# Patient Record
Sex: Female | Born: 1960 | Race: White | Hispanic: No
Health system: Midwestern US, Community
[De-identification: ages and names within clinical notes are randomized; demographics above are authoritative.]

## PROBLEM LIST (undated history)

## (undated) DIAGNOSIS — M19011 Primary osteoarthritis, right shoulder: Secondary | ICD-10-CM

## (undated) DIAGNOSIS — M26629 Arthralgia of temporomandibular joint, unspecified side: Secondary | ICD-10-CM

## (undated) DIAGNOSIS — K219 Gastro-esophageal reflux disease without esophagitis: Secondary | ICD-10-CM

## (undated) DIAGNOSIS — Z8711 Personal history of peptic ulcer disease: Secondary | ICD-10-CM

## (undated) DIAGNOSIS — Z8489 Family history of other specified conditions: Secondary | ICD-10-CM

## (undated) DIAGNOSIS — Z8719 Personal history of other diseases of the digestive system: Secondary | ICD-10-CM

## (undated) DIAGNOSIS — F32A Depression, unspecified: Secondary | ICD-10-CM

## (undated) DIAGNOSIS — I38 Endocarditis, valve unspecified: Secondary | ICD-10-CM

## (undated) DIAGNOSIS — R569 Unspecified convulsions: Secondary | ICD-10-CM

## (undated) DIAGNOSIS — C539 Malignant neoplasm of cervix uteri, unspecified: Secondary | ICD-10-CM

## (undated) DIAGNOSIS — K648 Other hemorrhoids: Secondary | ICD-10-CM

## (undated) DIAGNOSIS — D241 Benign neoplasm of right breast: Secondary | ICD-10-CM

## (undated) DIAGNOSIS — J449 Chronic obstructive pulmonary disease, unspecified: Secondary | ICD-10-CM

## (undated) DIAGNOSIS — I10 Essential (primary) hypertension: Secondary | ICD-10-CM

## (undated) DIAGNOSIS — M2560 Stiffness of unspecified joint, not elsewhere classified: Secondary | ICD-10-CM

## (undated) DIAGNOSIS — J189 Pneumonia, unspecified organism: Secondary | ICD-10-CM

## (undated) DIAGNOSIS — M19012 Primary osteoarthritis, left shoulder: Secondary | ICD-10-CM

## (undated) DIAGNOSIS — R413 Other amnesia: Secondary | ICD-10-CM

## (undated) DIAGNOSIS — F419 Anxiety disorder, unspecified: Secondary | ICD-10-CM

## (undated) DIAGNOSIS — Z972 Presence of dental prosthetic device (complete) (partial): Secondary | ICD-10-CM

## (undated) DIAGNOSIS — R1310 Dysphagia, unspecified: Secondary | ICD-10-CM

## (undated) DIAGNOSIS — M797 Fibromyalgia: Secondary | ICD-10-CM

## (undated) DIAGNOSIS — R011 Cardiac murmur, unspecified: Secondary | ICD-10-CM

## (undated) DIAGNOSIS — K589 Irritable bowel syndrome without diarrhea: Secondary | ICD-10-CM

## (undated) DIAGNOSIS — F329 Major depressive disorder, single episode, unspecified: Secondary | ICD-10-CM

## (undated) DIAGNOSIS — G8929 Other chronic pain: Secondary | ICD-10-CM

## (undated) DIAGNOSIS — R198 Other specified symptoms and signs involving the digestive system and abdomen: Secondary | ICD-10-CM

## (undated) DIAGNOSIS — M549 Dorsalgia, unspecified: Secondary | ICD-10-CM

## (undated) HISTORY — DX: Gastro-esophageal reflux disease without esophagitis: K21.9

## (undated) HISTORY — DX: Endocarditis, valve unspecified: I38

## (undated) HISTORY — DX: Essential (primary) hypertension: I10

## (undated) HISTORY — DX: Other chronic pain: G89.29

## (undated) HISTORY — PX: NM MYOVIEW LTD: HXRAD82

## (undated) HISTORY — DX: Chronic obstructive pulmonary disease, unspecified: J44.9

## (undated) HISTORY — DX: Depression, unspecified: F32.A

## (undated) HISTORY — DX: Major depressive disorder, single episode, unspecified: F32.9

## (undated) HISTORY — DX: Fibromyalgia: M79.7

## (undated) HISTORY — DX: Irritable bowel syndrome, unspecified: K58.9

## (undated) HISTORY — DX: Anxiety disorder, unspecified: F41.9

## (undated) HISTORY — DX: Dorsalgia, unspecified: M54.9

## (undated) HISTORY — PX: BREAST BIOPSY: SHX20

## (undated) HISTORY — PX: ABDOMINAL HYSTERECTOMY: SHX81

## (undated) HISTORY — DX: Benign neoplasm of right breast: D24.1

---

## 1987-11-10 HISTORY — PX: BREAST LUMPECTOMY: SHX2

## 1993-11-09 HISTORY — PX: CARDIAC CATHETERIZATION: SHX172

## 1999-04-03 ENCOUNTER — Ambulatory Visit (HOSPITAL_COMMUNITY): Admission: RE | Admit: 1999-04-03 | Discharge: 1999-04-03 | Payer: Self-pay | Admitting: Neurosurgery

## 1999-04-03 ENCOUNTER — Encounter: Payer: Self-pay | Admitting: Neurosurgery

## 2002-05-11 ENCOUNTER — Encounter: Payer: Self-pay | Admitting: Internal Medicine

## 2004-10-28 ENCOUNTER — Ambulatory Visit: Payer: Self-pay | Admitting: Internal Medicine

## 2004-11-04 ENCOUNTER — Ambulatory Visit: Payer: Self-pay | Admitting: Internal Medicine

## 2004-11-04 ENCOUNTER — Encounter: Payer: Self-pay | Admitting: Cardiology

## 2004-11-05 ENCOUNTER — Ambulatory Visit: Payer: Self-pay | Admitting: Internal Medicine

## 2004-12-16 ENCOUNTER — Ambulatory Visit: Payer: Self-pay | Admitting: Internal Medicine

## 2004-12-26 ENCOUNTER — Ambulatory Visit: Payer: Self-pay | Admitting: Internal Medicine

## 2004-12-29 ENCOUNTER — Ambulatory Visit: Payer: Self-pay | Admitting: Internal Medicine

## 2004-12-30 ENCOUNTER — Encounter: Admission: RE | Admit: 2004-12-30 | Discharge: 2004-12-30 | Payer: Self-pay | Admitting: Internal Medicine

## 2005-01-12 ENCOUNTER — Ambulatory Visit (HOSPITAL_COMMUNITY): Admission: RE | Admit: 2005-01-12 | Discharge: 2005-01-12 | Payer: Self-pay | Admitting: Internal Medicine

## 2005-02-04 ENCOUNTER — Ambulatory Visit: Payer: Self-pay | Admitting: Internal Medicine

## 2005-03-16 ENCOUNTER — Encounter: Payer: Self-pay | Admitting: Internal Medicine

## 2005-03-16 ENCOUNTER — Ambulatory Visit: Payer: Self-pay | Admitting: Internal Medicine

## 2005-03-17 ENCOUNTER — Ambulatory Visit: Payer: Self-pay | Admitting: Internal Medicine

## 2005-06-19 ENCOUNTER — Ambulatory Visit: Payer: Self-pay | Admitting: Internal Medicine

## 2005-09-28 ENCOUNTER — Ambulatory Visit: Payer: Self-pay | Admitting: Internal Medicine

## 2006-03-01 ENCOUNTER — Ambulatory Visit: Payer: Self-pay | Admitting: Internal Medicine

## 2006-03-19 ENCOUNTER — Ambulatory Visit: Payer: Self-pay | Admitting: Internal Medicine

## 2006-03-25 ENCOUNTER — Ambulatory Visit: Payer: Self-pay | Admitting: Internal Medicine

## 2006-05-18 ENCOUNTER — Ambulatory Visit: Payer: Self-pay | Admitting: Internal Medicine

## 2006-05-25 ENCOUNTER — Ambulatory Visit: Payer: Self-pay | Admitting: General Practice

## 2006-05-26 ENCOUNTER — Ambulatory Visit: Payer: Self-pay | Admitting: Internal Medicine

## 2006-06-01 ENCOUNTER — Encounter: Payer: Self-pay | Admitting: Cardiology

## 2006-06-01 ENCOUNTER — Ambulatory Visit: Payer: Self-pay | Admitting: Internal Medicine

## 2006-07-15 ENCOUNTER — Encounter: Payer: Self-pay | Admitting: Internal Medicine

## 2006-07-29 ENCOUNTER — Ambulatory Visit: Payer: Self-pay | Admitting: Internal Medicine

## 2006-09-09 ENCOUNTER — Ambulatory Visit: Payer: Self-pay | Admitting: Internal Medicine

## 2006-09-13 ENCOUNTER — Ambulatory Visit: Payer: Self-pay | Admitting: Cardiology

## 2006-09-13 ENCOUNTER — Encounter: Payer: Self-pay | Admitting: Internal Medicine

## 2006-10-15 ENCOUNTER — Encounter: Admission: RE | Admit: 2006-10-15 | Discharge: 2006-10-15 | Payer: Self-pay | Admitting: Neurosurgery

## 2006-11-17 ENCOUNTER — Inpatient Hospital Stay (HOSPITAL_COMMUNITY): Admission: RE | Admit: 2006-11-17 | Discharge: 2006-11-22 | Payer: Self-pay | Admitting: Neurosurgery

## 2006-11-17 HISTORY — PX: LAMINECTOMY WITH POSTERIOR LATERAL ARTHRODESIS LEVEL 3: SHX6337

## 2006-11-17 HISTORY — PX: HARDWARE REMOVAL: SHX979

## 2007-03-08 ENCOUNTER — Encounter: Payer: Self-pay | Admitting: Internal Medicine

## 2007-05-31 ENCOUNTER — Telehealth (INDEPENDENT_AMBULATORY_CARE_PROVIDER_SITE_OTHER): Payer: Self-pay | Admitting: *Deleted

## 2007-07-14 ENCOUNTER — Telehealth: Payer: Self-pay | Admitting: Internal Medicine

## 2007-07-19 ENCOUNTER — Encounter: Payer: Self-pay | Admitting: Internal Medicine

## 2007-08-08 ENCOUNTER — Ambulatory Visit: Payer: Self-pay | Admitting: Internal Medicine

## 2007-08-08 DIAGNOSIS — K219 Gastro-esophageal reflux disease without esophagitis: Secondary | ICD-10-CM | POA: Insufficient documentation

## 2007-08-08 DIAGNOSIS — J449 Chronic obstructive pulmonary disease, unspecified: Secondary | ICD-10-CM | POA: Insufficient documentation

## 2007-08-08 DIAGNOSIS — F3289 Other specified depressive episodes: Secondary | ICD-10-CM | POA: Insufficient documentation

## 2007-08-08 DIAGNOSIS — F329 Major depressive disorder, single episode, unspecified: Secondary | ICD-10-CM | POA: Insufficient documentation

## 2007-08-08 DIAGNOSIS — G40909 Epilepsy, unspecified, not intractable, without status epilepticus: Secondary | ICD-10-CM | POA: Insufficient documentation

## 2007-08-08 DIAGNOSIS — F339 Major depressive disorder, recurrent, unspecified: Secondary | ICD-10-CM | POA: Insufficient documentation

## 2007-08-09 ENCOUNTER — Telehealth (INDEPENDENT_AMBULATORY_CARE_PROVIDER_SITE_OTHER): Payer: Self-pay | Admitting: *Deleted

## 2007-09-07 DIAGNOSIS — F39 Unspecified mood [affective] disorder: Secondary | ICD-10-CM | POA: Insufficient documentation

## 2007-09-07 DIAGNOSIS — H409 Unspecified glaucoma: Secondary | ICD-10-CM | POA: Insufficient documentation

## 2007-09-07 DIAGNOSIS — F17200 Nicotine dependence, unspecified, uncomplicated: Secondary | ICD-10-CM | POA: Insufficient documentation

## 2007-09-07 DIAGNOSIS — K649 Unspecified hemorrhoids: Secondary | ICD-10-CM | POA: Insufficient documentation

## 2007-09-07 DIAGNOSIS — K259 Gastric ulcer, unspecified as acute or chronic, without hemorrhage or perforation: Secondary | ICD-10-CM | POA: Insufficient documentation

## 2007-09-08 ENCOUNTER — Telehealth (INDEPENDENT_AMBULATORY_CARE_PROVIDER_SITE_OTHER): Payer: Self-pay | Admitting: *Deleted

## 2007-09-09 ENCOUNTER — Ambulatory Visit: Payer: Self-pay | Admitting: Internal Medicine

## 2007-09-14 ENCOUNTER — Telehealth (INDEPENDENT_AMBULATORY_CARE_PROVIDER_SITE_OTHER): Payer: Self-pay | Admitting: *Deleted

## 2007-09-14 ENCOUNTER — Other Ambulatory Visit: Payer: Self-pay

## 2007-09-14 ENCOUNTER — Emergency Department: Payer: Self-pay | Admitting: Internal Medicine

## 2007-09-30 ENCOUNTER — Telehealth (INDEPENDENT_AMBULATORY_CARE_PROVIDER_SITE_OTHER): Payer: Self-pay | Admitting: *Deleted

## 2007-10-03 ENCOUNTER — Ambulatory Visit: Payer: Self-pay | Admitting: Internal Medicine

## 2007-10-04 ENCOUNTER — Encounter: Payer: Self-pay | Admitting: Internal Medicine

## 2007-10-04 ENCOUNTER — Ambulatory Visit: Payer: Self-pay | Admitting: Internal Medicine

## 2007-10-04 LAB — CONVERTED CEMR LAB
AST: 23 units/L (ref 0–37)
Alkaline Phosphatase: 90 units/L (ref 39–117)
BUN: 13 mg/dL (ref 6–23)
Basophils Relative: 0 % (ref 0.0–1.0)
CO2: 29 meq/L (ref 19–32)
Calcium: 9.7 mg/dL (ref 8.4–10.5)
Chloride: 100 meq/L (ref 96–112)
Creatinine, Ser: 0.8 mg/dL (ref 0.4–1.2)
Eosinophils Relative: 5.9 % — ABNORMAL HIGH (ref 0.0–5.0)
GFR calc non Af Amer: 82 mL/min
Lymphocytes Relative: 28.8 % (ref 12.0–46.0)
MCHC: 35.4 g/dL (ref 30.0–36.0)
Monocytes Absolute: 1.3 10*3/uL — ABNORMAL HIGH (ref 0.2–0.7)
RDW: 11.9 % (ref 11.5–14.6)
Sodium: 140 meq/L (ref 135–145)
TSH: 1.78 microintl units/mL (ref 0.35–5.50)
Total Bilirubin: 0.5 mg/dL (ref 0.3–1.2)
Total Protein: 7.6 g/dL (ref 6.0–8.3)
WBC: 13.2 10*3/uL — ABNORMAL HIGH (ref 4.5–10.5)

## 2007-10-10 ENCOUNTER — Encounter (INDEPENDENT_AMBULATORY_CARE_PROVIDER_SITE_OTHER): Payer: Self-pay | Admitting: *Deleted

## 2007-11-02 ENCOUNTER — Telehealth (INDEPENDENT_AMBULATORY_CARE_PROVIDER_SITE_OTHER): Payer: Self-pay | Admitting: *Deleted

## 2007-11-21 ENCOUNTER — Telehealth (INDEPENDENT_AMBULATORY_CARE_PROVIDER_SITE_OTHER): Payer: Self-pay | Admitting: *Deleted

## 2007-11-25 ENCOUNTER — Telehealth: Payer: Self-pay | Admitting: Internal Medicine

## 2007-12-08 ENCOUNTER — Ambulatory Visit: Payer: Self-pay | Admitting: Internal Medicine

## 2007-12-18 ENCOUNTER — Encounter: Payer: Self-pay | Admitting: Internal Medicine

## 2007-12-26 ENCOUNTER — Telehealth (INDEPENDENT_AMBULATORY_CARE_PROVIDER_SITE_OTHER): Payer: Self-pay | Admitting: *Deleted

## 2007-12-30 ENCOUNTER — Telehealth (INDEPENDENT_AMBULATORY_CARE_PROVIDER_SITE_OTHER): Payer: Self-pay | Admitting: *Deleted

## 2008-01-02 ENCOUNTER — Telehealth: Payer: Self-pay | Admitting: Internal Medicine

## 2008-01-23 ENCOUNTER — Telehealth (INDEPENDENT_AMBULATORY_CARE_PROVIDER_SITE_OTHER): Payer: Self-pay | Admitting: *Deleted

## 2008-01-29 ENCOUNTER — Other Ambulatory Visit: Payer: Self-pay

## 2008-01-30 ENCOUNTER — Observation Stay: Payer: Self-pay | Admitting: Internal Medicine

## 2008-01-31 ENCOUNTER — Encounter: Payer: Self-pay | Admitting: Internal Medicine

## 2008-02-01 ENCOUNTER — Encounter
Admission: RE | Admit: 2008-02-01 | Discharge: 2008-02-01 | Payer: Self-pay | Admitting: Physical Medicine & Rehabilitation

## 2008-02-02 ENCOUNTER — Ambulatory Visit: Payer: Self-pay | Admitting: Internal Medicine

## 2008-02-13 ENCOUNTER — Telehealth (INDEPENDENT_AMBULATORY_CARE_PROVIDER_SITE_OTHER): Payer: Self-pay | Admitting: *Deleted

## 2008-02-14 ENCOUNTER — Ambulatory Visit: Payer: Self-pay | Admitting: Family Medicine

## 2008-02-28 ENCOUNTER — Telehealth (INDEPENDENT_AMBULATORY_CARE_PROVIDER_SITE_OTHER): Payer: Self-pay | Admitting: *Deleted

## 2008-03-05 ENCOUNTER — Telehealth (INDEPENDENT_AMBULATORY_CARE_PROVIDER_SITE_OTHER): Payer: Self-pay | Admitting: *Deleted

## 2008-03-16 ENCOUNTER — Encounter: Payer: Self-pay | Admitting: Internal Medicine

## 2008-03-20 ENCOUNTER — Telehealth (INDEPENDENT_AMBULATORY_CARE_PROVIDER_SITE_OTHER): Payer: Self-pay | Admitting: *Deleted

## 2008-03-29 ENCOUNTER — Encounter: Payer: Self-pay | Admitting: Internal Medicine

## 2008-03-30 ENCOUNTER — Emergency Department: Payer: Self-pay | Admitting: Emergency Medicine

## 2008-04-05 ENCOUNTER — Ambulatory Visit: Payer: Self-pay | Admitting: Internal Medicine

## 2008-04-14 ENCOUNTER — Encounter: Payer: Self-pay | Admitting: Internal Medicine

## 2008-04-16 ENCOUNTER — Encounter: Payer: Self-pay | Admitting: Internal Medicine

## 2008-04-19 ENCOUNTER — Telehealth: Payer: Self-pay | Admitting: Family Medicine

## 2008-05-16 ENCOUNTER — Telehealth (INDEPENDENT_AMBULATORY_CARE_PROVIDER_SITE_OTHER): Payer: Self-pay | Admitting: *Deleted

## 2008-05-21 ENCOUNTER — Telehealth (INDEPENDENT_AMBULATORY_CARE_PROVIDER_SITE_OTHER): Payer: Self-pay | Admitting: *Deleted

## 2008-05-22 ENCOUNTER — Telehealth (INDEPENDENT_AMBULATORY_CARE_PROVIDER_SITE_OTHER): Payer: Self-pay | Admitting: *Deleted

## 2008-05-30 ENCOUNTER — Ambulatory Visit: Payer: Self-pay | Admitting: Pain Medicine

## 2008-05-30 ENCOUNTER — Telehealth (INDEPENDENT_AMBULATORY_CARE_PROVIDER_SITE_OTHER): Payer: Self-pay | Admitting: *Deleted

## 2008-06-02 ENCOUNTER — Ambulatory Visit: Payer: Self-pay | Admitting: Pain Medicine

## 2008-06-04 ENCOUNTER — Ambulatory Visit: Payer: Self-pay | Admitting: Pain Medicine

## 2008-06-26 ENCOUNTER — Telehealth: Payer: Self-pay | Admitting: Internal Medicine

## 2008-06-27 ENCOUNTER — Telehealth: Payer: Self-pay | Admitting: Internal Medicine

## 2008-06-27 ENCOUNTER — Ambulatory Visit: Payer: Self-pay | Admitting: Pain Medicine

## 2008-06-28 ENCOUNTER — Telehealth (INDEPENDENT_AMBULATORY_CARE_PROVIDER_SITE_OTHER): Payer: Self-pay | Admitting: *Deleted

## 2008-07-03 ENCOUNTER — Ambulatory Visit: Payer: Self-pay | Admitting: Pain Medicine

## 2008-07-06 ENCOUNTER — Encounter: Payer: Self-pay | Admitting: Internal Medicine

## 2008-07-06 ENCOUNTER — Emergency Department: Payer: Self-pay | Admitting: Emergency Medicine

## 2008-07-06 ENCOUNTER — Encounter: Payer: Self-pay | Admitting: Gastroenterology

## 2008-07-10 ENCOUNTER — Ambulatory Visit: Payer: Self-pay | Admitting: Internal Medicine

## 2008-07-11 ENCOUNTER — Telehealth: Payer: Self-pay | Admitting: Internal Medicine

## 2008-07-26 ENCOUNTER — Ambulatory Visit: Payer: Self-pay | Admitting: Physician Assistant

## 2008-07-31 ENCOUNTER — Ambulatory Visit: Payer: Self-pay | Admitting: Pain Medicine

## 2008-08-02 ENCOUNTER — Telehealth: Payer: Self-pay | Admitting: Internal Medicine

## 2008-08-14 ENCOUNTER — Encounter: Payer: Self-pay | Admitting: Internal Medicine

## 2008-08-15 ENCOUNTER — Ambulatory Visit: Payer: Self-pay | Admitting: Pain Medicine

## 2008-08-15 ENCOUNTER — Encounter: Payer: Self-pay | Admitting: Internal Medicine

## 2008-08-16 ENCOUNTER — Ambulatory Visit: Payer: Self-pay | Admitting: Pain Medicine

## 2008-08-17 ENCOUNTER — Ambulatory Visit: Payer: Self-pay | Admitting: Internal Medicine

## 2008-08-17 LAB — CONVERTED CEMR LAB
Bilirubin Urine: NEGATIVE
Glucose, Urine, Semiquant: NEGATIVE
Urobilinogen, UA: 0.2
pH: 6

## 2008-08-20 LAB — CONVERTED CEMR LAB
AST: 23 units/L (ref 0–37)
Albumin: 4.3 g/dL (ref 3.5–5.2)
BUN: 18 mg/dL (ref 6–23)
Basophils Absolute: 0.1 10*3/uL (ref 0.0–0.1)
Basophils Relative: 0.9 % (ref 0.0–3.0)
Creatinine, Ser: 0.9 mg/dL (ref 0.4–1.2)
GFR calc Af Amer: 86 mL/min
GFR calc non Af Amer: 71 mL/min
Hemoglobin: 13.7 g/dL (ref 12.0–15.0)
Lymphocytes Relative: 26.2 % (ref 12.0–46.0)
Neutro Abs: 7.6 10*3/uL (ref 1.4–7.7)
Platelets: 407 10*3/uL — ABNORMAL HIGH (ref 150–400)
Potassium: 4.1 meq/L (ref 3.5–5.1)
RDW: 13.4 % (ref 11.5–14.6)
Sodium: 138 meq/L (ref 135–145)
Total Protein: 7.7 g/dL (ref 6.0–8.3)
WBC: 11.8 10*3/uL — ABNORMAL HIGH (ref 4.5–10.5)

## 2008-08-30 ENCOUNTER — Telehealth: Payer: Self-pay | Admitting: Internal Medicine

## 2008-09-05 ENCOUNTER — Telehealth: Payer: Self-pay | Admitting: Internal Medicine

## 2008-09-06 ENCOUNTER — Ambulatory Visit: Payer: Self-pay | Admitting: Physician Assistant

## 2008-09-11 ENCOUNTER — Ambulatory Visit: Payer: Self-pay | Admitting: Pain Medicine

## 2008-09-19 ENCOUNTER — Ambulatory Visit: Payer: Self-pay | Admitting: Internal Medicine

## 2008-09-25 ENCOUNTER — Ambulatory Visit: Payer: Self-pay | Admitting: Pain Medicine

## 2008-09-25 ENCOUNTER — Telehealth: Payer: Self-pay | Admitting: Internal Medicine

## 2008-09-28 ENCOUNTER — Encounter: Payer: Self-pay | Admitting: Internal Medicine

## 2008-09-28 ENCOUNTER — Ambulatory Visit: Payer: Self-pay | Admitting: Internal Medicine

## 2008-09-28 HISTORY — PX: COLONOSCOPY: SHX5424

## 2008-09-28 HISTORY — PX: ESOPHAGOGASTRODUODENOSCOPY: SHX1529

## 2008-09-28 LAB — HM COLONOSCOPY

## 2008-10-05 ENCOUNTER — Ambulatory Visit: Payer: Self-pay | Admitting: Internal Medicine

## 2008-10-05 ENCOUNTER — Encounter: Payer: Self-pay | Admitting: Internal Medicine

## 2008-10-09 ENCOUNTER — Encounter (INDEPENDENT_AMBULATORY_CARE_PROVIDER_SITE_OTHER): Payer: Self-pay | Admitting: *Deleted

## 2008-10-29 ENCOUNTER — Telehealth: Payer: Self-pay | Admitting: Internal Medicine

## 2008-10-31 ENCOUNTER — Ambulatory Visit: Payer: Self-pay | Admitting: Family Medicine

## 2008-11-13 ENCOUNTER — Ambulatory Visit: Payer: Self-pay | Admitting: Internal Medicine

## 2008-11-13 ENCOUNTER — Encounter: Payer: Self-pay | Admitting: Internal Medicine

## 2008-11-13 DIAGNOSIS — K589 Irritable bowel syndrome without diarrhea: Secondary | ICD-10-CM | POA: Insufficient documentation

## 2008-11-14 ENCOUNTER — Ambulatory Visit: Payer: Self-pay | Admitting: Physician Assistant

## 2008-11-29 ENCOUNTER — Ambulatory Visit: Payer: Self-pay | Admitting: Internal Medicine

## 2008-12-14 ENCOUNTER — Emergency Department: Payer: Self-pay | Admitting: Emergency Medicine

## 2008-12-18 ENCOUNTER — Telehealth: Payer: Self-pay | Admitting: Internal Medicine

## 2008-12-19 ENCOUNTER — Telehealth: Payer: Self-pay | Admitting: Internal Medicine

## 2008-12-26 ENCOUNTER — Emergency Department: Payer: Self-pay | Admitting: Emergency Medicine

## 2008-12-27 ENCOUNTER — Telehealth: Payer: Self-pay | Admitting: Internal Medicine

## 2009-01-01 ENCOUNTER — Ambulatory Visit: Payer: Self-pay | Admitting: Internal Medicine

## 2009-01-09 ENCOUNTER — Emergency Department: Payer: Self-pay | Admitting: Internal Medicine

## 2009-01-28 ENCOUNTER — Encounter: Payer: Self-pay | Admitting: Internal Medicine

## 2009-02-21 ENCOUNTER — Telehealth: Payer: Self-pay | Admitting: Internal Medicine

## 2009-03-15 ENCOUNTER — Ambulatory Visit: Payer: Self-pay | Admitting: Neurology

## 2009-03-20 ENCOUNTER — Ambulatory Visit: Payer: Self-pay | Admitting: Neurology

## 2009-03-27 ENCOUNTER — Ambulatory Visit: Payer: Self-pay | Admitting: Internal Medicine

## 2009-03-27 DIAGNOSIS — I38 Endocarditis, valve unspecified: Secondary | ICD-10-CM | POA: Insufficient documentation

## 2009-04-11 ENCOUNTER — Encounter: Payer: Self-pay | Admitting: Internal Medicine

## 2009-04-12 ENCOUNTER — Telehealth: Payer: Self-pay | Admitting: Internal Medicine

## 2009-04-22 ENCOUNTER — Telehealth: Payer: Self-pay | Admitting: Internal Medicine

## 2009-04-29 ENCOUNTER — Encounter: Payer: Self-pay | Admitting: Internal Medicine

## 2009-06-14 ENCOUNTER — Telehealth: Payer: Self-pay | Admitting: Internal Medicine

## 2009-07-22 ENCOUNTER — Telehealth: Payer: Self-pay | Admitting: Internal Medicine

## 2009-08-15 ENCOUNTER — Telehealth: Payer: Self-pay | Admitting: Internal Medicine

## 2009-09-20 ENCOUNTER — Ambulatory Visit: Payer: Self-pay | Admitting: Internal Medicine

## 2009-09-23 ENCOUNTER — Ambulatory Visit: Payer: Self-pay | Admitting: Cardiology

## 2009-09-23 LAB — CONVERTED CEMR LAB
Alkaline Phosphatase: 87 units/L (ref 39–117)
BUN: 11 mg/dL (ref 6–23)
Bilirubin, Direct: 0 mg/dL (ref 0.0–0.3)
CO2: 29 meq/L (ref 19–32)
Calcium: 9.3 mg/dL (ref 8.4–10.5)
Eosinophils Absolute: 0.1 10*3/uL (ref 0.0–0.7)
GFR calc non Af Amer: 50.58 mL/min (ref >60)
HCT: 37.9 % (ref 36.0–46.0)
MCHC: 34.5 g/dL (ref 30.0–36.0)
MCV: 96.9 fL (ref 78.0–100.0)
Monocytes Absolute: 0.8 10*3/uL (ref 0.1–1.0)
Monocytes Relative: 8 % (ref 3.0–12.0)
Phosphorus: 4 mg/dL (ref 2.3–4.6)
RDW: 12.4 % (ref 11.5–14.6)
Sodium: 142 meq/L (ref 135–145)
TSH: 0.72 microintl units/mL (ref 0.35–5.50)
WBC: 10.6 10*3/uL — ABNORMAL HIGH (ref 4.5–10.5)

## 2009-09-25 ENCOUNTER — Encounter: Payer: Self-pay | Admitting: Cardiology

## 2009-10-09 ENCOUNTER — Telehealth (INDEPENDENT_AMBULATORY_CARE_PROVIDER_SITE_OTHER): Payer: Self-pay | Admitting: *Deleted

## 2009-10-10 ENCOUNTER — Encounter (HOSPITAL_COMMUNITY): Admission: RE | Admit: 2009-10-10 | Discharge: 2009-11-07 | Payer: Self-pay | Admitting: Cardiology

## 2009-10-10 ENCOUNTER — Encounter: Payer: Self-pay | Admitting: Cardiology

## 2009-10-10 ENCOUNTER — Ambulatory Visit: Payer: Self-pay | Admitting: Cardiology

## 2009-10-10 ENCOUNTER — Ambulatory Visit: Payer: Self-pay

## 2009-10-10 ENCOUNTER — Ambulatory Visit (HOSPITAL_COMMUNITY): Admission: RE | Admit: 2009-10-10 | Discharge: 2009-10-10 | Payer: Self-pay | Admitting: Cardiology

## 2009-10-10 ENCOUNTER — Telehealth: Payer: Self-pay | Admitting: Internal Medicine

## 2009-10-14 LAB — CONVERTED CEMR LAB
Pro B Natriuretic peptide (BNP): 12 pg/mL (ref 0.0–100.0)
Total CHOL/HDL Ratio: 6

## 2009-10-18 ENCOUNTER — Ambulatory Visit: Payer: Self-pay | Admitting: Internal Medicine

## 2009-10-18 ENCOUNTER — Encounter: Payer: Self-pay | Admitting: Internal Medicine

## 2009-10-23 ENCOUNTER — Ambulatory Visit: Payer: Self-pay | Admitting: Cardiology

## 2009-10-23 ENCOUNTER — Encounter: Payer: Self-pay | Admitting: Internal Medicine

## 2009-10-23 DIAGNOSIS — E785 Hyperlipidemia, unspecified: Secondary | ICD-10-CM | POA: Insufficient documentation

## 2009-10-27 ENCOUNTER — Emergency Department: Payer: Self-pay | Admitting: Emergency Medicine

## 2009-11-05 ENCOUNTER — Emergency Department: Payer: Self-pay | Admitting: Emergency Medicine

## 2009-11-12 ENCOUNTER — Ambulatory Visit: Payer: Self-pay | Admitting: Family Medicine

## 2009-11-12 ENCOUNTER — Encounter (INDEPENDENT_AMBULATORY_CARE_PROVIDER_SITE_OTHER): Payer: Self-pay | Admitting: Internal Medicine

## 2009-11-12 DIAGNOSIS — G8929 Other chronic pain: Secondary | ICD-10-CM

## 2009-11-12 HISTORY — DX: Other chronic pain: G89.29

## 2009-11-13 ENCOUNTER — Telehealth (INDEPENDENT_AMBULATORY_CARE_PROVIDER_SITE_OTHER): Payer: Self-pay | Admitting: *Deleted

## 2009-11-14 ENCOUNTER — Encounter: Admission: RE | Admit: 2009-11-14 | Discharge: 2009-11-14 | Payer: Self-pay | Admitting: Family Medicine

## 2009-11-15 LAB — CONVERTED CEMR LAB
Calcium: 9.4 mg/dL (ref 8.4–10.5)
Chloride: 103 meq/L (ref 96–112)
Creatinine, Ser: 1.1 mg/dL (ref 0.4–1.2)
Glucose, Bld: 123 mg/dL — ABNORMAL HIGH (ref 70–99)
Potassium: 4.2 meq/L (ref 3.5–5.1)
Sodium: 140 meq/L (ref 135–145)

## 2009-11-18 ENCOUNTER — Telehealth: Payer: Self-pay | Admitting: Internal Medicine

## 2009-11-22 ENCOUNTER — Ambulatory Visit: Admission: RE | Admit: 2009-11-22 | Discharge: 2009-11-22 | Payer: Self-pay | Admitting: Neurosurgery

## 2009-12-02 ENCOUNTER — Telehealth: Payer: Self-pay | Admitting: Internal Medicine

## 2009-12-06 ENCOUNTER — Inpatient Hospital Stay (HOSPITAL_COMMUNITY): Admission: RE | Admit: 2009-12-06 | Discharge: 2009-12-10 | Payer: Self-pay | Admitting: Neurosurgery

## 2009-12-06 HISTORY — PX: LAMINECTOMY WITH POSTERIOR LATERAL ARTHRODESIS LEVEL 3: SHX6337

## 2009-12-10 ENCOUNTER — Telehealth: Payer: Self-pay | Admitting: Internal Medicine

## 2009-12-17 ENCOUNTER — Encounter: Payer: Self-pay | Admitting: Internal Medicine

## 2009-12-23 ENCOUNTER — Ambulatory Visit: Payer: Self-pay | Admitting: Cardiovascular Disease

## 2009-12-23 ENCOUNTER — Encounter: Payer: Self-pay | Admitting: Cardiology

## 2009-12-25 ENCOUNTER — Ambulatory Visit: Payer: Self-pay | Admitting: Family Medicine

## 2009-12-27 LAB — CONVERTED CEMR LAB
AST: 21 units/L (ref 0–37)
Alkaline Phosphatase: 95 units/L (ref 39–117)
BUN: 10 mg/dL (ref 6–23)
CO2: 21 meq/L (ref 19–32)
Glucose, Bld: 94 mg/dL (ref 70–99)
LDL Cholesterol: 31 mg/dL (ref 0–99)
Potassium: 4.5 meq/L (ref 3.5–5.3)
Total Bilirubin: 0.2 mg/dL — ABNORMAL LOW (ref 0.3–1.2)
VLDL: 66 mg/dL — ABNORMAL HIGH (ref 0–40)

## 2010-01-01 ENCOUNTER — Telehealth: Payer: Self-pay | Admitting: Family Medicine

## 2010-01-09 ENCOUNTER — Encounter: Payer: Self-pay | Admitting: Family Medicine

## 2010-01-14 ENCOUNTER — Encounter: Payer: Self-pay | Admitting: Internal Medicine

## 2010-02-03 ENCOUNTER — Telehealth: Payer: Self-pay | Admitting: Internal Medicine

## 2010-03-05 ENCOUNTER — Ambulatory Visit: Payer: Self-pay | Admitting: Internal Medicine

## 2010-03-10 ENCOUNTER — Telehealth: Payer: Self-pay | Admitting: Internal Medicine

## 2010-03-17 ENCOUNTER — Encounter: Payer: Self-pay | Admitting: Internal Medicine

## 2010-04-28 ENCOUNTER — Encounter: Payer: Self-pay | Admitting: Internal Medicine

## 2010-06-02 ENCOUNTER — Ambulatory Visit: Payer: Self-pay | Admitting: Cardiology

## 2010-06-09 ENCOUNTER — Encounter: Payer: Self-pay | Admitting: Cardiology

## 2010-06-12 ENCOUNTER — Encounter: Payer: Self-pay | Admitting: Cardiovascular Disease

## 2010-06-16 ENCOUNTER — Ambulatory Visit: Payer: Self-pay | Admitting: Cardiology

## 2010-06-18 LAB — CONVERTED CEMR LAB
Calcium: 9.3 mg/dL (ref 8.4–10.5)
Chloride: 103 meq/L (ref 96–112)
Glucose, Bld: 178 mg/dL — ABNORMAL HIGH (ref 70–99)
Sodium: 137 meq/L (ref 135–145)

## 2010-06-19 ENCOUNTER — Ambulatory Visit: Payer: Self-pay | Admitting: Internal Medicine

## 2010-06-23 ENCOUNTER — Ambulatory Visit: Payer: Self-pay | Admitting: Internal Medicine

## 2010-06-23 DIAGNOSIS — I10 Essential (primary) hypertension: Secondary | ICD-10-CM | POA: Insufficient documentation

## 2010-07-15 ENCOUNTER — Encounter: Payer: Self-pay | Admitting: Cardiovascular Disease

## 2010-07-15 ENCOUNTER — Telehealth: Payer: Self-pay | Admitting: Cardiology

## 2010-07-30 ENCOUNTER — Telehealth: Payer: Self-pay | Admitting: Internal Medicine

## 2010-07-30 ENCOUNTER — Ambulatory Visit: Payer: Self-pay | Admitting: Internal Medicine

## 2010-07-31 ENCOUNTER — Encounter: Payer: Self-pay | Admitting: Internal Medicine

## 2010-08-04 ENCOUNTER — Telehealth (INDEPENDENT_AMBULATORY_CARE_PROVIDER_SITE_OTHER): Payer: Self-pay | Admitting: *Deleted

## 2010-08-13 ENCOUNTER — Encounter: Payer: Self-pay | Admitting: Internal Medicine

## 2010-08-14 ENCOUNTER — Telehealth (INDEPENDENT_AMBULATORY_CARE_PROVIDER_SITE_OTHER): Payer: Self-pay | Admitting: *Deleted

## 2010-08-26 ENCOUNTER — Telehealth: Payer: Self-pay | Admitting: Internal Medicine

## 2010-08-26 ENCOUNTER — Telehealth: Payer: Self-pay | Admitting: Cardiology

## 2010-08-26 ENCOUNTER — Encounter: Payer: Self-pay | Admitting: Cardiology

## 2010-08-28 ENCOUNTER — Encounter: Payer: Self-pay | Admitting: Cardiology

## 2010-09-22 ENCOUNTER — Telehealth: Payer: Self-pay | Admitting: Internal Medicine

## 2010-10-01 ENCOUNTER — Telehealth: Payer: Self-pay | Admitting: Family Medicine

## 2010-10-09 ENCOUNTER — Ambulatory Visit: Payer: Self-pay

## 2010-10-09 ENCOUNTER — Encounter: Payer: Self-pay | Admitting: Cardiology

## 2010-10-21 ENCOUNTER — Ambulatory Visit: Payer: Self-pay | Admitting: Internal Medicine

## 2010-10-21 DIAGNOSIS — G2581 Restless legs syndrome: Secondary | ICD-10-CM | POA: Insufficient documentation

## 2010-10-23 LAB — CONVERTED CEMR LAB
Albumin: 4.2 g/dL (ref 3.5–5.2)
Alkaline Phosphatase: 108 units/L (ref 39–117)
Basophils Relative: 0.4 % (ref 0.0–3.0)
Bilirubin, Direct: 0 mg/dL (ref 0.0–0.3)
Calcium: 9.8 mg/dL (ref 8.4–10.5)
Cholesterol: 140 mg/dL (ref 0–200)
Creatinine, Ser: 1 mg/dL (ref 0.4–1.2)
Eosinophils Absolute: 0.1 10*3/uL (ref 0.0–0.7)
Glucose, Bld: 69 mg/dL — ABNORMAL LOW (ref 70–99)
HCT: 38.3 % (ref 36.0–46.0)
LDL Cholesterol: 66 mg/dL (ref 0–99)
Lymphs Abs: 2.9 10*3/uL (ref 0.7–4.0)
MCHC: 34.4 g/dL (ref 30.0–36.0)
MCV: 93 fL (ref 78.0–100.0)
Monocytes Absolute: 0.8 10*3/uL (ref 0.1–1.0)
Neutrophils Relative %: 56.4 % (ref 43.0–77.0)
Platelets: 397 10*3/uL (ref 150.0–400.0)
RBC: 4.12 M/uL (ref 3.87–5.11)
Total Bilirubin: 0.2 mg/dL — ABNORMAL LOW (ref 0.3–1.2)
Total Protein: 7.5 g/dL (ref 6.0–8.3)

## 2010-11-19 ENCOUNTER — Encounter: Payer: Self-pay | Admitting: Internal Medicine

## 2010-11-19 ENCOUNTER — Ambulatory Visit: Payer: Self-pay | Admitting: Internal Medicine

## 2010-11-20 LAB — HM MAMMOGRAPHY: HM Mammogram: NORMAL

## 2010-11-21 ENCOUNTER — Encounter: Payer: Self-pay | Admitting: Internal Medicine

## 2010-12-01 ENCOUNTER — Encounter: Payer: Self-pay | Admitting: Cardiology

## 2010-12-01 ENCOUNTER — Ambulatory Visit
Admission: RE | Admit: 2010-12-01 | Discharge: 2010-12-01 | Payer: Self-pay | Source: Home / Self Care | Attending: Cardiology | Admitting: Cardiology

## 2010-12-01 DIAGNOSIS — R079 Chest pain, unspecified: Secondary | ICD-10-CM | POA: Insufficient documentation

## 2010-12-01 DIAGNOSIS — R0602 Shortness of breath: Secondary | ICD-10-CM | POA: Insufficient documentation

## 2010-12-02 ENCOUNTER — Telehealth: Payer: Self-pay | Admitting: Cardiology

## 2010-12-07 LAB — CONVERTED CEMR LAB
ALT: 12 units/L (ref 0–35)
AST: 23 units/L (ref 0–37)
Albumin: 4 g/dL (ref 3.5–5.2)
Alkaline Phosphatase: 90 units/L (ref 39–117)
Basophils Absolute: 0.1 10*3/uL (ref 0.0–0.1)
Bilirubin, Direct: 0.1 mg/dL (ref 0.0–0.3)
CO2: 31 meq/L (ref 19–32)
Calcium: 9.5 mg/dL (ref 8.4–10.5)
Eosinophils Absolute: 0.5 10*3/uL (ref 0.0–0.6)
Eosinophils Relative: 4.4 % (ref 0.0–5.0)
GFR calc non Af Amer: 71 mL/min
Hemoglobin: 13.8 g/dL (ref 12.0–15.0)
Lymphocytes Relative: 35.2 % (ref 12.0–46.0)
MCHC: 34.3 g/dL (ref 30.0–36.0)
MCV: 94.4 fL (ref 78.0–100.0)
Monocytes Relative: 7.7 % (ref 3.0–11.0)
Phosphorus: 4 mg/dL (ref 2.3–4.6)
Platelets: 369 10*3/uL (ref 150–400)
Potassium: 4.1 meq/L (ref 3.5–5.1)
Sodium: 143 meq/L (ref 135–145)
Total Bilirubin: 0.6 mg/dL (ref 0.3–1.2)

## 2010-12-08 ENCOUNTER — Telehealth: Payer: Self-pay | Admitting: Internal Medicine

## 2010-12-09 ENCOUNTER — Encounter (HOSPITAL_COMMUNITY): Admission: RE | Admit: 2010-12-09 | Payer: Self-pay | Source: Home / Self Care | Admitting: Cardiology

## 2010-12-09 ENCOUNTER — Ambulatory Visit: Admit: 2010-12-09 | Payer: Self-pay

## 2010-12-11 NOTE — Medication Information (Signed)
Summary: Tax adviser   Imported By: [place] 06/12/2010 15:49:13  _____________________________________________________________________  External Attachment:    Type:   Image     Comment:   External Document

## 2010-12-11 NOTE — Assessment & Plan Note (Signed)
Summary: Pulmonary/ ext f/u ov with walking sats = ok   Copy to:  Dr. Shirlee Latch Primary Alix Lahmann/Referring Arling Cerone:  Tillman Abide, MD  CC:  4 wk followup.  Still smoking.  Pt states that the cough is "better at times" but still "coughs all day long"- prod with minimal clear sputum.  Breathing is the same- no better or worse. No new complaints today.Molly Peters  History of Present Illness: 67 yowf longterm smoker dx copd and bronchitis x around 2009   June 23, 2010  1st pulmonary office on ACE  eval cc sob x 1-2 years getting worse to point where has to stop housework after 20 min due to back and breathing plus variable intensity  cough dry and worse day than night, not typically awakening her or exac in ams.  Assoc with severe HB taking as needed prilosec otc.     Tried spiriva/ adair (worse)  Stop smoking before it stops you or symptoms progress stop enalapril Start benicar 2012.5  one each am (take one half if too strong) Stop fish oil Take Prilosec 20 mg Take  one 30-60 min before first meal of the day until no longer having heartburn   July 30, 2010 4 wk followup.  Still smoking.  Pt states that the cough is "better at times" but still "coughs all day long"- prod with minimal clear sputum.  Breathing is the same- no better or worse off all inhalers. sleeping ok.  No new complaints today. Pt denies any significant sore throat, dysphagia, itching, sneezing,  nasal congestion or excess secretions,  fever, chills, sweats, unintended wt loss, pleuritic or exertional cp, hempoptysis, variability in activity tolerance  orthopnea pnd or leg swelling. Pt also denies any obvious fluctuation in symptoms with weather or environmental change or other alleviating or aggravating factors.         Current Medications (verified): 1)  Lamictal 100 Mg Tabs (Lamotrigine) .Molly Peters.. 1 By Mouth Two Times A Day 2)  Paroxetine Hcl 20 Mg  Tabs (Paroxetine Hcl) .... Take One By Mouth Once A Day 3)  Alprazolam 1 Mg  Tabs (Alprazolam) .Molly Peters.. 1 Two Times A Day As Needed For Anxiety 4)  Cyclobenzaprine Hcl 5 Mg Tabs (Cyclobenzaprine Hcl) .Molly Peters.. 1 Three Times A Day As Needed  For Fibromyalgia 5)  Simvastatin 20 Mg Tabs (Simvastatin) .... Take One Tablet By Mouth Daily At Bedtime 6)  Percocet 7.5-325 Mg Tabs (Oxycodone-Acetaminophen) .Molly Peters.. 1 Two Times A Day As Needed 7)  Prilosec Otc 20 Mg Tbec (Omeprazole Magnesium) .... Take  One 30-60 Min Before First Meal of The Day 8)  Benicar Hct 20-12.5 Mg  Tabs (Olmesartan Medoxomil-Hctz) .... One Tablet By Mouth Daily  Allergies (verified): 1)  * Depakote 2)  * Benadryl 3)  * Klonopin 4)  Tegretol 5)  Spiriva Handihaler (Tiotropium Bromide Monohydrate)  Past History:  Past Medical History: 1.  COPD: smokes 1 ppd      - PFT's 06/19/10 FEV1 1.97 (91%) ratio 73 and no better after B2,  DLC0 57% 2. Depression 3. GERD 4. Seizure disorder 5. Valvular heart disease: Initial workup a number of years ago at [facility].  Echo (10/2009) showed EF 60-65%, normal LV size, moderate aortic insufficiency with a trileaflet aortic valve and normal aortic root size.  Mild mitral regurgitation.  6. Anxiety/Panic disorder 7. Glaucoma 8. Hemorrhoids 9. Fibromyalgia 10. Anal Fissure 11. Arthritis 12. Chronic Headaches 13. Hypertension 14. IBS 15. Lexiscan myoview (10/2009): EF 77%, normal wall motion, normal perfusion.  16.  History of lumbar spine fusion.   Vital Signs:  Patient profile:   50 year old female Weight:      113 pounds O2 Sat:      95 % on Room air Temp:     97.7 degrees F oral Pulse rate:   97 / minute BP sitting:   110 / 80  (left arm)  Vitals Entered By: Vernie Murders (July 30, 2010 8:59 AM)  O2 Flow:  Room air  Serial Vital Signs/Assessments:  Comments: 9:32 AM Ambulatory Pulse Oximetry  Resting; HR__99___    02 Sat___98%ra__  Lap1 (185 feet)   HR__109___   02 Sat__92%ra___ Lap2 (185 feet)   HR__112___   02 Sat__97%ra___    Lap3 (185 feet)    HR_____   02 Sat_____  ___Test Completed without Difficulty _x__Test Stopped due to: pt c/o SOB   By: Vernie Murders    Physical Exam  Additional Exam:  amb wf nad no longer with barking cough wt 114 > 115 June 23, 2010 > 113 July 30, 2010  HEENT mild turbinate edema.  Oropharynx no thrush or excess pnd or cobblestoning.  No JVD or cervical adenopathy. Mild accessory muscle hypertrophy. Trachea midline, nl thryroid. Chest was hyperinflated by percussion with diminished breath sounds and moderate increased exp time without wheeze. Hoover sign positive at mid inspiration. Regular rate and rhythm without murmur gallop or rub or increase P2 or edema.  Abd: no hsm, nl excursion. Ext warm without cyanosis or clubbing.      Impression & Recommendations:  Problem # 1:  COUGH (ICD-786.2)  Improving off ace despite smoking.  No evidence of sign copd, no flare off inhalers  most likely this is  Classic Upper airway cough syndrome, so named because it's frequently impossible to sort out how much is  CR/sinusitis with freq throat clearing (which can be related to primary GERD)   vs  causing  secondary extra esophageal GERD from wide swings in gastric pressure that occur with throat clearing, promoting self use of mint and menthol lozenges that reduce the lower esophageal sphincter tone and exacerbate the problem further These are the same pts who not infrequently have failed to tolerate ace inhibitors,  dry powder inhalers or biphosphonates or report having reflux symptoms that don't respond to standard doses of PPI  therefore continue PPi, diet and off ace  Problem # 2:  HYPERTENSION (ICD-401.9)  ok on ARB/ off ace - longterm rx per Dr Alphonsus Sias  Her updated medication list for this problem includes:    Benicar Hct 20-12.5 Mg Tabs (Olmesartan medoxomil-hctz) ..... One tablet by mouth daily  Orders: Est. Patient Level III ([address])  Problem # 3:  TOBACCO ABUSE (ICD-305.1)  I emphasized  that although we never turn away smokers from the pulmonary clinic, we do ask that they understand that the recommendations that were made won't work nearly as well in the presence of continued cigarette exposure and we may reach a point where we can't help the patient if he/she can't quit smoking.   Medications Added to Medication List This Visit: 1)  Percocet 7.5-325 Mg Tabs (Oxycodone-acetaminophen) .Molly Peters.. 1 two times a day as needed  Other Orders: Pulse Oximetry, Ambulatory ([address])  Patient Instructions: 1)  Stop smoking before it stops you or symptoms will  progress 2)  Continue benicar 2012.5  one each am (take one half if too strong) 3)  Take Prilosec 20 mg Take  one 30-60 min before first meal of the day  for a full 6 weeks if cough not better we'd be happy to see you again 4)  GERD (REFLUX)  is a common cause of respiratory symptoms. It commonly presents without heartburn and can be treated with medication, but also with lifestyle changes including avoidance of late meals, excessive alcohol, smoking cessation, and avoid fatty foods, chocolate, peppermint, colas, red wine, and acidic juices such as orange juice. NO MINT OR MENTHOL PRODUCTS SO NO COUGH DROPS  5)  USE SUGARLESS CANDY INSTEAD (jolley ranchers)  6)  NO OIL BASED VITAMINS  7)  for cough take delsym as needed 8)  We never turn away smokers from the pulmonary clinic, we do ask that they understand that the recommendations that were made won't work nearly as well in the presence of continued cigarette exposure and we may reach a point where we can't promise to help (although we'll certainly try) 9)  No problem with having dental surgery from a pulmonary perspective Prescriptions: BENICAR HCT 20-12.5 MG  TABS (OLMESARTAN MEDOXOMIL-HCTZ) One tablet by mouth daily  #90 x 0   Entered by:   Vernie Murders   Authorized by:   Nyoka Cowden MD   Signed by:   Vernie Murders on 07/30/2010   Method used:   Electronically to         [facility]* (mail-order)       [address]       [place], [place]  [address]       Ph: [phone]       Fax: [phone]   RxID:   [id number]    Immunization History:  Pneumovax Immunization History:    Pneumovax:  historical (10/09/2009)

## 2010-12-11 NOTE — Progress Notes (Signed)
Summary: needs order for mammogram  Phone Note Call from Patient Call back at Home Phone [phone]   Caller: Patient Call For: Cindee Salt MD Summary of Call: Pt needs order for mammogram, she goes to [place]. Initial call taken by: [name] CMA, AAMA,  September 22, 2010 2:48 PM  Follow-up for Phone Call        order sent due in December Follow-up by: Cindee Salt MD,  September 22, 2010 2:56 PM

## 2010-12-11 NOTE — Progress Notes (Signed)
Summary: ok to fax note?  Phone Note Call from Patient   Caller: Patient Call For: Cindee Salt MD Summary of Call: Pt is having oral surgery and the surgeon is asking for a copy of her last physical note.  Ok to faxe note to them?  Last physical was 09/20/09. Initial call taken by: [name] CMA,  August 26, 2010 4:05 PM  Follow-up for Phone Call        Just please confirm with the patient before faxing Cindee Salt MD  August 26, 2010 7:57 PM   Additional Follow-up for Phone Call Additional follow up Details #1::        Patient requested that the note be faxed.  Note faxed. Additional Follow-up by: [name] CMA,  August 27, 2010 8:04 AM

## 2010-12-11 NOTE — Progress Notes (Signed)
Summary: cyclobenzaprine   Phone Note Refill Request Message from:  Fax from Pharmacy on July 30, 2010 11:45 AM  Refills Requested: Medication #1:  CYCLOBENZAPRINE HCL 5 MG TABS 1 three times a day as needed  for fibromyalgia refill request from [facility]- [phone] Form is on your desk.    Follow-up for Phone Call        okay #270 x 1 Follow-up by: Cindee Salt MD,  July 30, 2010 1:50 PM  Additional Follow-up for Phone Call Additional follow up Details #1::        Rx faxed to pharmacy Additional Follow-up by: DeShannon Smith CMA Duncan Dull),  July 30, 2010 2:27 PM    Prescriptions: CYCLOBENZAPRINE HCL 5 MG TABS (CYCLOBENZAPRINE HCL) 1 three times a day as needed  for fibromyalgia  #270 x 1   Entered by:   Mervin Hack CMA (AAMA)   Authorized by:   Cindee Salt MD   Signed by:   Mervin Hack CMA (AAMA) on 07/30/2010   Method used:   Handwritten   RxID:   [id number]

## 2010-12-11 NOTE — Progress Notes (Signed)
Summary: Benicar need PA > change to Losartan 100/12/5  Phone Note Call from Patient Call back at Home Phone [phone]   Caller: Patient Call For: wert Summary of Call: pt says that [facility] her stating that they need prior auth for benicar. note: pt confirmed that this should be [facility] not [facility] Initial call taken by: Tivis Ringer, CNA,  August 14, 2010 9:30 AM  Follow-up for Phone Call        called and spoke with pt.  pt gave me the phone # for [facility] as [phone]. However, when i called this # it is the # for [facility].  Which is exactly who Verlon Au called in the rx for per phone message on 08/04/2010.  Informed pt of this.  However, pt states she got a call from [facility] this morning stating Benicar requires a prior auth.  Informed pt I would call [facility] and get info for Prior Auth.   I attempted to call [facility] x 3 and got hung up on each time!!!!! Will try back later Arman Filter LPN  August 14, 2010 10:00 AM   Additional Follow-up for Phone Call Additional follow up Details #1::        called [facility] again and was able to get a pharmacist, Kennon Rounds, who informed me Benicar HCT is not covered by pt's ins.  An alternative that ins will cover is Losartan HCT.  Will forward message to MW to address if ok to switch pt to the Losartan HCT or if he would like [place] to go ahead with a PA for the Benicar HCT.  Aundra Millet Reynolds LPN  August 14, 2010 5:00 PM  ok to rx with losartan 100/12.5 Additional Follow-up by: Nyoka Cowden MD,  August 14, 2010 5:17 PM    Additional Follow-up for Phone Call Additional follow up Details #2::    called [facility] and changed benicar/hct to losartan/hct.  called and spoke with pt.  pt is aware of the change.  Megan Reynolds LPN  August 15, 2010 9:41 AM   New/Updated Medications: LOSARTAN POTASSIUM-HCTZ 100-12.5  MG TABS (LOSARTAN POTASSIUM-HCTZ) Take 1 tablet by mouth once a day Prescriptions: LOSARTAN POTASSIUM-HCTZ 100-12.5 MG TABS (LOSARTAN POTASSIUM-HCTZ) Take 1 tablet by mouth once a day  #90 x 3   Entered by:   Arman Filter LPN   Authorized by:   Nyoka Cowden MD   Signed by:   Arman Filter LPN on 52/84/1324   Method used:   Telephoned to ...       [facility]* (retail)       [address] [place], [place]  [address]       Ph: [phone]       Fax: [phone]   RxID:   [id number]

## 2010-12-11 NOTE — Letter (Signed)
Summary: Clearance Letter  Architectural technologist at [facility] [address]   [place], [place] [address]   Phone: [phone]  Fax: [phone]    August 26, 2010  Re:     Molly Peters Address:   [address]     [place], [place]  [address] DOB:     14-Oct-1961 MRN:     [id number]   Dear Molly Peters,  The above named patient has cardiac clearance for multiple teeth extraction under general anesthesia.        Sincerely,       Marca Ancona, MD

## 2010-12-11 NOTE — Progress Notes (Signed)
Summary: Rx Alprazolam  Phone Note Refill Request Call back at [phone] Message from:  [facility] on December 10, 2009 9:38 AM  Refills Requested: Medication #1:  ALPRAZOLAM 1 MG TABS 1 two times a day as needed for anxiety   Last Refilled: 11/07/2009 Form on your desk    Method Requested: Electronic Initial call taken by: Linde Gillis CMA Duncan Dull),  December 10, 2009 9:38 AM  Follow-up for Phone Call        okay #60 x 1 Follow-up by: Cindee Salt MD,  December 10, 2009 1:47 PM  Additional Follow-up for Phone Call Additional follow up Details #1::        Rx faxed to pharmacy Additional Follow-up by: DeShannon Smith CMA Duncan Dull),  December 10, 2009 3:03 PM    Prescriptions: ALPRAZOLAM 1 MG TABS (ALPRAZOLAM) 1 two times a day as needed for anxiety  #60 x 1   Entered by:   Mervin Hack CMA (AAMA)   Authorized by:   Cindee Salt MD   Signed by:   Mervin Hack CMA (AAMA) on 12/10/2009   Method used:   Handwritten   RxID:   [id number]

## 2010-12-11 NOTE — Progress Notes (Signed)
Summary: Wants referral  Phone Note Call from Patient Call back at [phone]   Caller: Patient Call For: Molly Salt MD Summary of Call: pt is calling wanting a referral for a new neurosurgeon, pt was seeing Dr. Coletta Memos, and now pt states they don't accept [facility]. Pt states she is having pain in her left leg and across her back. Pt states the last time she had cysts on her spine. She does have an appt with Dr. Franky Macho on 11/19/2009 but pt states she will cancel because of insurance. I advised pt that I must get auth from Dr. Alphonsus Sias before this can be done and that Dr. Alphonsus Sias will not be in until Monday, she said ok. Initial call taken by: Mervin Hack CMA Duncan Dull),  November 13, 2009 11:15 AM  Follow-up for Phone Call        please try to get her seen as soon as possible with someone on her plan--had MRI 11/14/2009 with new cyst and degenerative changes  Billie-Lynn Tyler Deis FNP  November 15, 2009 7:38 AM  Order done  Follow-up by: Molly Salt MD,  November 15, 2009 9:05 AM  Additional Follow-up for Phone Call Additional follow up Details #1::        In [place]...cdavis

## 2010-12-11 NOTE — Progress Notes (Signed)
Summary: needs written scripts for mail order  Phone Note Refill Request Call back at Home Phone [phone] Message from:  Patient  Refills Requested: Medication #1:  LAMICTAL 100 MG TABS 1 by mouth two times a day  Medication #2:  PAROXETINE HCL 20 MG  TABS Take one by mouth once a day  Medication #3:  SIMVASTATIN 20 MG TABS Take one tablet by mouth daily at bedtime  Medication #4:  ENALAPRIL MALEATE 2.5 MG  TABS Take 1 tablet by mouth two times a day Pt needs written scripts for mail order.  She also want flexeril.  She says she takes 20 mg's three times a day but her chart says 5 mg's three times a day .  Please call when ready.  Initial call taken by: [name] CMA,  Mar 10, 2010 3:18 PM  Follow-up for Phone Call        Rx written Follow-up by: Cindee Salt MD,  Mar 11, 2010 7:48 AM  Additional Follow-up for Phone Call Additional follow up Details #1::        cancelled rx's sent to [facility], re-printed for Dr. Alphonsus Sias to sign. Spoke with patient and advised rx ready for pick-up  Additional Follow-up by: DeShannon Smith CMA Duncan Dull),  Mar 11, 2010 8:59 AM    Prescriptions: SIMVASTATIN 20 MG TABS (SIMVASTATIN) Take one tablet by mouth daily at bedtime  #90 x 3   Entered by:   Mervin Hack CMA (AAMA)   Authorized by:   Cindee Salt MD   Signed by:   Mervin Hack CMA (AAMA) on 03/11/2010   Method used:   Print then Give to Patient   RxID:   [id number] CYCLOBENZAPRINE HCL 5 MG TABS (CYCLOBENZAPRINE HCL) 1 three times a day as needed  for fibromyalgia  #270 x 1   Entered by:   Mervin Hack CMA (AAMA)   Authorized by:   Cindee Salt MD   Signed by:   Mervin Hack CMA (AAMA) on 03/11/2010   Method used:   Print then Give to Patient   RxID:   [id number] ENALAPRIL MALEATE 2.5 MG  TABS (ENALAPRIL MALEATE) Take 1 tablet by mouth two times a day  #180 x 3   Entered by:   Mervin Hack CMA (AAMA)   Authorized by:   Cindee Salt  MD   Signed by:   Mervin Hack CMA (AAMA) on 03/11/2010   Method used:   Print then Give to Patient   RxID:   [id number] PAROXETINE HCL 20 MG  TABS (PAROXETINE HCL) Take one by mouth once a day  #90 x 3   Entered by:   Mervin Hack CMA (AAMA)   Authorized by:   Cindee Salt MD   Signed by:   Mervin Hack CMA (AAMA) on 03/11/2010   Method used:   Print then Give to Patient   RxID:   [id number] LAMICTAL 100 MG TABS (LAMOTRIGINE) 1 by mouth two times a day  #180 x 3   Entered by:   Mervin Hack CMA (AAMA)   Authorized by:   Cindee Salt MD   Signed by:   Mervin Hack CMA (AAMA) on 03/11/2010   Method used:   Print then Give to Patient   RxID:   [id number] SIMVASTATIN 20 MG TABS (SIMVASTATIN) Take one tablet by mouth daily at bedtime  #90 x 3   Entered and Authorized by:   Cindee Salt MD  Signed by:   Cindee Salt MD on 03/11/2010   Method used:   Electronically to        [facility] [phone]* (retail)       [address] [place], [place]  [address]       Ph: [phone]       Fax: [phone]   RxID:   [id number] CYCLOBENZAPRINE HCL 5 MG TABS (CYCLOBENZAPRINE HCL) 1 three times a day as needed  for fibromyalgia  #270 x 1   Entered and Authorized by:   Cindee Salt MD   Signed by:   Cindee Salt MD on 03/11/2010   Method used:   Electronically to        [facility] [phone]* (retail)       [address] [place], [place]  [address]       Ph: [phone]       Fax: [phone]   RxID:   [id number] ENALAPRIL MALEATE 2.5 MG  TABS (ENALAPRIL MALEATE) Take 1 tablet by mouth two times a day  #180 x 3   Entered and Authorized by:   Cindee Salt MD   Signed by:   Cindee Salt MD on 03/11/2010   Method used:   Electronically to        [facility] [phone]* (retail)       [address] [place], [place]  [address]       Ph: [phone]       Fax:  [phone]   RxID:   [id number] PAROXETINE HCL 20 MG  TABS (PAROXETINE HCL) Take one by mouth once a day  #90 x 3   Entered and Authorized by:   Cindee Salt MD   Signed by:   Cindee Salt MD on 03/11/2010   Method used:   Electronically to        [facility] [phone]* (retail)       [address] [place], [place]  [address]       Ph: [phone]       Fax: [phone]   RxID:   [id number] LAMICTAL 100 MG TABS (LAMOTRIGINE) 1 by mouth two times a day  #180 x 3   Entered and Authorized by:   Cindee Salt MD   Signed by:   Cindee Salt MD on 03/11/2010   Method used:   Electronically to        [facility] [phone]* (retail)       [address] [place], [place]  [address]       Ph: [phone]       Fax: [phone]   RxID:   [id number]

## 2010-12-11 NOTE — Letter (Signed)
Summary: Results Follow up Letter  [facility]  [address] [place], [place] [address]   Phone: [phone]  Fax: [phone]    11/21/2010 MRN: [id number]  Molly Peters [address] [place], [place]  [address]  Dear Ms. LLAMAS,  The following are the results of your recent test(s):  Test         Result    Pap Smear:        Normal _____  Not Normal _____ Comments: ______________________________________________________ Cholesterol: LDL(Bad cholesterol):         Your goal is less than:         HDL (Good cholesterol):       Your goal is more than: Comments:  ______________________________________________________ Mammogram:        Normal __X___  Not Normal _____ Comments:mammo is fine repeat recommended in 1-2 years   ___________________________________________________________________ Hemoccult:        Normal _____  Not normal _______ Comments:    _____________________________________________________________________ Other Tests:    We routinely do not discuss normal results over the telephone.  If you desire a copy of the results, or you have any questions about this information we can discuss them at your next office visit.   Sincerely,      Tillman Abide, MD

## 2010-12-11 NOTE — Assessment & Plan Note (Signed)
Summary: Pulmonary consyultation/ copd mild with severe symptoms ? ace ?   Copy to:  Dr. Shirlee Latch Primary Prarthana Parlin/Referring Tiegan Terpstra:  Tillman Abide, MD  CC:  Pulmonary Consult.Marland Kitchen  History of Present Illness: 22 yowf longterm smoker dx copd and bronchitis x around 2009   June 23, 2010  1st pulmonary office on ACE  eval cc sob x 1-2 years getting worse to point where has to stop housework after 20 min due to back and breathing plus variable intensity  cough dry and worse day than night, not typically awakening her or exac in ams.  Asso with severe HB taking as needed prilosec otc.  Pt denies any significant sore throat, dysphagia, itching, sneezing,  nasal congestion or excess secretions,  fever, chills, sweats, unintended wt loss, classically  pleuritic or exertional cp, hempoptysis, change in activity tolerance  orthopnea pnd or leg swelling. Pt also denies any obvious fluctuation in symptoms with weather or environmental change or other alleviating or aggravating factors.      Tried spiriva/ adair (worse)  Current Medications (verified): 1)  Lamictal 100 Mg Tabs (Lamotrigine) .Marland Kitchen.. 1 By Mouth Two Times A Day 2)  Paroxetine Hcl 20 Mg  Tabs (Paroxetine Hcl) .... Take One By Mouth Once A Day 3)  Enalapril Maleate 5 Mg Tabs (Enalapril Maleate) .... One Tablet Two Times A Day 4)  Alprazolam 1 Mg Tabs (Alprazolam) .Marland Kitchen.. 1 Two Times A Day As Needed For Anxiety 5)  Cyclobenzaprine Hcl 5 Mg Tabs (Cyclobenzaprine Hcl) .Marland Kitchen.. 1 Three Times A Day As Needed  For Fibromyalgia 6)  Simvastatin 20 Mg Tabs (Simvastatin) .... Take One Tablet By Mouth Daily At Bedtime 7)  Percocet 5-325 Mg Tabs (Oxycodone-Acetaminophen) .... Take 1 By Mouth Two Times A Day 8)  Fish Oil 1000 Mg Caps (Omega-3 Fatty Acids) .... Take 2 Tablet By Mouth Once A Day  Allergies (verified): 1)  * Depakote 2)  * Benadryl 3)  * Klonopin 4)  Tegretol 5)  Spiriva Handihaler (Tiotropium Bromide Monohydrate)  Past History:  Past  Medical History: 1.  COPD: smokes 1 ppd      - PFT's 06/19/10 FEV1 1.97 (91%) ratio 73 and no better after B2,  DLC0 57% 2. Depression 3. GERD 4. Seizure disorder 5. Valvular heart disease: Initial workup a number of years ago at [facility].  Echo (10/2009) showed EF 60-65%, normal LV size, moderate aortic insufficiency with a trileaflet aortic valve and normal aortic root size.  Mild mitral regurgitation.  6. Anxiety/Panic disorder 7. Glaucoma 8. Hemorrhoids 9. Fibromyalgia 10. Anal Fissure 11. Arthritis 12. Chronic Headaches 13. Hypertension 14. IBS 15. Lexiscan myoview (10/2009): EF 77%, normal wall motion, normal perfusion.  16. History of lumbar spine fusion.   Family History: Dad fairly healthy Mom with rhematoid arthritis Brother and sister with migraines CAD in both sets of grandparents DM and HTN  in both sides also Family History of Colon Cancer:uncle Family History of Breast Cancer: aunts (multiple) Family History of Ovarian Cancer:great aunt Family History of Stomach Cancer:? Family History of Colitis/Crohn's/IBS :sister (likely IBS) asthma-daughter Cervical Cancer - mother and sister  Social History: Current Smoker, 1ppd x38 yrs Divorced then remarried 2001 Children:3 Alcohol use-no Disabled since 2003 Daily Caffeine Use Marijuana use-occ  Review of Systems       The patient complains of shortness of breath with activity, shortness of breath at rest, productive cough, non-productive cough, chest pain, irregular heartbeats, acid heartburn, indigestion, abdominal pain, tooth/dental problems, anxiety, depression, and joint stiffness or  pain.  The patient denies coughing up blood, loss of appetite, weight change, difficulty swallowing, sore throat, headaches, nasal congestion/difficulty breathing through nose, sneezing, itching, ear ache, hand/feet swelling, rash, change in color of mucus, and fever.    Vital Signs:  Patient profile:   50 year old female Height:       60 inches Weight:      115 pounds BMI:     22.54 O2 Sat:      98 % on Room air Temp:     98.3 degrees F oral Pulse rate:   82 / minute BP sitting:   138 / 92  (left arm) Cuff size:   regular  Vitals Entered By: Gweneth Dimitri RN (June 23, 2010 11:09 AM)  O2 Flow:  Room air CC: Pulmonary Consult. Comments Medications reviewed with patient Daytime contact number verified with patient. Gweneth Dimitri RN  June 23, 2010 11:10 AM    Physical Exam  Additional Exam:  anxious wf with harsh barking upper airway cough wt 114 > 115 June 23, 2010 HEENT mild turbinate edema.  Oropharynx no thrush or excess pnd or cobblestoning.  No JVD or cervical adenopathy. Mild accessory muscle hypertrophy. Trachea midline, nl thryroid. Chest was hyperinflated by percussion with diminished breath sounds and moderate increased exp time without wheeze. Hoover sign positive at mid inspiration. Regular rate and rhythm without murmur gallop or rub or increase P2 or edema.  Abd: no hsm, nl excursion. Ext warm without cyanosis or clubbing.      Impression & Recommendations:  Problem # 1:  COPD (ICD-496)  Extremely mild by pft's 06/19/10 with disproportionate symptoms suggesting upper airway cough syndrome  Orders: Consultation Level V (21308)  Problem # 2:  COUGH (ICD-786.2)   Classic Upper airway cough syndrome, so named because it's frequently impossible to sort out how much is  CR/sinusitis with freq throat clearing (which can be related to primary GERD)   vs  causing  secondary extra esophageal GERD from wide swings in gastric pressure that occur with throat clearing, promoting self use of mint and menthol lozenges that reduce the lower esophageal sphincter tone and exacerbate the problem further These are the same pts who not infrequently have failed to tolerate ace inhibitors,  dry powder inhalers or biphosphonates or report having reflux symptoms that don't respond to standard doses of PPI  I don't  believe she's tolerating ACE,  has trouble using DPI's (advair and spiriva) and her GERD is not well controlled so sugges  a) No ACE b) No DPIs c) Rx GERD with diet, daily ppi for now (may see improvment in this also once off ace > 1 month  Orders: Consultation Level V (65784)  Problem # 3:  HYPERTENSION (ICD-401.9)  The following medications were removed from the medication list:    Enalapril Maleate 5 Mg Tabs (Enalapril maleate) ..... One tablet two times a day Her updated medication list for this problem includes:    Benicar Hct 20-12.5 Mg Tabs (Olmesartan medoxomil-hctz) ..... One tablet by mouth daily   ACE inhibitors are problematic in  pts with airway complaints because  even experienced pulmonologists can't always distinguish ace effects from copd/asthma.  By themselves they don't actually cause a problem, much like oxygen can't by itself start a fire, but they certainly serve as a powerful catalyst or enhancer for any "fire"  or inflammatory process in the upper airway, be it caused by an ET  tube or more commonly reflux (especially in the  obese or pts with known GERD or who are on biphoshonates).  In the era of ARB near equivalency until we have a better handle on the reversibility of the airway problem, it just makes sense to avoid ace entirely in the short run and then decide later, having established a level of airway control  whether to add back ace but even then being very careful to observe the pt for worsening airway control and number of meds used/ needed to control symptoms.    Orders: Consultation Level V (04540)  Problem # 4:  TOBACCO ABUSE (ICD-305.1)  Discussed but not ready to committ to quit at this point - emphasized risks involved in continuing smoking and that patient should consider these in the context of the cost of smoking relative to the benefit obtained.   I took this opportunity to emphasize  the consequences of smoking in airway disorders based on all the  data we have from the multiple national lung health studies indicating that smoking cessation, not choice of inhalers or physicians, is the most important aspect of care.    Medications Added to Medication List This Visit: 1)  Prilosec Otc 20 Mg Tbec (Omeprazole magnesium) .... Take  one 30-60 min before first meal of the day 2)  Benicar Hct 20-12.5 Mg Tabs (Olmesartan medoxomil-hctz) .... One tablet by mouth daily  Other Orders: T-2 View CXR (71020TC)  Patient Instructions: 1)  Stop smoking before it stops you or symptoms progress 2)  stop enalapril 3)  Start benicar 2012.5  one each am (take one half if too strong) 4)  Stop fish oil 5)  Take Prilosec 20 mg Take  one 30-60 min before first meal of the day until no longer having heartburn  6)  GERD (REFLUX)  is a common cause of respiratory symptoms. It commonly presents without heartburn and can be treated with medication, but also with lifestyle changes including avoidance of late meals, excessive alcohol, smoking cessation, and avoid fatty foods, chocolate, peppermint, colas, red wine, and acidic juices such as orange juice. NO MINT OR MENTHOL PRODUCTS SO NO COUGH DROPS  7)  USE SUGARLESS CANDY INSTEAD (jolley ranchers)  8)  NO OIL BASED VITAMINS  9)  Please schedule a follow-up appointment in 4  weeks, sooner if needed

## 2010-12-11 NOTE — Assessment & Plan Note (Signed)
Summary: 6 m f/u dlo   Vital Signs:  Patient profile:   50 year old female Weight:      114 pounds Temp:     98.3 degrees F oral Pulse rate:   88 / minute Pulse rhythm:   regular BP sitting:   140 / 80  (left arm) Cuff size:   regular  Vitals Entered By: Mervin Hack CMA Duncan Dull) (March 05, 2010 10:39 AM) CC: 6 month follow-up   History of Present Illness: Had spine surgery again in January Cyst removed and restabilized with rod Still with pain--tries to do without the percocet  Mood has been so-so Lots of tension with son and grandkids not really depressed  Restless legs has worsened uses biofreeze topically and it helps cyclobenzaprine not really helping this--takes three times a day xanax two times a day --last dose at 6-8PM  Started on chol meds but cardiologist Still has chest pain -- stress test was normal though  Started on spiriva but it caused throat symptoms and cough didn't notice any improvement anyway  No seizures lately on lamictal  Allergies: 1)  * Depakote 2)  * Benadryl 3)  * Klonopin 4)  Tegretol 5)  Spiriva Handihaler (Tiotropium Bromide Monohydrate)  Past History:  Past medical, surgical, family and social histories (including risk factors) reviewed for relevance to current acute and chronic problems.  Past Medical History: Reviewed history from 10/23/2009 and no changes required. 1.  COPD: smokes 1 ppd 2. Depression 3. GERD 4. Seizure disorder 5. Valvular heart disease: Initial workup a number of years ago at [facility].  Echo (12/10) showed EF 60-65%, normal LV size, moderate aortic insufficiency with a trileaflet aortic valve and normal aortic root size.  Mild mitral regurgitation.  6. Anxiety/Panic disorder 7. Glaucoma 8. Hemorrhoids 9. Fibromyalgia 10. Anal Fissure 11. Arthritis 12. Chronic Headaches 13. Hypertension 14. IBS 15. Lexiscan myoview (12/10): EF 77%, normal wall motion, normal perfusion.  16. History of lumbar spine  fusion.   Past Surgical History: ECHO 1995 Cardiac Cath Hysterectomy Breast bx- several  ~ 1989 + L-S fusion with cage - [facility] 07/03 Echo 08/03 Myoview- EF 52%, mild LVH 12/05 EGD 05/06 Lumbar decomp/ cyst removal 01/08 Lumbar spine surgery--cyst removed/rod inserted 1/11    Dr Franky Macho  Family History: Reviewed history from 09/19/2008 and no changes required. Dad fairly healthy Mom with rhematoid arthritis Brother and sister with migraines CAD in both sets of grandparents DM and HTN  in both sides also Family History of Colon Cancer:uncle Family History of Breast Cancer: aunts (multiple) Family History of Ovarian Cancer:great aunt Family History of Stomach Cancer:? Family History of Colitis/Crohn's/IBS :sister (likely IBS)  Social History: Reviewed history from 10/23/2009 and no changes required. Current Smoker, 1ppd Divorced then remarried 2001 Children:3 Alcohol use-no Disabled since 2003 Daily Caffeine Use Marijuana use  Review of Systems       sleep is disturbed --falls asleep best 4-5AM and stays asleep  Has to get up at 6AM to get grandson up and then she goes back for a while appetite is okay weight stable  Physical Exam  General:  alert.  NAD Neck:  supple, no masses, no thyromegaly, and no cervical lymphadenopathy.   Lungs:  normal respiratory effort.  Decreased breath sounds but clear Heart:  normal rate and regular rhythm.   ?S4 soft systolic murmur Extremities:  no edema Psych:  normally interactive, good eye contact, not anxious appearing, and not depressed appearing.     Impression &  Recommendations:  Problem # 1:  COPD (ICD-496) Assessment Unchanged stable without Rx discussed stopping the last of her cigarettes. Down to 1/2 PPD  The following medications were removed from the medication list:    Spiriva Handihaler 18 Mcg Caps (Tiotropium bromide monohydrate) .Marland Kitchen... 1 capsule inhaled once daily  Problem # 2:  DEPRESSION  (ICD-311) Assessment: Unchanged stable with Rx stressors but mood okay  Her updated medication list for this problem includes:    Paroxetine Hcl 20 Mg Tabs (Paroxetine hcl) .Marland Kitchen... Take one by mouth once a day    Alprazolam 1 Mg Tabs (Alprazolam) .Marland Kitchen... 1 two times a day as needed for anxiety  Problem # 3:  ANXIETY (ICD-300.00) Assessment: Comment Only ongoing stress with son he has 5 children and she is caring for 1 of them big reason she still smokes  Her updated medication list for this problem includes:    Paroxetine Hcl 20 Mg Tabs (Paroxetine hcl) .Marland Kitchen... Take one by mouth once a day    Alprazolam 1 Mg Tabs (Alprazolam) .Marland Kitchen... 1 two times a day as needed for anxiety  Problem # 4:  SEIZURE DISORDER (ICD-780.39) Assessment: Unchanged no seizures on med  Her updated medication list for this problem includes:    Lamictal 100 Mg Tabs (Lamotrigine) .Marland Kitchen... 1 by mouth two times a day  Complete Medication List: 1)  Lamictal 100 Mg Tabs (Lamotrigine) .Marland Kitchen.. 1 by mouth two times a day 2)  Paroxetine Hcl 20 Mg Tabs (Paroxetine hcl) .... Take one by mouth once a day 3)  Enalapril Maleate 2.5 Mg Tabs (Enalapril maleate) .... Take 1 tablet by mouth two times a day 4)  Alprazolam 1 Mg Tabs (Alprazolam) .Marland Kitchen.. 1 two times a day as needed for anxiety 5)  Cyclobenzaprine Hcl 5 Mg Tabs (Cyclobenzaprine hcl) .Marland Kitchen.. 1 three times a day as needed  for fibromyalgia 6)  Simvastatin 20 Mg Tabs (Simvastatin) .... Take one tablet by mouth daily at bedtime 7)  Fish Oil 1000 Mg Caps (Omega-3 fatty acids) .... 2 caps by mouth daily 8)  Percocet 5-325 Mg Tabs (Oxycodone-acetaminophen) .... Take 1 by mouth two times a day  Patient Instructions: 1)  Please schedule a follow-up appointment in 7-8 months for physical Prescriptions: ALPRAZOLAM 1 MG TABS (ALPRAZOLAM) 1 two times a day as needed for anxiety  #180 x 1   Entered and Authorized by:   Cindee Salt MD   Signed by:   Cindee Salt MD on 03/05/2010    Method used:   Print then Give to Patient   RxID:   [id number]   Current Allergies (reviewed today): * DEPAKOTE * BENADRYL * KLONOPIN TEGRETOL SPIRIVA HANDIHALER (TIOTROPIUM BROMIDE MONOHYDRATE)

## 2010-12-11 NOTE — Letter (Signed)
Summary: Clearance Letter  Architectural technologist at [facility] [address]   [place], [place] [address]   Phone: [phone]  Fax: [phone]    August 28, 2010  Re:     Newt Lukes Address:   [address]     [place], [place]  [address] DOB:     July 19, 1961 MRN:     [id number]   Dear Dr. Candyce Churn,  The above named patient has cardiac clearance for multiple teeth extraction under general anesthesia.           Sincerely,     Marca Ancona, MD

## 2010-12-11 NOTE — Progress Notes (Signed)
Summary: Rx Alprazolam  Phone Note Refill Request Call back at [phone] Message from:  [facility] on November 18, 2009 10:24 AM  Refills Requested: Medication #1:  ALPRAZOLAM 1 MG TABS 1 two times a day as needed for anxiety   Last Refilled: 11/07/2009 Received refill request, please advise   Method Requested: Electronic Initial call taken by: Linde Gillis CMA Duncan Dull),  November 18, 2009 10:24 AM  Follow-up for Phone Call        got 1 month with refill just a month ago She should have another refill If she used both of them, that is way too much and she will need appt to discuss Follow-up by: Cindee Salt MD,  November 18, 2009 1:34 PM  Additional Follow-up for Phone Call Additional follow up Details #1::        left message on machine for patient to return call.  DeShannon Smith CMA Duncan Dull)  November 18, 2009 2:52 PM   called [facility] and pt got original rx filled 10/11/2009 and then got the refill 11/07/2009. DeShannon Katrinka Blazing CMA Duncan Dull)  November 18, 2009 2:53 PM   patient called back and stated she only has 10pills left, I advised pt to schedule appt, she did 11/25/2009 @ 12:15 DeShannon Katrinka Blazing CMA (AAMA)  November 18, 2009 4:30 PM     Additional Follow-up for Phone Call Additional follow up Details #2::    noted Follow-up by: Cindee Salt MD,  November 18, 2009 5:43 PM

## 2010-12-11 NOTE — Assessment & Plan Note (Signed)
Summary: ITCHY RASH   Vital Signs:  Patient profile:   50 year old female Height:      60 inches Weight:      114.50 pounds BMI:     22.44 Temp:     98.3 degrees F oral Pulse rate:   84 / minute Pulse rhythm:   regular BP sitting:   144 / 84  (left arm) Cuff size:   regular  Vitals Entered By: Delilah Shan CMA Duncan Dull) (December 25, 2009 3:37 PM) CC: Itchy rash   History of Present Illness: 50 yo here for acute visit for itchy rash. Discharged from hospital two weeks ago s/p back surgery. Was fine in hospital and when she got home. 3-4 days ago started to develop itchy bumps on neck, chest and flanks bilaterally. When she takes Benadryl, rash stops itching and goes away eventually.  Known allergy to Tegretol, otherwise no known allergies. Did get Percocet when she was discharged but states she has had that before with no issues. Only new possibly allergen is new laundry detergent her son bought for her.  Current Medications (verified): 1)  Lamictal 100 Mg Tabs (Lamotrigine) .Marland Kitchen.. 1 By Mouth Two Times A Day 2)  Paroxetine Hcl 20 Mg  Tabs (Paroxetine Hcl) .... Take One By Mouth Once A Day 3)  Enalapril Maleate 2.5 Mg  Tabs (Enalapril Maleate) .... Take 1 Tablet By Mouth Two Times A Day 4)  Alprazolam 1 Mg Tabs (Alprazolam) .Marland Kitchen.. 1 Two Times A Day As Needed For Anxiety 5)  Hyoscyamine Sulfate Cr 0.375 Mg  Tb12 (Hyoscyamine Sulfate) .... Take 1 Tablet By Mouth Two Times A Day For Abdominal Pain 6)  Cyclobenzaprine Hcl 5 Mg Tabs (Cyclobenzaprine Hcl) .Marland Kitchen.. 1 Three Times A Day As Needed  For Fibromyalgia 7)  Simvastatin 20 Mg Tabs (Simvastatin) .... Take One Tablet By Mouth Daily At Bedtime 8)  Spiriva Handihaler 18 Mcg Caps (Tiotropium Bromide Monohydrate) .Marland Kitchen.. 1 Capsule Inhaled Once Daily 9)  Vicodin Hp 10-660 Mg Tabs (Hydrocodone-Acetaminophen) .... Take 1 Every 4-6 Hrs As Needed Severe Pain  Allergies: 1)  * Depakote 2)  * Benadryl 3)  * Klonopin 4)  Tegretol  Review of  Systems      See HPI ENT:  Denies difficulty swallowing. Resp:  Denies shortness of breath.  Physical Exam  General:  alert, well-developed, well-nourished, and well-hydrated.   Mouth:  no erythema and no lesions.   Lungs:  normal respiratory effort and normal breath sounds.   Heart:  normal rate, regular rhythm, and no gallop.   Gr 2/6 early diastolic murmur loudest at base Skin:  two urticaria on left neck, otherwise normal.  Well healing surgical incision lumbar spine. Psych:  normally interactive, flat affect, and moderately anxious.    Impression & Recommendations:  Problem # 1:  URTICARIA (ICD-708.9) Assessment New Source uncertain, but advised to stop using new laudry detergent and wash all clothes, towels, etc washed in detergent in her old detergent. Continue as needed Benadryl. Call 911 if develops shortness of breath or feeling like throat is closing. Pt expressed understanding.  Complete Medication List: 1)  Lamictal 100 Mg Tabs (Lamotrigine) .Marland Kitchen.. 1 by mouth two times a day 2)  Paroxetine Hcl 20 Mg Tabs (Paroxetine hcl) .... Take one by mouth once a day 3)  Enalapril Maleate 2.5 Mg Tabs (Enalapril maleate) .... Take 1 tablet by mouth two times a day 4)  Alprazolam 1 Mg Tabs (Alprazolam) .Marland Kitchen.. 1 two times a day as needed  for anxiety 5)  Hyoscyamine Sulfate Cr 0.375 Mg Tb12 (Hyoscyamine sulfate) .... Take 1 tablet by mouth two times a day for abdominal pain 6)  Cyclobenzaprine Hcl 5 Mg Tabs (Cyclobenzaprine hcl) .Marland Kitchen.. 1 three times a day as needed  for fibromyalgia 7)  Simvastatin 20 Mg Tabs (Simvastatin) .... Take one tablet by mouth daily at bedtime 8)  Spiriva Handihaler 18 Mcg Caps (Tiotropium bromide monohydrate) .Marland Kitchen.. 1 capsule inhaled once daily 9)  Vicodin Hp 10-660 Mg Tabs (Hydrocodone-acetaminophen) .... Take 1 every 4-6 hrs as needed severe pain  Current Allergies (reviewed today): * DEPAKOTE * BENADRYL * KLONOPIN TEGRETOL

## 2010-12-11 NOTE — Assessment & Plan Note (Signed)
Summary: F6M/AMD  Medications Added ENALAPRIL MALEATE 5 MG TABS (ENALAPRIL MALEATE)  FISH OIL 1000 MG CAPS (OMEGA-3 FATTY ACIDS) Take 2 tablet by mouth once a day      Allergies Added:   Visit Type:  Follow-up Referring Provider:  Tillman Abide, MD Primary Provider:  Tillman Abide, MD  CC:  chest pain and shortness of breath with numbness in neck down to fingers off & on for a couple of days.Molly Peters  History of Present Illness: 50 yo with moderate aortic insufficiency and COPD returns for followup.  Her last echocardiogram showed moderate aortic insufficiency with preserved LV size and systolic function.  There was mild MR.  The aortic root was not dilated. Given chest pain, she had a Lexiscan myoview which was normal.   She continues to have significant exertional shortness of breath, now after walking about 100 yards.  She was able to walk around the [place] outlets over the weekend.  She is short of breath with 1 flight of steps.  She gets occasional chest pain that is atypical in nature.  The chest pain tends to be associated with wheezing and is pleuritic.  At last appointment, I thought that her symptoms were likely more related to COPD.  I tried her on Spiriva but it made her hoarse and she could not take it.  Symbicort gave her a yeast infection in her throat.  Labs (11/10): K 4.5, creatinine 1.2, TSH normal Labs (12/10): TGs 244, LDL 156, HDL 49, BNP 12 Labs (2/11): K 4.5, creatinine 0.81, TGs 329, HDL 35, LDL 31  Current Medications (verified): 1)  Lamictal 100 Mg Tabs (Lamotrigine) .Molly Peters.. 1 By Mouth Two Times A Day 2)  Paroxetine Hcl 20 Mg  Tabs (Paroxetine Hcl) .... Take One By Mouth Once A Day 3)  Enalapril Maleate 2.5 Mg  Tabs (Enalapril Maleate) .... Take 1 Tablet By Mouth Two Times A Day 4)  Alprazolam 1 Mg Tabs (Alprazolam) .Molly Peters.. 1 Two Times A Day As Needed For Anxiety 5)  Cyclobenzaprine Hcl 5 Mg Tabs (Cyclobenzaprine Hcl) .Molly Peters.. 1 Three Times A Day As Needed  For  Fibromyalgia 6)  Simvastatin 20 Mg Tabs (Simvastatin) .... Take One Tablet By Mouth Daily At Bedtime 7)  Percocet 5-325 Mg Tabs (Oxycodone-Acetaminophen) .... Take 1 By Mouth Two Times A Day  Allergies (verified): 1)  * Depakote 2)  * Benadryl 3)  * Klonopin 4)  Tegretol 5)  Spiriva Handihaler (Tiotropium Bromide Monohydrate)  Past History:  Past Surgical History: Last updated: 03/05/2010 ECHO 1995 Cardiac Cath Hysterectomy Breast bx- several  ~ 1989 + L-S fusion with cage - [facility] 07/03 Echo 08/03 Myoview- EF 52%, mild LVH 12/05 EGD 05/06 Lumbar decomp/ cyst removal 01/08 Lumbar spine surgery--cyst removed/rod inserted 1/11    Dr Franky Macho  Family History: Last updated: 09/19/2008 Dad fairly healthy Mom with rhematoid arthritis Brother and sister with migraines CAD in both sets of grandparents DM and HTN  in both sides also Family History of Colon Cancer:uncle Family History of Breast Cancer: aunts (multiple) Family History of Ovarian Cancer:great aunt Family History of Stomach Cancer:? Family History of Colitis/Crohn's/IBS :sister (likely IBS)  Social History: Last updated: 10/23/2009 Current Smoker, 1ppd Divorced then remarried 2001 Children:3 Alcohol use-no Disabled since 2003 Daily Caffeine Use Marijuana use  Risk Factors: Caffeine Use: 5+ (09/19/2008)  Risk Factors: Smoking Status: current (10/23/2009) Packs/Day: 1 (10/31/2008) Passive Smoke Exposure: yes (10/31/2008)  Past Medical History: Reviewed history from 10/23/2009 and no changes required. 1.  COPD:  smokes 1 ppd 2. Depression 3. GERD 4. Seizure disorder 5. Valvular heart disease: Initial workup a number of years ago at [facility].  Echo (12/10) showed EF 60-65%, normal LV size, moderate aortic insufficiency with a trileaflet aortic valve and normal aortic root size.  Mild mitral regurgitation.  6. Anxiety/Panic disorder 7. Glaucoma 8. Hemorrhoids 9. Fibromyalgia 10. Anal Fissure 11.  Arthritis 12. Chronic Headaches 13. Hypertension 14. IBS 15. Lexiscan myoview (12/10): EF 77%, normal wall motion, normal perfusion.  16. History of lumbar spine fusion.   Family History: Reviewed history from 09/19/2008 and no changes required. Dad fairly healthy Mom with rhematoid arthritis Brother and sister with migraines CAD in both sets of grandparents DM and HTN  in both sides also Family History of Colon Cancer:uncle Family History of Breast Cancer: aunts (multiple) Family History of Ovarian Cancer:great aunt Family History of Stomach Cancer:? Family History of Colitis/Crohn's/IBS :sister (likely IBS)  Social History: Reviewed history from 10/23/2009 and no changes required. Current Smoker, 1ppd Divorced then remarried 2001 Children:3 Alcohol use-no Disabled since 2003 Daily Caffeine Use Marijuana use  Review of Systems       All systems reviewed and negative except as per HPI.   Vital Signs:  Patient profile:   50 year old female Height:      60 inches Weight:      114 pounds BMI:     22.34 Pulse rate:   95 / minute BP sitting:   138 / 92  (left arm) Cuff size:   regular  Vitals Entered By: Bishop Dublin, CMA (June 02, 2010 8:52 AM)  Physical Exam  General:  Well developed, well nourished, in no acute distress. Neck:  Neck supple, no JVD. No masses, thyromegaly or abnormal cervical nodes. Lungs:  normal respiratory effort.  Decreased breath sounds but clear Heart:  Non-displaced PMI, chest non-tender; regular rate and rhythm, S1, S2 without rubs or gallops. 2/6 early to mid diastolic murmur RUSB.  Carotid upstroke normal, no bruit. Normal abdominal aortic size, no bruits. Femorals normal pulses, no bruits. Pedals normal pulses. No edema, no varicosities. Abdomen:  Bowel sounds positive; abdomen soft and non-tender without masses, organomegaly, or hernias noted. No hepatosplenomegaly. Extremities:  No clubbing or cyanosis. Neurologic:  Alert and oriented  x 3. Psych:  Normal affect.   Impression & Recommendations:  Problem # 1:  SHORTNESS OF BREATH (ICD-786.05) I think that COPD plays a large rolle in Mrs Cullins' dyspnea.  She does not appear volume overloaded, BNP has been normal, and aortic insufficiency is only moderate.  She has had side effects with Symbicort and Spiriva.  I am going to get a set of full PFTs and will refer her to pulmonology to see if they can find an inhaler regimen for her that will help.    Problem # 2:  CHEST PAIN UNSPECIFIED (ICD-786.50) Patient has occasional episodes of chest pain associated with wheezing.  The pain tends to be pleuritic.  This may be related to bronchospasm.   Problem # 3:  VALVULAR HEART DISEASE (ICD-424.90) Moderate aortic insufficiency with preserved LV EF and size.  The valve was trileaflet.  The aortic root was not enlarged.  Cause of the valvular degeneration is not clear.  I will continue screening, next echo in 12/11.  I will also increase afterload reduction by increasing enalapril to 5 mg two times a day with BMET and BNP in 2 wks.   Other Orders: EKG w/ Interpretation (93000) Pulmonary Referral (Pulmonary) Pulmonary Function  Test (PFT)  Patient Instructions: 1)  Your physician recommends that you return for lab work in:2 weeks (BNP) 2)  Your physician wants you to follow-up in:   Jan 2012 You will receive a reminder letter in the mail two months in advance. If you don't receive a letter, please call our office to schedule the follow-up appointment. 3)  Your physician has requested that you have an echocardiogram.  Echocardiography is a painless test that uses sound waves to create images of your heart. It provides your doctor with information about the size and shape of your heart and how well your heart's chambers and valves are working.  This procedure takes approximately one hour. There are no restrictions for this procedure. December 2011 4)  Your physician has recommended that  you have a pulmonary function test.  Pulmonary Function Tests are a group of tests that measure how well air moves in and out of your lungs. Will call to schedule referral with Pulmonologist 5)  Your physician has recommended you make the following change in your medication: INCREASE enalapril 5 mg two times a day and START fish oil 2000mg  daily.  Prescriptions: ENALAPRIL MALEATE 5 MG TABS (ENALAPRIL MALEATE)   #120 x 4   Entered by:   Benedict Needy, RN   Authorized by:   Marca Ancona, MD   Signed by:   Benedict Needy, RN on 06/02/2010   Method used:   Print then Give to Patient   RxID:   [id number]

## 2010-12-11 NOTE — Progress Notes (Signed)
Summary: refill on Benicar/HCT.    Phone Note Call from Patient Call back at Home Phone [phone]   Caller: Patient Call For: wert Reason for Call: Refill Medication Summary of Call: Need refill on benicar hct 20-12.5mg .//[facility] Initial call taken by: Darletta Moll,  August 04, 2010 8:49 AM  Follow-up for Phone Call        pt was just seen by MW on 07/30/2010 and looks like we already sent this rx into [facility] electronically.  [id number].  Aundra Millet Reynolds LPN  August 04, 2010 9:03 AM   Additional Follow-up for Phone Call Additional follow up Details #1::        Called and spoke with pt.  She states that rx was not recieved by [facility].  She gave me number to call- this was actually [facility]. I telephoned in rx refill for benicar hct and spoke with pt and made aware this was done. Additional Follow-up by: Vernie Murders,  August 04, 2010 9:21 AM    Prescriptions: BENICAR HCT 20-12.5 MG  TABS (OLMESARTAN MEDOXOMIL-HCTZ) One tablet by mouth daily  #90 x 3   Entered by:   Vernie Murders   Authorized by:   Nyoka Cowden MD   Signed by:   Vernie Murders on 08/04/2010   Method used:   Telephoned to ...       [facility] (mail-order)             , [place]         Ph: [phone]       Fax: [phone]   RxID:   [id number]

## 2010-12-11 NOTE — Letter (Signed)
Summary: Medical Record Release  Medical Record Release   Imported By: Harlon Flor 08/29/2010 08:10:43  _____________________________________________________________________  External Attachment:    Type:   Image     Comment:   External Document

## 2010-12-11 NOTE — Letter (Signed)
Summary: [facility]  [facility]   Imported By: Maryln Gottron 02/12/2010 14:28:32  _____________________________________________________________________  External Attachment:    Type:   Image     Comment:   External Document  Appended Document: [facility] doing okay at post op check

## 2010-12-11 NOTE — Letter (Signed)
Summary: [facility]  [facility]   Imported By: Lanelle Bal 06/11/2010 10:46:20  _____________________________________________________________________  External Attachment:    Type:   Image     Comment:   External Document  Appended Document: [facility] no changes

## 2010-12-11 NOTE — Progress Notes (Signed)
Summary: needs refill on xanax- waiting for mail order  Phone Note Refill Request Call back at Home Phone [phone] Message from:  Patient  Refills Requested: Medication #1:  ALPRAZOLAM 1 MG TABS 1 two times a day as needed for anxiety Pt [facility] and she is waiting for a shipment that is due on 10/09/10.  She is asking if she can get enough to last until then,  uses [facility].  Initial call taken by: [name] CMA, AAMA,  October 01, 2010 1:08 PM  Follow-up for Phone Call        please call in to [facility].  thanks.  Follow-up by: Crawford Givens MD,  October 01, 2010 1:56 PM  Additional Follow-up for Phone Call Additional follow up Details #1::        Medication phoned to pharmacy. Patient Advised.  Additional Follow-up by: Delilah Shan CMA Wessie Shanks Dull),  October 01, 2010 3:10 PM    Prescriptions: ALPRAZOLAM 1 MG TABS (ALPRAZOLAM) 1 two times a day as needed for anxiety  #30 x 0   Entered and Authorized by:   Crawford Givens MD   Signed by:   Crawford Givens MD on 10/01/2010   Method used:   [facility]ph[facility] ...       [facility]       ID # [phone]       , [place]         Ph:        Fax:    RxID:   [id number]

## 2010-12-11 NOTE — Miscellaneous (Signed)
Summary: Orders Update pft charges  Clinical Lists Changes  Orders: Added new Service order of Carbon Monoxide diffusing w/capacity (94720) - Signed Added new Service order of Lung Volumes (94240) - Signed Added new Service order of Spirometry (Pre & Post) (94060) - Signed 

## 2010-12-11 NOTE — Assessment & Plan Note (Signed)
Summary: BACK,LEFT LEG PAIN/CLE   Vital Signs:  Patient profile:   50 year old female Height:      60 inches Weight:      114 pounds BMI:     22.34 Temp:     98.5 degrees F oral Pulse rate:   80 / minute Pulse rhythm:   regular BP sitting:   116 / 72  (left arm) Cuff size:   regular  Vitals Entered By: Lewanda Rife LPN (November 12, 2009 8:14 AM)  Primary Care Provider:  Tillman Abide, MD  CC:  lower back pain runs down left leg and also into rt hip.  History of Present Illness: Here for lower back pain, pain down L leg and into R hip --has hx of 2 back surgeries, 2 fusions, cysts on disk--fused, rlupture L5-S1, cyst on L4-5-- --has seen Dr Caleen Jobs appt 11/19/2009 --was seen [facility] x2 in past 2wks--gave limited amt of vicodin 7.5--lasts 1-2 hrs --aleve doesnt help --tingling to numb when stands for more than on L leg  Here with husband.  Allergies: 1)  * Depakote 2)  * Benadryl 3)  * Klonopin 4)  Tegretol  Past History:  Past Surgical History: Reviewed history from 09/07/2007 and no changes required. ECHO 1995 Cardiac Cath Hysterectomy Breast bx- several  ~ 1989 + L-S fusion with cage - [facility] 07/03 Echo 08/03 Myoview- EF 52%, mild LVH 12/05 EGD 05/06 Lumbar decomp/ cyst removal 01/08  Review of Systems      See HPI  Physical Exam  General:  alert, well-developed, well-nourished, and well-hydrated.  in pain Msk:  pain wiht all movement, able to forward flex <20degrees, not able to lateral flex or rotate to L due to pain, minimal rotation and lateral flex to R  uses hands to push into stance, slow short steps Neurologic:  alert & oriented X3 and sensation intact to light touch.   Skin:  turgor normal and color normal.   Psych:  normally interactive, flat affect, and moderately anxious.  in pain   Impression & Recommendations:  Problem # 1:  BACK PAIN, LUMBAR, WITH RADICULOPATHY (ICD-724.4) Assessment Deteriorated worsening of low back pain and  radiation to both legs L>R for 2+ wks will get MRI with contrast--will need to see Dr Franky Macho sooner than appt if urgent will use ice to area will use vicodin 10 q4-6 h prn  Her updated medication list for this problem includes:    Cyclobenzaprine Hcl 5 Mg Tabs (Cyclobenzaprine hcl) .Marland Kitchen... 1 three times a day as needed  for fibromyalgia    Vicodin Hp 10-660 Mg Tabs (Hydrocodone-acetaminophen) .Marland Kitchen... Take 1 every 4-6 hrs as needed severe pain  Orders: Radiology Referral (Radiology)  Complete Medication List: 1)  Lamictal 100 Mg Tabs (Lamotrigine) .Marland Kitchen.. 1 by mouth two times a day 2)  Paroxetine Hcl 20 Mg Tabs (Paroxetine hcl) .... Take one by mouth once a day 3)  Enalapril Maleate 2.5 Mg Tabs (Enalapril maleate) .... Take 1 tablet by mouth two times a day 4)  Alprazolam 1 Mg Tabs (Alprazolam) .Marland Kitchen.. 1 two times a day as needed for anxiety 5)  Hyoscyamine Sulfate Cr 0.375 Mg Tb12 (Hyoscyamine sulfate) .... Take 1 tablet by mouth two times a day for abdominal pain 6)  Cyclobenzaprine Hcl 5 Mg Tabs (Cyclobenzaprine hcl) .Marland Kitchen.. 1 three times a day as needed  for fibromyalgia 7)  Simvastatin 20 Mg Tabs (Simvastatin) .... Take one tablet by mouth daily at bedtime 8)  Spiriva Handihaler 18  Mcg Caps (Tiotropium bromide monohydrate) .Marland Kitchen.. 1 capsule inhaled once daily 9)  Vicodin Hp 10-660 Mg Tabs (Hydrocodone-acetaminophen) .... Take 1 every 4-6 hrs as needed severe pain  Patient Instructions: 1)  refer for MRI Prescriptions: VICODIN HP 10-660 MG TABS (HYDROCODONE-ACETAMINOPHEN) take 1 every 4-6 hrs as needed severe pain  #20 x 0   Entered and Authorized by:   Gildardo Griffes FNP   Signed by:   Gildardo Griffes FNP on 11/12/2009   Method used:   Print then Give to Patient   RxID:   [id number]   Current Allergies (reviewed today): * DEPAKOTE * BENADRYL * KLONOPIN TEGRETOL  Appended Document: Orders Update     Clinical Lists Changes  Orders: Added new Test order of  TLB-BMP (Basic Metabolic Panel-BMET) (80048-METABOL) - Signed

## 2010-12-11 NOTE — Miscellaneous (Signed)
Summary: update med list  Clinical Lists Changes  Medications: Changed medication from ENALAPRIL MALEATE 5 MG TABS (ENALAPRIL MALEATE) to ENALAPRIL MALEATE 5 MG TABS (ENALAPRIL MALEATE) one tablet two times a day

## 2010-12-11 NOTE — Letter (Signed)
Summary: [facility]  [facility]   Imported By: Lanelle Bal 04/04/2010 12:14:41  _____________________________________________________________________  External Attachment:    Type:   Image     Comment:   External Document  Appended Document: [facility] ongoing pain He continues to prescribe pain meds

## 2010-12-11 NOTE — Miscellaneous (Signed)
Summary: Orders Update   Clinical Lists Changes  Orders: Added new Service order of Venipuncture (40981) - Signed Added new Test order of TLB-BMP (Basic Metabolic Panel-BMET) (80048-METABOL) - Signed

## 2010-12-11 NOTE — Medication Information (Signed)
Summary: [facility]  [facility]   Imported By: Lester Trevorton 08/15/2010 10:02:45  _____________________________________________________________________  External Attachment:    Type:   Image     Comment:   External Document

## 2010-12-11 NOTE — Progress Notes (Signed)
Summary: DENTAL CLEARANCE   Phone Note Call from Patient Call back at Home Phone [phone]   Caller: SELF Call For: [facility] Summary of Call: DENTIST OFFICE NEEDS A WRITTEN LETTER STATING THAT IS OKAY FOR THE PT TO HAVE ORAL Sumner Boast DDS PHONE # [phone] Initial call taken by: Harlon Flor,  July 15, 2010 11:24 AM  Follow-up for Phone Call        [facility] TCB Benedict Needy, RN  July 15, 2010 2:51 PM   Pt having teeth pulled and some bones cut out of her mouth on 07/29/10  Please advise. Benedict Needy, RN  July 15, 2010 4:10 PM      Appended Document: DENTAL CLEARANCE Moderate aortic insufficiency.  Can have oral surgery. No indication for prophylactic antibiotics.   Appended Document: DENTAL CLEARANCE note faxed to dentist

## 2010-12-11 NOTE — Assessment & Plan Note (Signed)
Summary: 3:00 APPT/AMD  Medications Added ASPIRIN 81 MG TBEC (ASPIRIN) Take one tablet by mouth daily      Allergies Added:   Visit Type:  Follow-up Referring Provider:  Dr. Shirlee Latch Primary Provider:  Tillman Abide, MD  CC:  c/o chest pain and SOB.Marland Kitchen  History of Present Illness: 50 yo with aortic insufficiency and COPD returns for followup.  She continues to have significant exertional shortness of breath that seems to have worsened.  She is still smoking and did see Dr. Sherene Sires.  He stopped her ACEI with no change in her dyspnea or cough. She now gets short of breath taking a shower, getting dressed, pushing her trash can to the street.  She can walk < 1/4 mile on flat ground.  Sometimes she gets chest pain "like something sitting on my chest" with exertion.  This tends to last for about 5-10 minutes.  Sometimes this same sensation is associated with emotional stress.  She had an echo last month showing normal systolic function and mild to moderate aortic insufficiency.    Labs (11/10): K 4.5, creatinine 1.2, TSH normal Labs (12/10): TGs 244, LDL 156, HDL 49, BNP 12 Labs (2/11): K 4.5, creatinine 0.81, TGs 329, HDL 35, LDL 31 Labs (12/11): K 4.2, creatinine 1.0, TSH normal, LDL 66, HDL 46  ECG: NSR, mildly prolonged QTc  Current Medications (verified): 1)  Losartan Potassium-Hctz 100-12.5 Mg Tabs (Losartan Potassium-Hctz) .... Take 1 Tablet By Mouth Once A Day 2)  Alprazolam 1 Mg Tabs (Alprazolam) .Marland Kitchen.. 1 Two Times A Day As Needed For Anxiety 3)  Lamictal 100 Mg Tabs (Lamotrigine) .Marland Kitchen.. 1 By Mouth Two Times A Day 4)  Paroxetine Hcl 20 Mg  Tabs (Paroxetine Hcl) .... Take One By Mouth Once A Day 5)  Cyclobenzaprine Hcl 5 Mg Tabs (Cyclobenzaprine Hcl) .Marland Kitchen.. 1 Three Times A Day As Needed  For Fibromyalgia 6)  Simvastatin 20 Mg Tabs (Simvastatin) .... Take One Tablet By Mouth Daily At Bedtime 7)  Prilosec Otc 20 Mg Tbec (Omeprazole Magnesium) .... Take  One 30-60 Min Before First Meal of The  Day 8)  Percocet 7.5-325 Mg Tabs (Oxycodone-Acetaminophen) .Marland Kitchen.. 1 Two Times A Day As Needed  Allergies (verified): 1)  * Depakote 2)  * Benadryl 3)  * Klonopin 4)  Tegretol 5)  Spiriva Handihaler (Tiotropium Bromide Monohydrate)  Past History:  Past Surgical History: Last updated: 10/21/2010 ECHO 1995 Cardiac Cath Hysterectomy Breast bx- several  ~ 1989 + L-S fusion with cage - [facility] 07/03 Echo 08/03 Myoview- EF 52%, mild LVH 12/05 EGD 05/06 Lumbar decomp/ cyst removal 01/08 Lumbar spine surgery--cyst removed/rod inserted 1/11    Dr Lieutenant Diego myoview (10/2009): EF 77%, normal wall motion, normal perfusion.   Family History: Last updated: 06/23/2010 Dad fairly healthy Mom with rhematoid arthritis Brother and sister with migraines CAD in both sets of grandparents DM and HTN  in both sides also Family History of Colon Cancer:uncle Family History of Breast Cancer: aunts (multiple) Family History of Ovarian Cancer:great aunt Family History of Stomach Cancer:? Family History of Colitis/Crohn's/IBS :sister (likely IBS) asthma-daughter Cervical Cancer - mother and sister  Social History: Last updated: 12/01/2010 Current Smoker, 1ppd x38 yrs, now down to 4 cigs/day Divorced then remarried 2001 Children:3 Alcohol use-no Disabled since 2003 Daily Caffeine Use Marijuana use-occ  Risk Factors: Caffeine Use: 5+ (09/19/2008)  Risk Factors: Smoking Status: current (10/21/2010) Packs/Day: 1 (10/31/2008) Passive Smoke Exposure: yes (10/31/2008)  Past Medical History: 1.  COPD: smokes 1 ppd      -  PFT's 06/19/10 FEV1 1.97 (91%) ratio 73 and no better after B2,  DLC0 57% 2. Depression 3. GERD 4. Seizure disorder 5. Valvular heart disease: Initial workup a number of years ago at [facility].  Echo (10/2009) showed EF 60-65%,      normal LV size, moderate aortic insufficiency with a trileaflet aortic valve and normal aortic root size.  Mild      mitral regurgitation.  Repeat  echo (12/11): EF 55%, mild-moderate AI, mild MR, normal RV, normal PA systolic pressure.  6. Anxiety/Panic disorder 7. Glaucoma 8. Hemorrhoids 9. Fibromyalgia 10. Anal Fissure 11. Arthritis 12. Chronic Headaches 13. Hypertension 14. IBS 15. Chest pain: Lexiscan myoview in 2010 showed no ischemia or infarction.   Family History: Reviewed history from 06/23/2010 and no changes required. Dad fairly healthy Mom with rhematoid arthritis Brother and sister with migraines CAD in both sets of grandparents DM and HTN  in both sides also Family History of Colon Cancer:uncle Family History of Breast Cancer: aunts (multiple) Family History of Ovarian Cancer:great aunt Family History of Stomach Cancer:? Family History of Colitis/Crohn's/IBS :sister (likely IBS) asthma-daughter Cervical Cancer - mother and sister  Social History: Reviewed history from 06/23/2010 and no changes required. Current Smoker, 1ppd x38 yrs, now down to 4 cigs/day Divorced then remarried 2001 Children:3 Alcohol use-no Disabled since 2003 Daily Caffeine Use Marijuana use-occ  Review of Systems       All systems reviewed and negative except as per HPI.   Vital Signs:  Patient profile:   50 year old female Menstrual status:  hysterectomy Height:      60 inches Weight:      114 pounds BMI:     22.34 Pulse rate:   73 / minute BP sitting:   122 / 60  (left arm) Cuff size:   regular  Vitals Entered By: Lysbeth Galas CMA (December 01, 2010 3:08 PM)  Physical Exam  General:  Well developed, well nourished, in no acute distress.  Thin.  Neck:  Neck supple, no JVD. No masses, thyromegaly or abnormal cervical nodes. Lungs:  Mildly decreased breath sounds bilaterally.  Heart:  Non-displaced PMI, chest non-tender; regular rate and rhythm, S1, S2 without murmurs, rubs. +S4. Carotid upstroke normal, no bruit.  Pedals normal pulses. No edema, no varicosities. Abdomen:  Bowel sounds positive; abdomen soft and  non-tender without masses, organomegaly, or hernias noted. No hepatosplenomegaly. Extremities:  No clubbing or cyanosis. Neurologic:  Alert and oriented x 3. Psych:  Normal affect.   Impression & Recommendations:  Problem # 1:  CHEST PAIN UNSPECIFIED (ICD-786.50) Patient has episodes of exertional chest pain as well as worsening exertional dyspnea.  She had a normal myoview in 2010.  The chest pain is new over the last few weeks.  She also gets chest pain with emotional stress.  Echo showed no worsening of her valvular disease.  I would like her to take ASA 81 mg daily.  I will get an ETT-myoview for risk stratification.  Shortness of breath could certainly be due mainly to COPD.   Problem # 2:  SMOKER (ICD-305.1) I again counselled her to quit smoking.  She has cut back.   Problem # 3:  VALVULAR HEART DISEASE (ICD-424.90) Mild to moderate AI on last echo in 12/11.   Other Orders: EKG w/ Interpretation (93000) Nuclear Stress Test (Nuc Stress Test)  Patient Instructions: 1)  Your physician recommends that you schedule a follow-up appointment in: 1 months or sooner if you have any problems. 2)  Your physician has requested that you have an exercise stress myoview. Your appt is scheduled in [place] at [facility] on 12/09/10 @ 8:30 am please arrive at 8:15 am. You were given instructions for this test. For further information please visit [email or web address]  Please follow instruction sheet, as given. 3)  Your physician has recommended you make the following change in your medication: START Aspirin 81mg  once daily.

## 2010-12-11 NOTE — Letter (Signed)
Summary: [facility]  [facility]   Imported By: Sherian Rein 09/09/2010 08:39:18  _____________________________________________________________________  External Attachment:    Type:   Image     Comment:   External Document  Appended Document: [facility] 1 year post op doing okay changed percocet to hydrocodone

## 2010-12-11 NOTE — Progress Notes (Signed)
Summary: cyclobenzaprine  Phone Note Refill Request Message from:  Fax from Pharmacy on December 02, 2009 9:32 AM  Refills Requested: Medication #1:  CYCLOBENZAPRINE HCL 5 MG TABS 1 three times a day as needed  for fibromyalgia   Supply Requested: 1 month   Last Refilled: 11/07/2009 medicap [phone]   Method Requested: Electronic Initial call taken by: Benny Lennert CMA Duncan Dull),  December 02, 2009 9:32 AM  Follow-up for Phone Call        okay to send Rx  #90 x 3 Follow-up by: Cindee Salt MD,  December 02, 2009 10:27 AM  Additional Follow-up for Phone Call Additional follow up Details #1::        Rx faxed to pharmacy Additional Follow-up by: DeShannon Smith CMA Duncan Dull),  December 02, 2009 10:51 AM    Prescriptions: CYCLOBENZAPRINE HCL 5 MG TABS (CYCLOBENZAPRINE HCL) 1 three times a day as needed  for fibromyalgia  #90 x 3   Entered by:   Mervin Hack CMA (AAMA)   Authorized by:   Cindee Salt MD   Signed by:   Mervin Hack CMA (AAMA) on 12/02/2009   Method used:   Electronically to        [facility] [phone]* (retail)       [address] [place], [place]  [address]       Ph: [phone]       Fax: [phone]   RxID:   [id number]

## 2010-12-11 NOTE — Progress Notes (Signed)
Summary: Dental Clearance   Phone Note Call from Patient Call back at Home Phone [phone]   Caller: Self Call For: Molly Peters Summary of Call: Pt needs clearance to have dental fillings and needs a note stating that she requires to have an antibiotic prior to the dental work.  Dr. Lanetta Inch Fax # [phone] Initial call taken by: Harlon Flor,  December 02, 2010 10:53 AM  Follow-up for Phone Call        Can I send letter stating this? Thanks. Follow-up by: Lanny Hurst RN,  December 02, 2010 2:20 PM     Appended Document: Dental Clearance She does not need antibiotics.  I do not see where she has had prior endocarditis and her valvular disease does not warrant antibiotics with dental work.   Appended Document: Dental Clearance pt notified and note faxed to Dr.Sadri/sab

## 2010-12-11 NOTE — Letter (Signed)
Summary: [facility]  [facility]   Imported By: Lanelle Bal 01/21/2010 08:46:50  _____________________________________________________________________  External Attachment:    Type:   Image     Comment:   External Document  Appended Document: [facility] doing well post op sutures removed

## 2010-12-11 NOTE — Progress Notes (Signed)
Summary: Rx Alprazolam  Phone Note Refill Request Call back at [phone] Message from:  Medicap on February 03, 2010 10:59 AM  Refills Requested: Medication #1:  ALPRAZOLAM 1 MG TABS 1 two times a day as needed for anxiety   Last Refilled: 01/06/2010 Received faxed refill request, please advise.  Form in your IN box   Method Requested: Telephone to Pharmacy Initial call taken by: Linde Gillis CMA Duncan Dull),  February 03, 2010 11:00 AM  Follow-up for Phone Call        okay #60 x 1   Follow-up by: Cindee Salt MD,  February 03, 2010 1:50 PM  Additional Follow-up for Phone Call Additional follow up Details #1::        Rx called to pharmacy Additional Follow-up by: DeShannon Katrinka Blazing CMA Duncan Dull),  February 03, 2010 3:39 PM    Prescriptions: ALPRAZOLAM 1 MG TABS (ALPRAZOLAM) 1 two times a day as needed for anxiety  #60 x 1   Entered by:   Mervin Hack CMA (AAMA)   Authorized by:   Cindee Salt MD   Signed by:   Mervin Hack CMA (AAMA) on 02/03/2010   Method used:   Telephoned to ...       [facility] [address] [phone]* (retail)       [address] [place], [place]  [address]       Ph: [phone]       Fax: [phone]   RxID:   [id number]

## 2010-12-11 NOTE — Progress Notes (Signed)
Summary: rash is no better  Phone Note Call from Patient Call back at Home Phone [phone]   Caller: Patient Call For: Dr. Dayton Martes Summary of Call: Pt was seen on 2/16 for an itchy rash and she is no better.  Still taking benedryl.  Please advise on what to do.  Uses medicap Initial call taken by: [name] CMA,  January 01, 2010 12:07 PM  Follow-up for Phone Call        Will place derm referral. Follow-up by: Ruthe Mannan MD,  January 01, 2010 12:15 PM

## 2010-12-11 NOTE — Progress Notes (Signed)
Summary: Cardiac Clearance   Phone Note From Other Clinic Call back at Fax [phone]   Caller: [facility]  Call For: Shirlee Latch Summary of Call: Pt having oral surgery: Multiple teeth extraction under general anesthesia. Pt needs cardiac clearance. Please advise.  Initial call taken by: Benedict Needy, RN,  August 26, 2010 9:26 AM  Follow-up for Phone Call        Spoke to Dr. Shirlee Latch pt is cleared letter faxed.  Follow-up by: Benedict Needy, RN,  August 26, 2010 3:03 PM

## 2010-12-11 NOTE — Progress Notes (Signed)
Summary: refill request for alprazolam  Phone Note Refill Request Message from:  Fax from Pharmacy  Refills Requested: Medication #1:  ALPRAZOLAM 1 MG TABS 1 two times a day as needed for anxiety Faxed form from right source is on your desk.  Initial call taken by: [name] CMA,  July 30, 2010 8:17 AM  Follow-up for Phone Call        okay #180 x 1 Follow-up by: Cindee Salt MD,  July 30, 2010 1:44 PM  Additional Follow-up for Phone Call Additional follow up Details #1::        Rx faxed to pharmacy Additional Follow-up by: DeShannon Smith CMA Duncan Dull),  July 30, 2010 2:27 PM    Prescriptions: ALPRAZOLAM 1 MG TABS (ALPRAZOLAM) 1 two times a day as needed for anxiety  #180 x 1   Entered by:   Mervin Hack CMA (AAMA)   Authorized by:   Cindee Salt MD   Signed by:   Mervin Hack CMA (AAMA) on 07/30/2010   Method used:   Handwritten   RxID:   [id number]

## 2010-12-11 NOTE — Assessment & Plan Note (Signed)
Summary: cpx/rbh   Vital Signs:  Patient profile:   50 year old female Menstrual status:  hysterectomy Weight:      109 pounds Temp:     98.4 degrees F oral Pulse rate:   72 / minute Pulse rhythm:   regular BP sitting:   120 / 80  (left arm) Cuff size:   regular  Vitals Entered By: Mervin Hack CMA Duncan Dull) (October 21, 2010 9:38 AM) CC: adult physical     Menstrual Status hysterectomy   History of Present Illness: Doing fair  Restless legs have been getting worse Has to keep moving ankles and then legs Has to get up out of bed--this helps Uses the flexeril for the fibromyalgia--isn't helping her legs  Back has been better no pain meds for some time---variable spells of pain Does some exercises on TV fitness show discussed trying walking  Fell in March--?left ulnar fracture Occ feels that knees are unstable---"flares up" at times  Preventive Screening-Counseling & Management  Alcohol-Tobacco     Smoking Status: current     Smoking Cessation Counseling: yes     Smoke Cessation Stage: contemplative     Tobacco Counseling: to quit use of tobacco products  Comments: has patches and lozenges---may try again at new Years  Allergies: 1)  * Depakote 2)  * Benadryl 3)  * Klonopin 4)  Tegretol 5)  Spiriva Handihaler (Tiotropium Bromide Monohydrate)  Past History:  Past medical, surgical, family and social histories (including risk factors) reviewed for relevance to current acute and chronic problems.  Past Medical History: 1.  COPD: smokes 1 ppd      - PFT's 06/19/10 FEV1 1.97 (91%) ratio 73 and no better after B2,  DLC0 57% 2. Depression 3. GERD 4. Seizure disorder 5. Valvular heart disease: Initial workup a number of years ago at [facility].  Echo (10/2009) showed EF 60-65%,      normal LV size, moderate aortic insufficiency with a trileaflet aortic valve and normal aortic root size.  Mild      mitral regurgitation.  6. Anxiety/Panic disorder 7. Glaucoma 8.  Hemorrhoids 9. Fibromyalgia 10. Anal Fissure 11. Arthritis 12. Chronic Headaches 13. Hypertension 14. IBS  Past Surgical History: ECHO 1995 Cardiac Cath Hysterectomy Breast bx- several  ~ 1989 + L-S fusion with cage - [facility] 07/03 Echo 08/03 Myoview- EF 52%, mild LVH 12/05 EGD 05/06 Lumbar decomp/ cyst removal 01/08 Lumbar spine surgery--cyst removed/rod inserted 1/11    Dr Lieutenant Diego myoview (10/2009): EF 77%, normal wall motion, normal perfusion.   Family History: Reviewed history from 06/23/2010 and no changes required. Dad fairly healthy Mom with rhematoid arthritis Brother and sister with migraines CAD in both sets of grandparents DM and HTN  in both sides also Family History of Colon Cancer:uncle Family History of Breast Cancer: aunts (multiple) Family History of Ovarian Cancer:great aunt Family History of Stomach Cancer:? Family History of Colitis/Crohn's/IBS :sister (likely IBS) asthma-daughter Cervical Cancer - mother and sister  Social History: Reviewed history from 06/23/2010 and no changes required. Current Smoker, 1ppd x38 yrs Divorced then remarried 2001 Children:3 Alcohol use-no Disabled since 2003 Daily Caffeine Use Marijuana use-occ  Review of Systems General:  weight down some wears seat belt. 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 ENT:  Complains of ringing in ears; denies decreased hearing; lower teeth and torus removed--hasn't gotten dentures yet. CV:  Complains of chest pain or discomfort, palpitations, and shortness of breath  with exertion; denies difficulty breathing at night, difficulty breathing while lying down, fainting, and lightheadness; occ vague chest pain--recent echo which reportedly was stable seems to have some progression of DOE. Resp:  Complains of cough and shortness of breath; has cut down smoking but stil is. GI:  Complains of abdominal pain, bloody stools,  constipation, nausea, and vomiting; denies indigestion; varies constipation and diarrhea---IBS occ gets blood when she gets backed up Occ N/V. GU:  Complains of decreased libido; denies dysuria and incontinence; No sex--no problem for her. MS:  See HPI; Complains of joint pain, low back pain, and muscle aches. Derm:  Complains of lesion(s); denies rash; several dark moles she isn't worried about. Neuro:  Complains of headaches, numbness, and weakness; occ arm weakness right hand is burning and tingly in AM. Psych:  Complains of anxiety and depression; intermittent depression and anxiety stress with 76 year old son still in the house and with no job. Heme:  Denies abnormal bruising and enlarge lymph nodes. Allergy:  Denies seasonal allergies and sneezing.  Physical Exam  General:  alert and normal appearance.   Eyes:  pupils equal, pupils round, pupils reactive to light, and no optic disk abnormalities.   Ears:  R ear normal and L ear normal.   Mouth:  no erythema, no exudates, and no lesions.   Neck:  supple, no masses, no thyromegaly, no carotid bruits, and no cervical lymphadenopathy.   Breasts:  no masses, no abnormal thickening, no tenderness, and no adenopathy.   Lungs:  normal respiratory effort, no intercostal retractions, and no accessory muscle use.  Decreased breath sounds but clear Heart:  normal rate and regular rhythm.   S4 vs opening snap soft early diastolic murmur along sternal border and base Abdomen:  soft, non-tender, and no masses.   Msk:  no joint swelling and no redness over joints.   Pulses:  faint in left foot 1+ in right Extremities:  no edema Neurologic:  alert & oriented X3, strength normal in all extremities, and gait normal.   Skin:  no rashes and no suspicious lesions.   Psych:  normally interactive, good eye contact, not anxious appearing, and not depressed appearing.     Impression & Recommendations:  Problem # 1:  PREVENTIVE HEALTH CARE  (ICD-V70.0) Assessment Comment Only up to date with colon has mammo scheduled tomorrow  Problem # 2:  SMOKER (ICD-305.1) Assessment: Comment Only couselled 4-5 minutes about her quit date and need to quit now  Problem # 3:  RESTLESS LEG SYNDROME (ICD-333.94) Assessment: New seems distinct from the fibromyalgia will try sinemet CR for this (avoid narcotics)  Problem # 4:  BACK PAIN, LUMBAR, WITH RADICULOPATHY (ICD-724.4) Assessment: Comment Only asked her to get any percocet from Dr Franky Macho  Her updated medication list for this problem includes:    Cyclobenzaprine Hcl 5 Mg Tabs (Cyclobenzaprine hcl) .Marland Kitchen... 1 three times a day as needed  for fibromyalgia    Percocet 7.5-325 Mg Tabs (Oxycodone-acetaminophen) .Marland Kitchen... 1 two times a day as needed  Problem # 5:  HYPERTENSION (ICD-401.9) Assessment: Unchanged  good control  Her updated medication list for this problem includes:    Losartan Potassium-hctz 100-12.5 Mg Tabs (Losartan potassium-hctz) .Marland Kitchen... Take 1 tablet by mouth once a day  BP today: 120/80 Prior BP: 110/80 (07/30/2010)  Labs Reviewed: K+: 3.8 (06/16/2010) Creat: : 0.88 (06/16/2010)   Chol: 132 (12/23/2009)   HDL: 35 (12/23/2009)   LDL: 31 (12/23/2009)   TG: 329 (12/23/2009)  Orders: TLB-Renal  Function Panel (80069-RENAL) TLB-CBC Platelet - w/Differential (85025-CBCD) TLB-TSH (Thyroid Stimulating Hormone) (84443-TSH) Venipuncture (16109)  Complete Medication List: 1)  Losartan Potassium-hctz 100-12.5 Mg Tabs (Losartan potassium-hctz) .... Take 1 tablet by mouth once a day 2)  Alprazolam 1 Mg Tabs (Alprazolam) .Marland Kitchen.. 1 two times a day as needed for anxiety 3)  Lamictal 100 Mg Tabs (Lamotrigine) .Marland Kitchen.. 1 by mouth two times a day 4)  Paroxetine Hcl 20 Mg Tabs (Paroxetine hcl) .... Take one by mouth once a day 5)  Cyclobenzaprine Hcl 5 Mg Tabs (Cyclobenzaprine hcl) .Marland Kitchen.. 1 three times a day as needed  for fibromyalgia 6)  Simvastatin 20 Mg Tabs (Simvastatin) .... Take one  tablet by mouth daily at bedtime 7)  Prilosec Otc 20 Mg Tbec (Omeprazole magnesium) .... Take  one 30-60 min before first meal of the day 8)  Percocet 7.5-325 Mg Tabs (Oxycodone-acetaminophen) .Marland Kitchen.. 1 two times a day as needed 9)  Carbidopa-levodopa Cr 25-100 Mg Cr-tabs (Carbidopa-levodopa) .Marland Kitchen.. 1-2 tabs at bedtime to help with restless legs  Other Orders: Flu Vaccine 14yrs + MEDICARE PATIENTS (U0454) Administration Flu vaccine - MCR (G0008) TLB-Lipid Panel (80061-LIPID) TLB-Hepatic/Liver Function Pnl (80076-HEPATIC)  Patient Instructions: 1)  Please try the new med for the restless legs. Call if it doesn't work or if you have side effects 2)  Please schedule a follow-up appointment in 6 months .  Prescriptions: CARBIDOPA-LEVODOPA CR 25-100 MG CR-TABS (CARBIDOPA-LEVODOPA) 1-2 tabs at bedtime to help with restless legs  #60 x 5   Entered and Authorized by:   Cindee Salt MD   Signed by:   Cindee Salt MD on 10/21/2010   Method used:   Electronically to        [facility] [phone]* (retail)       [address] [place], [place]  [address]       Ph: [phone]       Fax: [phone]   RxID:   [id number]    Orders Added: 1)  Flu Vaccine 72yrs + MEDICARE PATIENTS [Q2039] 2)  Administration Flu vaccine - MCR [G0008] 3)  TLB-Renal Function Panel [80069-RENAL] 4)  TLB-CBC Platelet - w/Differential [85025-CBCD] 5)  TLB-TSH (Thyroid Stimulating Hormone) [84443-TSH] 6)  Venipuncture [36644] 7)  TLB-Lipid Panel [80061-LIPID] 8)  TLB-Hepatic/Liver Function Pnl [80076-HEPATIC]    Flu Vaccine Consent Questions     Do you have a history of severe allergic reactions to this vaccine? no    Any prior history of allergic reactions to egg and/or gelatin? no    Do you have a sensitivity to the preservative Thimersol? no    Do you have a past history of Guillan-Barre Syndrome? no    Do you currently have an acute febrile illness? no    Have you ever had a severe  reaction to latex? no    Vaccine information given and explained to patient? yes    Are you currently pregnant? no    Lot Number:[id number]   Exp Date:05/09/2011   Site Given  Left Deltoid IMCurrent Allergies (reviewed today): * DEPAKOTE * BENADRYL * KLONOPIN TEGRETOL SPIRIVA HANDIHALER (TIOTROPIUM BROMIDE MONOHYDRATE)    [email or web address]  Appended Document: Orders Update     Clinical Lists Changes  Orders: Added new Service order of Est. Patient 40-64 years (03474) - Signed

## 2010-12-12 NOTE — Letter (Signed)
Summary: Medical Record Release  Medical Record Release   Imported By: Harlon Flor 07/30/2010 16:25:11  _____________________________________________________________________  External Attachment:    Type:   Image     Comment:   External Document

## 2010-12-12 NOTE — Consult Note (Signed)
Summary: [facility]   Imported By: Lanelle Bal 01/14/2010 14:22:52  _____________________________________________________________________  External Attachment:    Type:   Image     Comment:   External Document

## 2010-12-12 NOTE — Letter (Signed)
Summary: Medical Clearance Letter  Medical Clearance Letter   Imported By: Harlon Flor 08/29/2010 08:11:13  _____________________________________________________________________  External Attachment:    Type:   Image     Comment:   External Document

## 2010-12-15 ENCOUNTER — Telehealth (INDEPENDENT_AMBULATORY_CARE_PROVIDER_SITE_OTHER): Payer: Self-pay | Admitting: *Deleted

## 2010-12-16 ENCOUNTER — Encounter: Payer: Self-pay | Admitting: Cardiology

## 2010-12-16 ENCOUNTER — Ambulatory Visit (HOSPITAL_COMMUNITY): Payer: Medicare PPO | Attending: Cardiology

## 2010-12-16 DIAGNOSIS — R0602 Shortness of breath: Secondary | ICD-10-CM | POA: Insufficient documentation

## 2010-12-16 DIAGNOSIS — R079 Chest pain, unspecified: Secondary | ICD-10-CM | POA: Insufficient documentation

## 2010-12-16 DIAGNOSIS — R0609 Other forms of dyspnea: Secondary | ICD-10-CM

## 2010-12-16 DIAGNOSIS — R0789 Other chest pain: Secondary | ICD-10-CM

## 2010-12-17 NOTE — Progress Notes (Signed)
Summary: alprazolam  Phone Note Refill Request Message from:  Fax from Pharmacy on December 08, 2010 1:39 PM  Refills Requested: Medication #1:  ALPRAZOLAM 1 MG TABS 1 two times a day as needed for anxiety [facility]. [phone]. Fax is on your desk.  Initial call taken by: Melody Comas,  December 08, 2010 1:41 PM  Follow-up for Phone Call        Got 3 month supply with refill in September, plus #30 emergency later She should not be due for at least a month Cindee Salt MD  December 08, 2010 2:05 PM   Spoke with patient and advised results. Faxed denied request back to [facility] Follow-up by: Mervin Hack CMA Duncan Dull),  December 08, 2010 3:05 PM

## 2010-12-24 ENCOUNTER — Telehealth: Payer: Self-pay | Admitting: Internal Medicine

## 2010-12-25 NOTE — Assessment & Plan Note (Signed)
Summary: Cardiology Nuclear Testing  Nuclear Med Background Indications for Stress Test: Evaluation for Ischemia   History: COPD, Echo, Heart Catheterization, Myocardial Perfusion Study  History Comments: '95 Cath:[facility](no report), pt. states minimal CAD; '10 JXB:JYNWGN, EF=77%; 12/11 Echo:normal LVF, mild-moderate AI  Symptoms: Chest Pain, Chest Pain with Exertion, Chest Pressure, Chest Pressure with Exertion, Diaphoresis, Dizziness, DOE, Fatigue, Nausea, Near Syncope, Palpitations, Rapid HR  Symptoms Comments: Last episode of FA:OZHY night.   Nuclear Pre-Procedure Cardiac Risk Factors: Hypertension, Lipids, Smoker Caffeine/Decaff Intake: none NPO After: 7:00 PM Lungs: Clear. IV 0.9% NS with Angio Cath: 20g     IV Site: R Hand IV Started by: Cathlyn Parsons, RN Chest Size (in) 34     Height (in): 60 Weight (lb): 108 BMI: 21.17  Nuclear Med Study 1 or 2 day study:  1 day     Stress Test Type:  Stress Reading MD:  Olga Millers, MD     Referring MD:  Marca Ancona, MD Resting Radionuclide:  Technetium 38m Tetrofosmin     Resting Radionuclide Dose:  11.0 mCi  Stress Radionuclide:  Technetium 65m Tetrofosmin     Stress Radionuclide Dose:  33.0 mCi   Stress Protocol Exercise Time (min):  9:15 min     Max HR:  123 bpm     Predicted Max HR:  169 bpm  Max Systolic BP: 133 mm Hg     Percent Max HR:  72.78 %     METS: 7.8 Rate Pressure Product:  86578    Stress Test Technologist:  Rea College, CMA-N     Nuclear Technologist:  Domenic Polite, CNMT  Rest Procedure  Myocardial perfusion imaging was performed at rest 45 minutes following the intravenous administration of Technetium 36m Tetrofosmin.  Stress Procedure  The patient attempted to walk the treadmill utilizing the Bruce protocol, but was unable to obtain her target heart rate.  She was then given IV Lexiscan 0.4 mg over 15-seconds with concurrent low level exercise and then Technetium 55m Tetrofosmin was injected at  30-seconds.  There were ST-T wave changes and she c/o chest pressure, 2/10, with infusion.  There was a slight drop in BP prior to infusion.  Quantitative spect images were obtained after a 45 minute delay.  QPS Raw Data Images:  Acquisition technically good; normal left ventricular size. Stress Images:  Normal homogeneous uptake in all areas of the myocardium. Rest Images:  Normal homogeneous uptake in all areas of the myocardium. Subtraction (SDS):  No evidence of ischemia. Transient Ischemic Dilatation:  0.98  (Normal <1.22)  Lung/Heart Ratio:  0.33  (Normal <0.45)  Quantitative Gated Spect Images QGS EDV:  71 ml QGS ESV:  19 ml QGS EF:  73 % QGS cine images:  Normal wall motion.   Overall Impression  Exercise Capacity: Lexiscan with low level exercise BP Response: Normal blood pressure response. Clinical Symptoms: There is chest pain ECG Impression: No diagnostic ST segment change suggestive of ischemia. Overall Impression: Normal lexiscan nuclear study with no ischemia or infarction.  Appended Document: Cardiology Nuclear Testing normal study  Appended Document: Cardiology Nuclear Testing notified pt of results. /MES

## 2010-12-25 NOTE — Progress Notes (Signed)
Summary: Nuclear Pre-Procedure  Phone Note Outgoing Call   Call placed by: Milana Na, EMT-P,  December 15, 2010 1:35 PM Summary of Call: Reviewed information on Myoview Information Sheet (see scanned document for further details).  Spoke with patient.     Nuclear Med Background Indications for Stress Test: Evaluation for Ischemia   History: COPD, Echo, Heart Catheterization, Myocardial Perfusion Study  History Comments: '95 Cath:[facility](no report); '07 MPS: ? LV Dilation, no ischemia,  EF 50%  Symptoms: Chest Pain, Chest Pressure, Chest Tightness, Diaphoresis, Dizziness, DOE, Fatigue, Nausea, Near Syncope, Palpitations, Rapid HR, SOB    Nuclear Pre-Procedure Cardiac Risk Factors: Hypertension, Smoker Height (in): 60  Nuclear Med Study Referring MD:  Marca Ancona, MD

## 2010-12-25 NOTE — Assessment & Plan Note (Signed)
Summary: crs/dx chest pain/sob/wt 114/[employer] MCR, JYNW#[id number]   Nurse Visit   Allergies: 1)  * Depakote 2)  * Benadryl 3)  * Klonopin 4)  Tegretol 5)  Spiriva Handihaler (Tiotropium Bromide Monohydrate)

## 2011-01-02 ENCOUNTER — Encounter: Payer: Self-pay | Admitting: Cardiology

## 2011-01-02 ENCOUNTER — Ambulatory Visit (INDEPENDENT_AMBULATORY_CARE_PROVIDER_SITE_OTHER): Payer: Medicare PPO | Admitting: Cardiology

## 2011-01-02 DIAGNOSIS — R0989 Other specified symptoms and signs involving the circulatory and respiratory systems: Secondary | ICD-10-CM | POA: Insufficient documentation

## 2011-01-02 DIAGNOSIS — R0609 Other forms of dyspnea: Secondary | ICD-10-CM

## 2011-01-06 NOTE — Progress Notes (Signed)
Summary: alprazolam  Phone Note Refill Request Message from:  Fax from Pharmacy on December 24, 2010 1:52 PM  Refills Requested: Medication #1:  ALPRAZOLAM 1 MG TABS 1 two times a day as needed for anxiety   Supply Requested: 1 [facility]    Method Requested: Fax to Mail Away Pharmacy Initial call taken by: Benny Lennert CMA Duncan Dull),  December 24, 2010 1:52 PM  Follow-up for Phone Call        Also, pt would like a few sent to [facility] while she is waiting for mail order.               [name] CMA, AAMA  December 25, 2010 8:58 AM   No fax on my desk Okay #30 x 0 locally and find the fax Cindee Salt MD  December 25, 2010 12:40 PM   Local rx sent to [facility] form on your desk. DeShannon Katrinka Blazing CMA Duncan Dull)  December 26, 2010 8:30 AM   Please call She is really not due again till mid  April now I cannot refill the large Rx before then We should probably only fill this locally if she is going to use too many (or I will have to stop prescribing) Cindee Salt MD  December 26, 2010 10:14 AM   Spoke with patient and advised results. Pt will call when she gets her larger rx form mail order, per pt she only uses 2-3 per day. I advised she wouldn't be due again until April. DeShannon Smith CMA Duncan Dull)  December 29, 2010 10:36 AM   patient called back and asked that her rx for mail order be sent to [facility], per pt it's time for her refill. I called [facility] to see when she got her last refill of #30 and it was 12/25/2010, also per [facility] pt never got #30 filled on 10/01/2010. I tried to explain to pt that on 07/30/2010 she got 180x1 which means of refills which should last until March, plus she got a extra 30day supply on 12/25/2010. Pt states she is out and needs her refills and that she only use 2 per day. Please advise. DeShannon Smith CMA Duncan Dull)  December 29, 2010 11:40 AM    Sorry, we have meticulous computer records and we cannot fill any  more till about 1 month from now Cindee Salt MD  December 29, 2010 1:45 PM   spoke with patient and advised results, per pt she doesn't have enough to last that long, but she will wait. Follow-up by: Mervin Hack CMA Duncan Dull),  December 30, 2010 9:48 AM    Prescriptions: ALPRAZOLAM 1 MG TABS (ALPRAZOLAM) 1 two times a day as needed for anxiety  #30 x 0   Entered by:   Mervin Hack CMA (AAMA)   Authorized by:   Cindee Salt MD   Signed by:   Mervin Hack CMA (AAMA) on 12/25/2010   Method used:   Telephoned to ...       [facility] [address] [phone]* (retail)       [address] [place], [place]  [address]       Ph: [phone]       Fax: [phone]   RxID:   [id number]

## 2011-01-15 ENCOUNTER — Encounter (INDEPENDENT_AMBULATORY_CARE_PROVIDER_SITE_OTHER): Payer: Medicare PPO

## 2011-01-15 ENCOUNTER — Encounter: Payer: Self-pay | Admitting: Cardiology

## 2011-01-15 DIAGNOSIS — R0989 Other specified symptoms and signs involving the circulatory and respiratory systems: Secondary | ICD-10-CM

## 2011-01-15 NOTE — Assessment & Plan Note (Signed)
Summary: F1M/AMD      Allergies Added:   Visit Type:  Follow-up Referring Provider:  Dr. Shirlee Latch Primary Provider:  Tillman Abide, MD  CC:  c/o neck and arm & hand pain.  Has shortness of breath & chest pain.Molly Peters  History of Present Illness: 50 yo with aortic insufficiency and COPD returns for followup. She gets short of breath taking a shower, getting dressed, pushing her trash can to the street.  She can walk < 1/4 mile on flat ground.  Sometimes she gets chest pain "like something sitting on my chest."  This usually occurs with emotional stress.  This tends to last for about 5-10 minutes.  Echo showed stable mild to moderate aortic insufficiency.  I had her get an ETT-myoview.  She actually exercised for 9'15".  It had to be converted to [facility] as she did not get her heart rate to goal.  There was no evidence for ischemia or infarction. She has cut her smoking back to about 5 cigs/day.  Main problem recently has been back pain and sciatica.   Labs (11/10): K 4.5, creatinine 1.2, TSH normal Labs (12/10): TGs 244, LDL 156, HDL 49, BNP 12 Labs (2/11): K 4.5, creatinine 0.81, TGs 329, HDL 35, LDL 31 Labs (12/11): K 4.2, creatinine 1.0, TSH normal, LDL 66, HDL 46  ECG: NSR, LAE, rSR' V1  Current Medications (verified): 1)  Losartan Potassium-Hctz 100-12.5 Mg Tabs (Losartan Potassium-Hctz) .... Take 1 Tablet By Mouth Once A Day 2)  Alprazolam 1 Mg Tabs (Alprazolam) .Molly Peters.. 1 Two Times A Day As Needed For Anxiety 3)  Lamictal 100 Mg Tabs (Lamotrigine) .Molly Peters.. 1 By Mouth Two Times A Day 4)  Paroxetine Hcl 20 Mg  Tabs (Paroxetine Hcl) .... Take One By Mouth Once A Day 5)  Cyclobenzaprine Hcl 5 Mg Tabs (Cyclobenzaprine Hcl) .Molly Peters.. 1 Three Times A Day As Needed  For Fibromyalgia 6)  Simvastatin 20 Mg Tabs (Simvastatin) .... Take One Tablet By Mouth Daily At Bedtime 7)  Prilosec Otc 20 Mg Tbec (Omeprazole Magnesium) .... Take  One 30-60 Min Before First Meal of The Day 8)  Percocet 7.5-325 Mg Tabs  (Oxycodone-Acetaminophen) .Molly Peters.. 1 Two Times A Day As Needed 9)  Aspirin 81 Mg Tbec (Aspirin) .... Take One Tablet By Mouth Daily  Allergies (verified): 1)  * Depakote 2)  * Benadryl 3)  * Klonopin 4)  Tegretol 5)  Spiriva Handihaler (Tiotropium Bromide Monohydrate)  Past History:  Past Surgical History: Last updated: 10/21/2010 ECHO 1995 Cardiac Cath Hysterectomy Breast bx- several  ~ 1989 + L-S fusion with cage - [place] 07/03 Echo 08/03 Myoview- EF 52%, mild LVH 12/05 EGD 05/06 Lumbar decomp/ cyst removal 01/08 Lumbar spine surgery--cyst removed/rod inserted 1/11    Dr Lieutenant Diego myoview (10/2009): EF 77%, normal wall motion, normal perfusion.   Family History: Last updated: 06/23/2010 Dad fairly healthy Mom with rhematoid arthritis Brother and sister with migraines CAD in both sets of grandparents DM and HTN  in both sides also Family History of Colon Cancer:uncle Family History of Breast Cancer: aunts (multiple) Family History of Ovarian Cancer:great aunt Family History of Stomach Cancer:? Family History of Colitis/Crohn's/IBS :sister (likely IBS) asthma-daughter Cervical Cancer - mother and sister  Social History: Last updated: 12/01/2010 Current Smoker, 1ppd x38 yrs, now down to 4 cigs/day Divorced then remarried 2001 Children:3 Alcohol use-no Disabled since 2003 Daily Caffeine Use Marijuana use-occ  Risk Factors: Caffeine Use: 5+ (09/19/2008)  Risk Factors: Smoking Status: current (10/21/2010) Packs/Day: 1 (10/31/2008)  Passive Smoke Exposure: yes (10/31/2008)  Past Medical History: 1.  COPD: smokes 1 ppd      - PFT's 06/19/10 FEV1 1.97 (91%) ratio 73 and no better after B2,  DLC0 57% 2. Depression 3. GERD 4. Seizure disorder 5. Valvular heart disease: Initial workup a number of years ago at [facility].  Echo (10/2009) showed EF 60-65%,      normal LV size, moderate aortic insufficiency with a trileaflet aortic valve and normal aortic root size.   Mild      mitral regurgitation.  Repeat echo (12/11): EF 55%, mild-moderate AI, mild MR, normal RV, normal PA systolic pressure.  6. Anxiety/Panic disorder 7. Glaucoma 8. Hemorrhoids 9. Fibromyalgia 10. Anal Fissure 11. Arthritis 12. Chronic Headaches 13. Hypertension 14. IBS 15. Chest pain: Lexiscan myoview in 2010 showed no ischemia or infarction.  ETT-myoview (2/12): 9'15", had to convert to Lexiscan due to inability to reach goal HR, EF 73%, no ischemia or infarction.   Family History: Reviewed history from 06/23/2010 and no changes required. Dad fairly healthy Mom with rhematoid arthritis Brother and sister with migraines CAD in both sets of grandparents DM and HTN  in both sides also Family History of Colon Cancer:uncle Family History of Breast Cancer: aunts (multiple) Family History of Ovarian Cancer:great aunt Family History of Stomach Cancer:? Family History of Colitis/Crohn's/IBS :sister (likely IBS) asthma-daughter Cervical Cancer - mother and sister  Social History: Reviewed history from 12/01/2010 and no changes required. Current Smoker, 1ppd x38 yrs, now down to 4 cigs/day Divorced then remarried 2001 Children:3 Alcohol use-no Disabled since 2003 Daily Caffeine Use Marijuana use-occ  Review of Systems       All systems reviewed and negative except as per HPI.   Vital Signs:  Patient profile:   49 year old female Menstrual status:  hysterectomy Height:      60 inches Weight:      110 pounds BMI:     21.56 Pulse rate:   83 / minute BP sitting:   135 / 90  (left arm) Cuff size:   regular  Vitals Entered By: Bishop Dublin, CMA (January 02, 2011 3:40 PM)  Physical Exam  General:  Well developed, well nourished, in no acute distress.  Thin.  Neck:  Neck supple, no JVD. No masses, thyromegaly or abnormal cervical nodes. Lungs:  Clear bilaterally to auscultation and percussion. Heart:  Non-displaced PMI, chest non-tender; regular rate and rhythm, S1,  S2 without murmurs, rubs. +S4. Carotid upstroke normal, left carotid bruit.  Pedals normal pulses. No edema, no varicosities. Abdomen:  Bowel sounds positive; abdomen soft and non-tender without masses, organomegaly, or hernias noted. No hepatosplenomegaly. Extremities:  No clubbing or cyanosis. Neurologic:  Alert and oriented x 3. Psych:  Normal affect.   Impression & Recommendations:  Problem # 1:  CHEST PAIN UNSPECIFIED (ICD-786.50) Suspect that this was noncardiac.  Myoview showed no evidence for ischemia or infarction.   Problem # 2:  CAROTID BRUIT, LEFT (ICD-785.9) Will get carotid dopplers.   Problem # 3:  SMOKER (ICD-305.1) Strongly encouraged her to quit.  She will try an electronic cigarette.   Problem # 4:  HYPERTENSION (ICD-401.9) BP reasonably controlled.   Problem # 5:  VALVULAR HEART DISEASE (ICD-424.90) Mild to moderate AI on last echo in 12/11.  Repeat echo in 1 year.   Problem # 6:  COPD (ICD-496) Suspect that dyspnea is primarily related to lung issues.   Other Orders: Carotid Duplex (Carotid Duplex) Echocardiogram (Echo)  Patient Instructions: 1)  Your physician recommends that you schedule a follow-up appointment in: 1 year 2)  TO BE SCHEDULED FIRST AVAILABLE:  Your physician has requested that you have a carotid duplex. This test is an ultrasound of the carotid arteries in your neck. It looks at blood flow through these arteries that supply the brain with blood. Allow one hour for this exam. There are no restrictions or special instructions. 3)  Your physician has requested that you have an echocardiogram.  TO BE SCHEDULED 10/2011:  Echocardiography is a painless test that uses sound waves to create images of your heart. It provides your doctor with information about the size and shape of your heart and how well your heart's chambers and valves are working.  This procedure takes approximately one hour. There are no restrictions for this procedure.

## 2011-01-16 ENCOUNTER — Telehealth: Payer: Self-pay | Admitting: Internal Medicine

## 2011-01-21 ENCOUNTER — Telehealth: Payer: Self-pay | Admitting: Internal Medicine

## 2011-01-22 ENCOUNTER — Telehealth: Payer: Self-pay | Admitting: Internal Medicine

## 2011-01-23 ENCOUNTER — Ambulatory Visit: Payer: Self-pay | Admitting: Neurosurgery

## 2011-01-25 LAB — BASIC METABOLIC PANEL
CO2: 27 mEq/L (ref 19–32)
Chloride: 103 mEq/L (ref 96–112)
Chloride: 104 mEq/L (ref 96–112)
Creatinine, Ser: 0.93 mg/dL (ref 0.4–1.2)
Creatinine, Ser: 0.8 mg/dL (ref 0.4–1.2)
GFR calc Af Amer: >60 mL/min (ref >60)
GFR calc Af Amer: >60 mL/min (ref >60)
Glucose, Bld: 77 mg/dL (ref 70–99)

## 2011-01-25 LAB — CBC
MCHC: 34.5 g/dL (ref 30.0–36.0)
MCV: 95.1 fL (ref 78.0–100.0)
MCV: 94.4 fL (ref 78.0–100.0)
RBC: 4.03 MIL/uL (ref 3.87–5.11)
RBC: 3.77 MIL/uL — ABNORMAL LOW (ref 3.87–5.11)
WBC: 9.8 10*3/uL (ref 4.0–10.5)

## 2011-01-25 LAB — URINALYSIS, ROUTINE W REFLEX MICROSCOPIC
Glucose, UA: NEGATIVE mg/dL
Protein, ur: NEGATIVE mg/dL
Specific Gravity, Urine: 1.024 (ref 1.005–1.030)

## 2011-01-25 LAB — TYPE AND SCREEN
ABO/RH(D): B POS
ABO/RH(D): B POS
Antibody Screen: NEGATIVE

## 2011-01-25 LAB — POCT I-STAT 4, (NA,K, GLUC, HGB,HCT)
Glucose, Bld: 91 mg/dL (ref 70–99)
Hemoglobin: 10.2 g/dL — ABNORMAL LOW (ref 12.0–15.0)
Potassium: 3.9 mEq/L (ref 3.5–5.1)
Sodium: 140 mEq/L (ref 135–145)

## 2011-01-25 LAB — URINE MICROSCOPIC-ADD ON

## 2011-01-26 ENCOUNTER — Telehealth: Payer: Self-pay | Admitting: Internal Medicine

## 2011-01-27 NOTE — Progress Notes (Signed)
Summary: alprazolam   Phone Note Refill Request Call back at Home Phone [phone] Message from:  Patient on January 16, 2011 10:38 AM  Refills Requested: Medication #1:  ALPRAZOLAM 1 MG TABS 1 two times a day as needed for anxiety Patient is asking for rx to be sent to [facility]. Form in your IN box.She is also asking if she could get a 2 week supply to last until she get rx from mail order. She would [facility].  Initial call taken by: Melody Comas,  January 16, 2011 10:39 AM  Follow-up for Phone Call        okay #180 x 0 to [facility] No extra since she has gotten #60 extra already since 6 month Rx in September  Follow-up by: Cindee Salt MD,  January 19, 2011 1:24 PM  Additional Follow-up for Phone Call Additional follow up Details #1::        [facility]ed [facility] Additional Follow-up by: DeShannon Smith CMA Duncan Dull),  January 19, 2011 2:41 PM    Prescriptions: ALPRAZOLAM 1 MG TABS (ALPRAZOLAM) 1 two times a day as needed for anxiety  #180 x 0   Entered by:   Mervin Hack CMA (AAMA)   Authorized by:   Cindee Salt MD   Signed by:   Mervin Hack CMA (AAMA) on 01/19/2011   Method used:   Handwritten   RxID:   [id number]

## 2011-01-27 NOTE — Progress Notes (Signed)
Summary: regarding xanax  Phone Note Call from Patient Call back at Home Phone [phone]   Caller: Patient Call For: Cindee Salt MD Summary of Call: Pt states she called [facility] and they are still telling her they dont have the xanax script that was faxed in.  Ok to send again?         [name] CMA, AAMA  January 22, 2011 12:57 PM   Follow-up for Phone Call        yes  It would be much better if she gets controlled substances filled locally Follow-up by: Cindee Salt MD,  January 22, 2011 1:59 PM  Additional Follow-up for Phone Call Additional follow up Details #1::        [facility]ed [facility] to [facility]. Additional Follow-up by: Mervin Hack CMA Duncan Dull),  January 23, 2011 8:51 AM

## 2011-01-27 NOTE — Progress Notes (Signed)
Summary: Cyclobenzaprine 5mg   Phone Note Refill Request Call back at fax [phone] Message from:  [facility] on January 16, 2011 2:40 PM  Refills Requested: Medication #1:  CYCLOBENZAPRINE HCL 5 MG TABS 1 three times a day as needed  for fibromyalgia [facility] faxed refill request form for Cyclobenzaprine  5mg . The form is on your desk in your in box.   Method Requested: Fax to Mail Away Pharmacy Initial call taken by: Lewanda Rife LPN,  January 16, 2011 2:41 PM  Follow-up for Phone Call        okay #270 x 0 Follow-up by: Cindee Salt MD,  January 19, 2011 1:20 PM  Additional Follow-up for Phone Call Additional follow up Details #1::        Rx [facility]ed to [facility] Additional Follow-up by: DeShannon Smith CMA Duncan Dull),  January 19, 2011 2:41 PM    Prescriptions: CYCLOBENZAPRINE HCL 5 MG TABS (CYCLOBENZAPRINE HCL) 1 three times a day as needed  for fibromyalgia  #270 x 0   Entered by:   Mervin Hack CMA (AAMA)   Authorized by:   Cindee Salt MD   Signed by:   Mervin Hack CMA (AAMA) on 01/19/2011   Method used:   Handwritten   RxID:   [id number]

## 2011-01-27 NOTE — Progress Notes (Signed)
Summary: pt is out of xanax  Phone Note Call from Patient Call back at Home Phone [phone]   Caller: Patient Summary of Call: Pt is asking if she can get a few extra xanax, even if only 5, while she is waiting for mail order to arrive.  Refill was [facility] to [facility] on monday, but they are telling her they dont have that.  I advised her to call them and tell them that we did fax it on monday, and ask them to find it.  She is out of xanax, as of this morning.  [facility]ses [facility]. Initial call taken by: [name] CMA, AAMA,  January 21, 2011 9:11 AM  Follow-up for Phone Call        she has used more than her prescribed amount and that will not be tolerated if she does this again, I will no longer prescribe this for her Have her sign controlled substances agreement if she hasn't already  Okay #5 x 0 Follow-up by: Cindee Salt MD,  January 21, 2011 9:40 AM  Additional Follow-up for Phone Call Additional follow up Details #1::        spoke with patient and advised that she can only have 5 tablets and that if this continue Dr.Staci Carver will not prescribe anymore. Pt stated she will make an appt to come in to discuss with Dr.Rudi Bunyard. rx [facility] to [facility]. Additional Follow-up by: Mervin Hack CMA Duncan Dull),  January 21, 2011 9:48 AM    Prescriptions: ALPRAZOLAM 1 MG TABS (ALPRAZOLAM) 1 two times a day as needed for anxiety  #5 x 0   Entered by:   Mervin Hack CMA (AAMA)   Authorized by:   Cindee Salt MD   Signed by:   Mervin Hack CMA (AAMA) on 01/21/2011   Method used:   Telephoned to ...       [facility] [address] [phone]* (retail)       [address] [place], [place]  [address]       Ph: [phone]       Fax: [phone]   RxID:   [id number]

## 2011-01-30 ENCOUNTER — Other Ambulatory Visit: Payer: Self-pay | Admitting: Internal Medicine

## 2011-01-30 NOTE — Telephone Encounter (Signed)
Okay #90 x 0 

## 2011-02-04 ENCOUNTER — Encounter (HOSPITAL_COMMUNITY)
Admission: RE | Admit: 2011-02-04 | Discharge: 2011-02-04 | Disposition: A | Discharge status: Home / Self Care | Payer: Medicare PPO | Source: Ambulatory Visit | Attending: Neurosurgery | Admitting: Neurosurgery

## 2011-02-04 ENCOUNTER — Telehealth: Payer: Self-pay | Admitting: Cardiology

## 2011-02-04 DIAGNOSIS — Z01812 Encounter for preprocedural laboratory examination: Secondary | ICD-10-CM | POA: Insufficient documentation

## 2011-02-04 LAB — CBC
HCT: 37.6 % (ref 36.0–46.0)
Hemoglobin: 12.9 g/dL (ref 12.0–15.0)
RBC: 4.27 MIL/uL (ref 3.87–5.11)
WBC: 9.8 10*3/uL (ref 4.0–10.5)

## 2011-02-04 LAB — SURGICAL PCR SCREEN
MRSA, PCR: NEGATIVE
Staphylococcus aureus: POSITIVE — AB

## 2011-02-04 LAB — BASIC METABOLIC PANEL
Chloride: 100 mEq/L (ref 96–112)
Creatinine, Ser: 0.97 mg/dL (ref 0.4–1.2)
GFR calc Af Amer: >60 mL/min (ref >60)
GFR calc non Af Amer: >60 mL/min (ref >60)
Potassium: 3.4 mEq/L — ABNORMAL LOW (ref 3.5–5.1)

## 2011-02-04 NOTE — Telephone Encounter (Signed)
Faxed OV & Stress to Joyce Gross (per Delice Bison) at [facility] ([phone]).

## 2011-02-05 NOTE — Progress Notes (Signed)
Summary: refill request for simvastatin  Phone Note Refill Request Message from:  Fax from Pharmacy  Refills Requested: Medication #1:  SIMVASTATIN 20 MG TABS Take one tablet by mouth daily at bedtime Faxed form from [facility] is on your desk.  This was recently faxed in, perhaps they didnt get it.  Initial call taken by: [name] CMA, AAMA,  January 26, 2011 3:56 PM  Follow-up for Phone Call        okay x 1 year Follow-up by: Cindee Salt MD,  January 26, 2011 5:18 PM  Additional Follow-up for Phone Call Additional follow up Details #1::        Rx faxed to pharmacy Additional Follow-up by: DeShannon Smith CMA Duncan Dull),  January 26, 2011 5:40 PM    Prescriptions: SIMVASTATIN 20 MG TABS (SIMVASTATIN) Take one tablet by mouth daily at bedtime  #90 x 3   Entered by:   Mervin Hack CMA (AAMA)   Authorized by:   Cindee Salt MD   Signed by:   Mervin Hack CMA (AAMA) on 01/26/2011   Method used:   Handwritten   RxID:   [id number]

## 2011-02-06 ENCOUNTER — Other Ambulatory Visit: Payer: Self-pay | Admitting: *Deleted

## 2011-02-06 ENCOUNTER — Inpatient Hospital Stay (HOSPITAL_COMMUNITY)
Admission: RE | Admit: 2011-02-06 | Discharge: 2011-02-08 | DRG: 473 | Disposition: A | Discharge status: Home / Self Care | Payer: Medicare PPO | Source: Ambulatory Visit | Attending: Neurosurgery | Admitting: Neurosurgery

## 2011-02-06 ENCOUNTER — Inpatient Hospital Stay (HOSPITAL_COMMUNITY): Payer: Medicare PPO

## 2011-02-06 DIAGNOSIS — M47812 Spondylosis without myelopathy or radiculopathy, cervical region: Principal | ICD-10-CM | POA: Diagnosis present

## 2011-02-06 HISTORY — PX: ANTERIOR CERVICAL DECOMP/DISCECTOMY FUSION: SHX1161

## 2011-02-06 NOTE — Telephone Encounter (Signed)
Just done March 12th Okay #270 x 0 if that didn't go through

## 2011-02-09 NOTE — Telephone Encounter (Signed)
Call patient Let her know that this will not be covered under her insurance  She can get it on the $4/$10 plan at local stores [place], [place], [employer]) Not due for Rx yet though

## 2011-02-10 NOTE — Telephone Encounter (Signed)
Spoke with patient and she will check with the local pharmacy for prices, and she will discuss with you on her follow-up visit. Per patient she has 2 bottles of flexaril now and should be ok for a little while.

## 2011-02-12 NOTE — Op Note (Signed)
NAME:  Molly Peters, Molly Peters            ACCOUNT NO.:  [id number]  MEDICAL RECORD NO.:  [id number]           PATIENT TYPE:  I  LOCATION:  3027                         FACILITY:  [facility]  PHYSICIAN:  Coletta Memos, M.D.     DATE OF BIRTH:  1961/03/06  DATE OF PROCEDURE:  02/06/2011 DATE OF DISCHARGE:                              OPERATIVE REPORT   PREOPERATIVE DIAGNOSES: 1. Cervical spondylosis C4-5, C5-6, C6-7. 2. Cervical radiculopathy. 3. Cervicalgia.  POSTOPERATIVE DIAGNOSES: 1. Cervical spondylosis C4-5, C5-6, C6-7. 2. Cervical radiculopathy. 3. Cervicalgia.  PROCEDURE: 1. Anterior cervical decompression C4-5, C5-6, C6-7.  Structural     allograft 5 mm, C4-5; 6 mm C5-6 and C6-7. 2. Anterior instrumentation, Vectra plate from C4 to C7.  COMPLICATIONS:  None.  SURGEON:  Coletta Memos, MD  ASSISTANT:  Hewitt Shorts, MD  ANESTHESIA:  General endotracheal.  INDICATIONS:  Molly Peters is a long-term patient of mine whom I havetaking care of for number of years.  She has significant cervical spondylitic change and severe neck pain along with foraminal narrowing. She does have spondylolisthesis at C3-4, which did not move in any significant way of flexion/extension films.  I therefore opted to simply do a 3-level anterior cervical decompression and arthrodesis, understanding that C3-4 may go bad in the future, which would have been the case even if C3-4 being completely normal.  OPERATIVE NOTE:  Molly Peters was brought to the operating room, intubated and placed under general anesthetic.  She had a neck positioned on a horseshoe and neutral with 5 pounds of traction applied via chin strap.  The neck was prepped and she was draped in a sterile fashion.  I opened the incision with a #10 blade starting at the midline extending to the sternocleidomastoid on the left side at the cricothyroid membrane.  I used both blunt and sharp techniques for dissection, but I opened the  platysma horizontally with Metzenbaum scissors and then created an avascular corridor to the cervical spine. I have placed spinal needle and was able to confirm my location.  I reflected the longus colli muscles bilaterally.  Subsequent to the x- ray, and placed self-retaining retractors.  I opened the disk spaces of C5-6 and C4-5.  I placed distraction pins, one at C4, one at C5 and proceeded with my decompression of the spinal canal at C3-4.  I decompressed the spinal canal in both C5 nerve roots by using a drill, Kerrison punch, curette and a Leksell rongeur.  There was a great deal of redundant ligament and some partially calcified ligament.  The bone spurs were impressive also and I used a drill along with a Kerrison punch to remove those, but I was able to fully decompress the nerve root of C5 bilaterally and the spinal canal.  I sized the space and felt that the 5-mm structural allograft would be best.  I irrigated.  I placed a 5- mm structural allograft after preparing the endplates for arthrodesis with a drill.  After completing the arthrodesis at C4-5, I moved down one level.  I removed it from C4 and placed it into the vertebral body of C6.  I distracted the C5-6 space.  I now decompressed the spinal canal at C5-6.  I decompressed the spinal canal at C5-6 using Kerrison punches, drill, curettes and pituitary rongeurs.  Once again, great deal of redundant ligamentum flavum, very tight neural foramina bilaterally.  I used a drill to remove the osteophytes and then around the uncovertebral joints to make sure that there was free egress as there had been at the previously operated level.  Determining with the microscope in place using microdissection, that both C6 roots were well decompressed.  I made sure that the spinal canal was decompressed.  That was completed with the Kerrison punch and the drills.  I prepared for the arthrodesis. I used a drill and prepared the endplates,  evening the surfaces so that they would accept the tricortical structural graft.  I placed a size 6- mm graft at C5-6.  I removed the pin at C5 and placed that into the C7 vertebral body.  I decompressed the spinal canal at C6-7 by opening the disk space with a #15 blade using curettes and then distraction from the pins that were placed.  I used a drill, Kerrison punch, pituitary rongeurs and the curette to fully decompress the spinal canal at C6-7.  Again, no difference in the amount of redundant ligament and the size of the posterior osteophytes from both C6 and C7.  Once I infected full decompression of spinal canal, I made sure that my nerve roots were also both well decompressed.  I was able to do this with the Kerrison punch and the drills.  I prepared for arthrodesis.  I used a drill to level the surfaces at C6 and C7 so that they would accept a graft.  I placed another 6-mm structural allograft into the space.  I now need Dr. Earl Gala help proceed with my instrumentation.  I placed a 3-level Vectra, I believe 45 mm in length, but that could be in correct and placed 2 screws at C5, 2 screws at C4, 2 screws at C7, and C2 screws at C6.  X-ray showed the plate, plug, and screws to be in good position.  I made sure that we had obtained hemostasis.  We then closed the wound in layered fashion using Vicryl sutures to reapproximate the platysma and subcuticular layers.  I used Dermabond for sterile dressing.          ______________________________ Coletta Memos, M.D.     KC/MEDQ  D:  02/06/2011  T:  02/07/2011  Job:  [id number]  Electronically Signed by Coletta Memos M.D. on 02/12/2011 01:16:47 PM

## 2011-02-23 ENCOUNTER — Emergency Department (HOSPITAL_COMMUNITY): Payer: Medicare PPO

## 2011-02-23 ENCOUNTER — Emergency Department (HOSPITAL_COMMUNITY)
Admission: EM | Admit: 2011-02-23 | Discharge: 2011-02-24 | Disposition: A | Discharge status: Home / Self Care | Payer: Medicare PPO | Attending: Emergency Medicine | Admitting: Emergency Medicine

## 2011-02-23 DIAGNOSIS — R209 Unspecified disturbances of skin sensation: Secondary | ICD-10-CM | POA: Insufficient documentation

## 2011-02-23 DIAGNOSIS — M79609 Pain in unspecified limb: Secondary | ICD-10-CM | POA: Insufficient documentation

## 2011-02-23 DIAGNOSIS — M542 Cervicalgia: Secondary | ICD-10-CM | POA: Insufficient documentation

## 2011-02-23 DIAGNOSIS — F411 Generalized anxiety disorder: Secondary | ICD-10-CM | POA: Insufficient documentation

## 2011-02-23 DIAGNOSIS — Z79899 Other long term (current) drug therapy: Secondary | ICD-10-CM | POA: Insufficient documentation

## 2011-02-23 DIAGNOSIS — E7800 Pure hypercholesterolemia, unspecified: Secondary | ICD-10-CM | POA: Insufficient documentation

## 2011-02-23 DIAGNOSIS — R29898 Other symptoms and signs involving the musculoskeletal system: Secondary | ICD-10-CM | POA: Insufficient documentation

## 2011-02-23 DIAGNOSIS — Z9889 Other specified postprocedural states: Secondary | ICD-10-CM | POA: Insufficient documentation

## 2011-02-23 DIAGNOSIS — M25519 Pain in unspecified shoulder: Secondary | ICD-10-CM | POA: Insufficient documentation

## 2011-03-20 ENCOUNTER — Encounter: Payer: Self-pay | Admitting: Internal Medicine

## 2011-03-23 ENCOUNTER — Encounter: Payer: Medicare PPO | Admitting: Internal Medicine

## 2011-03-25 ENCOUNTER — Encounter: Payer: Self-pay | Admitting: Internal Medicine

## 2011-03-27 NOTE — Op Note (Signed)
NAME:  Molly Peters, Molly Peters            ACCOUNT NO.:  [id number]   MEDICAL RECORD NO.:  [id number]          PATIENT TYPE:  INP   LOCATION:  2899                         FACILITY:  [facility]   PHYSICIAN:  Coletta Memos, M.D.     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   POSTOPERATIVE DIAGNOSIS:  1. 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  2. Lumbar stenosis.  3. Spondylolisthesis L4-L5.  4. Spondylosis with degeneration.   PROCEDURE:  1. Removal of hardware at L5-S1 and exploration of fusion mass.  2. Lumbar decompression L4-5 to decompress both nerve roots and spinal      canal.  3. Posterior lumbar interbody arthrodesis with 13 mm [employer] PEEK      cages filled with morselized auto and allograft.  4. Pedicle screw nonsegmental fixation L4-L5.  5. Posterolateral arthrodesis morselized allograft and autograft.   SURGEON:  Coletta Memos, M.D.   OPERATIVE ASSISTANT:  Dr. Danielle Dess   COMPLICATIONS:  Durotomy.   INDICATIONS:  Molly Peters is a 50 year old who the presented to the  office with severe pain in the left lower extremity.  CT suggested a  mass in spinal canal.  MRI showed what was a very large synovial cyst at  the L4-5 joint.  The space was markedly stenotic.  She had significant  disk degeneration.  She had been previously fused at L5-S1 but it was  not clear whether or not she had a fusion even on CT.  I therefore  recommended and she agreed to undergo operative decompression and fusion  of L4-5 and exploration of fusion at L5-S1.   OPERATIVE NOTE:  Molly Peters brought to the operating room, intubated,  placed under general anesthetic without difficulty.  She was rolled  prone onto a Jackson table and all pressure points properly padded.  A  Foley catheter had been placed under sterile conditions.  Her back was  prepped and she was  draped in a sterile fashion.  I opened the skin with  a #10 blade took this down to the thoracolumbar fascia.  I then used  monopolar cautery and dissected out laterally until I had all of the  previous hardware which was a Zimmer system exposed.  At that time with  Dr. Verlee Rossetti assistance we worked at removing the hardware.  I explored  the L5-S1 joint to see if she did have a solid fusion and indeed she  did.  I did not have evidence of that on the preoperative CT.  Once that  was done I then worked to decompress the spinal canal by performing  laminectomy of L4.  After performing laminectomy and removing the  ligamentum flavum I then exposed the thecal sac.   The juxtafacet cysts were identified bilaterally.  They were quite large  and densely adherent to the dura.  In removing some of the annulus on  the right side there was unintentional durotomy.  Since it was anterior  it was not something that could be repaired primarily.  I used Gelfoam  during the case and finished the case by placing a piece of Duragen over  that area.   I proceeded with the lumbar decompression until both L4-L5 nerve roots  were well decompressed along with the spinal canal.  This involved a  fairly painstaking dissection of the cyst. Once that was performed I  then performed bilateral diskectomies in preparation for the PLIF.   The PLIF's were performed first by removing disk material and then soft  tissue and endplates from the vertebral bodies at L4-5.  I used the disk  space shavers provided by [employer] and calculated that I should placed 13  mm cages.  She was fishmouth secondary to the degeneration and I thought  a 13 mm would actually hold.  I removed the posterior lip using Kerrison  punches.  After removing the soft tissue I then packed morselized  autograft into the 13 mm Synthes cage and placed first one on the right  then on the left side.  That was done without difficulty.  I then turned  my  attention to the pedicle screw fixation.   With fluoroscopic guidance I then placed four pedicle screws.  I had to  use a different trajectory and a different screw as the previous screw  sites at L4 were a different type of hardware system that I used and I  did not believe I would be able to use a bigger screw down that hole.  I  did not believe that she would hold if I used the same size threads.  I  therefore placed those at L5.  I then placed two screws at L4 again  using fluoroscopic guidance.  This was done without difficulty.  Both AP  and lateral views appeared to show the screws were in good position.  I  then decorticated the transverse process and facet of L5 and L4  laterally on the right and left sides.  I then placed morselized auto  and allograft for posterolateral arthrodesis.   I then placed the rods and secured the rods in the screw heads using a  40 mm rod on the right side and a 30 mm rod on the left.   I then placed piece of Duragen over the durotomy and Tisseel.  I then  closed with a layered fashion using Vicryl sutures, reapproximated  thoracolumbar fascia, subcutaneous tissue.  I closed the wound with a 3-  0 nylon running vertical mattress suture.  Sterile dressing was applied.  The patient tolerated procedure well.           ______________________________  Coletta Memos, M.D.     KC/MEDQ  D:  11/17/2006  T:  11/18/2006  Job:  [id number]

## 2011-03-27 NOTE — Discharge Summary (Signed)
NAME:  Molly Peters, Molly Peters            ACCOUNT NO.:  [id number]   MEDICAL RECORD NO.:  [id number]          PATIENT TYPE:  INP   LOCATION:  3017                         FACILITY:  [facility]   PHYSICIAN:  Coletta Memos, M.D.     DATE OF BIRTH:  [birth year]   DATE OF ADMISSION:  11/17/2006  DATE OF DISCHARGE:  11/22/2006                               DISCHARGE SUMMARY   ADMITTING DIAGNOSES:  1. Lumbar spondylolisthesis L4-5.  2. Lumbar stenosis L4-5.  3. Lumbar radiculopathy L4-L5.  4. Lumbar spondylosis.   POSTOPERATIVE AND DISCHARGE DIAGNOSES:  1. Lumbar spondylolisthesis L4-5.  2. Lumbar stenosis L4-5.  3. Lumbar radiculopathy L4-L5.  4. Lumbar spondylosis.   DISCHARGE STATUS:  Alive and well.   DISCHARGE DESTINATION:  Home.   DISCHARGE MEDICATIONS:  Percocet and Flexeril.   COMPLICATIONS:  None.  The patient did have a dural tear during  operation.  Postoperatively, Her wound has been clean and dry.   HOSPITAL COURSE:  [name] was admitted, taken to the operating  room on November 17, 2006, for lumbar decompression and fusion of L4-5.  She had been previously fused at the L5-S1 level with a BAK cage and  pedicle screw fixation.  She developed adjacent level disease with  significant spondylolisthesis and stenosis.  Postoperatively, she has  done well.  She is still in a great deal of pain but says that she has  been in pain for the last 5 years.  We will she how she does with time.  She is voiding, tolerating a regular diet, and has no headaches which  are posturally related.  Her wound is clean, dry and without signs of  infection or discharge.  She will have return appointment to see me in  approximately 1 week for suture removal.           ______________________________  Coletta Memos, M.D.     KC/MEDQ  D:  11/22/2006  T:  11/22/2006  Job:  [id number]

## 2011-04-07 ENCOUNTER — Other Ambulatory Visit: Payer: Self-pay | Admitting: *Deleted

## 2011-04-07 NOTE — Telephone Encounter (Signed)
Form on your desk  

## 2011-04-08 MED ORDER — CYCLOBENZAPRINE HCL 5 MG PO TABS: 5.0000 mg | ORAL_TABLET | Freq: Three times a day (TID) | ORAL | Status: DC | PRN

## 2011-04-08 NOTE — Telephone Encounter (Signed)
rx faxed to pharmacy manually  

## 2011-04-08 NOTE — Telephone Encounter (Signed)
I don't see a form but okay #270 x 0

## 2011-04-13 ENCOUNTER — Telehealth: Payer: Self-pay | Admitting: *Deleted

## 2011-04-13 ENCOUNTER — Other Ambulatory Visit: Payer: Self-pay | Admitting: *Deleted

## 2011-04-13 MED ORDER — ALPRAZOLAM 1 MG PO TABS: 1.0000 mg | ORAL_TABLET | Freq: Two times a day (BID) | ORAL | Status: DC | PRN

## 2011-04-13 NOTE — Telephone Encounter (Signed)
I have not been that excited with lyrica and there can be side effects Will need appt if she wants to consider this

## 2011-04-13 NOTE — Telephone Encounter (Signed)
Xanax faxed to [facility], did not need simvastatin

## 2011-04-13 NOTE — Telephone Encounter (Signed)
Form on your desk  

## 2011-04-13 NOTE — Telephone Encounter (Signed)
Okay #180 x 0 for the xanax 1 year of simvastatin---?electronic?

## 2011-04-13 NOTE — Telephone Encounter (Signed)
Calling patient to advise that her insurance will no longer cover  Cyclobenzaprine because it's no longer on formulary. Per pt she haven't been taking it anyway, she's been taking methocarbamol 750 rx's by Dr.Cabel. Pt would like to have rx for high dose of Lyrica and would also like something for her restless legs. I advised pt that cyclobenzaprine is on the $4 list at [facility] and she states it doesn't work.

## 2011-04-15 NOTE — Telephone Encounter (Signed)
Spoke with patient and advised results, patient sch appt to discuss

## 2011-04-22 ENCOUNTER — Ambulatory Visit (INDEPENDENT_AMBULATORY_CARE_PROVIDER_SITE_OTHER): Payer: Medicare PPO | Admitting: Internal Medicine

## 2011-04-22 ENCOUNTER — Encounter: Payer: Self-pay | Admitting: Internal Medicine

## 2011-04-22 DIAGNOSIS — F411 Generalized anxiety disorder: Secondary | ICD-10-CM

## 2011-04-22 DIAGNOSIS — IMO0001 Reserved for inherently not codable concepts without codable children: Secondary | ICD-10-CM

## 2011-04-22 MED ORDER — PREGABALIN 25 MG PO CAPS: 25.0000 mg | ORAL_CAPSULE | Freq: Three times a day (TID) | ORAL | Status: DC

## 2011-04-22 NOTE — Assessment & Plan Note (Signed)
Ongoing symptoms Flexeril no help Will try the lyrica again and titrate up counselled all of 15 minute visit

## 2011-04-22 NOTE — Assessment & Plan Note (Signed)
Ongoing issues Affecting her stomach No change since starting lyrica

## 2011-04-22 NOTE — Progress Notes (Signed)
Subjective:    Patient ID: Molly Peters, female    DOB: 01-Feb-1961, 50 y.o.   MRN: [id number]  HPI Interested in going back on lyrica It did help but couldn't afford it so changed to cyclobenzaprine  Trouble with fibromyalgia and restless legs Awakens tired and with no energy  No side effects that she remembers  Feels her nerves are acting up Had to kick youngest child out--he is still pestering me  Current Outpatient Prescriptions on File Prior to Visit  Medication Sig Dispense Refill  . ALPRAZolam (XANAX) 1 MG tablet Take 1 tablet (1 mg total) by mouth 2 (two) times daily as needed.  180 tablet  0  . lamoTRIgine (LAMICTAL) 100 MG tablet Take 100 mg by mouth 2 (two) times daily.        Marland Kitchen losartan-hydrochlorothiazide (HYZAAR) 100-12.5 MG per tablet Take 1 tablet by mouth daily.        Marland Kitchen oxyCODONE-acetaminophen (PERCOCET) 7.5-325 MG per tablet Take 1 tablet by mouth 2 (two) times daily as needed.        Marland Kitchen PARoxetine (PAXIL) 20 MG tablet Take 20 mg by mouth every morning.        . simvastatin (ZOCOR) 20 MG tablet Take 20 mg by mouth at bedtime.        Marland Kitchen DISCONTD: aspirin 81 MG tablet Take 81 mg by mouth daily.        Marland Kitchen DISCONTD: cyclobenzaprine (FLEXERIL) 5 MG tablet Take 1 tablet (5 mg total) by mouth 3 (three) times daily as needed.  270 tablet  0  . DISCONTD: omeprazole (PRILOSEC) 20 MG capsule Take 20 mg by mouth daily.         Past Medical History  Diagnosis Date  . COPD (chronic obstructive pulmonary disease)   . Depression   . GERD (gastroesophageal reflux disease)   . Anxiety   . Glaucoma   . Arthritis   . Hypertension   . Seizure disorder   . Valvular heart disease     Initial workup a number of years ago at [facility].  Echo (10/2009) showed EF 60-65%,      normal LV size, moderate aortic insufficiency with a trileaflet aortic valve and normal aortic root size.  Mild      mitral regurgitation.   . Hemorrhoids   . Fibromyalgia   . Anal fissure   . Chronic headaches     . IBS (irritable bowel syndrome)   . Chest pain     Past Surgical History  Procedure Date  . US echocardiography 1995  . Cardiac catheterization   . Abdominal hysterectomy   . Breast biopsy 1989    several  . Anterior fusion lumbar spine     L-S fusion with cage - [place] 07/03  . Esophagogastroduodenoscopy 05/06  . Lumbar laminectomy/decompression microdiscectomy     cyst removal 01/08  . Lumbar spine surgery     cyst removed/rod inserted 1/11    Dr Franky Macho  . Nm myoview ltd     Lexiscan myoview (10/2009): EF 77%, normal wall motion, normal perfusion.   Marland Kitchen Anterior cervical decomp/discectomy fusion 4/12    Dr Enzo Bi;;    Family History  Problem Relation Age of Onset  . Arthritis Mother   . Cervical cancer Mother   . Healthy Father   . Migraines Sister   . Cervical cancer Sister   . Migraines Brother   . Asthma Daughter   . Coronary artery disease Maternal Grandmother   . Hypertension  Maternal Grandmother   . Diabetes Maternal Grandmother   . Coronary artery disease Paternal Grandmother   . Hypertension Paternal Grandmother   . Diabetes Paternal Grandmother     History   Social History  . Marital Status: Married    Spouse Name: N/A    Number of Children: 3  . Years of Education: N/A   Occupational History  . disabled 2003    Social History Main Topics  . Smoking status: Current Everyday Smoker -- 1.0 packs/day for 38 years    Types: Cigarettes  . Smokeless tobacco: Not on file  . Alcohol Use: No  . Drug Use: Yes    Special: Marijuana  . Sexually Active: Not on file   Other Topics Concern  . Not on file   Social History Narrative   Daily caffeine use   Review of Systems Appetite isn't that good Wonders about her ulcers--tries to eat small amounts at a time Weight is back down over 10#    Objective:   Physical Exam  Constitutional: She appears well-developed and well-nourished. No distress.  Psychiatric: She has a normal mood and affect. Her  behavior is normal. Judgment and thought content normal.          Assessment & Plan:

## 2011-04-22 NOTE — Patient Instructions (Signed)
Please start the lyrica at 25mg  three times daily. If no problems, and pain is not much better after 2 weeks, increase to 50mg  (2 capsules)

## 2011-04-24 ENCOUNTER — Other Ambulatory Visit: Payer: Self-pay | Admitting: *Deleted

## 2011-04-24 MED ORDER — PAROXETINE HCL 20 MG PO TABS: 20.0000 mg | ORAL_TABLET | ORAL | Status: DC

## 2011-04-28 ENCOUNTER — Telehealth: Payer: Self-pay | Admitting: *Deleted

## 2011-04-28 NOTE — Telephone Encounter (Signed)
Prior Molly Peters is needed for lyrica, form is on your desk.

## 2011-04-28 NOTE — Telephone Encounter (Signed)
Please find out from her is she has been tried on gabapentin (neurontin) If yes, what happened with it?

## 2011-04-30 NOTE — Telephone Encounter (Signed)
Form faxed and given to Jacki Cones

## 2011-04-30 NOTE — Telephone Encounter (Signed)
Form done 

## 2011-04-30 NOTE — Telephone Encounter (Signed)
Spoke with patient and yes she has been on gabapentin for her feet and she stated it didn't help.

## 2011-05-01 NOTE — Telephone Encounter (Signed)
Prior auth denied for lyrica, letter is on your desk.

## 2011-05-01 NOTE — Telephone Encounter (Signed)
Spoke with patient and advised results, she would like to go back on the gabapentin.

## 2011-05-01 NOTE — Telephone Encounter (Signed)
Let her know I am sorry but I don't know what it takes to get it approved

## 2011-05-02 NOTE — Telephone Encounter (Signed)
Please have her start on gabapentin 300mg  at bedtime for 1 day, then 300mg  bid for 2 days then 300mg  tid. If a week goes by without side effects and ongoing pain, have her increase to 600mg  tid by adding an additional pill every 3-4 days She may need to take a higher dose at bedtime-- some people do better with regimens like 300/300/900  Rx 300mg    #180 x 1 1 tid and increase as directed If she goes up to 600 tid, we will change the Rx next month

## 2011-05-06 NOTE — Telephone Encounter (Signed)
.  left message to have patient return my call.  

## 2011-05-07 ENCOUNTER — Telehealth: Payer: Self-pay | Admitting: *Deleted

## 2011-05-07 NOTE — Telephone Encounter (Signed)
Opened in error

## 2011-05-11 ENCOUNTER — Encounter: Payer: Self-pay | Admitting: *Deleted

## 2011-05-11 ENCOUNTER — Other Ambulatory Visit: Payer: Self-pay | Admitting: *Deleted

## 2011-05-11 MED ORDER — GABAPENTIN 300 MG PO CAPS: 300.0000 mg | ORAL_CAPSULE | Freq: Three times a day (TID) | ORAL | Status: DC | PRN

## 2011-05-11 NOTE — Telephone Encounter (Signed)
Please have her start on gabapentin 300mg at bedtime for 1 day, then 300mg bid for 2 days then 300mg tid. If a week goes by without side effects and ongoing pain, have her increase to 600mg tid by adding an additional pill every 3-4 days She may need to take a higher dose at bedtime-- some people do better with regimens like 300/300/900  Rx 300mg   #180 x 1 1 tid and increase as directed If she goes up to 600 tid, we will change the Rx next month 

## 2011-05-11 NOTE — Telephone Encounter (Signed)
Tried calling patient again will send letter to home address, I have tried calling multiple times.

## 2011-05-11 NOTE — Telephone Encounter (Signed)
rx sent to [facility], mailed letter to patient's home address, unable to reach by phone left several messages.

## 2011-05-22 NOTE — Telephone Encounter (Signed)
Dee, has this been taken care of? 

## 2011-05-27 ENCOUNTER — Encounter: Payer: Self-pay | Admitting: Internal Medicine

## 2011-05-27 ENCOUNTER — Ambulatory Visit (INDEPENDENT_AMBULATORY_CARE_PROVIDER_SITE_OTHER): Payer: Medicare PPO | Admitting: Internal Medicine

## 2011-05-27 DIAGNOSIS — F411 Generalized anxiety disorder: Secondary | ICD-10-CM

## 2011-05-27 DIAGNOSIS — I10 Essential (primary) hypertension: Secondary | ICD-10-CM

## 2011-05-27 DIAGNOSIS — R0602 Shortness of breath: Secondary | ICD-10-CM

## 2011-05-27 DIAGNOSIS — IMO0001 Reserved for inherently not codable concepts without codable children: Secondary | ICD-10-CM

## 2011-05-27 MED ORDER — LAMOTRIGINE 100 MG PO TABS: 100.0000 mg | ORAL_TABLET | Freq: Two times a day (BID) | ORAL | Status: DC

## 2011-05-27 MED ORDER — LOSARTAN POTASSIUM-HCTZ 100-12.5 MG PO TABS: 1.0000 | ORAL_TABLET | Freq: Every day | ORAL | Status: DC

## 2011-05-27 NOTE — Progress Notes (Signed)
Subjective:    Patient ID: Molly Peters, female    DOB: February 25, 1961, 50 y.o.   MRN: [id number]  HPI lyrica was not approved by her insurance Not clear what the problem was  Now back on gabapentin 300tid Some help but pain persists Not really fatigued by daytime doses--does take afternoon nap but this is not new  Still gets percocet for pain from Dr Franky Macho Uses bid regularly  Had episode recently where she couldn't speak right Parents came over when they couldn't understand her on the phone Lasted several hours Did have headache then as well Did have workup with past events---carotids were fine  Now with sore throat for several days No fevers Some cough---lots of mucus Sinus congestion for quite some time This is fairly chronic  Current Outpatient Prescriptions on File Prior to Visit  Medication Sig Dispense Refill  . ALPRAZolam (XANAX) 1 MG tablet Take 1 tablet (1 mg total) by mouth 2 (two) times daily as needed.  180 tablet  0  . gabapentin (NEURONTIN) 300 MG capsule Take 1 capsule (300 mg total) by mouth 3 (three) times daily as needed.  180 capsule  1  . lamoTRIgine (LAMICTAL) 100 MG tablet Take 100 mg by mouth 2 (two) times daily.        Marland Kitchen losartan-hydrochlorothiazide (HYZAAR) 100-12.5 MG per tablet Take 1 tablet by mouth daily.        Marland Kitchen oxyCODONE-acetaminophen (PERCOCET) 7.5-325 MG per tablet Take 1 tablet by mouth 2 (two) times daily as needed.        Marland Kitchen PARoxetine (PAXIL) 20 MG tablet Take 1 tablet (20 mg total) by mouth every morning.  90 tablet  3  . simvastatin (ZOCOR) 20 MG tablet Take 20 mg by mouth at bedtime.          Allergies  Allergen Reactions  . Carbamazepine     REACTION: unspecified  . Clonazepam     REACTION: unspecified  . Diphenhydramine Hcl     REACTION: unspecified  . Divalproex Sodium     REACTION: unspecified  . Tiotropium Bromide Monohydrate     REACTION: cough and throat problems    Past Medical History  Diagnosis Date  . COPD  (chronic obstructive pulmonary disease)   . Depression   . GERD (gastroesophageal reflux disease)   . Anxiety   . Glaucoma   . Arthritis   . Hypertension   . Seizure disorder   . Valvular heart disease     Initial workup a number of years ago at [facility].  Echo (10/2009) showed EF 60-65%,      normal LV size, moderate aortic insufficiency with a trileaflet aortic valve and normal aortic root size.  Mild      mitral regurgitation.   . Hemorrhoids   . Fibromyalgia   . Anal fissure   . Chronic headaches   . IBS (irritable bowel syndrome)   . Chest pain     Past Surgical History  Procedure Date  . US echocardiography 1995  . Cardiac catheterization   . Abdominal hysterectomy   . Breast biopsy 1989    several  . Anterior fusion lumbar spine     L-S fusion with cage - [facility] 07/03  . Esophagogastroduodenoscopy 05/06  . Lumbar laminectomy/decompression microdiscectomy     cyst removal 01/08  . Lumbar spine surgery     cyst removed/rod inserted 1/11    Dr Franky Macho  . Nm myoview ltd     Lexiscan myoview (10/2009): EF  77%, normal wall motion, normal perfusion.   Marland Kitchen Anterior cervical decomp/discectomy fusion 4/12    Dr Enzo Bi;;    Family History  Problem Relation Age of Onset  . Arthritis Mother   . Cervical cancer Mother   . Healthy Father   . Migraines Sister   . Cervical cancer Sister   . Migraines Brother   . Asthma Daughter   . Coronary artery disease Maternal Grandmother   . Hypertension Maternal Grandmother   . Diabetes Maternal Grandmother   . Coronary artery disease Paternal Grandmother   . Hypertension Paternal Grandmother   . Diabetes Paternal Grandmother     History   Social History  . Marital Status: Married    Spouse Name: N/A    Number of Children: 3  . Years of Education: N/A   Occupational History  . disabled 2003    Social History Main Topics  . Smoking status: Current Everyday Smoker -- 1.0 packs/day for 38 years    Types: Cigarettes  . Smokeless  tobacco: Never Used  . Alcohol Use: No  . Drug Use: Yes    Special: Marijuana  . Sexually Active: Not on file   Other Topics Concern  . Not on file   Social History Narrative   Daily caffeine use   Review of Systems     Objective:   Physical Exam  Constitutional: She appears well-developed and well-nourished. No distress.  HENT:  Head: Normocephalic and atraumatic.       Mild pharyngeal injection No exudates   Neck: Normal range of motion. Neck supple.  Cardiovascular: Normal rate and regular rhythm.        Systolic murmur ?S4 or opening snap  Musculoskeletal: Normal range of motion. She exhibits no edema and no tenderness.  Lymphadenopathy:    She has no cervical adenopathy.  Psychiatric: She has a normal mood and affect. Her behavior is normal. Judgment and thought content normal.          Assessment & Plan:

## 2011-05-27 NOTE — Assessment & Plan Note (Signed)
Still needs the xanax regularly---occ needs tid but always bid

## 2011-05-27 NOTE — Assessment & Plan Note (Signed)
May be developing a chronic bronchitis Discussed cigarette cessation again--she has cut down

## 2011-05-27 NOTE — Assessment & Plan Note (Signed)
BP Readings from Last 3 Encounters:  05/27/11 102/70  04/22/11 120/70  01/02/11 135/90   Still with good control

## 2011-05-27 NOTE — Assessment & Plan Note (Signed)
Some better with the gabapentin Will titrate the dose up to 1800mg  daily

## 2011-05-27 NOTE — Patient Instructions (Signed)
Please increase the gabapentin to 4 daily --- 1 twice a day and 2 at bedtime. If aching continues, increase to 5 tabs a day in 2 weeks, and then 6 a day in a month. You can experiment with what works best for you in terms of when to take the extra tabs (with day doses or at bedtime)

## 2011-05-29 ENCOUNTER — Other Ambulatory Visit: Payer: Self-pay | Admitting: *Deleted

## 2011-05-29 MED ORDER — GABAPENTIN 300 MG PO CAPS: 300.0000 mg | ORAL_CAPSULE | Freq: Two times a day (BID) | ORAL | Status: DC

## 2011-05-29 NOTE — Telephone Encounter (Signed)
Faxed request from [facility] is on your desk.  They are asking for a new script for gabapentin.  This was recently refilled to [facility].

## 2011-05-29 NOTE — Telephone Encounter (Signed)
Dr.Letvak filled out form and I faxed back to [facility]

## 2011-06-09 ENCOUNTER — Other Ambulatory Visit: Payer: Self-pay | Admitting: Internal Medicine

## 2011-06-09 NOTE — Telephone Encounter (Signed)
Refill denied, refill was faxed 04/13/11 # 180, will fax again.

## 2011-06-16 ENCOUNTER — Other Ambulatory Visit: Payer: Self-pay | Admitting: *Deleted

## 2011-06-16 NOTE — Telephone Encounter (Signed)
Pt is requesting refill on xanax.  This was last faxed to [facility] for 180 tablets.  Pt states she sometimes takes 3 a day, and says you are aware of that.  She only has enough for about 2 weeks left, but is not due for a refill until 9/4.  She is asking that the directions be changed to 3 a day as needed to reflect how she takes it.  Please let her know.

## 2011-06-16 NOTE — Telephone Encounter (Signed)
Okay to change to tid and can give refill for #270 x 0 Remind her that using it three times a day should be only occ---I do not expect to fill Rx every 3 months if we give her #270

## 2011-06-16 NOTE — Telephone Encounter (Signed)
Spoke with patient and she will have [facility] send [place] another refill request with the increased dosage.

## 2011-06-17 ENCOUNTER — Other Ambulatory Visit: Payer: Self-pay | Admitting: Internal Medicine

## 2011-06-17 NOTE — Telephone Encounter (Signed)
We just dealt with this tomorrow

## 2011-06-18 NOTE — Telephone Encounter (Signed)
Okay I signed it

## 2011-06-18 NOTE — Telephone Encounter (Signed)
Yes I adivsed pt that we couldn't fax xanax to [facility] without a form and we can't call it in to [facility] either. Pt wanted me to mail her the rx and I told her we couldn't mail it to her either. Spoke with patient and advised she would have to come pick it up, she agreed. rx on your desk to be signed.

## 2011-06-26 ENCOUNTER — Emergency Department: Payer: Self-pay | Admitting: Emergency Medicine

## 2011-07-11 DEATH — deceased

## 2011-07-23 ENCOUNTER — Encounter: Payer: Self-pay | Admitting: Internal Medicine

## 2011-07-23 ENCOUNTER — Ambulatory Visit (INDEPENDENT_AMBULATORY_CARE_PROVIDER_SITE_OTHER): Payer: Medicare PPO | Admitting: Internal Medicine

## 2011-07-23 DIAGNOSIS — M25529 Pain in unspecified elbow: Secondary | ICD-10-CM

## 2011-07-23 DIAGNOSIS — F17200 Nicotine dependence, unspecified, uncomplicated: Secondary | ICD-10-CM

## 2011-07-23 DIAGNOSIS — M25521 Pain in right elbow: Secondary | ICD-10-CM | POA: Insufficient documentation

## 2011-07-23 NOTE — Assessment & Plan Note (Signed)
The timing is related to the fall but she doesn't remember injuring it  Could just be contusion Ongoing distal numbness and cannot extend elbow properly Discussed further eval Will refer to Dr Patsy Lager

## 2011-07-23 NOTE — Assessment & Plan Note (Signed)
She would love to stop but has not been successful Husband bought her electronic cigarette but she doesn't like Gave her info 1-800 quit-now counselled

## 2011-07-23 NOTE — Progress Notes (Signed)
Subjective:    Patient ID: Molly Peters, female    DOB: 18-May-1961, 50 y.o.   MRN: [id number]  HPI Having right elbow pain Can't straighten it or twist it without pain Pain radiates into forearm and hand will go numb (entire hand) Some weakness as well---drops things (may be due to numbness)  Did have fall off ladder about 3 weeks Got caught in crack between cabinet and refrigerator Bruised up left side, concussion Right side pain started about 1 week later----not sure if she could have hit that side also  Tried her regular meds Aleve no help either Heat and ice no help No brace tried  Current Outpatient Prescriptions on File Prior to Visit  Medication Sig Dispense Refill  . ALPRAZolam (XANAX) 1 MG tablet Take 1 tablet (1 mg total) by mouth 3 (three) times daily as needed.  270 tablet  0  . gabapentin (NEURONTIN) 300 MG capsule Take 1 capsule (300 mg total) by mouth 2 (two) times daily. And 2 at bedtime. Increase as directed  540 capsule  1  . lamoTRIgine (LAMICTAL) 100 MG tablet Take 1 tablet (100 mg total) by mouth 2 (two) times daily.  180 tablet  3  . losartan-hydrochlorothiazide (HYZAAR) 100-12.5 MG per tablet Take 1 tablet by mouth daily.  90 tablet  3  . oxyCODONE-acetaminophen (PERCOCET) 7.5-325 MG per tablet Take 1 tablet by mouth 2 (two) times daily as needed.        Marland Kitchen PARoxetine (PAXIL) 20 MG tablet Take 1 tablet (20 mg total) by mouth every morning.  90 tablet  3  . simvastatin (ZOCOR) 20 MG tablet Take 20 mg by mouth at bedtime.          Allergies  Allergen Reactions  . Carbamazepine     REACTION: unspecified  . Clonazepam     REACTION: unspecified  . Diphenhydramine Hcl     REACTION: unspecified  . Divalproex Sodium     REACTION: unspecified  . Tiotropium Bromide Monohydrate     REACTION: cough and throat problems    Past Medical History  Diagnosis Date  . COPD (chronic obstructive pulmonary disease)   . Depression   . GERD (gastroesophageal reflux  disease)   . Anxiety   . Glaucoma   . Arthritis   . Hypertension   . Seizure disorder   . Valvular heart disease     Initial workup a number of years ago at [facility]: [facility]  Echo (10/2009) showed EF 60-65%,      normal LV size, moderate aortic insufficiency with a trileaflet aortic valve and normal aortic root size.  Mild      mitral regurgitation.   . Hemorrhoids   . Fibromyalgia   . Anal fissure   . Chronic headaches   . IBS (irritable bowel syndrome)   . Chest pain     Past Surgical History  Procedure Date  . US echocardiography 1995  . Cardiac catheterization   . Abdominal hysterectomy   . Breast biopsy 1989    several  . Anterior fusion lumbar spine     L-S fusion with cage - [facility] 07/03  . Esophagogastroduodenoscopy 05/06  . Lumbar laminectomy/decompression microdiscectomy     cyst removal 01/08  . Lumbar spine surgery     cyst removed/rod inserted 1/11    Dr Franky Macho  . Nm myoview ltd     Lexiscan myoview (10/2009): EF 77%, normal wall motion, normal perfusion.   Marland Kitchen Anterior cervical decomp/discectomy fusion 4/12  Dr Enzo Bi;;    Family History  Problem Relation Age of Onset  . Arthritis Mother   . Cervical cancer Mother   . Healthy Father   . Migraines Sister   . Cervical cancer Sister   . Migraines Brother   . Asthma Daughter   . Coronary artery disease Maternal Grandmother   . Hypertension Maternal Grandmother   . Diabetes Maternal Grandmother   . Coronary artery disease Paternal Grandmother   . Hypertension Paternal Grandmother   . Diabetes Paternal Grandmother     History   Social History  . Marital Status: Married    Spouse Name: N/A    Number of Children: 3  . Years of Education: N/A   Occupational History  . disabled 2003    Social History Main Topics  . Smoking status: Current Everyday Smoker -- 1.0 packs/day for 38 years    Types: Cigarettes  . Smokeless tobacco: Never Used   Comment: down to 3 cigarettes per day. Uses nicotine inhaler--this  helped her in the past  . Alcohol Use: No  . Drug Use: Yes    Special: Marijuana  . Sexually Active: Not on file   Other Topics Concern  . Not on file   Social History Narrative   Daily caffeine use   Review of Systems Left side pain has resolved Concussion has cleared also     Objective:   Physical Exam  Constitutional: She appears well-developed and well-nourished. No distress.  Musculoskeletal:       Full passive ROM of right shoulder but this causes scapular pain  Right elbow cannot extend completely and pain when trying Pain with passive supination but almost complete No clear effusion Tenderness No bursa tenderness  Neurological:       Mild -moderate decrease in grip strength on right           Assessment & Plan:

## 2011-07-23 NOTE — Patient Instructions (Signed)
Please set up appointment for Dr Patsy Lager to evaluate your right elbow

## 2011-07-28 ENCOUNTER — Institutional Professional Consult (permissible substitution): Payer: Medicare PPO | Admitting: Family Medicine

## 2011-08-11 ENCOUNTER — Encounter: Payer: Self-pay | Admitting: Family Medicine

## 2011-08-11 ENCOUNTER — Ambulatory Visit (INDEPENDENT_AMBULATORY_CARE_PROVIDER_SITE_OTHER): Payer: Medicare PPO | Admitting: Family Medicine

## 2011-08-11 ENCOUNTER — Ambulatory Visit (INDEPENDENT_AMBULATORY_CARE_PROVIDER_SITE_OTHER)
Admission: RE | Admit: 2011-08-11 | Discharge: 2011-08-11 | Disposition: A | Discharge status: Home / Self Care | Payer: Medicare PPO | Source: Ambulatory Visit | Attending: Family Medicine | Admitting: Family Medicine

## 2011-08-11 VITALS — BP 112/70 | HR 88 | Temp 97.9°F | Ht 60.5 in | Wt 106.8 lb

## 2011-08-11 DIAGNOSIS — M25529 Pain in unspecified elbow: Secondary | ICD-10-CM

## 2011-08-11 DIAGNOSIS — R200 Anesthesia of skin: Secondary | ICD-10-CM

## 2011-08-11 DIAGNOSIS — M25629 Stiffness of unspecified elbow, not elsewhere classified: Secondary | ICD-10-CM

## 2011-08-11 DIAGNOSIS — M25521 Pain in right elbow: Secondary | ICD-10-CM

## 2011-08-11 DIAGNOSIS — R209 Unspecified disturbances of skin sensation: Secondary | ICD-10-CM

## 2011-08-11 NOTE — Patient Instructions (Signed)
REFERRAL: GO THE THE FRONT ROOM AT THE ENTRANCE OF OUR CLINIC, NEAR CHECK IN. ASK FOR Molly Peters. SHE WILL HELP YOU SET UP YOUR REFERRAL. DATE: TIME:  

## 2011-08-11 NOTE — Progress Notes (Signed)
  Subjective:    Patient ID: Molly Peters, female    DOB: 10/23/1961, 50 y.o.   MRN: [id number]  HPI  ATHLEEN FELTNER, a 50 y.o. female presents today in the office for the following:    The patient presents for evaluation of right-sided elbow pain has been ongoing for 3-4 weeks, after she fell from a ladder and struck her left side as well as struck her head and developed a concussion. At that time she went to the emergency room was evaluated. Since that time, she has had some significant difficulty with using her right elbow. She has significant difficulty with flexion, has limitation in her range of motion with flexion, extension as well as supination and pronation. There is no significant bruising at this point. She also is having some grip weakness as well as some tingling.  Larey Seat off a ladder, remembers falling off and mostly was on the left side, head hit the floor and had a concussion. Went to the ER.   The PMH, PSH, Social History, Family History, Medications, and allergies have been reviewed in [facility], and have been updated if relevant.   Review of Systems REVIEW OF SYSTEMS  GEN: No fevers, chills. Nontoxic. Primarily MSK c/o today. MSK: Detailed in the HPI GI: tolerating PO intake without difficulty Neuro: detailed above Otherwise the pertinent positives of the ROS are noted above.      Objective:   Physical Exam   Physical Exam  Blood pressure 112/70, pulse 88, temperature 97.9 F (36.6 C), temperature source Oral, height 5' 0.5" (1.537 m), weight 106 lb 12.8 oz (48.444 kg), SpO2 97.00%.  GEN: WDWN, NAD, Non-toxic, A & O x 3 HEENT: Atraumatic, Normocephalic. Neck supple. No masses, No LAD. Ears and Nose: No external deformity. EXTR: No c/c/e NEURO Normal gait.  PSYCH: Normally interactive. Conversant. Not depressed or anxious appearing.  Calm demeanor.   Bilateral shoulders: Full range of motion.  Right elbow. Painful with flexion and extension, patient lacks  full extension by approximately 5, terminal pain. Unable to achieve full flexion with a contralateral loss of at least 20. Significant decrease in supination pronation. Nontender at the CRP. Nontender to lateral epicondyles, medial epicondyle, or at the olecranon. Stable to valgus stress. Grip is diminished compared to the left.  Sensation appears grossly intact, though there is a overall diminished sensation by the patient, but not focally so on examination. Weaker with wrist extension and flexion compared to right 4/5.       Assessment & Plan:   1. Right elbow pain  DG Elbow Complete Right, MR Elbow Right Wo Contrast  2. Elbow stiffness  MR Elbow Right Wo Contrast  3. Right arm numbness  MR Elbow Right Wo Contrast    X-rays, right colon full series. There is no evidence of occult fracture. No fat pad is seen. All one view there is a small radiopaque body that is most consistent with likely a osteoarthritic fragment or old injury.  Clinical concern for prior trauma that could introduce intra-articular pathology, prior fracture not seen on x-rays, and clinical concern for potential intra-articular derangement that could impact patient's ability to use this arm. We'll obtain an MRI without contrast to evaluate the right elbow.

## 2011-08-13 ENCOUNTER — Ambulatory Visit
Admission: RE | Admit: 2011-08-13 | Discharge: 2011-08-13 | Disposition: A | Discharge status: Home / Self Care | Payer: Medicare PPO | Source: Ambulatory Visit | Attending: Family Medicine | Admitting: Family Medicine

## 2011-08-13 DIAGNOSIS — R200 Anesthesia of skin: Secondary | ICD-10-CM

## 2011-08-13 DIAGNOSIS — M25629 Stiffness of unspecified elbow, not elsewhere classified: Secondary | ICD-10-CM

## 2011-08-13 DIAGNOSIS — M25521 Pain in right elbow: Secondary | ICD-10-CM

## 2011-08-17 ENCOUNTER — Other Ambulatory Visit: Payer: Self-pay | Admitting: Family Medicine

## 2011-08-17 DIAGNOSIS — M25521 Pain in right elbow: Secondary | ICD-10-CM

## 2011-08-17 DIAGNOSIS — M19029 Primary osteoarthritis, unspecified elbow: Secondary | ICD-10-CM

## 2011-08-17 DIAGNOSIS — M25519 Pain in unspecified shoulder: Secondary | ICD-10-CM

## 2011-08-17 MED ORDER — PREDNISONE 20 MG PO TABS: ORAL_TABLET | ORAL | Status: DC

## 2011-08-17 NOTE — Progress Notes (Signed)
Please help with f/u with me in 1 month

## 2011-08-18 NOTE — Progress Notes (Signed)
Left message with husband for patient to call back.

## 2011-08-19 NOTE — Progress Notes (Signed)
Patient follow up appt scheduled

## 2011-09-16 ENCOUNTER — Institutional Professional Consult (permissible substitution): Payer: Medicare PPO | Admitting: Family Medicine

## 2011-09-16 DIAGNOSIS — Z0289 Encounter for other administrative examinations: Secondary | ICD-10-CM

## 2011-09-28 ENCOUNTER — Encounter: Payer: Self-pay | Admitting: Internal Medicine

## 2011-09-28 ENCOUNTER — Ambulatory Visit (INDEPENDENT_AMBULATORY_CARE_PROVIDER_SITE_OTHER): Payer: Medicare PPO | Admitting: Internal Medicine

## 2011-09-28 VITALS — BP 108/70 | HR 81 | Temp 98.6°F | Ht 60.0 in | Wt 109.0 lb

## 2011-09-28 DIAGNOSIS — IMO0001 Reserved for inherently not codable concepts without codable children: Secondary | ICD-10-CM

## 2011-09-28 DIAGNOSIS — I10 Essential (primary) hypertension: Secondary | ICD-10-CM

## 2011-09-28 DIAGNOSIS — Z23 Encounter for immunization: Secondary | ICD-10-CM

## 2011-09-28 DIAGNOSIS — F411 Generalized anxiety disorder: Secondary | ICD-10-CM

## 2011-09-28 DIAGNOSIS — J449 Chronic obstructive pulmonary disease, unspecified: Secondary | ICD-10-CM

## 2011-09-28 MED ORDER — ALPRAZOLAM 1 MG PO TABS: 1.0000 mg | ORAL_TABLET | Freq: Three times a day (TID) | ORAL | Status: DC | PRN

## 2011-09-28 NOTE — Assessment & Plan Note (Signed)
Seems like chronic bronchitis Gave info though doesn't think she will stop smoking

## 2011-09-28 NOTE — Progress Notes (Signed)
Subjective:    Patient ID: Molly Peters, female    DOB: 1961/10/30, 50 y.o.   MRN: [id number]  HPI Still hurting Dr Franky Macho increased percocet to 10mg  dose Got sick on new prescription for Opana----Molly Peters threw them out  Did "unlock" everything in her body---but couldn't tolerate  Still having trouble going to sleep Uses the gabapentin --takes 3 of the 300mg  tid  Nerves still an issue Uses tid regularly for nerves  No chest pain occ feels breathing off Constant cough with mucus Discussed smoking---Molly Peters is not ready to quit (has been unable to) Gave info on 1-800-QUIT-NOW  occ headaches Some edema in feet Medial knees will swell at times as well  Current Outpatient Prescriptions on File Prior to Visit  Medication Sig Dispense Refill  . ALPRAZolam (XANAX) 1 MG tablet Take 1 tablet (1 mg total) by mouth 3 (three) times daily as needed.  270 tablet  0  . gabapentin (NEURONTIN) 300 MG capsule Take 1 capsule (300 mg total) by mouth 2 (two) times daily. And 2 at bedtime. Increase as directed  540 capsule  1  . lamoTRIgine (LAMICTAL) 100 MG tablet Take 1 tablet (100 mg total) by mouth 2 (two) times daily.  180 tablet  3  . losartan-hydrochlorothiazide (HYZAAR) 100-12.5 MG per tablet Take 1 tablet by mouth daily.  90 tablet  3  . PARoxetine (PAXIL) 20 MG tablet Take 1 tablet (20 mg total) by mouth every morning.  90 tablet  3  . simvastatin (ZOCOR) 20 MG tablet Take 20 mg by mouth at bedtime.          Allergies  Allergen Reactions  . Carbamazepine     REACTION: unspecified  . Clonazepam     REACTION: unspecified  . Diphenhydramine Hcl     REACTION: unspecified  . Divalproex Sodium     REACTION: unspecified  . Tiotropium Bromide Monohydrate     REACTION: cough and throat problems    Past Medical History  Diagnosis Date  . COPD (chronic obstructive pulmonary disease)   . Depression   . GERD (gastroesophageal reflux disease)   . Anxiety   . Glaucoma   . Arthritis   .  Hypertension   . Seizure disorder   . Valvular heart disease     Initial workup a number of years ago at [facility].  Echo (10/2009) showed EF 60-65%,      normal LV size, moderate aortic insufficiency with a trileaflet aortic valve and normal aortic root size.  Mild      mitral regurgitation.   . Hemorrhoids   . Fibromyalgia   . Anal fissure   . Chronic headaches   . IBS (irritable bowel syndrome)   . Chest pain     Past Surgical History  Procedure Date  . US echocardiography 1995  . Cardiac catheterization   . Abdominal hysterectomy   . Breast biopsy 1989    several  . Anterior fusion lumbar spine     L-S fusion with cage - [facility] 07/03  . Esophagogastroduodenoscopy 05/06  . Lumbar laminectomy/decompression microdiscectomy     cyst removal 01/08  . Lumbar spine surgery     cyst removed/rod inserted 1/11    Dr Franky Macho  . Nm myoview ltd     Lexiscan myoview (10/2009): EF 77%, normal wall motion, normal perfusion.   Marland Kitchen Anterior cervical decomp/discectomy fusion 4/12    Dr Enzo Bi;;    Family History  Problem Relation Age of Onset  . Arthritis Mother   .  Cervical cancer Mother   . Healthy Father   . Migraines Sister   . Cervical cancer Sister   . Migraines Brother   . Asthma Daughter   . Coronary artery disease Maternal Grandmother   . Hypertension Maternal Grandmother   . Diabetes Maternal Grandmother   . Coronary artery disease Paternal Grandmother   . Hypertension Paternal Grandmother   . Diabetes Paternal Grandmother     History   Social History  . Marital Status: Married    Spouse Name: N/A    Number of Children: 3  . Years of Education: N/A   Occupational History  . disabled 2003    Social History Main Topics  . Smoking status: Current Everyday Smoker -- 1.0 packs/day for 38 years    Types: Cigarettes  . Smokeless tobacco: Never Used   Comment: down to 3 cigarettes per day. Uses nicotine inhaler--this helped her in the past  . Alcohol Use: No  . Drug Use:  Yes    Special: Marijuana  . Sexually Active: Not on file   Other Topics Concern  . Not on file   Social History Narrative   Daily caffeine use   Review of Systems Appetite is okay Weight stable     Objective:   Physical Exam  Constitutional: Molly Peters appears well-developed. No distress.  Neck: Normal range of motion. Neck supple.  Cardiovascular: Normal rate and regular rhythm.        S4 gallop vs opening snap ?slight systolic murmur  Pulmonary/Chest: Effort normal and breath sounds normal. No respiratory distress. Molly Peters has no wheezes. Molly Peters has no rales.  Abdominal: Soft. There is no rebound and no guarding.       Mild generalized tenderness  Musculoskeletal: Molly Peters exhibits no edema.  Lymphadenopathy:    Molly Peters has no cervical adenopathy.  Psychiatric: Her behavior is normal. Judgment and thought content normal.       Dysthymic  No sig anxiety now          Assessment & Plan:

## 2011-09-28 NOTE — Assessment & Plan Note (Signed)
Ongoing complex pain issues--both fibromyalgia and neurosurgical On narcotics per neurosurg On gabapentin from me

## 2011-09-28 NOTE — Assessment & Plan Note (Signed)
BP Readings from Last 3 Encounters:  09/28/11 108/70  08/11/11 112/70  07/23/11 100/60   Has been low Will cut meds in half

## 2011-09-28 NOTE — Assessment & Plan Note (Signed)
Ongoing need for xanax Still on SSRI as well

## 2011-10-16 ENCOUNTER — Other Ambulatory Visit: Payer: Medicare PPO | Admitting: *Deleted

## 2011-10-20 ENCOUNTER — Other Ambulatory Visit: Payer: Medicare PPO | Admitting: *Deleted

## 2011-11-05 ENCOUNTER — Other Ambulatory Visit: Payer: Self-pay | Admitting: *Deleted

## 2011-11-05 MED ORDER — PAROXETINE HCL 20 MG PO TABS: 20.0000 mg | ORAL_TABLET | ORAL | Status: DC

## 2011-11-12 ENCOUNTER — Ambulatory Visit: Payer: Medicare PPO | Admitting: Internal Medicine

## 2011-12-03 ENCOUNTER — Other Ambulatory Visit: Payer: Self-pay

## 2011-12-03 NOTE — Telephone Encounter (Signed)
Pt needs new rx sent to [facility] for Lamotrigine 100 gm.Pt request call back after refilled [phone]

## 2011-12-04 ENCOUNTER — Telehealth: Payer: Self-pay | Admitting: Internal Medicine

## 2011-12-04 MED ORDER — LAMOTRIGINE 100 MG PO TABS: 100.0000 mg | ORAL_TABLET | Freq: Two times a day (BID) | ORAL | Status: DC

## 2011-12-04 NOTE — Telephone Encounter (Signed)
Okay x 1 year 

## 2011-12-04 NOTE — Telephone Encounter (Signed)
rx sent to pharmacy by e-script  

## 2011-12-04 NOTE — Telephone Encounter (Signed)
Have already been requested and sent to [facility] for approval

## 2011-12-04 NOTE — Telephone Encounter (Signed)
Requesting Refills.  I am sorry she did not leave additional info.  Can you explain to her when you speak with the patient that our refills voice mail asks for the med name/dose and pharmacy and it would assist [place] with refilling quicker if she could leave that info in the future?  Thanks

## 2011-12-14 ENCOUNTER — Other Ambulatory Visit: Payer: Self-pay | Admitting: Neurosurgery

## 2011-12-14 ENCOUNTER — Other Ambulatory Visit (HOSPITAL_COMMUNITY): Payer: Self-pay | Admitting: Neurosurgery

## 2011-12-14 DIAGNOSIS — M545 Low back pain, unspecified: Secondary | ICD-10-CM

## 2011-12-14 DIAGNOSIS — M542 Cervicalgia: Secondary | ICD-10-CM

## 2011-12-17 ENCOUNTER — Ambulatory Visit (HOSPITAL_COMMUNITY)
Admission: RE | Admit: 2011-12-17 | Discharge: 2011-12-17 | Disposition: A | Discharge status: Home / Self Care | Payer: Medicare PPO | Source: Ambulatory Visit | Attending: Neurosurgery | Admitting: Neurosurgery

## 2011-12-17 DIAGNOSIS — M542 Cervicalgia: Secondary | ICD-10-CM

## 2011-12-17 DIAGNOSIS — M545 Low back pain, unspecified: Secondary | ICD-10-CM

## 2011-12-17 DIAGNOSIS — M549 Dorsalgia, unspecified: Secondary | ICD-10-CM | POA: Insufficient documentation

## 2011-12-17 DIAGNOSIS — Z981 Arthrodesis status: Secondary | ICD-10-CM | POA: Insufficient documentation

## 2011-12-17 MED ORDER — OXYCODONE-ACETAMINOPHEN 10-325 MG PO TABS: 1.0000 | ORAL_TABLET | ORAL | Status: DC | PRN

## 2011-12-17 MED ORDER — OXYCODONE-ACETAMINOPHEN 5-325 MG PO TABS
1.0000 | ORAL_TABLET | ORAL | Status: DC | PRN
  Administered 2011-12-17: 1 via ORAL
  Filled 2011-12-17: qty 1

## 2011-12-17 MED ORDER — IOHEXOL 300 MG/ML  SOLN
10.0000 mL | Freq: Once | INTRAMUSCULAR | Status: AC | PRN
  Administered 2011-12-17: 10 mL via INTRATHECAL

## 2011-12-17 MED ORDER — OXYCODONE-ACETAMINOPHEN 10-325 MG PO TABS: 1.0000 | ORAL_TABLET | ORAL | Status: DC

## 2011-12-17 MED ORDER — OXYCODONE-ACETAMINOPHEN 5-325 MG PO TABS
ORAL_TABLET | ORAL | Status: AC
  Administered 2011-12-17: 1 via ORAL
  Filled 2011-12-17: qty 1

## 2011-12-17 MED ORDER — DIAZEPAM 5 MG PO TABS
ORAL_TABLET | ORAL | Status: AC
  Filled 2011-12-17: qty 2

## 2011-12-17 MED ORDER — ONDANSETRON HCL 4 MG/2ML IJ SOLN: 4.0000 mg | Freq: Four times a day (QID) | INTRAMUSCULAR | Status: DC | PRN

## 2011-12-17 MED ORDER — DIAZEPAM 5 MG PO TABS
10.0000 mg | ORAL_TABLET | Freq: Once | ORAL | Status: AC
  Administered 2011-12-17: 10 mg via ORAL

## 2011-12-17 NOTE — H&P (Signed)
Molly Peters is an 51 y.o. female.   Chief Complaint: Low back and neck pain HPI: Long term patient s/p lumbar fusions, cervical arthrodesis with new complaints of neck and low back pain  Past Medical History  Diagnosis Date  . COPD (chronic obstructive pulmonary disease)   . Depression   . GERD (gastroesophageal reflux disease)   . Anxiety   . Glaucoma   . Arthritis   . Hypertension   . Seizure disorder   . Valvular heart disease     Initial workup a number of years ago at [facility].  Echo (10/2009) showed EF 60-65%,      normal LV size, moderate aortic insufficiency with a trileaflet aortic valve and normal aortic root size.  Mild      mitral regurgitation.   . Hemorrhoids     internal and external  . Fibromyalgia   . Anal fissure   . Chronic headaches   . IBS (irritable bowel syndrome)   . Chest pain     Past Surgical History  Procedure Date  . US echocardiography 1995  . Cardiac catheterization   . Abdominal hysterectomy   . Breast biopsy 1989    several  . Anterior fusion lumbar spine     L-S fusion with cage - [place] 07/03  . Esophagogastroduodenoscopy 09/28/2008    GERD, Gastritis, Candidiasis  . Lumbar laminectomy/decompression microdiscectomy     cyst removal 01/08  . Lumbar spine surgery     cyst removed/rod inserted 1/11    Dr Franky Macho  . Nm myoview ltd     Lexiscan myoview (10/2009): EF 77%, normal wall motion, normal perfusion.   Marland Kitchen Anterior cervical decomp/discectomy fusion 4/12    Dr Enzo Bi;;  . Colonoscopy 09/28/2008    internal and external hemorrhoids    Family History  Problem Relation Age of Onset  . Arthritis Mother   . Cervical cancer Mother   . Healthy Father   . Migraines Sister   . Cervical cancer Sister   . Migraines Brother   . Asthma Daughter   . Coronary artery disease Maternal Grandmother   . Hypertension Maternal Grandmother   . Diabetes Maternal Grandmother   . Coronary artery disease Paternal Grandmother   . Hypertension  Paternal Grandmother   . Diabetes Paternal Grandmother    Social History:  reports that she has been smoking Cigarettes.  She has a 38 pack-year smoking history. She has never used smokeless tobacco. She reports that she uses illicit drugs (Marijuana). She reports that she does not drink alcohol.  Allergies:  Allergies  Allergen Reactions  . Carbamazepine     REACTION: unspecified  . Clonazepam     REACTION: unspecified  . Diphenhydramine Hcl     REACTION: unspecified  . Divalproex Sodium     REACTION: unspecified  . Tiotropium Bromide Monohydrate     REACTION: cough and throat problems    Medications Prior to Admission  Medication Sig Dispense Refill  . losartan-hydrochlorothiazide (HYZAAR) 100-12.5 MG per tablet Take 0.5 tablets by mouth daily.       Marland Kitchen oxyCODONE-acetaminophen (PERCOCET) 10-325 MG per tablet Take 1 tablet by mouth 2 (two) times daily.       . simvastatin (ZOCOR) 20 MG tablet Take 20 mg by mouth at bedtime.         Medications Prior to Admission  Medication Dose Route Frequency Provider Last Rate Last Dose  . diazepam (VALIUM) 5 MG tablet           .  diazepam (VALIUM) tablet 10 mg  10 mg Oral Once Carmela Hurt, MD   10 mg at 12/17/11 9604    No results found for this or any previous visit (from the past 48 hour(s)). No results found.  ROS  Blood pressure 157/89, pulse 62, temperature 98 F (36.7 C), temperature source Oral, resp. rate 18, height 5' (1.524 m), weight 48.535 kg (107 lb), SpO2 94.00%. Physical Exam Alert, orientedx4. 5/5 strength lower and upper extremities. Normal muscle tone and bulk. Reflexes intact.  Assessment/Plan Myelogram for assessment  Molly Peters L 12/17/2011, 8:21 AM

## 2011-12-17 NOTE — Procedures (Signed)
*   No surgery found *  9:09 AM  PATIENT:  Molly Peters  51 y.o. female with cervical and low back pain. History of lumbar fusions and cervical arthrodesis      PROCEDURE: lumbar myelogram    SURGEON:  Damarco Keysor  ASSISTANTS:none  ANESTHESIA:   local   DICTATION: I performed a lumbar puncture at l4/5 without difficulty. I injected 10 cc 300 dye into the thecal sac. She tolerated the procedure well. Xray confirmed location of needle and dye.

## 2011-12-22 ENCOUNTER — Other Ambulatory Visit: Payer: Self-pay | Admitting: Internal Medicine

## 2011-12-22 NOTE — Telephone Encounter (Signed)
I've looked in centricity and EPIC for a cream and pt need to be seen, correct?

## 2011-12-22 NOTE — Telephone Encounter (Signed)
Pt called for a cream that has been precribed for her in the past for itchey red bumps. Did not see on her med list..cdavis

## 2011-12-22 NOTE — Telephone Encounter (Signed)
The cream is elemite 5% (permethrin) Apply from neck down and wash off in morning All household members should be treated---dispense # 3 bottles Wash all towels, linens, etc the morning after Rx Sometimes needs to be repeated in a week---it takes a while for the itching to calm down

## 2011-12-22 NOTE — Telephone Encounter (Signed)
Patient called back stating that her granddaughter just saw her dermatologist and she was diagnosed with scabies and she is having the same symptoms that her granddaughter has. Patient would like something called to the pharmacy for this. Patient states that she has 4 people living in her house.  Pharmacy -[facility]

## 2011-12-23 MED ORDER — PERMETHRIN 5 % EX CREA: 1.0000 "application " | TOPICAL_CREAM | Freq: Once | CUTANEOUS | Status: DC

## 2011-12-23 NOTE — Telephone Encounter (Signed)
Addended by: Sueanne Margarita on: 12/23/2011 10:19 AM   Modules accepted: Orders

## 2011-12-23 NOTE — Telephone Encounter (Signed)
rx sent to pharmacy by e-script .left message to have patient return my call.  

## 2012-01-05 ENCOUNTER — Other Ambulatory Visit: Payer: Self-pay | Admitting: *Deleted

## 2012-01-05 MED ORDER — PAROXETINE HCL 20 MG PO TABS: 20.0000 mg | ORAL_TABLET | Freq: Every morning | ORAL | Status: DC

## 2012-01-29 ENCOUNTER — Encounter: Payer: Self-pay | Admitting: Internal Medicine

## 2012-01-29 ENCOUNTER — Ambulatory Visit (INDEPENDENT_AMBULATORY_CARE_PROVIDER_SITE_OTHER): Payer: Medicare PPO | Admitting: Internal Medicine

## 2012-01-29 VITALS — BP 120/80 | HR 70 | Temp 98.3°F | Wt 106.0 lb

## 2012-01-29 DIAGNOSIS — IMO0002 Reserved for concepts with insufficient information to code with codable children: Secondary | ICD-10-CM

## 2012-01-29 DIAGNOSIS — I10 Essential (primary) hypertension: Secondary | ICD-10-CM

## 2012-01-29 DIAGNOSIS — IMO0001 Reserved for inherently not codable concepts without codable children: Secondary | ICD-10-CM

## 2012-01-29 DIAGNOSIS — F411 Generalized anxiety disorder: Secondary | ICD-10-CM

## 2012-01-29 DIAGNOSIS — J449 Chronic obstructive pulmonary disease, unspecified: Secondary | ICD-10-CM

## 2012-01-29 MED ORDER — ALPRAZOLAM 1 MG PO TABS: 1.0000 mg | ORAL_TABLET | Freq: Three times a day (TID) | ORAL | Status: DC | PRN

## 2012-01-29 NOTE — Assessment & Plan Note (Signed)
Gets some relief from the gabapentin

## 2012-01-29 NOTE — Assessment & Plan Note (Signed)
Using the xanax on a regular basis

## 2012-01-29 NOTE — Assessment & Plan Note (Signed)
Still sees Dr Mikal Plane On percocet

## 2012-01-29 NOTE — Assessment & Plan Note (Signed)
Ongoing problems Really needs to stop the smoking---she may be closer to being ready

## 2012-01-29 NOTE — Progress Notes (Signed)
Subjective:    Patient ID: Molly Peters, female    DOB: [birth year], 51 y.o.   MRN: [id number]  HPI Doing okay  Keeping up with Dr Franky Macho Still on percocet Had myelogram done and had neck evaluated (not fully fused despite the surgery) Doesn't want to do more surgery till she stops smoking  Did cut back on BP med No problems with this Does get chest pain at times---like a restriction in her breathing Can hear noise when breathing at night---wheeze and whistling Chronic productive cough  Sleeps is fair Still using the gabapentin at bedtime (3)  Anxiety persists Uses xanax 2-3 times per day Father is very sick---may be dying. Refuses to see doctor  Current Outpatient Prescriptions on File Prior to Visit  Medication Sig Dispense Refill  . ALPRAZolam (XANAX) 1 MG tablet Take 1 mg by mouth 3 (three) times daily.      Marland Kitchen gabapentin (NEURONTIN) 300 MG capsule Take 900 mg by mouth at bedtime.      . lamoTRIgine (LAMICTAL) 100 MG tablet Take 100 mg by mouth 2 (two) times daily.      Marland Kitchen losartan-hydrochlorothiazide (HYZAAR) 100-12.5 MG per tablet Take 0.5 tablets by mouth daily.       Marland Kitchen oxyCODONE-acetaminophen (PERCOCET) 10-325 MG per tablet Take 1 tablet by mouth 2 (two) times daily.       Marland Kitchen PARoxetine (PAXIL) 20 MG tablet Take 1 tablet (20 mg total) by mouth every morning.  90 tablet  2  . simvastatin (ZOCOR) 20 MG tablet Take 20 mg by mouth at bedtime.          Allergies  Allergen Reactions  . Carbamazepine     REACTION: unspecified  . Clonazepam     REACTION: unspecified  . Diphenhydramine Hcl     REACTION: unspecified  . Divalproex Sodium     REACTION: unspecified  . Tiotropium Bromide Monohydrate     REACTION: cough and throat problems    Past Medical History  Diagnosis Date  . COPD (chronic obstructive pulmonary disease)   . Depression   . GERD (gastroesophageal reflux disease)   . Anxiety   . Glaucoma   . Arthritis   . Hypertension   . Seizure disorder     . Valvular heart disease     Initial workup a number of years ago at [facility].  Echo (10/2009) showed EF 60-65%,      normal LV size, moderate aortic insufficiency with a trileaflet aortic valve and normal aortic root size.  Mild      mitral regurgitation.   . Hemorrhoids     internal and external  . Fibromyalgia   . Anal fissure   . Chronic headaches   . IBS (irritable bowel syndrome)   . Chest pain     Past Surgical History  Procedure Date  . US echocardiography 1995  . Cardiac catheterization   . Abdominal hysterectomy   . Breast biopsy 1989    several  . Anterior fusion lumbar spine     L-S fusion with cage - [place] 07/03  . Esophagogastroduodenoscopy 09/28/2008    GERD, Gastritis, Candidiasis  . Lumbar laminectomy/decompression microdiscectomy     cyst removal 01/08  . Lumbar spine surgery     cyst removed/rod inserted 1/11    Dr Franky Macho  . Nm myoview ltd     Lexiscan myoview (10/2009): EF 77%, normal wall motion, normal perfusion.   Marland Kitchen Anterior cervical decomp/discectomy fusion 4/12    Dr Enzo Bi;;  .  Colonoscopy 09/28/2008    internal and external hemorrhoids    Family History  Problem Relation Age of Onset  . Arthritis Mother   . Cervical cancer Mother   . Healthy Father   . Migraines Sister   . Cervical cancer Sister   . Migraines Brother   . Asthma Daughter   . Coronary artery disease Maternal Grandmother   . Hypertension Maternal Grandmother   . Diabetes Maternal Grandmother   . Coronary artery disease Paternal Grandmother   . Hypertension Paternal Grandmother   . Diabetes Paternal Grandmother     History   Social History  . Marital Status: Married    Spouse Name: N/A    Number of Children: 3  . Years of Education: N/A   Occupational History  . disabled 2003    Social History Main Topics  . Smoking status: Current Everyday Smoker -- 1.0 packs/day for 38 years    Types: Cigarettes  . Smokeless tobacco: Never Used   Comment: down to 3 cigarettes  per day. Uses nicotine inhaler--this helped her in the past  . Alcohol Use: No  . Drug Use: Yes    Special: Marijuana  . Sexually Active: Not on file   Other Topics Concern  . Not on file   Social History Narrative   Daily caffeine use   Review of Systems New lower denture--doesn't fit right    Objective:   Physical Exam  Constitutional: She appears well-developed and well-nourished. No distress.  Neck: Normal range of motion. Neck supple.  Cardiovascular: Normal rate and regular rhythm.  Exam reveals no gallop.   Murmur heard.      Systolic and diastolic murmurs---loudest in aortic area  Pulmonary/Chest: Effort normal and breath sounds normal. No respiratory distress. She has no wheezes. She has no rales.  Musculoskeletal: She exhibits no edema.  Lymphadenopathy:    She has no cervical adenopathy.  Psychiatric: She has a normal mood and affect. Her behavior is normal.          Assessment & Plan:

## 2012-01-29 NOTE — Assessment & Plan Note (Signed)
BP Readings from Last 3 Encounters:  01/29/12 120/80  12/17/11 166/95  09/28/11 108/70   BP still fine despite lower dose Due for labs No changes

## 2012-02-03 ENCOUNTER — Encounter: Payer: Self-pay | Admitting: Cardiovascular Disease

## 2012-02-03 ENCOUNTER — Ambulatory Visit (INDEPENDENT_AMBULATORY_CARE_PROVIDER_SITE_OTHER): Payer: Medicare PPO | Admitting: Cardiovascular Disease

## 2012-02-03 DIAGNOSIS — R002 Palpitations: Secondary | ICD-10-CM

## 2012-02-03 DIAGNOSIS — R0602 Shortness of breath: Secondary | ICD-10-CM

## 2012-02-03 DIAGNOSIS — F17200 Nicotine dependence, unspecified, uncomplicated: Secondary | ICD-10-CM

## 2012-02-03 DIAGNOSIS — I38 Endocarditis, valve unspecified: Secondary | ICD-10-CM

## 2012-02-03 DIAGNOSIS — R079 Chest pain, unspecified: Secondary | ICD-10-CM

## 2012-02-03 DIAGNOSIS — K259 Gastric ulcer, unspecified as acute or chronic, without hemorrhage or perforation: Secondary | ICD-10-CM

## 2012-02-03 DIAGNOSIS — I10 Essential (primary) hypertension: Secondary | ICD-10-CM

## 2012-02-03 DIAGNOSIS — E785 Hyperlipidemia, unspecified: Secondary | ICD-10-CM

## 2012-02-03 DIAGNOSIS — J449 Chronic obstructive pulmonary disease, unspecified: Secondary | ICD-10-CM

## 2012-02-03 MED ORDER — FLUTICASONE-SALMETEROL 100-50 MCG/DOSE IN AEPB: 1.0000 | INHALATION_SPRAY | Freq: Two times a day (BID) | RESPIRATORY_TRACT | Status: DC

## 2012-02-03 MED ORDER — BUDESONIDE-FORMOTEROL FUMARATE 160-4.5 MCG/ACT IN AERO: 2.0000 | INHALATION_SPRAY | Freq: Two times a day (BID) | RESPIRATORY_TRACT | Status: DC

## 2012-02-03 MED ORDER — ALBUTEROL SULFATE HFA 108 (90 BASE) MCG/ACT IN AERS: 2.0000 | INHALATION_SPRAY | Freq: Four times a day (QID) | RESPIRATORY_TRACT | Status: DC | PRN

## 2012-02-03 NOTE — Patient Instructions (Addendum)
You are doing well. Please start omeprazole one a day for stomach  Please call us if you have new issues that need to be addressed before your next appt.  Your physician wants you to follow-up in: 12 months.  You will receive a reminder letter in the mail two months in advance. If you don't receive a letter, please call our office to schedule the follow-up appointment.

## 2012-02-03 NOTE — Assessment & Plan Note (Signed)
We have suggested she restart omeprazole daily

## 2012-02-03 NOTE — Assessment & Plan Note (Signed)
She has requested a refill of her inhalers and until she has a chance to followup with Dr. Alphonsus Sias. We have renewed albuterol, Symbicort, Advair.

## 2012-02-03 NOTE — Assessment & Plan Note (Signed)
Mild aortic valve insufficiency, mild MR and TR. Likely not contributing to her symptoms

## 2012-02-03 NOTE — Assessment & Plan Note (Signed)
Cholesterol is at goal on the current lipid regimen. No changes to the medications were made.  

## 2012-02-03 NOTE — Assessment & Plan Note (Signed)
Blood pressure is well controlled on today's visit. No changes made to the medications. 

## 2012-02-03 NOTE — Assessment & Plan Note (Signed)
We have encouraged her to continue to work on weaning her cigarettes and smoking cessation. She will continue to work on this and does not want any assistance with chantix.  

## 2012-02-03 NOTE — Progress Notes (Signed)
Patient ID: Molly Peters, female    DOB: [birth year], 51 y.o.   MRN: [id number]  HPI Comments: 51 yo with mild  aortic insufficiency and COPD, long history of smoking who continues to smoke one pack per day , who presents for routine followup . Previous episodes of chest pain with treadmill Myoview showing no ischemia in late 2011.  Chronic back pain and sciatica.  She has had several back surgeries. She reports she needs additional cervical spine surgery but this is on hold until she is able to stop smoking. She has had some chest tightness and has not been using her inhalers. She is requesting a refill of her inhalers until she can see Dr. Alphonsus Sias. She was previously on albuterol, Advair, Symbicort. These are not on her medication list currently.   She reports having a history of ulcers in the past. She has been having some GI upset and symptoms concerning for recurrence. She feels she needs to make an appointment with her gastroenterologist for further evaluation. She does not take a proton pump inhibitor.     EKG shows normal sinus rhythm with rate 73 beats per minute with no significant ST or T wave changes, rare APC    Outpatient Encounter Prescriptions as of 02/03/2012  Medication Sig Dispense Refill  . albuterol (PROVENTIL HFA;VENTOLIN HFA) 108 (90 BASE) MCG/ACT inhaler Inhale 2 puffs into the lungs every 6 (six) hours as needed.  1 Inhaler  1  . ALPRAZolam (XANAX) 1 MG tablet Take 1 tablet (1 mg total) by mouth 3 (three) times daily as needed for sleep.  270 tablet  0  . budesonide-formoterol (SYMBICORT) 160-4.5 MCG/ACT inhaler Inhale 2 puffs into the lungs 2 (two) times daily.  1 Inhaler  1  . diclofenac (VOLTAREN) 50 MG EC tablet Take 50 mg by mouth 3 (three) times daily.      . Fluticasone-Salmeterol (ADVAIR DISKUS) 100-50 MCG/DOSE AEPB Inhale 1 puff into the lungs every 12 (twelve) hours.  14 each  1  . gabapentin (NEURONTIN) 300 MG capsule Take 900 mg by mouth at bedtime.      .  lamoTRIgine (LAMICTAL) 100 MG tablet Take 100 mg by mouth 2 (two) times daily.      Marland Kitchen losartan-hydrochlorothiazide (HYZAAR) 100-12.5 MG per tablet Take 0.5 tablets by mouth daily.       Marland Kitchen oxyCODONE-acetaminophen (PERCOCET) 10-325 MG per tablet Take 1 tablet by mouth 2 (two) times daily.       Marland Kitchen PARoxetine (PAXIL) 20 MG tablet Take 1 tablet (20 mg total) by mouth every morning.  90 tablet  2  . simvastatin (ZOCOR) 20 MG tablet Take 20 mg by mouth at bedtime.          Review of Systems  Constitutional: Negative.   HENT: Negative.   Eyes: Negative.   Respiratory: Positive for cough, chest tightness and shortness of breath.   Cardiovascular: Negative.   Gastrointestinal: Positive for abdominal pain.  Musculoskeletal: Positive for back pain.  Skin: Negative.   Neurological: Negative.   Hematological: Negative.   Psychiatric/Behavioral: Negative.   All other systems reviewed and are negative.    BP 120/80  Pulse 73  Ht 5\' 5"  (1.651 m)  Wt 109 lb (49.442 kg)  BMI 18.14 kg/m2   Physical Exam  Nursing note and vitals reviewed. Constitutional: She is oriented to person, place, and time. She appears well-developed and well-nourished.  HENT:  Head: Normocephalic.  Nose: Nose normal.  Mouth/Throat: Oropharynx is clear and  moist.  Eyes: Conjunctivae are normal. Pupils are equal, round, and reactive to light.  Neck: Normal range of motion. Neck supple. No JVD present.  Cardiovascular: Normal rate, regular rhythm, S1 normal, S2 normal, normal heart sounds and intact distal pulses.  Exam reveals no gallop and no friction rub.   No murmur heard. Pulmonary/Chest: Effort normal and breath sounds normal. No respiratory distress. She has no wheezes. She has no rales. She exhibits no tenderness.  Abdominal: Soft. Bowel sounds are normal. She exhibits no distension. There is no tenderness.  Musculoskeletal: Normal range of motion. She exhibits no edema and no tenderness.  Lymphadenopathy:    She  has no cervical adenopathy.  Neurological: She is alert and oriented to person, place, and time. Coordination normal.  Skin: Skin is warm and dry. No rash noted. No erythema.  Psychiatric: She has a normal mood and affect. Her behavior is normal. Judgment and thought content normal.         Assessment and Plan

## 2012-03-16 ENCOUNTER — Other Ambulatory Visit: Payer: Self-pay | Admitting: Internal Medicine

## 2012-05-11 ENCOUNTER — Other Ambulatory Visit: Payer: Self-pay

## 2012-05-11 NOTE — Telephone Encounter (Signed)
Pt left v/m need refill on Xanax;did not leave pharmacy or if wanted written rx. I left message for pt to call back. Dee do you know which pharmacy pt would want med to go to?

## 2012-05-11 NOTE — Telephone Encounter (Signed)
Okay #[id number] x [phone] 

## 2012-05-13 MED ORDER — ALPRAZOLAM 1 MG PO TABS: 1.0000 mg | ORAL_TABLET | Freq: Three times a day (TID) | ORAL | Status: DC | PRN

## 2012-05-13 NOTE — Telephone Encounter (Signed)
Spoke with patient and she would like this phoned to [facility] rx called into pharmacy

## 2012-07-25 ENCOUNTER — Encounter: Payer: Self-pay | Admitting: Family Medicine

## 2012-07-25 ENCOUNTER — Ambulatory Visit (INDEPENDENT_AMBULATORY_CARE_PROVIDER_SITE_OTHER): Payer: Medicare PPO | Admitting: Family Medicine

## 2012-07-25 VITALS — BP 120/76 | HR 64 | Temp 98.2°F | Wt 104.2 lb

## 2012-07-25 DIAGNOSIS — M75100 Unspecified rotator cuff tear or rupture of unspecified shoulder, not specified as traumatic: Secondary | ICD-10-CM

## 2012-07-25 DIAGNOSIS — M7550 Bursitis of unspecified shoulder: Secondary | ICD-10-CM

## 2012-07-25 DIAGNOSIS — M259 Joint disorder, unspecified: Secondary | ICD-10-CM

## 2012-07-25 DIAGNOSIS — IMO0002 Reserved for concepts with insufficient information to code with codable children: Secondary | ICD-10-CM

## 2012-07-25 DIAGNOSIS — M19019 Primary osteoarthritis, unspecified shoulder: Secondary | ICD-10-CM

## 2012-07-25 DIAGNOSIS — M7500 Adhesive capsulitis of unspecified shoulder: Secondary | ICD-10-CM

## 2012-07-25 NOTE — Progress Notes (Signed)
Nature conservation officer at [facility] [address] [place] [address] Phone: [phone] Fax: [phone]  Date:  07/25/2012   Name:  Molly Peters   DOB:  1961-06-10   MRN:  [id number] Gender: female Age: 51 y.o.  PCP:  Tillman Abide, MD    Chief Complaint: Shoulder Pain   History of Present Illness:  Molly Peters is a 51 y.o. pleasant patient who presents with the following:  6-7 months. L shoulder. No known injury or fall.  Larey Seat o a ladder. At the time, could move shoulder.   She reports an injury about 7 months ago when she fell off of a ladder. She is not clear that this definitely was a time when her shoulder for started to hurt.  Today, her main complaint is diffuse pain throughout the shoulder down into to the arm in a T-shirt distribution as well as some pain in the shoulder blade but without neck pain.  pain great deal at night and she has is significant restriction of motion in all planes of movement all of this as regard to the LEFT shoulder.  She denies neck pain over above her baseline, no radiculopathy.  Patient Active Problem List  Diagnosis  . HYPERLIPIDEMIA-MIXED  . ANXIETY  . TOBACCO ABUSE  . DEPRESSION  . GLAUCOMA  . HYPERTENSION  . VALVULAR HEART DISEASE  . HEMORRHOIDS  . COPD  . GERD  . GASTRIC ULCER  . IRRITABLE BOWEL SYNDROME  . BACK PAIN, LUMBAR, WITH RADICULOPATHY  . FIBROMYALGIA  . SEIZURE DISORDER  . PERSONAL HISTORY OF FAILED MODERATE SEDATION  . RESTLESS LEG SYNDROME  . CAROTID BRUIT, LEFT  . Right elbow pain    Past Medical History  Diagnosis Date  . COPD (chronic obstructive pulmonary disease)   . Depression   . GERD (gastroesophageal reflux disease)   . Anxiety   . Glaucoma   . Arthritis   . Hypertension   . Seizure disorder   . Valvular heart disease     Initial workup a number of years ago at [facility].  Echo (10/2009) showed EF 60-65%,      normal LV size, moderate aortic insufficiency with a trileaflet  aortic valve and normal aortic root size.  Mild      mitral regurgitation.   . Hemorrhoids     internal and external  . Fibromyalgia   . Anal fissure   . Chronic headaches   . IBS (irritable bowel syndrome)   . Chest pain     Past Surgical History  Procedure Date  . US echocardiography 1995  . Cardiac catheterization   . Abdominal hysterectomy   . Breast biopsy 1989    several  . Anterior fusion lumbar spine     L-S fusion with cage - [facility] 07/03  . Esophagogastroduodenoscopy 09/28/2008    GERD, Gastritis, Candidiasis  . Lumbar laminectomy/decompression microdiscectomy     cyst removal 01/08  . Lumbar spine surgery     cyst removed/rod inserted 1/11    Dr Franky Macho  . Nm myoview ltd     Lexiscan myoview (10/2009): EF 77%, normal wall motion, normal perfusion.   Marland Kitchen Anterior cervical decomp/discectomy fusion 4/12    Dr Enzo Bi;;  . Colonoscopy 09/28/2008    internal and external hemorrhoids    History  Substance Use Topics  . Smoking status: Current Every Day Smoker -- 1.0 packs/day for 38 years    Types: Cigarettes  . Smokeless tobacco: Never Used  Comment: down to 3 cigarettes per day. Uses nicotine inhaler--this helped her in the past  . Alcohol Use: No    Family History  Problem Relation Age of Onset  . Arthritis Mother   . Cervical cancer Mother   . Healthy Father   . Migraines Sister   . Cervical cancer Sister   . Migraines Brother   . Asthma Daughter   . Coronary artery disease Maternal Grandmother   . Hypertension Maternal Grandmother   . Diabetes Maternal Grandmother   . Coronary artery disease Paternal Grandmother   . Hypertension Paternal Grandmother   . Diabetes Paternal Grandmother     Allergies  Allergen Reactions  . Carbamazepine     REACTION: unspecified  . Clonazepam     REACTION: unspecified  . Diphenhydramine Hcl     REACTION: unspecified  . Divalproex Sodium     REACTION: unspecified  . Tiotropium Bromide Monohydrate     REACTION:  cough and throat problems    Medication list has been reviewed and updated.  Outpatient Prescriptions Prior to Visit  Medication Sig Dispense Refill  . albuterol (PROVENTIL HFA;VENTOLIN HFA) 108 (90 BASE) MCG/ACT inhaler Inhale 2 puffs into the lungs every 6 (six) hours as needed.  1 Inhaler  1  . ALPRAZolam (XANAX) 1 MG tablet Take 1 tablet (1 mg total) by mouth 3 (three) times daily as needed for sleep.  270 tablet  0  . budesonide-formoterol (SYMBICORT) 160-4.5 MCG/ACT inhaler Inhale 2 puffs into the lungs 2 (two) times daily.  1 Inhaler  1  . diclofenac (VOLTAREN) 50 MG EC tablet Take 50 mg by mouth 3 (three) times daily.      . Fluticasone-Salmeterol (ADVAIR DISKUS) 100-50 MCG/DOSE AEPB Inhale 1 puff into the lungs every 12 (twelve) hours.  14 each  1  . gabapentin (NEURONTIN) 300 MG capsule TAKE 1 CAPSULE TWICE DAILY AND 2 CAPSULES AT BEDTIME . INCREASE AS DIRECTED  360 capsule  PRN  . lamoTRIgine (LAMICTAL) 100 MG tablet Take 100 mg by mouth 2 (two) times daily.      Marland Kitchen losartan-hydrochlorothiazide (HYZAAR) 100-12.5 MG per tablet Take 0.5 tablets by mouth daily.       Marland Kitchen omeprazole (PRILOSEC) 20 MG capsule Take 1 capsule (20 mg total) by mouth daily.      Marland Kitchen oxyCODONE-acetaminophen (PERCOCET) 10-325 MG per tablet Take 1 tablet by mouth 2 (two) times daily.       Marland Kitchen PARoxetine (PAXIL) 20 MG tablet Take 1 tablet (20 mg total) by mouth every morning.  90 tablet  2  . simvastatin (ZOCOR) 20 MG tablet TAKE 1 TABLET DAILY AT BEDTIME  90 tablet  PRN    Review of Systems:   GEN: No fevers, chills. Nontoxic. Primarily MSK c/o today. MSK: Detailed in the HPI GI: tolerating PO intake without difficulty Neuro: No numbness, parasthesias, or tingling associated. Otherwise the pertinent positives of the ROS are noted above.    Physical Examination: Filed Vitals:   07/25/12 1113  BP: 120/76  Pulse: 64  Temp: 98.2 F (36.8 C)   Filed Vitals:   07/25/12 1113  Weight: 104 lb 4 oz (47.287 kg)     There is no height on file to calculate BMI. Ideal Body Weight:     GEN: WDWN, NAD, Non-toxic, Alert & Oriented x 3 HEENT: Atraumatic, Normocephalic.  Ears and Nose: No external deformity. EXTR: No clubbing/cyanosis/edema NEURO: Normal gait.  PSYCH: Normally interactive. Conversant. Not depressed or anxious appearing.  Calm demeanor.  Let shoulder abduction to 115. Flexion to 130. A loss 75 of external rotation, and minimally able to internally rotate the shoulder when the humerus is abducted to 90.  Chest of a mildly boggy subacromial bursa. Tender to palpation at the acromioclavicular joint. Speech and Yergason's tests are intact. Crossover test is markedly positive.  All rotator cuff impingement testing is equivocal given loss of motion.  Strength testing is approaching 5/5 in all directions.  Neurovascularly intact.  Assessment and Plan:  1. Frozen shoulder   2. AC joint arthropathy   3. Subacromial bursitis    Likely secondary frozen shoulder from lack of motion due to prior fall. Clear a.c. Joint pathology clinically.  Post lidocaine injection, the patient's range of motion did improve but is still impeded in multiple directions. Financially, the patient is unable to do a formal physical therapy, some reviewed [facility] frozen shoulder protocol with her.  Recheck 6 weeks  SubAC Injection, LEFT Verbal consent was obtained from the patient. Risks (including infection), benefits, and alternatives were explained. Patient prepped with Chloraprep and Ethyl Chloride used for anesthesia. The subacromial space was injected using the posterior approach. The patient tolerated the procedure well and had decreased pain post injection. No complications. Injection: 4 cc of Lidocaine 1% and 1cc of Depo-Medrol 40 mg. Needle: 22 gauge  Intrarticular Shoulder Injection, LEFT Verbal consent was obtained from the patient. Risks including infection explained and contrasted with  benefits and alternatives. Patient prepped with Chloraprep and Ethyl Chloride used for anesthesia. An intraarticular shoulder injection was performed using the posterior approach. The patient tolerated the procedure well and had decreased pain post injection. No complications. Injection: 4 cc of Lidocaine 1% and 1cc of Depo-Medrol 40 mg. Needle: 22 gauge   Hannah Beat, MD,

## 2012-07-25 NOTE — Patient Instructions (Addendum)
F/u 6 weeks 

## 2012-08-12 ENCOUNTER — Encounter: Payer: Medicare PPO | Admitting: Internal Medicine

## 2012-08-29 ENCOUNTER — Other Ambulatory Visit: Payer: Self-pay | Admitting: *Deleted

## 2012-08-29 MED ORDER — ALPRAZOLAM 1 MG PO TABS: 1.0000 mg | ORAL_TABLET | Freq: Three times a day (TID) | ORAL | Status: DC | PRN

## 2012-08-29 NOTE — Telephone Encounter (Signed)
Okay #[id number] x [phone] 

## 2012-08-29 NOTE — Telephone Encounter (Signed)
rx called into pharmacy

## 2012-09-05 ENCOUNTER — Ambulatory Visit: Payer: Medicare PPO | Admitting: Family Medicine

## 2012-09-19 ENCOUNTER — Ambulatory Visit: Payer: Medicare PPO | Admitting: Family Medicine

## 2012-10-12 ENCOUNTER — Other Ambulatory Visit: Payer: Self-pay | Admitting: Internal Medicine

## 2012-11-07 ENCOUNTER — Other Ambulatory Visit: Payer: Self-pay | Admitting: Internal Medicine

## 2012-11-08 NOTE — Telephone Encounter (Signed)
Okay to refill x 6 months  She should reschedule the PE she canceled

## 2012-11-08 NOTE — Telephone Encounter (Signed)
rx sent to pharmacy by e-script  

## 2012-12-14 ENCOUNTER — Other Ambulatory Visit: Payer: Self-pay | Admitting: Internal Medicine

## 2012-12-20 ENCOUNTER — Other Ambulatory Visit: Payer: Self-pay | Admitting: Internal Medicine

## 2012-12-20 NOTE — Telephone Encounter (Signed)
rx sent to pharmacy by e-script  

## 2012-12-20 NOTE — Telephone Encounter (Signed)
Okay to fill for a year 

## 2012-12-30 ENCOUNTER — Other Ambulatory Visit: Payer: Self-pay | Admitting: *Deleted

## 2012-12-30 MED ORDER — ALPRAZOLAM 1 MG PO TABS: 1.0000 mg | ORAL_TABLET | Freq: Three times a day (TID) | ORAL | Status: DC | PRN

## 2012-12-30 NOTE — Telephone Encounter (Signed)
Last refilled 08/29/2012

## 2012-12-30 NOTE — Telephone Encounter (Signed)
Okay #[id number] x [phone] 

## 2012-12-30 NOTE — Telephone Encounter (Signed)
rx called into pharmacy

## 2013-02-16 ENCOUNTER — Other Ambulatory Visit: Payer: Self-pay | Admitting: Internal Medicine

## 2013-04-04 ENCOUNTER — Encounter: Payer: Self-pay | Admitting: Radiology

## 2013-04-05 ENCOUNTER — Encounter: Payer: Self-pay | Admitting: Internal Medicine

## 2013-04-05 ENCOUNTER — Ambulatory Visit (INDEPENDENT_AMBULATORY_CARE_PROVIDER_SITE_OTHER): Payer: Medicare PPO | Admitting: Internal Medicine

## 2013-04-05 VITALS — BP 120/80 | HR 80 | Temp 98.0°F | Wt 108.0 lb

## 2013-04-05 DIAGNOSIS — R413 Other amnesia: Secondary | ICD-10-CM | POA: Insufficient documentation

## 2013-04-05 DIAGNOSIS — J449 Chronic obstructive pulmonary disease, unspecified: Secondary | ICD-10-CM

## 2013-04-05 DIAGNOSIS — I10 Essential (primary) hypertension: Secondary | ICD-10-CM

## 2013-04-05 DIAGNOSIS — E785 Hyperlipidemia, unspecified: Secondary | ICD-10-CM

## 2013-04-05 DIAGNOSIS — F411 Generalized anxiety disorder: Secondary | ICD-10-CM

## 2013-04-05 DIAGNOSIS — K219 Gastro-esophageal reflux disease without esophagitis: Secondary | ICD-10-CM

## 2013-04-05 DIAGNOSIS — Z0000 Encounter for general adult medical examination without abnormal findings: Secondary | ICD-10-CM | POA: Insufficient documentation

## 2013-04-05 LAB — BASIC METABOLIC PANEL
BUN: 5 mg/dL — ABNORMAL LOW (ref 6–23)
CO2: 31 mEq/L (ref 19–32)
Chloride: 97 mEq/L (ref 96–112)
Creatinine, Ser: 1 mg/dL (ref 0.4–1.2)
Glucose, Bld: 78 mg/dL (ref 70–99)
Potassium: 2.9 mEq/L — ABNORMAL LOW (ref 3.5–5.1)

## 2013-04-05 LAB — CBC WITH DIFFERENTIAL/PLATELET
Basophils Absolute: 0.1 10*3/uL (ref 0.0–0.1)
Eosinophils Absolute: 0.1 10*3/uL (ref 0.0–0.7)
Eosinophils Relative: 1.5 % (ref 0.0–5.0)
HCT: 41.8 % (ref 36.0–46.0)
Lymphs Abs: 3.7 10*3/uL (ref 0.7–4.0)
MCV: 94.7 fl (ref 78.0–100.0)
Monocytes Absolute: 0.7 10*3/uL (ref 0.1–1.0)
Neutrophils Relative %: 50.4 % (ref 43.0–77.0)
Platelets: 341 10*3/uL (ref 150.0–400.0)
RDW: 12.4 % (ref 11.5–14.6)
WBC: 9.3 10*3/uL (ref 4.5–10.5)

## 2013-04-05 LAB — HEPATIC FUNCTION PANEL
ALT: 15 U/L (ref 0–35)
AST: 23 U/L (ref 0–37)
Albumin: 4.4 g/dL (ref 3.5–5.2)
Alkaline Phosphatase: 89 U/L (ref 39–117)
Total Protein: 7.9 g/dL (ref 6.0–8.3)

## 2013-04-05 LAB — LIPID PANEL
Cholesterol: 143 mg/dL (ref 0–200)
LDL Cholesterol: 68 mg/dL (ref 0–99)
Triglycerides: 176 mg/dL — ABNORMAL HIGH (ref 0.0–149.0)

## 2013-04-05 MED ORDER — SIMVASTATIN 20 MG PO TABS: 20.0000 mg | ORAL_TABLET | Freq: Every day | ORAL | Status: DC

## 2013-04-05 MED ORDER — OMEPRAZOLE 20 MG PO CPDR: 20.0000 mg | DELAYED_RELEASE_CAPSULE | Freq: Every day | ORAL | Status: DC

## 2013-04-05 MED ORDER — ALPRAZOLAM 1 MG PO TABS: 1.0000 mg | ORAL_TABLET | Freq: Three times a day (TID) | ORAL | Status: DC | PRN

## 2013-04-05 NOTE — Assessment & Plan Note (Signed)
BP Readings from Last 3 Encounters:  04/05/13 120/80  07/25/12 120/76  02/03/12 120/80   Good control Due for labs

## 2013-04-05 NOTE — Assessment & Plan Note (Signed)
Marked symptoms now--probably from aleve Will start the omeprazole

## 2013-04-05 NOTE — Assessment & Plan Note (Signed)
Will set up mammogram UTD on colon

## 2013-04-05 NOTE — Assessment & Plan Note (Signed)
Mostly controlled on the xanax

## 2013-04-05 NOTE — Assessment & Plan Note (Signed)
Has been off the statin Will check levels but refill and restart

## 2013-04-05 NOTE — Assessment & Plan Note (Signed)
After visit, she mentions concerns about memory loss Will set up eval in 1 month to do MoCA Also occ breast discharge--may need brain MRI

## 2013-04-05 NOTE — Assessment & Plan Note (Signed)
Again urged her to stop smoking

## 2013-04-05 NOTE — Progress Notes (Signed)
Subjective:    Patient ID: Molly Peters, female    DOB: [birth year], 52 y.o.   MRN: [id number]  HPI Here for physical  Restarted symbicort and advair Discussed that you use one or the other---will stop the advair Doesn't notice too much improvement Does get some relief from the albuterol Again discussed cigarette cessation  Gets occasional palpitation or chest pain Has kept up with Dr Mariah Milling for follow up Notices if active outside  Ongoing anxiety Also notes depression-- grieving dad who died in 2023-11-18 Discussed controlled substance agreement--she does admit to some marijuana use  Current Outpatient Prescriptions on File Prior to Visit  Medication Sig Dispense Refill  . albuterol (PROVENTIL HFA;VENTOLIN HFA) 108 (90 BASE) MCG/ACT inhaler Inhale 2 puffs into the lungs every 6 (six) hours as needed.  1 Inhaler  1  . ALPRAZolam (XANAX) 1 MG tablet Take 1 tablet (1 mg total) by mouth 3 (three) times daily as needed for sleep.  270 tablet  0  . budesonide-formoterol (SYMBICORT) 160-4.5 MCG/ACT inhaler Inhale 2 puffs into the lungs 2 (two) times daily.  1 Inhaler  1  . lamoTRIgine (LAMICTAL) 100 MG tablet TAKE 1 TABLET (100 MG TOTAL) BY MOUTH 2 (TWO) TIMES DAILY.  180 tablet  1  . losartan-hydrochlorothiazide (HYZAAR) 100-12.5 MG per tablet TAKE 1 TABLET DAILY  90 tablet  0  . PARoxetine (PAXIL) 20 MG tablet TAKE 1 TABLET EVERY MORNING  90 tablet  3  . simvastatin (ZOCOR) 20 MG tablet TAKE 1 TABLET DAILY AT BEDTIME  90 tablet  PRN   No current facility-administered medications on file prior to visit.    Allergies  Allergen Reactions  . Carbamazepine     REACTION: unspecified  . Clonazepam     REACTION: unspecified  . Diphenhydramine Hcl     REACTION: unspecified  . Divalproex Sodium     REACTION: unspecified  . Tiotropium Bromide Monohydrate     REACTION: cough and throat problems    Past Medical History  Diagnosis Date  . COPD (chronic obstructive pulmonary  disease)   . Depression   . GERD (gastroesophageal reflux disease)   . Anxiety   . Glaucoma   . Arthritis   . Hypertension   . Seizure disorder   . Valvular heart disease     Initial workup a number of years ago at [facility].  Echo (Nov 17, 2009) showed EF 60-65%,      normal LV size, moderate aortic insufficiency with a trileaflet aortic valve and normal aortic root size.  Mild      mitral regurgitation.   . Hemorrhoids     internal and external  . Fibromyalgia   . Anal fissure   . Chronic headaches   . IBS (irritable bowel syndrome)   . Chest pain     Past Surgical History  Procedure Laterality Date  . US echocardiography  1995  . Cardiac catheterization    . Abdominal hysterectomy    . Breast biopsy  1989    several  . Anterior fusion lumbar spine      L-S fusion with cage - [place] 07/03  . Esophagogastroduodenoscopy  09/28/2008    GERD, Gastritis, Candidiasis  . Lumbar laminectomy/decompression microdiscectomy      cyst removal 01/08  . Lumbar spine surgery      cyst removed/rod inserted 1/11    Dr Franky Macho  . Nm myoview ltd      Lexiscan myoview (11/17/2009): EF 77%, normal wall motion, normal perfusion.   Marland Kitchen  Anterior cervical decomp/discectomy fusion  4/12    Dr Enzo Bi;;  . Colonoscopy  09/28/2008    internal and external hemorrhoids    Family History  Problem Relation Age of Onset  . Arthritis Mother   . Cervical cancer Mother   . Healthy Father   . Cancer Father     colon cancer  . Migraines Sister   . Cervical cancer Sister   . Migraines Brother   . Asthma Daughter   . Coronary artery disease Maternal Grandmother   . Hypertension Maternal Grandmother   . Diabetes Maternal Grandmother   . Coronary artery disease Paternal Grandmother   . Hypertension Paternal Grandmother   . Diabetes Paternal Grandmother     History   Social History  . Marital Status: Married    Spouse Name: N/A    Number of Children: 3  . Years of Education: N/A   Occupational History  .  Disabled 2003    Social History Main Topics  . Smoking status: Current Every Day Smoker -- 1.00 packs/day for 38 years    Types: Cigarettes  . Smokeless tobacco: Never Used     Comment: down to 3 cigarettes per day. Uses nicotine inhaler--this helped her in the past  . Alcohol Use: No  . Drug Use: Yes    Special: Marijuana  . Sexually Active: Not on file   Other Topics Concern  . Not on file   Social History Narrative       Review of Systems  Constitutional: Negative for fatigue and unexpected weight change.       Appetite still not that great Wears seat belt  HENT: Positive for congestion and rhinorrhea. Negative for dental problem.        Lower dentures--keeps up with dentist  Eyes: Negative for visual disturbance.       No diplopia or unilateral vision loss  Respiratory: Positive for cough, chest tightness and shortness of breath.   Cardiovascular: Positive for chest pain and palpitations. Negative for leg swelling.  Gastrointestinal: Positive for nausea, abdominal pain, diarrhea and blood in stool. Negative for vomiting.       Tends to have watery stool Occ blood if she has hard stool Occ trouble swallowing pills  Endocrine: Negative for cold intolerance and heat intolerance.  Genitourinary: Positive for dyspareunia. Negative for dysuria and difficulty urinating.       Cysts in breasts No incontinence No libido and some pain---not having sex now  Musculoskeletal: Positive for back pain, joint swelling and arthralgias.       Swelling and pain in knees---trouble bending Off percocet---using aleve now  Skin: Negative for rash.       Few scattered dark moles--no recent changes  Allergic/Immunologic: Positive for environmental allergies. Negative for immunocompromised state.       Mild pollen symptoms---doesn't use meds  Neurological: Positive for dizziness, weakness, numbness and headaches. Negative for syncope and light-headedness.       Right hand pain,  numbness ?weakness  Hematological: Positive for adenopathy. Bruises/bleeds easily.       Intermittent in neck  Psychiatric/Behavioral: Positive for dysphoric mood. Negative for sleep disturbance. The patient is nervous/anxious.        Grieving father's death       Objective:   Physical Exam  Constitutional: She is oriented to person, place, and time. She appears well-developed and well-nourished. No distress.  HENT:  Head: Normocephalic and atraumatic.  Right Ear: External ear normal.  Left Ear: External ear normal.  Mouth/Throat: Oropharynx is clear and moist. No oropharyngeal exudate.  Eyes: Conjunctivae and EOM are normal. Pupils are equal, round, and reactive to light.  Neck: Normal range of motion. Neck supple. No thyromegaly present.  Cardiovascular: Normal rate, regular rhythm and intact distal pulses.  Exam reveals no gallop.   Murmur heard. Systolic and diastolic murmurs at base  Pulmonary/Chest: Effort normal and breath sounds normal. No respiratory distress. She has no wheezes. She has no rales.  Abdominal: Soft. There is tenderness.  Mild generalized tenderness--?more epigastric  Musculoskeletal: She exhibits no edema.  Lymphadenopathy:    She has no cervical adenopathy.    She has no axillary adenopathy.  Neurological: She is alert and oriented to person, place, and time.  Skin: No rash noted. No erythema.  Psychiatric: Her behavior is normal.  Mild depressed mood Tearful thinking about dad          Assessment & Plan:

## 2013-04-05 NOTE — Patient Instructions (Signed)
Please call [facility] to set up your mammogram

## 2013-04-17 ENCOUNTER — Encounter: Payer: Self-pay | Admitting: Internal Medicine

## 2013-04-17 ENCOUNTER — Telehealth: Payer: Self-pay | Admitting: *Deleted

## 2013-04-17 MED ORDER — POTASSIUM CHLORIDE CRYS ER 20 MEQ PO TBCR: 20.0000 meq | EXTENDED_RELEASE_TABLET | Freq: Two times a day (BID) | ORAL | Status: DC

## 2013-04-17 NOTE — Telephone Encounter (Signed)
rx sent to pharmacy by e-script  

## 2013-04-24 ENCOUNTER — Other Ambulatory Visit (INDEPENDENT_AMBULATORY_CARE_PROVIDER_SITE_OTHER): Payer: Medicare PPO

## 2013-04-24 DIAGNOSIS — E876 Hypokalemia: Secondary | ICD-10-CM

## 2013-05-09 ENCOUNTER — Ambulatory Visit: Payer: Medicare PPO | Admitting: Internal Medicine

## 2013-05-09 DIAGNOSIS — Z0289 Encounter for other administrative examinations: Secondary | ICD-10-CM

## 2013-06-05 ENCOUNTER — Other Ambulatory Visit: Payer: Self-pay | Admitting: Internal Medicine

## 2013-06-06 ENCOUNTER — Other Ambulatory Visit: Payer: Self-pay | Admitting: Internal Medicine

## 2013-07-05 ENCOUNTER — Telehealth: Payer: Self-pay

## 2013-07-05 NOTE — Telephone Encounter (Signed)
Pt left v/m requesting referral; pt's left breast has started leaking again; pt request call [facility] to see what is needed. Spoke with Selena Batten at [place] and she said pt needs diagnostic mammogram and [place] of Lt breast.Please advise.

## 2013-07-05 NOTE — Telephone Encounter (Signed)
Actually, if she is having breast drainage, she needs appt first for exam. I can do that or refer to a breast surgeon if she prefers.  Then the appropriate radiology tests can be ordered

## 2013-07-06 ENCOUNTER — Encounter: Payer: Self-pay | Admitting: Internal Medicine

## 2013-07-06 ENCOUNTER — Ambulatory Visit (INDEPENDENT_AMBULATORY_CARE_PROVIDER_SITE_OTHER): Payer: Medicare PPO | Admitting: Internal Medicine

## 2013-07-06 VITALS — BP 132/80 | HR 69 | Temp 98.2°F | Wt 104.0 lb

## 2013-07-06 DIAGNOSIS — N6452 Nipple discharge: Secondary | ICD-10-CM

## 2013-07-06 DIAGNOSIS — N6459 Other signs and symptoms in breast: Secondary | ICD-10-CM

## 2013-07-06 NOTE — Assessment & Plan Note (Signed)
Doesn't look bad but does have drainage Not milky and unilateral so won't check prolactin Will do diagnostic mammo and ultrasound on right if needed

## 2013-07-06 NOTE — Progress Notes (Signed)
Subjective:    Patient ID: Molly Peters, female    DOB: November 14, 1960, 52 y.o.   MRN: [id number]  HPI Has noticed some nipple leakage Wets her bra and also in night clothes Greenish discharge  Only the right breast Goes back ?2 months but worsening No real tenderness (unless the dog jumps on her)  Current Outpatient Prescriptions on File Prior to Visit  Medication Sig Dispense Refill  . albuterol (PROVENTIL HFA;VENTOLIN HFA) 108 (90 BASE) MCG/ACT inhaler Inhale 2 puffs into the lungs every 6 (six) hours as needed.  1 Inhaler  1  . ALPRAZolam (XANAX) 1 MG tablet Take 1 tablet (1 mg total) by mouth 3 (three) times daily as needed for anxiety.  270 tablet  0  . budesonide-formoterol (SYMBICORT) 160-4.5 MCG/ACT inhaler Inhale 2 puffs into the lungs 2 (two) times daily.  1 Inhaler  1  . gabapentin (NEURONTIN) 300 MG capsule TAKE 1 CAPSULE TWICE DAILY AND 2 CAPSULES AT BEDTIME . INCREASE AS DIRECTED  360 capsule  0  . lamoTRIgine (LAMICTAL) 100 MG tablet TAKE 1 TABLET TWICE DAILY  180 tablet  1  . losartan-hydrochlorothiazide (HYZAAR) 100-12.5 MG per tablet TAKE 1 TABLET DAILY  90 tablet  0  . omeprazole (PRILOSEC) 20 MG capsule Take 1 capsule (20 mg total) by mouth daily.  90 capsule  3  . PARoxetine (PAXIL) 20 MG tablet TAKE 1 TABLET EVERY MORNING  90 tablet  3  . potassium chloride SA (K-DUR,KLOR-CON) 20 MEQ tablet Take 1 tablet (20 mEq total) by mouth 2 (two) times daily.  60 tablet  11  . simvastatin (ZOCOR) 20 MG tablet Take 1 tablet (20 mg total) by mouth at bedtime.  90 tablet  3   No current facility-administered medications on file prior to visit.    Allergies  Allergen Reactions  . Carbamazepine     REACTION: unspecified  . Clonazepam     REACTION: unspecified  . Diphenhydramine Hcl     REACTION: unspecified  . Divalproex Sodium     REACTION: unspecified  . Tiotropium Bromide Monohydrate     REACTION: cough and throat problems    Past Medical History  Diagnosis  Date  . COPD (chronic obstructive pulmonary disease)   . Depression   . GERD (gastroesophageal reflux disease)   . Anxiety   . Glaucoma   . Arthritis   . Hypertension   . Seizure disorder   . Valvular heart disease     Initial workup a number of years ago at [facility].  Echo (10/2009) showed EF 60-65%,      normal LV size, moderate aortic insufficiency with a trileaflet aortic valve and normal aortic root size.  Mild      mitral regurgitation.   . Hemorrhoids     internal and external  . Fibromyalgia   . Anal fissure   . Chronic headaches   . IBS (irritable bowel syndrome)   . Chest pain     Past Surgical History  Procedure Laterality Date  . US echocardiography  1995  . Cardiac catheterization    . Abdominal hysterectomy    . Breast biopsy  1989    several  . Anterior fusion lumbar spine      L-S fusion with cage - [place] 07/03  . Esophagogastroduodenoscopy  09/28/2008    GERD, Gastritis, Candidiasis  . Lumbar laminectomy/decompression microdiscectomy      cyst removal 01/08  . Lumbar spine surgery      cyst removed/rod inserted  1/11    Dr Franky Macho  . Nm myoview ltd      Lexiscan myoview (10/2009): EF 77%, normal wall motion, normal perfusion.   Marland Kitchen Anterior cervical decomp/discectomy fusion  4/12    Dr Enzo Bi;;  . Colonoscopy  09/28/2008    internal and external hemorrhoids    Family History  Problem Relation Age of Onset  . Arthritis Mother   . Cervical cancer Mother   . Healthy Father   . Cancer Father     colon cancer  . Migraines Sister   . Cervical cancer Sister   . Migraines Brother   . Asthma Daughter   . Coronary artery disease Maternal Grandmother   . Hypertension Maternal Grandmother   . Diabetes Maternal Grandmother   . Coronary artery disease Paternal Grandmother   . Hypertension Paternal Grandmother   . Diabetes Paternal Grandmother     History   Social History  . Marital Status: Married    Spouse Name: N/A    Number of Children: 3  . Years of  Education: N/A   Occupational History  . Disabled 2003    Social History Main Topics  . Smoking status: Current Every Day Smoker -- 1.00 packs/day for 38 years    Types: Cigarettes  . Smokeless tobacco: Never Used     Comment: down to 3 cigarettes per day. Uses nicotine inhaler--this helped her in the past  . Alcohol Use: No  . Drug Use: Yes    Special: Marijuana  . Sexual Activity: Not on file   Other Topics Concern  . Not on file   Social History Narrative       Review of Systems No bleeding from breast Low grade fever a couple of weeks ago---better now Some sinus congestion and drainage    Objective:   Physical Exam  Constitutional: She appears well-developed and well-nourished. No distress.  Genitourinary:  Bilateral moderate cystic changes in breasts with no worrisome masses Slight clear discharge from right nipple  Lymphadenopathy:    She has no axillary adenopathy.          Assessment & Plan:

## 2013-07-06 NOTE — Telephone Encounter (Signed)
Pt scheduled appt today @ 4:30

## 2013-07-18 ENCOUNTER — Other Ambulatory Visit: Payer: Self-pay | Admitting: *Deleted

## 2013-07-19 MED ORDER — ALPRAZOLAM 1 MG PO TABS: 1.0000 mg | ORAL_TABLET | Freq: Three times a day (TID) | ORAL | Status: DC | PRN

## 2013-07-19 NOTE — Telephone Encounter (Signed)
Okay #[id number] x [phone] 

## 2013-07-19 NOTE — Telephone Encounter (Signed)
rx called into pharmacy

## 2013-07-21 ENCOUNTER — Encounter: Payer: Self-pay | Admitting: Internal Medicine

## 2013-07-21 ENCOUNTER — Ambulatory Visit: Payer: Self-pay | Admitting: Internal Medicine

## 2013-07-25 ENCOUNTER — Encounter: Payer: Self-pay | Admitting: *Deleted

## 2013-10-04 ENCOUNTER — Other Ambulatory Visit: Payer: Self-pay | Admitting: Internal Medicine

## 2013-11-14 ENCOUNTER — Other Ambulatory Visit: Payer: Self-pay

## 2013-11-14 MED ORDER — ALPRAZOLAM 1 MG PO TABS: 1.0000 mg | ORAL_TABLET | Freq: Three times a day (TID) | ORAL | Status: DC | PRN

## 2013-11-14 NOTE — Telephone Encounter (Signed)
rx called into pharmacy

## 2013-11-14 NOTE — Telephone Encounter (Signed)
Okay #270 x 0

## 2013-11-14 NOTE — Telephone Encounter (Signed)
Received request for #270 last filled 07/18/13--please advise

## 2013-12-18 ENCOUNTER — Other Ambulatory Visit: Payer: Self-pay | Admitting: Internal Medicine

## 2014-02-13 ENCOUNTER — Other Ambulatory Visit: Payer: Self-pay | Admitting: Internal Medicine

## 2014-02-28 ENCOUNTER — Other Ambulatory Visit: Payer: Self-pay | Admitting: *Deleted

## 2014-02-28 NOTE — Telephone Encounter (Signed)
11/14/2013 

## 2014-03-01 MED ORDER — ALPRAZOLAM 1 MG PO TABS: 1.0000 mg | ORAL_TABLET | Freq: Three times a day (TID) | ORAL | Status: DC | PRN

## 2014-03-01 NOTE — Telephone Encounter (Signed)
Only okay #90 x 0 Needs appt before I can fill any more

## 2014-03-01 NOTE — Telephone Encounter (Signed)
rx called into pharmacy Left message with pharmacy that pt needs appt for further refills

## 2014-03-07 ENCOUNTER — Telehealth: Payer: Self-pay | Admitting: Internal Medicine

## 2014-03-07 ENCOUNTER — Ambulatory Visit
Admission: RE | Admit: 2014-03-07 | Discharge: 2014-03-07 | Disposition: A | Discharge status: Home / Self Care | Payer: Medicare PPO | Source: Ambulatory Visit | Attending: Family Medicine | Admitting: Family Medicine

## 2014-03-07 ENCOUNTER — Ambulatory Visit (INDEPENDENT_AMBULATORY_CARE_PROVIDER_SITE_OTHER): Payer: Medicare PPO | Admitting: Family Medicine

## 2014-03-07 ENCOUNTER — Encounter: Payer: Self-pay | Admitting: Family Medicine

## 2014-03-07 ENCOUNTER — Ambulatory Visit (INDEPENDENT_AMBULATORY_CARE_PROVIDER_SITE_OTHER)
Admission: RE | Admit: 2014-03-07 | Discharge: 2014-03-07 | Disposition: A | Discharge status: Home / Self Care | Payer: Medicare PPO | Source: Ambulatory Visit | Attending: Family Medicine | Admitting: Family Medicine

## 2014-03-07 VITALS — BP 106/74 | HR 85 | Temp 97.9°F | Ht 60.25 in | Wt 99.8 lb

## 2014-03-07 DIAGNOSIS — M25511 Pain in right shoulder: Secondary | ICD-10-CM

## 2014-03-07 DIAGNOSIS — M7500 Adhesive capsulitis of unspecified shoulder: Secondary | ICD-10-CM

## 2014-03-07 DIAGNOSIS — M19019 Primary osteoarthritis, unspecified shoulder: Secondary | ICD-10-CM

## 2014-03-07 DIAGNOSIS — M25519 Pain in unspecified shoulder: Secondary | ICD-10-CM

## 2014-03-07 DIAGNOSIS — M25512 Pain in left shoulder: Principal | ICD-10-CM

## 2014-03-07 MED ORDER — TIZANIDINE HCL 4 MG PO TABS: 4.0000 mg | ORAL_TABLET | Freq: Every evening | ORAL | Status: DC

## 2014-03-07 NOTE — Patient Instructions (Signed)
REFERRALS TO SPECIALISTS, SPECIAL TESTS (MRI, CT, ULTRASOUNDS)  GO THE WAITING ROOM AND TELL CHECK IN YOU NEED HELP WITH A REFERRAL. Either MARION or LINDA will help you set it up.  If it is between 1-2 PM they may be at lunch.  After 5 PM, they will likely be at home.  They will call you, so please make sure the office has your correct phone number.  Referrals sometimes can be done same day if urgent, but others can take 2 or 3 days to get an appointment. Starting in 2015, many of the new Medicare insurance plans and Affordable Care Act (Obamacare) Health plans offered take much longer for referrals. They have added additional paperwork and steps.  MRI's and CT's can take up to a week for the test. (Emergencies like strokes take precedence. I will tell you if you have an emergency.)   If your referral is to an in-network [facility] office, their office may contact you directly prior to our office reaching you.  -- Examples: [facility], [facility], [facility], [facility], [facility], and many more.  Specialist appointment times vary a great deal, mostly on the specialist's schedule and if they have openings. -- Our office tries to get you in as fast as possible. -- Some specialists have very long wait times. (Example. Dermatology. Usually months) -- If you have a true emergency like new cancer, we work to get you in ASAP.   

## 2014-03-07 NOTE — Progress Notes (Signed)
[place] [address] Phone: [phone] Fax: [phone]  Patient ID: Molly Peters MRN: [id number], DOB: 15-Nov-1960, 53 y.o. Date of Encounter: 03/07/2014  Primary Physician:  Viviana Simpler, MD   Chief Complaint: Shoulder Pain, Neck Pain and Weight Check   Subjective:   History of Present Illness:  Molly Peters is a 53 y.o. pleasant patient who presents with the following:  Bilateral shoulder pain: the patient is here for evaluation of bilateral shoulder pain. I saw her approximately 2 and half years ago and she had notable loss of motion in multiple directions, consistent with adhesive capsulitis. I made a number of recommendations at that time, which the patient ultimately did not keep.  She really is a case if chronic spine pain both of the neck and lumbar spine with failed spine surgery, status post multiple operative procedures detailed below. I will defer the management of these issues to neurosurgery.  Her shoulders essentially are hurting her most of the time, she has not done any physical therapy, she has not done any home exercise program, and I did inject her shoulders on her last office visit, doing an intra-articular injection. Again, this was approaching 3 years ago with significant worsening in the interval time.  Dr. Arneta Cliche did neck fusion, 2 years ago.   Patient Active Problem List   Diagnosis Date Noted  . Discharge of breast 07/06/2013  . Routine general medical examination at a health care facility 04/05/2013  . Memory loss 04/05/2013  . CAROTID BRUIT, LEFT 01/02/2011  . RESTLESS LEG SYNDROME 10/21/2010  . HYPERTENSION 06/23/2010  . BACK PAIN, LUMBAR, WITH RADICULOPATHY 11/12/2009  . HYPERLIPIDEMIA-MIXED 10/23/2009  . VALVULAR HEART DISEASE 03/27/2009  . IRRITABLE BOWEL SYNDROME 11/13/2008  . FIBROMYALGIA 10/03/2007  . ANXIETY 09/07/2007  . TOBACCO ABUSE 09/07/2007  . GLAUCOMA 09/07/2007  . HEMORRHOIDS 09/07/2007  .  GASTRIC ULCER 09/07/2007  . DEPRESSION 08/08/2007  . COPD 08/08/2007  . GERD 08/08/2007  . SEIZURE DISORDER 08/08/2007    Past Medical History  Diagnosis Date  . COPD (chronic obstructive pulmonary disease)   . Depression   . GERD (gastroesophageal reflux disease)   . Anxiety   . Glaucoma   . Arthritis   . Hypertension   . Seizure disorder   . Valvular heart disease     Initial workup a number of years ago at [facility].  Echo (10/2009) showed EF 60-65%,      normal LV size, moderate aortic insufficiency with a trileaflet aortic valve and normal aortic root size.  Mild      mitral regurgitation.   . Hemorrhoids     internal and external  . Fibromyalgia   . Anal fissure   . Chronic headaches   . IBS (irritable bowel syndrome)   . Chest pain     Past Surgical History  Procedure Laterality Date  . US echocardiography  1995  . Cardiac catheterization    . Abdominal hysterectomy    . Breast biopsy  1989    several  . Anterior fusion lumbar spine      L-S fusion with cage - [facility] 07/03  . Esophagogastroduodenoscopy  09/28/2008    GERD, Gastritis, Candidiasis  . Lumbar laminectomy/decompression microdiscectomy      cyst removal 01/08  . Lumbar spine surgery      cyst removed/rod inserted 1/11    Dr Christella Noa  . Nm myoview ltd      Lexiscan myoview (10/2009): EF 77%, normal wall  motion, normal perfusion.   Marland Kitchen Anterior cervical decomp/discectomy fusion  4/12    Dr Donley Redder;;  . Colonoscopy  09/28/2008    internal and external hemorrhoids    History   Social History  . Marital Status: Married    Spouse Name: N/A    Number of Children: 3  . Years of Education: N/A   Occupational History  . Disabled 2003    Social History Main Topics  . Smoking status: Current Every Day Smoker -- 1.00 packs/day for 38 years    Types: Cigarettes  . Smokeless tobacco: Never Used     Comment: down to 3 cigarettes per day. Uses nicotine inhaler--this helped her in the past  . Alcohol Use: No    . Drug Use: Yes    Special: Marijuana  . Sexual Activity: Not on file   Other Topics Concern  . Not on file   Social History Narrative        Family History  Problem Relation Age of Onset  . Arthritis Mother   . Cervical cancer Mother   . Healthy Father   . Cancer Father     colon cancer  . Migraines Sister   . Cervical cancer Sister   . Migraines Brother   . Asthma Daughter   . Coronary artery disease Maternal Grandmother   . Hypertension Maternal Grandmother   . Diabetes Maternal Grandmother   . Coronary artery disease Paternal Grandmother   . Hypertension Paternal Grandmother   . Diabetes Paternal Grandmother     Allergies  Allergen Reactions  . Carbamazepine     REACTION: unspecified  . Clonazepam     REACTION: unspecified  . Diphenhydramine Hcl     REACTION: unspecified  . Divalproex Sodium     REACTION: unspecified  . Tiotropium Bromide Monohydrate     REACTION: cough and throat problems    Medication list has been reviewed and updated.  Review of Systems:  GEN: No fevers, chills. Nontoxic. Primarily MSK c/o today. MSK: Detailed in the HPI GI: tolerating PO intake without difficulty Neuro: as above Otherwise the pertinent positives of the ROS are noted above.   Objective:   Physical Examination: BP 106/74  Pulse 85  Temp(Src) 97.9 F (36.6 C) (Oral)  Ht 5' 0.25" (1.53 m)  Wt 99 lb 12 oz (45.246 kg)  BMI 19.33 kg/m2  SpO2 94%  Ideal Body Weight: Weight in (lb) to have BMI = 25: 128.8   GEN: WDWN, NAD, Non-toxic, Alert & Oriented x 3 HEENT: Atraumatic, Normocephalic.  Ears and Nose: No external deformity. EXTR: No clubbing/cyanosis/edema NEURO: Normal gait.  PSYCH: Normally interactive. Conversant. Not depressed or anxious appearing.  Calm demeanor.   Shoulder: R and L Inspection: No muscle wasting or winging Ecchymosis/edema: neg  AC joint, scapula, clavicle: NT Cervical spine: notable limitation in motion, loss of approximately  30 percent of motion globally. Spurling's: neg ABNORMAL SIDE TESTED: B Abduction: 5/5, LIMITED TO 75 DEGREES Flexion: 5/5, LIMITED TO 75 DEG IR, lift-off: 5/5. minimal ER at neutral:  Minimal motion AC crossover and compression: PAIN Drop Test: neg Empty Can: neg Supraspinatus insertion: NT Bicipital groove: NT ALL OTHER SPECIAL TESTING EQUIVOCAL GIVEN LOSS OF MOTION C5-T1 intact Sensation intact Grip 5/5    Radiology: Dg Shoulder Right  03/07/2014   CLINICAL DATA:  frozen shoulder x 2 years  EXAM: RIGHT SHOULDER - 2+ VIEW  COMPARISON:  None.  FINDINGS: There is no evidence of fracture or dislocation. There are areas of  hypertrophic spurring along the humeral head. Soft tissues unremarkable.  IMPRESSION: Mild osteoarthritic changes in the humeral head. No acute osseous abnormalities.   Electronically Signed   By: Margaree Mackintosh M.D.   On: 03/07/2014 15:10   Dg Shoulder Left  03/07/2014   CLINICAL DATA:  Left shoulder pain.  EXAM: LEFT SHOULDER - 2+ VIEW  COMPARISON:  None.  FINDINGS: Visualized ribs appear normal. No fracture or dislocation is noted. Moderate narrowing of the glenohumeral joint is noted consistent with degenerative joint disease.  IMPRESSION: Moderate degenerative joint disease of the left glenohumeral joint. No acute abnormality seen in the left shoulder.   Electronically Signed   By: Sabino Dick M.D.   On: 03/07/2014 15:09    Assessment & Plan:   Bilateral shoulder pain - Plan: Ambulatory referral to Orthopedic Surgery, DG Shoulder Right, DG Shoulder Left  Frozen shoulder syndrome - Plan: Ambulatory referral to Orthopedic Surgery, DG Shoulder Right, DG Shoulder Left  Glenohumeral arthritis  Severe frozen shoulder, length of symptoms approaching 3 years. The patient really has not done any adequate trial of conservative management, she refuses physical therapy and other suggestions, and she has a marked worsening of her symptoms compared to my evaluation which was  again well over 2 years ago. I think I have minimal to offer this patient.  Loss of motion would appear to be greater than would be consistent with her amount of osteoarthritic change.  Some shoulder blade pain could be secondary to neck involvement.  The patient asked me for Percocet twice. Once this was very loudly and in the hallway and observed by Ms. Loring. Abuse or diversion could be a possibility. I will cc: Dr. Silvio Pate.  Consult Dr. Mardelle Matte for consideration of definitive management.  Follow-up: none Red flags were reviewed with the patient.  New Prescriptions   TIZANIDINE (ZANAFLEX) 4 MG TABLET    Take 1 tablet (4 mg total) by mouth Nightly.   Orders Placed This Encounter  Procedures  . DG Shoulder Right  . DG Shoulder Left  . Ambulatory referral to Orthopedic Surgery    Signed,  Frederico Hamman T. Kurstyn Larios, MD, [facility]  Patient's Medications  New Prescriptions   TIZANIDINE (ZANAFLEX) 4 MG TABLET    Take 1 tablet (4 mg total) by mouth Nightly.  Previous Medications   ALPRAZOLAM (XANAX) 1 MG TABLET    Take 1 tablet (1 mg total) by mouth 3 (three) times daily as needed for anxiety.   GABAPENTIN (NEURONTIN) 300 MG CAPSULE    TAKE 1 CAPSULE TWICE DAILY  AND TAKE 2 CAPSULES AT BEDTIME  (INCREASE AS DIRECTED)   LAMOTRIGINE (LAMICTAL) 100 MG TABLET    TAKE 1 TABLET TWICE DAILY   LOSARTAN-HYDROCHLOROTHIAZIDE (HYZAAR) 100-12.5 MG PER TABLET    TAKE 1 TABLET EVERY DAY   OMEPRAZOLE (PRILOSEC) 20 MG CAPSULE    Take 1 capsule (20 mg total) by mouth daily.   PAROXETINE (PAXIL) 20 MG TABLET    TAKE 1 TABLET EVERY MORNING   POTASSIUM CHLORIDE SA (K-DUR,KLOR-CON) 20 MEQ TABLET    Take 1 tablet (20 mEq total) by mouth 2 (two) times daily.   SIMVASTATIN (ZOCOR) 20 MG TABLET    Take 1 tablet (20 mg total) by mouth at bedtime.  Modified Medications   No medications on file  Discontinued Medications   ALBUTEROL (PROVENTIL HFA;VENTOLIN HFA) 108 (90 BASE) MCG/ACT INHALER    Inhale 2  puffs into the lungs every 6 (six) hours as needed.  BUDESONIDE-FORMOTEROL (SYMBICORT) 160-4.5 MCG/ACT INHALER    Inhale 2 puffs into the lungs 2 (two) times daily.

## 2014-03-07 NOTE — Progress Notes (Signed)
Pre visit review using our clinic review tool, if applicable. No additional management support is needed unless otherwise documented below in the visit note. 

## 2014-03-07 NOTE — Telephone Encounter (Signed)
Relevant patient education assigned to patient using Emmi. ° °

## 2014-03-19 ENCOUNTER — Other Ambulatory Visit: Payer: Self-pay | Admitting: Orthopedic Surgery

## 2014-03-19 ENCOUNTER — Ambulatory Visit: Payer: Medicare PPO | Admitting: Cardiovascular Disease

## 2014-03-19 ENCOUNTER — Ambulatory Visit
Admission: RE | Admit: 2014-03-19 | Discharge: 2014-03-19 | Disposition: A | Discharge status: Home / Self Care | Payer: Medicare PPO | Source: Ambulatory Visit | Attending: Orthopedic Surgery | Admitting: Orthopedic Surgery

## 2014-03-19 DIAGNOSIS — Z01818 Encounter for other preprocedural examination: Secondary | ICD-10-CM

## 2014-03-20 ENCOUNTER — Encounter: Payer: Self-pay | Admitting: Cardiovascular Disease

## 2014-03-20 ENCOUNTER — Ambulatory Visit (INDEPENDENT_AMBULATORY_CARE_PROVIDER_SITE_OTHER): Payer: Medicare PPO | Admitting: Cardiovascular Disease

## 2014-03-20 VITALS — BP 110/88 | HR 83 | Ht 64.0 in | Wt 98.8 lb

## 2014-03-20 DIAGNOSIS — I38 Endocarditis, valve unspecified: Secondary | ICD-10-CM

## 2014-03-20 DIAGNOSIS — E785 Hyperlipidemia, unspecified: Secondary | ICD-10-CM

## 2014-03-20 DIAGNOSIS — J449 Chronic obstructive pulmonary disease, unspecified: Secondary | ICD-10-CM

## 2014-03-20 DIAGNOSIS — I10 Essential (primary) hypertension: Secondary | ICD-10-CM

## 2014-03-20 DIAGNOSIS — F17200 Nicotine dependence, unspecified, uncomplicated: Secondary | ICD-10-CM

## 2014-03-20 DIAGNOSIS — R079 Chest pain, unspecified: Secondary | ICD-10-CM

## 2014-03-20 DIAGNOSIS — Z01810 Encounter for preprocedural cardiovascular examination: Secondary | ICD-10-CM | POA: Insufficient documentation

## 2014-03-20 NOTE — Assessment & Plan Note (Signed)
Encouraged her to stay on her statin 

## 2014-03-20 NOTE — Assessment & Plan Note (Signed)
Blood pressure is well controlled on today's visit. No changes made to the medications. 

## 2014-03-20 NOTE — Assessment & Plan Note (Signed)
Chronic mild shortness of breath. She does not seem particularly bothered without inhalers. She reports she is unable to afford many of the inhalers anyway.

## 2014-03-20 NOTE — Patient Instructions (Signed)
You are doing well. No medication changes were made.  Please call us if you have new issues that need to be addressed before your next appt.  Your physician wants you to follow-up in: 12 months.  You will receive a reminder letter in the mail two months in advance. If you don't receive a letter, please call our office to schedule the follow-up appointment. 

## 2014-03-20 NOTE — Progress Notes (Signed)
Patient ID: Molly Peters, female    DOB: 1961/04/19, 53 y.o.   MRN: [id number]  HPI Comments: 53 yo last seen in 2013, with prior history of mild  aortic insufficiency and COPD, long history of smoking, who presents for routine followup .  Previous episodes of chest pain with treadmill Myoview showing no ischemia in late 2011.  She reports that she continues to have severe bilateral shoulder pain, most severe on the right. Also has Chronic back pain and sciatica.  She has had several back surgeries. Denies having any significant chest tightness or shortness of breath with exertion. He is limited by her arthritic pain. She is unable to afford her inhalers, currently does not take any. previously on albuterol, Advair, Symbicort.   She reports having a history of ulcers in the past. She's currently taking a proton pump inhibitor Occasionally has sweats. She attributes this to the pain medication     EKG shows normal sinus rhythm with rate 83 beats per minute with no significant ST or T wave changes    Outpatient Encounter Prescriptions as of 03/20/2014  Medication Sig  . ALPRAZolam (XANAX) 1 MG tablet Take 1 tablet (1 mg total) by mouth 3 (three) times daily as needed for anxiety.  . gabapentin (NEURONTIN) 300 MG capsule TAKE 1 CAPSULE TWICE DAILY  AND TAKE 2 CAPSULES AT BEDTIME  (INCREASE AS DIRECTED)  . HYDROcodone-acetaminophen (NORCO) 10-325 MG per tablet Take 1 tablet by mouth every 4 (four) hours as needed.   . lamoTRIgine (LAMICTAL) 100 MG tablet TAKE 1 TABLET TWICE DAILY  . losartan-hydrochlorothiazide (HYZAAR) 100-12.5 MG per tablet TAKE 1 TABLET EVERY DAY  . omeprazole (PRILOSEC) 20 MG capsule Take 1 capsule (20 mg total) by mouth daily.  Marland Kitchen PARoxetine (PAXIL) 20 MG tablet TAKE 1 TABLET EVERY MORNING  . potassium chloride SA (K-DUR,KLOR-CON) 20 MEQ tablet Take 1 tablet (20 mEq total) by mouth 2 (two) times daily.  . simvastatin (ZOCOR) 20 MG tablet Take 1 tablet (20 mg total)  by mouth at bedtime.  Marland Kitchen tiZANidine (ZANAFLEX) 4 MG tablet Take 1 tablet (4 mg total) by mouth Nightly.    Review of Systems  Constitutional: Negative.   HENT: Negative.   Eyes: Negative.   Respiratory: Negative.   Cardiovascular: Negative.   Gastrointestinal: Negative.   Endocrine: Negative.   Musculoskeletal: Positive for arthralgias, back pain, joint swelling and neck pain.  Skin: Negative.   Allergic/Immunologic: Negative.   Neurological: Negative.   Hematological: Negative.   Psychiatric/Behavioral: Negative.   All other systems reviewed and are negative.   BP 110/88  Pulse 83  Ht 5\' 4"  (1.626 m)  Wt 98 lb 12 oz (44.793 kg)  BMI 16.94 kg/m2  Physical Exam  Nursing note and vitals reviewed. Constitutional: She is oriented to person, place, and time. She appears well-developed and well-nourished.  HENT:  Head: Normocephalic.  Nose: Nose normal.  Mouth/Throat: Oropharynx is clear and moist.  Eyes: Conjunctivae are normal. Pupils are equal, round, and reactive to light.  Neck: Normal range of motion. Neck supple. No JVD present.  Cardiovascular: Normal rate, regular rhythm, S1 normal, S2 normal, normal heart sounds and intact distal pulses.  Exam reveals no gallop and no friction rub.   No murmur heard. Pulmonary/Chest: Effort normal and breath sounds normal. No respiratory distress. She has no wheezes. She has no rales. She exhibits no tenderness.  Abdominal: Soft. Bowel sounds are normal. She exhibits no distension. There is no tenderness.  Musculoskeletal: Normal  range of motion. She exhibits no edema and no tenderness.  Lymphadenopathy:    She has no cervical adenopathy.  Neurological: She is alert and oriented to person, place, and time. Coordination normal.  Skin: Skin is warm and dry. No rash noted. No erythema.  Psychiatric: She has a normal mood and affect. Her behavior is normal. Judgment and thought content normal.    Assessment and Plan

## 2014-03-20 NOTE — Assessment & Plan Note (Addendum)
Currently with no symptoms of angina. Stable mild shortness of breath from COPD. No changes to her medications. No further workup at this time. She would be acceptable risk for upcoming shoulder surgery.

## 2014-03-20 NOTE — Assessment & Plan Note (Signed)
She smokes several cigarettes per day, predominantly electronic cigarette. Smoking cessation discussed with her.

## 2014-03-27 ENCOUNTER — Ambulatory Visit: Payer: Medicare PPO | Admitting: Internal Medicine

## 2014-04-04 ENCOUNTER — Encounter: Payer: Self-pay | Admitting: Internal Medicine

## 2014-04-04 ENCOUNTER — Ambulatory Visit (INDEPENDENT_AMBULATORY_CARE_PROVIDER_SITE_OTHER): Payer: Medicare PPO | Admitting: Internal Medicine

## 2014-04-04 VITALS — BP 118/70 | HR 73 | Temp 98.4°F | Wt 98.0 lb

## 2014-04-04 DIAGNOSIS — I10 Essential (primary) hypertension: Secondary | ICD-10-CM

## 2014-04-04 DIAGNOSIS — F411 Generalized anxiety disorder: Secondary | ICD-10-CM

## 2014-04-04 DIAGNOSIS — J449 Chronic obstructive pulmonary disease, unspecified: Secondary | ICD-10-CM

## 2014-04-04 DIAGNOSIS — E785 Hyperlipidemia, unspecified: Secondary | ICD-10-CM

## 2014-04-04 LAB — CBC WITH DIFFERENTIAL/PLATELET
BASOS ABS: 0.1 10*3/uL (ref 0.0–0.1)
Basophils Relative: 0.5 % (ref 0.0–3.0)
EOS ABS: 0.1 10*3/uL (ref 0.0–0.7)
Eosinophils Relative: 0.6 % (ref 0.0–5.0)
HEMATOCRIT: 40.2 % (ref 36.0–46.0)
Hemoglobin: 13 g/dL (ref 12.0–15.0)
LYMPHS ABS: 4.7 10*3/uL — AB (ref 0.7–4.0)
Lymphocytes Relative: 41.4 % (ref 12.0–46.0)
MCHC: 32.4 g/dL (ref 30.0–36.0)
MCV: 90.5 fl (ref 78.0–100.0)
MONO ABS: 0.8 10*3/uL (ref 0.1–1.0)
Monocytes Relative: 6.6 % (ref 3.0–12.0)
NEUTROS PCT: 50.9 % (ref 43.0–77.0)
Neutro Abs: 5.8 10*3/uL (ref 1.4–7.7)
Platelets: 404 10*3/uL — ABNORMAL HIGH (ref 150.0–400.0)
RBC: 4.44 Mil/uL (ref 3.87–5.11)
RDW: 14.1 % (ref 11.5–15.5)
WBC: 11.5 10*3/uL — AB (ref 4.0–10.5)

## 2014-04-04 LAB — COMPREHENSIVE METABOLIC PANEL
ALK PHOS: 80 U/L (ref 39–117)
ALT: 8 U/L (ref 0–35)
AST: 18 U/L (ref 0–37)
Albumin: 4.2 g/dL (ref 3.5–5.2)
BILIRUBIN TOTAL: 0.3 mg/dL (ref 0.2–1.2)
BUN: 16 mg/dL (ref 6–23)
CO2: 28 mEq/L (ref 19–32)
Calcium: 9.6 mg/dL (ref 8.4–10.5)
Chloride: 101 mEq/L (ref 96–112)
Creatinine, Ser: 1 mg/dL (ref 0.4–1.2)
GFR: 64.29 mL/min (ref >60.00)
Glucose, Bld: 79 mg/dL (ref 70–99)
POTASSIUM: 4.5 meq/L (ref 3.5–5.1)
SODIUM: 139 meq/L (ref 135–145)
Total Protein: 7.5 g/dL (ref 6.0–8.3)

## 2014-04-04 LAB — T4, FREE: FREE T4: 0.78 ng/dL (ref 0.60–1.60)

## 2014-04-04 LAB — TSH: TSH: 0.3 u[IU]/mL — AB (ref 0.35–4.50)

## 2014-04-04 NOTE — Assessment & Plan Note (Signed)
BP Readings from Last 3 Encounters:  04/04/14 118/70  03/20/14 110/88  03/07/14 106/74   Good control

## 2014-04-04 NOTE — Progress Notes (Signed)
Pre visit review using our clinic review tool, if applicable. No additional management support is needed unless otherwise documented below in the visit note. 

## 2014-04-04 NOTE — Assessment & Plan Note (Signed)
Controlled with her current meds No changes

## 2014-04-04 NOTE — Assessment & Plan Note (Signed)
Due for labs

## 2014-04-04 NOTE — Assessment & Plan Note (Signed)
Mild allergy symptoms but lungs are clear No infection or active disease Okay for her low risk surgery  Really counseled that she must stop smoking Can try the patch

## 2014-04-04 NOTE — Patient Instructions (Signed)
Please stop smoking completely. You can try a 21 or 22mg  patch daily for at least 2 months (and then wean down with the lower doses). Call 1-800 QUIT NOW for help  Try loratadine 10mg  1-2 tabs daily for the drainage and congestion

## 2014-04-04 NOTE — Progress Notes (Signed)
Subjective:    Patient ID: Molly Peters, female    DOB: 09-16-1961, 53 y.o.   MRN: [id number]  HPI Having shoulder surgery by Dr Mardelle Matte Has decided to go with simpler debridement by arthroscopy Then total shoulder replacement if not doing better  Has tried to cut back on smoking Using e-cigarette 6 puffs a day. 3/4PPD of cigarettes still Breathing is okay ---as long as she doesn't exert herself Will tire after 15 minutes of pulling weeds, for example. Son does most housework--due to her shoulder Can go up stairs once--but has to be slow  Has sinus drainage Pressure over right eye last week--better now No real change in past year Worse with pollen--can feel it in her throat Has not uses any meds--did use claritin in the past Stable mild cough-- regular sputum (especially in the morning) No fever  Anxiety is intermittently a problem Chronic sleep problems  No recent seizures Current Outpatient Prescriptions on File Prior to Visit  Medication Sig Dispense Refill  . ALPRAZolam (XANAX) 1 MG tablet Take 1 tablet (1 mg total) by mouth 3 (three) times daily as needed for anxiety.  90 tablet  0  . gabapentin (NEURONTIN) 300 MG capsule TAKE 1 CAPSULE TWICE DAILY  AND TAKE 2 CAPSULES AT BEDTIME  (INCREASE AS DIRECTED)  360 capsule  0  . HYDROcodone-acetaminophen (NORCO) 10-325 MG per tablet Take 1 tablet by mouth every 4 (four) hours as needed.       . lamoTRIgine (LAMICTAL) 100 MG tablet TAKE 1 TABLET TWICE DAILY  180 tablet  1  . losartan-hydrochlorothiazide (HYZAAR) 100-12.5 MG per tablet TAKE 1 TABLET EVERY DAY  90 tablet  0  . omeprazole (PRILOSEC) 20 MG capsule Take 1 capsule (20 mg total) by mouth daily.  90 capsule  3  . PARoxetine (PAXIL) 20 MG tablet TAKE 1 TABLET EVERY MORNING  90 tablet  3  . potassium chloride SA (K-DUR,KLOR-CON) 20 MEQ tablet Take 1 tablet (20 mEq total) by mouth 2 (two) times daily.  60 tablet  11  . simvastatin (ZOCOR) 20 MG tablet Take 1 tablet (20  mg total) by mouth at bedtime.  90 tablet  3  . tiZANidine (ZANAFLEX) 4 MG tablet Take 1 tablet (4 mg total) by mouth Nightly.  30 tablet  2   No current facility-administered medications on file prior to visit.    Allergies  Allergen Reactions  . Carbamazepine     REACTION: unspecified  . Clonazepam     REACTION: unspecified  . Diphenhydramine Hcl     REACTION: unspecified  . Divalproex Sodium     REACTION: unspecified  . Tiotropium Bromide Monohydrate     REACTION: cough and throat problems    Past Medical History  Diagnosis Date  . COPD (chronic obstructive pulmonary disease)   . Depression   . GERD (gastroesophageal reflux disease)   . Anxiety   . Glaucoma   . Arthritis   . Hypertension   . Seizure disorder   . Valvular heart disease     Initial workup a number of years ago at [facility].  Echo (10/2009) showed EF 60-65%,      normal LV size, moderate aortic insufficiency with a trileaflet aortic valve and normal aortic root size.  Mild      mitral regurgitation.   . Hemorrhoids     internal and external  . Fibromyalgia   . Anal fissure   . Chronic headaches   . IBS (irritable bowel syndrome)   .  Chest pain     Past Surgical History  Procedure Laterality Date  . US echocardiography  1995  . Cardiac catheterization    . Abdominal hysterectomy    . Breast biopsy  1989    several  . Anterior fusion lumbar spine      L-S fusion with cage - [facility] 07/03  . Esophagogastroduodenoscopy  09/28/2008    GERD, Gastritis, Candidiasis  . Lumbar laminectomy/decompression microdiscectomy      cyst removal 01/08  . Lumbar spine surgery      cyst removed/rod inserted 1/11    Dr Christella Noa  . Nm myoview ltd      Lexiscan myoview (10/2009): EF 77%, normal wall motion, normal perfusion.   Marland Kitchen Anterior cervical decomp/discectomy fusion  4/12    Dr Donley Redder;;  . Colonoscopy  09/28/2008    internal and external hemorrhoids    Family History  Problem Relation Age of Onset  . Arthritis  Mother   . Cervical cancer Mother   . Healthy Father   . Cancer Father     colon cancer  . Migraines Sister   . Cervical cancer Sister   . Migraines Brother   . Asthma Daughter   . Coronary artery disease Maternal Grandmother   . Hypertension Maternal Grandmother   . Diabetes Maternal Grandmother   . Coronary artery disease Paternal Grandmother   . Hypertension Paternal Grandmother   . Diabetes Paternal Grandmother     History   Social History  . Marital Status: Married    Spouse Name: N/A    Number of Children: 3  . Years of Education: N/A   Occupational History  . Disabled 2003    Social History Main Topics  . Smoking status: Current Every Day Smoker -- 1.00 packs/day for 38 years    Types: Cigarettes  . Smokeless tobacco: Never Used     Comment: down to 3 cigarettes per day. Uses nicotine inhaler--this helped her in the past  . Alcohol Use: No  . Drug Use: Yes    Special: Marijuana  . Sexual Activity: Not on file   Other Topics Concern  . Not on file   Social History Narrative       Review of Systems Still smokes some pot---helps her rest Appetite not great---weight has stabilized somewhat     Objective:   Physical Exam  Constitutional: She appears well-developed. No distress.  HENT:  Mouth/Throat: Oropharynx is clear and moist. No oropharyngeal exudate.  Neck: Normal range of motion. Neck supple. No thyromegaly present.  Cardiovascular: Normal rate and regular rhythm.  Exam reveals no gallop.   Early diastolic murmur  Pulmonary/Chest: Effort normal and breath sounds normal. No respiratory distress. She has no wheezes. She has no rales.  Fairly good air movement  Abdominal: Soft. There is no tenderness.  Musculoskeletal: She exhibits no edema and no tenderness.  Lymphadenopathy:    She has no cervical adenopathy.  Psychiatric: She has a normal mood and affect. Her behavior is normal.          Assessment & Plan:

## 2014-04-05 ENCOUNTER — Ambulatory Visit: Payer: Medicare PPO

## 2014-04-05 ENCOUNTER — Telehealth: Payer: Self-pay | Admitting: Internal Medicine

## 2014-04-05 DIAGNOSIS — E7800 Pure hypercholesterolemia, unspecified: Secondary | ICD-10-CM

## 2014-04-05 LAB — LIPID PANEL
CHOLESTEROL: 148 mg/dL (ref 0–200)
HDL: 42.7 mg/dL (ref >39.00)
LDL Cholesterol: 82 mg/dL (ref 0–99)
Total CHOL/HDL Ratio: 3
Triglycerides: 116 mg/dL (ref 0.0–149.0)
VLDL: 23.2 mg/dL (ref 0.0–40.0)

## 2014-04-05 NOTE — Telephone Encounter (Signed)
Relevant patient education assigned to patient using Emmi. ° °

## 2014-04-09 ENCOUNTER — Other Ambulatory Visit: Payer: Self-pay | Admitting: Orthopedic Surgery

## 2014-04-10 ENCOUNTER — Encounter (HOSPITAL_BASED_OUTPATIENT_CLINIC_OR_DEPARTMENT_OTHER): Payer: Self-pay | Admitting: *Deleted

## 2014-04-10 ENCOUNTER — Encounter: Payer: Self-pay | Admitting: *Deleted

## 2014-04-10 NOTE — Progress Notes (Signed)
Had labs done 04/04/14-ekg cardiac clearance,copd controlled-

## 2014-04-13 ENCOUNTER — Ambulatory Visit (HOSPITAL_BASED_OUTPATIENT_CLINIC_OR_DEPARTMENT_OTHER): Payer: Medicare PPO | Admitting: Anesthesiology

## 2014-04-13 ENCOUNTER — Ambulatory Visit (HOSPITAL_BASED_OUTPATIENT_CLINIC_OR_DEPARTMENT_OTHER)
Admission: RE | Admit: 2014-04-13 | Discharge: 2014-04-13 | Disposition: A | Discharge status: Home / Self Care | Payer: Medicare PPO | Source: Ambulatory Visit | Attending: Orthopedic Surgery | Admitting: Orthopedic Surgery

## 2014-04-13 ENCOUNTER — Encounter (HOSPITAL_BASED_OUTPATIENT_CLINIC_OR_DEPARTMENT_OTHER)
Admission: RE | Disposition: A | Discharge status: Home / Self Care | Payer: Self-pay | Source: Ambulatory Visit | Attending: Orthopedic Surgery

## 2014-04-13 ENCOUNTER — Encounter (HOSPITAL_BASED_OUTPATIENT_CLINIC_OR_DEPARTMENT_OTHER): Payer: Medicare PPO | Admitting: Anesthesiology

## 2014-04-13 ENCOUNTER — Encounter (HOSPITAL_BASED_OUTPATIENT_CLINIC_OR_DEPARTMENT_OTHER): Payer: Self-pay | Admitting: Anesthesiology

## 2014-04-13 DIAGNOSIS — Z888 Allergy status to other drugs, medicaments and biological substances status: Secondary | ICD-10-CM | POA: Insufficient documentation

## 2014-04-13 DIAGNOSIS — R51 Headache: Secondary | ICD-10-CM | POA: Insufficient documentation

## 2014-04-13 DIAGNOSIS — K219 Gastro-esophageal reflux disease without esophagitis: Secondary | ICD-10-CM | POA: Insufficient documentation

## 2014-04-13 DIAGNOSIS — M19011 Primary osteoarthritis, right shoulder: Secondary | ICD-10-CM | POA: Diagnosis present

## 2014-04-13 DIAGNOSIS — F17200 Nicotine dependence, unspecified, uncomplicated: Secondary | ICD-10-CM | POA: Insufficient documentation

## 2014-04-13 DIAGNOSIS — F411 Generalized anxiety disorder: Secondary | ICD-10-CM | POA: Insufficient documentation

## 2014-04-13 DIAGNOSIS — J449 Chronic obstructive pulmonary disease, unspecified: Secondary | ICD-10-CM | POA: Insufficient documentation

## 2014-04-13 DIAGNOSIS — Z79899 Other long term (current) drug therapy: Secondary | ICD-10-CM | POA: Insufficient documentation

## 2014-04-13 DIAGNOSIS — F1920 Other psychoactive substance dependence, uncomplicated: Secondary | ICD-10-CM | POA: Insufficient documentation

## 2014-04-13 DIAGNOSIS — IMO0001 Reserved for inherently not codable concepts without codable children: Secondary | ICD-10-CM | POA: Insufficient documentation

## 2014-04-13 DIAGNOSIS — G40909 Epilepsy, unspecified, not intractable, without status epilepticus: Secondary | ICD-10-CM | POA: Insufficient documentation

## 2014-04-13 DIAGNOSIS — K279 Peptic ulcer, site unspecified, unspecified as acute or chronic, without hemorrhage or perforation: Secondary | ICD-10-CM | POA: Insufficient documentation

## 2014-04-13 DIAGNOSIS — I10 Essential (primary) hypertension: Secondary | ICD-10-CM | POA: Insufficient documentation

## 2014-04-13 DIAGNOSIS — F3289 Other specified depressive episodes: Secondary | ICD-10-CM | POA: Insufficient documentation

## 2014-04-13 DIAGNOSIS — H409 Unspecified glaucoma: Secondary | ICD-10-CM | POA: Insufficient documentation

## 2014-04-13 DIAGNOSIS — F329 Major depressive disorder, single episode, unspecified: Secondary | ICD-10-CM | POA: Insufficient documentation

## 2014-04-13 DIAGNOSIS — K589 Irritable bowel syndrome without diarrhea: Secondary | ICD-10-CM | POA: Insufficient documentation

## 2014-04-13 DIAGNOSIS — M7500 Adhesive capsulitis of unspecified shoulder: Secondary | ICD-10-CM | POA: Insufficient documentation

## 2014-04-13 DIAGNOSIS — M19019 Primary osteoarthritis, unspecified shoulder: Secondary | ICD-10-CM | POA: Insufficient documentation

## 2014-04-13 DIAGNOSIS — J4489 Other specified chronic obstructive pulmonary disease: Secondary | ICD-10-CM | POA: Insufficient documentation

## 2014-04-13 HISTORY — PX: SHOULDER ARTHROSCOPY WITH DEBRIDEMENT AND BICEP TENDON REPAIR: SHX5690

## 2014-04-13 HISTORY — DX: Unspecified convulsions: R56.9

## 2014-04-13 HISTORY — DX: Primary osteoarthritis, right shoulder: M19.011

## 2014-04-13 SURGERY — SHOULDER ARTHROSCOPY WITH DEBRIDEMENT AND BICEP TENDON REPAIR
Anesthesia: General | Site: Shoulder | Laterality: Right

## 2014-04-13 MED ORDER — PROPOFOL 10 MG/ML IV BOLUS
INTRAVENOUS | Status: DC | PRN
  Administered 2014-04-13: 150 mg via INTRAVENOUS

## 2014-04-13 MED ORDER — FENTANYL CITRATE 0.05 MG/ML IJ SOLN
50.0000 ug | INTRAMUSCULAR | Status: DC | PRN
  Administered 2014-04-13: 100 ug via INTRAVENOUS

## 2014-04-13 MED ORDER — CEFAZOLIN SODIUM-DEXTROSE 2-3 GM-% IV SOLR: 2.0000 g | INTRAVENOUS | Status: DC

## 2014-04-13 MED ORDER — PHENYLEPHRINE HCL 10 MG/ML IJ SOLN
INTRAMUSCULAR | Status: DC | PRN
  Administered 2014-04-13: 40 ug via INTRAVENOUS
  Administered 2014-04-13: 80 ug via INTRAVENOUS
  Administered 2014-04-13 (×2): 40 ug via INTRAVENOUS

## 2014-04-13 MED ORDER — SENNA-DOCUSATE SODIUM 8.6-50 MG PO TABS: 2.0000 | ORAL_TABLET | Freq: Every day | ORAL | Status: DC

## 2014-04-13 MED ORDER — PROMETHAZINE HCL 25 MG/ML IJ SOLN: 6.2500 mg | INTRAMUSCULAR | Status: DC | PRN

## 2014-04-13 MED ORDER — DEXAMETHASONE SODIUM PHOSPHATE 4 MG/ML IJ SOLN: INTRAMUSCULAR | Status: DC | PRN

## 2014-04-13 MED ORDER — LIDOCAINE HCL (CARDIAC) 20 MG/ML IV SOLN: INTRAVENOUS | Status: DC | PRN

## 2014-04-13 MED ORDER — FENTANYL CITRATE 0.05 MG/ML IJ SOLN
INTRAMUSCULAR | Status: AC
  Filled 2014-04-13: qty 2

## 2014-04-13 MED ORDER — FENTANYL CITRATE 0.05 MG/ML IJ SOLN
INTRAMUSCULAR | Status: AC
  Filled 2014-04-13: qty 6

## 2014-04-13 MED ORDER — MIDAZOLAM HCL 2 MG/2ML IJ SOLN
INTRAMUSCULAR | Status: AC
  Filled 2014-04-13: qty 2

## 2014-04-13 MED ORDER — ROPIVACAINE HCL 5 MG/ML IJ SOLN
INTRAMUSCULAR | Status: DC | PRN
  Administered 2014-04-13: 30 mg via PERINEURAL

## 2014-04-13 MED ORDER — CEFAZOLIN SODIUM-DEXTROSE 2-3 GM-% IV SOLR
INTRAVENOUS | Status: AC
  Filled 2014-04-13: qty 50

## 2014-04-13 MED ORDER — LACTATED RINGERS IV SOLN
INTRAVENOUS | Status: DC | PRN
  Administered 2014-04-13: 08:00:00 via INTRAVENOUS

## 2014-04-13 MED ORDER — DEXAMETHASONE SODIUM PHOSPHATE 10 MG/ML IJ SOLN
INTRAMUSCULAR | Status: DC | PRN
  Administered 2014-04-13: 10 mg via INTRAVENOUS
  Administered 2014-04-13: 6 mg

## 2014-04-13 MED ORDER — KETOROLAC TROMETHAMINE 30 MG/ML IJ SOLN
INTRAMUSCULAR | Status: DC | PRN
  Administered 2014-04-13: 30 mg via INTRAVENOUS

## 2014-04-13 MED ORDER — ONDANSETRON HCL 4 MG/2ML IJ SOLN
INTRAMUSCULAR | Status: DC | PRN
  Administered 2014-04-13: 4 mg via INTRAVENOUS

## 2014-04-13 MED ORDER — OXYCODONE HCL 5 MG PO TABS: 5.0000 mg | ORAL_TABLET | Freq: Once | ORAL | Status: DC | PRN

## 2014-04-13 MED ORDER — HYDROMORPHONE HCL PF 1 MG/ML IJ SOLN: 0.2500 mg | INTRAMUSCULAR | Status: DC | PRN

## 2014-04-13 MED ORDER — SODIUM CHLORIDE 0.9 % IR SOLN
Status: DC | PRN
  Administered 2014-04-13: 3000 mL

## 2014-04-13 MED ORDER — OXYCODONE HCL 5 MG/5ML PO SOLN: 5.0000 mg | Freq: Once | ORAL | Status: DC | PRN

## 2014-04-13 MED ORDER — MIDAZOLAM HCL 2 MG/2ML IJ SOLN
1.0000 mg | INTRAMUSCULAR | Status: DC | PRN
  Administered 2014-04-13: 2 mg via INTRAVENOUS

## 2014-04-13 MED ORDER — PROMETHAZINE HCL 25 MG PO TABS: 25.0000 mg | ORAL_TABLET | Freq: Four times a day (QID) | ORAL | Status: DC | PRN

## 2014-04-13 MED ORDER — OXYCODONE-ACETAMINOPHEN 10-325 MG PO TABS: 1.0000 | ORAL_TABLET | Freq: Four times a day (QID) | ORAL | Status: DC | PRN

## 2014-04-13 MED ORDER — SUCCINYLCHOLINE CHLORIDE 20 MG/ML IJ SOLN
INTRAMUSCULAR | Status: DC | PRN
  Administered 2014-04-13: 100 mg via INTRAVENOUS

## 2014-04-13 MED ORDER — MELOXICAM 15 MG PO TABS: 15.0000 mg | ORAL_TABLET | Freq: Every day | ORAL | Status: DC

## 2014-04-13 MED ORDER — LACTATED RINGERS IV SOLN
INTRAVENOUS | Status: DC
  Administered 2014-04-13: 08:00:00 via INTRAVENOUS

## 2014-04-13 SURGICAL SUPPLY — 69 items
BLADE 15 SAFETY STRL DISP (BLADE) IMPLANT
BLADE 4.2CUDA (BLADE) IMPLANT
BLADE CUTTER GATOR 3.5 (BLADE) ×2 IMPLANT
BLADE GREAT WHITE 4.2 (BLADE) IMPLANT
BUR OVAL 4.0 (BURR) IMPLANT
BUR OVAL 6.0 (BURR) IMPLANT
CANNULA 5.75X71 LONG (CANNULA) ×2 IMPLANT
CANNULA TWIST IN 8.25X7CM (CANNULA) IMPLANT
DECANTER SPIKE VIAL GLASS SM (MISCELLANEOUS) IMPLANT
DRAPE INCISE IOBAN 66X45 STRL (DRAPES) ×2 IMPLANT
DRAPE SHOULDER BEACH CHAIR (DRAPES) ×2 IMPLANT
DRAPE U 20/CS (DRAPES) ×2 IMPLANT
DRAPE U-SHAPE 47X51 STRL (DRAPES) ×2 IMPLANT
DRSG PAD ABDOMINAL 8X10 ST (GAUZE/BANDAGES/DRESSINGS) ×2 IMPLANT
DURAPREP 26ML APPLICATOR (WOUND CARE) ×2 IMPLANT
ELECT REM PT RETURN 9FT ADLT (ELECTROSURGICAL) ×2
ELECTRODE REM PT RTRN 9FT ADLT (ELECTROSURGICAL) ×1 IMPLANT
FIBERSTICK 2 (SUTURE) IMPLANT
GAUZE SPONGE 4X4 12PLY STRL (GAUZE/BANDAGES/DRESSINGS) ×2 IMPLANT
GLOVE BIO SURGEON STRL SZ8 (GLOVE) ×2 IMPLANT
GLOVE BIOGEL PI IND STRL 7.5 (GLOVE) ×1 IMPLANT
GLOVE BIOGEL PI IND STRL 8 (GLOVE) ×2 IMPLANT
GLOVE BIOGEL PI INDICATOR 7.5 (GLOVE) ×1
GLOVE BIOGEL PI INDICATOR 8 (GLOVE) ×2
GLOVE ORTHO TXT STRL SZ7.5 (GLOVE) ×2 IMPLANT
GLOVE SURG SS PI 7.5 STRL IVOR (GLOVE) ×1 IMPLANT
GOWN STRL REUS W/ TWL LRG LVL3 (GOWN DISPOSABLE) ×1 IMPLANT
GOWN STRL REUS W/ TWL XL LVL3 (GOWN DISPOSABLE) ×2 IMPLANT
GOWN STRL REUS W/TWL LRG LVL3 (GOWN DISPOSABLE) ×2
GOWN STRL REUS W/TWL XL LVL3 (GOWN DISPOSABLE) ×4
IMMOBILIZER SHOULDER FOAM XLGE (SOFTGOODS) IMPLANT
KIT BIO-TENODESIS 3X8 DISP (MISCELLANEOUS)
KIT INSRT BABSR STRL DISP BTN (MISCELLANEOUS) IMPLANT
KIT SHOULDER TRACTION (DRAPES) ×2 IMPLANT
MANIFOLD NEPTUNE II (INSTRUMENTS) ×2 IMPLANT
NDL SCORPION MULTI FIRE (NEEDLE) IMPLANT
NDL SUT 6 .5 CRC .975X.05 MAYO (NEEDLE) IMPLANT
NEEDLE MAYO TAPER (NEEDLE)
NEEDLE SCORPION MULTI FIRE (NEEDLE) IMPLANT
PACK ARTHROSCOPY DSU (CUSTOM PROCEDURE TRAY) ×2 IMPLANT
PACK BASIN DAY SURGERY FS (CUSTOM PROCEDURE TRAY) ×2 IMPLANT
PENCIL BUTTON HOLSTER BLD 10FT (ELECTRODE) ×1 IMPLANT
SET ARTHROSCOPY TUBING (MISCELLANEOUS) ×2
SET ARTHROSCOPY TUBING LN (MISCELLANEOUS) ×1 IMPLANT
SHEET MEDIUM DRAPE 40X70 STRL (DRAPES) ×2 IMPLANT
SLEEVE SCD COMPRESS KNEE MED (MISCELLANEOUS) ×2 IMPLANT
SLING ARM IMMOBILIZER LRG (SOFTGOODS) IMPLANT
SLING ARM IMMOBILIZER MED (SOFTGOODS) IMPLANT
SLING ARM LRG ADULT FOAM STRAP (SOFTGOODS) IMPLANT
SLING ARM MED ADULT FOAM STRAP (SOFTGOODS) ×1 IMPLANT
SLING ARM XL FOAM STRAP (SOFTGOODS) IMPLANT
SPONGE LAP 4X18 X RAY DECT (DISPOSABLE) ×2 IMPLANT
STRIP CLOSURE SKIN 1/2X4 (GAUZE/BANDAGES/DRESSINGS) ×2 IMPLANT
SUT FIBERWIRE #2 38 T-5 BLUE (SUTURE)
SUT MNCRL AB 4-0 PS2 18 (SUTURE) ×2 IMPLANT
SUT PDS AB 1 CT  36 (SUTURE)
SUT PDS AB 1 CT 36 (SUTURE) IMPLANT
SUT PROLENE 0 CT 1 CR/8 (SUTURE) IMPLANT
SUT TIGER TAPE 7 IN WHITE (SUTURE) IMPLANT
SUT VIC AB 3-0 SH 27 (SUTURE)
SUT VIC AB 3-0 SH 27X BRD (SUTURE) IMPLANT
SUT VICRYL 3-0 CR8 SH (SUTURE) ×2 IMPLANT
SUTURE FIBERWR #2 38 T-5 BLUE (SUTURE) IMPLANT
TAPE FIBER 2MM 7IN #2 BLUE (SUTURE) IMPLANT
TOWEL OR 17X24 6PK STRL BLUE (TOWEL DISPOSABLE) ×2 IMPLANT
TOWEL OR NON WOVEN STRL DISP B (DISPOSABLE) ×2 IMPLANT
TUBE CONNECTING 20X1/4 (TUBING) IMPLANT
WAND STAR VAC 90 (SURGICAL WAND) ×2 IMPLANT
WATER STERILE IRR 1000ML POUR (IV SOLUTION) ×2 IMPLANT

## 2014-04-13 NOTE — Anesthesia Procedure Notes (Addendum)
Anesthesia Regional Block:  Interscalene brachial plexus block  Pre-Anesthetic Checklist: ,, timeout performed, Correct Patient, Correct Site, Correct Laterality, Correct Procedure, Correct Position, site marked, Risks and benefits discussed,  Surgical consent,  Pre-op evaluation,  At surgeon's request and post-op pain management  Laterality: Right  Prep: chloraprep       Needles:  Injection technique: Single-shot  Needle Type: Echogenic Stimulator Needle     Needle Length: 5cm 5 cm Needle Gauge: 22 and 22 G    Additional Needles:  Procedures: ultrasound guided (picture in chart) and nerve stimulator Interscalene brachial plexus block  Nerve Stimulator or Paresthesia:  Response: bicep contraction, 0.45 mA,   Additional Responses:   Narrative:  Start time: 04/13/2014 7:58 AM End time: 04/13/2014 8:08 AM Injection made incrementally with aspirations every 5 mL.  Performed by: Personally  Anesthesiologist: J. Tamela Gammon, MD  Additional Notes: Functioning IV was confirmed and monitors applied.  A 61mm 22ga echogenic arrow stimulator was used. Sterile prep and drape,hand hygiene and sterile gloves were used.Ultrasound guidance: relevant anatomy identified, needle position confirmed, local anesthetic spread visualized around nerve(s)., vascular puncture avoided.  Image printed for medical record.  Negative aspiration and negative test dose prior to incremental administration of local anesthetic. The patient tolerated the procedure well.   Procedure Name: Intubation Date/Time: 04/13/2014 8:38 AM Performed by: Wanita Chamberlain Pre-anesthesia Checklist: Patient identified, Timeout performed, Emergency Drugs available and Suction available Patient Re-evaluated:Patient Re-evaluated prior to inductionOxygen Delivery Method: Circle system utilized Preoxygenation: Pre-oxygenation with 100% oxygen Intubation Type: IV induction Ventilation: Mask ventilation without difficulty Laryngoscope  Size: Mac and 3 Grade View: Grade I Tube type: Oral Tube size: 7.0 mm Number of attempts: 1 Airway Equipment and Method: Patient positioned with wedge pillow and Stylet Placement Confirmation: ETT inserted through vocal cords under direct vision,  positive ETCO2 and breath sounds checked- equal and bilateral Secured at: 20 cm Tube secured with: Tape Dental Injury: Teeth and Oropharynx as per pre-operative assessment

## 2014-04-13 NOTE — Op Note (Signed)
04/13/2014  9:32 AM  PATIENT:  Molly Peters    PRE-OPERATIVE DIAGNOSIS:  RIGHT SHOULDER DISORDER MILD TO MODERATE OSTEOARTHRITIS, POSSIBLE ADHESIVE CAPSULITIS  POST-OPERATIVE DIAGNOSIS:  Right shoulder mild to moderate osteoarthritis  PROCEDURE:  RIGHT SHOULDER ARTHROSCOPY WITH DEBRIDEMENT EXTENSIVE  SURGEON:  Johnny Bridge, MD  PHYSICIAN ASSISTANT: Joya Gaskins, OPA-C, present and scrubbed throughout the case, critical for completion in a timely fashion, and for retraction, instrumentation, and closure.  ANESTHESIA:   General  PREOPERATIVE INDICATIONS:  Molly Peters is a  53 y.o. female with a diagnosis of [place] who failed conservative measures and elected for surgical management.  We discussed the options of total shoulder replacement versus shoulder arthroscopy with extensive debridement, in an attempt to modify her symptoms through a minimally invasive approach.  The risks benefits and alternatives were discussed with the patient preoperatively including but not limited to the risks of infection, bleeding, nerve injury, cardiopulmonary complications, the need for revision surgery, the potential for future total shoulder replacement, none among others, and the patient was willing to proceed.  OPERATIVE IMPLANTS: None  OPERATIVE FINDINGS: There was extended chondral abnormalities within the glenoid articular surface, as well as on the humeral head. Small areas of grade 4 exposed bone on the glenoid. Extensive grade 3 changes on the humeral head. Extensive fraying of the labrum as well as the undersurface of the supraspinatus , although the biceps and supraspinatus, infraspinatus and subscapularis were all intact.  OPERATIVE PROCEDURE: The patient is brought to the operating room and placed in the supine position. General anesthesia was administered. IV antibiotics were given. She was placed in the semilateral decubitus position, all bony  prominences padded. Diagnostic arthroscopy was carried out with the above-named findings after sterile prep and drape and time out was performed. I used the arthroscopic shaver and actually needed the arthroscopic biter to debride a abnormal chondral flap that was off of the labrum posteriorly. I had portals to do this. I debrided all of the loose chondral debris, as well as the labrum circumferentially, and the undersurface of the supraspinatus. Once I had debrided all of the abnormal tissue back to stable configuration, I went to the subacromial space.  Bursectomy was performed, but there was really not all that much bursitis. The CA ligament was pristine. I did not perform an acromioplasty. There was a small tuft of frayed supraspinatus from above which I debrided, and this was not really structural.  I then removed the arthroscopic instruments, and closed the portals with Monocryl and sterile gauze. She was awakened and returned to the PACU in stable and satisfactory condition. There were no complications.

## 2014-04-13 NOTE — Anesthesia Preprocedure Evaluation (Addendum)
Anesthesia Evaluation  Patient identified by MRN, date of birth, ID band Patient awake    Reviewed: Allergy & Precautions, H&P , NPO status , Patient's Chart, lab work & pertinent test results  Airway Mallampati: II  Neck ROM: Limited  Mouth opening: Limited Mouth Opening  Dental  (+) Edentulous Lower, Poor Dentition, Dental Advidsory Given, Implants   Pulmonary COPDCurrent Smoker,  breath sounds clear to auscultation        Cardiovascular Exercise Tolerance: Good hypertension, Pt. on medications Rhythm:regular Rate:Normal  10-09-10  - Left ventricle: The cavity size was normal. Systolic function was    normal. Wall motion was normal; there were no regional wall motion    abnormalities. Doppler parameters are consistent with abnormal    left ventricular relaxation (grade 1 diastolic dysfunction).  - Aortic valve: Mild regurgitation. In select images, regurgitation    appears to be mild to moderate. Valve area: 2.12cm^2(VTI). Valve    area: 2.09cm^2 (Vmax).  - Mitral valve: Mild regurgitation.  - Left atrium: The atrium was normal in size.  - Right ventricle: Systolic function was normal.  - Tricuspid valve: Mild regurgitation.  - Pulmonary arteries: Systolic pressure was within the normal range.   Neuro/Psych  Headaches, Seizures -, Well Controlled,  PSYCHIATRIC DISORDERS Anxiety Depression  Neuromuscular disease negative psych ROS   GI/Hepatic Neg liver ROS, PUD, GERD-  Medicated and Controlled,  Endo/Other  negative endocrine ROS  Renal/GU negative Renal ROS     Musculoskeletal  (+) Fibromyalgia -, narcotic dependent  Abdominal   Peds  Hematology   Anesthesia Other Findings Implants lower  Reproductive/Obstetrics negative OB ROS                      Anesthesia Physical Anesthesia Plan  ASA: III  Anesthesia Plan: General   Post-op Pain Management: MAC Combined w/ Regional for Post-op pain    Induction: Intravenous  Airway Management Planned: Oral ETT  Additional Equipment:   Intra-op Plan:   Post-operative Plan:   Informed Consent: I have reviewed the patients History and Physical, chart, labs and discussed the procedure including the risks, benefits and alternatives for the proposed anesthesia with the patient or authorized representative who has indicated his/her understanding and acceptance.   Dental Advisory Given  Plan Discussed with: CRNA, Anesthesiologist and Surgeon  Anesthesia Plan Comments:       Anesthesia Quick Evaluation

## 2014-04-13 NOTE — Addendum Note (Signed)
Addendum created 04/13/14 1108 by Tawni Millers, CRNA   Modules edited: Charges VN

## 2014-04-13 NOTE — H&P (Signed)
PREOPERATIVE H&P  Chief Complaint: RIGHT SHOULDER DISORDER ARTICULAR CARTILAGE,ADHESIVE CAPSULITIS FROZEN SHOULDER  HPI: Molly Peters is a 53 y.o. female who presents for preoperative history and physical with a diagnosis of RIGHT SHOULDER DISORDER ARTICULAR CARTILAGE,ADHESIVE CAPSULITIS FROZEN SHOULDER. Symptoms are rated as moderate to severe, and have been worsening.  This is significantly impairing activities of daily living.  She has elected for surgical management. She has failed injections, activity modification, narcotic medication, and continues to have stiffness and severe pain.  Past Medical History  Diagnosis Date  . COPD (chronic obstructive pulmonary disease)   . Depression   . GERD (gastroesophageal reflux disease)   . Anxiety   . Glaucoma   . Arthritis   . Hypertension   . Seizure disorder   . Valvular heart disease     Initial workup a number of years ago at [facility].  Echo (10/2009) showed EF 60-65%,      normal LV size, moderate aortic insufficiency with a trileaflet aortic valve and normal aortic root size.  Mild      mitral regurgitation.   . Hemorrhoids     internal and external  . Fibromyalgia   . Anal fissure   . Chronic headaches   . IBS (irritable bowel syndrome)   . Chest pain   . Seizures     2003-last seizure   Past Surgical History  Procedure Laterality Date  . US echocardiography  1995  . Cardiac catheterization    . Abdominal hysterectomy    . Breast biopsy  1989    several  . Anterior fusion lumbar spine      L-S fusion with cage - [place] 07/03  . Esophagogastroduodenoscopy  09/28/2008    GERD, Gastritis, Candidiasis  . Lumbar laminectomy/decompression microdiscectomy      cyst removal 01/08  . Lumbar spine surgery      cyst removed/rod inserted 1/11    Dr Christella Noa  . Nm myoview ltd      Lexiscan myoview (10/2009): EF 77%, normal wall motion, normal perfusion.   Marland Kitchen Anterior cervical decomp/discectomy fusion  4/12    Dr Donley Redder;;  .  Colonoscopy  09/28/2008    internal and external hemorrhoids   History   Social History  . Marital Status: Married    Spouse Name: N/A    Number of Children: 3  . Years of Education: N/A   Occupational History  . Disabled 2003    Social History Main Topics  . Smoking status: Current Every Day Smoker -- 1.00 packs/day for 38 years    Types: Cigarettes  . Smokeless tobacco: Never Used     Comment: 3/4 PPD and some vaping  . Alcohol Use: No  . Drug Use: Yes    Special: Marijuana  . Sexual Activity: None   Other Topics Concern  . None   Social History Narrative       Family History  Problem Relation Age of Onset  . Arthritis Mother   . Cervical cancer Mother   . Healthy Father   . Cancer Father     colon cancer  . Migraines Sister   . Cervical cancer Sister   . Migraines Brother   . Asthma Daughter   . Coronary artery disease Maternal Grandmother   . Hypertension Maternal Grandmother   . Diabetes Maternal Grandmother   . Coronary artery disease Paternal Grandmother   . Hypertension Paternal Grandmother   . Diabetes Paternal Grandmother    Allergies  Allergen Reactions  .  Carbamazepine     REACTION: unspecified  . Clonazepam     REACTION: unspecified  . Diphenhydramine Hcl     REACTION: unspecified  . Divalproex Sodium     REACTION: unspecified  . Tiotropium Bromide Monohydrate     REACTION: cough and throat problems   Prior to Admission medications   Medication Sig Start Date End Date Taking? Authorizing Provider  HYDROcodone-acetaminophen (NORCO) 10-325 MG per tablet Take 1 tablet by mouth every 6 (six) hours as needed.   Yes Historical Provider, MD  ALPRAZolam Duanne Moron) 1 MG tablet Take 1 tablet (1 mg total) by mouth 3 (three) times daily as needed for anxiety.    Venia Carbon, MD  gabapentin (NEURONTIN) 300 MG capsule TAKE 1 CAPSULE TWICE DAILY  AND TAKE 2 CAPSULES AT BEDTIME  (INCREASE AS DIRECTED) 02/13/14   Venia Carbon, MD   HYDROcodone-acetaminophen [facility]) 10-325 MG per tablet Take 1 tablet by mouth every 4 (four) hours as needed.  03/14/14   Historical Provider, MD  lamoTRIgine (LAMICTAL) 100 MG tablet TAKE 1 TABLET TWICE DAILY 12/18/13   Venia Carbon, MD  loratadine (CLARITIN) 10 MG tablet Take 10-20 mg by mouth daily as needed for allergies.    Historical Provider, MD  losartan-hydrochlorothiazide (HYZAAR) 100-12.5 MG per tablet TAKE 1 TABLET EVERY DAY 12/18/13   Venia Carbon, MD  omeprazole (PRILOSEC) 20 MG capsule Take 1 capsule (20 mg total) by mouth daily. 04/05/13   Venia Carbon, MD  PARoxetine (PAXIL) 20 MG tablet TAKE 1 TABLET EVERY MORNING 12/18/13   Venia Carbon, MD  potassium chloride SA (K-DUR,KLOR-CON) 20 MEQ tablet Take 1 tablet (20 mEq total) by mouth 2 (two) times daily. 04/17/13   Venia Carbon, MD  simvastatin (ZOCOR) 20 MG tablet Take 1 tablet (20 mg total) by mouth at bedtime. 04/05/13   Venia Carbon, MD  tiZANidine (ZANAFLEX) 4 MG tablet Take 1 tablet (4 mg total) by mouth Nightly. 03/07/14   Owens Loffler, MD     Positive ROS: All other systems have been reviewed and were otherwise negative with the exception of those mentioned in the HPI and as above.  Physical Exam: General: Alert, no acute distress Cardiovascular: No pedal edema Respiratory: No cyanosis, no use of accessory musculature GI: No organomegaly, abdomen is soft and non-tender Skin: No lesions in the area of chief complaint Neurologic: Sensation intact distally Psychiatric: Patient is competent for consent with normal mood and affect Lymphatic: No axillary or cervical lymphadenopathy  MUSCULOSKELETAL: Right shoulder active motion is 0 to about 85. Rotator cuff strength is fair.  Assessment: RIGHT SHOULDER DISORDER ARTICULAR CARTILAGE,ADHESIVE CAPSULITIS FROZEN SHOULDER, mild to moderate degenerative changes in the glenohumeral joint.  Plan: Plan for Procedure(s): RIGHT SHOULDER ARTHROSCOPY WITH  DEBRIDEMENT EXTENTSIVE,MANIPULATION SHOULDER UNDER ANESTHESIA  The risks benefits and alternatives were discussed with the patient including but not limited to the risks of nonoperative treatment, versus surgical intervention including infection, bleeding, nerve injury,  blood clots, cardiopulmonary complications, morbidity, mortality, among others, and they were willing to proceed. We have also discussed the risks for incomplete relief of symptoms, persistent stiffness, the potential for future arthroplasty, among others.  Johnny Bridge, MD Cell [phone]   04/13/2014 7:20 AM

## 2014-04-13 NOTE — Transfer of Care (Signed)
Immediate Anesthesia Transfer of Care Note  Patient: Molly Peters  Procedure(s) Performed: Procedure(s): RIGHT SHOULDER ARTHROSCOPY WITH DEBRIDEMENT EXTENSIVE (Right)  Patient Location: PACU  Anesthesia Type:General  Level of Consciousness: awake, alert , oriented and patient cooperative  Airway & Oxygen Therapy: Patient Spontanous Breathing and Patient connected to face mask oxygen  Post-op Assessment: Report given to PACU RN and Post -op Vital signs reviewed and stable  Post vital signs: Reviewed and stable  Complications: No apparent anesthesia complications

## 2014-04-13 NOTE — Anesthesia Postprocedure Evaluation (Signed)
Anesthesia Post Note  Patient: Molly Peters  Procedure(s) Performed: Procedure(s) (LRB): RIGHT SHOULDER ARTHROSCOPY WITH DEBRIDEMENT EXTENSIVE (Right)  Anesthesia type: general  Patient location: PACU  Post pain: Pain level controlled  Post assessment: Patient's Cardiovascular Status Stable  Last Vitals:  Filed Vitals:   04/13/14 1053  BP:   Pulse: 68  Temp:   Resp: 13    Post vital signs: Reviewed and stable  Level of consciousness: sedated  Complications: No apparent anesthesia complications

## 2014-04-13 NOTE — Discharge Instructions (Signed)
Diet: As you were doing prior to hospitalization   Shower:  May shower but keep the wounds dry, use an occlusive plastic wrap, NO SOAKING IN TUB.  If the bandage gets wet, change with a clean dry gauze.  Dressing:  You may change your dressing 3-5 days after surgery.  Then change the dressing daily with sterile gauze dressing.    There are sticky tapes (steri-strips) on your wounds and all the stitches are absorbable.  Leave the steri-strips in place when changing your dressings, they will peel off with time, usually 2-3 weeks.  Activity:  Increase activity slowly as tolerated, but follow the weight bearing instructions below.  No lifting or driving for 6 weeks.  Weight Bearing:   As tolerated.    To prevent constipation: you may use a stool softener such as -  Colace (over the counter) 100 mg by mouth twice a day  Drink plenty of fluids (prune juice may be helpful) and high fiber foods Miralax (over the counter) for constipation as needed.    Itching:  If you experience itching with your medications, try taking only a single pain pill, or even half a pain pill at a time.  You may take up to 10 pain pills per day, and you can also use benadryl over the counter for itching or also to help with sleep.   Precautions:  If you experience chest pain or shortness of breath - call 911 immediately for transfer to the hospital emergency department!!  If you develop a fever greater that 101 F, purulent drainage from wound, increased redness or drainage from wound, or calf pain -- Call the office at [phone]                                                Follow- Up Appointment:  Please call for an appointment to be seen in 2 weeks [place] - [phone]     Regional Anesthesia Blocks  1. Numbness or the inability to move the "blocked" extremity may last from 3-48 hours after placement. The length of time depends on the medication injected and your individual response to the medication. If  the numbness is not going away after 48 hours, call your surgeon.  2. The extremity that is blocked will need to be protected until the numbness is gone and the  Strength has returned. Because you cannot feel it, you will need to take extra care to avoid injury. Because it may be weak, you may have difficulty moving it or using it. You may not know what position it is in without looking at it while the block is in effect.  3. For blocks in the legs and feet, returning to weight bearing and walking needs to be done carefully. You will need to wait until the numbness is entirely gone and the strength has returned. You should be able to move your leg and foot normally before you try and bear weight or walk. You will need someone to be with you when you first try to ensure you do not fall and possibly risk injury.  4. Bruising and tenderness at the needle site are common side effects and will resolve in a few days.  5. Persistent numbness or new problems with movement should be communicated to the surgeon or the [facility] [phone] [place] (  [phone]).    Post Anesthesia Home Care Instructions  Activity: Get plenty of rest for the remainder of the day. A responsible adult should stay with you for 24 hours following the procedure.  For the next 24 hours, DO NOT: -Drive a car -Paediatric nurse -Drink alcoholic beverages -Take any medication unless instructed by your physician -Make any legal decisions or sign important papers.  Meals: Start with liquid foods such as gelatin or soup. Progress to regular foods as tolerated. Avoid greasy, spicy, heavy foods. If nausea and/or vomiting occur, drink only clear liquids until the nausea and/or vomiting subsides. Call your physician if vomiting continues.  Special Instructions/Symptoms: Your throat may feel dry or sore from the anesthesia or the breathing tube placed in your throat during surgery. If this causes  discomfort, gargle with warm salt water. The discomfort should disappear within 24 hours.

## 2014-04-13 NOTE — Progress Notes (Signed)
Assisted Dr. Singer with right, ultrasound guided, interscalene  block. Side rails up, monitors on throughout procedure. See vital signs in flow sheet. Tolerated Procedure well. 

## 2014-04-16 ENCOUNTER — Encounter (HOSPITAL_BASED_OUTPATIENT_CLINIC_OR_DEPARTMENT_OTHER): Payer: Self-pay | Admitting: Orthopedic Surgery

## 2014-04-17 ENCOUNTER — Other Ambulatory Visit: Payer: Self-pay | Admitting: *Deleted

## 2014-04-17 MED ORDER — ALPRAZOLAM 1 MG PO TABS: 1.0000 mg | ORAL_TABLET | Freq: Three times a day (TID) | ORAL | Status: DC | PRN

## 2014-04-17 NOTE — Telephone Encounter (Signed)
Electronic refill request. Last Filled:   90 tablet 0 RF on  03/01/14.  Please advise.

## 2014-04-17 NOTE — Telephone Encounter (Signed)
Okay #90 x 0 

## 2014-04-17 NOTE — Telephone Encounter (Signed)
rx called into pharmacy

## 2014-04-19 ENCOUNTER — Other Ambulatory Visit: Payer: Self-pay | Admitting: Internal Medicine

## 2014-05-14 ENCOUNTER — Other Ambulatory Visit: Payer: Self-pay | Admitting: Internal Medicine

## 2014-05-28 ENCOUNTER — Other Ambulatory Visit: Payer: Self-pay | Admitting: *Deleted

## 2014-05-28 MED ORDER — ALPRAZOLAM 1 MG PO TABS: 1.0000 mg | ORAL_TABLET | Freq: Three times a day (TID) | ORAL | Status: DC | PRN

## 2014-05-28 NOTE — Telephone Encounter (Signed)
04/17/14 

## 2014-05-28 NOTE — Telephone Encounter (Signed)
Okay #90 x 0 

## 2014-05-28 NOTE — Telephone Encounter (Signed)
rx called into pharmacy

## 2014-07-02 ENCOUNTER — Other Ambulatory Visit: Payer: Self-pay | Admitting: *Deleted

## 2014-07-02 MED ORDER — SIMVASTATIN 20 MG PO TABS: 20.0000 mg | ORAL_TABLET | Freq: Every day | ORAL | Status: DC

## 2014-07-09 ENCOUNTER — Other Ambulatory Visit: Payer: Self-pay | Admitting: *Deleted

## 2014-07-09 NOTE — Telephone Encounter (Signed)
Okay #90 x 0 

## 2014-07-09 NOTE — Telephone Encounter (Signed)
#  90x0 was refilled on 05/28/14. Last OV was a pre-op exam on 04/04/14, no future appts scheduled.

## 2014-07-10 MED ORDER — ALPRAZOLAM 1 MG PO TABS: 1.0000 mg | ORAL_TABLET | Freq: Three times a day (TID) | ORAL | Status: DC | PRN

## 2014-07-10 NOTE — Telephone Encounter (Signed)
rx called into pharmacy

## 2014-08-03 ENCOUNTER — Encounter: Payer: Self-pay | Admitting: Internal Medicine

## 2014-08-09 ENCOUNTER — Other Ambulatory Visit: Payer: Self-pay | Admitting: Internal Medicine

## 2014-08-09 NOTE — Telephone Encounter (Signed)
Last office visit 04/04/2014.  Last refilled 05/14/2014 for #360 with no refills.  Ok to refill?

## 2014-08-09 NOTE — Telephone Encounter (Signed)
Okay to refill #360 x 3 Take out the "increase as directed part"

## 2014-08-17 ENCOUNTER — Other Ambulatory Visit: Payer: Self-pay | Admitting: *Deleted

## 2014-08-17 MED ORDER — ALPRAZOLAM 1 MG PO TABS: 1.0000 mg | ORAL_TABLET | Freq: Three times a day (TID) | ORAL | Status: DC | PRN

## 2014-08-17 NOTE — Telephone Encounter (Signed)
#  90 last filled on 07/10/14. Her last ov was a pre op exam in 5/15. No future appt scheduled.

## 2014-08-17 NOTE — Telephone Encounter (Signed)
Okay #90 x 0 

## 2014-08-17 NOTE — Telephone Encounter (Signed)
[facility]. Spoke to [employer]

## 2014-09-05 NOTE — Progress Notes (Signed)
Reviewed orders--not sure what happened Had 2014 mammo and follow up which was okay I recommended repeat in 2016

## 2014-09-12 ENCOUNTER — Telehealth: Payer: Self-pay

## 2014-09-12 NOTE — Telephone Encounter (Signed)
Pre-op clearance for pt to proceed w/ rt total shoulder replacement faxed to [employer].   Per Christell Faith, PA, "Last [facility], associated w/ CP, no ischemia".

## 2014-09-18 ENCOUNTER — Telehealth: Payer: Self-pay | Admitting: Internal Medicine

## 2014-09-18 NOTE — Telephone Encounter (Signed)
Sherri at [employer] sent a clearance for surgery to Dr.Letvak.  Dr.Letvak said patient didn't need a clearance from him, she needed an  appointment with Dr.Gollan to be cleared for surgery.  I tried several time to contact patient.  She only has 1 phone number and it's turned off and it goes directly to voice mail.  I've left her 2 messages to call and schedule appointment with no response.  I called Sherri and told her I can't reach patient and Dr.Gollan would need to clear patient for surgery.  Sherri said she sent a clearance to Dr.Gollan,also. I told Avie Echevaria said if Dr.Gollan clears patient for surgery she didn't need a clearance from him.  Sherri said she wanted it in writing that it's ok for her just to get a clearance from [place].  I told her I'd send the clearance form back to [facility], so he can document that and fax it back to her.

## 2014-09-18 NOTE — Telephone Encounter (Signed)
Form done 

## 2014-09-19 NOTE — Telephone Encounter (Signed)
Form faxed to [facility].

## 2014-09-27 ENCOUNTER — Ambulatory Visit: Payer: Self-pay | Admitting: Orthopedic Surgery

## 2014-10-01 ENCOUNTER — Other Ambulatory Visit: Payer: Self-pay | Admitting: *Deleted

## 2014-10-01 NOTE — Telephone Encounter (Signed)
08/17/14 

## 2014-10-02 MED ORDER — ALPRAZOLAM 1 MG PO TABS: 1.0000 mg | ORAL_TABLET | Freq: Three times a day (TID) | ORAL | Status: DC | PRN

## 2014-10-02 NOTE — Telephone Encounter (Signed)
Approved: #90 x 0 

## 2014-10-02 NOTE — Telephone Encounter (Signed)
Medication phoned to pharmacy.  

## 2014-10-17 ENCOUNTER — Other Ambulatory Visit: Payer: Self-pay | Admitting: Internal Medicine

## 2014-10-17 NOTE — Telephone Encounter (Signed)
Ok to fill 

## 2014-10-17 NOTE — Telephone Encounter (Signed)
Approved: okay to refill for a year 

## 2014-11-08 ENCOUNTER — Other Ambulatory Visit: Payer: Self-pay | Admitting: *Deleted

## 2014-11-08 MED ORDER — ALPRAZOLAM 1 MG PO TABS: 1.0000 mg | ORAL_TABLET | Freq: Three times a day (TID) | ORAL | Status: DC | PRN

## 2014-11-08 NOTE — Telephone Encounter (Signed)
Ok to phone in xanax 

## 2014-11-08 NOTE — Telephone Encounter (Signed)
Rx called in to pharmacy. 

## 2014-11-08 NOTE — Telephone Encounter (Signed)
Last filled 10/02/14, last appt was a surgical clearance on 04/04/14. No future appts scheduled.

## 2014-11-09 DIAGNOSIS — M19011 Primary osteoarthritis, right shoulder: Secondary | ICD-10-CM

## 2014-11-09 HISTORY — DX: Primary osteoarthritis, right shoulder: M19.011

## 2014-11-15 ENCOUNTER — Encounter (HOSPITAL_BASED_OUTPATIENT_CLINIC_OR_DEPARTMENT_OTHER)
Admission: RE | Admit: 2014-11-15 | Discharge: 2014-11-15 | Disposition: A | Discharge status: Home / Self Care | Payer: Medicare PPO | Source: Ambulatory Visit | Attending: Orthopedic Surgery | Admitting: Orthopedic Surgery

## 2014-11-15 ENCOUNTER — Encounter (HOSPITAL_BASED_OUTPATIENT_CLINIC_OR_DEPARTMENT_OTHER): Payer: Self-pay | Admitting: *Deleted

## 2014-11-15 ENCOUNTER — Encounter (HOSPITAL_BASED_OUTPATIENT_CLINIC_OR_DEPARTMENT_OTHER): Payer: Self-pay

## 2014-11-15 DIAGNOSIS — F419 Anxiety disorder, unspecified: Secondary | ICD-10-CM | POA: Diagnosis not present

## 2014-11-15 DIAGNOSIS — M19011 Primary osteoarthritis, right shoulder: Secondary | ICD-10-CM | POA: Diagnosis not present

## 2014-11-15 DIAGNOSIS — F329 Major depressive disorder, single episode, unspecified: Secondary | ICD-10-CM | POA: Diagnosis not present

## 2014-11-15 DIAGNOSIS — I10 Essential (primary) hypertension: Secondary | ICD-10-CM | POA: Diagnosis not present

## 2014-11-15 DIAGNOSIS — Z79899 Other long term (current) drug therapy: Secondary | ICD-10-CM | POA: Diagnosis not present

## 2014-11-15 DIAGNOSIS — M797 Fibromyalgia: Secondary | ICD-10-CM | POA: Diagnosis not present

## 2014-11-15 DIAGNOSIS — K589 Irritable bowel syndrome without diarrhea: Secondary | ICD-10-CM | POA: Diagnosis not present

## 2014-11-15 DIAGNOSIS — K219 Gastro-esophageal reflux disease without esophagitis: Secondary | ICD-10-CM | POA: Diagnosis not present

## 2014-11-15 DIAGNOSIS — J449 Chronic obstructive pulmonary disease, unspecified: Secondary | ICD-10-CM | POA: Diagnosis not present

## 2014-11-15 DIAGNOSIS — Z8711 Personal history of peptic ulcer disease: Secondary | ICD-10-CM | POA: Diagnosis not present

## 2014-11-15 DIAGNOSIS — Z8249 Family history of ischemic heart disease and other diseases of the circulatory system: Secondary | ICD-10-CM | POA: Diagnosis not present

## 2014-11-15 DIAGNOSIS — F17210 Nicotine dependence, cigarettes, uncomplicated: Secondary | ICD-10-CM | POA: Diagnosis not present

## 2014-11-15 HISTORY — DX: Other amnesia: R41.3

## 2014-11-15 LAB — BASIC METABOLIC PANEL
Anion gap: 10 (ref 5–15)
BUN: 10 mg/dL (ref 6–23)
CO2: 27 mmol/L (ref 19–32)
CREATININE: 0.84 mg/dL (ref 0.50–1.10)
Calcium: 9.9 mg/dL (ref 8.4–10.5)
Chloride: 101 mEq/L (ref 96–112)
GFR, EST AFRICAN AMERICAN: 90 mL/min — AB (ref >90)
GFR, EST NON AFRICAN AMERICAN: 77 mL/min — AB (ref >90)
Glucose, Bld: 80 mg/dL (ref 70–99)
Potassium: 3.8 mmol/L (ref 3.5–5.1)
SODIUM: 138 mmol/L (ref 135–145)

## 2014-11-15 LAB — CBC
HEMATOCRIT: 37.2 % (ref 36.0–46.0)
Hemoglobin: 12.7 g/dL (ref 12.0–15.0)
MCH: 29 pg (ref 26.0–34.0)
MCHC: 34.1 g/dL (ref 30.0–36.0)
MCV: 84.9 fL (ref 78.0–100.0)
PLATELETS: 450 10*3/uL — AB (ref 150–400)
RBC: 4.38 MIL/uL (ref 3.87–5.11)
RDW: 14.9 % (ref 11.5–15.5)
WBC: 9.3 10*3/uL (ref 4.0–10.5)

## 2014-11-15 LAB — SURGICAL PCR SCREEN
MRSA, PCR: NEGATIVE
STAPHYLOCOCCUS AUREUS: POSITIVE — AB

## 2014-11-23 ENCOUNTER — Ambulatory Visit (HOSPITAL_BASED_OUTPATIENT_CLINIC_OR_DEPARTMENT_OTHER): Payer: Medicare PPO | Admitting: Anesthesiology

## 2014-11-23 ENCOUNTER — Encounter (HOSPITAL_BASED_OUTPATIENT_CLINIC_OR_DEPARTMENT_OTHER): Payer: Self-pay | Admitting: *Deleted

## 2014-11-23 ENCOUNTER — Ambulatory Visit (HOSPITAL_BASED_OUTPATIENT_CLINIC_OR_DEPARTMENT_OTHER)
Admission: RE | Admit: 2014-11-23 | Discharge: 2014-11-24 | Disposition: A | Discharge status: Home / Self Care | Payer: Medicare PPO | Source: Ambulatory Visit | Attending: Orthopedic Surgery | Admitting: Orthopedic Surgery

## 2014-11-23 ENCOUNTER — Encounter (HOSPITAL_BASED_OUTPATIENT_CLINIC_OR_DEPARTMENT_OTHER)
Admission: RE | Disposition: A | Discharge status: Home / Self Care | Payer: Self-pay | Source: Ambulatory Visit | Attending: Orthopedic Surgery

## 2014-11-23 ENCOUNTER — Ambulatory Visit (HOSPITAL_COMMUNITY): Payer: Medicare PPO

## 2014-11-23 DIAGNOSIS — K219 Gastro-esophageal reflux disease without esophagitis: Secondary | ICD-10-CM | POA: Diagnosis not present

## 2014-11-23 DIAGNOSIS — I10 Essential (primary) hypertension: Secondary | ICD-10-CM | POA: Insufficient documentation

## 2014-11-23 DIAGNOSIS — Z96611 Presence of right artificial shoulder joint: Secondary | ICD-10-CM

## 2014-11-23 DIAGNOSIS — F329 Major depressive disorder, single episode, unspecified: Secondary | ICD-10-CM | POA: Insufficient documentation

## 2014-11-23 DIAGNOSIS — F17210 Nicotine dependence, cigarettes, uncomplicated: Secondary | ICD-10-CM | POA: Insufficient documentation

## 2014-11-23 DIAGNOSIS — Z8711 Personal history of peptic ulcer disease: Secondary | ICD-10-CM | POA: Insufficient documentation

## 2014-11-23 DIAGNOSIS — Z79899 Other long term (current) drug therapy: Secondary | ICD-10-CM | POA: Insufficient documentation

## 2014-11-23 DIAGNOSIS — M797 Fibromyalgia: Secondary | ICD-10-CM | POA: Diagnosis not present

## 2014-11-23 DIAGNOSIS — Z96619 Presence of unspecified artificial shoulder joint: Secondary | ICD-10-CM

## 2014-11-23 DIAGNOSIS — F419 Anxiety disorder, unspecified: Secondary | ICD-10-CM | POA: Insufficient documentation

## 2014-11-23 DIAGNOSIS — K589 Irritable bowel syndrome without diarrhea: Secondary | ICD-10-CM | POA: Insufficient documentation

## 2014-11-23 DIAGNOSIS — J449 Chronic obstructive pulmonary disease, unspecified: Secondary | ICD-10-CM | POA: Insufficient documentation

## 2014-11-23 DIAGNOSIS — M19011 Primary osteoarthritis, right shoulder: Secondary | ICD-10-CM | POA: Diagnosis not present

## 2014-11-23 DIAGNOSIS — Z8249 Family history of ischemic heart disease and other diseases of the circulatory system: Secondary | ICD-10-CM | POA: Insufficient documentation

## 2014-11-23 HISTORY — DX: Personal history of other diseases of the digestive system: Z87.19

## 2014-11-23 HISTORY — DX: Dysphagia, unspecified: R13.10

## 2014-11-23 HISTORY — DX: Family history of other specified conditions: Z84.89

## 2014-11-23 HISTORY — DX: Primary osteoarthritis, right shoulder: M19.011

## 2014-11-23 HISTORY — DX: Stiffness of unspecified joint, not elsewhere classified: M25.60

## 2014-11-23 HISTORY — DX: Other hemorrhoids: K64.8

## 2014-11-23 HISTORY — DX: Arthralgia of temporomandibular joint, unspecified side: M26.629

## 2014-11-23 HISTORY — DX: Presence of dental prosthetic device (complete) (partial): Z97.2

## 2014-11-23 HISTORY — DX: Personal history of peptic ulcer disease: Z87.11

## 2014-11-23 HISTORY — DX: Other specified symptoms and signs involving the digestive system and abdomen: R19.8

## 2014-11-23 HISTORY — PX: TOTAL SHOULDER ARTHROPLASTY: SHX126

## 2014-11-23 LAB — POCT HEMOGLOBIN-HEMACUE: Hemoglobin: 14 g/dL (ref 12.0–15.0)

## 2014-11-23 SURGERY — ARTHROPLASTY, SHOULDER, TOTAL
Anesthesia: General | Site: Shoulder | Laterality: Right

## 2014-11-23 MED ORDER — FENTANYL CITRATE 0.05 MG/ML IJ SOLN
50.0000 ug | INTRAMUSCULAR | Status: DC | PRN
  Administered 2014-11-23: 50 ug via INTRAVENOUS

## 2014-11-23 MED ORDER — OXYCODONE HCL 5 MG PO TABS
5.0000 mg | ORAL_TABLET | ORAL | Status: DC | PRN
  Administered 2014-11-23 – 2014-11-24 (×5): 10 mg via ORAL
  Filled 2014-11-23 (×5): qty 2

## 2014-11-23 MED ORDER — HYDROMORPHONE HCL 1 MG/ML IJ SOLN: 0.5000 mg | INTRAMUSCULAR | Status: DC | PRN

## 2014-11-23 MED ORDER — HYDROMORPHONE HCL 2 MG PO TABS: 2.0000 mg | ORAL_TABLET | ORAL | Status: DC | PRN

## 2014-11-23 MED ORDER — METOCLOPRAMIDE HCL 5 MG/ML IJ SOLN: 5.0000 mg | Freq: Three times a day (TID) | INTRAMUSCULAR | Status: DC | PRN

## 2014-11-23 MED ORDER — MIDAZOLAM HCL 2 MG/2ML IJ SOLN
INTRAMUSCULAR | Status: AC
  Filled 2014-11-23: qty 2

## 2014-11-23 MED ORDER — BUPIVACAINE-EPINEPHRINE (PF) 0.5% -1:200000 IJ SOLN
INTRAMUSCULAR | Status: DC | PRN
  Administered 2014-11-23: 20 mL via PERINEURAL

## 2014-11-23 MED ORDER — CEFAZOLIN SODIUM-DEXTROSE 2-3 GM-% IV SOLR
INTRAVENOUS | Status: AC
  Filled 2014-11-23: qty 50

## 2014-11-23 MED ORDER — HYDROMORPHONE HCL 1 MG/ML IJ SOLN
INTRAMUSCULAR | Status: AC
  Filled 2014-11-23: qty 1

## 2014-11-23 MED ORDER — PROMETHAZINE HCL 25 MG/ML IJ SOLN: 6.2500 mg | INTRAMUSCULAR | Status: DC | PRN

## 2014-11-23 MED ORDER — PROPOFOL 10 MG/ML IV BOLUS
INTRAVENOUS | Status: DC | PRN
  Administered 2014-11-23: 150 mg via INTRAVENOUS
  Administered 2014-11-23: 20 mg via INTRAVENOUS

## 2014-11-23 MED ORDER — VANCOMYCIN HCL IN DEXTROSE 1-5 GM/200ML-% IV SOLN
INTRAVENOUS | Status: AC
  Filled 2014-11-23: qty 200

## 2014-11-23 MED ORDER — LIDOCAINE HCL (CARDIAC) 10 MG/ML IV SOLN
INTRAVENOUS | Status: DC | PRN
  Administered 2014-11-23: 50 mg via INTRAVENOUS

## 2014-11-23 MED ORDER — HYDROMORPHONE HCL 1 MG/ML IJ SOLN
0.2500 mg | INTRAMUSCULAR | Status: DC | PRN
  Administered 2014-11-23 (×3): 0.5 mg via INTRAVENOUS

## 2014-11-23 MED ORDER — TIZANIDINE HCL 4 MG PO TABS: 4.0000 mg | ORAL_TABLET | Freq: Every evening | ORAL | Status: DC

## 2014-11-23 MED ORDER — SUCCINYLCHOLINE CHLORIDE 20 MG/ML IJ SOLN
INTRAMUSCULAR | Status: DC | PRN
  Administered 2014-11-23: 100 mg via INTRAVENOUS

## 2014-11-23 MED ORDER — ONDANSETRON HCL 4 MG PO TABS: 4.0000 mg | ORAL_TABLET | Freq: Four times a day (QID) | ORAL | Status: DC | PRN

## 2014-11-23 MED ORDER — CEFAZOLIN SODIUM 1-5 GM-% IV SOLN
1.0000 g | Freq: Four times a day (QID) | INTRAVENOUS | Status: AC
  Administered 2014-11-23 – 2014-11-24 (×3): 1 g via INTRAVENOUS
  Filled 2014-11-23 (×2): qty 50

## 2014-11-23 MED ORDER — FENTANYL CITRATE 0.05 MG/ML IJ SOLN
INTRAMUSCULAR | Status: AC
  Filled 2014-11-23: qty 8

## 2014-11-23 MED ORDER — CYCLOBENZAPRINE HCL 10 MG PO TABS
10.0000 mg | ORAL_TABLET | Freq: Every day | ORAL | Status: DC
  Administered 2014-11-23: 10 mg via ORAL

## 2014-11-23 MED ORDER — LACTATED RINGERS IV SOLN
INTRAVENOUS | Status: DC
  Administered 2014-11-23 (×3): via INTRAVENOUS

## 2014-11-23 MED ORDER — METOCLOPRAMIDE HCL 5 MG PO TABS: 5.0000 mg | ORAL_TABLET | Freq: Three times a day (TID) | ORAL | Status: DC | PRN

## 2014-11-23 MED ORDER — CEFAZOLIN SODIUM-DEXTROSE 2-3 GM-% IV SOLR
2.0000 g | INTRAVENOUS | Status: AC
  Administered 2014-11-23: 2 g via INTRAVENOUS

## 2014-11-23 MED ORDER — SENNA-DOCUSATE SODIUM 8.6-50 MG PO TABS: 2.0000 | ORAL_TABLET | Freq: Every day | ORAL | Status: DC

## 2014-11-23 MED ORDER — METHOCARBAMOL 500 MG PO TABS
500.0000 mg | ORAL_TABLET | Freq: Four times a day (QID) | ORAL | Status: DC | PRN
  Administered 2014-11-23 – 2014-11-24 (×2): 500 mg via ORAL
  Filled 2014-11-23 (×2): qty 1

## 2014-11-23 MED ORDER — ORPHENADRINE CITRATE ER 100 MG PO TB12: 100.0000 mg | ORAL_TABLET | Freq: Two times a day (BID) | ORAL | Status: DC | PRN

## 2014-11-23 MED ORDER — PHENYLEPHRINE HCL 10 MG/ML IJ SOLN
10.0000 mg | INTRAVENOUS | Status: DC | PRN
  Administered 2014-11-23: 25 ug/min via INTRAVENOUS

## 2014-11-23 MED ORDER — METHOCARBAMOL 1000 MG/10ML IJ SOLN: 500.0000 mg | Freq: Four times a day (QID) | INTRAVENOUS | Status: DC | PRN

## 2014-11-23 MED ORDER — OXYCODONE-ACETAMINOPHEN 10-325 MG PO TABS: 1.0000 | ORAL_TABLET | Freq: Four times a day (QID) | ORAL | Status: DC | PRN

## 2014-11-23 MED ORDER — DEXAMETHASONE SODIUM PHOSPHATE 4 MG/ML IJ SOLN
INTRAMUSCULAR | Status: DC | PRN
  Administered 2014-11-23: 10 mg via INTRAVENOUS

## 2014-11-23 MED ORDER — GABAPENTIN 400 MG PO CAPS
1200.0000 mg | ORAL_CAPSULE | Freq: Every day | ORAL | Status: DC
  Administered 2014-11-23: 1200 mg via ORAL

## 2014-11-23 MED ORDER — FENTANYL CITRATE 0.05 MG/ML IJ SOLN
INTRAMUSCULAR | Status: DC | PRN
  Administered 2014-11-23: 50 ug via INTRAVENOUS

## 2014-11-23 MED ORDER — LOSARTAN POTASSIUM-HCTZ 100-12.5 MG PO TABS: 1.0000 | ORAL_TABLET | Freq: Every day | ORAL | Status: DC

## 2014-11-23 MED ORDER — MAGNESIUM CITRATE PO SOLN: 1.0000 | Freq: Once | ORAL | Status: AC | PRN

## 2014-11-23 MED ORDER — LAMOTRIGINE 100 MG PO TABS
100.0000 mg | ORAL_TABLET | Freq: Two times a day (BID) | ORAL | Status: DC
  Administered 2014-11-23: 100 mg via ORAL

## 2014-11-23 MED ORDER — FENTANYL CITRATE 0.05 MG/ML IJ SOLN
INTRAMUSCULAR | Status: AC
Start: 2014-11-23 — End: 2014-11-23
  Filled 2014-11-23: qty 2

## 2014-11-23 MED ORDER — SODIUM CHLORIDE 0.9 % IV SOLN
INTRAVENOUS | Status: DC
  Administered 2014-11-23: 75 mL/h via INTRAVENOUS

## 2014-11-23 MED ORDER — SENNA 8.6 MG PO TABS: 1.0000 | ORAL_TABLET | Freq: Two times a day (BID) | ORAL | Status: DC

## 2014-11-23 MED ORDER — PAROXETINE HCL 20 MG PO TABS: 20.0000 mg | ORAL_TABLET | Freq: Every morning | ORAL | Status: DC

## 2014-11-23 MED ORDER — OXYCODONE-ACETAMINOPHEN 5-325 MG PO TABS
1.0000 | ORAL_TABLET | ORAL | Status: DC | PRN
  Administered 2014-11-24: 2 via ORAL
  Filled 2014-11-23: qty 2

## 2014-11-23 MED ORDER — VANCOMYCIN HCL IN DEXTROSE 1-5 GM/200ML-% IV SOLN
1000.0000 mg | Freq: Once | INTRAVENOUS | Status: AC
  Administered 2014-11-23: 1000 mg via INTRAVENOUS

## 2014-11-23 MED ORDER — BISACODYL 10 MG RE SUPP: 10.0000 mg | Freq: Every day | RECTAL | Status: DC | PRN

## 2014-11-23 MED ORDER — EPHEDRINE SULFATE 50 MG/ML IJ SOLN
INTRAMUSCULAR | Status: DC | PRN
  Administered 2014-11-23 (×2): 10 mg via INTRAVENOUS

## 2014-11-23 MED ORDER — SODIUM CHLORIDE 0.9 % IV SOLN
INTRAVENOUS | Status: DC | PRN
  Administered 2014-11-23: 1000 mL

## 2014-11-23 MED ORDER — ALBUTEROL SULFATE HFA 108 (90 BASE) MCG/ACT IN AERS
2.0000 | INHALATION_SPRAY | RESPIRATORY_TRACT | Status: DC
  Administered 2014-11-23: 2 via RESPIRATORY_TRACT

## 2014-11-23 MED ORDER — MIDAZOLAM HCL 2 MG/2ML IJ SOLN
1.0000 mg | INTRAMUSCULAR | Status: DC | PRN
  Administered 2014-11-23: 2 mg via INTRAVENOUS

## 2014-11-23 MED ORDER — ZOLPIDEM TARTRATE 5 MG PO TABS: 5.0000 mg | ORAL_TABLET | Freq: Every evening | ORAL | Status: DC | PRN

## 2014-11-23 MED ORDER — ONDANSETRON HCL 4 MG/2ML IJ SOLN: 4.0000 mg | Freq: Four times a day (QID) | INTRAMUSCULAR | Status: DC | PRN

## 2014-11-23 MED ORDER — ONDANSETRON HCL 4 MG/2ML IJ SOLN
INTRAMUSCULAR | Status: DC | PRN
  Administered 2014-11-23: 4 mg via INTRAVENOUS

## 2014-11-23 MED ORDER — POLYETHYLENE GLYCOL 3350 17 G PO PACK: 17.0000 g | PACK | Freq: Every day | ORAL | Status: DC | PRN

## 2014-11-23 MED ORDER — CEFAZOLIN SODIUM 1-5 GM-% IV SOLN
INTRAVENOUS | Status: AC
  Filled 2014-11-23: qty 50

## 2014-11-23 MED ORDER — PANTOPRAZOLE SODIUM 40 MG PO TBEC: 80.0000 mg | DELAYED_RELEASE_TABLET | Freq: Every day | ORAL | Status: DC

## 2014-11-23 MED ORDER — ONDANSETRON HCL 4 MG PO TABS: 4.0000 mg | ORAL_TABLET | Freq: Three times a day (TID) | ORAL | Status: DC | PRN

## 2014-11-23 MED ORDER — ALPRAZOLAM 1 MG PO TABS: 1.0000 mg | ORAL_TABLET | Freq: Three times a day (TID) | ORAL | Status: DC | PRN

## 2014-11-23 MED ORDER — DOCUSATE SODIUM 100 MG PO CAPS: 100.0000 mg | ORAL_CAPSULE | Freq: Two times a day (BID) | ORAL | Status: DC

## 2014-11-23 SURGICAL SUPPLY — 80 items
BLADE SAW SAG 29X58X.64 (BLADE) ×2 IMPLANT
BLADE SURG 10 STRL SS (BLADE) ×2 IMPLANT
BLADE SURG 15 STRL LF DISP TIS (BLADE) ×2 IMPLANT
BLADE SURG 15 STRL SS (BLADE) ×4
BOWL SMART MIX CTS (DISPOSABLE) ×1 IMPLANT
CANISTER SUCT 3000ML (MISCELLANEOUS) IMPLANT
CAPT SHLDR TOTAL 2 ×1 IMPLANT
CEMENT BONE DEPUY (Cement) ×2 IMPLANT
CLEANER CAUTERY TIP 5X5 PAD (MISCELLANEOUS) ×1 IMPLANT
CLSR STERI-STRIP ANTIMIC 1/2X4 (GAUZE/BANDAGES/DRESSINGS) ×2 IMPLANT
COVER BACK TABLE 60X90IN (DRAPES) ×2 IMPLANT
COVER MAYO STAND STRL (DRAPES) ×2 IMPLANT
COVER SURGICAL LIGHT HANDLE (MISCELLANEOUS) ×1 IMPLANT
DECANTER SPIKE VIAL GLASS SM (MISCELLANEOUS) IMPLANT
DRAPE INCISE IOBAN 66X45 STRL (DRAPES) ×2 IMPLANT
DRAPE SURG 17X23 STRL (DRAPES) ×2 IMPLANT
DRAPE U 20/CS (DRAPES) ×2 IMPLANT
DRAPE U-SHAPE 47X51 STRL (DRAPES) ×2 IMPLANT
DRAPE U-SHAPE 76X120 STRL (DRAPES) ×4 IMPLANT
DRSG MEPILEX BORDER 4X8 (GAUZE/BANDAGES/DRESSINGS) ×2 IMPLANT
DURAPREP 26ML APPLICATOR (WOUND CARE) ×2 IMPLANT
ELECT BLADE 6.5 .24CM SHAFT (ELECTRODE) IMPLANT
ELECT REM PT RETURN 9FT ADLT (ELECTROSURGICAL) ×2
ELECTRODE REM PT RTRN 9FT ADLT (ELECTROSURGICAL) ×1 IMPLANT
FACESHIELD WRAPAROUND (MASK) ×4 IMPLANT
FACESHIELD WRAPAROUND OR TEAM (MASK) ×2 IMPLANT
GAUZE SPONGE 4X4 12PLY STRL (GAUZE/BANDAGES/DRESSINGS) ×2 IMPLANT
GLOVE BIO SURGEON STRL SZ7 (GLOVE) ×1 IMPLANT
GLOVE BIO SURGEON STRL SZ8 (GLOVE) ×2 IMPLANT
GLOVE BIOGEL PI IND STRL 7.0 (GLOVE) IMPLANT
GLOVE BIOGEL PI IND STRL 7.5 (GLOVE) ×1 IMPLANT
GLOVE BIOGEL PI IND STRL 8 (GLOVE) ×2 IMPLANT
GLOVE BIOGEL PI INDICATOR 7.0 (GLOVE) ×2
GLOVE BIOGEL PI INDICATOR 7.5 (GLOVE) ×1
GLOVE BIOGEL PI INDICATOR 8 (GLOVE) ×2
GLOVE ECLIPSE 6.5 STRL STRAW (GLOVE) ×4 IMPLANT
GLOVE ORTHO TXT STRL SZ7.5 (GLOVE) ×2 IMPLANT
GOWN STRL REUS W/ TWL LRG LVL3 (GOWN DISPOSABLE) ×1 IMPLANT
GOWN STRL REUS W/ TWL XL LVL3 (GOWN DISPOSABLE) ×3 IMPLANT
GOWN STRL REUS W/TWL LRG LVL3 (GOWN DISPOSABLE) ×2
GOWN STRL REUS W/TWL XL LVL3 (GOWN DISPOSABLE) ×6
HANDPIECE INTERPULSE COAX TIP (DISPOSABLE) ×2
MANIFOLD NEPTUNE II (INSTRUMENTS) ×1 IMPLANT
NS IRRIG 1000ML POUR BTL (IV SOLUTION) ×2 IMPLANT
PACK ARTHROSCOPY DSU (CUSTOM PROCEDURE TRAY) ×2 IMPLANT
PACK BASIN DAY SURGERY FS (CUSTOM PROCEDURE TRAY) ×2 IMPLANT
PAD CLEANER CAUTERY TIP 5X5 (MISCELLANEOUS) ×1
PENCIL BUTTON HOLSTER BLD 10FT (ELECTRODE) ×2 IMPLANT
PIN STEINMANN THREADED TIP (PIN) IMPLANT
RETRIEVER SUT HEWSON (MISCELLANEOUS) IMPLANT
SET HNDPC FAN SPRY TIP SCT (DISPOSABLE) IMPLANT
SHEET MEDIUM DRAPE 40X70 STRL (DRAPES) ×2 IMPLANT
SLEEVE SCD COMPRESS KNEE MED (MISCELLANEOUS) ×2 IMPLANT
SLING ARM IMMOBILIZER LRG (SOFTGOODS) ×1 IMPLANT
SLING ARM IMMOBILIZER MED (SOFTGOODS) IMPLANT
SLING ARM LRG ADULT FOAM STRAP (SOFTGOODS) IMPLANT
SLING ARM MED ADULT FOAM STRAP (SOFTGOODS) IMPLANT
SLING ARM XL FOAM STRAP (SOFTGOODS) IMPLANT
SMARTMIX MINI TOWER (MISCELLANEOUS) ×2
SPONGE LAP 18X18 X RAY DECT (DISPOSABLE) ×2 IMPLANT
SPONGE LAP 4X18 X RAY DECT (DISPOSABLE) ×2 IMPLANT
SUCTION FRAZIER TIP 10 FR DISP (SUCTIONS) ×2 IMPLANT
SUPPORT WRAP ARM LG (MISCELLANEOUS) ×2 IMPLANT
SUT FIBERWIRE #2 38 T-5 BLUE (SUTURE) ×10
SUT MNCRL AB 4-0 PS2 18 (SUTURE) IMPLANT
SUT VIC AB 0 CT1 18XCR BRD 8 (SUTURE) IMPLANT
SUT VIC AB 0 CT1 27 (SUTURE) ×2
SUT VIC AB 0 CT1 27XBRD ANBCTR (SUTURE) ×1 IMPLANT
SUT VIC AB 0 CT1 8-18 (SUTURE)
SUT VIC AB 2-0 SH 27 (SUTURE)
SUT VIC AB 2-0 SH 27XBRD (SUTURE) IMPLANT
SUT VICRYL 3-0 CR8 SH (SUTURE) ×2 IMPLANT
SUTURE FIBERWR #2 38 T-5 BLUE (SUTURE) ×2 IMPLANT
SYR BULB IRRIGATION 50ML (SYRINGE) ×2 IMPLANT
TAPE STRIPS DRAPE STRL (GAUZE/BANDAGES/DRESSINGS) IMPLANT
TOWEL OR 17X24 6PK STRL BLUE (TOWEL DISPOSABLE) ×4 IMPLANT
TOWEL OR NON WOVEN STRL DISP B (DISPOSABLE) ×4 IMPLANT
TOWER SMARTMIX MINI (MISCELLANEOUS) ×1 IMPLANT
TUBE CONNECTING 20X1/4 (TUBING) IMPLANT
YANKAUER SUCT BULB TIP NO VENT (SUCTIONS) ×2 IMPLANT

## 2014-11-23 NOTE — Anesthesia Procedure Notes (Addendum)
Anesthesia Regional Block:  Interscalene brachial plexus block  Pre-Anesthetic Checklist: ,, timeout performed, Correct Patient, Correct Site, Correct Laterality, Correct Procedure, Correct Position, site marked, Risks and benefits discussed,  Surgical consent,  Pre-op evaluation,  At surgeon's request and post-op pain management  Laterality: Upper and Left  Prep: chloraprep and alcohol swabs       Needles:  Injection technique: Single-shot  Needle Type: Echogenic Needle     Needle Length: 5cm 5 cm Needle Gauge: 21 and 21 G  Needle insertion depth: 3 cm   Additional Needles:  Procedures: ultrasound guided (picture in chart) and nerve stimulator Interscalene brachial plexus block  Nerve Stimulator or Paresthesia:  Response: Twitch elicited, 0.5 mA, 0.3 ms,   Additional Responses:   Narrative:  Start time: 11/23/2014 9:00 AM End time: 11/23/2014 9:15 AM Injection made incrementally with aspirations every 5 mL.  Performed by: Personally  Anesthesiologist: MASSAGEE, TERRY  Additional Notes: Block assessed prior to start of surgery   Procedure Name: Intubation Date/Time: 11/23/2014 10:04 AM Performed by: Lyndee Leo Pre-anesthesia Checklist: Patient identified, Emergency Drugs available, Suction available and Patient being monitored Patient Re-evaluated:Patient Re-evaluated prior to inductionOxygen Delivery Method: Circle System Utilized Preoxygenation: Pre-oxygenation with 100% oxygen Intubation Type: IV induction Ventilation: Mask ventilation without difficulty Laryngoscope Size: Mac and 3 Grade View: Grade III Tube type: Oral Tube size: 7.0 mm Number of attempts: 1 Airway Equipment and Method: Stylet and Oral airway Placement Confirmation: ETT inserted through vocal cords under direct vision,  positive ETCO2 and breath sounds checked- equal and bilateral Secured at: 19 cm Tube secured with: Tape Dental Injury: Teeth and Oropharynx as per pre-operative  assessment

## 2014-11-23 NOTE — Progress Notes (Signed)
Assisted Dr. Orene Desanctis with right, ultrasound guided, supraclavicular block. Side rails up, monitors on throughout procedure. See vital signs in flow sheet. Tolerated Procedure well.

## 2014-11-23 NOTE — Op Note (Signed)
11/23/2014  11:49 AM  PATIENT:  Molly Peters    PRE-OPERATIVE DIAGNOSIS:  PRIMARY OSTEOARTHRITIS RIGHT SHOULDER   POST-OPERATIVE DIAGNOSIS:  Same  PROCEDURE:  TOTAL RIGHT SHOULDER ARTHROPLASTY  SURGEON:  Johnny Bridge, MD  PHYSICIAN ASSISTANT: Joya Gaskins, OPA-C, present and scrubbed throughout the case, critical for completion in a timely fashion, and for retraction, instrumentation, and closure.  ANESTHESIA:   General  PREOPERATIVE INDICATIONS:  Molly Peters is a  54 y.o. female with a diagnosis of PRIMARY OSTEOARTHRITIS RIGHT SHOULDER  who failed conservative measures and elected for surgical management.    The risks benefits and alternatives were discussed with the patient preoperatively including but not limited to the risks of infection, bleeding, nerve injury, cardiopulmonary complications, the need for revision surgery, dislocation, loosening, incomplete relief of pain, among others, and the patient was willing to proceed.   OPERATIVE IMPLANTS: Biomet size 9 mini press-fit humeral stem, size 42 +18 Versa-dial humeral head, set in the E position with increased coverage posteriorly, with a small cemented glenoid polyethylene 3 peg implant with a central regenerex noncemented post.   OPERATIVE FINDINGS: Advanced glenohumeral osteoarthritis involving the glenoid and the humeral head with substantial osteophyte formation inferiorly.   OPERATIVE PROCEDURE: The patient was brought to the operating room and placed in the supine position. General anesthesia was administered. IV antibiotics were given.  The upper extremity was prepped and draped in usual sterile fashion. The patient was in a beachchair position with all bony prominences padded.   Time out was performed and a deltopectoral approach was carried out. The biceps tendon was tenodesed to the pectoralis tendon. The subscapularis was released, tagging it with a #2 Fiberwire, leaving a cuff of tendon for repair.    The inferior osteophyte was removed, and release of the capsule off of the humeral side was completed. The head was dislocated, and I reamed sequentially. I placed the humeral cutting guide at 30 of retroversion, and then pinned this into place, and made my humeral neck cut. This was at the appropriate level.   I then placed deep retractors and exposed the glenoid. I excised the labrum circumferentially, taking care to protect the axillary nerve inferiorly.   I then placed a guidewire into the center position, controlling appropriate version and inclination. I then reamed over the guidewire with the small reamer, and was satisfied with the preparation. I preserved the subchondral bone in order to maximize the strength and minimize the risk for subsequent subsidence.   I then drilled the central hole for the regenerex peg, and then placed the guide, and then drilled the 3 peripheral peg holes. I had excellent bony circumferential contact. All 3 holes had bony endpoints, the central regenerate exposed I think filled the full depth of the vault, and was just at the opposite cortex, and did slightly penetrate.  I then cleaned the glenoid, irrigated it copiously, and then dried it and cemented the prosthesis into place. Excellent seating was achieved. I had full exposure. The cement cured, and then I turned my attention to the humeral side.   I sequentially broached, up to the selected size, with the broach set at 30 of retroversion. I then placed the real stem. I trialed with multiple heads, and the above-named component was selected. Increased posterior coverage improved the coverage. The soft tissue tension was appropriate.   I then impacted the real humeral head into place, reduced the head, and irrigated copiously. Excellent stability and range of motion was achieved.  I repaired the subscapularis with 4 #2 Fiberwire, as well as the rotator interval, and irrigated copiously once more. The  subcutaneous tissue was closed with Vicryl including the deltopectoral fascia.   The skin was closed with Steri-Strips and sterile gauze was applied. She had a preoperative nerve block. She tolerated the procedure well and there were no complications.

## 2014-11-23 NOTE — Discharge Instructions (Signed)
Diet: As you were doing prior to hospitalization   Shower:  May shower but keep the wounds dry, use an occlusive plastic wrap, NO SOAKING IN TUB.  If the bandage gets wet, change with a clean dry gauze.  Dressing:  You may change your dressing 3-5 days after surgery.  Then change the dressing daily with sterile gauze dressing.    There are sticky tapes (steri-strips) on your wounds and all the stitches are absorbable.  Leave the steri-strips in place when changing your dressings, they will peel off with time, usually 2-3 weeks.  Activity:  Increase activity slowly as tolerated, but follow the weight bearing instructions below.  No lifting or driving for 6 weeks.  Weight Bearing:   Sling at all times except hygiene..    To prevent constipation: you may use a stool softener such as -  Colace (over the counter) 100 mg by mouth twice a day  Drink plenty of fluids (prune juice may be helpful) and high fiber foods Miralax (over the counter) for constipation as needed.    Itching:  If you experience itching with your medications, try taking only a single pain pill, or even half a pain pill at a time.  You may take up to 10 pain pills per day, and you can also use benadryl over the counter for itching or also to help with sleep.   Precautions:  If you experience chest pain or shortness of breath - call 911 immediately for transfer to the hospital emergency department!!  If you develop a fever greater that 101 F, purulent drainage from wound, increased redness or drainage from wound, or calf pain -- Call the office at [phone]                                                Follow- Up Appointment:  Please call for an appointment to be seen in 2 weeks [employer] - [phone]     Post Anesthesia Home Care Instructions  Activity: Get plenty of rest for the remainder of the day. A responsible adult should stay with you for 24 hours following the procedure.  For the next 24 hours, DO  NOT: -Drive a car -Paediatric nurse -Drink alcoholic beverages -Take any medication unless instructed by your physician -Make any legal decisions or sign important papers.  Meals: Start with liquid foods such as gelatin or soup. Progress to regular foods as tolerated. Avoid greasy, spicy, heavy foods. If nausea and/or vomiting occur, drink only clear liquids until the nausea and/or vomiting subsides. Call your physician if vomiting continues.  Special Instructions/Symptoms: Your throat may feel dry or sore from the anesthesia or the breathing tube placed in your throat during surgery. If this causes discomfort, gargle with warm salt water. The discomfort should disappear within 24 hours. Regional Anesthesia Blocks  1. Numbness or the inability to move the "blocked" extremity may last from 3-48 hours after placement. The length of time depends on the medication injected and your individual response to the medication. If the numbness is not going away after 48 hours, call your surgeon.  2. The extremity that is blocked will need to be protected until the numbness is gone and the  Strength has returned. Because you cannot feel it, you will need to take extra care to avoid injury. Because it may be weak, you may have  difficulty moving it or using it. You may not know what position it is in without looking at it while the block is in effect.  3. For blocks in the legs and feet, returning to weight bearing and walking needs to be done carefully. You will need to wait until the numbness is entirely gone and the strength has returned. You should be able to move your leg and foot normally before you try and bear weight or walk. You will need someone to be with you when you first try to ensure you do not fall and possibly risk injury.  4. Bruising and tenderness at the needle site are common side effects and will resolve in a few days.  5. Persistent numbness or new problems with movement should be  communicated to the surgeon or the [place] [phone] [place] [phone]).

## 2014-11-23 NOTE — Transfer of Care (Signed)
Immediate Anesthesia Transfer of Care Note  Patient: Molly Peters  Procedure(s) Performed: Procedure(s): TOTAL RIGHT SHOULDER ARTHROPLASTY (Right)  Patient Location: PACU  Anesthesia Type:GA combined with regional for post-op pain  Level of Consciousness: sedated  Airway & Oxygen Therapy: Patient Spontanous Breathing and Patient connected to face mask oxygen  Post-op Assessment: Report given to PACU RN and Post -op Vital signs reviewed and stable  Post vital signs: Reviewed and stable  Complications: No apparent anesthesia complications

## 2014-11-23 NOTE — Anesthesia Postprocedure Evaluation (Signed)
  Anesthesia Post-op Note  Patient: Molly Peters  Procedure(s) Performed: Procedure(s): TOTAL RIGHT SHOULDER ARTHROPLASTY (Right)  Patient Location: PACU  Anesthesia Type:GA combined with regional for post-op pain  Level of Consciousness: awake and alert   Airway and Oxygen Therapy: Patient Spontanous Breathing  Post-op Pain: none  Post-op Assessment: Post-op Vital signs reviewed  Post-op Vital Signs: stable  Last Vitals:  Filed Vitals:   11/23/14 1345  BP: 104/71  Pulse: 95  Temp: 36.7 C  Resp: 16    Complications: No apparent anesthesia complications

## 2014-11-23 NOTE — H&P (Signed)
PREOPERATIVE H&P  Chief Complaint: PRIMARY OSTEOARTHRITIS RIGHT SHOULDER   HPI: Molly Peters is a 54 y.o. female who presents for preoperative history and physical with a diagnosis of PRIMARY OSTEOARTHRITIS RIGHT SHOULDER . Symptoms are rated as moderate to severe, and have been worsening.  This is significantly impairing activities of daily living.  She has elected for surgical management. She has failed injections, activity modification, as well as previous arthroscopic interventions, and extensive pain medication management.  Past Medical History  Diagnosis Date  . Depression   . GERD (gastroesophageal reflux disease)   . Anxiety   . Valvular heart disease     Initial workup a number of years ago at [facility]  Echo (10/2009) showed EF 60-65%,      normal LV size, moderate aortic insufficiency with a trileaflet aortic valve and normal aortic root size.  Mild      mitral regurgitation.   . Fibromyalgia   . IBS (irritable bowel syndrome)   . Seizures     last seizure 2010  . History of gastric ulcer   . Hypertension     states under control with med., has been on med. x 1-2 yr.  . Internal hemorrhoid     states has had intermittent bright red bleeding with BM (11/15/2014)  . COPD (chronic obstructive pulmonary disease)     states SOB with ADLs; no O2 use; able to speak in complete sentences without SOB(11/15/2014)  . Osteoarthritis of right shoulder 11/2014  . Family history of adverse reaction to anesthesia     pt's mother and sister have hx. of post-op N/V  . TMJ syndrome   . Limited joint range of motion     neck - s/p cervical fusion  . Wears dentures     lower  . Difficulty swallowing pills     s/p cervical fusion  . Short-term memory loss    Past Surgical History  Procedure Laterality Date  . Abdominal hysterectomy      partial  . Esophagogastroduodenoscopy  09/28/2008  . Nm myoview ltd      Lexiscan myoview (10/2009): EF 77%, normal wall motion, normal perfusion.   Marland Kitchen  Anterior cervical decomp/discectomy fusion  02/06/2011    C4-5, C5-6, C6-7  . Colonoscopy  09/28/2008  . Shoulder arthroscopy with debridement and bicep tendon repair Right 04/13/2014    Procedure: RIGHT SHOULDER ARTHROSCOPY WITH DEBRIDEMENT EXTENSIVE;  Surgeon: Johnny Bridge, MD;  Location: [place];  Service: Orthopedics;  Laterality: Right;  . Cesarean section      x 2  . Breast lumpectomy Bilateral 1989    x 3 - benign  . Laminectomy with posterior lateral arthrodesis level 3  12/06/2009    with synovial cyst resection L3-4 bilat.  . Cardiac catheterization  1995  . Laminectomy with posterior lateral arthrodesis level 3  11/17/2006    L4-5  . Hardware removal  11/17/2006    L5-S1   History   Social History  . Marital Status: Married    Spouse Name: N/A    Number of Children: 3  . Years of Education: N/A   Occupational History  . Disabled 2003    Social History Main Topics  . Smoking status: Current Every Day Smoker -- 0.50 packs/day for 42 years    Types: Cigarettes  . Smokeless tobacco: Never Used  . Alcohol Use: Yes     Comment: occasionally  . Drug Use: No  . Sexual Activity: None   Other  Topics Concern  . None   Social History Narrative       Family History  Problem Relation Age of Onset  . Arthritis Mother   . Cervical cancer Mother   . Anesthesia problems Mother     post-op N/V  . Healthy Father   . Cancer Father     colon cancer  . Migraines Sister   . Cervical cancer Sister   . Anesthesia problems Sister     post-op N/V  . Migraines Brother   . Asthma Daughter   . Coronary artery disease Maternal Grandmother   . Hypertension Maternal Grandmother   . Diabetes Maternal Grandmother   . Coronary artery disease Paternal Grandmother   . Hypertension Paternal Grandmother   . Diabetes Paternal Grandmother    Allergies  Allergen Reactions  . Carbamazepine Hives  . Clonazepam Other (See Comments)    HALLUCINATIONS  . Divalproex Sodium  Swelling    HAIR FALLS OUT  . Diphenhydramine Hcl Other (See Comments)    UNKNOWN  . Tiotropium Bromide Monohydrate Other (See Comments)    CREATES YEAST IN THROAT  . Vicodin [Hydrocodone-Acetaminophen] Itching   Prior to Admission medications   Medication Sig Start Date End Date Taking? Authorizing Provider  ALPRAZolam Duanne Moron) 1 MG tablet Take 1 tablet (1 mg total) by mouth 3 (three) times daily as needed for anxiety. Patient taking differently: Take 1 mg by mouth 2 (two) times daily.  11/08/14  Yes Jearld Fenton, NP  cyclobenzaprine (FLEXERIL) 10 MG tablet Take 10 mg by mouth at bedtime.    Yes Historical Provider, MD  diclofenac (VOLTAREN) 50 MG EC tablet Take 50 mg by mouth 2 (two) times daily.   Yes Historical Provider, MD  gabapentin (NEURONTIN) 300 MG capsule Take 1 Capsule Twice Daily and Take 2 Capsules At Bedtime. Patient taking differently: Take 1,200 mg by mouth at bedtime.  08/09/14  Yes Venia Carbon, MD  lamoTRIgine (LAMICTAL) 100 MG tablet TAKE 1 TABLET TWICE DAILY 10/17/14  Yes Venia Carbon, MD  losartan-hydrochlorothiazide [facility]) 100-12.5 MG per tablet TAKE 1 TABLET EVERY DAY 05/14/14  Yes Venia Carbon, MD  omeprazole (PRILOSEC) 20 MG capsule TAKE 1 CAPSULE EVERY DAY 04/19/14  Yes Venia Carbon, MD  PARoxetine (PAXIL) 20 MG tablet TAKE 1 TABLET EVERY MORNING 12/18/13  Yes Venia Carbon, MD  tiZANidine (ZANAFLEX) 4 MG tablet Take 1 tablet (4 mg total) by mouth Nightly. 03/07/14  Yes Spencer Copland, MD     Positive ROS: All other systems have been reviewed and were otherwise negative with the exception of those mentioned in the HPI and as above.  Physical Exam: General: Alert, no acute distress Cardiovascular: No pedal edema Respiratory: No cyanosis, no use of accessory musculature GI: No organomegaly, abdomen is soft and non-tender Skin: No lesions in the area of chief complaint Neurologic: Sensation intact distally Psychiatric: Patient is competent for  consent with normal mood and affect Lymphatic: No axillary or cervical lymphadenopathy  MUSCULOSKELETAL: Right shoulder active motion is 0-80 with external rotation to neutral and intact cuff strength. Prior surgical incisions are healed. Moderate crepitance.  Assessment: PRIMARY OSTEOARTHRITIS RIGHT SHOULDER   Plan: Plan for Procedure(s): TOTAL RIGHT SHOULDER ARTHROPLASTY  The risks benefits and alternatives were discussed with the patient including but not limited to the risks of nonoperative treatment, versus surgical intervention including infection, bleeding, nerve injury,  blood clots, cardiopulmonary complications, morbidity, mortality, among others, and they were willing to proceed.   Tiant Peixoto  P, MD [facility] [phone]   11/23/2014 7:24 AM

## 2014-11-23 NOTE — Anesthesia Preprocedure Evaluation (Addendum)
Anesthesia Evaluation  Patient identified by MRN, date of birth, ID band Patient awake    Reviewed: Allergy & Precautions, H&P , NPO status , Patient's Chart, lab work & pertinent test results  Airway Mallampati: II   Neck ROM: Limited  Mouth opening: Limited Mouth Opening  Dental  (+) Edentulous Lower, Poor Dentition, Dental Advidsory Given, Implants   Pulmonary COPDCurrent Smoker,  breath sounds clear to auscultation        Cardiovascular Exercise Tolerance: Good hypertension, Pt. on medications Rhythm:regular Rate:Normal  10-09-10  - Left ventricle: The cavity size was normal. Systolic function was    normal. Wall motion was normal; there were no regional wall motion    abnormalities. Doppler parameters are consistent with abnormal    left ventricular relaxation (grade 1 diastolic dysfunction).  - Aortic valve: Mild regurgitation. In select images, regurgitation    appears to be mild to moderate. Valve area: 2.12cm^2(VTI). Valve    area: 2.09cm^2 (Vmax).  - Mitral valve: Mild regurgitation.  - Left atrium: The atrium was normal in size.  - Right ventricle: Systolic function was normal.  - Tricuspid valve: Mild regurgitation.  - Pulmonary arteries: Systolic pressure was within the normal range.   Neuro/Psych  Headaches, Seizures -, Well Controlled,  PSYCHIATRIC DISORDERS  Neuromuscular disease negative psych ROS   GI/Hepatic Neg liver ROS, PUD, GERD-  Medicated and Controlled,  Endo/Other  negative endocrine ROS  Renal/GU      Musculoskeletal   Abdominal   Peds  Hematology   Anesthesia Other Findings Implants lower  Reproductive/Obstetrics negative OB ROS                            Anesthesia Physical Anesthesia Plan  ASA: III  Anesthesia Plan: General ETT   Post-op Pain Management: MAC Combined w/ Regional for Post-op pain   Induction: Intravenous  Airway Management Planned:  Oral ETT  Additional Equipment:   Intra-op Plan:   Post-operative Plan: Extubation in OR  Informed Consent: I have reviewed the patients History and Physical, chart, labs and discussed the procedure including the risks, benefits and alternatives for the proposed anesthesia with the patient or authorized representative who has indicated his/her understanding and acceptance.   Dental Advisory Given  Plan Discussed with: CRNA and Surgeon  Anesthesia Plan Comments:        Anesthesia Quick Evaluation

## 2014-11-24 DIAGNOSIS — M19011 Primary osteoarthritis, right shoulder: Secondary | ICD-10-CM | POA: Diagnosis not present

## 2014-11-27 ENCOUNTER — Encounter (HOSPITAL_BASED_OUTPATIENT_CLINIC_OR_DEPARTMENT_OTHER): Payer: Self-pay | Admitting: Orthopedic Surgery

## 2014-12-13 ENCOUNTER — Other Ambulatory Visit: Payer: Self-pay | Admitting: *Deleted

## 2014-12-13 NOTE — Telephone Encounter (Signed)
Rx called in 

## 2014-12-13 NOTE — Telephone Encounter (Signed)
Ok to refill? Faxed in refill request. Last prescribed on 11/08/14. Last seen on 04/04/14. No future appt.

## 2014-12-13 NOTE — Telephone Encounter (Signed)
Alprazolam 1 mg Take 1 tablet  by mouth 3  times daily as needed for anxiety Qty 90

## 2014-12-13 NOTE — Telephone Encounter (Signed)
Okay #90 x 0 

## 2015-01-31 ENCOUNTER — Other Ambulatory Visit: Payer: Self-pay | Admitting: *Deleted

## 2015-01-31 NOTE — Telephone Encounter (Signed)
Last filled 12/13/14, should pt be taking this once or twice daily? It's 2 different set of instructions in her chart

## 2015-02-01 NOTE — Telephone Encounter (Signed)
Approved: #90 x 0 It has been tid prn I believe

## 2015-02-04 MED ORDER — ALPRAZOLAM 1 MG PO TABS: 1.0000 mg | ORAL_TABLET | Freq: Three times a day (TID) | ORAL | Status: DC | PRN

## 2015-02-04 NOTE — Telephone Encounter (Signed)
Pt left v/m requesting cb with status of xanax refill. 

## 2015-02-04 NOTE — Telephone Encounter (Signed)
Called to [facility].  Jeneen notified prescription has been called to her pharmacy.

## 2015-02-05 ENCOUNTER — Ambulatory Visit: Payer: Medicare PPO | Admitting: Internal Medicine

## 2015-02-28 ENCOUNTER — Ambulatory Visit (INDEPENDENT_AMBULATORY_CARE_PROVIDER_SITE_OTHER): Payer: Medicare PPO | Admitting: Internal Medicine

## 2015-02-28 ENCOUNTER — Encounter: Payer: Self-pay | Admitting: Internal Medicine

## 2015-02-28 VITALS — BP 100/70 | HR 67 | Temp 98.6°F | Wt 100.0 lb

## 2015-02-28 DIAGNOSIS — J449 Chronic obstructive pulmonary disease, unspecified: Secondary | ICD-10-CM | POA: Diagnosis not present

## 2015-02-28 DIAGNOSIS — I38 Endocarditis, valve unspecified: Secondary | ICD-10-CM | POA: Diagnosis not present

## 2015-02-28 DIAGNOSIS — G40909 Epilepsy, unspecified, not intractable, without status epilepticus: Secondary | ICD-10-CM

## 2015-02-28 DIAGNOSIS — F411 Generalized anxiety disorder: Secondary | ICD-10-CM | POA: Diagnosis not present

## 2015-02-28 MED ORDER — ALPRAZOLAM 1 MG PO TABS: 1.0000 mg | ORAL_TABLET | Freq: Three times a day (TID) | ORAL | Status: DC | PRN

## 2015-02-28 NOTE — Progress Notes (Signed)
Subjective:    Patient ID: Molly Peters, female    DOB: Feb 18, 1961, 54 y.o.   MRN: [id number]  HPI Here for follow up of anxiety and other conditions  Did have the right shoulder surgery Is slowly improving Trying to cut down on the narcotics She is careful to not take more than prescribed  Has daily anxiety Some stressors but not daily sadness 1 step son just out of prison--now living with her (drugs)--her meds are locked away Not anhedonic  Still smokes cigarettes--but has cut down Not ready to stop smoking--would like to (but only stopped 3 days in past before relapsing) She has called the hot line--not helpful Coughs daily--- produces mucus Breathing is short at times--feels more restricted lately  Current Outpatient Prescriptions on File Prior to Visit  Medication Sig Dispense Refill  . ALPRAZolam (XANAX) 1 MG tablet Take 1 tablet (1 mg total) by mouth 3 (three) times daily as needed for anxiety. 90 tablet 0  . gabapentin (NEURONTIN) 300 MG capsule Take 1 Capsule Twice Daily and Take 2 Capsules At Bedtime. (Patient taking differently: Take 1,200 mg by mouth at bedtime. ) 360 capsule 3  . HYDROmorphone (DILAUDID) 2 MG tablet Take 1 tablet (2 mg total) by mouth every 4 (four) hours as needed for severe pain. 50 tablet 0  . lamoTRIgine (LAMICTAL) 100 MG tablet TAKE 1 TABLET TWICE DAILY 180 tablet 3  . losartan-hydrochlorothiazide (HYZAAR) 100-12.5 MG per tablet TAKE 1 TABLET EVERY DAY 90 tablet 0  . omeprazole (PRILOSEC) 20 MG capsule TAKE 1 CAPSULE EVERY DAY 90 capsule 3  . orphenadrine (NORFLEX) 100 MG tablet Take 1 tablet (100 mg total) by mouth 2 (two) times daily as needed for muscle spasms. 75 tablet 0  . PARoxetine (PAXIL) 20 MG tablet TAKE 1 TABLET EVERY MORNING 90 tablet 3   No current facility-administered medications on file prior to visit.    Allergies  Allergen Reactions  . Carbamazepine Hives  . Clonazepam Other (See Comments)    HALLUCINATIONS  .  Divalproex Sodium Swelling    HAIR FALLS OUT  . Diphenhydramine Hcl Other (See Comments)    UNKNOWN  . Tiotropium Bromide Monohydrate Other (See Comments)    CREATES YEAST IN THROAT  . Vicodin [Hydrocodone-Acetaminophen] Itching    Past Medical History  Diagnosis Date  . Depression   . GERD (gastroesophageal reflux disease)   . Anxiety   . Valvular heart disease     Initial workup a number of years ago at [facility].  Echo (10/2009) showed EF 60-65%,      normal LV size, moderate aortic insufficiency with a trileaflet aortic valve and normal aortic root size.  Mild      mitral regurgitation.   . Fibromyalgia   . IBS (irritable bowel syndrome)   . Seizures     last seizure 2010  . History of gastric ulcer   . Hypertension     states under control with med., has been on med. x 1-2 yr.  . Internal hemorrhoid     states has had intermittent bright red bleeding with BM (11/15/2014)  . COPD (chronic obstructive pulmonary disease)     states SOB with ADLs; no O2 use; able to speak in complete sentences without SOB(11/15/2014)  . Osteoarthritis of right shoulder 11/2014  . Family history of adverse reaction to anesthesia     pt's mother and sister have hx. of post-op N/V  . TMJ syndrome   . Limited joint range of  motion     neck - s/p cervical fusion  . Wears dentures     lower  . Difficulty swallowing pills     s/p cervical fusion  . Short-term memory loss   . Localized primary osteoarthritis of right shoulder region 11/23/2014    Past Surgical History  Procedure Laterality Date  . Abdominal hysterectomy      partial  . Esophagogastroduodenoscopy  09/28/2008  . Nm myoview ltd      Lexiscan myoview (10/2009): EF 77%, normal wall motion, normal perfusion.   Marland Kitchen Anterior cervical decomp/discectomy fusion  02/06/2011    C4-5, C5-6, C6-7  . Colonoscopy  09/28/2008  . Shoulder arthroscopy with debridement and bicep tendon repair Right 04/13/2014    Procedure: RIGHT SHOULDER ARTHROSCOPY WITH  DEBRIDEMENT EXTENSIVE;  Surgeon: Johnny Bridge, MD;  Location: [place];  Service: Orthopedics;  Laterality: Right;  . Cesarean section      x 2  . Breast lumpectomy Bilateral 1989    x 3 - benign  . Laminectomy with posterior lateral arthrodesis level 3  12/06/2009    with synovial cyst resection L3-4 bilat.  . Cardiac catheterization  1995  . Laminectomy with posterior lateral arthrodesis level 3  11/17/2006    L4-5  . Hardware removal  11/17/2006    L5-S1  . Total shoulder arthroplasty Right 11/23/2014    Procedure: TOTAL RIGHT SHOULDER ARTHROPLASTY;  Surgeon: Johnny Bridge, MD;  Location: [place];  Service: Orthopedics;  Laterality: Right;    Family History  Problem Relation Age of Onset  . Arthritis Mother   . Cervical cancer Mother   . Anesthesia problems Mother     post-op N/V  . Healthy Father   . Cancer Father     colon cancer  . Migraines Sister   . Cervical cancer Sister   . Anesthesia problems Sister     post-op N/V  . Migraines Brother   . Asthma Daughter   . Coronary artery disease Maternal Grandmother   . Hypertension Maternal Grandmother   . Diabetes Maternal Grandmother   . Coronary artery disease Paternal Grandmother   . Hypertension Paternal Grandmother   . Diabetes Paternal Grandmother     History   Social History  . Marital Status: Married    Spouse Name: N/A  . Number of Children: 3  . Years of Education: N/A   Occupational History  . Disabled 2003    Social History Main Topics  . Smoking status: Current Every Day Smoker -- 0.50 packs/day for 42 years    Types: Cigarettes  . Smokeless tobacco: Never Used  . Alcohol Use: Yes     Comment: occasionally  . Drug Use: No  . Sexual Activity: Not on file   Other Topics Concern  . Not on file   Social History Narrative       Review of Systems Sleep is variable--mostly a problem with shoulder pain or RLS Appetite not great---weight stable    Objective:     Physical Exam  Constitutional: She appears well-developed. No distress.  Neck: Normal range of motion. Neck supple. No thyromegaly present.  Cardiovascular: Normal rate and regular rhythm.  Exam reveals no gallop.   Systolic and diastolic murmurs in aortic area  Pulmonary/Chest: Breath sounds normal. No respiratory distress. She has no wheezes. She has no rales.  Musculoskeletal: She exhibits no edema or tenderness.  Lymphadenopathy:    She has no cervical adenopathy.  Psychiatric: She has  a normal mood and affect. Her behavior is normal.          Assessment & Plan:

## 2015-02-28 NOTE — Assessment & Plan Note (Signed)
Still working on stopping smoking but has been unsuccessful Chronic productive cough--no recent infection

## 2015-02-28 NOTE — Assessment & Plan Note (Signed)
No CHF Follows with cardiology

## 2015-02-28 NOTE — Assessment & Plan Note (Signed)
Quiet on the lamictal

## 2015-02-28 NOTE — Assessment & Plan Note (Signed)
Daily anxiety symptoms Hasn't done well with other Rx than alprazolam Will continue

## 2015-04-09 ENCOUNTER — Other Ambulatory Visit: Payer: Self-pay | Admitting: Internal Medicine

## 2015-04-23 ENCOUNTER — Telehealth: Payer: Self-pay

## 2015-04-23 NOTE — Telephone Encounter (Signed)
What is the reason for the deferral?

## 2015-04-23 NOTE — Telephone Encounter (Signed)
Per pt she states she can't sit or stand for long periods of time and [place].

## 2015-04-23 NOTE — Telephone Encounter (Signed)
Pt left v/m requesting letter to excuse pt from jury duty next week. Pt request cb.

## 2015-04-24 ENCOUNTER — Encounter: Payer: Self-pay | Admitting: Internal Medicine

## 2015-04-24 DIAGNOSIS — Z7689 Persons encountering health services in other specified circumstances: Secondary | ICD-10-CM

## 2015-04-24 NOTE — Telephone Encounter (Signed)
I left a message on patient's voice mail notifying her the letter is ready and of $20 charge.

## 2015-04-24 NOTE — Telephone Encounter (Signed)
Letter done $20 charge 

## 2015-04-25 ENCOUNTER — Emergency Department
Admission: EM | Admit: 2015-04-25 | Discharge: 2015-04-25 | Disposition: A | Discharge status: Home / Self Care | Payer: Medicare PPO | Attending: Emergency Medicine | Admitting: Emergency Medicine

## 2015-04-25 ENCOUNTER — Emergency Department: Payer: Medicare PPO

## 2015-04-25 ENCOUNTER — Encounter: Payer: Self-pay | Admitting: Emergency Medicine

## 2015-04-25 DIAGNOSIS — M4684 Other specified inflammatory spondylopathies, thoracic region: Secondary | ICD-10-CM | POA: Diagnosis not present

## 2015-04-25 DIAGNOSIS — Z720 Tobacco use: Secondary | ICD-10-CM | POA: Diagnosis not present

## 2015-04-25 DIAGNOSIS — M479 Spondylosis, unspecified: Secondary | ICD-10-CM | POA: Diagnosis not present

## 2015-04-25 DIAGNOSIS — Z79899 Other long term (current) drug therapy: Secondary | ICD-10-CM | POA: Diagnosis not present

## 2015-04-25 DIAGNOSIS — M542 Cervicalgia: Secondary | ICD-10-CM | POA: Diagnosis present

## 2015-04-25 DIAGNOSIS — M5090 Cervical disc disorder, unspecified, unspecified cervical region: Secondary | ICD-10-CM | POA: Diagnosis not present

## 2015-04-25 DIAGNOSIS — M4185 Other forms of scoliosis, thoracolumbar region: Secondary | ICD-10-CM | POA: Diagnosis not present

## 2015-04-25 DIAGNOSIS — I10 Essential (primary) hypertension: Secondary | ICD-10-CM | POA: Insufficient documentation

## 2015-04-25 DIAGNOSIS — M47814 Spondylosis without myelopathy or radiculopathy, thoracic region: Secondary | ICD-10-CM

## 2015-04-25 DIAGNOSIS — M5032 Other cervical disc degeneration, mid-cervical region: Secondary | ICD-10-CM | POA: Insufficient documentation

## 2015-04-25 DIAGNOSIS — M47812 Spondylosis without myelopathy or radiculopathy, cervical region: Secondary | ICD-10-CM

## 2015-04-25 MED ORDER — CYCLOBENZAPRINE HCL 10 MG PO TABS: 10.0000 mg | ORAL_TABLET | Freq: Three times a day (TID) | ORAL | Status: DC | PRN

## 2015-04-25 MED ORDER — ORPHENADRINE CITRATE 30 MG/ML IJ SOLN
INTRAMUSCULAR | Status: AC
  Filled 2015-04-25: qty 2

## 2015-04-25 MED ORDER — NAPROXEN 500 MG PO TABS: 500.0000 mg | ORAL_TABLET | Freq: Two times a day (BID) | ORAL | Status: DC

## 2015-04-25 MED ORDER — ORPHENADRINE CITRATE 30 MG/ML IJ SOLN
60.0000 mg | Freq: Every day | INTRAMUSCULAR | Status: DC
  Administered 2015-04-25: 60 mg via INTRAMUSCULAR

## 2015-04-25 NOTE — ED Provider Notes (Signed)
CSN: [id number]     Arrival date & time 04/25/15  1532 History   First MD Initiated Contact with Patient 04/25/15 1619     Chief Complaint  Patient presents with  . Neck Pain     HPI Comments: 54 year old female presents today complaining of neck and upper back pain that began this morning. She reports that when she woke up she felt very stiff in her neck and upper back. She has not had any tingling or numbness into her hands. No difficulty with grip strength. She has a history of prior neck surgery and fibromyalgia. Records indicate she has taken oxycodone and Dilaudid in the past for the same. She reports she took Aleve today without any relief. No new injury to his neck.  Patient is a 54 y.o. female presenting with back pain. The history is provided by the patient.  Back Pain Location:  Thoracic spine Quality:  Aching Radiates to:  L shoulder and R shoulder Pain severity:  Moderate Pain is:  Same all the time Onset quality:  Gradual Duration:  5 hours Timing:  Constant Progression:  Unchanged Chronicity:  Chronic Context: not falling and not recent injury   Relieved by:  None tried Worsened by:  Movement, standing, ambulation and bending Ineffective treatments:  NSAIDs Associated symptoms: headaches   Associated symptoms: no bladder incontinence, no leg pain, no numbness, no paresthesias, no perianal numbness, no tingling and no weakness     Past Medical History  Diagnosis Date  . Depression   . GERD (gastroesophageal reflux disease)   . Anxiety   . Valvular heart disease     Initial workup a number of years ago at [facility].  Echo (10/2009) showed EF 60-65%,      normal LV size, moderate aortic insufficiency with a trileaflet aortic valve and normal aortic root size.  Mild      mitral regurgitation.   . Fibromyalgia   . IBS (irritable bowel syndrome)   . Seizures     last seizure 2010  . History of gastric ulcer   . Hypertension     states under control with med., has been on  med. x 1-2 yr.  . Internal hemorrhoid     states has had intermittent bright red bleeding with BM (11/15/2014)  . COPD (chronic obstructive pulmonary disease)     states SOB with ADLs; no O2 use; able to speak in complete sentences without SOB(11/15/2014)  . Osteoarthritis of right shoulder 11/2014  . Family history of adverse reaction to anesthesia     pt's mother and sister have hx. of post-op N/V  . TMJ syndrome   . Limited joint range of motion     neck - s/p cervical fusion  . Wears dentures     lower  . Difficulty swallowing pills     s/p cervical fusion  . Short-term memory loss   . Localized primary osteoarthritis of right shoulder region 11/23/2014   Past Surgical History  Procedure Laterality Date  . Abdominal hysterectomy      partial  . Esophagogastroduodenoscopy  09/28/2008  . Nm myoview ltd      Lexiscan myoview (10/2009): EF 77%, normal wall motion, normal perfusion.   Marland Kitchen Anterior cervical decomp/discectomy fusion  02/06/2011    C4-5, C5-6, C6-7  . Colonoscopy  09/28/2008  . Shoulder arthroscopy with debridement and bicep tendon repair Right 04/13/2014    Procedure: RIGHT SHOULDER ARTHROSCOPY WITH DEBRIDEMENT EXTENSIVE;  Surgeon: Johnny Bridge, MD;  Location: [facility];  Service: Orthopedics;  Laterality: Right;  . Cesarean section      x 2  . Breast lumpectomy Bilateral 1989    x 3 - benign  . Laminectomy with posterior lateral arthrodesis level 3  12/06/2009    with synovial cyst resection L3-4 bilat.  . Cardiac catheterization  1995  . Laminectomy with posterior lateral arthrodesis level 3  11/17/2006    L4-5  . Hardware removal  11/17/2006    L5-S1  . Total shoulder arthroplasty Right 11/23/2014    Procedure: TOTAL RIGHT SHOULDER ARTHROPLASTY;  Surgeon: Johnny Bridge, MD;  Location: [place];  Service: Orthopedics;  Laterality: Right;   Family History  Problem Relation Age of Onset  . Arthritis Mother   . Cervical cancer Mother     . Anesthesia problems Mother     post-op N/V  . Healthy Father   . Cancer Father     colon cancer  . Migraines Sister   . Cervical cancer Sister   . Anesthesia problems Sister     post-op N/V  . Migraines Brother   . Asthma Daughter   . Coronary artery disease Maternal Grandmother   . Hypertension Maternal Grandmother   . Diabetes Maternal Grandmother   . Coronary artery disease Paternal Grandmother   . Hypertension Paternal Grandmother   . Diabetes Paternal Grandmother    History  Substance Use Topics  . Smoking status: Current Every Day Smoker -- 0.50 packs/day for 42 years    Types: Cigarettes  . Smokeless tobacco: Never Used  . Alcohol Use: Yes     Comment: occasionally   OB History    No data available     Review of Systems  Genitourinary: Negative for bladder incontinence.  Musculoskeletal: Positive for myalgias, back pain, arthralgias, neck pain and neck stiffness.  Neurological: Positive for headaches. Negative for dizziness, tingling, weakness, numbness and paresthesias.  All other systems reviewed and are negative.     Allergies  Carbamazepine; Clonazepam; Divalproex sodium; Diphenhydramine hcl; Tiotropium bromide monohydrate; and Vicodin  Home Medications   Prior to Admission medications   Medication Sig Start Date End Date Taking? Authorizing Provider  ALPRAZolam Duanne Moron) 1 MG tablet Take 1 tablet (1 mg total) by mouth 3 (three) times daily as needed for anxiety. 02/28/15   Venia Carbon, MD  cyclobenzaprine (FLEXERIL) 10 MG tablet Take 1 tablet (10 mg total) by mouth every 8 (eight) hours as needed for muscle spasms. 04/25/15 04/24/16  Corliss Parish, PA-C  gabapentin (NEURONTIN) 300 MG capsule Take 1 Capsule Twice Daily and Take 2 Capsules At Bedtime. Patient taking differently: Take 1,200 mg by mouth at bedtime.  08/09/14   Venia Carbon, MD  HYDROmorphone (DILAUDID) 2 MG tablet Take 1 tablet (2 mg total) by mouth every 4 (four) hours as needed for  severe pain. 11/23/14   Marchia Bond, MD  lamoTRIgine (LAMICTAL) 100 MG tablet TAKE 1 TABLET TWICE DAILY 10/17/14   Venia Carbon, MD  losartan-hydrochlorothiazide [facility]) 100-12.5 MG per tablet TAKE 1 TABLET EVERY DAY 05/14/14   Venia Carbon, MD  naproxen (NAPROSYN) 500 MG tablet Take 1 tablet (500 mg total) by mouth 2 (two) times daily with a meal. 04/25/15 04/24/16  Shayne Alken V, PA-C  omeprazole (PRILOSEC) 20 MG capsule TAKE 1 CAPSULE EVERY DAY 04/19/14   Venia Carbon, MD  oxyCODONE-acetaminophen (PERCOCET) 7.5-325 MG per tablet Take 1 tablet by mouth daily as needed.  01/17/15  Historical Provider, MD  PARoxetine (PAXIL) 20 MG tablet TAKE 1 TABLET EVERY MORNING 04/09/15   Venia Carbon, MD   BP 138/89 mmHg  Pulse 74  Temp(Src) 98.5 F (36.9 C) (Oral)  Resp 20  Ht 5' (1.524 m)  Wt 100 lb (45.36 kg)  BMI 19.53 kg/m2  SpO2 97% Physical Exam  Constitutional: She is oriented to person, place, and time. Vital signs are normal. She appears well-developed and well-nourished. She is active.  Non-toxic appearance. She does not have a sickly appearance. She does not appear ill.  HENT:  Head: Normocephalic and atraumatic.  Eyes: Conjunctivae and EOM are normal. Pupils are equal, round, and reactive to light.  Neck: Neck supple. Spinous process tenderness and muscular tenderness present. No rigidity.    Limited left and right lateral rotation due to pain. Full flexion and extension of the neck intact. Lower cervical spine tender to palpation  Musculoskeletal: Normal range of motion. She exhibits tenderness.       Thoracic back: She exhibits tenderness and bony tenderness. She exhibits normal range of motion.  Tender to palpation over upper thoracic spine Thoracic paraspinal muscles tender to palpation bilaterally   Neurological: She is alert and oriented to person, place, and time. She has normal strength. No sensory deficit. She exhibits normal muscle tone. Coordination and gait  normal.  Equal grip strength bilateral upper extremities 5/5  Skin: Skin is warm and dry.  Psychiatric: She has a normal mood and affect. Her behavior is normal. Judgment and thought content normal.  Nursing note and vitals reviewed.   ED Course  Procedures (including critical care time) Labs Review Labs Reviewed - No data to display  Imaging Review Dg Cervical Spine Complete  04/25/2015   CLINICAL DATA:  Woke up this morning with stiff neck, shoulder pain, neck pain  EXAM: CERVICAL SPINE  4+ VIEWS  COMPARISON:  06/26/2011  FINDINGS: Six views of cervical spine submitted. No acute fracture or subluxation. Again noted anterior fixation with metallic plate and screws at M8-U1 level. There is moderate anterior spurring lower endplate of C3 vertebral body. Mild anterior spurring lower endplate of C2 vertebral body. Degenerative changes C1-C2 articulation. There is mild disc space flattening with minimal anterior spurring at C7-T1 level. No prevertebral soft tissue swelling. Multilevel mild facet degenerative changes. There is mild narrowing of right neural foramina at C3-C4 C4-C5 and C6-C7 level. Mild narrowing of left neural foramina at C4-C5 and C6-C7 level.  IMPRESSION: No acute fracture or subluxation. Anterior metallic fixation with plate and screws at L2-G4 level. Alignment is preserved. Degenerative changes as described above. No prevertebral soft tissue swelling.   Electronically Signed   By: Lahoma Crocker M.D.   On: 04/25/2015 17:10   Dg Thoracic Spine 2 View  04/25/2015   CLINICAL DATA:  Neck pain, woke up this morning with stiff neck, bilateral shoulder pain  EXAM: THORACIC SPINE - 2 VIEW  COMPARISON:  03/30/2008  FINDINGS: Three views of thoracic spine submitted. No acute fracture or subluxation. There is dextroscoliosis mid thoracic spine. There is levoscoliosis lower thoracic and upper lumbar spine. Partially visualized metallic fixation material lower lumbar spine.  IMPRESSION: No acute  fracture or subluxation.  S-shaped thoracolumbar scoliosis.   Electronically Signed   By: Lahoma Crocker M.D.   On: 04/25/2015 17:12     EKG Interpretation None      I reviewed the cervical and thoracic spine XRAYs listed above.  MDM  Reviewed controlled substances Registry patient last  had Percocet prescription filled on April 25 norFlex 60 mg IM ordered in ER I will write patient for naproxen 500mg  BID and Flexeril TID as needed for muscle spasm. She should follow up with her neurosurgeon or primary care doctor for chronic pain issues.  Final diagnoses:  Cervical spine degeneration  Degenerative arthritis of thoracic spine        Corliss Parish, PA-C 04/25/15 [place], MD 04/25/15 2221

## 2015-04-25 NOTE — ED Notes (Signed)
Pt has neck pain.  No recent injury.  Pt reports she had neck surgery 3 years ago.  States pain worse today

## 2015-04-25 NOTE — ED Notes (Signed)
Pt reports that she woke up this am with a stiff neck. She states that the pain radiates up her head giving her a headache and down into her shoulders. She states that it hurts to turn her neck. Pt is moving neck very slowly.

## 2015-04-25 NOTE — ED Notes (Signed)
AAOx3.  Skin warm and dry.  NAD 

## 2015-04-26 ENCOUNTER — Telehealth: Payer: Self-pay | Admitting: *Deleted

## 2015-04-26 NOTE — Telephone Encounter (Signed)
Spoke with patient and she's doing 50% better than yesterday. She will call when she needs follow-up appt

## 2015-04-30 ENCOUNTER — Other Ambulatory Visit: Payer: Self-pay | Admitting: Internal Medicine

## 2015-05-26 ENCOUNTER — Other Ambulatory Visit: Payer: Self-pay | Admitting: Internal Medicine

## 2015-05-30 ENCOUNTER — Other Ambulatory Visit: Payer: Self-pay | Admitting: Internal Medicine

## 2015-06-12 DIAGNOSIS — M799 Soft tissue disorder, unspecified: Secondary | ICD-10-CM | POA: Diagnosis not present

## 2015-06-17 ENCOUNTER — Other Ambulatory Visit: Payer: Self-pay | Admitting: Internal Medicine

## 2015-06-17 ENCOUNTER — Other Ambulatory Visit: Payer: Self-pay | Admitting: Orthopedic Surgery

## 2015-06-17 DIAGNOSIS — Z01818 Encounter for other preprocedural examination: Secondary | ICD-10-CM

## 2015-06-17 DIAGNOSIS — M19012 Primary osteoarthritis, left shoulder: Secondary | ICD-10-CM | POA: Diagnosis not present

## 2015-06-17 NOTE — Telephone Encounter (Signed)
rx faxed to pharmacy manually  

## 2015-06-17 NOTE — Telephone Encounter (Signed)
Approved: okay #[id number] x 0

## 2015-06-17 NOTE — Telephone Encounter (Signed)
02/28/2015 

## 2015-06-18 ENCOUNTER — Other Ambulatory Visit: Payer: Self-pay | Admitting: Orthopedic Surgery

## 2015-06-20 ENCOUNTER — Ambulatory Visit
Admission: RE | Admit: 2015-06-20 | Discharge: 2015-06-20 | Disposition: A | Discharge status: Home / Self Care | Payer: Medicare PPO | Source: Ambulatory Visit | Attending: Orthopedic Surgery | Admitting: Orthopedic Surgery

## 2015-06-20 DIAGNOSIS — M19012 Primary osteoarthritis, left shoulder: Secondary | ICD-10-CM | POA: Diagnosis not present

## 2015-06-20 DIAGNOSIS — M13812 Other specified arthritis, left shoulder: Secondary | ICD-10-CM | POA: Diagnosis not present

## 2015-06-20 DIAGNOSIS — Z01818 Encounter for other preprocedural examination: Secondary | ICD-10-CM

## 2015-06-28 ENCOUNTER — Encounter (HOSPITAL_BASED_OUTPATIENT_CLINIC_OR_DEPARTMENT_OTHER)
Admission: RE | Admit: 2015-06-28 | Discharge: 2015-06-28 | Disposition: A | Discharge status: Home / Self Care | Payer: Medicare PPO | Source: Ambulatory Visit | Attending: Orthopedic Surgery | Admitting: Orthopedic Surgery

## 2015-06-28 ENCOUNTER — Encounter (HOSPITAL_BASED_OUTPATIENT_CLINIC_OR_DEPARTMENT_OTHER): Payer: Self-pay | Admitting: *Deleted

## 2015-06-28 ENCOUNTER — Other Ambulatory Visit: Payer: Self-pay

## 2015-06-28 DIAGNOSIS — Z8049 Family history of malignant neoplasm of other genital organs: Secondary | ICD-10-CM | POA: Diagnosis not present

## 2015-06-28 DIAGNOSIS — Z01818 Encounter for other preprocedural examination: Secondary | ICD-10-CM | POA: Insufficient documentation

## 2015-06-28 DIAGNOSIS — M19012 Primary osteoarthritis, left shoulder: Secondary | ICD-10-CM | POA: Insufficient documentation

## 2015-06-28 DIAGNOSIS — Z8711 Personal history of peptic ulcer disease: Secondary | ICD-10-CM | POA: Diagnosis not present

## 2015-06-28 DIAGNOSIS — Z833 Family history of diabetes mellitus: Secondary | ICD-10-CM | POA: Diagnosis not present

## 2015-06-28 DIAGNOSIS — Z90710 Acquired absence of both cervix and uterus: Secondary | ICD-10-CM | POA: Diagnosis not present

## 2015-06-28 DIAGNOSIS — Z825 Family history of asthma and other chronic lower respiratory diseases: Secondary | ICD-10-CM | POA: Diagnosis not present

## 2015-06-28 DIAGNOSIS — Z8261 Family history of arthritis: Secondary | ICD-10-CM | POA: Diagnosis not present

## 2015-06-28 DIAGNOSIS — Z800 Family history of malignant neoplasm of digestive organs: Secondary | ICD-10-CM | POA: Diagnosis not present

## 2015-06-28 DIAGNOSIS — F329 Major depressive disorder, single episode, unspecified: Secondary | ICD-10-CM | POA: Diagnosis not present

## 2015-06-28 DIAGNOSIS — K219 Gastro-esophageal reflux disease without esophagitis: Secondary | ICD-10-CM | POA: Diagnosis not present

## 2015-06-28 DIAGNOSIS — Z885 Allergy status to narcotic agent status: Secondary | ICD-10-CM | POA: Diagnosis not present

## 2015-06-28 DIAGNOSIS — Z888 Allergy status to other drugs, medicaments and biological substances status: Secondary | ICD-10-CM | POA: Diagnosis not present

## 2015-06-28 DIAGNOSIS — Z981 Arthrodesis status: Secondary | ICD-10-CM | POA: Diagnosis not present

## 2015-06-28 DIAGNOSIS — R413 Other amnesia: Secondary | ICD-10-CM | POA: Diagnosis not present

## 2015-06-28 DIAGNOSIS — F419 Anxiety disorder, unspecified: Secondary | ICD-10-CM | POA: Diagnosis not present

## 2015-06-28 DIAGNOSIS — J449 Chronic obstructive pulmonary disease, unspecified: Secondary | ICD-10-CM | POA: Diagnosis not present

## 2015-06-28 DIAGNOSIS — Z8249 Family history of ischemic heart disease and other diseases of the circulatory system: Secondary | ICD-10-CM | POA: Diagnosis not present

## 2015-06-28 DIAGNOSIS — M797 Fibromyalgia: Secondary | ICD-10-CM | POA: Diagnosis not present

## 2015-06-28 DIAGNOSIS — Z79899 Other long term (current) drug therapy: Secondary | ICD-10-CM | POA: Diagnosis not present

## 2015-06-28 DIAGNOSIS — I340 Nonrheumatic mitral (valve) insufficiency: Secondary | ICD-10-CM | POA: Diagnosis not present

## 2015-06-28 DIAGNOSIS — F17210 Nicotine dependence, cigarettes, uncomplicated: Secondary | ICD-10-CM | POA: Diagnosis not present

## 2015-06-28 DIAGNOSIS — I10 Essential (primary) hypertension: Secondary | ICD-10-CM | POA: Diagnosis not present

## 2015-06-28 DIAGNOSIS — K589 Irritable bowel syndrome without diarrhea: Secondary | ICD-10-CM | POA: Diagnosis not present

## 2015-06-28 LAB — BASIC METABOLIC PANEL
ANION GAP: 11 (ref 5–15)
BUN: 9 mg/dL (ref 6–20)
CALCIUM: 9.6 mg/dL (ref 8.9–10.3)
CO2: 28 mmol/L (ref 22–32)
Chloride: 100 mmol/L — ABNORMAL LOW (ref 101–111)
Creatinine, Ser: 0.97 mg/dL (ref 0.44–1.00)
GFR calc Af Amer: >60 mL/min (ref >60)
Glucose, Bld: 107 mg/dL — ABNORMAL HIGH (ref 65–99)
POTASSIUM: 3.2 mmol/L — AB (ref 3.5–5.1)
SODIUM: 139 mmol/L (ref 135–145)

## 2015-06-28 LAB — SURGICAL PCR SCREEN
MRSA, PCR: NEGATIVE
Staphylococcus aureus: POSITIVE — AB

## 2015-06-28 NOTE — Progress Notes (Signed)
PCR screen result negative for MRSA, postive for Staph.  Pt called and notified to use Mupiricin as directed when she was here for her pre op appt.  Potassium 3.2, will ask Anesthesia if they want to repeat dos.

## 2015-07-05 ENCOUNTER — Encounter (HOSPITAL_BASED_OUTPATIENT_CLINIC_OR_DEPARTMENT_OTHER)
Admission: RE | Disposition: A | Discharge status: Home / Self Care | Payer: Self-pay | Source: Ambulatory Visit | Attending: Orthopedic Surgery

## 2015-07-05 ENCOUNTER — Ambulatory Visit (HOSPITAL_COMMUNITY): Payer: Medicare PPO

## 2015-07-05 ENCOUNTER — Ambulatory Visit (HOSPITAL_BASED_OUTPATIENT_CLINIC_OR_DEPARTMENT_OTHER)
Admission: RE | Admit: 2015-07-05 | Discharge: 2015-07-06 | Disposition: A | Discharge status: Home / Self Care | Payer: Medicare PPO | Source: Ambulatory Visit | Attending: Orthopedic Surgery | Admitting: Orthopedic Surgery

## 2015-07-05 ENCOUNTER — Ambulatory Visit (HOSPITAL_BASED_OUTPATIENT_CLINIC_OR_DEPARTMENT_OTHER): Payer: Medicare PPO | Admitting: Anesthesiology

## 2015-07-05 ENCOUNTER — Encounter (HOSPITAL_BASED_OUTPATIENT_CLINIC_OR_DEPARTMENT_OTHER): Payer: Self-pay | Admitting: Anesthesiology

## 2015-07-05 DIAGNOSIS — F329 Major depressive disorder, single episode, unspecified: Secondary | ICD-10-CM | POA: Diagnosis not present

## 2015-07-05 DIAGNOSIS — Z8261 Family history of arthritis: Secondary | ICD-10-CM | POA: Insufficient documentation

## 2015-07-05 DIAGNOSIS — M19012 Primary osteoarthritis, left shoulder: Secondary | ICD-10-CM | POA: Diagnosis present

## 2015-07-05 DIAGNOSIS — Z8049 Family history of malignant neoplasm of other genital organs: Secondary | ICD-10-CM | POA: Insufficient documentation

## 2015-07-05 DIAGNOSIS — Z885 Allergy status to narcotic agent status: Secondary | ICD-10-CM | POA: Insufficient documentation

## 2015-07-05 DIAGNOSIS — Z96612 Presence of left artificial shoulder joint: Secondary | ICD-10-CM

## 2015-07-05 DIAGNOSIS — F17200 Nicotine dependence, unspecified, uncomplicated: Secondary | ICD-10-CM | POA: Diagnosis not present

## 2015-07-05 DIAGNOSIS — Z471 Aftercare following joint replacement surgery: Secondary | ICD-10-CM | POA: Diagnosis not present

## 2015-07-05 DIAGNOSIS — K589 Irritable bowel syndrome without diarrhea: Secondary | ICD-10-CM | POA: Insufficient documentation

## 2015-07-05 DIAGNOSIS — I340 Nonrheumatic mitral (valve) insufficiency: Secondary | ICD-10-CM | POA: Insufficient documentation

## 2015-07-05 DIAGNOSIS — R413 Other amnesia: Secondary | ICD-10-CM | POA: Insufficient documentation

## 2015-07-05 DIAGNOSIS — J449 Chronic obstructive pulmonary disease, unspecified: Secondary | ICD-10-CM | POA: Diagnosis not present

## 2015-07-05 DIAGNOSIS — M797 Fibromyalgia: Secondary | ICD-10-CM | POA: Insufficient documentation

## 2015-07-05 DIAGNOSIS — Z90710 Acquired absence of both cervix and uterus: Secondary | ICD-10-CM | POA: Insufficient documentation

## 2015-07-05 DIAGNOSIS — Z825 Family history of asthma and other chronic lower respiratory diseases: Secondary | ICD-10-CM | POA: Insufficient documentation

## 2015-07-05 DIAGNOSIS — Z888 Allergy status to other drugs, medicaments and biological substances status: Secondary | ICD-10-CM | POA: Insufficient documentation

## 2015-07-05 DIAGNOSIS — F419 Anxiety disorder, unspecified: Secondary | ICD-10-CM | POA: Insufficient documentation

## 2015-07-05 DIAGNOSIS — Z800 Family history of malignant neoplasm of digestive organs: Secondary | ICD-10-CM | POA: Insufficient documentation

## 2015-07-05 DIAGNOSIS — Z8711 Personal history of peptic ulcer disease: Secondary | ICD-10-CM | POA: Insufficient documentation

## 2015-07-05 DIAGNOSIS — Z833 Family history of diabetes mellitus: Secondary | ICD-10-CM | POA: Insufficient documentation

## 2015-07-05 DIAGNOSIS — Z79899 Other long term (current) drug therapy: Secondary | ICD-10-CM | POA: Insufficient documentation

## 2015-07-05 DIAGNOSIS — Z8249 Family history of ischemic heart disease and other diseases of the circulatory system: Secondary | ICD-10-CM | POA: Insufficient documentation

## 2015-07-05 DIAGNOSIS — Z981 Arthrodesis status: Secondary | ICD-10-CM | POA: Insufficient documentation

## 2015-07-05 DIAGNOSIS — F17210 Nicotine dependence, cigarettes, uncomplicated: Secondary | ICD-10-CM | POA: Insufficient documentation

## 2015-07-05 DIAGNOSIS — I10 Essential (primary) hypertension: Secondary | ICD-10-CM | POA: Diagnosis not present

## 2015-07-05 DIAGNOSIS — K219 Gastro-esophageal reflux disease without esophagitis: Secondary | ICD-10-CM | POA: Insufficient documentation

## 2015-07-05 HISTORY — DX: Primary osteoarthritis, left shoulder: M19.012

## 2015-07-05 HISTORY — PX: TOTAL SHOULDER ARTHROPLASTY: SHX126

## 2015-07-05 SURGERY — ARTHROPLASTY, SHOULDER, TOTAL
Anesthesia: Regional | Site: Shoulder | Laterality: Left

## 2015-07-05 MED ORDER — HYDROMORPHONE HCL 2 MG PO TABS
2.0000 mg | ORAL_TABLET | ORAL | Status: DC | PRN
Start: 2015-07-05 — End: 2016-01-01

## 2015-07-05 MED ORDER — LAMOTRIGINE 100 MG PO TABS
100.0000 mg | ORAL_TABLET | Freq: Two times a day (BID) | ORAL | Status: DC
  Administered 2015-07-05: 100 mg via ORAL
  Filled 2015-07-05: qty 1

## 2015-07-05 MED ORDER — FENTANYL CITRATE (PF) 100 MCG/2ML IJ SOLN
50.0000 ug | INTRAMUSCULAR | Status: AC | PRN
  Administered 2015-07-05 (×2): 25 ug via INTRAVENOUS
  Administered 2015-07-05: 100 ug via INTRAVENOUS

## 2015-07-05 MED ORDER — ZOLPIDEM TARTRATE 5 MG PO TABS
5.0000 mg | ORAL_TABLET | Freq: Every evening | ORAL | Status: DC | PRN
  Administered 2015-07-05: 5 mg via ORAL
  Filled 2015-07-05: qty 1

## 2015-07-05 MED ORDER — FENTANYL CITRATE (PF) 100 MCG/2ML IJ SOLN
INTRAMUSCULAR | Status: AC
  Filled 2015-07-05: qty 4

## 2015-07-05 MED ORDER — HYDROMORPHONE HCL 1 MG/ML IJ SOLN
0.2500 mg | INTRAMUSCULAR | Status: DC | PRN
  Administered 2015-07-05 (×4): 0.5 mg via INTRAVENOUS

## 2015-07-05 MED ORDER — MAGNESIUM CITRATE PO SOLN: 1.0000 | Freq: Once | ORAL | Status: DC | PRN

## 2015-07-05 MED ORDER — OXYCODONE-ACETAMINOPHEN 5-325 MG PO TABS: 1.0000 | ORAL_TABLET | ORAL | Status: DC | PRN

## 2015-07-05 MED ORDER — SUCCINYLCHOLINE CHLORIDE 20 MG/ML IJ SOLN
INTRAMUSCULAR | Status: DC | PRN
  Administered 2015-07-05: 100 mg via INTRAVENOUS

## 2015-07-05 MED ORDER — DEXAMETHASONE SODIUM PHOSPHATE 4 MG/ML IJ SOLN
INTRAMUSCULAR | Status: DC | PRN
  Administered 2015-07-05: 10 mg via INTRAVENOUS

## 2015-07-05 MED ORDER — LIDOCAINE HCL (CARDIAC) 20 MG/ML IV SOLN
INTRAVENOUS | Status: DC | PRN
  Administered 2015-07-05: 60 mg via INTRAVENOUS

## 2015-07-05 MED ORDER — POLYETHYLENE GLYCOL 3350 17 G PO PACK: 17.0000 g | PACK | Freq: Every day | ORAL | Status: DC | PRN

## 2015-07-05 MED ORDER — OXYCODONE HCL 5 MG/5ML PO SOLN: 5.0000 mg | Freq: Once | ORAL | Status: DC | PRN

## 2015-07-05 MED ORDER — LIDOCAINE HCL 4 % MT SOLN
OROMUCOSAL | Status: DC | PRN
  Administered 2015-07-05: 2 mL via TOPICAL

## 2015-07-05 MED ORDER — HYDROMORPHONE HCL 1 MG/ML IJ SOLN
INTRAMUSCULAR | Status: AC
Start: 2015-07-05 — End: 2015-07-05
  Filled 2015-07-05: qty 1

## 2015-07-05 MED ORDER — ONDANSETRON HCL 4 MG/2ML IJ SOLN
INTRAMUSCULAR | Status: DC | PRN
  Administered 2015-07-05: 4 mg via INTRAVENOUS

## 2015-07-05 MED ORDER — MIDAZOLAM HCL 2 MG/2ML IJ SOLN
INTRAMUSCULAR | Status: AC
  Filled 2015-07-05: qty 2

## 2015-07-05 MED ORDER — ONDANSETRON HCL 4 MG/2ML IJ SOLN: 4.0000 mg | Freq: Four times a day (QID) | INTRAMUSCULAR | Status: DC | PRN

## 2015-07-05 MED ORDER — KETOROLAC TROMETHAMINE 15 MG/ML IJ SOLN
30.0000 mg | Freq: Once | INTRAMUSCULAR | Status: AC
  Administered 2015-07-05: 30 mg via INTRAVENOUS

## 2015-07-05 MED ORDER — ALPRAZOLAM 0.25 MG PO TABS
1.0000 mg | ORAL_TABLET | Freq: Three times a day (TID) | ORAL | Status: DC | PRN
Start: 2015-07-05 — End: 2015-07-06
  Filled 2015-07-05: qty 4

## 2015-07-05 MED ORDER — HYDROMORPHONE HCL 1 MG/ML IJ SOLN
INTRAMUSCULAR | Status: AC
  Filled 2015-07-05: qty 1

## 2015-07-05 MED ORDER — PROPOFOL 500 MG/50ML IV EMUL
INTRAVENOUS | Status: AC
  Filled 2015-07-05: qty 50

## 2015-07-05 MED ORDER — OXYCODONE HCL 5 MG PO TABS: 5.0000 mg | ORAL_TABLET | Freq: Once | ORAL | Status: DC | PRN

## 2015-07-05 MED ORDER — LACTATED RINGERS IV SOLN
INTRAVENOUS | Status: DC
  Administered 2015-07-05 (×3): via INTRAVENOUS

## 2015-07-05 MED ORDER — METOCLOPRAMIDE HCL 5 MG/ML IJ SOLN: 5.0000 mg | Freq: Three times a day (TID) | INTRAMUSCULAR | Status: DC | PRN

## 2015-07-05 MED ORDER — ONDANSETRON HCL 4 MG PO TABS: 4.0000 mg | ORAL_TABLET | Freq: Four times a day (QID) | ORAL | Status: DC | PRN

## 2015-07-05 MED ORDER — BISACODYL 10 MG RE SUPP: 10.0000 mg | Freq: Every day | RECTAL | Status: DC | PRN

## 2015-07-05 MED ORDER — HYDROMORPHONE HCL 1 MG/ML IJ SOLN
0.5000 mg | INTRAMUSCULAR | Status: DC | PRN
  Administered 2015-07-05 – 2015-07-06 (×3): 1 mg via INTRAVENOUS
  Filled 2015-07-05 (×4): qty 1

## 2015-07-05 MED ORDER — SODIUM CHLORIDE 0.9 % IV SOLN
INTRAVENOUS | Status: DC | PRN
  Administered 2015-07-05: 1000 mL

## 2015-07-05 MED ORDER — SENNA 8.6 MG PO TABS: 1.0000 | ORAL_TABLET | Freq: Two times a day (BID) | ORAL | Status: DC

## 2015-07-05 MED ORDER — ALPRAZOLAM 1 MG PO TABS: 1.0000 mg | ORAL_TABLET | Freq: Three times a day (TID) | ORAL | Status: DC | PRN

## 2015-07-05 MED ORDER — DOCUSATE SODIUM 100 MG PO CAPS: 100.0000 mg | ORAL_CAPSULE | Freq: Two times a day (BID) | ORAL | Status: DC

## 2015-07-05 MED ORDER — POTASSIUM CHLORIDE ER 10 MEQ PO TBCR: 10.0000 meq | EXTENDED_RELEASE_TABLET | Freq: Two times a day (BID) | ORAL | Status: DC

## 2015-07-05 MED ORDER — EPHEDRINE SULFATE 50 MG/ML IJ SOLN
INTRAMUSCULAR | Status: DC | PRN
  Administered 2015-07-05 (×2): 10 mg via INTRAVENOUS

## 2015-07-05 MED ORDER — PHENYLEPHRINE HCL 10 MG/ML IJ SOLN
INTRAMUSCULAR | Status: DC | PRN
  Administered 2015-07-05: 80 ug via INTRAVENOUS

## 2015-07-05 MED ORDER — PAROXETINE HCL 20 MG PO TABS: 20.0000 mg | ORAL_TABLET | Freq: Every morning | ORAL | Status: DC

## 2015-07-05 MED ORDER — FENTANYL CITRATE (PF) 100 MCG/2ML IJ SOLN
INTRAMUSCULAR | Status: AC
  Filled 2015-07-05: qty 2

## 2015-07-05 MED ORDER — METHOCARBAMOL 1000 MG/10ML IJ SOLN: 500.0000 mg | Freq: Four times a day (QID) | INTRAVENOUS | Status: DC | PRN

## 2015-07-05 MED ORDER — SCOPOLAMINE 1 MG/3DAYS TD PT72: 1.0000 | MEDICATED_PATCH | Freq: Once | TRANSDERMAL | Status: DC | PRN

## 2015-07-05 MED ORDER — LOSARTAN POTASSIUM-HCTZ 100-12.5 MG PO TABS: 1.0000 | ORAL_TABLET | Freq: Every day | ORAL | Status: DC

## 2015-07-05 MED ORDER — KETOROLAC TROMETHAMINE 30 MG/ML IJ SOLN
INTRAMUSCULAR | Status: AC
Start: 2015-07-05 — End: 2015-07-05
  Filled 2015-07-05: qty 1

## 2015-07-05 MED ORDER — SODIUM CHLORIDE 0.9 % IV SOLN
INTRAVENOUS | Status: DC
  Administered 2015-07-05: 16:00:00 via INTRAVENOUS

## 2015-07-05 MED ORDER — GABAPENTIN 300 MG PO CAPS
300.0000 mg | ORAL_CAPSULE | Freq: Three times a day (TID) | ORAL | Status: DC
  Administered 2015-07-05: 300 mg via ORAL
  Filled 2015-07-05 (×2): qty 1

## 2015-07-05 MED ORDER — PROPOFOL 10 MG/ML IV BOLUS
INTRAVENOUS | Status: DC | PRN
  Administered 2015-07-05: 20 mg via INTRAVENOUS
  Administered 2015-07-05: 150 mg via INTRAVENOUS

## 2015-07-05 MED ORDER — METHOCARBAMOL 500 MG PO TABS
500.0000 mg | ORAL_TABLET | Freq: Four times a day (QID) | ORAL | Status: DC | PRN
  Administered 2015-07-05 – 2015-07-06 (×2): 500 mg via ORAL
  Filled 2015-07-05 (×2): qty 1

## 2015-07-05 MED ORDER — CEFAZOLIN SODIUM 1-5 GM-% IV SOLN
1.0000 g | Freq: Four times a day (QID) | INTRAVENOUS | Status: AC
  Administered 2015-07-05 – 2015-07-06 (×3): 1 g via INTRAVENOUS
  Filled 2015-07-05 (×3): qty 50

## 2015-07-05 MED ORDER — SENNA-DOCUSATE SODIUM 8.6-50 MG PO TABS: 2.0000 | ORAL_TABLET | Freq: Every day | ORAL | Status: DC

## 2015-07-05 MED ORDER — CEFAZOLIN SODIUM-DEXTROSE 2-3 GM-% IV SOLR
2.0000 g | INTRAVENOUS | Status: AC
  Administered 2015-07-05: 2 g via INTRAVENOUS

## 2015-07-05 MED ORDER — MIDAZOLAM HCL 2 MG/2ML IJ SOLN
1.0000 mg | INTRAMUSCULAR | Status: DC | PRN
  Administered 2015-07-05: 2 mg via INTRAVENOUS

## 2015-07-05 MED ORDER — METOCLOPRAMIDE HCL 5 MG PO TABS: 5.0000 mg | ORAL_TABLET | Freq: Three times a day (TID) | ORAL | Status: DC | PRN

## 2015-07-05 MED ORDER — BACLOFEN 10 MG PO TABS: 10.0000 mg | ORAL_TABLET | Freq: Three times a day (TID) | ORAL | Status: DC

## 2015-07-05 MED ORDER — OXYCODONE HCL 5 MG PO TABS
5.0000 mg | ORAL_TABLET | ORAL | Status: DC | PRN
  Administered 2015-07-05: 10 mg via ORAL
  Administered 2015-07-05: 5 mg via ORAL
  Administered 2015-07-06: 10 mg via ORAL
  Filled 2015-07-05 (×3): qty 2

## 2015-07-05 MED ORDER — PANTOPRAZOLE SODIUM 40 MG PO TBEC: 80.0000 mg | DELAYED_RELEASE_TABLET | Freq: Every day | ORAL | Status: DC

## 2015-07-05 MED ORDER — GLYCOPYRROLATE 0.2 MG/ML IJ SOLN: 0.2000 mg | Freq: Once | INTRAMUSCULAR | Status: DC | PRN

## 2015-07-05 MED ORDER — CEFAZOLIN SODIUM-DEXTROSE 2-3 GM-% IV SOLR
INTRAVENOUS | Status: AC
  Filled 2015-07-05: qty 50

## 2015-07-05 MED ORDER — ONDANSETRON HCL 4 MG PO TABS: 4.0000 mg | ORAL_TABLET | Freq: Three times a day (TID) | ORAL | Status: DC | PRN

## 2015-07-05 MED ORDER — OXYCODONE-ACETAMINOPHEN 10-325 MG PO TABS: 1.0000 | ORAL_TABLET | Freq: Four times a day (QID) | ORAL | Status: DC | PRN

## 2015-07-05 SURGICAL SUPPLY — 81 items
BLADE HEX COATED 2.75 (ELECTRODE) ×2 IMPLANT
BLADE SAGITTAL 25.0X1.27X90 (BLADE) ×1 IMPLANT
BLADE SAW SAG 29X58X.64 (BLADE) ×1 IMPLANT
BLADE SURG 10 STRL SS (BLADE) ×2 IMPLANT
BLADE SURG 15 STRL LF DISP TIS (BLADE) ×2 IMPLANT
BLADE SURG 15 STRL SS (BLADE) ×4
BOWL SMART MIX CTS (DISPOSABLE) ×1 IMPLANT
CAPT SHLDR TOTAL 2 ×1 IMPLANT
CEMENT HV SMART SET (Cement) ×2 IMPLANT
CLEANER CAUTERY TIP 5X5 PAD (MISCELLANEOUS) IMPLANT
CLSR STERI-STRIP ANTIMIC 1/2X4 (GAUZE/BANDAGES/DRESSINGS) ×2 IMPLANT
COVER BACK TABLE 60X90IN (DRAPES) ×2 IMPLANT
COVER MAYO STAND STRL (DRAPES) ×2 IMPLANT
DECANTER SPIKE VIAL GLASS SM (MISCELLANEOUS) IMPLANT
DRAPE INCISE IOBAN 66X45 STRL (DRAPES) IMPLANT
DRAPE SURG 17X23 STRL (DRAPES) ×2 IMPLANT
DRAPE U 20/CS (DRAPES) ×2 IMPLANT
DRAPE U-SHAPE 47X51 STRL (DRAPES) ×2 IMPLANT
DRAPE U-SHAPE 76X120 STRL (DRAPES) ×4 IMPLANT
DRSG MEPILEX BORDER 4X8 (GAUZE/BANDAGES/DRESSINGS) ×2 IMPLANT
DURAPREP 26ML APPLICATOR (WOUND CARE) ×2 IMPLANT
ELECT BLADE 6.5 .24CM SHAFT (ELECTRODE) IMPLANT
ELECT REM PT RETURN 9FT ADLT (ELECTROSURGICAL) ×2
ELECTRODE REM PT RTRN 9FT ADLT (ELECTROSURGICAL) ×1 IMPLANT
FACESHIELD WRAPAROUND (MASK) ×4 IMPLANT
FACESHIELD WRAPAROUND OR TEAM (MASK) ×2 IMPLANT
GAUZE SPONGE 4X4 12PLY STRL (GAUZE/BANDAGES/DRESSINGS) IMPLANT
GLOVE BIO SURGEON STRL SZ7 (GLOVE) ×1 IMPLANT
GLOVE BIO SURGEON STRL SZ8 (GLOVE) ×2 IMPLANT
GLOVE BIOGEL PI IND STRL 7.0 (GLOVE) ×3 IMPLANT
GLOVE BIOGEL PI IND STRL 7.5 (GLOVE) IMPLANT
GLOVE BIOGEL PI IND STRL 8 (GLOVE) ×2 IMPLANT
GLOVE BIOGEL PI INDICATOR 7.0 (GLOVE) ×3
GLOVE BIOGEL PI INDICATOR 7.5 (GLOVE) ×1
GLOVE BIOGEL PI INDICATOR 8 (GLOVE) ×2
GLOVE ECLIPSE 6.5 STRL STRAW (GLOVE) ×1 IMPLANT
GLOVE ORTHO TXT STRL SZ7.5 (GLOVE) ×2 IMPLANT
GOWN STRL REUS W/ TWL LRG LVL3 (GOWN DISPOSABLE) ×1 IMPLANT
GOWN STRL REUS W/ TWL XL LVL3 (GOWN DISPOSABLE) ×2 IMPLANT
GOWN STRL REUS W/TWL LRG LVL3 (GOWN DISPOSABLE) ×4
GOWN STRL REUS W/TWL XL LVL3 (GOWN DISPOSABLE) ×4
HANDPIECE INTERPULSE COAX TIP (DISPOSABLE) ×2
MANIFOLD NEPTUNE II (INSTRUMENTS) ×1 IMPLANT
NS IRRIG 1000ML POUR BTL (IV SOLUTION) ×2 IMPLANT
PACK ARTHROSCOPY DSU (CUSTOM PROCEDURE TRAY) ×2 IMPLANT
PACK BASIN DAY SURGERY FS (CUSTOM PROCEDURE TRAY) ×2 IMPLANT
PAD CLEANER CAUTERY TIP 5X5 (MISCELLANEOUS)
PENCIL BUTTON HOLSTER BLD 10FT (ELECTRODE) ×2 IMPLANT
PIN STEINMANN THREADED TIP (PIN) IMPLANT
RETRIEVER SUT HEWSON (MISCELLANEOUS) IMPLANT
SET HNDPC FAN SPRY TIP SCT (DISPOSABLE) IMPLANT
SHEET MEDIUM DRAPE 40X70 STRL (DRAPES) ×2 IMPLANT
SLEEVE SCD COMPRESS KNEE MED (MISCELLANEOUS) ×2 IMPLANT
SLING ARM IMMOBILIZER LRG (SOFTGOODS) IMPLANT
SLING ARM IMMOBILIZER MED (SOFTGOODS) ×1 IMPLANT
SLING ARM LRG ADULT FOAM STRAP (SOFTGOODS) IMPLANT
SLING ARM MED ADULT FOAM STRAP (SOFTGOODS) IMPLANT
SLING ARM XL FOAM STRAP (SOFTGOODS) IMPLANT
SMARTMIX MINI TOWER (MISCELLANEOUS) ×2
SPONGE LAP 18X18 X RAY DECT (DISPOSABLE) ×2 IMPLANT
SPONGE LAP 4X18 X RAY DECT (DISPOSABLE) ×2 IMPLANT
STEINMAN PIN 9' 3.2MM ×1 IMPLANT
SUCTION FRAZIER TIP 10 FR DISP (SUCTIONS) ×2 IMPLANT
SUPPORT WRAP ARM LG (MISCELLANEOUS) ×2 IMPLANT
SUT FIBERWIRE #2 38 T-5 BLUE (SUTURE) ×12
SUT MNCRL AB 4-0 PS2 18 (SUTURE) IMPLANT
SUT VIC AB 0 CT1 18XCR BRD 8 (SUTURE) IMPLANT
SUT VIC AB 0 CT1 27 (SUTURE) ×2
SUT VIC AB 0 CT1 27XBRD ANBCTR (SUTURE) ×1 IMPLANT
SUT VIC AB 0 CT1 8-18 (SUTURE)
SUT VIC AB 2-0 SH 27 (SUTURE)
SUT VIC AB 2-0 SH 27XBRD (SUTURE) IMPLANT
SUT VICRYL 3-0 CR8 SH (SUTURE) ×2 IMPLANT
SUTURE FIBERWR #2 38 T-5 BLUE (SUTURE) ×3 IMPLANT
SYR BULB IRRIGATION 50ML (SYRINGE) ×2 IMPLANT
TAPE STRIPS DRAPE STRL (GAUZE/BANDAGES/DRESSINGS) IMPLANT
TOWEL OR 17X24 6PK STRL BLUE (TOWEL DISPOSABLE) ×4 IMPLANT
TOWEL OR NON WOVEN STRL DISP B (DISPOSABLE) ×4 IMPLANT
TOWER SMARTMIX MINI (MISCELLANEOUS) ×1 IMPLANT
TUBE CONNECTING 20X1/4 (TUBING) IMPLANT
YANKAUER SUCT BULB TIP NO VENT (SUCTIONS) ×2 IMPLANT

## 2015-07-05 NOTE — Progress Notes (Signed)
Assisted Dr. Hodierne with left, ultrasound guided, interscalene  block. Side rails up, monitors on throughout procedure. See vital signs in flow sheet. Tolerated Procedure well. 

## 2015-07-05 NOTE — Op Note (Addendum)
07/05/2015  1:50 PM  PATIENT:  Molly Peters    PRE-OPERATIVE DIAGNOSIS:  PRIMARY OSTEOARTHRISTIS LEFT SHOULDER  POST-OPERATIVE DIAGNOSIS:  Same  PROCEDURE:  LEFT TOTAL SHOULDER REPLACEMENT  SURGEON:  Johnny Bridge, MD  PHYSICIAN ASSISTANT: Joya Gaskins, OPA-C, present and scrubbed throughout the case, critical for completion in a timely fashion, and for retraction, instrumentation, and closure.  ANESTHESIA:   General  PREOPERATIVE INDICATIONS:  Molly Peters is a  54 y.o. female with a diagnosis of PRIMARY OSTEOARTHRISTIS LEFT SHOULDER who failed conservative measures and elected for surgical management.    The risks benefits and alternatives were discussed with the patient preoperatively including but not limited to the risks of infection, bleeding, nerve injury, cardiopulmonary complications, the need for revision surgery, dislocation, loosening, incomplete relief of pain, among others, and the patient was willing to proceed.   OPERATIVE IMPLANTS: Biomet size 9 mini press-fit humeral stem, size 42+18 Versa-dial humeral head, set in the E position with increased coverage posteriorly, with a small cemented glenoid polyethylene 3 peg implant with a central regenerex noncemented post.   OPERATIVE FINDINGS: Advanced glenohumeral osteoarthritis involving the glenoid and the humeral head with substantial osteophyte formation inferiorly. She had a fair amount of posterior glenoid bone loss, and it was challenging even to get the small implant to fit. I reamed eccentric lead to take more anterior bone to correct her version, and I had a very minimal amount of anterior vault left at the completion of reaming. The anterior inferior peg had a stop at the depths, but anteriorly marked the edge of the glenoid. The posterior and superior pegs were contained within the vault, and the central regenerate exposed was within the vault although the pin went bicortically. There was a very redundant  subscapularis tissue, that was easily able to be repaired without any significant tension.   OPERATIVE PROCEDURE: The patient was brought to the operating room and placed in the supine position. General anesthesia was administered. IV antibiotics were given.  The upper extremity was prepped and draped in usual sterile fashion. The patient was in a beachchair position with all bony prominences padded.   Time out was performed and a deltopectoral approach was carried out. The biceps tendon was tenodesed to the pectoralis tendon. The subscapularis was released, tagging it with a #2 Fiberwire, leaving a cuff of tendon for repair.   The inferior osteophyte was removed, and release of the capsule off of the humeral side was completed. The head was dislocated, and I reamed sequentially. I placed the humeral cutting guide at 30 of retroversion, and then pinned this into place, and made my humeral neck cut. This was at the appropriate level.   I then placed deep retractors and exposed the glenoid. I excised the labrum circumferentially, taking care to protect the axillary nerve inferiorly.   I then placed a guidewire into the center position, controlling appropriate version and inclination. I then reamed over the guidewire with the small reamer, and was satisfied with the preparation. I preserved the subchondral bone in order to maximize the strength and minimize the risk for subsequent subsidence.   I then drilled the central hole for the regenerex peg, and then placed the guide, and then drilled the 3 peripheral peg holes. I had excellent bony circumferential contact. The glenoid was extremely small, and it was challenging to maintain the angle, I had to ream off a fair amount anteriorly in order to get concentric flat face given her posterior bone loss.  I then cleaned the glenoid, irrigated it copiously, and then dried it and cemented the prosthesis into place. Excellent seating was achieved. I had full  exposure. The cement cured, and then I turned my attention to the humeral side.   I sequentially broached, up to the selected size, with the broach set at 30 of retroversion. I then placed the real stem. I trialed with multiple heads, and the above-named component was selected. Increased posterior coverage improved the coverage. The soft tissue tension was appropriate.   I then impacted the real humeral head into place, reduced the head, and irrigated copiously. Excellent stability and range of motion was achieved. I repaired the subscapularis with 5 #2 Fiberwire, as well as the rotator interval, and irrigated copiously once more. The subcutaneous tissue was closed with Vicryl including the deltopectoral fascia.   The skin was closed with Steri-Strips and sterile gauze was applied. She had a preoperative nerve block. She tolerated the procedure well and there were no complications.

## 2015-07-05 NOTE — Anesthesia Procedure Notes (Addendum)
Procedure Name: Intubation Date/Time: 07/05/2015 11:55 AM Performed by: Maryella Shivers Pre-anesthesia Checklist: Patient identified, Emergency Drugs available, Suction available and Patient being monitored Patient Re-evaluated:Patient Re-evaluated prior to inductionOxygen Delivery Method: Circle System Utilized Preoxygenation: Pre-oxygenation with 100% oxygen Intubation Type: IV induction Ventilation: Mask ventilation without difficulty Laryngoscope Size: Mac and 3 Grade View: Grade I Tube type: Oral Tube size: 7.0 mm Number of attempts: 1 Airway Equipment and Method: Stylet and Oral airway Placement Confirmation: ETT inserted through vocal cords under direct vision,  positive ETCO2 and breath sounds checked- equal and bilateral Secured at: 20 cm Tube secured with: Tape Dental Injury: Teeth and Oropharynx as per pre-operative assessment    Anesthesia Regional Block:  Interscalene brachial plexus block  Pre-Anesthetic Checklist: ,, timeout performed, Correct Patient, Correct Site, Correct Laterality, Correct Procedure, Correct Position, site marked, Risks and benefits discussed,  Surgical consent,  Pre-op evaluation,  At surgeon's request and post-op pain management  Laterality: Left  Prep: chloraprep       Needles:  Injection technique: Single-shot  Needle Type: Echogenic Stimulator Needle     Needle Length: 5cm 5 cm Needle Gauge: 22 and 22 G    Additional Needles:  Procedures: ultrasound guided (picture in chart) and nerve stimulator Interscalene brachial plexus block  Nerve Stimulator or Paresthesia:  Response: biceps flexion, 0.45 mA,   Additional Responses:   Narrative:  Start time: 07/05/2015 11:12 AM End time: 07/05/2015 11:23 AM Injection made incrementally with aspirations every 5 mL.  Performed by: Personally  Anesthesiologist: Judyann Casasola  Additional Notes: Functioning IV was confirmed and monitors were applied.  A 69mm 22ga Arrow echogenic  stimulator needle was used. Sterile prep and drape,hand hygiene and sterile gloves were used.  Negative aspiration and negative test dose prior to incremental administration of local anesthetic. 30 mL of 0.5% bupivacaine + epi was used. The patient tolerated the procedure well.  Ultrasound guidance: relevent anatomy identified, needle position confirmed, local anesthetic spread visualized around nerve(s), vascular puncture avoided.  Image printed for medical record.

## 2015-07-05 NOTE — Anesthesia Preprocedure Evaluation (Addendum)
Anesthesia Evaluation  Patient identified by MRN, date of birth, ID band Patient awake    Reviewed: Allergy & Precautions, NPO status , Patient's Chart, lab work & pertinent test results  Airway Mallampati: II   Neck ROM: full    Dental   Pulmonary COPDCurrent Smoker,  breath sounds clear to auscultation        Cardiovascular hypertension, Rhythm:regular Rate:Normal     Neuro/Psych Seizures -,  Anxiety Depression    GI/Hepatic PUD, GERD-  ,  Endo/Other    Renal/GU      Musculoskeletal  (+) Arthritis -, Fibromyalgia -  Abdominal   Peds  Hematology   Anesthesia Other Findings   Reproductive/Obstetrics                            Anesthesia Physical Anesthesia Plan  ASA: III  Anesthesia Plan: General and Regional   Post-op Pain Management:    Induction: Intravenous  Airway Management Planned: Oral ETT  Additional Equipment:   Intra-op Plan:   Post-operative Plan: Extubation in OR  Informed Consent: I have reviewed the patients History and Physical, chart, labs and discussed the procedure including the risks, benefits and alternatives for the proposed anesthesia with the patient or authorized representative who has indicated his/her understanding and acceptance.     Plan Discussed with: CRNA, Anesthesiologist and Surgeon  Anesthesia Plan Comments:         Anesthesia Quick Evaluation

## 2015-07-05 NOTE — Discharge Instructions (Signed)
Diet: As you were doing prior to hospitalization  ° °Shower:  May shower but keep the wounds dry, use an occlusive plastic wrap, NO SOAKING IN TUB.  If the bandage gets wet, change with a clean dry gauze.  If you have a splint on, leave the splint in place and keep the splint dry with a plastic bag. ° °Dressing:  You may change your dressing 3-5 days after surgery, unless you have a splint.  If you have a splint, then just leave the splint in place and we will change your bandages during your first follow-up appointment.   ° °If you had hand or foot surgery, we will plan to remove your stitches in about 2 weeks in the office.  For all other surgeries, there are sticky tapes (steri-strips) on your wounds and all the stitches are absorbable.  Leave the steri-strips in place when changing your dressings, they will peel off with time, usually 2-3 weeks. ° °Activity:  Increase activity slowly as tolerated, but follow the weight bearing instructions below.  The rules on driving is that you can not be taking narcotics while you drive, and you must feel in control of the vehicle.   ° °Weight Bearing:   Sling at all times. .   ° °To prevent constipation: you may use a stool softener such as - ° °Colace (over the counter) 100 mg by mouth twice a day  °Drink plenty of fluids (prune juice may be helpful) and high fiber foods °Miralax (over the counter) for constipation as needed.   ° °Itching:  If you experience itching with your medications, try taking only a single pain pill, or even half a pain pill at a time.  You may take up to 10 pain pills per day, and you can also use benadryl over the counter for itching or also to help with sleep.  ° °Precautions:  If you experience chest pain or shortness of breath - call 911 immediately for transfer to the hospital emergency department!! ° °If you develop a fever greater that 101 F, purulent drainage from wound, increased redness or drainage from wound, or calf pain -- Call the  office at [phone]                                                °Follow- Up Appointment:  Please call for an appointment to be seen in 2 weeks [place] - [phone] ° ° ° °Post Anesthesia Home Care Instructions ° °Activity: °Get plenty of rest for the remainder of the day. A responsible adult should stay with you for 24 hours following the procedure.  °For the next 24 hours, DO NOT: °-Drive a car °-Operate machinery °-Drink alcoholic beverages °-Take any medication unless instructed by your physician °-Make any legal decisions or sign important papers. ° °Meals: °Start with liquid foods such as gelatin or soup. Progress to regular foods as tolerated. Avoid greasy, spicy, heavy foods. If nausea and/or vomiting occur, drink only clear liquids until the nausea and/or vomiting subsides. Call your physician if vomiting continues. ° °Special Instructions/Symptoms: °Your throat may feel dry or sore from the anesthesia or the breathing tube placed in your throat during surgery. If this causes discomfort, gargle with warm salt water. The discomfort should disappear within 24 hours. ° °If you had a scopolamine patch placed behind your ear for the   management of post- operative nausea and/or vomiting: ° °1. The medication in the patch is effective for 72 hours, after which it should be removed.  Wrap patch in a tissue and discard in the trash. Wash hands thoroughly with soap and water. °2. You may remove the patch earlier than 72 hours if you experience unpleasant side effects which may include dry mouth, dizziness or visual disturbances. °3. Avoid touching the patch. Wash your hands with soap and water after contact with the patch. °  °Regional Anesthesia Blocks ° °1. Numbness or the inability to move the "blocked" extremity may last from 3-48 hours after placement. The length of time depends on the medication injected and your individual response to the medication. If the numbness is not going away after 48 hours,  call your surgeon. ° °2. The extremity that is blocked will need to be protected until the numbness is gone and the  Strength has returned. Because you cannot feel it, you will need to take extra care to avoid injury. Because it may be weak, you may have difficulty moving it or using it. You may not know what position it is in without looking at it while the block is in effect. ° °3. For blocks in the legs and feet, returning to weight bearing and walking needs to be done carefully. You will need to wait until the numbness is entirely gone and the strength has returned. You should be able to move your leg and foot normally before you try and bear weight or walk. You will need someone to be with you when you first try to ensure you do not fall and possibly risk injury. ° °4. Bruising and tenderness at the needle site are common side effects and will resolve in a few days. ° °5. Persistent numbness or new problems with movement should be communicated to the surgeon or the [facility] ([phone])/ [facility] ([phone]). ° ° °

## 2015-07-05 NOTE — Transfer of Care (Signed)
Immediate Anesthesia Transfer of Care Note  Patient: ARRYN TERRONES  Procedure(s) Performed: Procedure(s): LEFT TOTAL SHOULDER REPLACEMENT (Left)  Patient Location: PACU  Anesthesia Type:GA combined with regional for post-op pain  Level of Consciousness: sedated  Airway & Oxygen Therapy: Patient Spontanous Breathing and Patient connected to face mask oxygen  Post-op Assessment: Report given to RN and Post -op Vital signs reviewed and stable  Post vital signs: Reviewed and stable  Last Vitals:  Filed Vitals:   07/05/15 1130  BP: 134/76  Pulse: 91  Temp:   Resp: 17    Complications: No apparent anesthesia complications

## 2015-07-05 NOTE — H&P (Signed)
PREOPERATIVE H&P  Chief Complaint: Left shoulder pain  HPI: Molly Peters is a 54 y.o. female who presents for preoperative history and physical with a diagnosis of PRIMARY LEFT SHOULDER OSTEOARTHRITIS. Symptoms are rated as moderate to severe, and have been worsening.  This is significantly impairing activities of daily living.  She has elected for surgical management. I have replaced her other shoulder, and she is done extremely well, and wishes to have the same procedure done on left side.  She has failed injections, activity modification, anti-inflammatories, and assistive devices.  Preoperative X-rays demonstrate end stage degenerative changes with osteophyte formation, loss of joint space, subchondral sclerosis.   Past Medical History  Diagnosis Date  . Depression   . GERD (gastroesophageal reflux disease)   . Anxiety   . Valvular heart disease     Initial workup a number of years ago at [facility].  Echo (10/2009) showed EF 60-65%,      normal LV size, moderate aortic insufficiency with a trileaflet aortic valve and normal aortic root size.  Mild      mitral regurgitation.   . Fibromyalgia   . IBS (irritable bowel syndrome)   . Seizures     last seizure 2010  . History of gastric ulcer   . Hypertension     states under control with med., has been on med. x 1-2 yr.  . Internal hemorrhoid     states has had intermittent bright red bleeding with BM (11/15/2014)  . COPD (chronic obstructive pulmonary disease)     states SOB with ADLs; no O2 use; able to speak in complete sentences without SOB(11/15/2014)  . Osteoarthritis of right shoulder 11/2014  . Family history of adverse reaction to anesthesia     pt's mother and sister have hx. of post-op N/V  . TMJ syndrome   . Limited joint range of motion     neck - s/p cervical fusion  . Wears dentures     lower  . Difficulty swallowing pills     s/p cervical fusion  . Short-term memory loss   . Localized primary osteoarthritis of right  shoulder region 11/23/2014   Past Surgical History  Procedure Laterality Date  . Abdominal hysterectomy      partial  . Esophagogastroduodenoscopy  09/28/2008  . Nm myoview ltd      Lexiscan myoview (10/2009): EF 77%, normal wall motion, normal perfusion.   Marland Kitchen Anterior cervical decomp/discectomy fusion  02/06/2011    C4-5, C5-6, C6-7  . Colonoscopy  09/28/2008  . Shoulder arthroscopy with debridement and bicep tendon repair Right 04/13/2014    Procedure: RIGHT SHOULDER ARTHROSCOPY WITH DEBRIDEMENT EXTENSIVE;  Surgeon: Johnny Bridge, MD;  Location: [place];  Service: Orthopedics;  Laterality: Right;  . Cesarean section      x 2  . Breast lumpectomy Bilateral 1989    x 3 - benign  . Laminectomy with posterior lateral arthrodesis level 3  12/06/2009    with synovial cyst resection L3-4 bilat.  . Cardiac catheterization  1995  . Laminectomy with posterior lateral arthrodesis level 3  11/17/2006    L4-5  . Hardware removal  11/17/2006    L5-S1  . Total shoulder arthroplasty Right 11/23/2014    Procedure: TOTAL RIGHT SHOULDER ARTHROPLASTY;  Surgeon: Johnny Bridge, MD;  Location: [place];  Service: Orthopedics;  Laterality: Right;   Social History   Social History  . Marital Status: Married    Spouse Name: N/A  .  Number of Children: 3  . Years of Education: N/A   Occupational History  . Disabled 2003    Social History Main Topics  . Smoking status: Current Every Day Smoker -- 1.50 packs/day for 42 years    Types: Cigarettes  . Smokeless tobacco: Never Used  . Alcohol Use: Yes     Comment: occasionally  . Drug Use: No  . Sexual Activity: Not Asked   Other Topics Concern  . None   Social History Narrative       Family History  Problem Relation Age of Onset  . Arthritis Mother   . Cervical cancer Mother   . Anesthesia problems Mother     post-op N/V  . Healthy Father   . Cancer Father     colon cancer  . Migraines Sister   . Cervical  cancer Sister   . Anesthesia problems Sister     post-op N/V  . Migraines Brother   . Asthma Daughter   . Coronary artery disease Maternal Grandmother   . Hypertension Maternal Grandmother   . Diabetes Maternal Grandmother   . Coronary artery disease Paternal Grandmother   . Hypertension Paternal Grandmother   . Diabetes Paternal Grandmother    Allergies  Allergen Reactions  . Carbamazepine Hives  . Clonazepam Other (See Comments)    HALLUCINATIONS  . Divalproex Sodium Swelling    HAIR FALLS OUT  . Diphenhydramine Hcl Other (See Comments)    UNKNOWN  . Tiotropium Bromide Monohydrate Other (See Comments)    CREATES YEAST IN THROAT  . Vicodin [Hydrocodone-Acetaminophen] Itching   Prior to Admission medications   Medication Sig Start Date End Date Taking? Authorizing Provider  ALPRAZolam Duanne Moron) 1 MG tablet TAKE 1 TABLET THREE TIMES DAILY AS NEEDED FOR ANXIETY 06/17/15  Yes Venia Carbon, MD  gabapentin (NEURONTIN) 300 MG capsule TAKE 1 CAPSULE TWICE DAILY AND TAKE 2 CAPSULES AT BEDTIME. 05/27/15  Yes Venia Carbon, MD  lamoTRIgine (LAMICTAL) 100 MG tablet TAKE 1 TABLET TWICE DAILY 10/17/14  Yes Venia Carbon, MD  losartan-hydrochlorothiazide [facility]) 100-12.5 MG per tablet TAKE 1 TABLET EVERY DAY 04/30/15  Yes Venia Carbon, MD  naproxen (NAPROSYN) 500 MG tablet Take 1 tablet (500 mg total) by mouth 2 (two) times daily with a meal. 04/25/15 04/24/16 Yes Emma Weavil V, PA-C  omeprazole (PRILOSEC) 20 MG capsule TAKE 1 CAPSULE EVERY DAY 05/30/15  Yes Venia Carbon, MD  PARoxetine (PAXIL) 20 MG tablet TAKE 1 TABLET EVERY MORNING 04/09/15  Yes Venia Carbon, MD  potassium chloride (K-DUR) 10 MEQ tablet Take 10 mEq by mouth 2 (two) times daily.   Yes Historical Provider, MD  oxyCODONE-acetaminophen (PERCOCET) 7.5-325 MG per tablet Take 1 tablet by mouth daily as needed.  01/17/15   Historical Provider, MD     Positive ROS: All other systems have been reviewed and were otherwise  negative with the exception of those mentioned in the HPI and as above.  Physical Exam: General: Alert, no acute distress Cardiovascular: No pedal edema Respiratory: No cyanosis, no use of accessory musculature GI: No organomegaly, abdomen is soft and non-tender Skin: No lesions in the area of chief complaint Neurologic: Sensation intact distally Psychiatric: Patient is competent for consent with normal mood and affect Lymphatic: No axillary or cervical lymphadenopathy  MUSCULOSKELETAL: Left shoulder active motion is 0-90 with external rotation to 20 and internal rotation to the sacral level with intact rotator cuff strength.  Assessment: PRIMARY LEFT SHOULDER OSTEOARTHRITIS  Plan:  Plan for Procedure(s): LEFT TOTAL SHOULDER REPLACEMENT  The risks benefits and alternatives were discussed with the patient including but not limited to the risks of nonoperative treatment, versus surgical intervention including infection, bleeding, nerve injury,  blood clots, cardiopulmonary complications, morbidity, mortality, among others, and they were willing to proceed.   Johnny Bridge, MD Cell [phone]   07/05/2015 11:31 AM

## 2015-07-05 NOTE — Anesthesia Postprocedure Evaluation (Signed)
  Anesthesia Post-op Note  Patient: Molly Peters  Procedure(s) Performed: Procedure(s): LEFT TOTAL SHOULDER REPLACEMENT (Left)  Patient Location: PACU  Anesthesia Type: General, Regional   Level of Consciousness: awake, alert  and oriented  Airway and Oxygen Therapy: Patient Spontanous Breathing  Post-op Pain: moderate  Post-op Assessment: Post-op Vital signs reviewed  Post-op Vital Signs: Reviewed  Last Vitals:  Filed Vitals:   07/05/15 1500  BP: 116/88  Pulse: 96  Temp:   Resp: 15    Complications: No apparent anesthesia complications

## 2015-07-06 DIAGNOSIS — M19012 Primary osteoarthritis, left shoulder: Secondary | ICD-10-CM | POA: Diagnosis not present

## 2015-07-08 ENCOUNTER — Encounter (HOSPITAL_BASED_OUTPATIENT_CLINIC_OR_DEPARTMENT_OTHER): Payer: Self-pay | Admitting: Orthopedic Surgery

## 2015-07-18 ENCOUNTER — Ambulatory Visit: Payer: Medicare PPO | Admitting: Cardiovascular Disease

## 2015-07-18 DIAGNOSIS — M19012 Primary osteoarthritis, left shoulder: Secondary | ICD-10-CM | POA: Diagnosis not present

## 2015-07-18 DIAGNOSIS — Z96611 Presence of right artificial shoulder joint: Secondary | ICD-10-CM | POA: Diagnosis not present

## 2015-07-24 DIAGNOSIS — L509 Urticaria, unspecified: Secondary | ICD-10-CM | POA: Diagnosis not present

## 2015-07-24 DIAGNOSIS — F17200 Nicotine dependence, unspecified, uncomplicated: Secondary | ICD-10-CM | POA: Diagnosis not present

## 2015-07-25 ENCOUNTER — Emergency Department
Admission: EM | Admit: 2015-07-25 | Discharge: 2015-07-25 | Disposition: A | Discharge status: Home / Self Care | Payer: Medicare PPO | Attending: Emergency Medicine | Admitting: Emergency Medicine

## 2015-07-25 ENCOUNTER — Encounter: Payer: Self-pay | Admitting: Emergency Medicine

## 2015-07-25 ENCOUNTER — Telehealth: Payer: Self-pay | Admitting: Internal Medicine

## 2015-07-25 DIAGNOSIS — L509 Urticaria, unspecified: Secondary | ICD-10-CM

## 2015-07-25 DIAGNOSIS — Z79899 Other long term (current) drug therapy: Secondary | ICD-10-CM | POA: Diagnosis not present

## 2015-07-25 DIAGNOSIS — I10 Essential (primary) hypertension: Secondary | ICD-10-CM | POA: Diagnosis not present

## 2015-07-25 DIAGNOSIS — Z720 Tobacco use: Secondary | ICD-10-CM | POA: Insufficient documentation

## 2015-07-25 DIAGNOSIS — R21 Rash and other nonspecific skin eruption: Secondary | ICD-10-CM | POA: Diagnosis present

## 2015-07-25 MED ORDER — METHYLPREDNISOLONE SODIUM SUCC 125 MG IJ SOLR
125.0000 mg | Freq: Once | INTRAMUSCULAR | Status: AC
  Administered 2015-07-25: 125 mg via INTRAMUSCULAR

## 2015-07-25 MED ORDER — PREDNISONE 10 MG PO TABS: 10.0000 mg | ORAL_TABLET | ORAL | Status: DC

## 2015-07-25 MED ORDER — METHYLPREDNISOLONE SODIUM SUCC 125 MG IJ SOLR
INTRAMUSCULAR | Status: AC
  Filled 2015-07-25: qty 2

## 2015-07-25 MED ORDER — TRIAMCINOLONE ACETONIDE 0.1 % EX CREA: 1.0000 "application " | TOPICAL_CREAM | Freq: Four times a day (QID) | CUTANEOUS | Status: DC

## 2015-07-25 NOTE — Telephone Encounter (Signed)
Reviewed ER notes Diagnosed with urticaria Please check on her tomorrow

## 2015-07-25 NOTE — Telephone Encounter (Signed)
Per encounter notes pt has gone to [facility] ED.

## 2015-07-25 NOTE — ED Notes (Signed)
States she developed hives 4 days ago

## 2015-07-25 NOTE — ED Provider Notes (Signed)
[facility] Emergency Department Provider Note  ____________________________________________  Time seen: Approximately 12:29 PM  I have reviewed the triage vital signs and the nursing notes.   HISTORY  Chief Complaint Urticaria    HPI Molly Peters is a 54 y.o. female who is complaining of generalized care area 4 days. She was seen at an urgent care yesterday and given triamcinolone and oral steroids. Patient states that she has taken medications as prescribed and symptoms are worsening rather than improving. Patient states that she is also using Benadryl with no relief. No facial involvement. No difficulty breathing or swallowing. No new medications or foods in the last several days. No known contacts with poison ivy or oak.   Past Medical History  Diagnosis Date  . Depression   . GERD (gastroesophageal reflux disease)   . Anxiety   . Valvular heart disease     Initial workup a number of years ago at [facility].  Echo (10/2009) showed EF 60-65%,      normal LV size, moderate aortic insufficiency with a trileaflet aortic valve and normal aortic root size.  Mild      mitral regurgitation.   . Fibromyalgia   . IBS (irritable bowel syndrome)   . Seizures     last seizure 2010  . History of gastric ulcer   . Hypertension     states under control with med., has been on med. x 1-2 yr.  . Internal hemorrhoid     states has had intermittent bright red bleeding with BM (11/15/2014)  . COPD (chronic obstructive pulmonary disease)     states SOB with ADLs; no O2 use; able to speak in complete sentences without SOB(11/15/2014)  . Osteoarthritis of right shoulder 11/2014  . Family history of adverse reaction to anesthesia     pt's mother and sister have hx. of post-op N/V  . TMJ syndrome   . Limited joint range of motion     neck - s/p cervical fusion  . Wears dentures     lower  . Difficulty swallowing pills     s/p cervical fusion  . Short-term memory loss   .  Localized primary osteoarthritis of right shoulder region 11/23/2014  . Osteoarthritis of left shoulder 07/05/2015    Patient Active Problem List   Diagnosis Date Noted  . Osteoarthritis of left shoulder 07/05/2015  . Status post total shoulder replacement 07/05/2015  . Localized primary osteoarthritis of right shoulder region 11/23/2014  . S/P shoulder replacement 11/23/2014  . Osteoarthritis of right shoulder 04/13/2014  . Preoperative cardiovascular examination 03/20/2014  . Routine general medical examination at a health care facility 04/05/2013  . Memory loss 04/05/2013  . CAROTID BRUIT, LEFT 01/02/2011  . RESTLESS LEG SYNDROME 10/21/2010  . HYPERTENSION 06/23/2010  . BACK PAIN, LUMBAR, WITH RADICULOPATHY 11/12/2009  . HYPERLIPIDEMIA-MIXED 10/23/2009  . VALVULAR HEART DISEASE 03/27/2009  . IRRITABLE BOWEL SYNDROME 11/13/2008  . FIBROMYALGIA 10/03/2007  . Generalized anxiety disorder 09/07/2007  . TOBACCO ABUSE 09/07/2007  . GLAUCOMA 09/07/2007  . HEMORRHOIDS 09/07/2007  . GASTRIC ULCER 09/07/2007  . DEPRESSION 08/08/2007  . COPD (chronic obstructive pulmonary disease) with chronic bronchitis 08/08/2007  . GERD 08/08/2007  . Seizure disorder 08/08/2007    Past Surgical History  Procedure Laterality Date  . Abdominal hysterectomy      partial  . Esophagogastroduodenoscopy  09/28/2008  . Nm myoview ltd      Lexiscan myoview (10/2009): EF 77%, normal wall motion, normal perfusion.   Marland Kitchen  Anterior cervical decomp/discectomy fusion  02/06/2011    C4-5, C5-6, C6-7  . Colonoscopy  09/28/2008  . Shoulder arthroscopy with debridement and bicep tendon repair Right 04/13/2014    Procedure: RIGHT SHOULDER ARTHROSCOPY WITH DEBRIDEMENT EXTENSIVE;  Surgeon: Johnny Bridge, MD;  Location: [place];  Service: Orthopedics;  Laterality: Right;  . Cesarean section      x 2  . Breast lumpectomy Bilateral 1989    x 3 - benign  . Laminectomy with posterior lateral arthrodesis  level 3  12/06/2009    with synovial cyst resection L3-4 bilat.  . Cardiac catheterization  1995  . Laminectomy with posterior lateral arthrodesis level 3  11/17/2006    L4-5  . Hardware removal  11/17/2006    L5-S1  . Total shoulder arthroplasty Right 11/23/2014    Procedure: TOTAL RIGHT SHOULDER ARTHROPLASTY;  Surgeon: Johnny Bridge, MD;  Location: [place];  Service: Orthopedics;  Laterality: Right;  . Total shoulder arthroplasty Left 07/05/2015    Procedure: LEFT TOTAL SHOULDER REPLACEMENT;  Surgeon: Marchia Bond, MD;  Location: [place];  Service: Orthopedics;  Laterality: Left;    Current Outpatient Rx  Name  Route  Sig  Dispense  Refill  . ALPRAZolam (XANAX) 1 MG tablet      TAKE 1 TABLET THREE TIMES DAILY AS NEEDED FOR ANXIETY   270 tablet   0   . baclofen (LIORESAL) 10 MG tablet   Oral   Take 1 tablet (10 mg total) by mouth 3 (three) times daily. As needed for muscle spasm   50 tablet   0   . gabapentin (NEURONTIN) 300 MG capsule      TAKE 1 CAPSULE TWICE DAILY AND TAKE 2 CAPSULES AT BEDTIME.   360 capsule   3   . HYDROmorphone (DILAUDID) 2 MG tablet   Oral   Take 1 tablet (2 mg total) by mouth every 4 (four) hours as needed for severe pain.   50 tablet   0   . lamoTRIgine (LAMICTAL) 100 MG tablet      TAKE 1 TABLET TWICE DAILY   180 tablet   3   . losartan-hydrochlorothiazide (HYZAAR) 100-12.5 MG per tablet      TAKE 1 TABLET EVERY DAY   90 tablet   3   . omeprazole (PRILOSEC) 20 MG capsule      TAKE 1 CAPSULE EVERY DAY   90 capsule   3   . ondansetron (ZOFRAN) 4 MG tablet   Oral   Take 1 tablet (4 mg total) by mouth every 8 (eight) hours as needed for nausea or vomiting.   30 tablet   0   . oxyCODONE-acetaminophen (PERCOCET) 10-325 MG per tablet   Oral   Take 1-2 tablets by mouth every 6 (six) hours as needed for pain. MAXIMUM TOTAL ACETAMINOPHEN DOSE IS 4000 MG PER DAY   50 tablet   0   . PARoxetine (PAXIL)  20 MG tablet      TAKE 1 TABLET EVERY MORNING   90 tablet   3   . potassium chloride (K-DUR) 10 MEQ tablet   Oral   Take 10 mEq by mouth 2 (two) times daily.         . predniSONE (DELTASONE) 10 MG tablet   Oral   Take 1 tablet (10 mg total) by mouth as directed. Take as 6, 5, 4, 3, 2, 1 taper   21 tablet   0   .  sennosides-docusate sodium (SENOKOT-S) 8.6-50 MG tablet   Oral   Take 2 tablets by mouth daily.   30 tablet   1   . triamcinolone cream (KENALOG) 0.1 %   Topical   Apply 1 application topically 4 (four) times daily. Do note use for longer than 1 week in duration   30 g   0     Allergies Carbamazepine; Clonazepam; Divalproex sodium; Diphenhydramine hcl; Tiotropium bromide monohydrate; and Vicodin  Family History  Problem Relation Age of Onset  . Arthritis Mother   . Cervical cancer Mother   . Anesthesia problems Mother     post-op N/V  . Healthy Father   . Cancer Father     colon cancer  . Migraines Sister   . Cervical cancer Sister   . Anesthesia problems Sister     post-op N/V  . Migraines Brother   . Asthma Daughter   . Coronary artery disease Maternal Grandmother   . Hypertension Maternal Grandmother   . Diabetes Maternal Grandmother   . Coronary artery disease Paternal Grandmother   . Hypertension Paternal Grandmother   . Diabetes Paternal Grandmother     Social History Social History  Substance Use Topics  . Smoking status: Current Every Day Smoker -- 1.50 packs/day for 42 years    Types: Cigarettes  . Smokeless tobacco: Never Used  . Alcohol Use: Yes     Comment: occasionally    Review of Systems Constitutional: No fever/chills Eyes: No visual changes. ENT: No sore throat. Cardiovascular: Denies chest pain. Respiratory: Denies shortness of breath. Gastrointestinal: No abdominal pain.  No nausea, no vomiting.  No diarrhea.  No constipation. Genitourinary: Negative for dysuria. Musculoskeletal: Negative for back pain. Skin:  Positive for urticaria Neurological: Negative for headaches, focal weakness or numbness.  10-point ROS otherwise negative.  ____________________________________________   PHYSICAL EXAM:  VITAL SIGNS: ED Triage Vitals  Enc Vitals Group     BP 07/25/15 1205 112/78 mmHg     Pulse Rate 07/25/15 1205 83     Resp 07/25/15 1205 18     Temp 07/25/15 1205 98.2 F (36.8 C)     Temp Source 07/25/15 1205 Oral     SpO2 07/25/15 1205 96 %     Weight 07/25/15 1203 100 lb (45.36 kg)     Height 07/25/15 1203 5\' 1"  (1.549 m)     Head Cir --      Peak Flow --      Pain Score 07/25/15 1204 2     Pain Loc --      Pain Edu? --      Excl. in [place]? --     Constitutional: Alert and oriented. Well appearing and in no acute distress. Eyes: Conjunctivae are normal. PERRL. EOMI. Head: Atraumatic. Nose: No congestion/rhinnorhea. Mouth/Throat: Mucous membranes are moist.  Oropharynx non-erythematous. Neck: No stridor Cardiovascular: Normal rate, regular rhythm. Grossly normal heart sounds.  Good peripheral circulation. Respiratory: Normal respiratory effort.  No retractions. Lungs CTAB. Gastrointestinal: Soft and nontender. No distention. No abdominal bruits. No CVA tenderness. Musculoskeletal: No lower extremity tenderness nor edema.  No joint effusions. Neurologic:  Normal speech and language. No gross focal neurologic deficits are appreciated. No gait instability. Skin:  Skin is warm, dry and intact. Wheals and urticaria to extremities  X 4, and anterior and posterior trunk. No facial lesions. Wheals are linear, raised, and erythematous Psychiatric: Mood and affect are normal. Speech and behavior are normal.  ____________________________________________   LABS (all labs ordered  are listed, but only abnormal results are displayed)  Labs Reviewed - No data to  display ____________________________________________  EKG   ____________________________________________  RADIOLOGY   ____________________________________________   PROCEDURES  Procedure(s) performed: None    ____________________________________________   INITIAL IMPRESSION / ASSESSMENT AND PLAN / ED COURSE  Pertinent labs & imaging results that were available during my care of the patient were reviewed by me and considered in my medical decision making (see chart for details).  Discussed treatment plan with patient. Giving injection today in ED to help with symptoms. Placing patient on stronger oral steroids and refilling triamcinolone. If symptoms persist, patient should seek care from dermatology for further testing to determine origin. If symptoms worsen return to Emergency Room. ____________________________________________   FINAL CLINICAL IMPRESSION(S) / ED DIAGNOSES  Final diagnoses:  Urticaria of unknown origin      Darletta Moll, PA-C 07/25/15 1306

## 2015-07-25 NOTE — Telephone Encounter (Signed)
Patient Name: Molly Peters  DOB: 10-May-1961    Initial Comment Caller states breaking out in hives everywhere   Nurse Assessment  Nurse: Raphael Gibney, RN, Vanita Ingles Date/Time (Eastern Time): 07/25/2015 10:59:23 AM  Confirm and document reason for call. If symptomatic, describe symptoms. ---Caller states she is breaking out in hives. Went to urgent care yesterday who prescribed methylprednisolone and cream. Itching is worse. She was advised to see PCP today if she is not better. Hives is everywhere. Her abd is solid reed. No fever.  Has the patient traveled out of the country within the last 30 days? ---No  Does the patient require triage? ---Yes  Related visit to physician within the last 2 weeks? ---Yes  Does the PT have any chronic conditions? (i.e. diabetes, asthma, etc.) ---No     Guidelines    Guideline Title Affirmed Question Affirmed Notes  Hives [1] Hives have become worse AND [2] taking oral steroids (e.g., prednisone) > 24 hours    Final Disposition User   See Physician within 24 Hours Stringer, RN, Vera    Comments  Pt states she has already spoken to someone about an appt and the earliest appt is at 1 pm and she can not wait that long. She is going to the ER at [facility] in [place].  Caller states her abd is solid red. She was advised see her PCP or go to the ER if hives were not better today.

## 2015-07-25 NOTE — Discharge Instructions (Signed)
Hives Hives are itchy, red, swollen areas of the skin. They can vary in size and location on your body. Hives can come and go for hours or several days (acute hives) or for several weeks (chronic hives). Hives do not spread from person to person (noncontagious). They may get worse with scratching, exercise, and emotional stress. CAUSES   Allergic reaction to food, additives, or drugs.  Infections, including the common cold.  Illness, such as vasculitis, lupus, or thyroid disease.  Exposure to sunlight, heat, or cold.  Exercise.  Stress.  Contact with chemicals. SYMPTOMS   Red or white swollen patches on the skin. The patches may change size, shape, and location quickly and repeatedly.  Itching.  Swelling of the hands, feet, and face. This may occur if hives develop deeper in the skin. DIAGNOSIS  Your caregiver can usually tell what is wrong by performing a physical exam. Skin or blood tests may also be done to determine the cause of your hives. In some cases, the cause cannot be determined. TREATMENT  Mild cases usually get better with medicines such as antihistamines. Severe cases may require an emergency epinephrine injection. If the cause of your hives is known, treatment includes avoiding that trigger.  HOME CARE INSTRUCTIONS   Avoid causes that trigger your hives.  Take antihistamines as directed by your caregiver to reduce the severity of your hives. Non-sedating or low-sedating antihistamines are usually recommended. Do not drive while taking an antihistamine.  Take any other medicines prescribed for itching as directed by your caregiver.  Wear loose-fitting clothing.  Keep all follow-up appointments as directed by your caregiver. SEEK MEDICAL CARE IF:   You have persistent or severe itching that is not relieved with medicine.  You have painful or swollen joints. SEEK IMMEDIATE MEDICAL CARE IF:   You have a fever.  Your tongue or lips are swollen.  You have  trouble breathing or swallowing.  You feel tightness in the throat or chest.  You have abdominal pain. These problems may be the first sign of a life-threatening allergic reaction. Call your local emergency services (911 in [place] MAKE SURE YOU:   Understand these instructions.  Will watch your condition.  Will get help right away if you are not doing well or get worse. Document Released: 10/26/2005 Document Revised: 10/31/2013 Document Reviewed: 01/19/2012 Exit[employer] Patient Information 2015 [employer]. This information is not intended to replace advice given to you by your health care provider. Make sure you discuss any questions you have with your health care provider.  

## 2015-07-26 NOTE — Telephone Encounter (Signed)
Spoke with patient and the itching is better and slowing down in places, she will call on Monday to let us know how she's doing if not any better.

## 2015-07-31 ENCOUNTER — Emergency Department
Admission: EM | Admit: 2015-07-31 | Discharge: 2015-07-31 | Disposition: A | Discharge status: Home / Self Care | Payer: Medicare PPO | Attending: Emergency Medicine | Admitting: Emergency Medicine

## 2015-07-31 ENCOUNTER — Encounter: Payer: Self-pay | Admitting: Emergency Medicine

## 2015-07-31 ENCOUNTER — Ambulatory Visit: Payer: Medicare PPO | Admitting: Internal Medicine

## 2015-07-31 DIAGNOSIS — G8929 Other chronic pain: Secondary | ICD-10-CM | POA: Insufficient documentation

## 2015-07-31 DIAGNOSIS — X58XXXD Exposure to other specified factors, subsequent encounter: Secondary | ICD-10-CM | POA: Insufficient documentation

## 2015-07-31 DIAGNOSIS — L509 Urticaria, unspecified: Secondary | ICD-10-CM

## 2015-07-31 DIAGNOSIS — Z7952 Long term (current) use of systemic steroids: Secondary | ICD-10-CM | POA: Diagnosis not present

## 2015-07-31 DIAGNOSIS — M549 Dorsalgia, unspecified: Secondary | ICD-10-CM | POA: Diagnosis not present

## 2015-07-31 DIAGNOSIS — Z720 Tobacco use: Secondary | ICD-10-CM | POA: Diagnosis not present

## 2015-07-31 DIAGNOSIS — T7840XD Allergy, unspecified, subsequent encounter: Secondary | ICD-10-CM | POA: Diagnosis not present

## 2015-07-31 DIAGNOSIS — I10 Essential (primary) hypertension: Secondary | ICD-10-CM | POA: Diagnosis not present

## 2015-07-31 DIAGNOSIS — J441 Chronic obstructive pulmonary disease with (acute) exacerbation: Secondary | ICD-10-CM | POA: Insufficient documentation

## 2015-07-31 DIAGNOSIS — R21 Rash and other nonspecific skin eruption: Secondary | ICD-10-CM | POA: Diagnosis present

## 2015-07-31 DIAGNOSIS — Z79899 Other long term (current) drug therapy: Secondary | ICD-10-CM | POA: Diagnosis not present

## 2015-07-31 MED ORDER — FAMOTIDINE IN NACL 20-0.9 MG/50ML-% IV SOLN
20.0000 mg | Freq: Once | INTRAVENOUS | Status: AC
  Administered 2015-07-31: 20 mg via INTRAVENOUS

## 2015-07-31 MED ORDER — DEXAMETHASONE SODIUM PHOSPHATE 10 MG/ML IJ SOLN
8.0000 mg | Freq: Once | INTRAMUSCULAR | Status: AC
  Administered 2015-07-31: 8 mg via INTRAVENOUS
  Filled 2015-07-31: qty 1

## 2015-07-31 MED ORDER — METHYLPREDNISOLONE 4 MG PO TBPK: ORAL_TABLET | ORAL | Status: DC

## 2015-07-31 MED ORDER — HYDROXYZINE HCL 50 MG PO TABS
50.0000 mg | ORAL_TABLET | Freq: Once | ORAL | Status: AC
  Administered 2015-07-31: 50 mg via ORAL
  Filled 2015-07-31: qty 1

## 2015-07-31 MED ORDER — HYDROXYZINE HCL 50 MG PO TABS: 50.0000 mg | ORAL_TABLET | Freq: Three times a day (TID) | ORAL | Status: DC | PRN

## 2015-07-31 NOTE — ED Notes (Signed)
States she developed itching for several weeks ..was seen last week for same  conts to have the itch even after using creams and taking meds. Slight rash noted .

## 2015-07-31 NOTE — ED Provider Notes (Signed)
[facility] ([facility]) Emergency Department Provider Note  ____________________________________________  Time seen: Approximately 10:31 AM  I have reviewed the triage vital signs and the nursing notes.   HISTORY  Chief Complaint Allergic Reaction    HPI Molly Peters is a 54 y.o. female presenting with 3 weeks of itching and rash. The patient was seen on 07/25/2015 in the ED with this same complaint and was prescribed 6 day Prednisone taper and 1% kenalog cream, which did not improve the symptoms. Symptoms began 3 weeks ago, about 1-2 weeks post left shoulder replacement. Symptoms include an erythematous rash with welts that are extremely itchy. Patient states that the rash and itchiness move all over her body including head, palms, and soles. Currently rash is located on chest, left upper arm, and back.  Patient has tried cold packs, benadryl pills and spray, and cortisone cream, none of which improve the symptoms. She has not had any tongue or throat swelling, but states that in the last two days it feels like her throat feels more full but has not affected her ability to eat. She also states that she feels mildly SOB today.  Patient denies any new products in the home. She has washed all blankets, towels, and clothing. She denies tick or insect bites. She does not work in the yard. All new medications including antibiotics and pain medication for surgery have completed their course, and symptoms continue. Patient had a similar experience about 4-5 years ago and saw a dermatologist, who was unable to determine the cause.   Patient denies fever, chills, chest pain, diarrhea, and constipation.    Past Medical History  Diagnosis Date  . Depression   . GERD (gastroesophageal reflux disease)   . Anxiety   . Valvular heart disease     Initial workup a number of years ago at [facility].  Echo (10/2009) showed EF 60-65%,      normal LV size, moderate aortic insufficiency with a  trileaflet aortic valve and normal aortic root size.  Mild      mitral regurgitation.   . Fibromyalgia   . IBS (irritable bowel syndrome)   . Seizures     last seizure 2010  . History of gastric ulcer   . Hypertension     states under control with med., has been on med. x 1-2 yr.  . Internal hemorrhoid     states has had intermittent bright red bleeding with BM (11/15/2014)  . COPD (chronic obstructive pulmonary disease)     states SOB with ADLs; no O2 use; able to speak in complete sentences without SOB(11/15/2014)  . Osteoarthritis of right shoulder 11/2014  . Family history of adverse reaction to anesthesia     pt's mother and sister have hx. of post-op N/V  . TMJ syndrome   . Limited joint range of motion     neck - s/p cervical fusion  . Wears dentures     lower  . Difficulty swallowing pills     s/p cervical fusion  . Short-term memory loss   . Localized primary osteoarthritis of right shoulder region 11/23/2014  . Osteoarthritis of left shoulder 07/05/2015    Patient Active Problem List   Diagnosis Date Noted  . Osteoarthritis of left shoulder 07/05/2015  . Status post total shoulder replacement 07/05/2015  . Localized primary osteoarthritis of right shoulder region 11/23/2014  . S/P shoulder replacement 11/23/2014  . Osteoarthritis of right shoulder 04/13/2014  . Preoperative cardiovascular examination 03/20/2014  . Routine  general medical examination at a health care facility 04/05/2013  . Memory loss 04/05/2013  . CAROTID BRUIT, LEFT 01/02/2011  . RESTLESS LEG SYNDROME 10/21/2010  . HYPERTENSION 06/23/2010  . BACK PAIN, LUMBAR, WITH RADICULOPATHY 11/12/2009  . HYPERLIPIDEMIA-MIXED 10/23/2009  . VALVULAR HEART DISEASE 03/27/2009  . IRRITABLE BOWEL SYNDROME 11/13/2008  . FIBROMYALGIA 10/03/2007  . Generalized anxiety disorder 09/07/2007  . TOBACCO ABUSE 09/07/2007  . GLAUCOMA 09/07/2007  . HEMORRHOIDS 09/07/2007  . GASTRIC ULCER 09/07/2007  . DEPRESSION 08/08/2007   . COPD (chronic obstructive pulmonary disease) with chronic bronchitis 08/08/2007  . GERD 08/08/2007  . Seizure disorder 08/08/2007    Past Surgical History  Procedure Laterality Date  . Abdominal hysterectomy      partial  . Esophagogastroduodenoscopy  09/28/2008  . Nm myoview ltd      Lexiscan myoview (10/2009): EF 77%, normal wall motion, normal perfusion.   Marland Kitchen Anterior cervical decomp/discectomy fusion  02/06/2011    C4-5, C5-6, C6-7  . Colonoscopy  09/28/2008  . Shoulder arthroscopy with debridement and bicep tendon repair Right 04/13/2014    Procedure: RIGHT SHOULDER ARTHROSCOPY WITH DEBRIDEMENT EXTENSIVE;  Surgeon: Johnny Bridge, MD;  Location: [place];  Service: Orthopedics;  Laterality: Right;  . Cesarean section      x 2  . Breast lumpectomy Bilateral 1989    x 3 - benign  . Laminectomy with posterior lateral arthrodesis level 3  12/06/2009    with synovial cyst resection L3-4 bilat.  . Cardiac catheterization  1995  . Laminectomy with posterior lateral arthrodesis level 3  11/17/2006    L4-5  . Hardware removal  11/17/2006    L5-S1  . Total shoulder arthroplasty Right 11/23/2014    Procedure: TOTAL RIGHT SHOULDER ARTHROPLASTY;  Surgeon: Johnny Bridge, MD;  Location: [place];  Service: Orthopedics;  Laterality: Right;  . Total shoulder arthroplasty Left 07/05/2015    Procedure: LEFT TOTAL SHOULDER REPLACEMENT;  Surgeon: Marchia Bond, MD;  Location: [place];  Service: Orthopedics;  Laterality: Left;    Current Outpatient Rx  Name  Route  Sig  Dispense  Refill  . ALPRAZolam (XANAX) 1 MG tablet      TAKE 1 TABLET THREE TIMES DAILY AS NEEDED FOR ANXIETY   270 tablet   0   . baclofen (LIORESAL) 10 MG tablet   Oral   Take 1 tablet (10 mg total) by mouth 3 (three) times daily. As needed for muscle spasm   50 tablet   0   . gabapentin (NEURONTIN) 300 MG capsule      TAKE 1 CAPSULE TWICE DAILY AND TAKE 2 CAPSULES AT  BEDTIME.   360 capsule   3   . HYDROmorphone (DILAUDID) 2 MG tablet   Oral   Take 1 tablet (2 mg total) by mouth every 4 (four) hours as needed for severe pain.   50 tablet   0   . hydrOXYzine (ATARAX/VISTARIL) 50 MG tablet   Oral   Take 1 tablet (50 mg total) by mouth 3 (three) times daily as needed for itching.   15 tablet   0   . lamoTRIgine (LAMICTAL) 100 MG tablet      TAKE 1 TABLET TWICE DAILY   180 tablet   3   . losartan-hydrochlorothiazide (HYZAAR) 100-12.5 MG per tablet      TAKE 1 TABLET EVERY DAY   90 tablet   3   . methylPREDNISolone (MEDROL DOSEPAK) 4 MG TBPK tablet  Take Tapered dose as directed   21 tablet   0   . omeprazole (PRILOSEC) 20 MG capsule      TAKE 1 CAPSULE EVERY DAY   90 capsule   3   . ondansetron (ZOFRAN) 4 MG tablet   Oral   Take 1 tablet (4 mg total) by mouth every 8 (eight) hours as needed for nausea or vomiting.   30 tablet   0   . oxyCODONE-acetaminophen (PERCOCET) 10-325 MG per tablet   Oral   Take 1-2 tablets by mouth every 6 (six) hours as needed for pain. MAXIMUM TOTAL ACETAMINOPHEN DOSE IS 4000 MG PER DAY   50 tablet   0   . PARoxetine (PAXIL) 20 MG tablet      TAKE 1 TABLET EVERY MORNING   90 tablet   3   . potassium chloride (K-DUR) 10 MEQ tablet   Oral   Take 10 mEq by mouth 2 (two) times daily.         . predniSONE (DELTASONE) 10 MG tablet   Oral   Take 1 tablet (10 mg total) by mouth as directed. Take as 6, 5, 4, 3, 2, 1 taper   21 tablet   0   . sennosides-docusate sodium (SENOKOT-S) 8.6-50 MG tablet   Oral   Take 2 tablets by mouth daily.   30 tablet   1   . triamcinolone cream (KENALOG) 0.1 %   Topical   Apply 1 application topically 4 (four) times daily. Do note use for longer than 1 week in duration   30 g   0     Allergies Carbamazepine; Clonazepam; Divalproex sodium; Diphenhydramine hcl; Tiotropium bromide monohydrate; and Vicodin  Family History  Problem Relation Age of  Onset  . Arthritis Mother   . Cervical cancer Mother   . Anesthesia problems Mother     post-op N/V  . Healthy Father   . Cancer Father     colon cancer  . Migraines Sister   . Cervical cancer Sister   . Anesthesia problems Sister     post-op N/V  . Migraines Brother   . Asthma Daughter   . Coronary artery disease Maternal Grandmother   . Hypertension Maternal Grandmother   . Diabetes Maternal Grandmother   . Coronary artery disease Paternal Grandmother   . Hypertension Paternal Grandmother   . Diabetes Paternal Grandmother     Social History Social History  Substance Use Topics  . Smoking status: Current Every Day Smoker -- 1.50 packs/day for 42 years    Types: Cigarettes  . Smokeless tobacco: Never Used  . Alcohol Use: Yes     Comment: occasionally    Review of Systems Constitutional: No fever/chills Eyes: No visual changes. ENT: No sore throat, but it "feels more full." No nasal congestion. Cardiovascular: Denies chest pain. Respiratory: Positive for mild shortness of breath. Gastrointestinal: No abdominal pain.  No nausea, no vomiting.  No diarrhea.  No constipation. Genitourinary: Negative for dysuria. Musculoskeletal: Positive for chronic back pain. Skin: Positive for rash, welts, and itching. Neurological: Negative for headaches, focal weakness or numbness. Hematological/Lymphatic:Denies easy bruising/bleeding.  Allergic/Immunilogical: Positive for allergies, including to Benadryl 10-point ROS otherwise negative.  ____________________________________________   PHYSICAL EXAM:  VITAL SIGNS: ED Triage Vitals  Enc Vitals Group     BP 07/31/15 0917 126/92 mmHg     Pulse Rate 07/31/15 0917 95     Resp 07/31/15 0917 20     Temp 07/31/15 0917 97.8 F (  36.6 C)     Temp Source 07/31/15 0917 Oral     SpO2 07/31/15 0917 98 %     Weight 07/31/15 0917 100 lb (45.36 kg)     Height 07/31/15 0917 5' (1.524 m)     Head Cir --      Peak Flow --      Pain Score  --      Pain Loc --      Pain Edu? --      Excl. in [place]? --     Constitutional: Alert and oriented. Well appearing. Appears in mild distress, rocking back and forth and scratching upon entry to the room  Eyes: Conjunctivae have small yellow deposits medially that patient states opthalmologic is aware of. PERRL. EOMI. Head: Atraumatic. Nose: No congestion/rhinnorhea. Mouth/Throat: Mucous membranes are moist.  Oropharynx non-erythematous. Neck: No stridor.   Hematological/Lymphatic/Immunilogical: No cervical lymphadenopathy. Cardiovascular: Normal rate, regular rhythm. Grossly normal heart sounds.  Good peripheral circulation. Respiratory: Normal respiratory effort.  No retractions. Lungs CTAB. Gastrointestinal: Soft and nontender. No distention. No abdominal bruits. No CVA tenderness. Musculoskeletal: Paravertebral tenderness to back. No lower extremity tenderness nor edema.  No joint effusions. Neurologic:  Normal speech and language. No gross focal neurologic deficits are appreciated. No gait instability. Skin:  Well healing scar to left shoulder with no signs of local infection. Erythematous rash with hives and scratch marks noted to chest, left back, upper left arm, and thighs.  Psychiatric: Mood and affect are normal. Speech and behavior are normal. Patient is rocking and scratching at legs.   ____________________________________________   LABS (all labs ordered are listed, but only abnormal results are displayed)  Labs Reviewed - No data to display ____________________________________________  EKG  None ____________________________________________  RADIOLOGY  None ____________________________________________   PROCEDURES  Procedure(s) performed: None  Critical Care performed: No  ____________________________________________   INITIAL IMPRESSION / ASSESSMENT AND PLAN / ED COURSE  Pertinent labs & imaging results that were available during my care of the patient  were reviewed by me and considered in my medical decision making (see chart for details).  54 yo female with continued complaints of rash and itching for 3 weeks. Patient states that she has been taking OTC Benadryl even though her records state that she is allergic to Benadryl. This is a likely source of continued allergic reaction for the patient. Patient advised to stop taking Benadryl. Decadron 8 mg IV, Pepcid IVPB 20 mg, Atarax/Vitaril 50 mg po administered in the ED.    Patient voices no other emergency medical complaints at this time. Patient to follow up with PCP or return to ED if symptoms continue or worsen. Patient discharged with prescription for Atarax and prednisone. Patient advised to follow with dermatology for further evaluation. ____________________________________________   FINAL CLINICAL IMPRESSION(S) / ED DIAGNOSES  Final diagnoses:  Allergic reaction, subsequent encounter  Urticaria     Sable Feil, PA-C 07/31/15 [place], MD 07/31/15 [phone]

## 2015-07-31 NOTE — Discharge Instructions (Signed)
Allergies °Allergies may happen from anything your body is sensitive to. This may be food, medicines, pollens, chemicals, and nearly anything around you in everyday life that produces allergens. An allergen is anything that causes an allergy producing substance. Heredity is often a factor in causing these problems. This means you may have some of the same allergies as your parents. °Food allergies happen in all age groups. Food allergies are some of the most severe and life threatening. Some common food allergies are cow's milk, seafood, eggs, nuts, wheat, and soybeans. °SYMPTOMS  °· Swelling around the mouth. °· An itchy red rash or hives. °· Vomiting or diarrhea. °· Difficulty breathing. °SEVERE ALLERGIC REACTIONS ARE LIFE-THREATENING. °This reaction is called anaphylaxis. It can cause the mouth and throat to swell and cause difficulty with breathing and swallowing. In severe reactions only a trace amount of food (for example, peanut oil in a salad) may cause death within seconds. °Seasonal allergies occur in all age groups. These are seasonal because they usually occur during the same season every year. They may be a reaction to molds, grass pollens, or tree pollens. Other causes of problems are house dust mite allergens, pet dander, and mold spores. The symptoms often consist of nasal congestion, a runny itchy nose associated with sneezing, and tearing itchy eyes. There is often an associated itching of the mouth and ears. The problems happen when you come in contact with pollens and other allergens. Allergens are the particles in the air that the body reacts to with an allergic reaction. This causes you to release allergic antibodies. Through a chain of events, these eventually cause you to release histamine into the blood stream. Although it is meant to be protective to the body, it is this release that causes your discomfort. This is why you were given anti-histamines to feel better.  If you are unable to  pinpoint the offending allergen, it may be determined by skin or blood testing. Allergies cannot be cured but can be controlled with medicine. °Hay fever is a collection of all or some of the seasonal allergy problems. It may often be treated with simple over-the-counter medicine such as diphenhydramine. Take medicine as directed. Do not drink alcohol or drive while taking this medicine. Check with your caregiver or package insert for child dosages. °If these medicines are not effective, there are many new medicines your caregiver can prescribe. Stronger medicine such as nasal spray, eye drops, and corticosteroids may be used if the first things you try do not work well. Other treatments such as immunotherapy or desensitizing injections can be used if all else fails. Follow up with your caregiver if problems continue. These seasonal allergies are usually not life threatening. They are generally more of a nuisance that can often be handled using medicine. °HOME CARE INSTRUCTIONS  °· If unsure what causes a reaction, keep a diary of foods eaten and symptoms that follow. Avoid foods that cause reactions. °· If hives or rash are present: °· Take medicine as directed. °· You may use an over-the-counter antihistamine (diphenhydramine) for hives and itching as needed. °· Apply cold compresses (cloths) to the skin or take baths in cool water. Avoid hot baths or showers. Heat will make a rash and itching worse. °· If you are severely allergic: °· Following a treatment for a severe reaction, hospitalization is often required for closer follow-up. °· Wear a medic-alert bracelet or necklace stating the allergy. °· You and your family must learn how to give adrenaline or use   an anaphylaxis kit. °· If you have had a severe reaction, always carry your anaphylaxis kit or EpiPen® with you. Use this medicine as directed by your caregiver if a severe reaction is occurring. Failure to do so could have a fatal outcome. °SEEK MEDICAL  CARE IF: °· You suspect a food allergy. Symptoms generally happen within 30 minutes of eating a food. °· Your symptoms have not gone away within 2 days or are getting worse. °· You develop new symptoms. °· You want to retest yourself or your child with a food or drink you think causes an allergic reaction. Never do this if an anaphylactic reaction to that food or drink has happened before. Only do this under the care of a caregiver. °SEEK IMMEDIATE MEDICAL CARE IF:  °· You have difficulty breathing, are wheezing, or have a tight feeling in your chest or throat. °· You have a swollen mouth, or you have hives, swelling, or itching all over your body. °· You have had a severe reaction that has responded to your anaphylaxis kit or an EpiPen®. These reactions may return when the medicine has worn off. These reactions should be considered life threatening. °MAKE SURE YOU:  °· Understand these instructions. °· Will watch your condition. °· Will get help right away if you are not doing well or get worse. °Document Released: 01/19/2003 Document Revised: 02/20/2013 Document Reviewed: 06/25/2008 °ExitCare® Patient Information ©2015 [employer]. This information is not intended to replace advice given to you by your health care provider. Make sure you discuss any questions you have with your health care provider. °Hives °Hives are itchy, red, swollen areas of the skin. They can vary in size and location on your body. Hives can come and go for hours or several days (acute hives) or for several weeks (chronic hives). Hives do not spread from person to person (noncontagious). They may get worse with scratching, exercise, and emotional stress. °CAUSES  °· Allergic reaction to food, additives, or drugs. °· Infections, including the common cold. °· Illness, such as vasculitis, lupus, or thyroid disease. °· Exposure to sunlight, heat, or cold. °· Exercise. °· Stress. °· Contact with chemicals. °SYMPTOMS  °· Red or white swollen  patches on the skin. The patches may change size, shape, and location quickly and repeatedly. °· Itching. °· Swelling of the hands, feet, and face. This may occur if hives develop deeper in the skin. °DIAGNOSIS  °Your caregiver can usually tell what is wrong by performing a physical exam. Skin or blood tests may also be done to determine the cause of your hives. In some cases, the cause cannot be determined. °TREATMENT  °Mild cases usually get better with medicines such as antihistamines. Severe cases may require an emergency epinephrine injection. If the cause of your hives is known, treatment includes avoiding that trigger.  °HOME CARE INSTRUCTIONS  °· Avoid causes that trigger your hives. °· Take antihistamines as directed by your caregiver to reduce the severity of your hives. Non-sedating or low-sedating antihistamines are usually recommended. Do not drive while taking an antihistamine. °· Take any other medicines prescribed for itching as directed by your caregiver. °· Wear loose-fitting clothing. °· Keep all follow-up appointments as directed by your caregiver. °SEEK MEDICAL CARE IF:  °· You have persistent or severe itching that is not relieved with medicine. °· You have painful or swollen joints. °SEEK IMMEDIATE MEDICAL CARE IF:  °· You have a fever. °· Your tongue or lips are swollen. °· You have trouble breathing   or swallowing. °· You feel tightness in the throat or chest. °· You have abdominal pain. °These problems may be the first sign of a life-threatening allergic reaction. Call your local emergency services (911 in [place] °MAKE SURE YOU:  °· Understand these instructions. °· Will watch your condition. °· Will get help right away if you are not doing well or get worse. °Document Released: 10/26/2005 Document Revised: 10/31/2013 Document Reviewed: 01/19/2012 °ExitCare® Patient Information ©2015 [employer]. This information is not intended to replace advice given to you by your health care provider.  Make sure you discuss any questions you have with your health care provider. ° °

## 2015-07-31 NOTE — ED Notes (Signed)
Pt presents with allergic reaction. Pt states had surgery about three weeks ago. No meds helping with the itching at home.

## 2015-07-31 NOTE — ED Notes (Addendum)
Pepcid stopped at 12:14. Duplicate charting.

## 2015-08-02 NOTE — Addendum Note (Signed)
Addendum  created 08/02/15 1831 by Albertha Ghee, MD   Modules edited: Anesthesia Blocks and Procedures, Clinical Notes   Clinical Notes:  File: [id number]

## 2015-08-15 DIAGNOSIS — M19012 Primary osteoarthritis, left shoulder: Secondary | ICD-10-CM | POA: Diagnosis not present

## 2015-08-26 DIAGNOSIS — L508 Other urticaria: Secondary | ICD-10-CM | POA: Diagnosis not present

## 2015-08-28 ENCOUNTER — Other Ambulatory Visit: Payer: Self-pay | Admitting: Internal Medicine

## 2015-08-28 NOTE — Telephone Encounter (Signed)
Okay for simvastatin for a year She is 2 weeks early for the alprazolam

## 2015-08-28 NOTE — Telephone Encounter (Signed)
rx sent to pharmacy by e-script  

## 2015-08-28 NOTE — Telephone Encounter (Signed)
06/17/15 

## 2015-09-03 ENCOUNTER — Other Ambulatory Visit: Payer: Self-pay | Admitting: *Deleted

## 2015-09-03 NOTE — Telephone Encounter (Signed)
Pt requesting medication refill. Has cancelled last 2 f/u appts. Last seen 02/2015

## 2015-09-03 NOTE — Telephone Encounter (Signed)
This is not due till ~11/5-6 She will have to request again next week

## 2015-09-03 NOTE — Telephone Encounter (Signed)
Spoke with patient and advised results   

## 2015-09-16 ENCOUNTER — Other Ambulatory Visit: Payer: Self-pay | Admitting: Internal Medicine

## 2015-09-16 NOTE — Telephone Encounter (Signed)
06/17/2015 

## 2015-09-16 NOTE — Telephone Encounter (Signed)
Approved: okay #[id number] x 0 

## 2015-09-17 NOTE — Telephone Encounter (Signed)
rx faxed to pharmacy manually [facility]

## 2015-09-23 ENCOUNTER — Ambulatory Visit: Payer: Medicare PPO | Admitting: Cardiovascular Disease

## 2015-09-23 NOTE — ED Provider Notes (Signed)
Medical screening examination/treatment/procedure(s) were performed by non-physician practitioner and as supervising physician I was immediately available for consultation/collaboration.  Lisa Roca, MD 09/23/15 [phone]

## 2015-10-02 ENCOUNTER — Ambulatory Visit: Payer: Medicare PPO | Admitting: Internal Medicine

## 2015-10-07 ENCOUNTER — Encounter: Payer: Medicare PPO | Admitting: Internal Medicine

## 2015-11-14 DIAGNOSIS — Z96611 Presence of right artificial shoulder joint: Secondary | ICD-10-CM | POA: Diagnosis not present

## 2015-11-21 ENCOUNTER — Ambulatory Visit: Payer: Medicare PPO | Admitting: Cardiovascular Disease

## 2015-12-05 ENCOUNTER — Other Ambulatory Visit: Payer: Self-pay | Admitting: Internal Medicine

## 2015-12-05 NOTE — Telephone Encounter (Signed)
Last filled 02/28/2015, pt has canceled all appointments, refill denied until appt made

## 2015-12-12 ENCOUNTER — Other Ambulatory Visit: Payer: Self-pay | Admitting: Internal Medicine

## 2015-12-12 NOTE — Telephone Encounter (Signed)
Spoke with patient and advised she would need to schedule a follow-up after this refill, per pt she will call back in a month to schedule rx faxed to pharmacy manually, [facility]

## 2015-12-12 NOTE — Telephone Encounter (Signed)
Approved: #[id number] x 0 She is due for follow up---let her know I need to see her before her next refill is due

## 2015-12-12 NOTE — Telephone Encounter (Signed)
09/17/2015 for #270 tablets

## 2015-12-23 ENCOUNTER — Other Ambulatory Visit: Payer: Self-pay

## 2015-12-23 NOTE — Telephone Encounter (Signed)
Pt left v/m requesting lamictal to [facility]. Last refilled # 180 x 3 on 10/17/14. Pt has appt 01/01/16. Pt out of med. Pt request cb.

## 2015-12-24 MED ORDER — LAMOTRIGINE 100 MG PO TABS: 100.0000 mg | ORAL_TABLET | Freq: Two times a day (BID) | ORAL | Status: DC

## 2015-12-24 NOTE — Telephone Encounter (Signed)
rx sent to pharmacy by e-script  

## 2015-12-24 NOTE — Telephone Encounter (Signed)
Approved: can refill for a year 

## 2015-12-26 DIAGNOSIS — M19011 Primary osteoarthritis, right shoulder: Secondary | ICD-10-CM | POA: Diagnosis not present

## 2015-12-26 DIAGNOSIS — M19012 Primary osteoarthritis, left shoulder: Secondary | ICD-10-CM | POA: Diagnosis not present

## 2016-01-01 ENCOUNTER — Encounter: Payer: Self-pay | Admitting: Internal Medicine

## 2016-01-01 ENCOUNTER — Ambulatory Visit (INDEPENDENT_AMBULATORY_CARE_PROVIDER_SITE_OTHER): Payer: Medicare PPO | Admitting: Internal Medicine

## 2016-01-01 VITALS — BP 124/90 | HR 64 | Temp 97.3°F | Wt 103.0 lb

## 2016-01-01 DIAGNOSIS — G40909 Epilepsy, unspecified, not intractable, without status epilepticus: Secondary | ICD-10-CM | POA: Diagnosis not present

## 2016-01-01 DIAGNOSIS — J449 Chronic obstructive pulmonary disease, unspecified: Secondary | ICD-10-CM | POA: Diagnosis not present

## 2016-01-01 DIAGNOSIS — M549 Dorsalgia, unspecified: Secondary | ICD-10-CM | POA: Diagnosis not present

## 2016-01-01 DIAGNOSIS — F39 Unspecified mood [affective] disorder: Secondary | ICD-10-CM | POA: Diagnosis not present

## 2016-01-01 DIAGNOSIS — I38 Endocarditis, valve unspecified: Secondary | ICD-10-CM

## 2016-01-01 DIAGNOSIS — J4489 Other specified chronic obstructive pulmonary disease: Secondary | ICD-10-CM

## 2016-01-01 DIAGNOSIS — G8929 Other chronic pain: Secondary | ICD-10-CM

## 2016-01-01 LAB — COMPREHENSIVE METABOLIC PANEL
ALBUMIN: 4.6 g/dL (ref 3.5–5.2)
ALK PHOS: 93 U/L (ref 39–117)
ALT: 12 U/L (ref 0–35)
AST: 21 U/L (ref 0–37)
BILIRUBIN TOTAL: 0.2 mg/dL (ref 0.2–1.2)
BUN: 8 mg/dL (ref 6–23)
CO2: 30 mEq/L (ref 19–32)
CREATININE: 0.87 mg/dL (ref 0.40–1.20)
Calcium: 9.9 mg/dL (ref 8.4–10.5)
Chloride: 103 mEq/L (ref 96–112)
GFR: 71.57 mL/min (ref >60.00)
Glucose, Bld: 87 mg/dL (ref 70–99)
POTASSIUM: 3.8 meq/L (ref 3.5–5.1)
SODIUM: 140 meq/L (ref 135–145)
TOTAL PROTEIN: 7.6 g/dL (ref 6.0–8.3)

## 2016-01-01 LAB — CBC WITH DIFFERENTIAL/PLATELET
BASOS ABS: 0.1 10*3/uL (ref 0.0–0.1)
Basophils Relative: 0.8 % (ref 0.0–3.0)
EOS ABS: 0.1 10*3/uL (ref 0.0–0.7)
Eosinophils Relative: 1 % (ref 0.0–5.0)
HCT: 36.2 % (ref 36.0–46.0)
Hemoglobin: 12.1 g/dL (ref 12.0–15.0)
LYMPHS ABS: 4 10*3/uL (ref 0.7–4.0)
Lymphocytes Relative: 48 % — ABNORMAL HIGH (ref 12.0–46.0)
MCHC: 33.3 g/dL (ref 30.0–36.0)
MCV: 85.1 fl (ref 78.0–100.0)
MONO ABS: 0.6 10*3/uL (ref 0.1–1.0)
Monocytes Relative: 7.3 % (ref 3.0–12.0)
NEUTROS PCT: 42.9 % — AB (ref 43.0–77.0)
Neutro Abs: 3.6 10*3/uL (ref 1.4–7.7)
Platelets: 414 10*3/uL — ABNORMAL HIGH (ref 150.0–400.0)
RBC: 4.25 Mil/uL (ref 3.87–5.11)
RDW: 15.4 % (ref 11.5–15.5)
WBC: 8.4 10*3/uL (ref 4.0–10.5)

## 2016-01-01 MED ORDER — HYDROXYZINE HCL 50 MG PO TABS: 50.0000 mg | ORAL_TABLET | Freq: Three times a day (TID) | ORAL | Status: DC | PRN

## 2016-01-01 MED ORDER — LAMOTRIGINE 100 MG PO TABS: 100.0000 mg | ORAL_TABLET | Freq: Two times a day (BID) | ORAL | Status: DC

## 2016-01-01 MED ORDER — BACLOFEN 10 MG PO TABS: 10.0000 mg | ORAL_TABLET | Freq: Three times a day (TID) | ORAL | Status: DC

## 2016-01-01 NOTE — Assessment & Plan Note (Signed)
Stable Just not able to stop the smoking

## 2016-01-01 NOTE — Addendum Note (Signed)
Addended by: Pilar Grammes on: 01/01/2016 01:03 PM   Modules accepted: Orders

## 2016-01-01 NOTE — Assessment & Plan Note (Signed)
Complicated history No change

## 2016-01-01 NOTE — Assessment & Plan Note (Signed)
With radiculopathy Off narcotics Will refill the baclofen

## 2016-01-01 NOTE — Progress Notes (Signed)
Pre visit review using our clinic review tool, if applicable. No additional management support is needed unless otherwise documented below in the visit note. 

## 2016-01-01 NOTE — Progress Notes (Signed)
Subjective:    Patient ID: Molly Peters, female    DOB: 1960-12-09, 55 y.o.   MRN: [id number]  HPI Here for follow up of anxiety and other medical conditions  Had the other shoulder replaced Some improvement slowly over time Now off the narcotics  Anxiety persists Some panic still Continues on paxil and regular xanax  No seizures on the lamictal Had them frequently for a while--but none in years On the gabapentin for RLS, etc  Still smoking Not really able to quit--though she would like to Regular SOB--no change. Some days can do all housework, etc---but still has to sit to rest (often due to her pain though) Chronic cough--- sputum at times Doesn't hear any wheezing  Current Outpatient Prescriptions on File Prior to Visit  Medication Sig Dispense Refill  . ALPRAZolam (XANAX) 1 MG tablet TAKE 1 TABLET THREE TIMES DAILY AS NEEDED FOR ANXIETY 270 tablet 0  . gabapentin (NEURONTIN) 300 MG capsule TAKE 1 CAPSULE TWICE DAILY AND TAKE 2 CAPSULES AT BEDTIME. 360 capsule 3  . hydrOXYzine (ATARAX/VISTARIL) 50 MG tablet Take 1 tablet (50 mg total) by mouth 3 (three) times daily as needed for itching. 15 tablet 0  . lamoTRIgine (LAMICTAL) 100 MG tablet Take 1 tablet (100 mg total) by mouth 2 (two) times daily. 60 tablet 11  . losartan-hydrochlorothiazide (HYZAAR) 100-12.5 MG per tablet TAKE 1 TABLET EVERY DAY 90 tablet 3  . omeprazole (PRILOSEC) 20 MG capsule TAKE 1 CAPSULE EVERY DAY 90 capsule 3  . PARoxetine (PAXIL) 20 MG tablet TAKE 1 TABLET EVERY MORNING 90 tablet 3  . potassium chloride (K-DUR) 10 MEQ tablet Take 10 mEq by mouth 2 (two) times daily.    . simvastatin (ZOCOR) 20 MG tablet TAKE 1 TABLET AT BEDTIME 90 tablet 3  . triamcinolone cream (KENALOG) 0.1 % Apply 1 application topically 4 (four) times daily. Do note use for longer than 1 week in duration 30 g 0   No current facility-administered medications on file prior to visit.    Allergies  Allergen Reactions  .  Carbamazepine Hives  . Divalproex Sodium Swelling    HAIR FALLS OUT  . Clonazepam Other (See Comments)    HALLUCINATIONS HALLUCINATIONS  . Divalproex Sodium Swelling    HAIR FALLS OUT  . Diphenhydramine Hcl Other (See Comments)    UNKNOWN  . Diphenhydramine Hcl Rash    UNKNOWN  . Tiotropium Other (See Comments)    CREATES YEAST IN THROAT  . Tiotropium Bromide Monohydrate Other (See Comments)    CREATES YEAST IN THROAT  . Vicodin [Hydrocodone-Acetaminophen] Itching    Past Medical History  Diagnosis Date  . Depression   . GERD (gastroesophageal reflux disease)   . Anxiety   . Valvular heart disease     Initial workup a number of years ago at [facility].  Echo (10/2009) showed EF 60-65%,      normal LV size, moderate aortic insufficiency with a trileaflet aortic valve and normal aortic root size.  Mild      mitral regurgitation.   . Fibromyalgia   . IBS (irritable bowel syndrome)   . Seizures ([place])     last seizure 2010  . History of gastric ulcer   . Hypertension     states under control with med., has been on med. x 1-2 yr.  . Internal hemorrhoid     states has had intermittent bright red bleeding with BM (11/15/2014)  . COPD (chronic obstructive pulmonary disease) ([place])  states SOB with ADLs; no O2 use; able to speak in complete sentences without SOB(11/15/2014)  . Osteoarthritis of right shoulder 11/2014  . Family history of adverse reaction to anesthesia     pt's mother and sister have hx. of post-op N/V  . TMJ syndrome   . Limited joint range of motion     neck - s/p cervical fusion  . Wears dentures     lower  . Difficulty swallowing pills     s/p cervical fusion  . Short-term memory loss   . Localized primary osteoarthritis of right shoulder region 11/23/2014  . Osteoarthritis of left shoulder 07/05/2015    Past Surgical History  Procedure Laterality Date  . Abdominal hysterectomy      partial  . Esophagogastroduodenoscopy  09/28/2008  . Nm myoview ltd       Lexiscan myoview (10/2009): EF 77%, normal wall motion, normal perfusion.   Marland Kitchen Anterior cervical decomp/discectomy fusion  02/06/2011    C4-5, C5-6, C6-7  . Colonoscopy  09/28/2008  . Shoulder arthroscopy with debridement and bicep tendon repair Right 04/13/2014    Procedure: RIGHT SHOULDER ARTHROSCOPY WITH DEBRIDEMENT EXTENSIVE;  Surgeon: Johnny Bridge, MD;  Location: [facility];  Service: Orthopedics;  Laterality: Right;  . Cesarean section      x 2  . Breast lumpectomy Bilateral 1989    x 3 - benign  . Laminectomy with posterior lateral arthrodesis level 3  12/06/2009    with synovial cyst resection L3-4 bilat.  . Cardiac catheterization  1995  . Laminectomy with posterior lateral arthrodesis level 3  11/17/2006    L4-5  . Hardware removal  11/17/2006    L5-S1  . Total shoulder arthroplasty Right 11/23/2014    Procedure: TOTAL RIGHT SHOULDER ARTHROPLASTY;  Surgeon: Johnny Bridge, MD;  Location: [facility];  Service: Orthopedics;  Laterality: Right;  . Total shoulder arthroplasty Left 07/05/2015    Procedure: LEFT TOTAL SHOULDER REPLACEMENT;  Surgeon: Marchia Bond, MD;  Location: [facility];  Service: Orthopedics;  Laterality: Left;    Family History  Problem Relation Age of Onset  . Arthritis Mother   . Cervical cancer Mother   . Anesthesia problems Mother     post-op N/V  . Healthy Father   . Cancer Father     colon cancer  . Migraines Sister   . Cervical cancer Sister   . Anesthesia problems Sister     post-op N/V  . Migraines Brother   . Asthma Daughter   . Coronary artery disease Maternal Grandmother   . Hypertension Maternal Grandmother   . Diabetes Maternal Grandmother   . Coronary artery disease Paternal Grandmother   . Hypertension Paternal Grandmother   . Diabetes Paternal Grandmother     Social History   Social History  . Marital Status: Married    Spouse Name: N/A  . Number of Children: 3  . Years of Education:  N/A   Occupational History  . Disabled 2003    Social History Main Topics  . Smoking status: Current Every Day Smoker -- 1.50 packs/day for 42 years    Types: Cigarettes  . Smokeless tobacco: Never Used  . Alcohol Use: Yes     Comment: occasionally  . Drug Use: No  . Sexual Activity: Not on file   Other Topics Concern  . Not on file   Social History Narrative       Review of Systems Sleep is variable--up and down  at times Appetite is okay Weight is stable Still rash and some itching---despite derm evaluations and multiple meds. Does use OTC antihistamine but thinks vistaril also helps    Objective:   Physical Exam  Constitutional: No distress.  Neck: Normal range of motion. Neck supple. No thyromegaly present.  Cardiovascular: Normal rate and regular rhythm.  Exam reveals no gallop.   Loud AI murmur. Softer aortic systolic murmur  Pulmonary/Chest: Effort normal. No respiratory distress. She has no wheezes. She has no rales.  ?slightly decreased breath sounds but clear  Musculoskeletal: She exhibits no edema.  Lymphadenopathy:    She has no cervical adenopathy.  Psychiatric: She has a normal mood and affect. Her behavior is normal.          Assessment & Plan:

## 2016-01-01 NOTE — Assessment & Plan Note (Signed)
Anxiety with panic and some dysthymia (past depression) paxil and regular alprazolam

## 2016-01-01 NOTE — Assessment & Plan Note (Signed)
None on the lamictal 

## 2016-01-02 ENCOUNTER — Encounter: Payer: Self-pay | Admitting: *Deleted

## 2016-01-29 ENCOUNTER — Encounter: Payer: Self-pay | Admitting: Pain Medicine

## 2016-01-29 ENCOUNTER — Ambulatory Visit: Payer: Medicare PPO | Attending: Pain Medicine | Admitting: Pain Medicine

## 2016-01-29 ENCOUNTER — Encounter (INDEPENDENT_AMBULATORY_CARE_PROVIDER_SITE_OTHER): Payer: Self-pay

## 2016-01-29 VITALS — BP 138/79 | Ht 61.0 in | Wt 106.0 lb

## 2016-01-29 DIAGNOSIS — F419 Anxiety disorder, unspecified: Secondary | ICD-10-CM | POA: Diagnosis not present

## 2016-01-29 DIAGNOSIS — M79602 Pain in left arm: Secondary | ICD-10-CM

## 2016-01-29 DIAGNOSIS — Z90710 Acquired absence of both cervix and uterus: Secondary | ICD-10-CM | POA: Diagnosis not present

## 2016-01-29 DIAGNOSIS — Z0189 Encounter for other specified special examinations: Secondary | ICD-10-CM

## 2016-01-29 DIAGNOSIS — F39 Unspecified mood [affective] disorder: Secondary | ICD-10-CM | POA: Diagnosis not present

## 2016-01-29 DIAGNOSIS — F17200 Nicotine dependence, unspecified, uncomplicated: Secondary | ICD-10-CM

## 2016-01-29 DIAGNOSIS — M419 Scoliosis, unspecified: Secondary | ICD-10-CM | POA: Insufficient documentation

## 2016-01-29 DIAGNOSIS — Z79899 Other long term (current) drug therapy: Secondary | ICD-10-CM

## 2016-01-29 DIAGNOSIS — M19011 Primary osteoarthritis, right shoulder: Secondary | ICD-10-CM | POA: Diagnosis not present

## 2016-01-29 DIAGNOSIS — K589 Irritable bowel syndrome without diarrhea: Secondary | ICD-10-CM | POA: Insufficient documentation

## 2016-01-29 DIAGNOSIS — M25511 Pain in right shoulder: Secondary | ICD-10-CM | POA: Diagnosis not present

## 2016-01-29 DIAGNOSIS — F1190 Opioid use, unspecified, uncomplicated: Secondary | ICD-10-CM

## 2016-01-29 DIAGNOSIS — M25561 Pain in right knee: Secondary | ICD-10-CM | POA: Insufficient documentation

## 2016-01-29 DIAGNOSIS — M542 Cervicalgia: Secondary | ICD-10-CM

## 2016-01-29 DIAGNOSIS — J449 Chronic obstructive pulmonary disease, unspecified: Secondary | ICD-10-CM | POA: Diagnosis not present

## 2016-01-29 DIAGNOSIS — M549 Dorsalgia, unspecified: Secondary | ICD-10-CM

## 2016-01-29 DIAGNOSIS — G2581 Restless legs syndrome: Secondary | ICD-10-CM | POA: Insufficient documentation

## 2016-01-29 DIAGNOSIS — Z79891 Long term (current) use of opiate analgesic: Secondary | ICD-10-CM | POA: Diagnosis not present

## 2016-01-29 DIAGNOSIS — E782 Mixed hyperlipidemia: Secondary | ICD-10-CM | POA: Diagnosis not present

## 2016-01-29 DIAGNOSIS — M539 Dorsopathy, unspecified: Secondary | ICD-10-CM

## 2016-01-29 DIAGNOSIS — G40909 Epilepsy, unspecified, not intractable, without status epilepticus: Secondary | ICD-10-CM | POA: Insufficient documentation

## 2016-01-29 DIAGNOSIS — M797 Fibromyalgia: Secondary | ICD-10-CM | POA: Diagnosis not present

## 2016-01-29 DIAGNOSIS — M25512 Pain in left shoulder: Secondary | ICD-10-CM | POA: Diagnosis not present

## 2016-01-29 DIAGNOSIS — M79601 Pain in right arm: Secondary | ICD-10-CM | POA: Insufficient documentation

## 2016-01-29 DIAGNOSIS — F329 Major depressive disorder, single episode, unspecified: Secondary | ICD-10-CM | POA: Diagnosis not present

## 2016-01-29 DIAGNOSIS — I10 Essential (primary) hypertension: Secondary | ICD-10-CM | POA: Diagnosis not present

## 2016-01-29 DIAGNOSIS — F1290 Cannabis use, unspecified, uncomplicated: Secondary | ICD-10-CM | POA: Diagnosis not present

## 2016-01-29 DIAGNOSIS — F17210 Nicotine dependence, cigarettes, uncomplicated: Secondary | ICD-10-CM | POA: Insufficient documentation

## 2016-01-29 DIAGNOSIS — M546 Pain in thoracic spine: Secondary | ICD-10-CM

## 2016-01-29 DIAGNOSIS — Z981 Arthrodesis status: Secondary | ICD-10-CM | POA: Insufficient documentation

## 2016-01-29 DIAGNOSIS — M545 Low back pain: Secondary | ICD-10-CM | POA: Diagnosis not present

## 2016-01-29 DIAGNOSIS — F1210 Cannabis abuse, uncomplicated: Secondary | ICD-10-CM | POA: Diagnosis not present

## 2016-01-29 DIAGNOSIS — M25562 Pain in left knee: Secondary | ICD-10-CM | POA: Insufficient documentation

## 2016-01-29 DIAGNOSIS — M19012 Primary osteoarthritis, left shoulder: Secondary | ICD-10-CM | POA: Diagnosis not present

## 2016-01-29 DIAGNOSIS — H409 Unspecified glaucoma: Secondary | ICD-10-CM | POA: Insufficient documentation

## 2016-01-29 DIAGNOSIS — K219 Gastro-esophageal reflux disease without esophagitis: Secondary | ICD-10-CM | POA: Insufficient documentation

## 2016-01-29 DIAGNOSIS — G8929 Other chronic pain: Secondary | ICD-10-CM | POA: Insufficient documentation

## 2016-01-29 DIAGNOSIS — Z5181 Encounter for therapeutic drug level monitoring: Secondary | ICD-10-CM | POA: Insufficient documentation

## 2016-01-29 DIAGNOSIS — Z9889 Other specified postprocedural states: Secondary | ICD-10-CM

## 2016-01-29 DIAGNOSIS — R6884 Jaw pain: Secondary | ICD-10-CM | POA: Insufficient documentation

## 2016-01-29 DIAGNOSIS — R0989 Other specified symptoms and signs involving the circulatory and respiratory systems: Secondary | ICD-10-CM | POA: Insufficient documentation

## 2016-01-29 DIAGNOSIS — M961 Postlaminectomy syndrome, not elsewhere classified: Secondary | ICD-10-CM

## 2016-01-29 NOTE — Patient Instructions (Signed)
Smoking Cessation, Tips for Success If you are ready to quit smoking, congratulations! You have chosen to help yourself be healthier. Cigarettes bring nicotine, tar, carbon monoxide, and other irritants into your body. Your lungs, heart, and blood vessels will be able to work better without these poisons. There are many different ways to quit smoking. Nicotine gum, nicotine patches, a nicotine inhaler, or nicotine nasal spray can help with physical craving. Hypnosis, support groups, and medicines help break the habit of smoking. WHAT THINGS CAN I DO TO MAKE QUITTING EASIER?  Here are some tips to help you quit for good:  Pick a date when you will quit smoking completely. Tell all of your friends and family about your plan to quit on that date.  Do not try to slowly cut down on the number of cigarettes you are smoking. Pick a quit date and quit smoking completely starting on that day.  Throw away all cigarettes.   Clean and remove all ashtrays from your home, work, and car.  On a card, write down your reasons for quitting. Carry the card with you and read it when you get the urge to smoke.  Cleanse your body of nicotine. Drink enough water and fluids to keep your urine clear or pale yellow. Do this after quitting to flush the nicotine from your body.  Learn to predict your moods. Do not let a bad situation be your excuse to have a cigarette. Some situations in your life might tempt you into wanting a cigarette.  Never have "just one" cigarette. It leads to wanting another and another. Remind yourself of your decision to quit.  Change habits associated with smoking. If you smoked while driving or when feeling stressed, try other activities to replace smoking. Stand up when drinking your coffee. Brush your teeth after eating. Sit in a different chair when you read the paper. Avoid alcohol while trying to quit, and try to drink fewer caffeinated beverages. Alcohol and caffeine may urge you to  smoke.  Avoid foods and drinks that can trigger a desire to smoke, such as sugary or spicy foods and alcohol.  Ask people who smoke not to smoke around you.  Have something planned to do right after eating or having a cup of coffee. For example, plan to take a walk or exercise.  Try a relaxation exercise to calm you down and decrease your stress. Remember, you may be tense and nervous for the first 2 weeks after you quit, but this will pass.  Find new activities to keep your hands busy. Play with a pen, coin, or rubber band. Doodle or draw things on paper.  Brush your teeth right after eating. This will help cut down on the craving for the taste of tobacco after meals. You can also try mouthwash.   Use oral substitutes in place of cigarettes. Try using lemon drops, carrots, cinnamon sticks, or chewing gum. Keep them handy so they are available when you have the urge to smoke.  When you have the urge to smoke, try deep breathing.  Designate your home as a nonsmoking area.  If you are a heavy smoker, ask your health care provider about a prescription for nicotine chewing gum. It can ease your withdrawal from nicotine.  Reward yourself. Set aside the cigarette money you save and buy yourself something nice.  Look for support from others. Join a support group or smoking cessation program. Ask someone at home or at work to help you with your plan   to quit smoking.  Always ask yourself, "Do I need this cigarette or is this just a reflex?" Tell yourself, "Today, I choose not to smoke," or "I do not want to smoke." You are reminding yourself of your decision to quit.  Do not replace cigarette smoking with electronic cigarettes (commonly called e-cigarettes). The safety of e-cigarettes is unknown, and some may contain harmful chemicals.  If you relapse, do not give up! Plan ahead and think about what you will do the next time you get the urge to smoke. HOW WILL I FEEL WHEN I QUIT SMOKING? You  may have symptoms of withdrawal because your body is used to nicotine (the addictive substance in cigarettes). You may crave cigarettes, be irritable, feel very hungry, cough often, get headaches, or have difficulty concentrating. The withdrawal symptoms are only temporary. They are strongest when you first quit but will go away within 10-14 days. When withdrawal symptoms occur, stay in control. Think about your reasons for quitting. Remind yourself that these are signs that your body is healing and getting used to being without cigarettes. Remember that withdrawal symptoms are easier to treat than the major diseases that smoking can cause.  Even after the withdrawal is over, expect periodic urges to smoke. However, these cravings are generally short lived and will go away whether you smoke or not. Do not smoke! WHAT RESOURCES ARE AVAILABLE TO HELP ME QUIT SMOKING? Your health care provider can direct you to community resources or hospitals for support, which may include:  Group support.  Education.  Hypnosis.  Therapy.   This information is not intended to replace advice given to you by your health care provider. Make sure you discuss any questions you have with your health care provider.   Document Released: 07/24/2004 Document Revised: 11/16/2014 Document Reviewed: 04/13/2013 Elsevier Interactive Patient Education 2016 [employer]  

## 2016-01-29 NOTE — Progress Notes (Signed)
Safety precautions to be maintained throughout the outpatient stay will include: orient to surroundings, keep bed in low position, maintain call bell within reach at all times, provide assistance with transfer out of bed and ambulation.  

## 2016-01-29 NOTE — Progress Notes (Signed)
Patient's Name: Molly Peters MRN: [id number] DOB: August 10, 1961 DOS: 01/29/2016  Primary Reason(s) for Visit: Initial Patient Evaluation CC: Neck Pain; Back Pain; Knee Pain; and Hand Pain   HPI  Molly Peters is a 55 y.o. year old, female patient, who comes today for an initial evaluation. She has HYPERLIPIDEMIA-MIXED; Mood disorder ([place]); Tobacco use disorder; DEPRESSION; GLAUCOMA; HYPERTENSION; Valvular heart disease; HEMORRHOIDS; COPD (chronic obstructive pulmonary disease) with chronic bronchitis ([place]); GERD; GASTRIC ULCER; IRRITABLE BOWEL SYNDROME; Seizure disorder ([place]); Restless legs syndrome (RLS); CAROTID BRUIT, LEFT; Routine general medical examination at a health care facility; Preoperative cardiovascular examination; Osteoarthritis of shoulder (Right); S/P Bilateral Total Shoulder Replacement (2016); Osteoarthritis of shoulder (Left); Chronic pain; Marijuana use; Long term current use of opiate analgesic; Long term prescription opiate use; Opiate use; Encounter for therapeutic drug level monitoring; Encounter for pain management planning; Chronic shoulder pain (S/P replacement) (Location of Primary Source of Pain) (Bilateral) (L>R); Chronic low back pain (S/P L3-L5 Fusion) (Location of Secondary source of pain) (Bilateral) (R>L); 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 Her primarily concern today is the Neck Pain; Back Pain; Knee Pain; and Hand Pain   The patient comes in today clinics today for the first time for a chronic pain management evaluation. According to the patient the worst of her pains is in the area of both shoulders with  the left being worst on the right. On 2016 the patient had both shoulders replaced, the last one having been replaced 5 months ago. Following this her secondary area of worst pain is that of the lower back with the right being worst on the left. Here, the patient has a history significant for an L3-L5 fusion by Dr. Cyndy Peters on 2013. Next is the upper back pain with most of the pain being in the center but also traveling to the sides with the left being worst on the right. Next is the upper extremity pain with the left being worst on the right. In the case of the left upper extremity the pain is in the upper arm going down to the elbow but no further than that. Indicators of the right upper extremity the pain goes all the way down into her hands with pain and tingling in the area of the index, middle, ring, and pinky fingers. Next, she complains of neck pain with most of the pain being in the central posterior aspect of the neck both with a component going to both sides with the right being worst on the left. Of significance here is the fact that she had a left-sided ACDF done approximately 4-5 years ago at [facility] by Dr. Cyndy Peters. Finally, there is the pain in both of her knees with the right being worst on the left. The pain pattern for both knees is the same with the pain being primarily in the medial and posterior aspect of the knees. She indicates having had injections with steroids done by Dr. Edilia Bo at the left by our orthopedics, at about our health care in [place] [address].  Reported Pain Score: 6 clinically she looks like a 2-3/10. Reported level is inconsistent with clinical obrservations. Pain Type: Chronic pain Pain Location: Neck Pain Descriptors / Indicators: Throbbing, Constant  Pain Frequency: Constant  Onset and Duration: Gradual, Date of onset: 10 years and Present longer than 3 months Cause of pain: Work related accident or event around 2002 Severity: No change since onset, NAS-11 at its  worse: 9/10, NAS-11 at its best: 5/10, NAS-11 now: 6/10 and NAS-11 on the average: 7/10 Timing: Morning, Noon, Afternoon, Evening, Night, Not influenced by the time of the day, During activity or exercise, After activity or exercise and After a period of immobility Aggravating Factors: Bending, Climbing, Intercourse (sex), Kneeling, Lifiting, Motion, Prolonged sitting, Prolonged standing, Squatting, Stooping , Twisting, Walking, Walking uphill and Walking downhill Alleviating Factors: Bending, Cold packs, Hot packs, Medications and Walking Associated Problems: Day-time cramps, Night-time cramps, Depression, Fatigue, Inability to concentrate, Numbness, Spasms, Tingling, Weakness and Pain that wakes patient up Quality of Pain: Agonizing, Deep, Disabling, Distressing, Getting longer, Heavy, Sharp, Shooting, Superficial, Throbbing and Tingling Previous Examinations or Tests: Bone scan, CT scan, Nerve conduction test, Neurosurgical evaluation and Orthoperdic evaluation Previous Treatments: Epidural steroid injections, Facet blocks, Narcotic medications and Trigger point injections  Historic Controlled Substance Pharmacotherapy Review  Previously Prescribed Opioids: Currently the patient appears not to be taking any opioids. However, review of the [place] would suggest that the patient has been taking oxycodone/APAP up to 10/325 up to 5 per day (50 mg/day) Analgesic: oxycodone/APAP up to 10/325 up to 5 per day (50 mg/day) MME/day: 75 mg/day Pharmacokinetics: Onset of action (Liberation/Absorption): Within expected pharmacological parameters. (One hour) Time to Peak effect (Distribution): Timing and results are as within normal expected parameters. (3-4 hours) Duration of action (Metabolism/Excretion): Within normal limits for medication. (6 hours) Pharmacodynamics: Analgesic Effect: 80% Activity Facilitation: Medication(s) allow patient to sit, stand,  walk, and do the basic ADLs Perceived Effectiveness: Described as relatively effective, allowing for increase in activities of daily living (ADL) Side-effects or Adverse reactions: None reported Historical Background Evaluation: [place] PDMP: Five (5) year initial data search conducted. Historical Hospital-associated UDS Results:  No results found for: THCU, COCAINSCRNUR, PCPSCRNUR, MDMA, AMPHETMU, METHADONE, ETOH UDS Results: No UDS available, at this time UDS Interpretation: No UDS available, at this time Medication Assessment Form: Not applicable. Initial evaluation. The patient has not received any medications from our practice Treatment compliance: Not applicable. Initial evaluation Risk Assessment: Aberrant Behavior: None observed today and     Opioid Fatal Overdose Risk Factors: None detected today and     Substance Use Disorder (SUD) Risk Level: Pending results of Medical Psychology Evaluation for SUD Opioid Risk Tool (ORT) Score: Total Score: 1 Low Risk for SUD (Score <3) Depression Scale Score: PHQ-2: PHQ-2 Total Score: 0 No depression (0) PHQ-9: PHQ-9 Total Score: 0 No depression (0-4)  Pharmacologic Plan: Pending ordered tests and/or consults  Neuromodulation Therapy Review  Type: No neuromodulatory devices implanted Side-effects or Adverse reactions: No device reported Effectiveness: No device reported  Allergies  Ms. Mauthe is allergic to carbamazepine; divalproex sodium; clonazepam; divalproex sodium; diphenhydramine hcl; diphenhydramine hcl; tiotropium; tiotropium bromide monohydrate; and vicodin.  Meds  The patient has a current medication list which includes the following prescription(s): alprazolam, baclofen, gabapentin, hydroxyzine, lamotrigine, losartan-hydrochlorothiazide, omeprazole, paroxetine, and potassium chloride. Requested Prescriptions    No prescriptions requested or ordered in this encounter    ROS  Cardiovascular History: Hypertension, Heart murmur,  Heart valve problems and Needs antibiotics prior to dental procedures Pulmonary or Respiratory History: Lung problems, Emphysema, Shortness of breath, Smoker and Snoring  Neurological History: Seizure disorder, Convulsions, Epilepsy (Date of last attack: Unknown)  and Scoliosis Psychological-Psychiatric History: Anxiety, Depression, Panic Attacks and Insomnia Gastrointestinal History: Ulcers, Reflux or heatburn and Irritable Bowel Syndrome (IBS) Genitourinary History: Negative for nephrolithiasis, hematuria, renal failure or chronic kidney disease Hematological History: Negative for anticoagulant therapy, anemia, bruising or bleeding easily, hemophilia, sickle cell disease or trait, thrombocytopenia or coagulupathies Endocrine History: Negative for diabetes or thyroid disease Rheumatologic History: Osteoarthritis and Fibromyalgia Musculoskeletal History: Negative for myasthenia gravis, muscular dystrophy, multiple sclerosis or malignant hyperthermia Work History: Legally disabled around 2004.  [place]  Medical:  Ms. Wiese  has a past medical history of Depression; GERD (gastroesophageal reflux disease); Anxiety; Valvular heart disease; Fibromyalgia; IBS (irritable bowel syndrome); Seizures ([place]); History of gastric ulcer; Hypertension; Internal hemorrhoid; COPD (chronic obstructive pulmonary disease) ([place]); Osteoarthritis of right shoulder (11/2014); Family history of adverse reaction to anesthesia; TMJ syndrome; Limited joint range of motion; Wears dentures; Difficulty swallowing pills; Short-term memory loss; Localized primary osteoarthritis of right shoulder region (11/23/2014); Osteoarthritis of left shoulder (07/05/2015); and Chronic back pain (11/12/2009). Family: family history includes Anesthesia problems in her mother and sister; Arthritis in her mother; Asthma in her daughter; Cancer in her father; Cervical cancer in her mother and sister; Coronary artery disease in her maternal grandmother and  paternal grandmother; Diabetes in her maternal grandmother and paternal grandmother; Healthy in her father; Hypertension in her maternal grandmother and paternal grandmother; Migraines in her brother and sister. Surgical:  has past surgical history that includes Abdominal hysterectomy; Esophagogastroduodenoscopy (09/28/2008); NM MYOVIEW [employer]TD; Anterior cervical decomp/discectomy fusion (02/06/2011); Colonoscopy (09/28/2008); Shoulder arthroscopy with debridement and bicep tendon repair (Right, 04/13/2014); Cesarean section; Breast lumpectomy (Bilateral, 1989); Laminectomy with posterior lateral arthrodesis level 3 (12/06/2009); Cardiac catheterization (1995); Laminectomy with posterior lateral arthrodesis level 3 (11/17/2006); Hardware Removal (11/17/2006); Total shoulder arthroplasty (Right, 11/23/2014); and Total shoulder arthroplasty (Left, 07/05/2015). Tobacco:  reports that she has been smoking Cigarettes.  She has a 63 pack-year smoking history. She has never used smokeless tobacco. Alcohol:  reports that she does not drink alcohol. Drug:  reports that she does not use illicit drugs.  Physical Exam  Vitals:  Today's Vitals   01/29/16 1514 01/29/16 1515  BP:  138/79  Height: 5\' 1"  (1.549 m)   Weight: 106 lb (48.081 kg)   PainSc: 6  6   PainLoc: Neck     Calculated BMI: Body mass index is 20.04 kg/(m^2).     General appearance: alert, cooperative, appears stated age and no distress Eyes: PERLA Respiratory: No evidence respiratory distress, no audible rales or ronchi and no use of accessory muscles of respiration  Cervical Spine Inspection: Scar from the ACDF surgery can be appreciated on the left anterior aspect of the patient's neck. This scar is well-healed and appears to have normal abnormalities. Alignment: Symetrical ROM: Decreased  Upper Extremities Inspection: No gross anomalies detected ROM: Decreased for both shoulders Sensory: Movement associated pain Motor: Grossly normal Pulses:  Palpable  Thoracic Spine Inspection: No gross anomalies detected Alignment: Symetrical ROM: Adequate Palpation: WNL  Lumbar Spine Inspection: No gross anomalies detected Alignment: Symetrical ROM: Adequate Palpation: WNL Provocative Tests: Lumbar Hyperextension and rotation test: deferred Patrick's Maneuver: deferred Gait: WNL  Lower Extremities Inspection: No gross anomalies detected ROM: Adequate Sensory: Normal Motor: Unremarkable  Toe walk (S1): WNL  Heal walk (L5): WNL  Assessment  Primary Diagnosis & Pertinent Problem List: The primary encounter diagnosis was Chronic pain. 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  Visit Diagnosis: 1. Chronic pain   2. Marijuana use   3. Long term current use of opiate analgesic   4. Long term prescription opiate use   5. Opiate use   6. Encounter for therapeutic drug level monitoring   7. Encounter for pain management planning   8. Chronic shoulder pain (S/P replacement) (Location of Primary Source of Pain) (Bilateral) (L>R)   9. Chronic low back pain (S/P L3-L5 Fusion) (Location of Secondary source of pain) (Bilateral) (R>L)   10.  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 Fibromyalgia   12. Chronic pain of both upper extremities   13. Chronic upper back pain (Location of Tertiary source of pain) (Central) (L>R)   14. Chronic neck pain (posterior/central) (Bilateral) (R>L)   15. Hx of cervical spine surgery (Left ACDF)   16. Chronic knee pain (Bilateral) (R>L)   17. Restless legs syndrome (RLS)   18. Chronic intermittent jaw pain   19. Tobacco use disorder     Assessment: Chronic knee pain (Bilateral) (R>L) In the case of both knees the pain is located in the medial and steer aspect of the knees. The patient indicates the right being worst on the left. She also indicates having had some injections by Dr. Edilia Bo ad lib. our orthopedics [place] [address]  Chronic upper extremity pain (Bilateral) (L>R) In the case of the left upper extremity the pain goes down to the elbow stained primarily in the upper arm. In the case of the right upper extremity he goes all the way down to the hand over the area of the thumb suggesting a possible C6 dermatomal pain. In addition, the patient complains of pain in the index finger, middle finger, ring finger, and pinky finger.  Hx of cervical spine surgery (Left ACDF) Surgery done around 2012 at [facility] by Dr. Cyndy Peters  Chronic shoulder pain (S/P replacement) (Location of Primary Source of Pain) (Bilateral) (L>R) Right sided total shoulder replacement in 2016. Left total shoulder replacement in 2016.    Plan of Care  Note: As per protocol, today's visit has been an evaluation only. We have not taken over the patient's controlled substance management.  Pharmacotherapy (Medications Ordered): No orders of the defined types were placed in this encounter.    Lab-work & Procedure Ordered: Orders Placed This Encounter  Procedures  . Compliance Drug Analysis, Ur    Volume: 30 ml(s). Minimum 3 ml of urine is needed. Document temperature of fresh  sample. Indications: Long term (current) use of opiate analgesic (Z79.891) Test#: [id number] (Comprehensive Profile)  . C-reactive protein    Standing Status: Future     Number of Occurrences:      Standing Expiration Date: 02/28/2016  . Magnesium    Standing Status: Future     Number of Occurrences:      Standing Expiration Date: 02/28/2016  .  Sedimentation rate    Standing Status: Future     Number of Occurrences:      Standing Expiration Date: 02/28/2016  . Vitamin B12    Standing Status: Future     Number of Occurrences:      Standing Expiration Date: 02/28/2016  . Vitamin D 1,25 dihydroxy    Standing Status: Future     Number of Occurrences:      Standing Expiration Date: 02/28/2016  . Ambulatory referral to Psychology    Referral Priority:  Routine    Referral Type:  Psychiatric    Referral Reason:  Specialty Services Required    Referred to Provider:  Beckey Rutter, PHD    Requested Specialty:  Psychology    Number of Visits Requested:  1    Imaging Ordered: AMB REFERRAL TO PSYCHOLOGY  Interventional Therapies: Scheduled: None at this time. PRN Procedures: None at this time.    Referral(s) or Consult(s): Medical psychology consult for several substance use disorder evaluation.  Medications administered during this visit: Ms. Hibben had no medications administered during this visit.  Prescriptions ordered during this visit: New Prescriptions   No medications on file    Future Appointments Date Time Provider [place]  02/03/2016 10:00 AM Dickie La, RN [facility] None  06/30/2016 11:00 AM Venia Carbon, MD [facility]    Primary Care Physician: Viviana Simpler, MD Location: [facility] Facility Note by: Kathlen Brunswick. Dossie Arbour, M.D, DABA, DABAPM, DABPM, DABIPP, FIPP

## 2016-01-31 ENCOUNTER — Other Ambulatory Visit: Payer: Self-pay | Admitting: *Deleted

## 2016-01-31 NOTE — Patient Outreach (Signed)
[facility]) Care Management  01/31/2016  AKAYLIA MATHIES July 14, 1961 [id number]   Referral for higher ED utilizer:  Telephone call to patient; left message on voice mail requesting return call.  Plan: Will follow up. Sherrin Daisy, RN BSN [place] Management Coordinator [facility] Care Management  [phone]

## 2016-02-02 ENCOUNTER — Encounter: Payer: Self-pay | Admitting: Pain Medicine

## 2016-02-02 DIAGNOSIS — M961 Postlaminectomy syndrome, not elsewhere classified: Secondary | ICD-10-CM | POA: Insufficient documentation

## 2016-02-02 DIAGNOSIS — M79603 Pain in arm, unspecified: Secondary | ICD-10-CM

## 2016-02-02 DIAGNOSIS — M545 Low back pain: Secondary | ICD-10-CM

## 2016-02-02 DIAGNOSIS — M549 Dorsalgia, unspecified: Secondary | ICD-10-CM

## 2016-02-02 DIAGNOSIS — M25511 Pain in right shoulder: Secondary | ICD-10-CM

## 2016-02-02 DIAGNOSIS — G8929 Other chronic pain: Secondary | ICD-10-CM | POA: Insufficient documentation

## 2016-02-02 DIAGNOSIS — M797 Fibromyalgia: Secondary | ICD-10-CM | POA: Insufficient documentation

## 2016-02-02 DIAGNOSIS — M25512 Pain in left shoulder: Secondary | ICD-10-CM

## 2016-02-02 DIAGNOSIS — M25562 Pain in left knee: Secondary | ICD-10-CM

## 2016-02-02 DIAGNOSIS — M25561 Pain in right knee: Secondary | ICD-10-CM

## 2016-02-02 DIAGNOSIS — Z9889 Other specified postprocedural states: Secondary | ICD-10-CM | POA: Insufficient documentation

## 2016-02-02 DIAGNOSIS — R6884 Jaw pain: Secondary | ICD-10-CM

## 2016-02-02 DIAGNOSIS — M542 Cervicalgia: Secondary | ICD-10-CM

## 2016-02-02 NOTE — Assessment & Plan Note (Signed)
Surgery done around 2012 at [facility] by Dr. Cyndy Freeze

## 2016-02-02 NOTE — Assessment & Plan Note (Signed)
In the case of the left upper extremity the pain goes down to the elbow stained primarily in the upper arm. In the case of the right upper extremity he goes all the way down to the hand over the area of the thumb suggesting a possible C6 dermatomal pain. In addition, the patient complains of pain in the index finger, middle finger, ring finger, and pinky finger.

## 2016-02-02 NOTE — Assessment & Plan Note (Signed)
In the case of both knees the pain is located in the medial and steer aspect of the knees. The patient indicates the right being worst on the left. She also indicates having had some injections by Dr. Edilia Bo ad lib. our orthopedics in [facility]

## 2016-02-02 NOTE — Assessment & Plan Note (Signed)
Right sided total shoulder replacement in 2016. Left total shoulder replacement in 2016.

## 2016-02-03 ENCOUNTER — Other Ambulatory Visit: Payer: Self-pay | Admitting: *Deleted

## 2016-02-03 NOTE — Patient Outreach (Signed)
[facility]) Care Management  02/03/2016  Molly Peters March 08, 1961 [id number]   Return call from patient after receiving voice message. Patient was advised of reason for call. HIPPA verification received. Patient was advised of [facility] care management services and screening assessment was completed as noted.   Patient voices that health conditions include COPD, HTN, leaky heart valve, epilepsy, osteoarthritis (both shoulders have been replaced), restless leg syndrome, chronic pain. Patient voices that she uses mail order pharmacy & local pharmacy and is obtaining medications without problems. States she drives self to local MD appointments and spouse takes her to appointments out of town.  Patient voices that she is aware of importance of taking medications as prescribed by doctors. States she manages her own medications.   Patient states most of health conditions are under control but she wishes to learn more about handling COPD condition that she was diagnosed with several years ago.  States she does receive calls from telephonic nurse occasionally but was unable to advise what calls involved. Patient was unfamiliar with COPD action plan (yellow and red zones).  Patient consents to referral to telephonic Health Coach.   Plan: Refer back to Health Coach for disease management.  Telephonic care coordinator signing off.

## 2016-02-06 LAB — COMPLIANCE DRUG ANALYSIS, UR: PDF: 0

## 2016-02-12 ENCOUNTER — Encounter: Payer: Self-pay | Admitting: Pain Medicine

## 2016-02-12 DIAGNOSIS — R892 Abnormal level of other drugs, medicaments and biological substances in specimens from other organs, systems and tissues: Secondary | ICD-10-CM

## 2016-02-12 NOTE — Progress Notes (Signed)
Quick Note:  NOTE: This forensic urine drug screen (UDS) test was conducted using a state-of-the-art ultra high performance liquid chromatography and mass spectrometry system (UPLC/MS-MS), the most sophisticated and accurate method available. UPLC/MS-MS is 1,000 times more precise and accurate than standard gas chromatography and mass spectrometry (GC/MS). This system can analyze 26 drug categories and 180 drug compounds.  The results of this test came back with unexpected findings. Unreported cannabinoids. Based on the results of this test we will no longer be available to prescribe any type of controlled substances to this patient. ______

## 2016-02-18 ENCOUNTER — Other Ambulatory Visit
Admission: RE | Admit: 2016-02-18 | Discharge: 2016-02-18 | Disposition: A | Discharge status: Home / Self Care | Payer: Medicare PPO | Source: Ambulatory Visit | Attending: Pain Medicine | Admitting: Pain Medicine

## 2016-02-18 DIAGNOSIS — G8929 Other chronic pain: Secondary | ICD-10-CM | POA: Diagnosis not present

## 2016-02-18 LAB — MAGNESIUM: MAGNESIUM: 1.9 mg/dL (ref 1.7–2.4)

## 2016-02-18 LAB — VITAMIN B12: VITAMIN B 12: 197 pg/mL (ref 180–914)

## 2016-02-18 LAB — C-REACTIVE PROTEIN

## 2016-02-18 LAB — SEDIMENTATION RATE: SED RATE: 14 mm/h (ref 0–30)

## 2016-02-19 LAB — VITAMIN D 25 HYDROXY (VIT D DEFICIENCY, FRACTURES): Vit D, 25-Hydroxy: 10.5 ng/mL — ABNORMAL LOW (ref 30.0–100.0)

## 2016-02-28 ENCOUNTER — Other Ambulatory Visit: Payer: Self-pay | Admitting: *Deleted

## 2016-02-28 NOTE — Patient Outreach (Signed)
[facility]) Care Management  02/28/2016  JAHMIYA MCLAIN 07/03/1961 [id number]   Telephone call to patient;  left message on voice mail requesting call back.  Plan: Will follow up.  Sherrin Daisy, RN BSN [place] Management Coordinator [facility] Care Management  [phone]

## 2016-03-03 ENCOUNTER — Other Ambulatory Visit: Payer: Self-pay | Admitting: *Deleted

## 2016-03-03 NOTE — Patient Outreach (Signed)
[facility]) Care Management  03/03/2016  DANAEJAH JUBY 08/09/1961 [id number]  Telephone call to patient; person who answered call requested to take information and have patient return call.  Plan:  Will follow up.  Sherrin Daisy, RN BSN [place] Management Coordinator [facility] Care Management  [phone]

## 2016-03-05 ENCOUNTER — Other Ambulatory Visit: Payer: Self-pay | Admitting: *Deleted

## 2016-03-07 NOTE — Patient Outreach (Signed)
[facility]) Care Management  03/07/2016  LADAYSHA MASLOSKI 1961/06/05 [id number]   Late entry/telephone cal to patient on 03/05/2016. Patient voices HIPPA verification.  Patient voices that she does have COPD and would like to learn more about her condition. Patient questioned regarding what zone she was in today.She was unable to states zone and was not familiar with indications for specific zones. States she has used albuterol  2 times this week. States she is currently not on daily medication for COPD. States Symbicort and Advair gave her yeast infections. Patient voices that she is smoking 1 pack of cigarettes daily and that spouse also smokes. States primary care provider manages her COPD.   Plan:  Start Health assessments. Refer to Health Coach for disease management.     Sherrin Daisy, RN BSN [place] Management Coordinator [facility] Care Management  [phone]

## 2016-03-15 ENCOUNTER — Encounter: Payer: Self-pay | Admitting: Pain Medicine

## 2016-03-15 ENCOUNTER — Other Ambulatory Visit: Payer: Self-pay | Admitting: Pain Medicine

## 2016-03-15 DIAGNOSIS — E559 Vitamin D deficiency, unspecified: Secondary | ICD-10-CM

## 2016-03-15 MED ORDER — VITAMIN D3 50 MCG (2000 UT) PO CAPS: ORAL_CAPSULE | ORAL | Status: DC

## 2016-03-15 MED ORDER — VITAMIN D (ERGOCALCIFEROL) 1.25 MG (50000 UNIT) PO CAPS: ORAL_CAPSULE | ORAL | Status: DC

## 2016-03-15 NOTE — Progress Notes (Signed)

## 2016-03-26 ENCOUNTER — Other Ambulatory Visit: Payer: Self-pay | Admitting: Internal Medicine

## 2016-03-26 NOTE — Telephone Encounter (Signed)
Pt has f/u scheduled on 06/30/16, xanax last filled on 12/12/15 #270 with 0 refills, and paxil last filled on 04/09/15 #90 with 3 additional refills, since it's a mail order pharmacy if approved the xanax needs to be a paper Rx we have to fax to them, please advise

## 2016-03-27 NOTE — Telephone Encounter (Signed)
Rx written and signed. Faxed to [facility].

## 2016-04-09 ENCOUNTER — Other Ambulatory Visit: Payer: Self-pay | Admitting: *Deleted

## 2016-04-09 NOTE — Patient Outreach (Signed)
[facility]) Care Management  04/09/2016  Molly Peters 1961-01-01 [id number]  RN Health Coach  Attempted #1 initial  Follow up outreach call to patient.  Patient was unavailable. HIPPA compliance voicemail message was left with return callback number.    [place] Care Management [phone]

## 2016-04-13 ENCOUNTER — Ambulatory Visit: Payer: Self-pay | Admitting: *Deleted

## 2016-04-20 ENCOUNTER — Other Ambulatory Visit: Payer: Self-pay | Admitting: *Deleted

## 2016-04-20 DIAGNOSIS — J441 Chronic obstructive pulmonary disease with (acute) exacerbation: Secondary | ICD-10-CM

## 2016-04-20 NOTE — Patient Outreach (Addendum)
[facility]) Care Management  [place]  04/20/2016   Molly Peters December 13, 1960 [id number]  Subjective: RN Health Coach telephone call to patient.  Hipaa compliance verified. Per patient she is having some shortness of breath on exertion.  She stated when she went to walk the dog it  was hard on her to breath.  Patient does not know what the zones or action plans are.  Per patient she is currently smoking a pack a day. Patient stated that she had stopped for 15 months at the longest time.  Patient is under a lot of stress.  Two of her children have moved back in with her. Patient does have depression and is on medication. Per patient she tried to go off the Paxil but the Dr told her she would never be able to come off it. Patient weighs 106 pounds and stated that she is 5 foot 2 inches. Patient uses a rescue inhaler. Patient stated her primary care physician manges her COPD. Patient tries to eat a low sodium diet but had not heard of DASH . Patient states that some of her medication she has been taking and some not taking as ordered. RN discussed the importance on medication adherence. Per patient she is having some problem affording some of her medications and would like to know if there is any help out there for her. Patient has agreed to follow up out reach calls.  Objective:   Encounter Medications:  Outpatient Encounter Prescriptions as of 04/20/2016  Medication Sig Note  . ALPRAZolam (XANAX) 1 MG tablet TAKE 1 TABLET THREE TIMES DAILY AS NEEDED FOR ANXIETY   . baclofen (LIORESAL) 10 MG tablet Take 1 tablet (10 mg total) by mouth 3 (three) times daily. As needed for muscle spasm 04/20/2016: Patient has not been taking everyday  . Cholecalciferol (VITAMIN D3) 2000 units capsule Take 1 capsule (2,000 Units total) by mouth daily.   Marland Kitchen gabapentin (NEURONTIN) 300 MG capsule TAKE 1 CAPSULE TWICE DAILY AND TAKE 2 CAPSULES AT BEDTIME.   . hydrOXYzine (ATARAX/VISTARIL) 50 MG  tablet Take 1 tablet (50 mg total) by mouth 3 (three) times daily as needed for itching.   . lamoTRIgine (LAMICTAL) 100 MG tablet Take 1 tablet (100 mg total) by mouth 2 (two) times daily.   Marland Kitchen losartan-hydrochlorothiazide (HYZAAR) 100-12.5 MG per tablet TAKE 1 TABLET EVERY DAY   . omeprazole (PRILOSEC) 20 MG capsule TAKE 1 CAPSULE EVERY DAY 04/20/2016: Pt not taking everyday  . PARoxetine (PAXIL) 20 MG tablet TAKE 1 TABLET EVERY MORNING   . Vitamin D, Ergocalciferol, (DRISDOL) 50000 units CAPS capsule Take 1 capsule (50,000 Units total) by mouth 2 (two) times a week. X 6 weeks.   . potassium chloride (K-DUR) 10 MEQ tablet Take 10 mEq by mouth 2 (two) times daily. Reported on 04/20/2016 04/20/2016: Per patient she needs refilled   No facility-administered encounter medications on file as of 04/20/2016.    Functional Status:  In your present state of health, do you have any difficulty performing the following activities: 04/20/2016 03/05/2016  Hearing? N N  Vision? N N  Difficulty concentrating or making decisions? Tempie Donning  Walking or climbing stairs? Y Y  Dressing or bathing? N N  Doing errands, shopping? N Y  Conservation officer, nature and eating ? N N  Using the Toilet? N N  In the past six months, have you accidently leaked urine? Y Y  Do you have problems with loss of bowel control? Darreld Mclean  Y  Managing your Medications? N N  Managing your Finances? N Y  Housekeeping or managing your Housekeeping? Tempie Donning    Fall/Depression Screening: PHQ 2/9 Scores 04/20/2016 03/05/2016 01/29/2016  PHQ - 2 Score 3 0 0  PHQ- 9 Score 10 - -  Exception Documentation - - Medical reason   [facility] CM Care Plan Problem One        Most Recent Value   Care Plan Problem One  Knowledge deficit in self management of COPD   Role Documenting the Problem One  [facility] [place] for Problem One  Active   THN Long Term Goal (31-90 days)  Patient will not have any admissions for COPD within the next 90 days   THN Long Term Goal Start Date   04/20/16   Interventions for Problem One [place] reminded patient to keep appointments with PCP as per order. RN Health Coach reminded patient the importance of taking medications as per physician order. RN will monitor patient monthly telephonic   THN CM Short Term Goal #1 (0-30 days)  Patient wil be able to verbalize what the zones  and action plan of COPD are within the next 30 days   THN CM Short Term Goal #1 Start Date  04/20/16   Interventions for Short Term Goal #1  RN sent patient EMMI information on COPD. RN sent patient a zone magnet for refrigerator. RN sent patient eduational material on COPD exacerbation. RN Sent EMMI on COPD what you can do and When to get help. RN will follow up with discussion and rteach back within a month.   THN CM Short Term Goal #2 (0-30 days)  Patient will be able to verbalize what purse lip breathing is within 30 days   THN CM Short Term Goal #2 Start Date  04/20/16   Interventions for Short Term Goal #2  RN sent patient chart and educational material on purselip breathing. RN will follow up with patient within a month.   THN CM Short Term Goal #3 (0-30 days)  Patient will report receiving educational material on Quit smoking within the next 30 days   THN CM Short Term Goal #3 Start Date  04/20/16   Interventions for Short Tern Goal #3  RN will send patient educational material on smoking cessation Thinking about Quit smolking. RN will follow up with discussion to see what is needed and the patient goal       Assessment:  Patient does not know what the zones mean Patient is not taking all medications as prescribed Patient is currently smoking Patient is having some difficulty affording medications Patient will benefit from [place] telephonic outreach for education and support for COPD self management.  Plan:  RN sent patient educational material on COPD exacerbation RN sent patient educational material on purse lip  breathing RN sent a large refrigerator magnet of COPD Zones and action plan RN discussed zones and action plan RN sent education material on smoking cessation Referred to pharmacy RN discussed the importance of medication adherence RN will follow up within 30 days for teach back and discussion RN will send barrier letter to [place] [facility] [phone]

## 2016-04-22 ENCOUNTER — Encounter: Payer: Self-pay | Admitting: *Deleted

## 2016-04-22 NOTE — Addendum Note (Signed)
Addended by: Verlin Grills on: 04/22/2016 10:33 AM   Modules accepted: Orders

## 2016-04-29 ENCOUNTER — Other Ambulatory Visit: Payer: Self-pay | Admitting: Pharmacist

## 2016-04-29 NOTE — Patient Outreach (Signed)
Molly Peters was referred to pharmacy for medication assistance. Left a HIPAA compliant message on the patient's voicemail. If have not heard from patient by 05/01/16, will give her another call at that time.  Harlow Asa, PharmD Clinical Pharmacist [place] Management [phone]

## 2016-05-01 ENCOUNTER — Other Ambulatory Visit: Payer: Self-pay | Admitting: Pharmacist

## 2016-05-01 NOTE — Patient Outreach (Signed)
Molly Peters was referred to pharmacy for medication assistance. Called and spoke with patient. HIPAA identifiers verified and verbal consent received. Molly Peters reports that she gets her medications through mail order pharmacy. Reports that she previously had difficulty with affording her medication because she needed several at one time and each was for a 90 day supply copay, for a total of $98 at once. Reports that she now recognizes that she just needs to plan ahead for these 90 day supply copays. Reports that she currently has all of her medications and no further concerns about being able to afford them.  Patient reports that she has no medication questions or concerns for me at this time. Provide patient with my phone number. Will close pharmacy episode at this time.  Harlow Asa, PharmD Clinical Pharmacist [place] [facility] [phone]

## 2016-05-19 ENCOUNTER — Encounter: Payer: Self-pay | Admitting: *Deleted

## 2016-05-19 ENCOUNTER — Other Ambulatory Visit: Payer: Self-pay | Admitting: *Deleted

## 2016-05-19 DIAGNOSIS — J441 Chronic obstructive pulmonary disease with (acute) exacerbation: Secondary | ICD-10-CM

## 2016-05-19 NOTE — Patient Outreach (Signed)
[facility]) Care Management  [place]  05/19/2016   Molly Peters 03-02-1961 [id number]  Subjective: RN Health Coach telephone call to patient.  Hipaa compliance verified. Patient is talking and RN noted a cough. Per patient she has productive cough of white foamy sputum with sometimes a yellow tinge. Patient is currently still smoking. RN explained to patient the free classes that are available and will send patient additional information. Per patient she was prescribed inhalers and uses the rescue inhaler albuterol. Per patient she could not afford the Symbicort inhaler and has not gotten it refilled. Patient had spoken with [facility] pharmacist and didn't tell her about this. Per patient she is going to talk to the Dr about nebulizer treatments. Patient states that she doesn't sleep well at night usually goes to sleep around 3 am in the morning. Patient states he appetite is not very good. Per patient she doesn't take any additional nutritional supplements. RN offered patient coupons for Ensure and patient declined. RN instructed the patient to call the physician since she is still having the constant cough. Patient has agreed to follow up outreach calls.  Objective:   Encounter Medications:  Outpatient Encounter Prescriptions as of 05/19/2016  Medication Sig Note  . ALPRAZolam (XANAX) 1 MG tablet TAKE 1 TABLET THREE TIMES DAILY AS NEEDED FOR ANXIETY   . baclofen (LIORESAL) 10 MG tablet Take 1 tablet (10 mg total) by mouth 3 (three) times daily. As needed for muscle spasm 04/20/2016: Patient has not been taking everyday  . Cholecalciferol (VITAMIN D3) 2000 units capsule Take 1 capsule (2,000 Units total) by mouth daily.   Marland Kitchen gabapentin (NEURONTIN) 300 MG capsule TAKE 1 CAPSULE TWICE DAILY AND TAKE 2 CAPSULES AT BEDTIME.   . hydrOXYzine (ATARAX/VISTARIL) 50 MG tablet Take 1 tablet (50 mg total) by mouth 3 (three) times daily as needed for itching.   . lamoTRIgine  (LAMICTAL) 100 MG tablet Take 1 tablet (100 mg total) by mouth 2 (two) times daily.   Marland Kitchen losartan-hydrochlorothiazide (HYZAAR) 100-12.5 MG per tablet TAKE 1 TABLET EVERY DAY   . omeprazole (PRILOSEC) 20 MG capsule TAKE 1 CAPSULE EVERY DAY 04/20/2016: Pt not taking everyday  . PARoxetine (PAXIL) 20 MG tablet TAKE 1 TABLET EVERY MORNING   . potassium chloride (K-DUR) 10 MEQ tablet Take 10 mEq by mouth 2 (two) times daily. Reported on 04/20/2016 04/20/2016: Per patient she needs refilled  . Vitamin D, Ergocalciferol, (DRISDOL) 50000 units CAPS capsule Take 1 capsule (50,000 Units total) by mouth 2 (two) times a week. X 6 weeks.    No facility-administered encounter medications on file as of 05/19/2016.    Functional Status:  In your present state of health, do you have any difficulty performing the following activities: 04/20/2016 03/05/2016  Hearing? N N  Vision? N N  Difficulty concentrating or making decisions? Tempie Donning  Walking or climbing stairs? Y Y  Dressing or bathing? N N  Doing errands, shopping? N Y  Conservation officer, nature and eating ? N N  Using the Toilet? N N  In the past six months, have you accidently leaked urine? Y Y  Do you have problems with loss of bowel control? Y Y  Managing your Medications? N N  Managing your Finances? N Y  Housekeeping or managing your Housekeeping? Tempie Donning    Fall/Depression Screening: PHQ 2/9 Scores 05/19/2016 04/20/2016 03/05/2016 01/29/2016  PHQ - 2 Score 3 3 0 0  PHQ- 9 Score - 10 - -  Exception Documentation - - - Medical reason   [facility] CM Care Plan Problem One        Most Recent Value   Care Plan Problem One  Knowledge deficit in self management of COPD   Role Documenting the Problem One  Health [place] for Problem One  Active   THN Long Term Goal (31-90 days)  Patient will continue to not have any admissions for COPD within the next 90 days   Interventions for Problem One Long Term Goal  RN Health Coach reminded patient to keep appointments with PCP  as per order. RN Health Coach reminded patient the importance of taking medications as per physician order. RN will monitor patient monthly telephonic   THN CM Short Term Goal #1 (0-30 days)  Patient wil be able to verbalize what the zones  and action plan of COPD are within the next 30 days   Interventions for Short Term Goal #1  RN sent patient EMMI information on COPD. RN sent patient a zone magnet for refrigerator. RN sent patient eduational material on COPD exacerbation. RN Sent EMMI on COPD what you can do and When to get help. RN will follow up with discussion and rteach back within a month.   THN CM Short Term Goal #2 Met Date  05/19/16   Interventions for Short Term Goal #2  RN sent patient chart and educational material on purselip breathing. RN will follow up with patient within a month.   THN CM Short Term Goal #3 (0-30 days)  Patient will report receiving educational material on Quit smoking within the next 30 days   THN CM Short Term Goal #3 Start Date  05/19/16   Interventions for Short Tern Goal #3  RN will send patient educational material on smoking cessation Thinking about Quit smolking. RN will follow up with discussion to see what is needed and the patient goal      Assessment:  Patient is not using all inhalers as ordered Patient is currently smoking Patient will continue to benefit from [place] telephonic outreach for education and support for diabetes self management.  Plan:  RN referred to [facility] Pharmacist RN sent additional information on quit smoking RN sent educational material on inhalers RN advised patient to discuss with physician about cough RN will follow up with patient in August for discussion and teach back  [place] Management [phone]

## 2016-05-20 ENCOUNTER — Other Ambulatory Visit: Payer: Self-pay | Admitting: Pharmacist

## 2016-05-20 ENCOUNTER — Telehealth: Payer: Self-pay

## 2016-05-20 NOTE — Patient Outreach (Addendum)
Call patient's PCP's, Dr. Silvio Pate [phone]), office to let provider know about patient using an expired Proventil inhaler and not using her long-acting inhaler, Symbicort, due to cost. Note patient reports not having had these filled in over a year.   Speak with Larene Beach in Dr. Alla German office who reports that Dr. Silvio Pate has not prescribed the inhalers for the patient since April 2015. Reports that Dr. Silvio Pate is not in the office today, but will give him a message tomorrow. Larene Beach reports that the office does not have inhaler samples. Request clarification on whether patient should be taking the maintenance inhalers. Recommend new prescription for albuterol rescue inhaler, as patient reports need for this device. Let provider know that Ventolin is the preferred brand of albuterol through the patient's insurance.  PLAN:  If have not heard back from Dr. Alla German office by 05/22/16, will follow up again at that time.  Harlow Asa, PharmD Clinical Pharmacist [place] Management [phone]

## 2016-05-20 NOTE — Telephone Encounter (Signed)
Elizabeth, Clinical Pharmacist, is trying to figure out if she needs to be on a maintenance inhaler like symbicort and spiriva, as well as an emergency inhaler (She put albuterol on her med list because she is using a very expired proventil inhaler). If you feel like she should, they will work on trying to get them for her through pt assistance. Please advise

## 2016-05-20 NOTE — Patient Outreach (Addendum)
Receive a message from [place] that the patient needs further assistance with her medications, specifically her albuterol, Symbicort and Spiriva. Note that when I spoke with the patient on 05/01/16, she had stated that she no longer needed assistance with affording her medications, but did not mention these inhalers.  Note per [employer]'s online formulary, the Symbicort, Sprivia and Ventolin inhalers are all tier 3 options for the patient. Call [facility] and speak with representative Rashad who reports that the patient's tier 3 copays are $47 each for a 1 month supply at local preferred pharmacist (including [facility], [facility] and [facility]) and $131 each for a 3 month supply through [facility] mail order.  Harlow Asa, PharmD Clinical Pharmacist [place] Management [phone]

## 2016-05-20 NOTE — Patient Outreach (Addendum)
Receive a message from [place] that the patient needs further assistance with the cost of her medications, specifically her albuterol and Symbicort (as well as possibly Spiriva). Note that when I spoke with the patient on 05/01/16, she had stated that she no longer needed assistance with affording her medications, but did not mention these inhalers.  Note per [employer]'s online formulary, the Symbicort, Sprivia and Ventolin inhalers are all tier 3 options for the patient. Per [employer] representative, the patient's tier 3 copays are $47 each for a 1 month supply at local preferred pharmacist (including [facility], [facility] and [facility]) and $131 each for a 3 month supply through [facility] mail order.  Called and spoke with patient. HIPAA identifiers verified and verbal consent received. Patient reports that these copayments are more than she can afford. Patient reports that she currently has a Proventil inhaler that she uses. Have patient check the date on this inhaler and she reports it to have expired, but that she continues to use it. Reports needing it primarily on hot days when she is outside. Counsel patient about the importance of using non-expired medication. Patient denies having a nebulizer. Patient reports that she no longer uses the Symbicort or Spiriva inhalers. Reports that she stopped using the Spiriva because of yeast infections. Counsel patient that this is a side effect that typically occurs with the inhalers that contain inhaled steroids, such as Symbicort. Counsel patient that for this reason, using Symbicort requires that the patient rinse out her mouth after each use. Patient reports that she stopped using the Symbicort inhaler because it was too expensive. Reports that she last tried to have it filled about a year ago and recalls the copay having been about $600 for this and her albuterol inhaler together. Counsel patient about the importance of medication adherence. Regarding  her Spiriva and Symbicort inhalers, patient reports that she feels like "I really don't need them". Patient reports that her COPD is managed by her PCP, Dr. Silvio Pate. Note that per Dr. Alla German most recent visit note with the patient from 01/01/16, these inhalers are not listed on the patient's medication list.  Discuss with patient the Extra Help application for help with her medication costs. Patient denies having completed this application in the past. Also discuss patient assistance applications from medication manufacturers for help with cost of specific medications, such as her inhalers. Patient reports that she does believe that she has spent more that $600 on medications in this calendar year. Ms. Gerl reports that she is not sure how much she and her husband receive in income each month. Patient asks if we can discuss these applications on Friday when she has had a chance to gather this information. Schedule an appointment to work on medication assistance applications with the patient on Friday, 05/22/16 at 9:30 AM over the phone.   PLAN:  1) Will contact patient's PCP, Dr. Silvio Pate [phone]) today to let provider know about patient using an expired rescue inhaler and not using her long-acting inhalers due to cost. Note patient reports not having had these filled in over a year. Will ask whether patient should be taking maintenance inhalers for her COPD and ask about possible samples for her inhalers, as we work on patient assistance.  2) Will follow up with the patient by phone on Friday, 05/22/16 at 9:30 AM to discuss the extra help and medication assistance applications.  Harlow Asa, PharmD Clinical Pharmacist [place] Management [phone]

## 2016-05-21 NOTE — Telephone Encounter (Signed)
Left a detailed message for Molly Peters.

## 2016-05-21 NOTE — Telephone Encounter (Signed)
Her main problem is the continued smoking. She should have an albuterol inhaler but I am not sure she needs steroid inhaler at this point. Could consider tiotropium if she could get it through assistance

## 2016-05-22 ENCOUNTER — Ambulatory Visit: Payer: Self-pay | Admitting: Pharmacist

## 2016-05-22 ENCOUNTER — Other Ambulatory Visit: Payer: Self-pay | Admitting: Pharmacist

## 2016-05-22 ENCOUNTER — Other Ambulatory Visit: Payer: Self-pay

## 2016-05-22 MED ORDER — ALBUTEROL SULFATE HFA 108 (90 BASE) MCG/ACT IN AERS: 1.0000 | INHALATION_SPRAY | Freq: Four times a day (QID) | RESPIRATORY_TRACT | Status: DC | PRN

## 2016-05-22 NOTE — Patient Outreach (Signed)
Receive a message back from [facility] in Dr. Alla German office that per Dr. Silvio Pate, "her main problem is the continued smoking. She should have an albuterol inhaler but I am not sure she needs steroid inhaler at this point. Could consider tiotropium if she could get it through assistance.  Return Shannon's call. Request that Dr. Silvio Pate call in a prescription for the Ventolin inhaler to the patient's local pharmacy. Let Larene Beach know that I will be working with Mrs. Pruett today to start the medication assistance paperwork. Larene Beach asks that we try to obtain patient assistance for both the Ventolin and Spiriva inhalers.  Harlow Asa, PharmD Clinical Pharmacist [place] Management [phone]

## 2016-05-22 NOTE — Telephone Encounter (Signed)
Molly Peters called to get a refill on the albuterol inhaler. They will work on getting her apt assistance for albuterol and Spiriva.

## 2016-05-22 NOTE — Patient Outreach (Signed)
Called Molly Peters for our scheduled telepone appointment to work on extra help and medication assistance applications. Patient does not answer. Left a HIPAA compliant message on the patient's voicemail. If have not heard from patient by 05/25/16, will give her another call at that time.  Harlow Asa, PharmD Clinical Pharmacist [place] Management [phone]

## 2016-05-25 ENCOUNTER — Ambulatory Visit: Payer: Self-pay | Admitting: Pharmacist

## 2016-05-25 ENCOUNTER — Other Ambulatory Visit: Payer: Self-pay | Admitting: Pharmacist

## 2016-05-25 NOTE — Patient Outreach (Signed)
Call to follow up with Mrs. Molly Peters regarding our missed telepone appointment to work on extra help and medication assistance applications. Patient reports that she forgot about our appointment on Friday. Reports that she also forgot to get the financial information for herself and her husband together. Remind patient of the financial information that we will need. Patient states that she can get this together. Reschedule this appointment for Wednesday, 05/27/16 at 10 AM.  Let patient know that her PCP, Dr. Silvio Pate, has called in a prescription for her Ventolin inhaler to her local pharmacy. Remind patient that per [facility], her copay for this inhaler should be $47 at a preferred pharmacy. Patient reports that she believes that she can afford this copayment today and will have the prescription transferred to [place] and filled. Review with patient her inhaler technique.  PLAN:  1) Patient to call [facility] to have the Ventolin inhaler prescription transferred and filled. Patient to pick this up today.  2) Will follow up with patient again on Wednesday, 05/27/16 at 10 AM to work on extra help and medication assistance applications.  Harlow Asa, PharmD Clinical Pharmacist [place] Management [phone]

## 2016-05-27 ENCOUNTER — Ambulatory Visit: Payer: Self-pay | Admitting: Pharmacist

## 2016-05-27 ENCOUNTER — Other Ambulatory Visit: Payer: Self-pay | Admitting: Pharmacist

## 2016-05-27 NOTE — Patient Outreach (Signed)
Called Molly Peters for our second scheduled telepone appointment to work on extra help and medication assistance applications. Patient again does not answer. Left a HIPAA compliant message on the patient's voicemail. If have not heard from patient by 05/29/16, will give her another call at that time.  Harlow Asa, PharmD Clinical Pharmacist [place] Management [phone]

## 2016-05-29 ENCOUNTER — Other Ambulatory Visit: Payer: Self-pay | Admitting: Pharmacist

## 2016-05-29 NOTE — Patient Outreach (Signed)
Receive a call from Mrs. Enoch regarding missing our second scheduled telepone appointment to work on extra help and medication assistance applications. Discuss with Mrs. Echavarria the importance of keeping this appointment in order to receive assistance with her medications. Patient asks to reschedule for Wednesday, 06/03/16 at 11 AM. Patient reports that she has no medication questions for me today. Will follow up with Mrs. Karras at that time.    Harlow Asa, PharmD Clinical Pharmacist [place] Management [phone]

## 2016-06-03 ENCOUNTER — Other Ambulatory Visit: Payer: Self-pay | Admitting: Pharmacist

## 2016-06-03 NOTE — Patient Outreach (Addendum)
11 AM: Call Mrs. Wenzler for our scheduled telepone appointment to work on extra help and medication assistance applications. HIPAA identifiers verified and verbal consent received. Mrs. Rost state states that she has gathered the information that we discussed to complete these applications.   Patient reports that she was able to pick up her albuterol inhaler and that she has this now.  Start with patient on the Extra Help application. However, after starting the application, patient reports that she needs to call her husband for more information required by the application. States that she will call him and then call me back.   2:35 PM: Call Mrs. Fury back and continue the extra help application. Patient gives me permission to reach out to her husband to verify his information on this application and request his permission to submit the application. Patient gives me permission to discuss the need for the application in order to get her assistance with the cost of her medications.  2:45 PM: Call Mr. Eatherly who reports that he is not sure about the number for his estimated wages for the year. Mr. Hamideh asks that I call back on Friday after he has had a chance to verify.  2:55 PM: Let Mrs. Frymyer know that I will call back on Friday. Mrs. Akram asks that I call back at 1pm on 06/05/16.  Harlow Asa, PharmD Clinical Pharmacist [place] [facility] [phone]

## 2016-06-05 ENCOUNTER — Other Ambulatory Visit: Payer: Self-pay | Admitting: Pharmacist

## 2016-06-05 ENCOUNTER — Telehealth: Payer: Self-pay

## 2016-06-05 NOTE — Telephone Encounter (Signed)
Elizabeth clinical pharmacist with [facility] left v/m; pt concerned about cost of meds and Benjamine Mola has some lower tier substitutes for pts meds if Dr Silvio Pate would agree. Paroxetine is Tier 3 option and alternative Tier 1 includes sertraline or fluoxetine capsules. Also for omeprazole which is a Tier 2 the alternative at Tier 1 could be pantoprazole. Elizabeth request cb after Dr Silvio Pate reviews these options.

## 2016-06-05 NOTE — Patient Outreach (Signed)
Call Mrs. Scouten back in order to complete the extra help application. Mrs. Hautala provides me with her husband's projected estimate of his salary for 2017. Based on this number, their annual household income exceeds the limits for both the Ventolin patient assistance program through [place] and the Spiriva patient assistance program through [employer].   Discuss with Mrs. Emberton other methods of cost savings. Remind patient to continue to use [facility] for her maintenance medications for maximum cost savings, especially her losartan/hydrochlorothiazide, which is a tier 1 medication. Offer to contact provider about other medications that are tier 2 or 3 options that have lower tier alternatives. Patient asks that I contact Dr. Silvio Pate about lower tier alternatives to paroxetine (tier 3) and omeprazole (tier 2). Also advise patient to use the [employer] Over the Counter(OTC) Benefit to receive some of her OTC medicines for free and provide her with the phone number.  Also provide patient with the contact information for [facility], a non-profit pharmacy through which the patient can receive an albuterol inhaler for $35 per canister. Patient states that she will look into using this program for when she is out of her current Ventolin inhaler.  Discuss with patient the importance of medication adherence. Discuss the importance of budgeting money for her copayments.   Patient reports that she has no further medication questions or concerns for me at this time. Confirm that patient has my phone number.  PLAN:  1) Will call Mrs. Degregory PCP, Dr. Silvio Pate regarding lower tier alternatives to paroxetine (tier 3) and omeprazole (tier 2). Note that per the [place] website, tier 1 alteranatives for paroxetine include sertraline, citalopram and fluoxetine (capsules) and for omeprazole, pantoprazole is a tier 1 alternative.  2) Will plan to close pharmacy episode following  contact with Dr. Silvio Pate.  Harlow Asa, PharmD Clinical Pharmacist [place] [facility] [phone]

## 2016-06-05 NOTE — Patient Outreach (Addendum)
Call Mrs. Russ PCP, Dr. Silvio Pate regarding lower tier alternatives to paroxetine (tier 3) and omeprazole (tier 2). Note that per the [place] website, tier 1 alteranatives for paroxetine include sertraline and fluoxetine (capsules) and for omeprazole, pantoprazole is a tier 1 alternative.  Leave a voicemail message. Information also sent as a message within this encounter.   Will close pharmacy episode at this time.   Harlow Asa, PharmD Clinical Pharmacist [place] [facility] [phone]

## 2016-06-06 NOTE — Telephone Encounter (Signed)
I am okay with the change to omeprazole--but changing the paroxetine is not appropriate without a visit or discussion with the patient

## 2016-06-08 ENCOUNTER — Other Ambulatory Visit: Payer: Self-pay | Admitting: Pharmacist

## 2016-06-08 ENCOUNTER — Other Ambulatory Visit: Payer: Self-pay

## 2016-06-08 MED ORDER — OMEPRAZOLE 20 MG PO CPDR: 20.0000 mg | DELAYED_RELEASE_CAPSULE | Freq: Every day | ORAL | 3 refills | Status: DC

## 2016-06-08 MED ORDER — PANTOPRAZOLE SODIUM 40 MG PO TBEC: 40.0000 mg | DELAYED_RELEASE_TABLET | Freq: Every day | ORAL | 3 refills | Status: DC

## 2016-06-08 NOTE — Patient Outreach (Signed)
Receive a call back from [facility] in Dr. Alla German office. Per Larene Beach, Dr. Silvio Pate is fine with switching the patient from omeprazole to pantoprazole for cost savings to the patient. Ask that Dr. Silvio Pate discuss the possibility of a change from paroxetine to one of the tier 1 alternatives with the patient at her upcoming office visit. Let Larene Beach know that I have also sent this information to the provider in a letter.  Larene Beach states that the office will call the pantoprazole into the patient's [place].  Harlow Asa, PharmD Clinical Pharmacist [place] [facility] [phone]

## 2016-06-08 NOTE — Telephone Encounter (Signed)
The last message was to change omperazole to pantoprazole. I sent in the wrong medication. I have now sent the pantoprazole and added a note to void the omeprazole

## 2016-06-08 NOTE — Progress Notes (Signed)
I have no problem with changing the PPI now, and expect to change her antidepressant at our visit. Thank you for your help

## 2016-06-08 NOTE — Telephone Encounter (Signed)
Spoke to [place]. She said she agrees. The pt has an upcoming OV and can discuss the change to paroxetine. We need to change the omeprazole and send it to [employer].

## 2016-06-18 ENCOUNTER — Other Ambulatory Visit: Payer: Self-pay | Admitting: *Deleted

## 2016-06-18 NOTE — Patient Outreach (Signed)
[facility]) Care Management  06/18/2016  Molly Peters Dec 24, 1960 [id number]    [place] attempted  Follow up outreach call to patient.  Patient was unavailable. HIPPA compliance voicemail message was left with return callback number.   Plan: RN will call patient again within 14 days.    [place] [facility] [phone]

## 2016-06-19 DIAGNOSIS — M1711 Unilateral primary osteoarthritis, right knee: Secondary | ICD-10-CM | POA: Diagnosis not present

## 2016-06-19 DIAGNOSIS — M2351 Chronic instability of knee, right knee: Secondary | ICD-10-CM | POA: Diagnosis not present

## 2016-06-19 DIAGNOSIS — M19042 Primary osteoarthritis, left hand: Secondary | ICD-10-CM | POA: Diagnosis not present

## 2016-06-19 DIAGNOSIS — M19041 Primary osteoarthritis, right hand: Secondary | ICD-10-CM | POA: Diagnosis not present

## 2016-06-23 ENCOUNTER — Encounter: Payer: Self-pay | Admitting: *Deleted

## 2016-06-23 ENCOUNTER — Other Ambulatory Visit: Payer: Self-pay | Admitting: *Deleted

## 2016-06-23 NOTE — Patient Outreach (Addendum)
[facility]) Care Management  06/23/2016   Molly Peters October 30, 1961 [id number]  Subjective: RN Health Coach telephone call to patient.  Hipaa compliance verified.  Per patient she is in the green zone. She does having a non productive cough. Patient is still currently smoking. Per patient she is doing good as long as she doesn't stay in the heat. Patient is not having any wheezing or tightness in the chest. Patient has agreed to follow up outreach.  Objective:   Current Medications:  Current Outpatient Prescriptions  Medication Sig Dispense Refill  . albuterol (PROVENTIL HFA;VENTOLIN HFA) 108 (90 Base) MCG/ACT inhaler Inhale 1-2 puffs into the lungs every 6 (six) hours as needed for wheezing or shortness of breath. 1 Inhaler 0  . ALPRAZolam (XANAX) 1 MG tablet TAKE 1 TABLET THREE TIMES DAILY AS NEEDED FOR ANXIETY 270 tablet 0  . Cholecalciferol (VITAMIN D3) 2000 units capsule Take 1 capsule (2,000 Units total) by mouth daily. 30 capsule PRN  . gabapentin (NEURONTIN) 300 MG capsule TAKE 1 CAPSULE TWICE DAILY AND TAKE 2 CAPSULES AT BEDTIME. 360 capsule 3  . hydrOXYzine (ATARAX/VISTARIL) 50 MG tablet Take 1 tablet (50 mg total) by mouth 3 (three) times daily as needed for itching. 270 tablet 0  . lamoTRIgine (LAMICTAL) 100 MG tablet Take 1 tablet (100 mg total) by mouth 2 (two) times daily. 180 tablet 3  . losartan-hydrochlorothiazide (HYZAAR) 100-12.5 MG per tablet TAKE 1 TABLET EVERY DAY 90 tablet 3  . pantoprazole (PROTONIX) 40 MG tablet Take 1 tablet (40 mg total) by mouth daily. 90 tablet 3  . PARoxetine (PAXIL) 20 MG tablet TAKE 1 TABLET EVERY MORNING 90 tablet 3  . potassium chloride (K-DUR) 10 MEQ tablet Take 10 mEq by mouth 2 (two) times daily. Reported on 04/20/2016    . Vitamin D, Ergocalciferol, (DRISDOL) 50000 units CAPS capsule Take 1 capsule (50,000 Units total) by mouth 2 (two) times a week. X 6 weeks. 12 capsule 0  . baclofen (LIORESAL) 10 MG tablet Take 1  tablet (10 mg total) by mouth 3 (three) times daily. As needed for muscle spasm (Patient not taking: Reported on 06/23/2016) 270 tablet 0   No current facility-administered medications for this visit.     Functional Status:  In your present state of health, do you have any difficulty performing the following activities: 06/23/2016 04/20/2016  Hearing? N N  Vision? N N  Difficulty concentrating or making decisions? Tempie Donning  Walking or climbing stairs? Y Y  Dressing or bathing? N N  Doing errands, shopping? N N  Preparing Food and eating ? N N  Using the Toilet? N N  In the past six months, have you accidently leaked urine? Y Y  Do you have problems with loss of bowel control? Y Y  Managing your Medications? N N  Managing your Finances? N N  Housekeeping or managing your Housekeeping? Tempie Donning  Some recent data might be hidden    Fall/Depression Screening: PHQ 2/9 Scores 06/23/2016 05/19/2016 04/20/2016 03/05/2016 01/29/2016  PHQ - 2 Score '3 3 3 ' 0 0  PHQ- 9 Score 10 - 10 - -  Exception Documentation - - - - Medical reason   [facility] CM Care Plan Problem One   Flowsheet Row Most Recent Value  Care Plan Problem One  Knowledge deficit in self management of COPD  Role Documenting the Problem One  [employer] for Problem One  Active  THN Long Term Goal (31-90 days)  Patient will continue to not have any admissions for COPD within the next 90 days  THN Long Term Goal Start Date  06/23/16  Interventions for Problem One [place] reminded patient to keep appointments with PCP as per order. RN Health Coach reminded patient the importance of taking medications as per physician order. RN will monitor patient monthly telephonic  THN CM Short Term Goal #1 (0-30 days)  Patient wil be able to verbalize what the zones  and action plan of COPD are within the next 30 days  THN CM Short Term Goal #1 Met Date  06/23/16  Interventions for Short Term Goal #1  RN sent patient EMMI  information on COPD. RN sent patient a zone magnet for refrigerator. RN sent patient eduational material on COPD exacerbation. RN Sent EMMI on COPD what you can do and When to get help. RN will follow up with discussion and rteach back within a month.  THN CM Short Term Goal #3 (0-30 days)  Patient will report receiving educational material on Quit smoking within the next 30 days  THN CM Short Term Goal #3 Met Date  06/23/16  Interventions for Short Tern Goal #3  RN will send patient educational material on smoking cessation Thinking about Quit smolking. RN will follow up with discussion to see what is needed and the patient goal  THN CM Short Term Goal #4 (0-30 days)  patient will be able to verbalize signs and symptoms of cold flu and pneumonia within 30 days  THN CM Short Term Goal #4 Start Date  06/23/16  Interventions for Short Term Goal #4  RN sent patint educational material on cold, flu and pneumonia. Patient reported she will  be getting her flu shot within the next few month.      Assessment:  Patient is still currently smoking Patient will continue to benefit from Health Coach telephonic outreach for education and support for COPD self management.  Plan:   RN sent patient educational material on cold and flu RN sent patient educational material on community acquired pneumonia RN sent patient educational material on flu and pneumonia vaccine RN will follow up with in the month of September   [facility] [place] [facility] [phone]

## 2016-06-30 ENCOUNTER — Ambulatory Visit: Payer: Medicare PPO | Admitting: Internal Medicine

## 2016-06-30 DIAGNOSIS — Z0289 Encounter for other administrative examinations: Secondary | ICD-10-CM

## 2016-07-02 DIAGNOSIS — H43811 Vitreous degeneration, right eye: Secondary | ICD-10-CM | POA: Diagnosis not present

## 2016-07-03 ENCOUNTER — Ambulatory Visit: Payer: Self-pay | Admitting: *Deleted

## 2016-07-14 ENCOUNTER — Ambulatory Visit: Payer: Medicare PPO | Admitting: Internal Medicine

## 2016-07-16 ENCOUNTER — Other Ambulatory Visit: Payer: Self-pay | Admitting: Internal Medicine

## 2016-07-22 ENCOUNTER — Encounter: Payer: Self-pay | Admitting: *Deleted

## 2016-07-22 ENCOUNTER — Other Ambulatory Visit: Payer: Self-pay | Admitting: *Deleted

## 2016-07-22 ENCOUNTER — Ambulatory Visit: Payer: Medicare PPO | Admitting: Internal Medicine

## 2016-07-22 NOTE — Patient Outreach (Signed)
[facility]) Care Management  07/22/2016   KANNON BAUM 1961/01/28 [id number]  Subjective: RN Health Coach telephone call to patient.  Hipaa compliance verified. Per patient she is in the green zone. Patient stated no problems with wheezing or tightness in chest.. Patient does continue to smoke . Patient has received the educational material sent and discussed quit smoking. Patient has no plans to stop at this time. Per patient she is having pain and swelling in her knee. She is elevating knee and using ice and medication.  Patient is not currently in an exercise routine. Patient is having moderate pain with knee swelling and discomfort. Per patient she is taking her medications as per physician order. Per patient she is attending  Patient has agreed to follow up outreach calls    Objective:   Current Medications:  Current Outpatient Prescriptions  Medication Sig Dispense Refill  . albuterol (PROVENTIL HFA;VENTOLIN HFA) 108 (90 Base) MCG/ACT inhaler Inhale 1-2 puffs into the lungs every 6 (six) hours as needed for wheezing or shortness of breath. 1 Inhaler 0  . ALPRAZolam (XANAX) 1 MG tablet TAKE 1 TABLET THREE TIMES DAILY AS NEEDED FOR ANXIETY 270 tablet 0  . Cholecalciferol (VITAMIN D3) 2000 units capsule Take 1 capsule (2,000 Units total) by mouth daily. 30 capsule PRN  . gabapentin (NEURONTIN) 300 MG capsule TAKE 1 CAPSULE TWICE DAILY AND TAKE 2 CAPSULES AT BEDTIME. 360 capsule 0  . hydrOXYzine (ATARAX/VISTARIL) 50 MG tablet Take 1 tablet (50 mg total) by mouth 3 (three) times daily as needed for itching. 270 tablet 0  . lamoTRIgine (LAMICTAL) 100 MG tablet Take 1 tablet (100 mg total) by mouth 2 (two) times daily. 180 tablet 3  . losartan-hydrochlorothiazide (HYZAAR) 100-12.5 MG per tablet TAKE 1 TABLET EVERY DAY 90 tablet 3  . pantoprazole (PROTONIX) 40 MG tablet Take 1 tablet (40 mg total) by mouth daily. 90 tablet 3  . PARoxetine (PAXIL) 20 MG tablet TAKE 1  TABLET EVERY MORNING 90 tablet 3  . potassium chloride (K-DUR) 10 MEQ tablet Take 10 mEq by mouth 2 (two) times daily. Reported on 04/20/2016    . Vitamin D, Ergocalciferol, (DRISDOL) 50000 units CAPS capsule Take 1 capsule (50,000 Units total) by mouth 2 (two) times a week. X 6 weeks. 12 capsule 0  . baclofen (LIORESAL) 10 MG tablet Take 1 tablet (10 mg total) by mouth 3 (three) times daily. As needed for muscle spasm (Patient not taking: Reported on 07/22/2016) 270 tablet 0   No current facility-administered medications for this visit.     Functional Status:  In your present state of health, do you have any difficulty performing the following activities: 07/22/2016 06/23/2016  Hearing? N N  Vision? - N  Difficulty concentrating or making decisions? Tempie Donning  Walking or climbing stairs? Y Y  Dressing or bathing? N N  Doing errands, shopping? N N  Preparing Food and eating ? N N  Using the Toilet? N N  In the past six months, have you accidently leaked urine? Y Y  Do you have problems with loss of bowel control? Y Y  Managing your Medications? N N  Managing your Finances? N N  Housekeeping or managing your Housekeeping? Tempie Donning  Some recent data might be hidden    Fall/Depression Screening: PHQ 2/9 Scores 06/23/2016 05/19/2016 04/20/2016 03/05/2016 01/29/2016  PHQ - 2 Score _0 0 0  PHQ- 9 Score 10 - 10 - -  Exception Documentation - - - -  Medical reason   [facility] CM Care Plan Problem One   Flowsheet Row Most Recent Value  Care Plan Problem One  Knowledge deficit in self management of COPD  Role Documenting the Problem One  [place] for Problem One  Active  THN Long Term Goal (31-90 days)  Patient will continue to not have any admissions for COPD within the next 90 days  Interventions for Problem One Long Term Goal  RN Health Coach reminded patient to keep appointments with PCP as per order. RN Health Coach reminded patient the importance of taking medications as per physician order.  RN will monitor patient monthly telephonic  THN CM Short Term Goal #4 (0-30 days)  patient will be able to verbalize signs and symptoms of cold flu and pneumonia within 30 days  THN CM Short Term Goal #4 Met Date  07/22/16  Interventions for Short Term Goal #4  RN sent patint educational material on cold, flu and pneumonia. Patient reported she will  be getting her flu shot within the next few month.  THN CM Short Term Goal #5 (0-30 days)  Patient will report receiving educational material on chair exercises wihin tthe next 30 days  THN CM Short Term Goal #5 Start Date  07/22/16  Interventions for Short Term Goal #5  RN sent picture chart and list of chair exercises/ RN will follow up discussion on plans of routine exercise program      Assessment: Patient is currently smoking Patient does not have an exercise routine Patient will continue  benefit from [place] telephonic outreach for education and support for COPD self management.  Plan: RN sent patient educational material on chair exercises RN sent educational material on chronic bronchitis RN will follow up within the month of October   Carisha Kantor [place] [facility] [phone]

## 2016-07-27 ENCOUNTER — Other Ambulatory Visit: Payer: Self-pay | Admitting: Pain Medicine

## 2016-07-27 DIAGNOSIS — E559 Vitamin D deficiency, unspecified: Secondary | ICD-10-CM

## 2016-08-05 ENCOUNTER — Ambulatory Visit: Payer: Medicare PPO | Admitting: Internal Medicine

## 2016-08-06 ENCOUNTER — Other Ambulatory Visit: Payer: Self-pay | Admitting: Internal Medicine

## 2016-08-06 NOTE — Telephone Encounter (Addendum)
Last filled 04-01-16 #270. Last OV 01-01-16 Next OV 08-11-16. Rx will need to be signed and faxed to [facility]

## 2016-08-06 NOTE — Telephone Encounter (Signed)
Rx faxed to [employer] from external fax 

## 2016-08-11 ENCOUNTER — Ambulatory Visit (INDEPENDENT_AMBULATORY_CARE_PROVIDER_SITE_OTHER): Payer: Medicare PPO | Admitting: Internal Medicine

## 2016-08-11 ENCOUNTER — Encounter: Payer: Self-pay | Admitting: Internal Medicine

## 2016-08-11 VITALS — BP 116/80 | HR 86 | Temp 98.2°F | Wt 105.0 lb

## 2016-08-11 DIAGNOSIS — J449 Chronic obstructive pulmonary disease, unspecified: Secondary | ICD-10-CM | POA: Diagnosis not present

## 2016-08-11 DIAGNOSIS — M961 Postlaminectomy syndrome, not elsewhere classified: Secondary | ICD-10-CM | POA: Diagnosis not present

## 2016-08-11 DIAGNOSIS — G629 Polyneuropathy, unspecified: Secondary | ICD-10-CM | POA: Diagnosis not present

## 2016-08-11 DIAGNOSIS — Z23 Encounter for immunization: Secondary | ICD-10-CM

## 2016-08-11 DIAGNOSIS — F39 Unspecified mood [affective] disorder: Secondary | ICD-10-CM

## 2016-08-11 NOTE — Addendum Note (Signed)
Addended by: Pilar Grammes on: 08/11/2016 03:03 PM   Modules accepted: Orders

## 2016-08-11 NOTE — Progress Notes (Signed)
Pre visit review using our clinic review tool, if applicable. No additional management support is needed unless otherwise documented below in the visit note. 

## 2016-08-11 NOTE — Assessment & Plan Note (Signed)
Pain related dysthymia Chronic severe anxiety Will continue current meds

## 2016-08-11 NOTE — Progress Notes (Signed)
Subjective:    Patient ID: Molly Peters, female    DOB: 19-Oct-1961, 55 y.o.   MRN: [id number]  HPI Here for follow up of chronic health conditions  Having ongoing pain Right hip bad recently Still gets spasms in right shoulder---if tries to move it (still limited ROM) Dislocating with humeral head going forward or back-- usually will pop back in if she rotates) Does have ortho follow up  Done with the pain doctor Off narcotics still Gets some relief from marijuana--usually just uses in the evenings  Mood is okay Gets some flare ups with children/grandkids Now has great grandchild  Constant cough Still not ready to stop smoking  Current Outpatient Prescriptions on File Prior to Visit  Medication Sig Dispense Refill  . albuterol (PROVENTIL HFA;VENTOLIN HFA) 108 (90 Base) MCG/ACT inhaler Inhale 1-2 puffs into the lungs every 6 (six) hours as needed for wheezing or shortness of breath. 1 Inhaler 0  . ALPRAZolam (XANAX) 1 MG tablet TAKE 1 TABLET THREE TIMES DAILY AS NEEDED FOR ANXIETY 270 tablet 0  . baclofen (LIORESAL) 10 MG tablet Take 1 tablet (10 mg total) by mouth 3 (three) times daily. As needed for muscle spasm 270 tablet 0  . Cholecalciferol (VITAMIN D3) 2000 units capsule Take 1 capsule (2,000 Units total) by mouth daily. 30 capsule PRN  . gabapentin (NEURONTIN) 300 MG capsule TAKE 1 CAPSULE TWICE DAILY AND TAKE 2 CAPSULES AT BEDTIME. 360 capsule 0  . hydrOXYzine (ATARAX/VISTARIL) 50 MG tablet Take 1 tablet (50 mg total) by mouth 3 (three) times daily as needed for itching. 270 tablet 0  . lamoTRIgine (LAMICTAL) 100 MG tablet Take 1 tablet (100 mg total) by mouth 2 (two) times daily. 180 tablet 3  . losartan-hydrochlorothiazide (HYZAAR) 100-12.5 MG per tablet TAKE 1 TABLET EVERY DAY 90 tablet 3  . pantoprazole (PROTONIX) 40 MG tablet Take 1 tablet (40 mg total) by mouth daily. 90 tablet 3  . PARoxetine (PAXIL) 20 MG tablet TAKE 1 TABLET EVERY MORNING 90 tablet 3  .  potassium chloride (K-DUR) 10 MEQ tablet Take 10 mEq by mouth 2 (two) times daily. Reported on 04/20/2016    . Vitamin D, Ergocalciferol, (DRISDOL) 50000 units CAPS capsule Take 1 capsule (50,000 Units total) by mouth 2 (two) times a week. X 6 weeks. 12 capsule 0   No current facility-administered medications on file prior to visit.     Allergies  Allergen Reactions  . Carbamazepine Hives  . Divalproex Sodium Swelling    HAIR FALLS OUT  . Clonazepam Other (See Comments)    HALLUCINATIONS HALLUCINATIONS  . Divalproex Sodium Swelling    HAIR FALLS OUT  . Diphenhydramine Hcl Other (See Comments)    UNKNOWN  . Diphenhydramine Hcl Rash    UNKNOWN  . Tiotropium Other (See Comments)    CREATES YEAST IN THROAT  . Tiotropium Bromide Monohydrate Other (See Comments)    CREATES YEAST IN THROAT  . Vicodin [Hydrocodone-Acetaminophen] Itching    Past Medical History:  Diagnosis Date  . Anxiety   . Chronic back pain 11/12/2009   Qualifier: Diagnosis of  By: Maxie Better FNP, Rosalita Levan   . COPD (chronic obstructive pulmonary disease) (HCC)    states SOB with ADLs; no O2 use; able to speak in complete sentences without SOB(11/15/2014)  . Depression   . Difficulty swallowing pills    s/p cervical fusion  . Family history of adverse reaction to anesthesia    pt's mother and sister have hx. of  post-op N/V  . Fibromyalgia   . GERD (gastroesophageal reflux disease)   . History of gastric ulcer   . Hypertension    states under control with med., has been on med. x 1-2 yr.  . IBS (irritable bowel syndrome)   . Internal hemorrhoid    states has had intermittent bright red bleeding with BM (11/15/2014)  . Limited joint range of motion    neck - s/p cervical fusion  . Localized primary osteoarthritis of right shoulder region 11/23/2014  . Osteoarthritis of left shoulder 07/05/2015  . Osteoarthritis of right shoulder 11/2014  . Seizures ([place])    last seizure 2010  . Short-term memory loss   . TMJ  syndrome   . Valvular heart disease    Initial workup a number of years ago at [facility].  Echo (10/2009) showed EF 60-65%,      normal LV size, moderate aortic insufficiency with a trileaflet aortic valve and normal aortic root size.  Mild      mitral regurgitation.   . Wears dentures    lower    Past Surgical History:  Procedure Laterality Date  . ABDOMINAL HYSTERECTOMY     partial  . ANTERIOR CERVICAL DECOMP/DISCECTOMY FUSION  02/06/2011   C4-5, C5-6, C6-7  . BREAST LUMPECTOMY Bilateral 1989   x 3 - benign  . CARDIAC CATHETERIZATION  1995  . CESAREAN SECTION     x 2  . COLONOSCOPY  09/28/2008  . ESOPHAGOGASTRODUODENOSCOPY  09/28/2008  . HARDWARE REMOVAL  11/17/2006   L5-S1  . LAMINECTOMY WITH POSTERIOR LATERAL ARTHRODESIS LEVEL 3  12/06/2009   with synovial cyst resection L3-4 bilat.  Marland Kitchen LAMINECTOMY WITH POSTERIOR LATERAL ARTHRODESIS LEVEL 3  11/17/2006   L4-5  . NM MYOVIEW LTD     Lexiscan myoview (10/2009): EF 77%, normal wall motion, normal perfusion.   Marland Kitchen SHOULDER ARTHROSCOPY WITH DEBRIDEMENT AND BICEP TENDON REPAIR Right 04/13/2014   Procedure: RIGHT SHOULDER ARTHROSCOPY WITH DEBRIDEMENT EXTENSIVE;  Surgeon: Johnny Bridge, MD;  Location: [place];  Service: Orthopedics;  Laterality: Right;  . TOTAL SHOULDER ARTHROPLASTY Right 11/23/2014   Procedure: TOTAL RIGHT SHOULDER ARTHROPLASTY;  Surgeon: Johnny Bridge, MD;  Location: [place];  Service: Orthopedics;  Laterality: Right;  . TOTAL SHOULDER ARTHROPLASTY Left 07/05/2015   Procedure: LEFT TOTAL SHOULDER REPLACEMENT;  Surgeon: Marchia Bond, MD;  Location: [place];  Service: Orthopedics;  Laterality: Left;    Family History  Problem Relation Age of Onset  . Arthritis Mother   . Cervical cancer Mother   . Anesthesia problems Mother     post-op N/V  . Healthy Father   . Cancer Father     colon cancer  . Migraines Sister   . Cervical cancer Sister   . Anesthesia problems Sister      post-op N/V  . Migraines Brother   . Asthma Daughter   . Coronary artery disease Maternal Grandmother   . Hypertension Maternal Grandmother   . Diabetes Maternal Grandmother   . Coronary artery disease Paternal Grandmother   . Hypertension Paternal Grandmother   . Diabetes Paternal Grandmother     Social History   Social History  . Marital status: Married    Spouse name: N/A  . Number of children: 3  . Years of education: N/A   Occupational History  . Disabled 2003    Social History Main Topics  . Smoking status: Current Every Day Smoker    Packs/day: 1.50  Years: 42.00    Types: Cigarettes  . Smokeless tobacco: Never Used  . Alcohol use No     Comment: occasionally  . Drug use: No  . Sexual activity: Not on file   Other Topics Concern  . Not on file   Social History Narrative       Review of Systems Usually can sleep okay Some nocturia Appetite is fair Weight is stable    Objective:   Physical Exam  Constitutional: She appears well-developed. No distress.  Neck: Normal range of motion. No thyromegaly present.  Cardiovascular: Normal rate and regular rhythm.  Exam reveals no gallop.   ?opening snap Soft diastolic and systolic murmurs in aortic area  Pulmonary/Chest: Effort normal and breath sounds normal. No respiratory distress. She has no wheezes. She has no rales.  Musculoskeletal: She exhibits no edema.  Lymphadenopathy:    She has no cervical adenopathy.  Psychiatric: She has a normal mood and affect. Her behavior is normal.          Assessment & Plan:

## 2016-08-11 NOTE — Assessment & Plan Note (Signed)
Not ready to stop--and hasn't really wanted to cut back Increases with stress and her nerves

## 2016-08-11 NOTE — Assessment & Plan Note (Signed)
Doesn't feel the gabapentin is helping She will trying weaning to see if she still needs it

## 2016-08-11 NOTE — Assessment & Plan Note (Signed)
Chronic pain issues No pain management at this point

## 2016-08-19 ENCOUNTER — Ambulatory Visit: Payer: Self-pay | Admitting: *Deleted

## 2016-09-03 ENCOUNTER — Encounter: Payer: Self-pay | Admitting: *Deleted

## 2016-09-03 ENCOUNTER — Other Ambulatory Visit: Payer: Self-pay | Admitting: *Deleted

## 2016-09-03 DIAGNOSIS — G2581 Restless legs syndrome: Secondary | ICD-10-CM

## 2016-09-03 DIAGNOSIS — J441 Chronic obstructive pulmonary disease with (acute) exacerbation: Secondary | ICD-10-CM

## 2016-09-03 NOTE — Patient Outreach (Signed)
[facility]) Care Management  09/03/2016   FUTURE HAUS Feb 02, 1961 [id number]  Subjective: RN Health Coach telephone call to patient.  Hipaa compliance verified.  Per patient she is doing very good. Patient is in the green zone. Per patient she has no congestion.  Per patient the only problem she has is with restless leg syndrome. Per patient she was on lyrica and that was discontinued. . Per patient the insurance would not cover and she could not afford it. Patient greed to referral to pharmacy to see if they know of some assistance. Patient is currently still smoking and at this time has no desire to stop. Per patient she is taking her medications as per ordered. Patient has agreed to follow up outreach call.    Objective:   Current Medications:  Current Outpatient Prescriptions  Medication Sig Dispense Refill  . albuterol (PROVENTIL HFA;VENTOLIN HFA) 108 (90 Base) MCG/ACT inhaler Inhale 1-2 puffs into the lungs every 6 (six) hours as needed for wheezing or shortness of breath. 1 Inhaler 0  . ALPRAZolam (XANAX) 1 MG tablet TAKE 1 TABLET THREE TIMES DAILY AS NEEDED FOR ANXIETY 270 tablet 0  . baclofen (LIORESAL) 10 MG tablet Take 1 tablet (10 mg total) by mouth 3 (three) times daily. As needed for muscle spasm 270 tablet 0  . Cholecalciferol (VITAMIN D3) 2000 units capsule Take 1 capsule (2,000 Units total) by mouth daily. 30 capsule PRN  . gabapentin (NEURONTIN) 300 MG capsule TAKE 1 CAPSULE TWICE DAILY AND TAKE 2 CAPSULES AT BEDTIME. 360 capsule 0  . hydrOXYzine (ATARAX/VISTARIL) 50 MG tablet Take 1 tablet (50 mg total) by mouth 3 (three) times daily as needed for itching. 270 tablet 0  . lamoTRIgine (LAMICTAL) 100 MG tablet Take 1 tablet (100 mg total) by mouth 2 (two) times daily. 180 tablet 3  . losartan-hydrochlorothiazide (HYZAAR) 100-12.5 MG per tablet TAKE 1 TABLET EVERY DAY 90 tablet 3  . pantoprazole (PROTONIX) 40 MG tablet Take 1 tablet (40 mg total) by  mouth daily. 90 tablet 3  . PARoxetine (PAXIL) 20 MG tablet TAKE 1 TABLET EVERY MORNING 90 tablet 3  . potassium chloride (K-DUR) 10 MEQ tablet Take 10 mEq by mouth 2 (two) times daily. Reported on 04/20/2016    . Vitamin D, Ergocalciferol, (DRISDOL) 50000 units CAPS capsule Take 1 capsule (50,000 Units total) by mouth 2 (two) times a week. X 6 weeks. 12 capsule 0   No current facility-administered medications for this visit.     Functional Status:  In your present state of health, do you have any difficulty performing the following activities: 07/22/2016 06/23/2016  Hearing? N N  Vision? - N  Difficulty concentrating or making decisions? Tempie Donning  Walking or climbing stairs? Y Y  Dressing or bathing? N N  Doing errands, shopping? N N  Preparing Food and eating ? N N  Using the Toilet? N N  In the past six months, have you accidently leaked urine? Y Y  Do you have problems with loss of bowel control? Y Y  Managing your Medications? N N  Managing your Finances? N N  Housekeeping or managing your Housekeeping? Tempie Donning  Some recent data might be hidden    Fall/Depression Screening: PHQ 2/9 Scores 09/03/2016 06/23/2016 05/19/2016 04/20/2016 03/05/2016 01/29/2016  PHQ - 2 Score 3 3 3 3  0 0  PHQ- 9 Score 10 10 - 10 - -  Exception Documentation - - - - - Medical reason   [facility]  CM Care Plan Problem One   Flowsheet Row Most Recent Value  Care Plan Problem One  Knowledge deficit in self management of COPD  Role Documenting the Problem One  [place] for Problem One  Active  THN Long Term Goal (31-90 days)  Patient will continue to not have any admissions for COPD within the next 90 days  THN CM Short Term Goal #1 (0-30 days)  Patient will be able to verbalize understanding copd exacerbation within the next 30 days  THN CM Short Term Goal #1 Start Date  09/03/16  Interventions for Short Term Goal #1  RN will send educational information on COPD exacerbation.RN will follow up with discussion   THN CM Short Term Goal #5 (0-30 days)  Patient will report receiving educational material on chair exercises within the the next 30 days  Interventions for Short Term Goal #5  RN sent picture chart and list of chair exercises/ RN will follow up discussion on plans of routine exercise program     Assessment:  Patient is currently smoking with no desire to quit at this time Patient has restless leg syndrome Patient will continue to benefit from Health Coach telephonic outreach for education and support for COPD self management.  Plan:  RN sent educational material on COPD exacerbation RN sent educational material on restless leg syndrome RN will follow up with the month of December RN referred to [place] [facility] [phone]

## 2016-09-17 ENCOUNTER — Telehealth: Payer: Self-pay | Admitting: Internal Medicine

## 2016-09-17 DIAGNOSIS — N63 Unspecified lump in unspecified breast: Secondary | ICD-10-CM

## 2016-09-17 NOTE — Telephone Encounter (Signed)
Molly Peters, would this be a bilateral diagnostic mammogram due to no mammogram since 07/2013? Would it require an ultrasound, too?

## 2016-09-17 NOTE — Telephone Encounter (Signed)
Please order a diagnostic mammogram for her

## 2016-09-17 NOTE — Telephone Encounter (Signed)
Pt is feeling knots in her breasts.  Pt requesting referral to [facility], she has been there ebefore   cb number is [phone]

## 2016-09-18 NOTE — Telephone Encounter (Signed)
Recommend you call [facility] for guidance of what the order needs to be after speaking to the patient.

## 2016-09-22 NOTE — Telephone Encounter (Signed)
Spoke to pt. She said she has lumps at 6 o'clock in the left breast and 2 lumps on the right at 9 and 1 o'clock. I have placed a future order for a bilateral diagnostic mammogram.

## 2016-09-22 NOTE — Telephone Encounter (Signed)
Call [facility] to ask what you need to [phone]

## 2016-09-23 NOTE — Telephone Encounter (Signed)
Molly Peters Can you put in ultra sound orders img [phone] and img [phone] Thanks Shirlean Mylar

## 2016-09-24 NOTE — Telephone Encounter (Addendum)
I am still waiting to hear from [facility] to find out exactly what I need to order for her. I will just go ahead and order it how Shirlean Mylar said earlier in the message.

## 2016-09-24 NOTE — Telephone Encounter (Signed)
Robin, I have placed the orders for the bilateral diagnostic mammogram with the 2 Ultrasounds.

## 2016-09-24 NOTE — Telephone Encounter (Signed)
[employer] called with pt on the line today to get status of mammogram. I explained we were waiting on orders and we would call her when that was in.

## 2016-09-24 NOTE — Addendum Note (Signed)
Addended by: Pilar Grammes on: 09/24/2016 03:14 PM   Modules accepted: Orders

## 2016-09-25 NOTE — Telephone Encounter (Signed)
Appointment 1/26 pt aware 

## 2016-09-25 NOTE — Telephone Encounter (Signed)
left message asking pt to call office 11/17/rbh

## 2016-10-14 ENCOUNTER — Other Ambulatory Visit: Payer: Medicare PPO

## 2016-10-14 ENCOUNTER — Ambulatory Visit: Payer: Medicare PPO

## 2016-10-20 ENCOUNTER — Other Ambulatory Visit: Payer: Self-pay | Admitting: *Deleted

## 2016-10-20 NOTE — Patient Outreach (Signed)
[facility] [place] [facility]) Care Management  10/20/2016   VICTOR SCHEINER Jul 12, 1961 [id number]  Subjective: RN Health Coach telephone call to patient.  Hipaa compliance verified. Per patient she is having some coughing. Slight productive yellow tinge. Per patient this has not progressed.  Per patient she is still currently smoking. Patient states the nebulizer treatments and the rescue inhaler is helping. Patient is between the green and yellow zone. Patient is not in any respiratory distress. RN discussed with patient about watching for symptoms of COPD exacerbation and  pneumonia. Patient has not started a routine exercise program.  Patient has agreed to follow up outreach call.  Objective:   Current Medications:  Current Outpatient Prescriptions  Medication Sig Dispense Refill  . albuterol (PROVENTIL HFA;VENTOLIN HFA) 108 (90 Base) MCG/ACT inhaler Inhale 1-2 puffs into the lungs every 6 (six) hours as needed for wheezing or shortness of breath. 1 Inhaler 0  . ALPRAZolam (XANAX) 1 MG tablet TAKE 1 TABLET THREE TIMES DAILY AS NEEDED FOR ANXIETY 270 tablet 0  . baclofen (LIORESAL) 10 MG tablet Take 1 tablet (10 mg total) by mouth 3 (three) times daily. As needed for muscle spasm 270 tablet 0  . Cholecalciferol (VITAMIN D3) 2000 units capsule Take 1 capsule (2,000 Units total) by mouth daily. 30 capsule PRN  . gabapentin (NEURONTIN) 300 MG capsule TAKE 1 CAPSULE TWICE DAILY AND TAKE 2 CAPSULES AT BEDTIME. 360 capsule 0  . hydrOXYzine (ATARAX/VISTARIL) 50 MG tablet Take 1 tablet (50 mg total) by mouth 3 (three) times daily as needed for itching. 270 tablet 0  . lamoTRIgine (LAMICTAL) 100 MG tablet Take 1 tablet (100 mg total) by mouth 2 (two) times daily. 180 tablet 3  . losartan-hydrochlorothiazide (HYZAAR) 100-12.5 MG per tablet TAKE 1 TABLET EVERY DAY 90 tablet 3  . pantoprazole (PROTONIX) 40 MG tablet Take 1 tablet (40 mg total) by mouth daily. 90 tablet 3  . PARoxetine (PAXIL) 20 MG  tablet TAKE 1 TABLET EVERY MORNING 90 tablet 3  . potassium chloride (K-DUR) 10 MEQ tablet Take 10 mEq by mouth 2 (two) times daily. Reported on 04/20/2016    . Vitamin D, Ergocalciferol, (DRISDOL) 50000 units CAPS capsule Take 1 capsule (50,000 Units total) by mouth 2 (two) times a week. X 6 weeks. 12 capsule 0   No current facility-administered medications for this visit.     Functional Status:  In your present state of health, do you have any difficulty performing the following activities: 10/20/2016 07/22/2016  Hearing? N N  Vision? N -  Difficulty concentrating or making decisions? Tempie Donning  Walking or climbing stairs? Y Y  Dressing or bathing? N N  Doing errands, shopping? N N  Preparing Food and eating ? - N  Using the Toilet? - N  In the past six months, have you accidently leaked urine? - Y  Do you have problems with loss of bowel control? - Y  Managing your Medications? - N  Managing your Finances? - N  Housekeeping or managing your Housekeeping? - Y  Some recent data might be hidden    Fall/Depression Screening: PHQ 2/9 Scores 10/20/2016 09/03/2016 06/23/2016 05/19/2016 04/20/2016 03/05/2016 01/29/2016  PHQ - 2 Score 3 3 3 3 3  0 0  PHQ- 9 Score 9 10 10  - 10 - -  Exception Documentation - - - - - - Medical reason   [facility] CM Care Plan Problem One   Flowsheet Row Most Recent Value  Care Plan Problem One  Knowledge  deficit in self management of COPD  Role Documenting the Problem One  [place] for Problem One  Active  THN Long Term Goal (31-90 days)  Patient will continue to not have any admissions for COPD within the next 90 days  Interventions for Problem One Long Term Goal  RN Health Coach reminded patient to keep appointments with PCP as per order. RN Health Coach reminded patient the importance of taking medications as per physician order. RN will monitor patient monthly telephonic  THN CM Short Term Goal #1 (0-30 days)  Patient will be able to verbalize  understanding copd exacerbation within the next 30 days  Interventions for Short Term Goal #5  RN sent picture chart and list of chair exercises/ RN will follow up discussion on plans of routine exercise program      Assessment:  Patient is having some coughing Patient is using inhalers and nebulizer Patient is still currently smoking   Plan:  RN reiterated with patient the signs and symptoms of COPD exacerbation and Pneumonia RN discussed with patient about notifying the physician of symptoms worsening RN will follow up outreach with the month of January   Iver Fehrenbach [facility] [phone]

## 2016-10-21 ENCOUNTER — Other Ambulatory Visit: Payer: Self-pay | Admitting: Internal Medicine

## 2016-11-03 ENCOUNTER — Other Ambulatory Visit: Payer: Self-pay | Admitting: Pharmacist

## 2016-11-03 NOTE — Patient Outreach (Signed)
[place] [facility]) Care Management  11/03/2016  Molly Peters Mar 23, 1961 [id number]  55 year old female referred to [place] for medication assistance. Was unable to reach patient via telephone and have left HIPAA compliant message asking her to return my call.    Plan: Will followup medication assistance in 2-3 days  Bennye Alm, PharmD, [place] PGY2 Pharmacy Resident [phone]

## 2016-11-04 ENCOUNTER — Other Ambulatory Visit: Payer: Medicare PPO

## 2016-11-04 ENCOUNTER — Ambulatory Visit: Admission: RE | Admit: 2016-11-04 | Payer: Medicare PPO | Source: Ambulatory Visit

## 2016-11-06 ENCOUNTER — Other Ambulatory Visit: Payer: Self-pay | Admitting: Pharmacist

## 2016-11-06 ENCOUNTER — Ambulatory Visit: Payer: Self-pay | Admitting: Pharmacist

## 2016-11-06 NOTE — Patient Outreach (Signed)
[place] [facility]) Care Management  11/06/2016  Molly Peters Apr 24, 1961 [id number]   55 year old female referred to [place] for medication assistance. Was unable to reach patient via telephone and have left HIPAA compliant message asking her to return my call (unsuccessful outreach attempt #2)  Plan: Will followup medication assistance in next week.   Bennye Alm, PharmD, [place] PGY2 Pharmacy Resident [phone]

## 2016-11-08 IMAGING — CT CT SHOULDER*L* W/O CM
3 series · 16 of 33 positions shown, 19 images · non-contrast
Comparison: None.

CLINICAL DATA: Preop evaluation prior to shoulder replacement.

EXAM:
CT OF THE LEFT SHOULDER WITHOUT CONTRAST
TECHNIQUE: Multidetector CT imaging was performed according to the standard
protocol. Multiplanar CT image reconstructions were also generated.

[Series 4: shoulder 3.0 b41s · axial · 0.45mm/px · z∈[-180,-12]mm · 8 of 68 slices shown, 10 images]
[im 6/68  soft-tissue]
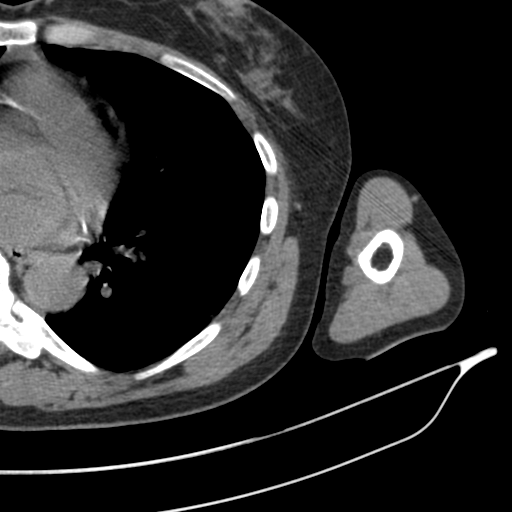
[im 6/68  bone]
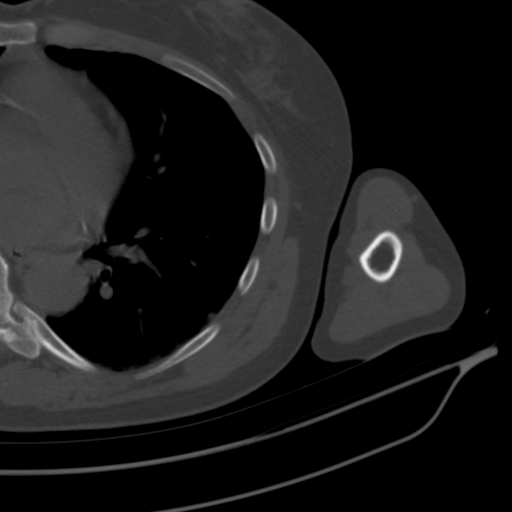
[im 16/68  bone]
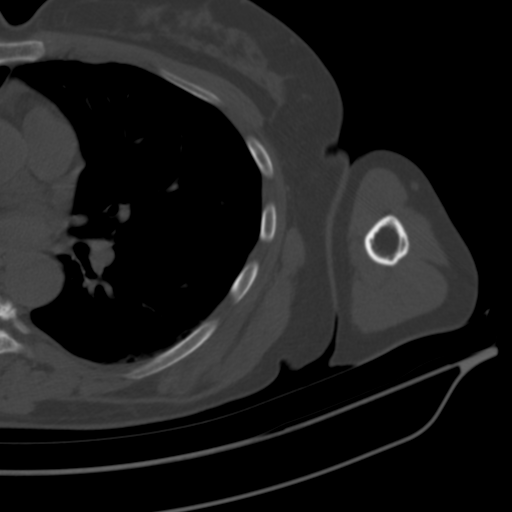
[im 21/68  bone]
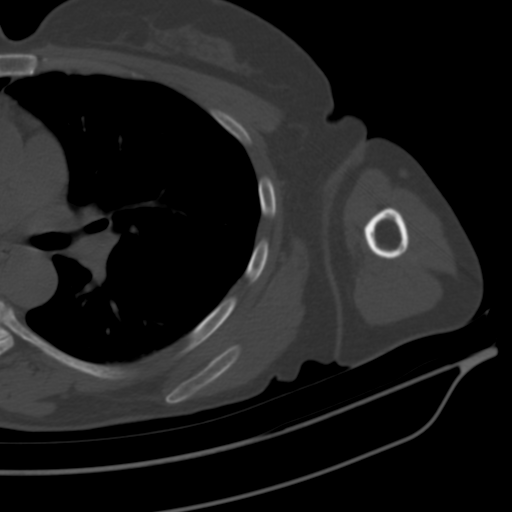
[im 31/68  bone]
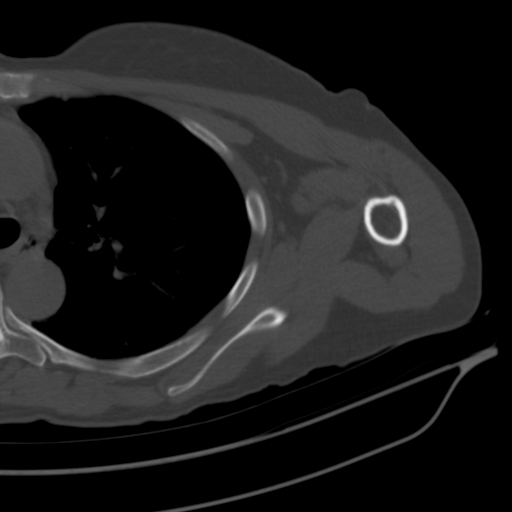
[im 37/68  soft-tissue]
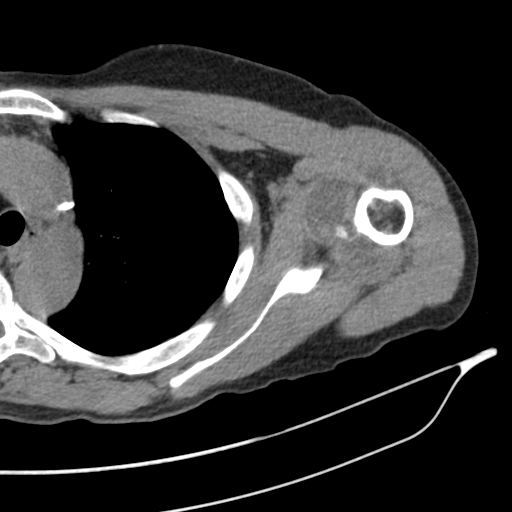
[im 37/68  bone]
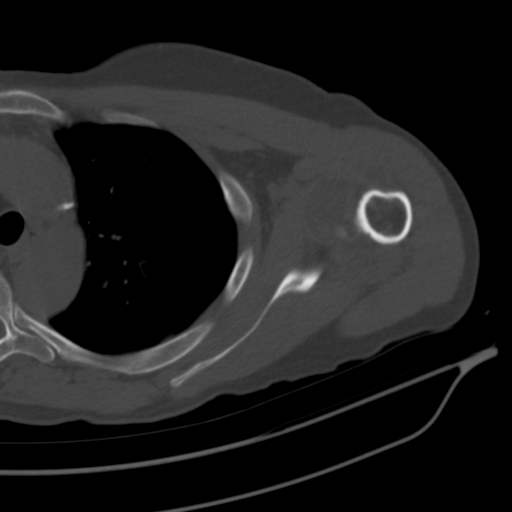
[im 47/68  bone]
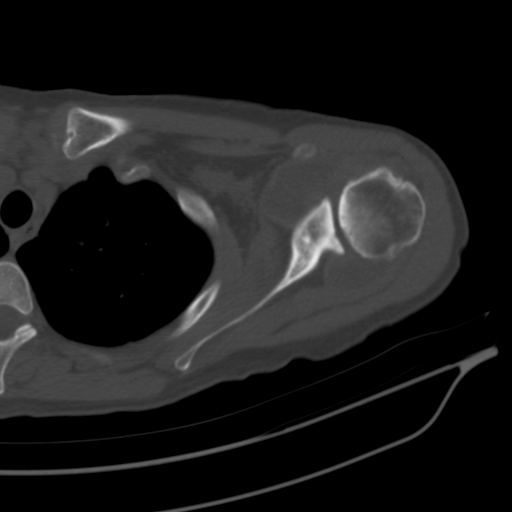
[im 52/68  bone]
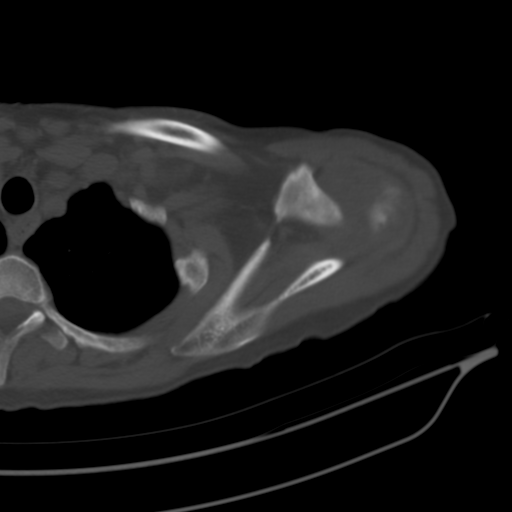
[im 62/68  bone]
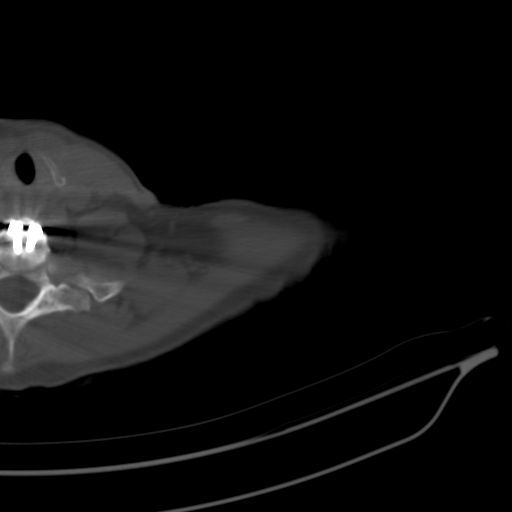

[Series 604: cor obl st · coronal · 0.45mm/px · 3 of 79 slices shown]
[im 16/79  bone]
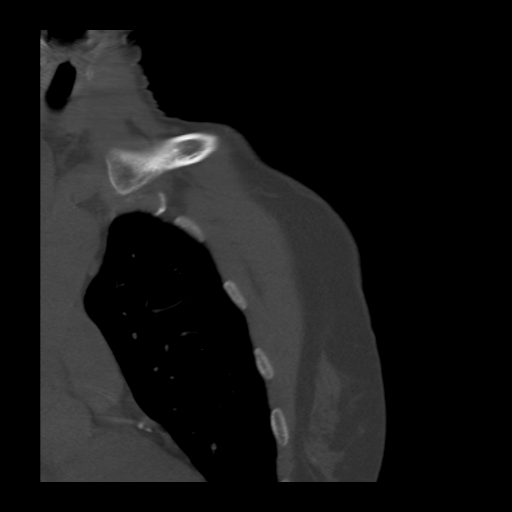
[im 32/79  bone]
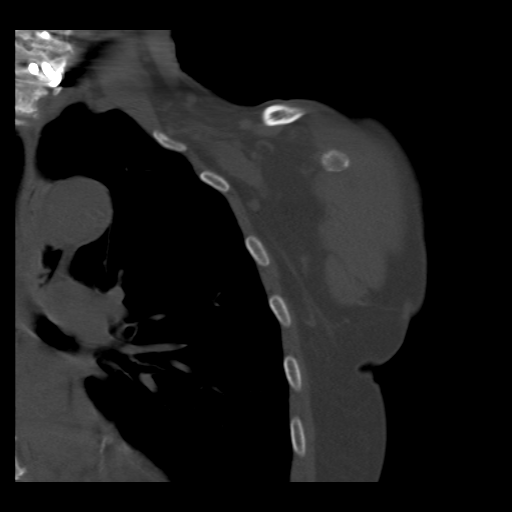
[im 47/79  bone]
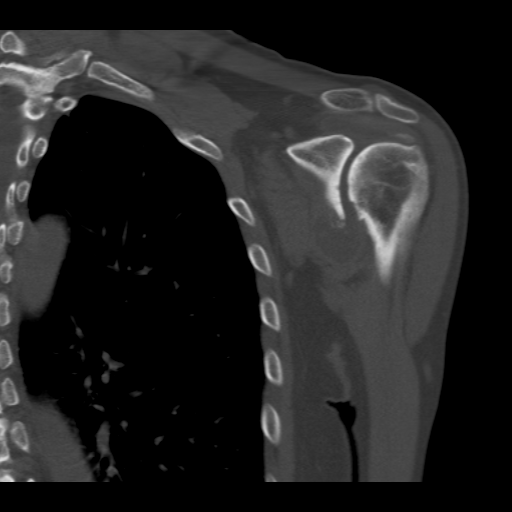

[Series 605: sag obl st · sagittal · 0.45mm/px · 5 of 101 slices shown, 6 images]
[im 34/101  bone]
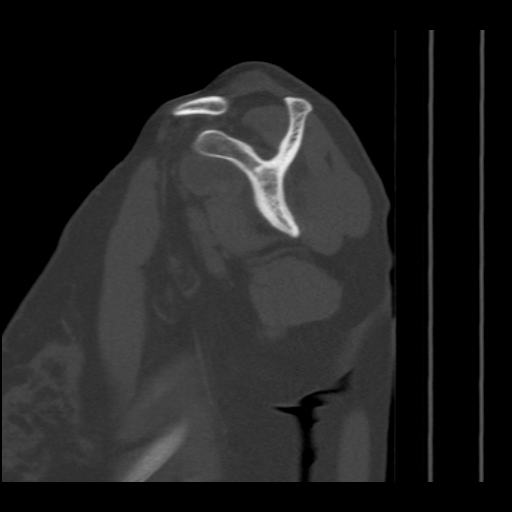
[im 42/101  bone]
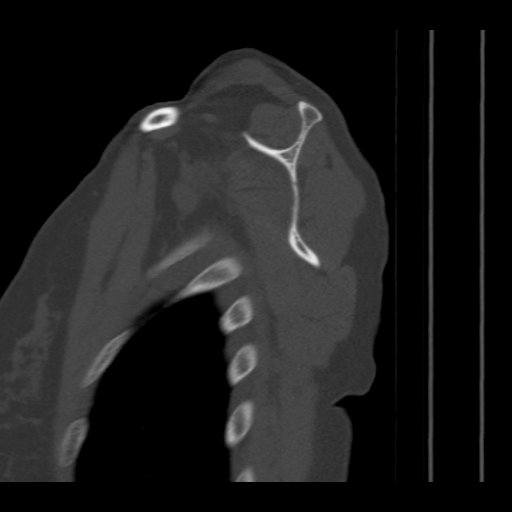
[im 51/101  soft-tissue]
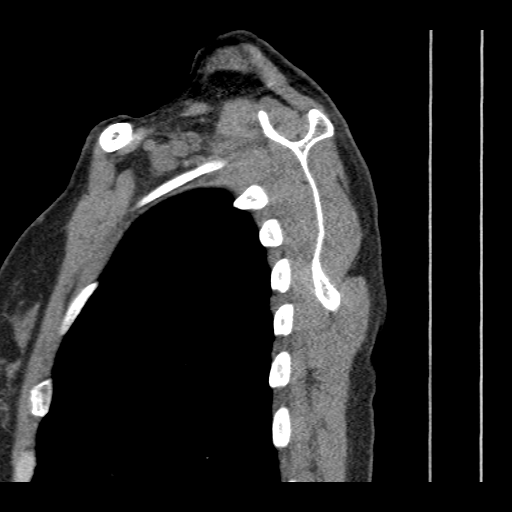
[im 51/101  bone]
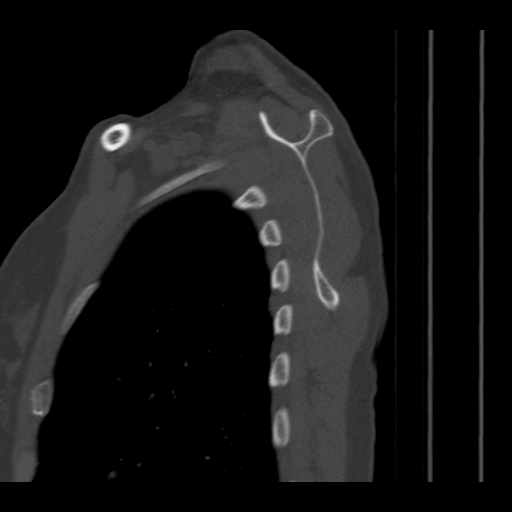
[im 59/101  bone]
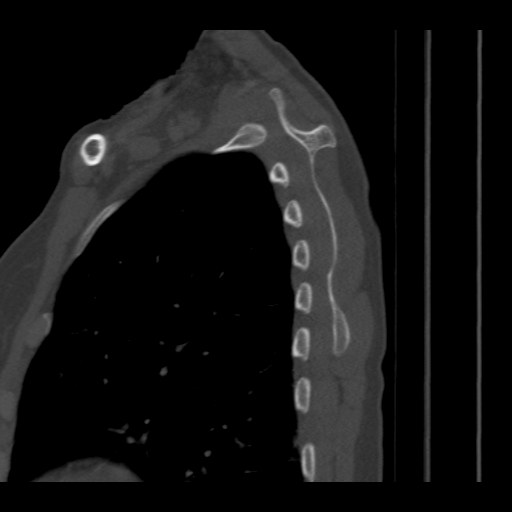
[im 67/101  bone]
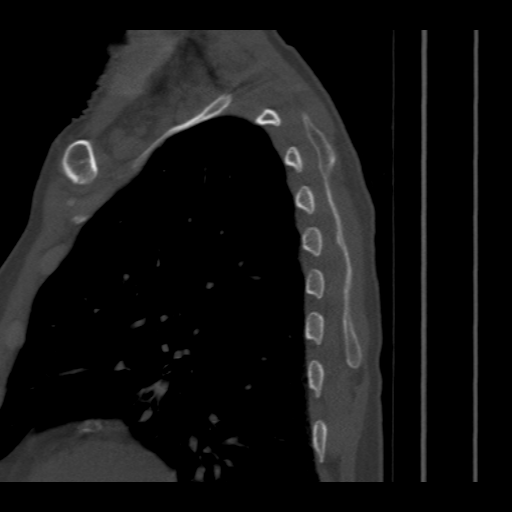

[16 of 33 positions shown; findings below may reference images not displayed]

FINDINGS: No acute fracture or dislocation. No lytic or sclerotic osseous
lesion. Severe osteoarthritis of the left glenohumeral joint with
subchondral sclerosis, cystic changes and marginal osteophytosis.
Large glenohumeral joint effusion. Type II acromion. Mild
degenerative changes of the acromioclavicular joint. Calcification
of the peripheral supraspinatus tendon as can be seen with calcific
tendinosis. The muscles are normal. No axillary lymphadenopathy.

Coronary artery atherosclerosis in the LAD and circumflex.
Centrilobular emphysema of the left lung. There is a 4 mm left upper
lobe subpleural pulmonary nodule.
IMPRESSION: 1. Severe osteoarthritis of the left glenohumeral joint.
2. 4 mm left upper lobe subpleural pulmonary nodule. If the patient
is at high risk for bronchogenic carcinoma, follow-up chest CT at 1
year is recommended. If the patient is at low risk, no follow-up is
needed. This recommendation follows the consensus statement:
Guidelines for Management of Small Pulmonary Nodules Detected on CT
Scans: A Statement from the [HOSPITAL] as published in

## 2016-11-11 ENCOUNTER — Other Ambulatory Visit: Payer: Self-pay | Admitting: Pharmacist

## 2016-11-11 NOTE — Patient Outreach (Signed)
[place] [facility]) Care Management  11/11/2016  CHI CRISE July 28, 1961 [id number]   56 year old female referred to [place] for medication assistance with Lyrica.  Today via telephone patient states that Lyrica has been discontinued and that she does not have difficulty affording any of her medications except for albuterol.  She reports adherence to her medications.  Patient exceeds income requirements for patient assistance from manufacturer for Ventolin.  Patient has been provided information for RxOutreach for a $35 inhaler by [facility] pharmacist in the past.  Patient states she does not need any assistance at this time.   Plan: [place] will sign off as patient does not need medication assistance at this time.  Please reconsult if needed.  Bennye Alm, PharmD, [place] PGY2 Pharmacy Resident [phone]

## 2016-11-16 ENCOUNTER — Other Ambulatory Visit: Payer: Self-pay | Admitting: Internal Medicine

## 2016-11-19 ENCOUNTER — Other Ambulatory Visit: Payer: Self-pay

## 2016-11-19 VITALS — Ht 61.0 in | Wt 105.0 lb

## 2016-11-19 DIAGNOSIS — J441 Chronic obstructive pulmonary disease with (acute) exacerbation: Secondary | ICD-10-CM

## 2016-11-19 NOTE — Patient Outreach (Signed)
[place] [facility]) Care Management  11/19/2016  Molly Peters 02-03-1961 [id number]  Telephonic assessment with patient.  She reports getting through the holidays without any respiratory issues.  She reports ongoing chronic cough and prn use of her albuterol inhaler.  She states she has to pace her activities due to SOB on exertion.  She reports she has not pursued any regular exercise program, but tries to be active in her home.  Plan:  Patient will continue using COPD Action Plan to self-manage and monitor symptoms.           RN will follow up in February.  Candie Mile, RN, MSN [place] [phone] Fax [phone]

## 2016-12-05 ENCOUNTER — Other Ambulatory Visit: Payer: Self-pay | Admitting: Internal Medicine

## 2016-12-14 ENCOUNTER — Other Ambulatory Visit: Payer: Self-pay | Admitting: Internal Medicine

## 2016-12-15 NOTE — Telephone Encounter (Signed)
rx printed and forward to Dr Silvio Pate to sign tomorrow

## 2016-12-15 NOTE — Telephone Encounter (Signed)
Last filled #[id number] 08-10-16 Last OV 08-11-16 Next OV 02-10-17

## 2016-12-15 NOTE — Telephone Encounter (Signed)
Approved: #[id number] x 0  

## 2016-12-21 ENCOUNTER — Other Ambulatory Visit: Payer: Self-pay | Admitting: *Deleted

## 2016-12-21 NOTE — Patient Outreach (Signed)
[facility]) Care Management  12/21/2016  Molly Peters 04-17-1961 [id number]  RN Health Coach attempted   #1Follow up outreach call to patient.  Patient was unavailable. HIPPA compliance voicemail message was left with return callback number Plan: RN will call patient again within 14 days.  [place] [facility] [phone]

## 2016-12-28 ENCOUNTER — Ambulatory Visit: Payer: Self-pay | Admitting: *Deleted

## 2017-01-07 ENCOUNTER — Other Ambulatory Visit: Payer: Self-pay | Admitting: *Deleted

## 2017-01-07 NOTE — Patient Outreach (Signed)
[facility]) Care Management  01/07/2017   Molly Peters Nov 25, 1960 [id number]  RN Health Coach telephone call to patient.  Hipaa compliance verified. Per patient, she is doing fair. Patient still has a hacking cough that she stated she always has. Patient has shortness of breath with exertional activities. Per patient she is still smoking. Patient stated her inhalers are working some. RN health coach explained that if she feels they are not effective she needs to call her physician. Per patient, she has had to cancel her mammogram x 3 due to her Irritable bowel syndrome. Per patient she wants to go to the gastroenterologist but can't afford the co pay. RN asked for name of the gastroenterologist. RN would call to see if co -pay could be set up in a payment. Patient could not remember the Dr. name. Patient stated she goes to the bathroom 4 to 5 times and mostly mucous.  Per patient rarely sees blood. RN suggested patient have PCP refer her to the gastroenterologist   Current Medications:  Current Outpatient Prescriptions  Medication Sig Dispense Refill  . albuterol (PROVENTIL HFA;VENTOLIN HFA) 108 (90 Base) MCG/ACT inhaler Inhale 1-2 puffs into the lungs every 6 (six) hours as needed for wheezing or shortness of breath. 1 Inhaler 0  . ALPRAZolam (XANAX) 1 MG tablet TAKE 1 TABLET THREE TIMES DAILY AS NEEDED FOR ANXIETY 270 tablet 0  . baclofen (LIORESAL) 10 MG tablet Take 1 tablet (10 mg total) by mouth 3 (three) times daily. As needed for muscle spasm 270 tablet 0  . Cholecalciferol (VITAMIN D3) 2000 units capsule Take 1 capsule (2,000 Units total) by mouth daily. 30 capsule PRN  . gabapentin (NEURONTIN) 300 MG capsule TAKE 1 CAPSULE TWICE DAILY AND TAKE 2 CAPSULES AT BEDTIME. 360 capsule 1  . hydrOXYzine (ATARAX/VISTARIL) 50 MG tablet Take 1 tablet (50 mg total) by mouth 3 (three) times daily as needed for itching. 270 tablet 0  . lamoTRIgine (LAMICTAL) 100 MG tablet TAKE 1  TABLET (100 MG TOTAL) BY MOUTH 2 (TWO) TIMES DAILY. 180 tablet 1  . losartan-hydrochlorothiazide (HYZAAR) 100-12.5 MG tablet TAKE 1 TABLET EVERY DAY 90 tablet 0  . pantoprazole (PROTONIX) 40 MG tablet Take 1 tablet (40 mg total) by mouth daily. 90 tablet 3  . PARoxetine (PAXIL) 20 MG tablet TAKE 1 TABLET EVERY MORNING 90 tablet 3  . potassium chloride (K-DUR) 10 MEQ tablet Take 10 mEq by mouth 2 (two) times daily. Reported on 04/20/2016    . Vitamin D, Ergocalciferol, (DRISDOL) 50000 units CAPS capsule Take 1 capsule (50,000 Units total) by mouth 2 (two) times a week. X 6 weeks. 12 capsule 0   No current facility-administered medications for this visit.     Functional Status:  In your present state of health, do you have any difficulty performing the following activities: 01/07/2017 10/20/2016  Hearing? N N  Vision? N N  Difficulty concentrating or making decisions? Tempie Donning  Walking or climbing stairs? Y Y  Dressing or bathing? N N  Doing errands, shopping? N N  Preparing Food and eating ? N -  Using the Toilet? N -  In the past six months, have you accidently leaked urine? Y -  Do you have problems with loss of bowel control? Y -  Managing your Medications? N -  Managing your Finances? N -  Housekeeping or managing your Housekeeping? - -  Some recent data might be hidden    Fall/Depression Screening: PHQ 2/9 Scores 01/07/2017  10/20/2016 09/03/2016 06/23/2016 05/19/2016 04/20/2016 03/05/2016  PHQ - 2 Score 3 3 3 3 3 3  0  PHQ- 9 Score 10 9 10 10  - 10 -  Exception Documentation - - - - - - -   THN CM Care Plan Problem One   Flowsheet Row Most Recent Value  Care Plan Problem One  Knowledge deficit in self management of COPD  Role Documenting the Problem One  [place] for Problem One  Active  THN Long Term Goal (31-90 days)  Patient will continue to not have any admissions for COPD within the next 90 days  THN Long Term Goal Start Date  01/07/17  Interventions for Problem One  [place] reminded patient to keep appointments with PCP as per order. RN Health Coach reminded patient the importance of taking medications as per physician order. RN will monitor patient monthly telephonic  THN CM Short Term Goal #5 (0-30 days)  Patient will report doing chair exercises within the the next 30 days  THN CM Short Term Goal #5 Start Date  01/07/17      Assessment:  Patient is not exercising Patient is currently smoking Patient is maintaining overall health Plan: Patient will discuss with PCP about symptoms and a gastroenterologist referral Patient will try to reschedule mammogram  Patient will make physician aware of hacking cough RN Health Coach will follow up outreach in the month of April  Patient will look for gastroenterologist name to see if a payment plan can be set for [place] Management [phone]

## 2017-01-19 ENCOUNTER — Other Ambulatory Visit: Payer: Self-pay | Admitting: Internal Medicine

## 2017-02-10 ENCOUNTER — Encounter: Payer: Medicare HMO | Admitting: Internal Medicine

## 2017-02-11 ENCOUNTER — Ambulatory Visit: Payer: Self-pay | Admitting: *Deleted

## 2017-02-15 ENCOUNTER — Other Ambulatory Visit: Payer: Self-pay | Admitting: *Deleted

## 2017-02-15 NOTE — Patient Outreach (Signed)
[facility]) Care Management  02/15/2017  Molly Peters 29-Mar-1961 [id number]  [place] attempted #1 Follow up outreach call to patient.  Patient was unavailable. HIPPA compliance voicemail message was left with return callback number.   Plan: RN will call patient again within 14 days.  [place] Care Management [phone]

## 2017-02-16 ENCOUNTER — Other Ambulatory Visit: Payer: Self-pay | Admitting: Internal Medicine

## 2017-02-17 ENCOUNTER — Other Ambulatory Visit: Payer: Self-pay | Admitting: *Deleted

## 2017-02-17 ENCOUNTER — Encounter: Payer: Self-pay | Admitting: *Deleted

## 2017-02-17 ENCOUNTER — Ambulatory Visit: Payer: Self-pay | Admitting: *Deleted

## 2017-02-17 NOTE — Patient Outreach (Signed)
[facility]) Care Management  02/17/2017   TRIVA HUEBER February 01, 1961 [id number]   RN Health Coach telephone call to patient.  Hipaa compliance verified. Per patient she has the hacking cough. She is still currently smoking. Per patient she is using her inhalers as directed.  Per patient she had a real bad night and has only slept about 2 hours. Patient stated her legs have been so restless all night and now they are aching. RN asked patient if she had taken the baclofen and patient stated no. Per patient she forgot about the baclofen for muscle spasms. RN read the prescription order and explained that she could take it three times a day. The patient stated she would take a dose when she got off the phone. Per patient she has been under a lot of stress with her mother and aunts being real sick and she is having to take them to the doctors. Per patient she has not had her mammogram done. She stated she has postponed it. Patient has not started an exercise routine. Patient needs encouraging.   Patient has agreed to follow up outreach calls.    Current Medications:  Current Outpatient Prescriptions  Medication Sig Dispense Refill  . albuterol (PROVENTIL HFA;VENTOLIN HFA) 108 (90 Base) MCG/ACT inhaler Inhale 1-2 puffs into the lungs every 6 (six) hours as needed for wheezing or shortness of breath. 1 Inhaler 0  . ALPRAZolam (XANAX) 1 MG tablet TAKE 1 TABLET THREE TIMES DAILY AS NEEDED FOR ANXIETY 270 tablet 0  . baclofen (LIORESAL) 10 MG tablet Take 1 tablet (10 mg total) by mouth 3 (three) times daily. As needed for muscle spasm 270 tablet 0  . Cholecalciferol (VITAMIN D3) 2000 units capsule Take 1 capsule (2,000 Units total) by mouth daily. 30 capsule PRN  . gabapentin (NEURONTIN) 300 MG capsule TAKE 1 CAPSULE TWICE DAILY AND TAKE 2 CAPSULES AT BEDTIME. 360 capsule 1  . hydrOXYzine (ATARAX/VISTARIL) 50 MG tablet Take 1 tablet (50 mg total) by mouth 3 (three) times daily as  needed for itching. 270 tablet 0  . lamoTRIgine (LAMICTAL) 100 MG tablet TAKE 1 TABLET (100 MG TOTAL) BY MOUTH 2 (TWO) TIMES DAILY. 180 tablet 1  . losartan-hydrochlorothiazide (HYZAAR) 100-12.5 MG tablet TAKE 1 TABLET EVERY DAY 90 tablet 0  . pantoprazole (PROTONIX) 40 MG tablet Take 1 tablet (40 mg total) by mouth daily. 90 tablet 3  . PARoxetine (PAXIL) 20 MG tablet TAKE 1 TABLET EVERY MORNING 90 tablet 0  . potassium chloride (K-DUR) 10 MEQ tablet Take 10 mEq by mouth 2 (two) times daily. Reported on 04/20/2016    . Vitamin D, Ergocalciferol, (DRISDOL) 50000 units CAPS capsule Take 1 capsule (50,000 Units total) by mouth 2 (two) times a week. X 6 weeks. 12 capsule 0   No current facility-administered medications for this visit.     Functional Status:  In your present state of health, do you have any difficulty performing the following activities: 02/17/2017 01/07/2017  Hearing? N N  Vision? N N  Difficulty concentrating or making decisions? Tempie Donning  Walking or climbing stairs? Y Y  Dressing or bathing? N N  Doing errands, shopping? N N  Preparing Food and eating ? N N  Using the Toilet? N N  In the past six months, have you accidently leaked urine? Y Y  Do you have problems with loss of bowel control? Y Y  Managing your Medications? N N  Managing your Finances? N N  Housekeeping  or managing your Housekeeping? Y -  Some recent data might be hidden    Fall/Depression Screening: PHQ 2/9 Scores 02/17/2017 01/07/2017 10/20/2016 09/03/2016 06/23/2016 05/19/2016 04/20/2016  PHQ - 2 Score 3 3 3 3 3 3 3   PHQ- 9 Score 10 10 9 10 10  - 10  Exception Documentation - - - - - - -   THN CM Care Plan Problem One     Most Recent Value  Care Plan Problem One  Knowledge deficit in self management of COPD  Role Documenting the Problem One  [place] for Problem One  Active  THN Long Term Goal (31-90 days)  Patient will continue to not have any admissions for COPD within the next 90 days  THN  Long Term Goal Start Date  02/17/17  Interventions for Problem One [place] reminded patient to keep appointments with PCP as per order. RN Health Coach reminded patient the importance of taking medications as per physician order. RN will monitor patient monthly telephonic  THN CM Short Term Goal #5 (0-30 days)  Patient will report doing chair exercises within the the next 30 days  THN CM Short Term Goal #5 Start Date  02/17/17  Interventions for Short Term Goal #5  RN sent picture chart and list of chair exercises/ RN will follow up discussion on plans of routine exercise program       Assessment:  Patient has not rescheduled her mammogram Patient has not followed up with physician about cough Patient has not planned an exercise routine Patient is having leg pain from restless leg syndrome  Plan:  RN sent patient educational material on restless leg syndrome RN discussed taking medications prescribed RN discussed maintaining overall health RN will follow up within the month of May  [place] Management [phone]

## 2017-03-26 ENCOUNTER — Other Ambulatory Visit: Payer: Self-pay | Admitting: Internal Medicine

## 2017-03-29 ENCOUNTER — Ambulatory Visit: Payer: Self-pay | Admitting: *Deleted

## 2017-03-29 NOTE — Telephone Encounter (Signed)
Last filled 12-17-16 #270 Last OV 08-11-16 No Show recently.   Forward to Dr Glori Bickers in Dr Alla German absence

## 2017-03-29 NOTE — Telephone Encounter (Signed)
Pt wanted to verify alprazolam rx had been sent to mo pharmacy; advised pt was faxed today at 2 pm. Pt voiced understanding and nothing further needed.

## 2017-03-29 NOTE — Telephone Encounter (Signed)
Rx faxed to mail order 

## 2017-03-29 NOTE — Telephone Encounter (Signed)
Printed to fax in absence of PCP

## 2017-03-29 NOTE — Telephone Encounter (Signed)
It will need to be printed and faxed to [facility]

## 2017-04-06 ENCOUNTER — Other Ambulatory Visit: Payer: Self-pay | Admitting: *Deleted

## 2017-04-06 ENCOUNTER — Other Ambulatory Visit: Payer: Self-pay | Admitting: Internal Medicine

## 2017-04-07 NOTE — Patient Outreach (Addendum)
[facility]) Care Management  04/06/2017  DANISSA RUNDLE 1961/06/23 [id number]  RN Health Coach telephone call to patient.  Hipaa compliance verified. Per patient she still has a cough. Patient is still currently smoking. Patient stated the sputum is clear and not bloody or discolored. Patient has not followed up with the Physician about cough.  Per patient she has not went for her follow up mammogram because her mother has been sick. Patient understands that some abnormal area was noted on her pulmonary xray in the past. Patient has never followed up. Per patient she is taking medications as prescribed. Patient stated she still has restless legs and is not sleeping good at night. Patient has agreed to follow up outreach calls.  Current Medications:  Current Outpatient Prescriptions  Medication Sig Dispense Refill  . albuterol (PROVENTIL HFA;VENTOLIN HFA) 108 (90 Base) MCG/ACT inhaler Inhale 1-2 puffs into the lungs every 6 (six) hours as needed for wheezing or shortness of breath. 1 Inhaler 0  . ALPRAZolam (XANAX) 1 MG tablet TAKE 1 TABLET THREE TIMES DAILY AS NEEDED FOR ANXIETY 270 tablet 0  . baclofen (LIORESAL) 10 MG tablet Take 1 tablet (10 mg total) by mouth 3 (three) times daily. As needed for muscle spasm 270 tablet 0  . Cholecalciferol (VITAMIN D3) 2000 units capsule Take 1 capsule (2,000 Units total) by mouth daily. 30 capsule PRN  . gabapentin (NEURONTIN) 300 MG capsule TAKE 1 CAPSULE TWICE DAILY AND TAKE 2 CAPSULES AT BEDTIME. 360 capsule 1  . hydrOXYzine (ATARAX/VISTARIL) 50 MG tablet Take 1 tablet (50 mg total) by mouth 3 (three) times daily as needed for itching. 270 tablet 0  . lamoTRIgine (LAMICTAL) 100 MG tablet TAKE 1 TABLET (100 MG TOTAL) BY MOUTH 2 (TWO) TIMES DAILY. 180 tablet 1  . losartan-hydrochlorothiazide (HYZAAR) 100-12.5 MG tablet TAKE 1 TABLET EVERY DAY 90 tablet 0  . pantoprazole (PROTONIX) 40 MG tablet Take 1 tablet (40 mg total) by mouth  daily. 90 tablet 3  . PARoxetine (PAXIL) 20 MG tablet TAKE 1 TABLET EVERY MORNING 90 tablet 0  . potassium chloride (K-DUR) 10 MEQ tablet Take 10 mEq by mouth 2 (two) times daily. Reported on 04/20/2016    . Vitamin D, Ergocalciferol, (DRISDOL) 50000 units CAPS capsule Take 1 capsule (50,000 Units total) by mouth 2 (two) times a week. X 6 weeks. 12 capsule 0   No current facility-administered medications for this visit.     Functional Status:  In your present state of health, do you have any difficulty performing the following activities: 04/06/2017 02/17/2017  Hearing? N N  Vision? N N  Difficulty concentrating or making decisions? Tempie Donning  Walking or climbing stairs? Y Y  Dressing or bathing? N N  Doing errands, shopping? N N  Preparing Food and eating ? N N  Using the Toilet? N N  In the past six months, have you accidently leaked urine? Y Y  Do you have problems with loss of bowel control? Y Y  Managing your Medications? N N  Managing your Finances? N N  Housekeeping or managing your Housekeeping? Y Y  Some recent data might be hidden    Fall/Depression Screening: Fall Risk  04/06/2017 02/17/2017 01/07/2017  Falls in the past year? No No No  Number falls in past yr: - - -  Injury with Fall? - - -  Risk Factor Category  - - -  Risk for fall due to : Impaired balance/gait Impaired balance/gait Impaired balance/gait  Follow up - - -   PHQ 2/9 Scores 04/06/2017 02/17/2017 01/07/2017 10/20/2016 09/03/2016 06/23/2016 05/19/2016  PHQ - 2 Score 2 3 3 3 3 3 3   PHQ- 9 Score 9 10 10 9 10 10  -  Exception Documentation - - - - - - -   THN CM Care Plan Problem One     Most Recent Value  Care Plan Problem One  Knowledge deficit in self management of COPD  Role Documenting the Problem One  [place] for Problem One  Active  THN Long Term Goal   Patient will continue to not have any admissions for COPD within the next 90 days  THN Long Term Goal Start Date  04/06/17  Interventions for  Problem One [place] reminded patient to keep appointments with PCP as per order. RN Health Coach reminded patient the importance of taking medications as per physician order. RN will monitor patient monthly telephonic  THN CM Short Term Goal #1   patient will be able to verbalize follow up health maintenance within the next 30 days  THN CM Short Term Goal #1 Start Date  04/06/17  Interventions for Short Term Goal #1  RN will sent educational material on follow up health maintenance. RN encouraged patient to follow up with mammogram  THN CM Short Term Goal #5   Patient will report doing chair exercises within the the next 30 days  THN CM Short Term Goal #5 Start Date  04/06/17  Interventions for Short Term Goal #5  RN sent picture chart and list of chair exercises/ RN will follow up discussion on plans of routine exercise program       Assessment:  Patient has not followed up with health maintenance checks Patient still has continuous hacking cough Patient will benefit from [place] telephonic outreach for education and support for COPD self management.  Plan:  RN encouraging patient to follow up with mammogram RN encouraging patient to follow up physician about cough RN discussed medication adherence RN sent patient educational material on female Health Maintenance RN will follow up within the month of June  [place] Management [phone]

## 2017-04-08 ENCOUNTER — Encounter: Payer: Self-pay | Admitting: *Deleted

## 2017-04-14 ENCOUNTER — Other Ambulatory Visit: Payer: Self-pay | Admitting: Primary Care

## 2017-04-14 ENCOUNTER — Other Ambulatory Visit: Payer: Self-pay | Admitting: Internal Medicine

## 2017-04-27 ENCOUNTER — Other Ambulatory Visit: Payer: Self-pay | Admitting: Pain Medicine

## 2017-04-27 ENCOUNTER — Other Ambulatory Visit: Payer: Self-pay | Admitting: Internal Medicine

## 2017-04-27 DIAGNOSIS — E559 Vitamin D deficiency, unspecified: Secondary | ICD-10-CM

## 2017-04-27 NOTE — Telephone Encounter (Signed)
Approved: #180 x 3

## 2017-04-27 NOTE — Telephone Encounter (Signed)
Last f/u 03/2016. Last Rx 11/2016 #90 1R

## 2017-05-06 ENCOUNTER — Ambulatory Visit: Payer: Self-pay | Admitting: *Deleted

## 2017-05-17 ENCOUNTER — Other Ambulatory Visit: Payer: Self-pay | Admitting: *Deleted

## 2017-05-17 NOTE — Patient Outreach (Signed)
[place]) Care Management  05/17/2017  Molly Peters 1961-07-04 [id number]   [place] attempted follow up outreach call to patient.  Patient was unavailable. HIPPA compliance voicemail message left with return callback number.  Plan: RN will call patient again within 14 days.  [place] Care Management [phone]

## 2017-05-26 ENCOUNTER — Other Ambulatory Visit: Payer: Self-pay | Admitting: *Deleted

## 2017-05-26 NOTE — Patient Outreach (Signed)
[place] [facility]) Care Management  05/26/2017  Molly Peters 1960/12/15 [id number]   [place] attempted follow up outreach call to patient.  Patient was unavailable. HIPPA compliance voicemail message left with return callback number.  Plan: RN will call patient again within 14 days.  [place] Care Management [phone]

## 2017-06-09 ENCOUNTER — Encounter: Payer: Self-pay | Admitting: *Deleted

## 2017-06-09 ENCOUNTER — Other Ambulatory Visit: Payer: Self-pay | Admitting: *Deleted

## 2017-06-09 NOTE — Patient Outreach (Signed)
[facility]) Care Management  06/09/2017  DENEE BOEDER 1961-02-02 [id number]   [place] [address]  attempted follow up outreach call to patient.  Patient was unavailable. HIPPA compliance voicemail message left with return callback number.  Plan: RN will send unsuccessful outreach letter to patient and closure letter to patient and Dr within 10 business days.  [place] Care Management [phone]

## 2017-06-14 ENCOUNTER — Other Ambulatory Visit: Payer: Self-pay | Admitting: Internal Medicine

## 2017-06-14 NOTE — Telephone Encounter (Signed)
Last filled 04-01-17 #270 Last OV 08-11-16 No Show 02-10-17 No Future OV

## 2017-06-14 NOTE — Telephone Encounter (Signed)
It is too soon--- almost 3 weeks early. She also needs to set up another appt

## 2017-06-15 ENCOUNTER — Telehealth: Payer: Self-pay | Admitting: Internal Medicine

## 2017-06-15 NOTE — Telephone Encounter (Signed)
Caller Name:Bobie Lordi   Relationship to [place]:  Reason for call:  Pt received a call from [employer] about colon screening. She went to GI several years ago in [place].  She is having diarrhea and mucus in her stool.  She states this has been going on for years.  Does pt need an appt, or a stool kit?

## 2017-06-15 NOTE — Telephone Encounter (Signed)
Spoke to pt. She said she will tell the insurance.

## 2017-06-15 NOTE — Telephone Encounter (Signed)
She is due next year for a colonoscopy Hers was normal in 11/09

## 2017-06-21 NOTE — Telephone Encounter (Signed)
Patient left a voicemail stating that her insurance [employer] has requested a refill on  Xanax and they have not heard back from the office. Patient stated that she gets this from her mail order pharmacy. Last refill 03/29/17 #270 Last office visit 08/11/17

## 2017-06-21 NOTE — Telephone Encounter (Signed)
Okay to send now Mail away pharmacies will order as early as a month early. Let her know that now she should not need it again for a full 3 months She needs to schedule appt also

## 2017-06-22 MED ORDER — ALPRAZOLAM 1 MG PO TABS: ORAL_TABLET | ORAL | 0 refills | Status: DC

## 2017-06-22 NOTE — Addendum Note (Signed)
Addended by: Emelia Salisbury C on: 06/22/2017 11:18 AM   Modules accepted: Orders

## 2017-06-22 NOTE — Telephone Encounter (Addendum)
Rx needs to be printed and signed. See allergy/contraindication

## 2017-06-22 NOTE — Addendum Note (Signed)
Addended by: Viviana Simpler I on: 06/22/2017 01:15 PM   Modules accepted: Orders

## 2017-06-22 NOTE — Telephone Encounter (Signed)
Prescription faxed to pharmacy as instructed. Patient notified as instructed by telephone and verbalized understanding. Patient stated that she will call back and schedule an appointment as instructed.

## 2017-06-23 ENCOUNTER — Other Ambulatory Visit: Payer: Self-pay | Admitting: *Deleted

## 2017-06-23 NOTE — Patient Outreach (Signed)
[facility] [facility]) Care Management  06/23/2017  Molly Peters Apr 05, 1961 [id number]  Patient has not responded to calls or letter. Will proceed with case closure. Will notify management assistance of case status.  [place] Care Management [phone]

## 2017-09-07 ENCOUNTER — Other Ambulatory Visit: Payer: Self-pay | Admitting: Internal Medicine

## 2017-09-08 NOTE — Telephone Encounter (Signed)
Rx sent electronically.  

## 2017-09-08 NOTE — Telephone Encounter (Signed)
Last refill 04/15/17 #90  Last OV 08/11/16 Ok to refill?

## 2017-09-08 NOTE — Telephone Encounter (Signed)
Approved: okay for a year Have her set up a physical in the next few months

## 2017-10-25 ENCOUNTER — Other Ambulatory Visit: Payer: Self-pay | Admitting: Internal Medicine

## 2017-10-25 NOTE — Telephone Encounter (Signed)
Alprazolam last filled 06-22-17 #270. Last OV 08-11-16 Next OV 10-27-17

## 2017-10-25 NOTE — Telephone Encounter (Signed)
Rx faxed to [employer]

## 2017-10-25 NOTE — Telephone Encounter (Signed)
Approved: #270 x 0

## 2017-10-27 ENCOUNTER — Ambulatory Visit (INDEPENDENT_AMBULATORY_CARE_PROVIDER_SITE_OTHER): Payer: Medicare HMO | Admitting: Internal Medicine

## 2017-10-27 ENCOUNTER — Encounter: Payer: Self-pay | Admitting: Internal Medicine

## 2017-10-27 ENCOUNTER — Ambulatory Visit (INDEPENDENT_AMBULATORY_CARE_PROVIDER_SITE_OTHER)
Admission: RE | Admit: 2017-10-27 | Discharge: 2017-10-27 | Disposition: A | Discharge status: Home / Self Care | Payer: Medicare HMO | Source: Ambulatory Visit | Attending: Internal Medicine | Admitting: Internal Medicine

## 2017-10-27 VITALS — BP 122/62 | HR 70 | Temp 97.7°F | Ht 59.25 in | Wt 100.8 lb

## 2017-10-27 DIAGNOSIS — J449 Chronic obstructive pulmonary disease, unspecified: Secondary | ICD-10-CM

## 2017-10-27 DIAGNOSIS — Z7189 Other specified counseling: Secondary | ICD-10-CM | POA: Insufficient documentation

## 2017-10-27 DIAGNOSIS — Z23 Encounter for immunization: Secondary | ICD-10-CM

## 2017-10-27 DIAGNOSIS — R05 Cough: Secondary | ICD-10-CM | POA: Diagnosis not present

## 2017-10-27 DIAGNOSIS — R490 Dysphonia: Secondary | ICD-10-CM | POA: Diagnosis not present

## 2017-10-27 DIAGNOSIS — F39 Unspecified mood [affective] disorder: Secondary | ICD-10-CM | POA: Diagnosis not present

## 2017-10-27 DIAGNOSIS — I10 Essential (primary) hypertension: Secondary | ICD-10-CM | POA: Diagnosis not present

## 2017-10-27 DIAGNOSIS — Z0000 Encounter for general adult medical examination without abnormal findings: Secondary | ICD-10-CM

## 2017-10-27 DIAGNOSIS — G40909 Epilepsy, unspecified, not intractable, without status epilepticus: Secondary | ICD-10-CM

## 2017-10-27 DIAGNOSIS — G8929 Other chronic pain: Secondary | ICD-10-CM

## 2017-10-27 LAB — COMPREHENSIVE METABOLIC PANEL
ALBUMIN: 4.2 g/dL (ref 3.5–5.2)
ALT: 8 U/L (ref 0–35)
AST: 16 U/L (ref 0–37)
Alkaline Phosphatase: 93 U/L (ref 39–117)
BILIRUBIN TOTAL: 0.4 mg/dL (ref 0.2–1.2)
BUN: 6 mg/dL (ref 6–23)
CO2: 34 mEq/L — ABNORMAL HIGH (ref 19–32)
CREATININE: 0.98 mg/dL (ref 0.40–1.20)
Calcium: 9.5 mg/dL (ref 8.4–10.5)
Chloride: 97 mEq/L (ref 96–112)
GFR: 61.98 mL/min (ref >60.00)
GLUCOSE: 80 mg/dL (ref 70–99)
Potassium: 4.1 mEq/L (ref 3.5–5.1)
Sodium: 136 mEq/L (ref 135–145)
TOTAL PROTEIN: 7.6 g/dL (ref 6.0–8.3)

## 2017-10-27 LAB — CBC
HCT: 36.1 % (ref 36.0–46.0)
HEMOGLOBIN: 12.1 g/dL (ref 12.0–15.0)
MCHC: 33.6 g/dL (ref 30.0–36.0)
MCV: 86.5 fl (ref 78.0–100.0)
Platelets: 438 10*3/uL — ABNORMAL HIGH (ref 150.0–400.0)
RBC: 4.17 Mil/uL (ref 3.87–5.11)
RDW: 15 % (ref 11.5–15.5)
WBC: 8 10*3/uL (ref 4.0–10.5)

## 2017-10-27 MED ORDER — TETANUS-DIPHTHERIA TOXOIDS TD 5-2 LFU IM INJ
0.5000 mL | INJECTION | Freq: Once | INTRAMUSCULAR | 0 refills | Status: AC
Start: 2017-10-27 — End: 2017-10-27

## 2017-10-27 MED ORDER — GABAPENTIN 300 MG PO CAPS: ORAL_CAPSULE | ORAL | 3 refills | Status: DC

## 2017-10-27 MED ORDER — ESTRADIOL 0.1 MG/GM VA CREA: 1.0000 | TOPICAL_CREAM | VAGINAL | 3 refills | Status: DC

## 2017-10-27 NOTE — Assessment & Plan Note (Signed)
I have personally reviewed the Medicare Annual Wellness questionnaire and have noted 1. The patient's medical and social history 2. Their use of alcohol, tobacco or illicit drugs 3. Their current medications and supplements 4. The patient's functional ability including ADL's, fall risks, home safety risks and hearing or visual             impairment. 5. Diet and physical activities 6. Evidence for depression or mood disorders  The patients weight, height, BMI and visual acuity have been recorded in the chart I have made referrals, counseling and provided education to the patient based review of the above and I have provided the pt with a written personalized care plan for preventive services.  I have provided you with a copy of your personalized plan for preventive services. Please take the time to review along with your updated medication list.  Will give prevnar and flu vaccines Colonoscopy due next year She will set up mammo No pap due to hyster

## 2017-10-27 NOTE — Assessment & Plan Note (Signed)
Counseled about cigarette cessation Check CXR Referral for lung cancer screening

## 2017-10-27 NOTE — Progress Notes (Signed)
Subjective:    Patient ID: Molly Peters, female    DOB: [birth year], 56 y.o.   MRN: [id number]  HPI Here for initial Medicare wellness visit and follow up of chronic health conditions Reviewed form and advanced directives Reviewed other doctors No alcohol Still smoking Vision is fine Some hearing problems---?cerumen No exercise No falls Chronic mood issues---chronic depression Some memory issues Husband does the instrumental ADLs and bills   Something "going on in throat" Worsening hoarseness during the day Pills and sometimes food get caught Some heartburn Not regular with protonix--plans to start again Also with constant "sinus" drainage she feels in her throat  Still smoking--considering it Getting choked on the cigarettes Breathing is not good Constant cough with sputum  Chronic depression and anxiety Xanax 2-3 times a day Still on paxil Sleep still not great--awakens a few times. Doesn't fall asleep till 2AM  Chronic pain issues Mostly back, shoulders and knees Still no narcotics or pain follow up Uses the marijuana with some success  No seizures Continues on the lamictal  Some chest pain--mostly at night Occasional chronic palpitations Slight dizziness if she stands quickly --- no syncope No edema  Current Outpatient Medications on File Prior to Visit  Medication Sig Dispense Refill  . albuterol (PROVENTIL HFA;VENTOLIN HFA) 108 (90 Base) MCG/ACT inhaler Inhale 1-2 puffs into the lungs every 6 (six) hours as needed for wheezing or shortness of breath. 1 Inhaler 0  . ALPRAZolam (XANAX) 1 MG tablet TAKE 1 TABLET THREE TIMES DAILY AS NEEDED FOR ANXIETY 270 tablet 0  . baclofen (LIORESAL) 10 MG tablet Take 1 tablet (10 mg total) by mouth 3 (three) times daily. As needed for muscle spasm 270 tablet 0  . gabapentin (NEURONTIN) 300 MG capsule TAKE 1 CAPSULE TWICE DAILY AND TAKE 2 CAPSULES AT BEDTIME. 360 capsule 1  . hydrOXYzine (ATARAX/VISTARIL) 50 MG  tablet Take 1 tablet (50 mg total) by mouth 3 (three) times daily as needed for itching. 270 tablet 0  . lamoTRIgine (LAMICTAL) 100 MG tablet TAKE 1 TABLET TWICE DAILY 180 tablet 3  . losartan-hydrochlorothiazide (HYZAAR) 100-12.5 MG tablet TAKE 1 TABLET EVERY DAY 90 tablet 0  . pantoprazole (PROTONIX) 40 MG tablet Take 1 tablet (40 mg total) by mouth daily. 90 tablet 3  . PARoxetine (PAXIL) 20 MG tablet TAKE 1 TABLET EVERY MORNING 90 tablet 3  . Cholecalciferol (VITAMIN D3) 2000 units capsule Take 1 capsule (2,000 Units total) by mouth daily. (Patient not taking: Reported on 10/27/2017) 30 capsule PRN  . potassium chloride (K-DUR) 10 MEQ tablet Take 10 mEq by mouth 2 (two) times daily. Reported on 04/20/2016     No current facility-administered medications on file prior to visit.     Allergies  Allergen Reactions  . Carbamazepine Hives  . Divalproex Sodium Swelling    HAIR FALLS OUT  . Clonazepam Other (See Comments)    HALLUCINATIONS HALLUCINATIONS  . Divalproex Sodium Swelling    HAIR FALLS OUT  . Diphenhydramine Hcl Other (See Comments)    UNKNOWN  . Diphenhydramine Hcl Rash    UNKNOWN  . Tiotropium Other (See Comments)    CREATES YEAST IN THROAT  . Tiotropium Bromide Monohydrate Other (See Comments)    CREATES YEAST IN THROAT  . Vicodin [Hydrocodone-Acetaminophen] Itching    Past Medical History:  Diagnosis Date  . Anxiety   . Chronic back pain 11/12/2009   Qualifier: Diagnosis of  By: Maxie Better FNP, Rosalita Levan   . COPD (chronic obstructive pulmonary  disease) ([place])    states SOB with ADLs; no O2 use; able to speak in complete sentences without SOB(11/15/2014)  . Depression   . Difficulty swallowing pills    s/p cervical fusion  . Family history of adverse reaction to anesthesia    pt's mother and sister have hx. of post-op N/V  . Fibromyalgia   . GERD (gastroesophageal reflux disease)   . History of gastric ulcer   . Hypertension    states under control with med.,  has been on med. x 1-2 yr.  . IBS (irritable bowel syndrome)   . Internal hemorrhoid    states has had intermittent bright red bleeding with BM (11/15/2014)  . Limited joint range of motion    neck - s/p cervical fusion  . Localized primary osteoarthritis of right shoulder region 11/23/2014  . Osteoarthritis of left shoulder 07/05/2015  . Osteoarthritis of right shoulder 11/2014  . Seizures ([place])    last seizure 2010  . Short-term memory loss   . TMJ syndrome   . Valvular heart disease    Initial workup a number of years ago at [facility].  Echo (10/2009) showed EF 60-65%,      normal LV size, moderate aortic insufficiency with a trileaflet aortic valve and normal aortic root size.  Mild      mitral regurgitation.   . Wears dentures    lower    Past Surgical History:  Procedure Laterality Date  . ABDOMINAL HYSTERECTOMY     partial  . ANTERIOR CERVICAL DECOMP/DISCECTOMY FUSION  02/06/2011   C4-5, C5-6, C6-7  . BREAST LUMPECTOMY Bilateral 1989   x 3 - benign  . CARDIAC CATHETERIZATION  1995  . CESAREAN SECTION     x 2  . COLONOSCOPY  09/28/2008  . ESOPHAGOGASTRODUODENOSCOPY  09/28/2008  . HARDWARE REMOVAL  11/17/2006   L5-S1  . LAMINECTOMY WITH POSTERIOR LATERAL ARTHRODESIS LEVEL 3  12/06/2009   with synovial cyst resection L3-4 bilat.  Marland Kitchen LAMINECTOMY WITH POSTERIOR LATERAL ARTHRODESIS LEVEL 3  11/17/2006   L4-5  . NM MYOVIEW LTD     Lexiscan myoview (10/2009): EF 77%, normal wall motion, normal perfusion.   Marland Kitchen SHOULDER ARTHROSCOPY WITH DEBRIDEMENT AND BICEP TENDON REPAIR Right 04/13/2014   Procedure: RIGHT SHOULDER ARTHROSCOPY WITH DEBRIDEMENT EXTENSIVE;  Surgeon: Johnny Bridge, MD;  Location: [place];  Service: Orthopedics;  Laterality: Right;  . TOTAL SHOULDER ARTHROPLASTY Right 11/23/2014   Procedure: TOTAL RIGHT SHOULDER ARTHROPLASTY;  Surgeon: Johnny Bridge, MD;  Location: [place];  Service: Orthopedics;  Laterality: Right;  . TOTAL SHOULDER  ARTHROPLASTY Left 07/05/2015   Procedure: LEFT TOTAL SHOULDER REPLACEMENT;  Surgeon: Marchia Bond, MD;  Location: [place];  Service: Orthopedics;  Laterality: Left;    Family History  Problem Relation Age of Onset  . Cervical cancer Mother   . Anesthesia problems Mother        post-op N/V  . Rheum arthritis Mother   . Anxiety disorder Mother   . Healthy Father   . Cancer Father        colon cancer  . Migraines Sister   . Cervical cancer Sister   . Anesthesia problems Sister        post-op N/V  . Migraines Brother   . Asthma Daughter   . Coronary artery disease Maternal Grandmother   . Hypertension Maternal Grandmother   . Diabetes Maternal Grandmother   . Coronary artery disease Paternal Grandmother   .  Hypertension Paternal Grandmother   . Diabetes Paternal Grandmother     Social History   Socioeconomic History  . Marital status: Married    Spouse name: Not on file  . Number of children: 3  . Years of education: Not on file  . Highest education level: Not on file  Social Needs  . Financial resource strain: Not on file  . Food insecurity - worry: Not on file  . Food insecurity - inability: Not on file  . Transportation needs - medical: Not on file  . Transportation needs - non-medical: Not on file  Occupational History  . Occupation: Disabled 2003  Tobacco Use  . Smoking status: Current Every Day Smoker    Packs/day: 1.50    Years: 42.00    Pack years: 63.00    Types: Cigarettes  . Smokeless tobacco: Never Used  Substance and Sexual Activity  . Alcohol use: No    Alcohol/week: 0.0 oz    Comment: occasionally  . Drug use: No  . Sexual activity: Not on file  Other Topics Concern  . Not on file  Social History Narrative   No living will   Husband, then oldest son Roosvelt Harps, should make decisions   Would accept resuscitation   No tube feeds      Review of Systems Appetite not great Weight is down again some Stays tired Bottom  dentures--no recent dental visit Dry skin but no rash or suspicious skin lesions. Having pain with sex--actually tears a bit. Interested in Rx Ongoing restless legs--will affect sleep at times Bowels are okay Wears seat belt Pain between right great and 2nd toes with walking (?Morton's neuroma--discussed seeing podiatrist)     Objective:   Physical Exam  Constitutional: She is oriented to person, place, and time. No distress.  HENT:  Mouth/Throat: Oropharynx is clear and moist. No oropharyngeal exudate.  Neck: No thyromegaly present.  Cardiovascular: Normal rate and regular rhythm. Exam reveals no gallop.  Same systolic and diastolic murmurs  Pulmonary/Chest: No respiratory distress. She has no wheezes. She has no rales.  Decreased breath sounds but clear  Abdominal: Soft. There is no tenderness. There is no rebound.  Musculoskeletal: She exhibits no edema or tenderness.  Lymphadenopathy:    She has no cervical adenopathy.  Neurological: She is alert and oriented to person, place, and time.  President-- "Sheela Stack--- Obama" 100-93-? D-l-o-w Recall 3/3  Skin: No rash noted. No erythema.  Psychiatric: She has a normal mood and affect. Her behavior is normal.          Assessment & Plan:

## 2017-10-27 NOTE — Assessment & Plan Note (Signed)
BP Readings from Last 3 Encounters:  10/27/17 122/62  08/11/16 116/80  01/29/16 138/79   Good control Due for labs

## 2017-10-27 NOTE — Assessment & Plan Note (Signed)
None on the lamictal 

## 2017-10-27 NOTE — Assessment & Plan Note (Signed)
No narcotics for some time Generally finds the marijuana helpful

## 2017-10-27 NOTE — Progress Notes (Signed)
Hearing Screening   125Hz  250Hz  500Hz  1000Hz  2000Hz  3000Hz  4000Hz  6000Hz  8000Hz   Right ear:   0 0 40  40    Left ear:   0 0 40  40    Vision Screening Comments: Completed summer 2018 [facility]

## 2017-10-27 NOTE — Assessment & Plan Note (Signed)
Chronic anxiety and dysthymia Will continue current meds

## 2017-10-27 NOTE — Addendum Note (Signed)
Addended by: Modena Nunnery on: 10/27/2017 10:47 AM   Modules accepted: Orders

## 2017-10-27 NOTE — Patient Instructions (Addendum)
Please restart the pantoprazole every day. Stop smoking. Use a daily nicotine patch (like 21mg ) for at least 2-3 months and take a nicotine lozenge for any urges to smoke (so you don't take a cigarette) Please set up your screening mammogram.

## 2017-10-27 NOTE — Assessment & Plan Note (Signed)
See social history 

## 2017-10-27 NOTE — Assessment & Plan Note (Signed)
With dysphagia and drainage--likely GERD Restart PPI daily ENT eval

## 2017-11-10 ENCOUNTER — Telehealth: Payer: Self-pay | Admitting: *Deleted

## 2017-11-10 DIAGNOSIS — Z122 Encounter for screening for malignant neoplasm of respiratory organs: Secondary | ICD-10-CM

## 2017-11-10 DIAGNOSIS — Z87891 Personal history of nicotine dependence: Secondary | ICD-10-CM

## 2017-11-10 NOTE — Telephone Encounter (Signed)
Received referral for initial lung cancer screening scan. Contacted patient and obtained smoking history,(current, 63 pack year) as well as answering questions related to screening process. Patient denies signs of lung cancer such as weight loss or hemoptysis. Patient denies comorbidity that would prevent curative treatment if lung cancer were found. Patient is scheduled for shared decision making visit and CT scan on 11/23/17.

## 2017-11-23 ENCOUNTER — Inpatient Hospital Stay: Payer: Medicare HMO | Admitting: Nurse Practitioner

## 2017-11-23 ENCOUNTER — Ambulatory Visit: Admission: RE | Admit: 2017-11-23 | Payer: Medicare HMO | Source: Ambulatory Visit

## 2017-11-25 ENCOUNTER — Other Ambulatory Visit: Payer: Self-pay | Admitting: Otolaryngology

## 2017-11-25 DIAGNOSIS — R221 Localized swelling, mass and lump, neck: Secondary | ICD-10-CM

## 2017-11-25 DIAGNOSIS — R490 Dysphonia: Secondary | ICD-10-CM | POA: Diagnosis not present

## 2017-11-25 DIAGNOSIS — J310 Chronic rhinitis: Secondary | ICD-10-CM | POA: Diagnosis not present

## 2017-11-25 DIAGNOSIS — J370 Chronic laryngitis: Secondary | ICD-10-CM | POA: Diagnosis not present

## 2017-11-25 DIAGNOSIS — K219 Gastro-esophageal reflux disease without esophagitis: Secondary | ICD-10-CM | POA: Diagnosis not present

## 2017-11-25 DIAGNOSIS — F17210 Nicotine dependence, cigarettes, uncomplicated: Secondary | ICD-10-CM | POA: Diagnosis not present

## 2017-12-02 ENCOUNTER — Telehealth: Payer: Self-pay | Admitting: Internal Medicine

## 2017-12-02 NOTE — Telephone Encounter (Signed)
Copied from [place] [id number]. Topic: Quick Communication - Rx Refill/Question >> Dec 02, 2017  6:38 PM Oliver Pila B wrote: Medication: estradiol (ESTRACE VAGINAL) 0.1 MG/GM vaginal cream [[id number]]    Pharrmacy is needing a new Rx

## 2017-12-03 MED ORDER — ESTRADIOL 0.1 MG/GM VA CREA: TOPICAL_CREAM | VAGINAL | 3 refills | Status: DC

## 2017-12-03 NOTE — Telephone Encounter (Signed)
I spoke with Molly Peters at [facility] and they do not need a new rx but they do need to know how many grams of med in applicator to be inserted each time. The instructions one applicator full needs clarification for pharmacy.thank you.

## 2017-12-03 NOTE — Telephone Encounter (Signed)
0.5gm daily

## 2017-12-03 NOTE — Telephone Encounter (Signed)
Spoke to [employer]	[place] at [place]. Anew rx needs to be sent. A new one was sent.

## 2017-12-06 ENCOUNTER — Ambulatory Visit
Admission: RE | Admit: 2017-12-06 | Discharge: 2017-12-06 | Disposition: A | Discharge status: Home / Self Care | Payer: Medicare HMO | Source: Ambulatory Visit | Attending: Otolaryngology | Admitting: Otolaryngology

## 2017-12-06 DIAGNOSIS — M6788 Other specified disorders of synovium and tendon, other site: Secondary | ICD-10-CM | POA: Insufficient documentation

## 2017-12-06 DIAGNOSIS — Z888 Allergy status to other drugs, medicaments and biological substances status: Secondary | ICD-10-CM | POA: Diagnosis not present

## 2017-12-06 DIAGNOSIS — Z87891 Personal history of nicotine dependence: Secondary | ICD-10-CM | POA: Insufficient documentation

## 2017-12-06 DIAGNOSIS — Z981 Arthrodesis status: Secondary | ICD-10-CM | POA: Diagnosis not present

## 2017-12-06 DIAGNOSIS — R221 Localized swelling, mass and lump, neck: Secondary | ICD-10-CM | POA: Diagnosis not present

## 2017-12-06 DIAGNOSIS — Z87898 Personal history of other specified conditions: Secondary | ICD-10-CM | POA: Diagnosis not present

## 2017-12-06 DIAGNOSIS — Z885 Allergy status to narcotic agent status: Secondary | ICD-10-CM | POA: Diagnosis not present

## 2017-12-06 DIAGNOSIS — J432 Centrilobular emphysema: Secondary | ICD-10-CM | POA: Insufficient documentation

## 2017-12-06 MED ORDER — IOPAMIDOL (ISOVUE-300) INJECTION 61%
75.0000 mL | Freq: Once | INTRAVENOUS | Status: AC | PRN
  Administered 2017-12-06: 75 mL via INTRAVENOUS

## 2017-12-10 ENCOUNTER — Ambulatory Visit: Payer: Medicare HMO

## 2017-12-10 ENCOUNTER — Inpatient Hospital Stay: Payer: Medicare HMO | Admitting: Nurse Practitioner

## 2017-12-25 ENCOUNTER — Other Ambulatory Visit: Payer: Self-pay | Admitting: Internal Medicine

## 2017-12-27 NOTE — Telephone Encounter (Signed)
Last written 10-26-17 #270 to mail order Last OV 10-27-17 No Future OV

## 2017-12-27 NOTE — Telephone Encounter (Signed)
It is too soon 

## 2018-01-11 ENCOUNTER — Encounter: Payer: Self-pay | Admitting: Emergency Medicine

## 2018-01-11 ENCOUNTER — Emergency Department
Admission: EM | Admit: 2018-01-11 | Discharge: 2018-01-11 | Disposition: A | Discharge status: Home / Self Care | Payer: Medicare HMO | Attending: Emergency Medicine | Admitting: Emergency Medicine

## 2018-01-11 ENCOUNTER — Emergency Department: Payer: Medicare HMO

## 2018-01-11 DIAGNOSIS — I10 Essential (primary) hypertension: Secondary | ICD-10-CM | POA: Insufficient documentation

## 2018-01-11 DIAGNOSIS — F419 Anxiety disorder, unspecified: Secondary | ICD-10-CM | POA: Diagnosis not present

## 2018-01-11 DIAGNOSIS — J449 Chronic obstructive pulmonary disease, unspecified: Secondary | ICD-10-CM | POA: Insufficient documentation

## 2018-01-11 DIAGNOSIS — F17210 Nicotine dependence, cigarettes, uncomplicated: Secondary | ICD-10-CM | POA: Insufficient documentation

## 2018-01-11 DIAGNOSIS — Z79899 Other long term (current) drug therapy: Secondary | ICD-10-CM | POA: Insufficient documentation

## 2018-01-11 DIAGNOSIS — S0990XA Unspecified injury of head, initial encounter: Secondary | ICD-10-CM | POA: Diagnosis not present

## 2018-01-11 DIAGNOSIS — Y92009 Unspecified place in unspecified non-institutional (private) residence as the place of occurrence of the external cause: Secondary | ICD-10-CM | POA: Diagnosis not present

## 2018-01-11 DIAGNOSIS — W07XXXA Fall from chair, initial encounter: Secondary | ICD-10-CM | POA: Insufficient documentation

## 2018-01-11 DIAGNOSIS — F329 Major depressive disorder, single episode, unspecified: Secondary | ICD-10-CM | POA: Insufficient documentation

## 2018-01-11 DIAGNOSIS — S0003XA Contusion of scalp, initial encounter: Secondary | ICD-10-CM | POA: Diagnosis not present

## 2018-01-11 DIAGNOSIS — Y9389 Activity, other specified: Secondary | ICD-10-CM | POA: Insufficient documentation

## 2018-01-11 DIAGNOSIS — Y998 Other external cause status: Secondary | ICD-10-CM | POA: Diagnosis not present

## 2018-01-11 DIAGNOSIS — Z96643 Presence of artificial hip joint, bilateral: Secondary | ICD-10-CM | POA: Insufficient documentation

## 2018-01-11 DIAGNOSIS — R51 Headache: Secondary | ICD-10-CM | POA: Diagnosis not present

## 2018-01-11 NOTE — ED Triage Notes (Signed)
First Nurse Note:  AAOx3.  Skin warm and dry. NAD.

## 2018-01-11 NOTE — ED Triage Notes (Signed)
Pt to ED after a fall that took place on Sunday. Pt fell forward and hit her head. Pt denies LOC.  Pt presents with bruising to both eyes. Pt states headache since fall.

## 2018-01-11 NOTE — ED Provider Notes (Signed)
[facility] Emergency Department Provider Note   ____________________________________________    I have reviewed the triage vital signs and the nursing notes.   HISTORY  Chief Complaint Fall     HPI Molly Peters is a 57 y.o. female who presents to the ED after a fall with head injury.  Fall apparently happened late Sunday night.  Patient got out of her reclining chair, push down on the elevated foot rest lost her balance and fell forward and struck her head on a coffee table.  No LOC.  Has had some frontal headache.  Not on blood thinners.  Presents today because of bruising of both eyelids.  No neuro deficits.  No nausea or vomiting   Past Medical History:  Diagnosis Date  . Anxiety   . Cancer ([place])   . Chronic back pain 11/12/2009   Qualifier: Diagnosis of  By: Maxie Better FNP, Rosalita Levan   . COPD (chronic obstructive pulmonary disease) ([facility])    states SOB with ADLs; no O2 use; able to speak in complete sentences without SOB(11/15/2014)  . Depression   . Difficulty swallowing pills    s/p cervical fusion  . Family history of adverse reaction to anesthesia    pt's mother and sister have hx. of post-op N/V  . Fibromyalgia   . GERD (gastroesophageal reflux disease)   . History of gastric ulcer   . Hypertension    states under control with med., has been on med. x 1-2 yr.  . IBS (irritable bowel syndrome)   . Internal hemorrhoid    states has had intermittent bright red bleeding with BM (11/15/2014)  . Limited joint range of motion    neck - s/p cervical fusion  . Localized primary osteoarthritis of right shoulder region 11/23/2014  . Osteoarthritis of left shoulder 07/05/2015  . Osteoarthritis of right shoulder 11/2014  . Seizures ([place])    last seizure 2010  . Short-term memory loss   . TMJ syndrome   . Valvular heart disease    Initial workup a [facility] of years ago at [facility].  Echo (10/2009) showed EF 60-65%,      normal LV size, moderate  aortic insufficiency with a trileaflet aortic valve and normal aortic root size.  Mild      mitral regurgitation.   . Wears dentures    lower    Patient Active Problem List   Diagnosis Date Noted  . Hoarseness 10/27/2017  . Advance directive discussed with patient 10/27/2017  . Neuropathy 08/11/2016  . Vitamin D deficiency 03/15/2016  . 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 Hx of cervical spine surgery (Left ACDF) 02/02/2016  . Chronic pain 01/29/2016  . Marijuana use 01/29/2016  . Osteoarthritis of shoulder (Left) 07/05/2015  . S/P Bilateral Total Shoulder Replacement (2016) 11/23/2014  . Osteoarthritis of shoulder (Right) 04/13/2014  . Routine general medical examination at a health care facility 04/05/2013  . CAROTID BRUIT, LEFT 01/02/2011  . Restless legs syndrome (RLS) 10/21/2010  . Essential hypertension, benign 06/23/2010  . HYPERLIPIDEMIA-MIXED 10/23/2009  . Valvular heart disease 03/27/2009  . IRRITABLE BOWEL SYNDROME 11/13/2008  . Mood disorder ([place]) 09/07/2007  . Tobacco use disorder 09/07/2007  . GLAUCOMA 09/07/2007  . HEMORRHOIDS 09/07/2007  . GASTRIC ULCER 09/07/2007  . COPD (chronic obstructive pulmonary disease) with chronic bronchitis ([place]) 08/08/2007  . GERD 08/08/2007  . Seizure disorder ([place]) 08/08/2007    Past Surgical History:  Procedure Laterality  Date  . ABDOMINAL HYSTERECTOMY     partial  . ANTERIOR CERVICAL DECOMP/DISCECTOMY FUSION  02/06/2011   C4-5, C5-6, C6-7  . BREAST LUMPECTOMY Bilateral 1989   x 3 - benign  . CARDIAC CATHETERIZATION  1995  . CESAREAN SECTION     x 2  . COLONOSCOPY  09/28/2008  . ESOPHAGOGASTRODUODENOSCOPY  09/28/2008  . HARDWARE REMOVAL  11/17/2006   L5-S1  . LAMINECTOMY WITH POSTERIOR LATERAL ARTHRODESIS LEVEL 3  12/06/2009   with synovial cyst resection L3-4 bilat.  Marland Kitchen LAMINECTOMY WITH POSTERIOR LATERAL ARTHRODESIS LEVEL 3  11/17/2006   L4-5  . NM MYOVIEW LTD     Lexiscan  myoview (10/2009): EF 77%, normal wall motion, normal perfusion.   Marland Kitchen SHOULDER ARTHROSCOPY WITH DEBRIDEMENT AND BICEP TENDON REPAIR Right 04/13/2014   Procedure: RIGHT SHOULDER ARTHROSCOPY WITH DEBRIDEMENT EXTENSIVE;  Surgeon: Johnny Bridge, MD;  Location: [place];  Service: Orthopedics;  Laterality: Right;  . TOTAL SHOULDER ARTHROPLASTY Right 11/23/2014   Procedure: TOTAL RIGHT SHOULDER ARTHROPLASTY;  Surgeon: Johnny Bridge, MD;  Location: [place];  Service: Orthopedics;  Laterality: Right;  . TOTAL SHOULDER ARTHROPLASTY Left 07/05/2015   Procedure: LEFT TOTAL SHOULDER REPLACEMENT;  Surgeon: Marchia Bond, MD;  Location: [place];  Service: Orthopedics;  Laterality: Left;    Prior to Admission medications   Medication Sig Start Date End Date Taking? Authorizing Provider  albuterol (PROVENTIL HFA;VENTOLIN HFA) 108 (90 Base) MCG/ACT inhaler Inhale 1-2 puffs into the lungs every 6 (six) hours as needed for wheezing or shortness of breath. 05/22/16   Venia Carbon, MD  ALPRAZolam Duanne Moron) 1 MG tablet TAKE 1 TABLET THREE TIMES DAILY AS NEEDED FOR ANXIETY 10/25/17   Venia Carbon, MD  baclofen (LIORESAL) 10 MG tablet Take 1 tablet (10 mg total) by mouth 3 (three) times daily. As needed for muscle spasm 01/01/16   Venia Carbon, MD  Cholecalciferol (VITAMIN D3) 2000 units capsule Take 1 capsule (2,000 Units total) by mouth daily. Patient not taking: Reported on 10/27/2017 03/15/16   Milinda Pointer, MD  estradiol (ESTRACE VAGINAL) 0.1 MG/GM vaginal cream Use 3/4 of an applicator full 3 times a week (0.5gm 3 times a week) 12/03/17   Venia Carbon, MD  gabapentin (NEURONTIN) 300 MG capsule 1 twice a day and 2 at bedtime 10/27/17   Venia Carbon, MD  hydrOXYzine (ATARAX/VISTARIL) 50 MG tablet Take 1 tablet (50 mg total) by mouth 3 (three) times daily as needed for itching. 01/01/16   Venia Carbon, MD  lamoTRIgine (LAMICTAL) 100 MG  tablet TAKE 1 TABLET TWICE DAILY 04/27/17   Venia Carbon, MD  losartan-hydrochlorothiazide (HYZAAR) 100-12.5 MG tablet TAKE 1 TABLET EVERY DAY 04/07/17   Venia Carbon, MD  pantoprazole (PROTONIX) 40 MG tablet Take 1 tablet (40 mg total) by mouth daily. 06/08/16   Venia Carbon, MD  PARoxetine (PAXIL) 20 MG tablet TAKE 1 TABLET EVERY MORNING 09/08/17   Viviana Simpler I, MD  potassium chloride (K-DUR) 10 MEQ tablet Take 10 mEq by mouth 2 (two) times daily. Reported on 04/20/2016    [provider]     Allergies Carbamazepine; Divalproex sodium; Clonazepam; Divalproex sodium; Diphenhydramine hcl; Diphenhydramine hcl; Tiotropium; Tiotropium bromide monohydrate; and Vicodin [hydrocodone-acetaminophen]  Family History  Problem Relation Age of Onset  . Cervical cancer Mother   . Anesthesia problems Mother        post-op N/V  . Rheum arthritis Mother   .  Anxiety disorder Mother   . Healthy Father   . Cancer Father        colon cancer  . Migraines Sister   . Cervical cancer Sister   . Anesthesia problems Sister        post-op N/V  . Migraines Brother   . Asthma Daughter   . Coronary artery disease Maternal Grandmother   . Hypertension Maternal Grandmother   . Diabetes Maternal Grandmother   . Coronary artery disease Paternal Grandmother   . Hypertension Paternal Grandmother   . Diabetes Paternal Grandmother     Social History Social History   Tobacco Use  . Smoking status: Current Every Day Smoker    Packs/day: 1.50    Years: 42.00    Pack years: 63.00    Types: Cigarettes  . Smokeless tobacco: Never Used  Substance Use Topics  . Alcohol use: No    Alcohol/week: 0.0 oz    Comment: occasionally  . Drug use: No    Review of Systems  Constitutional: No dizziness Eyes: No visual changes.  ENT: No neck pain Cardiovascular: Denies palpitations Respiratory: Denies shortness of breath. Gastrointestinal:  No nausea, no vomiting.   Genitourinary: Negative  for dysuria. Musculoskeletal: Negative for back pain. Skin: Bruising to the eyelid Neurological: Mild frontal headache   ____________________________________________   PHYSICAL EXAM:  VITAL SIGNS: ED Triage Vitals  Enc Vitals Group     BP 01/11/18 1133 (!) 141/93     Pulse Rate 01/11/18 1133 82     Resp 01/11/18 1133 18     Temp 01/11/18 1133 98.3 F (36.8 C)     Temp Source 01/11/18 1133 Oral     SpO2 01/11/18 1133 98 %     Weight 01/11/18 1133 45.4 kg (100 lb)     Height 01/11/18 1133 1.524 m (5')     Head Circumference --      Peak Flow --      Pain Score 01/11/18 1149 6     Pain Loc --      Pain Edu? --      Excl. in [place]? --     Constitutional: Alert and oriented. No acute distress. Pleasant and interactive Eyes: Conjunctivae are normal.  Head: Hematoma noted to the center of the forehead with bruising to both eyelids Nose: No congestion/rhinnorhea. Mouth/Throat: Mucous membranes are moist.   Neck:  Painless ROM, no vertebral tenderness to palpation Cardiovascular:  Good peripheral circulation. Respiratory: Normal respiratory effort.  No retractions.    Musculoskeletal: Moves all extremity's equally .warm and well perfused Neurologic:  Normal speech and language. No gross focal neurologic deficits are appreciated.  Skin:  Skin is warm, dry and intact. No rash noted. Psychiatric: Mood and affect are normal. Speech and behavior are normal.  ____________________________________________   LABS (all labs ordered are listed, but only abnormal results are displayed)  Labs Reviewed - No data to display ____________________________________________  EKG  None ____________________________________________  RADIOLOGY  CT head no acute abnormality ____________________________________________   PROCEDURES  Procedure(s) performed: No  Procedures   Critical Care performed: No ____________________________________________   INITIAL IMPRESSION / ASSESSMENT  AND PLAN / ED COURSE  Pertinent labs & imaging results that were available during my care of the patient were reviewed by me and considered in my medical decision making (see chart for details).  Patient overall well-appearing in no acute distress, bruising to the eyelids related to hematoma on the forehead.  Reassured the patient, recommend supportive care.  ____________________________________________   FINAL CLINICAL IMPRESSION(S) / ED DIAGNOSES  Final diagnoses:  Contusion of scalp, initial encounter  Injury of head, initial encounter        Note:  This document was prepared using Dragon voice recognition software and may include unintentional dictation errors.    Lavonia Drafts, MD 01/11/18 [phone]

## 2018-01-13 ENCOUNTER — Telehealth: Payer: Self-pay

## 2018-01-13 NOTE — Telephone Encounter (Signed)
Thank you for the update!

## 2018-01-13 NOTE — Telephone Encounter (Signed)
Copied from [employer] [id number]. Topic: Quick Communication - Office Called Patient >> Jan 13, 2018  9:40 AM Pilar Grammes, CMA wrote: Reason for CRM: Called pt to find out how she was doing after recent ER visit for a contusion. PEC: If pt calls back, please ask how she is doing and send it back to me. Thanks

## 2018-01-13 NOTE — Telephone Encounter (Signed)
Copied from [place] [id number]. Topic: Quick Communication - Office Called Patient >> Jan 13, 2018  9:40 AM Pilar Grammes, CMA wrote: Reason for CRM: Called pt to see how she was feeling after her recent ER visit 01-11-18 for a contusion.  PEC: If pt calls back, please find out how she is doing and send it back to me. Thanks >> Jan 13, 2018 10:49 AM Percell Belt A wrote: Pt called back in and said that she is feeling ok.  She has a headache.  The bruising  is going all the way around her eye now.  Little wobbly but other that she is feeling ok.

## 2018-01-21 ENCOUNTER — Other Ambulatory Visit: Payer: Self-pay | Admitting: Internal Medicine

## 2018-01-24 ENCOUNTER — Telehealth: Payer: Self-pay | Admitting: *Deleted

## 2018-01-24 NOTE — Telephone Encounter (Signed)
Lung screening rescheduled to 02/02/18, please see prior note for eligibility information.

## 2018-01-24 NOTE — Telephone Encounter (Signed)
Last filled 10-26-17 #270 Last OV/CPE 10-27-17 No Future OV

## 2018-02-02 ENCOUNTER — Ambulatory Visit
Admission: RE | Admit: 2018-02-02 | Discharge: 2018-02-02 | Disposition: A | Discharge status: Home / Self Care | Payer: Medicare HMO | Source: Ambulatory Visit | Attending: Nurse Practitioner | Admitting: Nurse Practitioner

## 2018-02-02 ENCOUNTER — Inpatient Hospital Stay: Payer: Medicare HMO | Attending: Oncology | Admitting: Oncology

## 2018-02-02 DIAGNOSIS — I700 Atherosclerosis of aorta: Secondary | ICD-10-CM | POA: Insufficient documentation

## 2018-02-02 DIAGNOSIS — J439 Emphysema, unspecified: Secondary | ICD-10-CM | POA: Insufficient documentation

## 2018-02-02 DIAGNOSIS — Z87891 Personal history of nicotine dependence: Secondary | ICD-10-CM | POA: Insufficient documentation

## 2018-02-02 DIAGNOSIS — Z122 Encounter for screening for malignant neoplasm of respiratory organs: Secondary | ICD-10-CM | POA: Insufficient documentation

## 2018-02-02 DIAGNOSIS — I2510 Atherosclerotic heart disease of native coronary artery without angina pectoris: Secondary | ICD-10-CM | POA: Insufficient documentation

## 2018-02-02 DIAGNOSIS — F17210 Nicotine dependence, cigarettes, uncomplicated: Secondary | ICD-10-CM | POA: Diagnosis not present

## 2018-02-03 NOTE — Progress Notes (Signed)
In accordance with CMS guidelines, patient has met eligibility criteria including age, absence of signs or symptoms of lung cancer.  Social History   Tobacco Use  . Smoking status: Current Every Day Smoker    Packs/day: 1.50    Years: 42.00    Pack years: 63.00    Types: Cigarettes  . Smokeless tobacco: Never Used  Substance Use Topics  . Alcohol use: No    Alcohol/week: 0.0 oz    Comment: occasionally  . Drug use: No     A shared decision-making session was conducted prior to the performance of CT scan. This includes one or more decision aids, includes benefits and harms of screening, follow-up diagnostic testing, over-diagnosis, false positive rate, and total radiation exposure.  Counseling on the importance of adherence to annual lung cancer LDCT screening, impact of co-morbidities, and ability or willingness to undergo diagnosis and treatment is imperative for compliance of the program.  Counseling on the importance of continued smoking cessation for former smokers; the importance of smoking cessation for current smokers, and information about tobacco cessation interventions have been given to patient including [place] [facility] and [facility] quit [place] programs.  Written order for lung cancer screening with LDCT has been given to the patient and any and all questions have been answered to the best of my abilities.   Yearly follow up will be coordinated by Shawn Perkins, Thoracic Navigator.  Jennifer Burns, NP 02/03/2018 7:15 AM  

## 2018-02-07 ENCOUNTER — Encounter: Payer: Self-pay | Admitting: *Deleted

## 2018-02-11 DIAGNOSIS — K219 Gastro-esophageal reflux disease without esophagitis: Secondary | ICD-10-CM | POA: Diagnosis not present

## 2018-02-11 DIAGNOSIS — R490 Dysphonia: Secondary | ICD-10-CM | POA: Diagnosis not present

## 2018-02-11 DIAGNOSIS — R1314 Dysphagia, pharyngoesophageal phase: Secondary | ICD-10-CM | POA: Diagnosis not present

## 2018-03-17 ENCOUNTER — Ambulatory Visit: Payer: Medicare HMO | Admitting: Internal Medicine

## 2018-03-21 ENCOUNTER — Telehealth: Payer: Self-pay | Admitting: Internal Medicine

## 2018-03-24 NOTE — Telephone Encounter (Signed)
Patient would like the approval for gabapentin re-faxed to [facility].

## 2018-03-25 MED ORDER — GABAPENTIN 300 MG PO CAPS: ORAL_CAPSULE | ORAL | 1 refills | Status: DC

## 2018-03-25 NOTE — Addendum Note (Signed)
Addended by: Pilar Grammes on: 03/25/2018 10:37 AM   Modules accepted: Orders

## 2018-03-25 NOTE — Telephone Encounter (Signed)
Rx re-sent to [employer].  

## 2018-03-25 NOTE — Addendum Note (Signed)
Addended by: Pilar Grammes on: 03/25/2018 10:43 AM   Modules accepted: Orders

## 2018-05-02 ENCOUNTER — Other Ambulatory Visit: Payer: Self-pay | Admitting: Internal Medicine

## 2018-05-02 NOTE — Telephone Encounter (Signed)
Please set up her next AMW

## 2018-05-02 NOTE — Telephone Encounter (Signed)
Last filled 06-25-17 #270 Last OV/CPE 10-27-17 No Future OV  [employer] mail order

## 2018-06-03 ENCOUNTER — Other Ambulatory Visit: Payer: Self-pay | Admitting: Internal Medicine

## 2018-06-13 ENCOUNTER — Emergency Department: Payer: Medicare HMO

## 2018-06-13 ENCOUNTER — Encounter: Payer: Self-pay | Admitting: Emergency Medicine

## 2018-06-13 ENCOUNTER — Other Ambulatory Visit: Payer: Self-pay

## 2018-06-13 ENCOUNTER — Inpatient Hospital Stay
Admission: EM | Admit: 2018-06-13 | Discharge: 2018-06-15 | DRG: 377 | Disposition: A | Discharge status: Home / Self Care | Payer: Medicare HMO | Attending: Internal Medicine | Admitting: Internal Medicine

## 2018-06-13 DIAGNOSIS — Z720 Tobacco use: Secondary | ICD-10-CM | POA: Diagnosis not present

## 2018-06-13 DIAGNOSIS — E876 Hypokalemia: Secondary | ICD-10-CM | POA: Diagnosis not present

## 2018-06-13 DIAGNOSIS — K2210 Ulcer of esophagus without bleeding: Secondary | ICD-10-CM | POA: Diagnosis not present

## 2018-06-13 DIAGNOSIS — K2289 Other specified disease of esophagus: Secondary | ICD-10-CM

## 2018-06-13 DIAGNOSIS — K219 Gastro-esophageal reflux disease without esophagitis: Secondary | ICD-10-CM | POA: Diagnosis present

## 2018-06-13 DIAGNOSIS — J449 Chronic obstructive pulmonary disease, unspecified: Secondary | ICD-10-CM | POA: Diagnosis present

## 2018-06-13 DIAGNOSIS — K648 Other hemorrhoids: Secondary | ICD-10-CM | POA: Diagnosis present

## 2018-06-13 DIAGNOSIS — R079 Chest pain, unspecified: Secondary | ICD-10-CM | POA: Diagnosis not present

## 2018-06-13 DIAGNOSIS — E871 Hypo-osmolality and hyponatremia: Secondary | ICD-10-CM | POA: Diagnosis not present

## 2018-06-13 DIAGNOSIS — K635 Polyp of colon: Secondary | ICD-10-CM

## 2018-06-13 DIAGNOSIS — Z681 Body mass index (BMI) 19 or less, adult: Secondary | ICD-10-CM

## 2018-06-13 DIAGNOSIS — Z79890 Hormone replacement therapy (postmenopausal): Secondary | ICD-10-CM

## 2018-06-13 DIAGNOSIS — K589 Irritable bowel syndrome without diarrhea: Secondary | ICD-10-CM | POA: Diagnosis present

## 2018-06-13 DIAGNOSIS — F329 Major depressive disorder, single episode, unspecified: Secondary | ICD-10-CM | POA: Diagnosis present

## 2018-06-13 DIAGNOSIS — K922 Gastrointestinal hemorrhage, unspecified: Secondary | ICD-10-CM | POA: Diagnosis present

## 2018-06-13 DIAGNOSIS — K253 Acute gastric ulcer without hemorrhage or perforation: Secondary | ICD-10-CM

## 2018-06-13 DIAGNOSIS — F419 Anxiety disorder, unspecified: Secondary | ICD-10-CM | POA: Diagnosis present

## 2018-06-13 DIAGNOSIS — Z96611 Presence of right artificial shoulder joint: Secondary | ICD-10-CM | POA: Diagnosis present

## 2018-06-13 DIAGNOSIS — D62 Acute posthemorrhagic anemia: Secondary | ICD-10-CM

## 2018-06-13 DIAGNOSIS — F17200 Nicotine dependence, unspecified, uncomplicated: Secondary | ICD-10-CM | POA: Diagnosis not present

## 2018-06-13 DIAGNOSIS — K921 Melena: Secondary | ICD-10-CM

## 2018-06-13 DIAGNOSIS — Z96612 Presence of left artificial shoulder joint: Secondary | ICD-10-CM | POA: Diagnosis present

## 2018-06-13 DIAGNOSIS — Z79899 Other long term (current) drug therapy: Secondary | ICD-10-CM

## 2018-06-13 DIAGNOSIS — Z8711 Personal history of peptic ulcer disease: Secondary | ICD-10-CM

## 2018-06-13 DIAGNOSIS — I10 Essential (primary) hypertension: Secondary | ICD-10-CM | POA: Diagnosis present

## 2018-06-13 DIAGNOSIS — D125 Benign neoplasm of sigmoid colon: Secondary | ICD-10-CM | POA: Diagnosis not present

## 2018-06-13 DIAGNOSIS — Z981 Arthrodesis status: Secondary | ICD-10-CM | POA: Diagnosis not present

## 2018-06-13 DIAGNOSIS — D509 Iron deficiency anemia, unspecified: Secondary | ICD-10-CM | POA: Diagnosis not present

## 2018-06-13 DIAGNOSIS — E43 Unspecified severe protein-calorie malnutrition: Secondary | ICD-10-CM | POA: Diagnosis present

## 2018-06-13 DIAGNOSIS — K259 Gastric ulcer, unspecified as acute or chronic, without hemorrhage or perforation: Secondary | ICD-10-CM | POA: Diagnosis not present

## 2018-06-13 DIAGNOSIS — R51 Headache: Secondary | ICD-10-CM | POA: Diagnosis not present

## 2018-06-13 DIAGNOSIS — K254 Chronic or unspecified gastric ulcer with hemorrhage: Secondary | ICD-10-CM | POA: Diagnosis not present

## 2018-06-13 DIAGNOSIS — K644 Residual hemorrhoidal skin tags: Secondary | ICD-10-CM | POA: Diagnosis present

## 2018-06-13 DIAGNOSIS — E782 Mixed hyperlipidemia: Secondary | ICD-10-CM | POA: Diagnosis not present

## 2018-06-13 DIAGNOSIS — I700 Atherosclerosis of aorta: Secondary | ICD-10-CM | POA: Diagnosis not present

## 2018-06-13 DIAGNOSIS — Z66 Do not resuscitate: Secondary | ICD-10-CM | POA: Diagnosis not present

## 2018-06-13 DIAGNOSIS — Z885 Allergy status to narcotic agent status: Secondary | ICD-10-CM

## 2018-06-13 DIAGNOSIS — K228 Other specified diseases of esophagus: Secondary | ICD-10-CM

## 2018-06-13 DIAGNOSIS — G8929 Other chronic pain: Secondary | ICD-10-CM | POA: Diagnosis present

## 2018-06-13 DIAGNOSIS — M797 Fibromyalgia: Secondary | ICD-10-CM | POA: Diagnosis present

## 2018-06-13 DIAGNOSIS — Z888 Allergy status to other drugs, medicaments and biological substances status: Secondary | ICD-10-CM

## 2018-06-13 DIAGNOSIS — F17210 Nicotine dependence, cigarettes, uncomplicated: Secondary | ICD-10-CM | POA: Diagnosis present

## 2018-06-13 DIAGNOSIS — Z716 Tobacco abuse counseling: Secondary | ICD-10-CM | POA: Diagnosis not present

## 2018-06-13 DIAGNOSIS — D649 Anemia, unspecified: Secondary | ICD-10-CM | POA: Diagnosis not present

## 2018-06-13 DIAGNOSIS — M542 Cervicalgia: Secondary | ICD-10-CM | POA: Diagnosis not present

## 2018-06-13 LAB — BASIC METABOLIC PANEL
ANION GAP: 8 (ref 5–15)
BUN: <5 mg/dL — ABNORMAL LOW (ref 6–20)
CO2: 25 mmol/L (ref 22–32)
Calcium: 8.8 mg/dL — ABNORMAL LOW (ref 8.9–10.3)
Chloride: 95 mmol/L — ABNORMAL LOW (ref 98–111)
Creatinine, Ser: 0.73 mg/dL (ref 0.44–1.00)
GFR calc non Af Amer: >60 mL/min (ref >60)
GLUCOSE: 96 mg/dL (ref 70–99)
Potassium: 2.8 mmol/L — ABNORMAL LOW (ref 3.5–5.1)
SODIUM: 128 mmol/L — AB (ref 135–145)

## 2018-06-13 LAB — TROPONIN I: Troponin I: <0.03 ng/mL (ref <0.03)

## 2018-06-13 LAB — CBC
HCT: 23.7 % — ABNORMAL LOW (ref 35.0–47.0)
HEMOGLOBIN: 7.7 g/dL — AB (ref 12.0–16.0)
MCH: 21.6 pg — ABNORMAL LOW (ref 26.0–34.0)
MCHC: 32.5 g/dL (ref 32.0–36.0)
MCV: 66.4 fL — ABNORMAL LOW (ref 80.0–100.0)
Platelets: 464 10*3/uL — ABNORMAL HIGH (ref 150–440)
RBC: 3.57 MIL/uL — ABNORMAL LOW (ref 3.80–5.20)
RDW: 18.3 % — ABNORMAL HIGH (ref 11.5–14.5)
WBC: 6.9 10*3/uL (ref 3.6–11.0)

## 2018-06-13 LAB — HEMOGLOBIN
Hemoglobin: 7.6 g/dL — ABNORMAL LOW (ref 12.0–16.0)
Hemoglobin: 7.5 g/dL — ABNORMAL LOW (ref 12.0–16.0)

## 2018-06-13 LAB — MAGNESIUM: MAGNESIUM: 1.9 mg/dL (ref 1.7–2.4)

## 2018-06-13 MED ORDER — ALPRAZOLAM 0.5 MG PO TABS
0.5000 mg | ORAL_TABLET | Freq: Three times a day (TID) | ORAL | Status: DC | PRN
Start: 2018-06-13 — End: 2018-06-15
  Administered 2018-06-13 – 2018-06-14 (×2): 0.5 mg via ORAL
  Filled 2018-06-13 (×2): qty 1

## 2018-06-13 MED ORDER — BISACODYL 5 MG PO TBEC
5.0000 mg | DELAYED_RELEASE_TABLET | Freq: Every day | ORAL | Status: DC | PRN
  Filled 2018-06-13: qty 1

## 2018-06-13 MED ORDER — KETOROLAC TROMETHAMINE 30 MG/ML IJ SOLN
30.0000 mg | Freq: Four times a day (QID) | INTRAMUSCULAR | Status: DC | PRN
  Administered 2018-06-13: 30 mg via INTRAVENOUS
  Filled 2018-06-13: qty 1

## 2018-06-13 MED ORDER — NICOTINE 14 MG/24HR TD PT24
14.0000 mg | MEDICATED_PATCH | Freq: Every day | TRANSDERMAL | Status: DC
  Administered 2018-06-13 – 2018-06-15 (×3): 14 mg via TRANSDERMAL
  Filled 2018-06-13 (×3): qty 1

## 2018-06-13 MED ORDER — GABAPENTIN 300 MG PO CAPS
1200.0000 mg | ORAL_CAPSULE | Freq: Every day | ORAL | Status: DC
  Administered 2018-06-13 – 2018-06-14 (×2): 1200 mg via ORAL
  Filled 2018-06-13 (×2): qty 4

## 2018-06-13 MED ORDER — IOPAMIDOL (ISOVUE-370) INJECTION 76%
75.0000 mL | Freq: Once | INTRAVENOUS | Status: AC | PRN
  Administered 2018-06-13: 75 mL via INTRAVENOUS

## 2018-06-13 MED ORDER — ONDANSETRON HCL 4 MG/2ML IJ SOLN
4.0000 mg | Freq: Four times a day (QID) | INTRAMUSCULAR | Status: DC | PRN
  Administered 2018-06-13 – 2018-06-14 (×2): 4 mg via INTRAVENOUS
  Filled 2018-06-13 (×2): qty 2

## 2018-06-13 MED ORDER — MORPHINE SULFATE (PF) 4 MG/ML IV SOLN
4.0000 mg | Freq: Once | INTRAVENOUS | Status: AC
  Administered 2018-06-13: 4 mg via INTRAVENOUS
  Filled 2018-06-13: qty 1

## 2018-06-13 MED ORDER — PAROXETINE HCL 20 MG PO TABS
20.0000 mg | ORAL_TABLET | Freq: Every morning | ORAL | Status: DC
  Administered 2018-06-14 – 2018-06-15 (×2): 20 mg via ORAL
  Filled 2018-06-13 (×2): qty 1

## 2018-06-13 MED ORDER — FAMOTIDINE IN NACL 20-0.9 MG/50ML-% IV SOLN
20.0000 mg | Freq: Two times a day (BID) | INTRAVENOUS | Status: DC
  Administered 2018-06-13 – 2018-06-14 (×2): 20 mg via INTRAVENOUS
  Filled 2018-06-13 (×2): qty 50

## 2018-06-13 MED ORDER — LAMOTRIGINE 100 MG PO TABS
100.0000 mg | ORAL_TABLET | Freq: Two times a day (BID) | ORAL | Status: DC
  Administered 2018-06-13 – 2018-06-14 (×3): 100 mg via ORAL
  Filled 2018-06-13 (×3): qty 1

## 2018-06-13 MED ORDER — POTASSIUM CHLORIDE CRYS ER 20 MEQ PO TBCR
40.0000 meq | EXTENDED_RELEASE_TABLET | Freq: Two times a day (BID) | ORAL | Status: AC
  Administered 2018-06-13 – 2018-06-14 (×2): 40 meq via ORAL
  Filled 2018-06-13 (×2): qty 2

## 2018-06-13 MED ORDER — ONDANSETRON HCL 4 MG/2ML IJ SOLN
4.0000 mg | Freq: Once | INTRAMUSCULAR | Status: AC
  Administered 2018-06-13: 4 mg via INTRAVENOUS
  Filled 2018-06-13: qty 2

## 2018-06-13 MED ORDER — SENNOSIDES-DOCUSATE SODIUM 8.6-50 MG PO TABS: 1.0000 | ORAL_TABLET | Freq: Every evening | ORAL | Status: DC | PRN

## 2018-06-13 MED ORDER — OXYCODONE HCL 5 MG PO TABS
5.0000 mg | ORAL_TABLET | Freq: Once | ORAL | Status: AC
  Administered 2018-06-13: 5 mg via ORAL
  Filled 2018-06-13: qty 1

## 2018-06-13 MED ORDER — ALBUTEROL SULFATE (2.5 MG/3ML) 0.083% IN NEBU: 2.5000 mg | INHALATION_SOLUTION | RESPIRATORY_TRACT | Status: DC | PRN

## 2018-06-13 MED ORDER — ONDANSETRON HCL 4 MG PO TABS: 4.0000 mg | ORAL_TABLET | Freq: Four times a day (QID) | ORAL | Status: DC | PRN

## 2018-06-13 MED ORDER — POTASSIUM CHLORIDE IN NACL 40-0.9 MEQ/L-% IV SOLN
INTRAVENOUS | Status: DC
  Administered 2018-06-13 – 2018-06-14 (×2): 100 mL/h via INTRAVENOUS
  Filled 2018-06-13 (×5): qty 1000

## 2018-06-13 NOTE — Progress Notes (Addendum)
Patient questioned about allergy to oxycodone. Patient states she does not have nor has she had a reaction/allergy to oxycodone and would like to receive this medication as prescribed for pain management (7/10 chronic pain in her head, neck, shoulder area)  Patient also requested to have gabapentin prescribed as it is a usual home medication. See new orders.

## 2018-06-13 NOTE — ED Notes (Signed)
..  FIRST NURSE NOTE: Pt presents today post fall over a week ago. Pt hit head and has had HA and vomiting since.

## 2018-06-13 NOTE — ED Triage Notes (Signed)
Pt s/p fall from bed and hit top of head on Tuesday. Since then pt has had headache, N/V, neck pain and right arm numbness. Pt also has had chest pain that started today and has resolved. Pt s/p neck fusion, bilateral shoulder replacement approx 3-4 years ago. Pt with heart murmur and leaking valves.

## 2018-06-13 NOTE — ED Notes (Signed)
Estill Bamberg, RN given report.

## 2018-06-13 NOTE — H&P (Signed)
[facility] [place] NAME: Molly Peters    MR#:  [id number]  DATE OF BIRTH:  10/17/1961  DATE OF ADMISSION:  06/13/2018  PRIMARY CARE PHYSICIAN: Venia Carbon, MD   REQUESTING/REFERRING PHYSICIAN: Dr. Corky Downs  CHIEF COMPLAINT:   Chief Complaint  Patient presents with  . Fall   Fall, shoulder pain, chest pain and back pain today. HISTORY OF PRESENT ILLNESS:  Molly Peters  is a 57 y.o. female with a known history of multiple medical problems as below.  The patient fell by accident 1 week ago when she got out of bed.  She has some discomfort on her back since then.  But over the past 24 hours, she developed chest pain, shoulder pain and back pain.  She also complains of epigastric discomfort and nausea.  She initially denied any melena or bloody stool but she mentioned that she has melena intermittently.  She has a history of PUD.  She has been taking baclofen for osteoarthritis.  She was found low hemoglobin at 7.7, decreased from 12.1. PAST MEDICAL HISTORY:   Past Medical History:  Diagnosis Date  . Anxiety   . Cancer ([place])   . Chronic back pain 11/12/2009   Qualifier: Diagnosis of  By: Maxie Better FNP, Rosalita Levan   . COPD (chronic obstructive pulmonary disease) (HCC)    states SOB with ADLs; no O2 use; able to speak in complete sentences without SOB(11/15/2014)  . Depression   . Difficulty swallowing pills    s/p cervical fusion  . Family history of adverse reaction to anesthesia    pt's mother and sister have hx. of post-op N/V  . Fibromyalgia   . GERD (gastroesophageal reflux disease)   . History of gastric ulcer   . Hypertension    states under control with med., has been on med. x 1-2 yr.  . IBS (irritable bowel syndrome)   . Internal hemorrhoid    states has had intermittent bright red bleeding with BM (11/15/2014)  . Limited joint range of motion    neck - s/p cervical fusion  . Localized primary osteoarthritis of  right shoulder region 11/23/2014  . Osteoarthritis of left shoulder 07/05/2015  . Osteoarthritis of right shoulder 11/2014  . Seizures ([place])    last seizure 2010  . Short-term memory loss   . TMJ syndrome   . Valvular heart disease    Initial workup a number of years ago at [facility].  Echo (10/2009) showed EF 60-65%,      normal LV size, moderate aortic insufficiency with a trileaflet aortic valve and normal aortic root size.  Mild      mitral regurgitation.   . Wears dentures    lower    PAST SURGICAL HISTORY:   Past Surgical History:  Procedure Laterality Date  . ABDOMINAL HYSTERECTOMY     partial  . ANTERIOR CERVICAL DECOMP/DISCECTOMY FUSION  02/06/2011   C4-5, C5-6, C6-7  . BREAST LUMPECTOMY Bilateral 1989   x 3 - benign  . CARDIAC CATHETERIZATION  1995  . CESAREAN SECTION     x 2  . COLONOSCOPY  09/28/2008  . ESOPHAGOGASTRODUODENOSCOPY  09/28/2008  . HARDWARE REMOVAL  11/17/2006   L5-S1  . LAMINECTOMY WITH POSTERIOR LATERAL ARTHRODESIS LEVEL 3  12/06/2009   with synovial cyst resection L3-4 bilat.  Marland Kitchen LAMINECTOMY WITH POSTERIOR LATERAL ARTHRODESIS LEVEL 3  11/17/2006   L4-5  . NM MYOVIEW LTD     Lexiscan myoview (10/2009):  EF 77%, normal wall motion, normal perfusion.   Marland Kitchen SHOULDER ARTHROSCOPY WITH DEBRIDEMENT AND BICEP TENDON REPAIR Right 04/13/2014   Procedure: RIGHT SHOULDER ARTHROSCOPY WITH DEBRIDEMENT EXTENSIVE;  Surgeon: Johnny Bridge, MD;  Location: [place];  Service: Orthopedics;  Laterality: Right;  . TOTAL SHOULDER ARTHROPLASTY Right 11/23/2014   Procedure: TOTAL RIGHT SHOULDER ARTHROPLASTY;  Surgeon: Johnny Bridge, MD;  Location: [place];  Service: Orthopedics;  Laterality: Right;  . TOTAL SHOULDER ARTHROPLASTY Left 07/05/2015   Procedure: LEFT TOTAL SHOULDER REPLACEMENT;  Surgeon: Marchia Bond, MD;  Location: [place];  Service: Orthopedics;  Laterality: Left;    SOCIAL HISTORY:   Social History   Tobacco Use  .  Smoking status: Current Every Day Smoker    Packs/day: 1.50    Years: 42.00    Pack years: 63.00    Types: Cigarettes  . Smokeless tobacco: Never Used  Substance Use Topics  . Alcohol use: No    Alcohol/week: 0.0 oz    Comment: occasionally    FAMILY HISTORY:   Family History  Problem Relation Age of Onset  . Cervical cancer Mother   . Anesthesia problems Mother        post-op N/V  . Rheum arthritis Mother   . Anxiety disorder Mother   . Healthy Father   . Cancer Father        colon cancer  . Migraines Sister   . Cervical cancer Sister   . Anesthesia problems Sister        post-op N/V  . Migraines Brother   . Asthma Daughter   . Coronary artery disease Maternal Grandmother   . Hypertension Maternal Grandmother   . Diabetes Maternal Grandmother   . Coronary artery disease Paternal Grandmother   . Hypertension Paternal Grandmother   . Diabetes Paternal Grandmother     DRUG ALLERGIES:   Allergies  Allergen Reactions  . Carbamazepine Hives  . Divalproex Sodium Swelling    HAIR FALLS OUT  . Clonazepam Other (See Comments)    HALLUCINATIONS HALLUCINATIONS  . Divalproex Sodium Swelling    HAIR FALLS OUT  . Diphenhydramine Hcl Other (See Comments)    UNKNOWN  . Diphenhydramine Hcl Rash    UNKNOWN  . Tiotropium Other (See Comments)    CREATES YEAST IN THROAT  . Tiotropium Bromide Monohydrate Other (See Comments)    CREATES YEAST IN THROAT  . Vicodin [Hydrocodone-Acetaminophen] Itching    REVIEW OF SYSTEMS:   Review of Systems  Constitutional: Positive for malaise/fatigue. Negative for chills and fever.  HENT: Negative for sore throat.   Eyes: Negative for blurred vision and double vision.  Respiratory: Negative for cough, hemoptysis, shortness of breath, wheezing and stridor.   Cardiovascular: Positive for chest pain. Negative for palpitations, orthopnea and leg swelling.  Gastrointestinal: Negative for abdominal pain, blood in stool, diarrhea, melena,  nausea and vomiting.  Genitourinary: Negative for dysuria, flank pain and hematuria.  Musculoskeletal: Positive for back pain, falls and joint pain.  Skin: Negative for rash.  Neurological: Negative for dizziness, sensory change, focal weakness, seizures, loss of consciousness, weakness and headaches.  Endo/Heme/Allergies: Negative for polydipsia.  Psychiatric/Behavioral: Negative for depression. The patient is not nervous/anxious.     MEDICATIONS AT HOME:   Prior to Admission medications   Medication Sig Start Date End Date Taking? Authorizing Provider  ALPRAZolam Duanne Moron) 1 MG tablet TAKE 1 TABLET THREE TIMES DAILY AS NEEDED FOR ANXIETY 05/02/18  Yes Venia Carbon, MD  gabapentin (NEURONTIN) 300 MG capsule TAKE 1 CAPSULE TWICE DAILY AND TAKE 2 CAPSULES AT BEDTIME. Patient taking differently: Take 1,200 mg by mouth at bedtime.  03/25/18  Yes Viviana Simpler I, MD  lamoTRIgine (LAMICTAL) 100 MG tablet TAKE 1 TABLET TWICE DAILY 06/06/18  Yes Venia Carbon, MD  losartan-hydrochlorothiazide [facility]) 100-12.5 MG tablet TAKE 1 TABLET EVERY DAY 05/02/18  Yes Viviana Simpler I, MD  PARoxetine (PAXIL) 20 MG tablet TAKE 1 TABLET EVERY MORNING 09/08/17  Yes Venia Carbon, MD  albuterol (PROVENTIL HFA;VENTOLIN HFA) 108 (90 Base) MCG/ACT inhaler Inhale 1-2 puffs into the lungs every 6 (six) hours as needed for wheezing or shortness of breath. Patient not taking: Reported on 06/13/2018 05/22/16   Venia Carbon, MD  Cholecalciferol (VITAMIN D3) 2000 units capsule Take 1 capsule (2,000 Units total) by mouth daily. Patient not taking: Reported on 10/27/2017 03/15/16   Milinda Pointer, MD  estradiol (ESTRACE VAGINAL) 0.1 MG/GM vaginal cream Use 3/4 of an applicator full 3 times a week (0.5gm 3 times a week) Patient not taking: Reported on 06/13/2018 12/03/17   Venia Carbon, MD      VITAL SIGNS:  Blood pressure 123/80, pulse 74, temperature 98.2 F (36.8 C), temperature source Oral, resp. rate  20, height 5\' 1"  (1.549 m), weight 95 lb (43.1 kg), SpO2 97 %.  PHYSICAL EXAMINATION:  Physical Exam  GENERAL:  57 y.o.-year-old patient lying in the bed with no acute distress.  Severe malnutrition. EYES: Pupils equal, round, reactive to light and accommodation. No scleral icterus. Extraocular muscles intact.  HEENT: Head atraumatic, normocephalic. Oropharynx and nasopharynx clear.  NECK:  Supple, no jugular venous distention. No thyroid enlargement, no tenderness.  LUNGS: Normal breath sounds bilaterally, no wheezing, rales,rhonchi or crepitation. No use of accessory muscles of respiration.  CARDIOVASCULAR: S1, S2 normal. No murmurs, rubs, or gallops.  ABDOMEN: Soft, tenderness in epigastric area, nondistended. Bowel sounds present. No organomegaly or mass.  EXTREMITIES: No pedal edema, cyanosis, or clubbing.  NEUROLOGIC: Cranial nerves II through XII are intact. Muscle strength 5/5 in all extremities. Sensation intact. Gait not checked.  PSYCHIATRIC: The patient is alert and oriented x 3.  SKIN: No obvious rash, lesion, or ulcer.   LABORATORY PANEL:   CBC Recent Labs  Lab 06/13/18 1337  WBC 6.9  HGB 7.7*  HCT 23.7*  PLT 464*   ------------------------------------------------------------------------------------------------------------------  Chemistries  Recent Labs  Lab 06/13/18 1337  NA 128*  K 2.8*  CL 95*  CO2 25  GLUCOSE 96  BUN <5*  CREATININE 0.73  CALCIUM 8.8*   ------------------------------------------------------------------------------------------------------------------  Cardiac Enzymes Recent Labs  Lab 06/13/18 1337  TROPONINI <0.03   ------------------------------------------------------------------------------------------------------------------  RADIOLOGY:  Dg Cervical Spine Complete  Result Date: 06/13/2018 CLINICAL DATA:  Neck pain and right arm numbness. [place] out of bed last week. Prior cervical fusion. EXAM: CERVICAL SPINE - COMPLETE 4+  VIEW COMPARISON:  Cervical spine radiographs 04/25/2015. Soft tissue neck CT 12/06/2017. FINDINGS: Grade 1 anterolisthesis of C3 on C4 and C7 on T1 is unchanged from the prior radiographs. Sequelae of C4-C7 ACDF are again identified with unchanged appearance of the anterior fusion plate and screws. Bulky anterior vertebral osteophyte formation at C3-4 is unchanged. Mild C3-4 disc space narrowing is new from 2016. C7-T1 disc space narrowing has not significantly changed. Moderate to severe facet arthrosis is noted at C2-3, C3-4, and C7-T1. Multilevel osseous neural foraminal stenosis is again seen due to uncovertebral spurring and facet arthrosis, less well evaluated than on  the prior radiographs due to suboptimal positioning though with likely severe right-sided neural foraminal stenosis at C6-7. No acute fracture is identified. The prevertebral soft tissues are within normal limits. The visualized lung apices are clear. IMPRESSION: 1. No acute osseous abnormality identified. 2. Stable appearance of C4-C7 ACDF. 3. Progressive C3-4 disc space narrowing from 2016. 4. Moderate to severe multilevel facet arthrosis. Electronically Signed   By: Logan Bores M.D.   On: 06/13/2018 14:35   Ct Head Wo Contrast  Result Date: 06/13/2018 CLINICAL DATA:  [place] off of couch 2 weeks ago. Pain. Headache. More recent fall, hitting top of head. EXAM: CT HEAD WITHOUT CONTRAST TECHNIQUE: Contiguous axial images were obtained from the base of the skull through the vertex without intravenous contrast. COMPARISON:  CT head 01/11/2018.  MR head 03/20/2009. FINDINGS: Brain: No evidence for acute infarction, hemorrhage, mass lesion, hydrocephalus, or extra-axial fluid. Premature for age atrophy. Extensive hypoattenuation throughout the white matter, consistent with small vessel disease, also premature for age. Remote LEFT basal ganglia lacunar infarct, was chronic in 2010. Vascular: Calcification of the cavernous internal carotid arteries  consistent with cerebrovascular atherosclerotic disease. No signs of intracranial large vessel occlusion. Skull: Calvarium intact. Sinuses/Orbits: Clear sinuses.  No orbital findings of significance. Other: Advanced LEFT TMJ arthritis. Compared to priors, similar appearance. Previously noted scalp hematoma is improved. IMPRESSION: Atrophy and small vessel disease. Evidence for remote LEFT basal ganglia infarct. No acute intracranial findings. Similar appearance to 01/11/2018. No posttraumatic sequelae are evident. Electronically Signed   By: Staci Righter M.D.   On: 06/13/2018 14:07   Ct Angio Chest/abd/pel For Dissection W And/or Wo Contrast  Result Date: 06/13/2018 CLINICAL DATA:  Chest pain EXAM: CT ANGIOGRAPHY CHEST, ABDOMEN AND PELVIS TECHNIQUE: Multidetector CT imaging through the chest, abdomen and pelvis was performed using the standard protocol during bolus administration of intravenous contrast. Multiplanar reconstructed images and MIPs were obtained and reviewed to evaluate the vascular anatomy. CONTRAST:  110mL ISOVUE-370 IOPAMIDOL (ISOVUE-370) INJECTION 76% COMPARISON:  02/02/2018.  06/26/2011. FINDINGS: CTA CHEST FINDINGS Cardiovascular: Atherosclerotic calcifications of the thoracic aorta are noted. This includes the aortic arch. Moderate 3 vessel coronary artery calcification. There is no evidence of acute intramural hematoma on noncontrast images. There is no evidence of aortic dissection or aneurysm. The thoracic aorta is patent with scattered atherosclerotic calcifications and smooth plaque in the descending thoracic aorta. Great vessels are patent within the confines of the examination. Vertebral arteries are also patent. Mediastinum/Nodes: No abnormal mediastinal adenopathy. Thyroid is atrophic. No pericardial effusion. Lungs/Pleura: Advanced emphysema towards the lung apices which is panlobular. Stable 3 mm left lower lobe pulmonary nodule on image 98 of series 7. Stable 3 mm left upper lobe  pulmonary nodule on image 60. Musculoskeletal: No vertebral compression deformity. Right shoulder arthroplasty is in place. No evidence of acute rib fracture. No destructive bone lesion. Review of the MIP images confirms the above findings. CTA ABDOMEN AND PELVIS FINDINGS VASCULAR Aorta: Nonaneurysmal and patent with scattered atherosclerotic calcifications and soft plaque. No evidence of aortic dissection. Celiac: Patent. SMA: Patent. Renals: 2 right renal arteries are patent. There are atherosclerotic changes at the origin of the left renal artery. Exact degree of narrowing is difficult. IMA: Diminutive and patent. Inflow: There is smooth soft plaque as well as atherosclerotic calcified plaque in both common iliac arteries without aneurysmal dilatation, significant narrowing, or dissection. Scattered atherosclerotic changes of the bilateral internal and external iliac arteries are also present without evidence of significant narrowing or dissection.  Review of the MIP images confirms the above findings. NON-VASCULAR Hepatobiliary: Gallbladder and liver are within normal limits for arterial phase imaging. Pancreas: Unremarkable Spleen: Unremarkable Adrenals/Urinary Tract: Prominence of the adrenal glands in a hyperplasia pattern is stable. Kidneys are within normal limits bilaterally. Stomach/Bowel: Stomach and duodenum are within normal limits. There is no dilatation of bowel to suggest obstruction. No obvious mass in the visualized colon. Lymphatic: No abnormal retroperitoneal adenopathy Reproductive: Uterus is absent.  Adnexa are unremarkable. Other: No free fluid. Musculoskeletal: No vertebral compression deformity. L3 through S1 fusion hardware is in place. Review of the MIP images confirms the above findings. IMPRESSION: Vascular: No evidence of aortic dissection or intramural hematoma. The abdominal aorta is nonaneurysmal and patent. Nonvascular: Stable 3 mm pulmonary nodules in the left lung supporting  benign etiology. No acute intra thoracic or intra-abdominal pathology. Aortic Atherosclerosis (ICD10-I70.0) and Emphysema (ICD10-J43.9). Electronically Signed   By: Marybelle Killings M.D.   On: 06/13/2018 15:32      IMPRESSION AND PLAN:   GI bleeding. Patient will be admitted to medical floor, follow-up hemoglobin every 6 hours, Protonix IV twice daily, clear liquid diet and n.p.o. after midnight for possible EGD.  GI consult.  Anemia.  Possible due to GI bleeding.  Follow-up hemoglobin every 6 hours, PRBC transfusion PRN.  Hyponatremia.  Hold HCTZ and start normal saline IV, follow-up BMP.  Hypokalemia.  Give potassium supplement both p.o. and IV, follow-up potassium and magnesium level.  COPD.  Stable.  Nebulizer as needed Tobacco abuse.  Smoking cessation was counseled for 3 to 4 minutes, nicotine patch.  Severe malnutrition.  Dietitian consult.  All the records are reviewed and case discussed with ED provider. Management plans discussed with the patient, her husband and they are in agreement.  CODE STATUS: Full code  TOTAL TIME TAKING CARE OF THIS PATIENT: 50 minutes.    Demetrios Loll M.D on 06/13/2018 at 4:51 PM  Between 7am to 6pm - Pager - [phone]  After 6pm go to [email or web address] - Proofreader  [facility]  Office  [phone]  CC: Primary care physician; Venia Carbon, MD   Note: This dictation was prepared with Dragon dictation along with smaller phrase technology. Any transcriptional errors that result from this process are unin

## 2018-06-13 NOTE — ED Provider Notes (Signed)
[facility] Department Provider Note   ____________________________________________    I have reviewed the triage vital signs and the nursing notes.   HISTORY  Chief Complaint Fall     HPI Molly Peters is a 57 y.o. female who presents with complaints of chest pain, back pain.  Patient reports that she had a fall 1 week ago out of bed which was not particularly severe however she has had some discomfort in her back since then.  Over the last 24 hours however she is developed chest pain with pain in her shoulder blades as well.  She has never had this before.  She also complains of nausea and some epigastric discomfort.  Denies dark stools.  Did have some diarrhea several days ago.   Past Medical History:  Diagnosis Date  . Anxiety   . Cancer ([place])   . Chronic back pain 11/12/2009   Qualifier: Diagnosis of  By: Maxie Better FNP, Rosalita Levan   . COPD (chronic obstructive pulmonary disease) (HCC)    states SOB with ADLs; no O2 use; able to speak in complete sentences without SOB(11/15/2014)  . Depression   . Difficulty swallowing pills    s/p cervical fusion  . Family history of adverse reaction to anesthesia    pt's mother and sister have hx. of post-op N/V  . Fibromyalgia   . GERD (gastroesophageal reflux disease)   . History of gastric ulcer   . Hypertension    states under control with med., has been on med. x 1-2 yr.  . IBS (irritable bowel syndrome)   . Internal hemorrhoid    states has had intermittent bright red bleeding with BM (11/15/2014)  . Limited joint range of motion    neck - s/p cervical fusion  . Localized primary osteoarthritis of right shoulder region 11/23/2014  . Osteoarthritis of left shoulder 07/05/2015  . Osteoarthritis of right shoulder 11/2014  . Seizures ([place])    last seizure 2010  . Short-term memory loss   . TMJ syndrome   . Valvular heart disease    Initial workup a number of years ago at [facility].  Echo  (10/2009) showed EF 60-65%,      normal LV size, moderate aortic insufficiency with a trileaflet aortic valve and normal aortic root size.  Mild      mitral regurgitation.   . Wears dentures    lower    Patient Active Problem List   Diagnosis Date Noted  . Hoarseness 10/27/2017  . Advance directive discussed with patient 10/27/2017  . Neuropathy 08/11/2016  . Vitamin D deficiency 03/15/2016  . 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 Hx of cervical spine surgery (Left ACDF) 02/02/2016  . Chronic pain 01/29/2016  . Marijuana use 01/29/2016  . Osteoarthritis of shoulder (Left) 07/05/2015  . S/P Bilateral Total Shoulder Replacement (2016) 11/23/2014  . Osteoarthritis of shoulder (Right) 04/13/2014  . Routine general medical examination at a health care facility 04/05/2013  . CAROTID BRUIT, LEFT 01/02/2011  . Restless legs syndrome (RLS) 10/21/2010  . Essential hypertension, benign 06/23/2010  . HYPERLIPIDEMIA-MIXED 10/23/2009  . Valvular heart disease 03/27/2009  . IRRITABLE BOWEL SYNDROME 11/13/2008  . Mood disorder ([place]) 09/07/2007  . Tobacco use disorder 09/07/2007  . GLAUCOMA 09/07/2007  . HEMORRHOIDS 09/07/2007  . GASTRIC ULCER 09/07/2007  . COPD (chronic obstructive pulmonary disease) with chronic bronchitis ([place]) 08/08/2007  . GERD 08/08/2007  . Seizure disorder ([place]) 08/08/2007  Past Surgical History:  Procedure Laterality Date  . ABDOMINAL HYSTERECTOMY     partial  . ANTERIOR CERVICAL DECOMP/DISCECTOMY FUSION  02/06/2011   C4-5, C5-6, C6-7  . BREAST LUMPECTOMY Bilateral 1989   x 3 - benign  . CARDIAC CATHETERIZATION  1995  . CESAREAN SECTION     x 2  . COLONOSCOPY  09/28/2008  . ESOPHAGOGASTRODUODENOSCOPY  09/28/2008  . HARDWARE REMOVAL  11/17/2006   L5-S1  . LAMINECTOMY WITH POSTERIOR LATERAL ARTHRODESIS LEVEL 3  12/06/2009   with synovial cyst resection L3-4 bilat.  Marland Kitchen LAMINECTOMY WITH POSTERIOR LATERAL ARTHRODESIS LEVEL  3  11/17/2006   L4-5  . NM MYOVIEW LTD     Lexiscan myoview (10/2009): EF 77%, normal wall motion, normal perfusion.   Marland Kitchen SHOULDER ARTHROSCOPY WITH DEBRIDEMENT AND BICEP TENDON REPAIR Right 04/13/2014   Procedure: RIGHT SHOULDER ARTHROSCOPY WITH DEBRIDEMENT EXTENSIVE;  Surgeon: Johnny Bridge, MD;  Location: [facility];  Service: Orthopedics;  Laterality: Right;  . TOTAL SHOULDER ARTHROPLASTY Right 11/23/2014   Procedure: TOTAL RIGHT SHOULDER ARTHROPLASTY;  Surgeon: Johnny Bridge, MD;  Location: [facility];  Service: Orthopedics;  Laterality: Right;  . TOTAL SHOULDER ARTHROPLASTY Left 07/05/2015   Procedure: LEFT TOTAL SHOULDER REPLACEMENT;  Surgeon: Marchia Bond, MD;  Location: [facility];  Service: Orthopedics;  Laterality: Left;    Prior to Admission medications   Medication Sig Start Date End Date Taking? Authorizing Provider  albuterol (PROVENTIL HFA;VENTOLIN HFA) 108 (90 Base) MCG/ACT inhaler Inhale 1-2 puffs into the lungs every 6 (six) hours as needed for wheezing or shortness of breath. 05/22/16   Venia Carbon, MD  ALPRAZolam Duanne Moron) 1 MG tablet TAKE 1 TABLET THREE TIMES DAILY AS NEEDED FOR ANXIETY 05/02/18   Venia Carbon, MD  baclofen (LIORESAL) 10 MG tablet Take 1 tablet (10 mg total) by mouth 3 (three) times daily. As needed for muscle spasm 01/01/16   Venia Carbon, MD  Cholecalciferol (VITAMIN D3) 2000 units capsule Take 1 capsule (2,000 Units total) by mouth daily. Patient not taking: Reported on 10/27/2017 03/15/16   Milinda Pointer, MD  estradiol (ESTRACE VAGINAL) 0.1 MG/GM vaginal cream Use 3/4 of an applicator full 3 times a week (0.5gm 3 times a week) 12/03/17   Venia Carbon, MD  gabapentin (NEURONTIN) 300 MG capsule TAKE 1 CAPSULE TWICE DAILY AND TAKE 2 CAPSULES AT BEDTIME. 03/25/18   Venia Carbon, MD  hydrOXYzine (ATARAX/VISTARIL) 50 MG tablet Take 1 tablet (50 mg total) by mouth 3 (three) times daily as needed  for itching. 01/01/16   Venia Carbon, MD  lamoTRIgine (LAMICTAL) 100 MG tablet TAKE 1 TABLET TWICE DAILY 06/06/18   Venia Carbon, MD  losartan-hydrochlorothiazide [facility]) 100-12.5 MG tablet TAKE 1 TABLET EVERY DAY 05/02/18   Venia Carbon, MD  pantoprazole (PROTONIX) 40 MG tablet Take 1 tablet (40 mg total) by mouth daily. 06/08/16   Venia Carbon, MD  PARoxetine (PAXIL) 20 MG tablet TAKE 1 TABLET EVERY MORNING 09/08/17   Viviana Simpler I, MD  potassium chloride (K-DUR) 10 MEQ tablet Take 10 mEq by mouth 2 (two) times daily. Reported on 04/20/2016    [provider]     Allergies Carbamazepine; Divalproex sodium; Clonazepam; Divalproex sodium; Diphenhydramine hcl; Diphenhydramine hcl; Tiotropium; Tiotropium bromide monohydrate; and Vicodin [hydrocodone-acetaminophen]  Family History  Problem Relation Age of Onset  . Cervical cancer Mother   . Anesthesia problems Mother  post-op N/V  . Rheum arthritis Mother   . Anxiety disorder Mother   . Healthy Father   . Cancer Father        colon cancer  . Migraines Sister   . Cervical cancer Sister   . Anesthesia problems Sister        post-op N/V  . Migraines Brother   . Asthma Daughter   . Coronary artery disease Maternal Grandmother   . Hypertension Maternal Grandmother   . Diabetes Maternal Grandmother   . Coronary artery disease Paternal Grandmother   . Hypertension Paternal Grandmother   . Diabetes Paternal Grandmother     Social History Social History   Tobacco Use  . Smoking status: Current Every Day Smoker    Packs/day: 1.50    Years: 42.00    Pack years: 63.00    Types: Cigarettes  . Smokeless tobacco: Never Used  Substance Use Topics  . Alcohol use: No    Alcohol/week: 0.0 oz    Comment: occasionally  . Drug use: No    Review of Systems  Constitutional: No fever/chills Eyes: No visual changes.  ENT: No sore throat. Cardiovascular: As above Respiratory: Denies shortness of  breath. Gastrointestinal: As above Genitourinary: Negative for dysuria. Musculoskeletal: Negative for back pain. Skin: Negative for rash. Neurological: Negative for headaches    ____________________________________________   PHYSICAL EXAM:  VITAL SIGNS: ED Triage Vitals  Enc Vitals Group     BP 06/13/18 1315 125/83     Pulse Rate 06/13/18 1315 82     Resp 06/13/18 1315 18     Temp 06/13/18 1315 99.7 F (37.6 C)     Temp Source 06/13/18 1315 Oral     SpO2 06/13/18 1315 99 %     Weight 06/13/18 1315 43.1 kg (95 lb)     Height 06/13/18 1315 1.549 m (5\' 1" )     Head Circumference --      Peak Flow --      Pain Score 06/13/18 1330 8     Pain Loc --      Pain Edu? --      Excl. in [place]? --     Constitutional: Alert and oriented. No acute distress. Pleasant and interactive  Nose: No congestion/rhinnorhea. Mouth/Throat: Mucous membranes are moist.   Neck:  Painless ROM, no vertebral tenderness to palpation Cardiovascular: Normal rate, regular rhythm. Grossly normal heart sounds.  Good peripheral circulation. Respiratory: Normal respiratory effort.  No retractions. Lungs CTAB. Gastrointestinal: Mild tenderness in the epigastrium, no distention, no stool in the rectal vault for guaiac test  Musculoskeletal: No lower extremity tenderness nor edema.  Warm and well perfused Neurologic:  Normal speech and language. No gross focal neurologic deficits are appreciated.  Skin:  Skin is warm, dry and intact. No rash noted. Psychiatric: Mood and affect are normal. Speech and behavior are normal.  ____________________________________________   LABS (all labs ordered are listed, but only abnormal results are displayed)  Labs Reviewed  BASIC METABOLIC PANEL - Abnormal; Notable for the following components:      Result Value   Sodium 128 (*)    Potassium 2.8 (*)    Chloride 95 (*)    BUN <5 (*)    Calcium 8.8 (*)    All other components within normal limits  CBC - Abnormal; Notable  for the following components:   RBC 3.57 (*)    Hemoglobin 7.7 (*)    HCT 23.7 (*)    MCV 66.4 (*)  MCH 21.6 (*)    RDW 18.3 (*)    Platelets 464 (*)    All other components within normal limits  TROPONIN I   ____________________________________________  EKG  ED ECG REPORT I, Lavonia Drafts, the attending physician, personally viewed and interpreted this ECG.  Date: 06/13/2018  Rhythm: normal sinus rhythm QRS Axis: normal Intervals: normal ST/T Wave abnormalities: normal Narrative Interpretation: no evidence of acute ischemia  ____________________________________________  RADIOLOGY  CT angiography of the chest ____________________________________________   PROCEDURES  Procedure(s) performed: No  Procedures   Critical Care performed: No ____________________________________________   INITIAL IMPRESSION / ASSESSMENT AND PLAN / ED COURSE  Pertinent labs & imaging results that were available during my care of the patient were reviewed by me and considered in my medical decision making (see chart for details).  Patient presents with new onset of chest pain along with pain in her back, this may be musculoskeletal pain from her fall although this was a week ago and it was a relatively minor mechanism  Lab work is reassuring, EKG is unremarkable.  Will send for CT angiography of her chest abdomen pelvis, given significant drop in hemoglobin.  This may be related to GI bleed but no stool in the vault for guaiac testing  No evidence of active bleeding will admit to the hospital service to trend hemoglobin, and cardiac enzymes as well as for additional work-up    ____________________________________________   FINAL CLINICAL IMPRESSION(S) / ED DIAGNOSES  Final diagnoses:  Chest pain, unspecified type  Upper GI bleed        Note:  This document was prepared using Dragon voice recognition software and may include unintentional dictation errors.    Lavonia Drafts, MD 06/13/18 [phone]

## 2018-06-14 ENCOUNTER — Encounter
Admission: EM | Disposition: A | Discharge status: Home / Self Care | Payer: Self-pay | Source: Home / Self Care | Attending: Internal Medicine

## 2018-06-14 ENCOUNTER — Inpatient Hospital Stay: Payer: Medicare HMO | Admitting: Anesthesiology

## 2018-06-14 ENCOUNTER — Encounter: Payer: Self-pay | Admitting: Anesthesiology

## 2018-06-14 DIAGNOSIS — D62 Acute posthemorrhagic anemia: Secondary | ICD-10-CM

## 2018-06-14 DIAGNOSIS — K2289 Other specified disease of esophagus: Secondary | ICD-10-CM

## 2018-06-14 DIAGNOSIS — K228 Other specified diseases of esophagus: Secondary | ICD-10-CM

## 2018-06-14 DIAGNOSIS — K921 Melena: Secondary | ICD-10-CM

## 2018-06-14 DIAGNOSIS — K922 Gastrointestinal hemorrhage, unspecified: Secondary | ICD-10-CM

## 2018-06-14 DIAGNOSIS — K253 Acute gastric ulcer without hemorrhage or perforation: Secondary | ICD-10-CM

## 2018-06-14 HISTORY — PX: ESOPHAGOGASTRODUODENOSCOPY: SHX5428

## 2018-06-14 LAB — ABO/RH: ABO/RH(D): B POS

## 2018-06-14 LAB — BASIC METABOLIC PANEL
ANION GAP: 3 — AB (ref 5–15)
BUN: 5 mg/dL — ABNORMAL LOW (ref 6–20)
CHLORIDE: 106 mmol/L (ref 98–111)
CO2: 28 mmol/L (ref 22–32)
Calcium: 8.7 mg/dL — ABNORMAL LOW (ref 8.9–10.3)
Creatinine, Ser: 1.05 mg/dL — ABNORMAL HIGH (ref 0.44–1.00)
GFR calc non Af Amer: 57 mL/min — ABNORMAL LOW (ref >60)
Glucose, Bld: 91 mg/dL (ref 70–99)
POTASSIUM: 4.7 mmol/L (ref 3.5–5.1)
Sodium: 137 mmol/L (ref 135–145)

## 2018-06-14 LAB — HIV ANTIBODY (ROUTINE TESTING W REFLEX): HIV SCREEN 4TH GENERATION: NONREACTIVE

## 2018-06-14 LAB — HEMOGLOBIN: HEMOGLOBIN: 6.9 g/dL — AB (ref 12.0–16.0)

## 2018-06-14 LAB — PREPARE RBC (CROSSMATCH)

## 2018-06-14 SURGERY — EGD (ESOPHAGOGASTRODUODENOSCOPY)
Anesthesia: General

## 2018-06-14 MED ORDER — SODIUM CHLORIDE 0.9 % IV SOLN
INTRAVENOUS | Status: DC
  Administered 2018-06-14: 13:00:00 via INTRAVENOUS

## 2018-06-14 MED ORDER — PROPOFOL 500 MG/50ML IV EMUL
INTRAVENOUS | Status: DC | PRN
  Administered 2018-06-14: 150 ug/kg/min via INTRAVENOUS

## 2018-06-14 MED ORDER — PEG 3350-KCL-NA BICARB-NACL 420 G PO SOLR
4000.0000 mL | Freq: Once | ORAL | Status: AC
  Administered 2018-06-14: 4000 mL via ORAL
  Filled 2018-06-14: qty 4000

## 2018-06-14 MED ORDER — FENTANYL CITRATE (PF) 100 MCG/2ML IJ SOLN
INTRAMUSCULAR | Status: AC
  Filled 2018-06-14: qty 2

## 2018-06-14 MED ORDER — BISACODYL 5 MG PO TBEC
10.0000 mg | DELAYED_RELEASE_TABLET | Freq: Once | ORAL | Status: AC
  Administered 2018-06-14: 10 mg via ORAL
  Filled 2018-06-14 (×2): qty 2

## 2018-06-14 MED ORDER — LIDOCAINE 2% (20 MG/ML) 5 ML SYRINGE
INTRAMUSCULAR | Status: DC | PRN
  Administered 2018-06-14: 30 mg via INTRAVENOUS

## 2018-06-14 MED ORDER — HYDRALAZINE HCL 20 MG/ML IJ SOLN
10.0000 mg | Freq: Four times a day (QID) | INTRAMUSCULAR | Status: DC | PRN
Start: 2018-06-14 — End: 2018-06-15

## 2018-06-14 MED ORDER — SODIUM CHLORIDE 0.9 % IV SOLN: INTRAVENOUS | Status: DC

## 2018-06-14 MED ORDER — PANTOPRAZOLE SODIUM 40 MG IV SOLR
40.0000 mg | Freq: Two times a day (BID) | INTRAVENOUS | Status: DC
  Administered 2018-06-14 (×2): 40 mg via INTRAVENOUS
  Filled 2018-06-14 (×2): qty 40

## 2018-06-14 MED ORDER — SODIUM CHLORIDE 0.9 % IV SOLN
INTRAVENOUS | Status: DC
  Administered 2018-06-14 – 2018-06-15 (×2): via INTRAVENOUS
  Filled 2018-06-14 (×4): qty 1000

## 2018-06-14 MED ORDER — FENTANYL CITRATE (PF) 100 MCG/2ML IJ SOLN
INTRAMUSCULAR | Status: DC | PRN
  Administered 2018-06-14 (×2): 50 ug via INTRAVENOUS

## 2018-06-14 MED ORDER — SODIUM CHLORIDE 0.9% IV SOLUTION
Freq: Once | INTRAVENOUS | Status: AC
  Administered 2018-06-14: 09:00:00 via INTRAVENOUS

## 2018-06-14 MED ORDER — PROMETHAZINE HCL 25 MG PO TABS
12.5000 mg | ORAL_TABLET | Freq: Three times a day (TID) | ORAL | Status: DC | PRN
  Administered 2018-06-14: 12.5 mg via ORAL
  Filled 2018-06-14 (×3): qty 1

## 2018-06-14 MED ORDER — PROPOFOL 500 MG/50ML IV EMUL
INTRAVENOUS | Status: AC
  Filled 2018-06-14: qty 50

## 2018-06-14 MED ORDER — TOPIRAMATE 25 MG PO TABS
50.0000 mg | ORAL_TABLET | Freq: Two times a day (BID) | ORAL | Status: DC | PRN
  Administered 2018-06-14: 50 mg via ORAL
  Filled 2018-06-14 (×3): qty 2

## 2018-06-14 MED ORDER — HYDROCHLOROTHIAZIDE 12.5 MG PO CAPS
12.5000 mg | ORAL_CAPSULE | Freq: Every day | ORAL | Status: DC
  Administered 2018-06-14 – 2018-06-15 (×2): 12.5 mg via ORAL
  Filled 2018-06-14 (×2): qty 1

## 2018-06-14 MED ORDER — PROPOFOL 10 MG/ML IV BOLUS
INTRAVENOUS | Status: DC | PRN
  Administered 2018-06-14: 100 mg via INTRAVENOUS

## 2018-06-14 MED ORDER — LOSARTAN POTASSIUM-HCTZ 100-12.5 MG PO TABS: 1.0000 | ORAL_TABLET | Freq: Every day | ORAL | Status: DC

## 2018-06-14 MED ORDER — LOSARTAN POTASSIUM 50 MG PO TABS
100.0000 mg | ORAL_TABLET | Freq: Every day | ORAL | Status: DC
  Administered 2018-06-14 – 2018-06-15 (×2): 100 mg via ORAL
  Filled 2018-06-14 (×2): qty 2

## 2018-06-14 NOTE — Progress Notes (Signed)
Paged hospitalist re: hgb 6.9  See new orders.

## 2018-06-14 NOTE — Anesthesia Preprocedure Evaluation (Signed)
Anesthesia Evaluation  Patient identified by MRN, date of birth, ID band Patient awake    Reviewed: Allergy & Precautions, H&P , NPO status , Patient's Chart, lab work & pertinent test results, reviewed documented beta blocker date and time   Airway Mallampati: III  TM Distance: >3 FB Neck ROM: full    Dental  (+) Dental Advidsory Given, Lower Dentures, Missing, Poor Dentition, Chipped   Pulmonary shortness of breath and with exertion, neg sleep apnea, COPD,  COPD inhaler, neg recent URI, Current Smoker,           Cardiovascular Exercise Tolerance: Good hypertension, (-) angina(-) CAD, (-) Past MI, (-) Cardiac Stents and (-) CABG (-) dysrhythmias + Valvular Problems/Murmurs      Neuro/Psych Seizures -, Well Controlled,  PSYCHIATRIC DISORDERS Anxiety Depression  Neuromuscular disease (fibromylagia) negative neurological ROS     GI/Hepatic Neg liver ROS, PUD, GERD  ,  Endo/Other  negative endocrine ROS  Renal/GU negative Renal ROS  negative genitourinary   Musculoskeletal   Abdominal   Peds  Hematology negative hematology ROS (+)   Anesthesia Other Findings Past Medical History: No date: Anxiety No date: Cancer ([place]) 11/12/2009: Chronic back pain     Comment:  Qualifier: Diagnosis of  By: Maxie Better FNP, Rosalita Levan  No date: COPD (chronic obstructive pulmonary disease) ([place])     Comment:  states SOB with ADLs; no O2 use; able to speak in               complete sentences without SOB(11/15/2014) No date: Depression No date: Difficulty swallowing pills     Comment:  s/p cervical fusion No date: Family history of adverse reaction to anesthesia     Comment:  pt's mother and sister have hx. of post-op N/V No date: Fibromyalgia No date: GERD (gastroesophageal reflux disease) No date: History of gastric ulcer No date: Hypertension     Comment:  states under control with med., has been on med. x 1-2        yr. No date: IBS (irritable bowel syndrome) No date: Internal hemorrhoid     Comment:  states has had intermittent bright red bleeding with BM               (11/15/2014) No date: Limited joint range of motion     Comment:  neck - s/p cervical fusion 11/23/2014: Localized primary osteoarthritis of right shoulder region 07/05/2015: Osteoarthritis of left shoulder 11/2014: Osteoarthritis of right shoulder No date: Seizures ([place])     Comment:  last seizure 2010 No date: Short-term memory loss No date: TMJ syndrome No date: Valvular heart disease     Comment:  Initial workup a number of years ago at [facility].  Echo               (10/2009) showed EF 60-65%,      normal LV size, moderate              aortic insufficiency with a trileaflet aortic valve and               normal aortic root size.  Mild      mitral regurgitation. No date: Wears dentures     Comment:  lower   Reproductive/Obstetrics negative OB ROS  Anesthesia Physical Anesthesia Plan  ASA: III  Anesthesia Plan: General   Post-op Pain Management:    Induction: Intravenous  PONV Risk Score and Plan: 2 and Propofol infusion and TIVA  Airway Management Planned: Nasal Cannula  Additional Equipment:   Intra-op Plan:   Post-operative Plan:   Informed Consent: I have reviewed the patients History and Physical, chart, labs and discussed the procedure including the risks, benefits and alternatives for the proposed anesthesia with the patient or authorized representative who has indicated his/her understanding and acceptance.   Dental Advisory Given  Plan Discussed with: Anesthesiologist, CRNA and Surgeon  Anesthesia Plan Comments:         Anesthesia Quick Evaluation

## 2018-06-14 NOTE — Plan of Care (Signed)
  Problem: Education: Goal: Knowledge of General Education information will improve Description Including pain rating scale, medication(s)/side effects and non-pharmacologic comfort measures Outcome: Progressing   Problem: Health Behavior/Discharge Planning: Goal: Ability to manage health-related needs will improve Outcome: Progressing   Problem: Coping: Goal: Level of anxiety will decrease Outcome: Progressing   Problem: Elimination: Goal: Will not experience complications related to urinary retention Outcome: Progressing   Problem: Pain Managment: Goal: General experience of comfort will improve Outcome: Progressing   Problem: Safety: Goal: Ability to remain free from injury will improve Outcome: Progressing

## 2018-06-14 NOTE — Progress Notes (Signed)
Chaplain received an OR to update or complete an advanced directive. Upon entering patient's room, chaplain found the patient to be sitting up in her bed, alert, awake and engaging in conversation. Patient was familiar with the advanced directive, so packet let for her review. Patient will ask nurse to call/page when needed.    06/14/18 1000  Clinical Encounter Type  Visited With Patient and family together  Visit Type Initial

## 2018-06-14 NOTE — Op Note (Signed)
[facility] Patient Name: Molly Peters Procedure Date: 06/14/2018 12:22 PM MRN: [id number] Account #: [id number] Date of Birth: January 20, 1961 Admit Type: Inpatient Age: 57 Room: [facility] ENDO ROOM 1 Gender: Female Note Status: Finalized Procedure:            Upper GI endoscopy Indications:          Melena, Acute post hemorrhagic anemia Providers:            Tramane Gorum B. Bonna Gains MD, MD Medicines:            Monitored Anesthesia Care Complications:        No immediate complications. Procedure:            Pre-Anesthesia Assessment:                       - The risks and benefits of the procedure and the                        sedation options and risks were discussed with the                        patient. All questions were answered and informed                        consent was obtained.                       - Patient identification and proposed procedure were                        verified prior to the procedure.                       - ASA Grade Assessment: III - A patient with severe                        systemic disease.                       After obtaining informed consent, the endoscope was                        passed under direct vision. Throughout the procedure,                        the patient's blood pressure, pulse, and oxygen                        saturations were monitored continuously. The Endoscope                        was introduced through the mouth, and advanced to the                        second part of duodenum. The upper GI endoscopy was                        accomplished with ease. The patient tolerated the                        procedure well. Findings:  The esophagus and gastroesophageal junction were examined with white       light. There were esophageal mucosal changes suspicious for       short-segment Barrett's esophagus. These changes involved the mucosa       along an irregular Z-line (35 cm from the  incisors). Squamous islands       were present from 34 to 35 cm. The maximum longitudinal extent of these       esophageal mucosal changes was 1 cm in length. Biopsies were obtained at       34 cm from the incisors and at the gastroesophageal junction with cold       forceps for histology.      Two non-bleeding cratered gastric ulcers with a clean ulcer base       (Forrest Class III) were found in the gastric antrum. The largest lesion       was 4 mm in largest dimension. Biopsies were obtained in the gastric       body, at the incisura and in the gastric antrum with cold forceps for       histology. Biopsies were taken away from the ulcer site due to patient's       recent GI bleed to prevent rebleeding.      The examined duodenum was normal. Impression:           - Esophageal mucosal changes suspicious for                        short-segment Barrett's esophagus.                       - Non-bleeding gastric ulcers with a clean ulcer base                        (Forrest Class III).                       - Normal examined duodenum.                       - Biopsies were obtained at 34 cm from the incisors and                        at the gastroesophageal junction.                       - Biopsies were obtained in the gastric body, at the                        incisura and in the gastric antrum. Recommendation:       - Use Protonix (pantoprazole) 40 mg IV BID.                       - Avoid NSAIDs except Aspirin if medically indicated                       - Continue Serial CBCs and transfuse PRN                       - As Gastric ulcers are small and do not explain  patient's significant drop in Hemeoglobin by itself and                        she reports weight loss, colonoscopy is indicated to                        rule out colonic lesions. Will plan for tomorrow if                        patient is agreeable.                       - Obtain iron studies and  transfuse IV iron if it is                        low.                       - The findings and recommendations were discussed with                        the patient.                       - Clear liquid diet.                       - Return patient to hospital ward for ongoing care. Procedure Code(s):    --- Professional ---                       [phone], Esophagogastroduodenoscopy, flexible, transoral;                        with biopsy, single or multiple Diagnosis Code(s):    --- Professional ---                       K22.8, Other specified diseases of esophagus                       K25.9, Gastric ulcer, unspecified as acute or chronic,                        without hemorrhage or perforation                       K92.1, Melena (includes Hematochezia)                       D62, Acute posthemorrhagic anemia CPT copyright 2017 American Medical Association. All rights reserved. The codes documented in this report are preliminary and upon coder review may  be revised to meet current compliance requirements.  [name], MD Margretta Sidle B. Bonna Gains MD, MD 06/14/2018 1:04:53 PM This report has been signed electronically. Number of Addenda: 0 Note Initiated On: 06/14/2018 12:22 PM      [facility]

## 2018-06-14 NOTE — Progress Notes (Signed)
Report called to [facility] , RN. Pt transported back to room 129 in stable condition. All belongings at bedside. Pt alert and oriented.

## 2018-06-14 NOTE — Consult Note (Signed)
Molly Antigua, MD [address], [place], [place], [place], [address] [id number], [place], [place], [place], [address] Phone: [phone]  Fax: [phone]  Consultation  Referring Provider:     Dr. Benjie Karvonen Primary Care Physician:  Molly Carbon, MD Reason for Consultation:     Anemia  Date of Admission:  06/13/2018 Date of Consultation:  06/14/2018         HPI:   Molly Peters is a 57 y.o. female with history of NSAID use at home, ibuprofen, admitted after a fall, and GI consulted for drop in hemoglobin.  Reports history of intermittent black stool.  Does not always look at her stool, but states at times notices it to be black in color.  Last bowel movement was brown, with spots of black yesterday.  No nausea or vomiting.  Reports intermittent abdominal pain in the midepigastric region, 5/10, nonradiating as well.  No dysphagia, weight loss, heartburn.  Hemoglobin 6.9 with baseline being 12.1 in December 2018.  Patient reported chest pain just prior to presentation, and states it has resolved since presentation.  Troponin have been negative.  CT scans did not find any evidence of dissection.  Patient has had previous EGD and colonoscopy, in 2009, please see report in procedures tab.  Erosive esophagitis in the distal esophagus were reported.  Proximal candidiasis from Symbicort use also reported.  Erosive antral gastritis also reported. Colonoscopy was reported to be normal except for internal and external hemorrhoids.  Biopsies were taken for diarrhea.  Pathology report was benign.   Past Medical History:  Diagnosis Date  . Anxiety   . Cancer ([facility])   . Chronic back pain 11/12/2009   Qualifier: Diagnosis of  By: Maxie Better FNP, Molly Peters   . COPD (chronic obstructive pulmonary disease) (HCC)    states SOB with ADLs; no O2 use; able to speak in complete sentences without SOB(11/15/2014)  . Depression   . Difficulty swallowing pills    s/p cervical fusion  . Family  history of adverse reaction to anesthesia    pt's mother and sister have hx. of post-op N/V  . Fibromyalgia   . GERD (gastroesophageal reflux disease)   . History of gastric ulcer   . Hypertension    states under control with med., has been on med. x 1-2 yr.  . IBS (irritable bowel syndrome)   . Internal hemorrhoid    states has had intermittent bright red bleeding with BM (11/15/2014)  . Limited joint range of motion    neck - s/p cervical fusion  . Localized primary osteoarthritis of right shoulder region 11/23/2014  . Osteoarthritis of left shoulder 07/05/2015  . Osteoarthritis of right shoulder 11/2014  . Seizures ([facility])    last seizure 2010  . Short-term memory loss   . TMJ syndrome   . Valvular heart disease    Initial workup a number of years ago at [facility].  Echo (10/2009) showed EF 60-65%,      normal LV size, moderate aortic insufficiency with a trileaflet aortic valve and normal aortic root size.  Mild      mitral regurgitation.   . Wears dentures    lower    Past Surgical History:  Procedure Laterality Date  . ABDOMINAL HYSTERECTOMY     partial  . ANTERIOR CERVICAL DECOMP/DISCECTOMY FUSION  02/06/2011   C4-5, C5-6, C6-7  . BREAST LUMPECTOMY Bilateral 1989   x 3 - benign  . CARDIAC CATHETERIZATION  1995  . CESAREAN  SECTION     x 2  . COLONOSCOPY  09/28/2008  . ESOPHAGOGASTRODUODENOSCOPY  09/28/2008  . HARDWARE REMOVAL  11/17/2006   L5-S1  . LAMINECTOMY WITH POSTERIOR LATERAL ARTHRODESIS LEVEL 3  12/06/2009   with synovial cyst resection L3-4 bilat.  Marland Kitchen LAMINECTOMY WITH POSTERIOR LATERAL ARTHRODESIS LEVEL 3  11/17/2006   L4-5  . NM MYOVIEW LTD     Lexiscan myoview (10/2009): EF 77%, normal wall motion, normal perfusion.   Marland Kitchen SHOULDER ARTHROSCOPY WITH DEBRIDEMENT AND BICEP TENDON REPAIR Right 04/13/2014   Procedure: RIGHT SHOULDER ARTHROSCOPY WITH DEBRIDEMENT EXTENSIVE;  Surgeon: Johnny Bridge, MD;  Location: [facility];  Service: Orthopedics;  Laterality: Right;    . TOTAL SHOULDER ARTHROPLASTY Right 11/23/2014   Procedure: TOTAL RIGHT SHOULDER ARTHROPLASTY;  Surgeon: Johnny Bridge, MD;  Location: [facility];  Service: Orthopedics;  Laterality: Right;  . TOTAL SHOULDER ARTHROPLASTY Left 07/05/2015   Procedure: LEFT TOTAL SHOULDER REPLACEMENT;  Surgeon: Marchia Bond, MD;  Location: [facility];  Service: Orthopedics;  Laterality: Left;    Prior to Admission medications   Medication Sig Start Date End Date Taking? Authorizing Provider  ALPRAZolam Duanne Moron) 1 MG tablet TAKE 1 TABLET THREE TIMES DAILY AS NEEDED FOR ANXIETY 05/02/18  Yes Viviana Simpler I, MD  gabapentin (NEURONTIN) 300 MG capsule TAKE 1 CAPSULE TWICE DAILY AND TAKE 2 CAPSULES AT BEDTIME. Patient taking differently: Take 1,200 mg by mouth at bedtime.  03/25/18  Yes Viviana Simpler I, MD  lamoTRIgine (LAMICTAL) 100 MG tablet TAKE 1 TABLET TWICE DAILY 06/06/18  Yes Molly Carbon, MD  losartan-hydrochlorothiazide [facility]) 100-12.5 MG tablet TAKE 1 TABLET EVERY DAY 05/02/18  Yes Viviana Simpler I, MD  PARoxetine (PAXIL) 20 MG tablet TAKE 1 TABLET EVERY MORNING 09/08/17  Yes Molly Carbon, MD  albuterol (PROVENTIL HFA;VENTOLIN HFA) 108 (90 Base) MCG/ACT inhaler Inhale 1-2 puffs into the lungs every 6 (six) hours as needed for wheezing or shortness of breath. Patient not taking: Reported on 06/13/2018 05/22/16   Molly Carbon, MD  Cholecalciferol (VITAMIN D3) 2000 units capsule Take 1 capsule (2,000 Units total) by mouth daily. Patient not taking: Reported on 10/27/2017 03/15/16   Milinda Pointer, MD  estradiol (ESTRACE VAGINAL) 0.1 MG/GM vaginal cream Use 3/4 of an applicator full 3 times a week (0.5gm 3 times a week) Patient not taking: Reported on 06/13/2018 12/03/17   Molly Carbon, MD    Family History  Problem Relation Age of Onset  . Cervical cancer Mother   . Anesthesia problems Mother        post-op N/V  . Rheum arthritis Mother   . Anxiety  disorder Mother   . Healthy Father   . Cancer Father        colon cancer  . Migraines Sister   . Cervical cancer Sister   . Anesthesia problems Sister        post-op N/V  . Migraines Brother   . Asthma Daughter   . Coronary artery disease Maternal Grandmother   . Hypertension Maternal Grandmother   . Diabetes Maternal Grandmother   . Coronary artery disease Paternal Grandmother   . Hypertension Paternal Grandmother   . Diabetes Paternal Grandmother      Social History   Tobacco Use  . Smoking status: Current Every Day Smoker    Packs/day: 1.50    Years: 42.00    Pack years: 63.00    Types: Cigarettes  . Smokeless tobacco: Never Used  Substance Use Topics  . Alcohol use: No    Alcohol/week: 0.0 oz    Comment: occasionally  . Drug use: No    Allergies as of 06/13/2018 - Review Complete 06/13/2018  Allergen Reaction Noted  . Carbamazepine Hives   . Divalproex sodium Swelling 01/01/2016  . Clonazepam Other (See Comments) 08/12/2005  . Divalproex sodium Swelling 08/12/2005  . Diphenhydramine hcl Other (See Comments) 08/12/2005  . Diphenhydramine hcl Rash 01/01/2016  . Tiotropium Other (See Comments) 01/01/2016  . Tiotropium bromide monohydrate Other (See Comments)   . Vicodin [hydrocodone-acetaminophen] Itching 11/15/2014    Review of Systems:    All systems reviewed and negative except where noted in HPI.   Physical Exam:  Vital signs in last 24 hours: Vitals:   06/14/18 0419 06/14/18 0834 06/14/18 0917 06/14/18 1013  BP: 101/71 (!) 137/93 (!) 146/86 (!) 157/98  Pulse: 70 69 69 69  Resp: 18 18 18    Temp: 98.3 F (36.8 C) 98 F (36.7 C) 98.1 F (36.7 C) 98 F (36.7 C)  TempSrc: Oral Oral Oral Oral  SpO2: 96% 96% 97% 97%  Weight:      Height:       Last BM Date: 06/12/18 General:   Pleasant, cooperative in NAD Head:  Normocephalic and atraumatic. Eyes:   No icterus.   Conjunctiva pink. PERRLA. Ears:  Normal auditory acuity. Neck:  Supple; no masses or  thyroidomegaly Lungs: Respirations even and unlabored. Lungs clear to auscultation bilaterally.   No wheezes, crackles, or rhonchi.  Abdomen:  Soft, nondistended, nontender. Normal bowel sounds. No appreciable masses or hepatomegaly.  No rebound or guarding.  Neurologic:  Alert and oriented x3;  grossly normal neurologically. Skin:  Intact without significant lesions or rashes. Cervical Nodes:  No significant cervical adenopathy. Psych:  Alert and cooperative. Normal affect.  LAB RESULTS: Recent Labs    06/13/18 1337 06/13/18 1646 06/13/18 2235 06/14/18 0416  WBC 6.9  --   --   --   HGB 7.7* 7.6* 7.5* 6.9*  HCT 23.7*  --   --   --   PLT 464*  --   --   --    BMET Recent Labs    06/13/18 1337 06/14/18 0416  NA 128* 137  K 2.8* 4.7  CL 95* 106  CO2 25 28  GLUCOSE 96 91  BUN <5* 5*  CREATININE 0.73 1.05*  CALCIUM 8.8* 8.7*   LFT No results for input(s): PROT, ALBUMIN, AST, ALT, ALKPHOS, BILITOT, BILIDIR, IBILI in the last 72 hours. PT/INR No results for input(s): LABPROT, INR in the last 72 hours.  STUDIES: Dg Cervical Spine Complete  Result Date: 06/13/2018 CLINICAL DATA:  Neck pain and right arm numbness. [place] out of bed last week. Prior cervical fusion. EXAM: CERVICAL SPINE - COMPLETE 4+ VIEW COMPARISON:  Cervical spine radiographs 04/25/2015. Soft tissue neck CT 12/06/2017. FINDINGS: Grade 1 anterolisthesis of C3 on C4 and C7 on T1 is unchanged from the prior radiographs. Sequelae of C4-C7 ACDF are again identified with unchanged appearance of the anterior fusion plate and screws. Bulky anterior vertebral osteophyte formation at C3-4 is unchanged. Mild C3-4 disc space narrowing is new from 2016. C7-T1 disc space narrowing has not significantly changed. Moderate to severe facet arthrosis is noted at C2-3, C3-4, and C7-T1. Multilevel osseous neural foraminal stenosis is again seen due to uncovertebral spurring and facet arthrosis, less well evaluated than on the prior  radiographs due to suboptimal positioning though with likely severe right-sided neural  foraminal stenosis at C6-7. No acute fracture is identified. The prevertebral soft tissues are within normal limits. The visualized lung apices are clear. IMPRESSION: 1. No acute osseous abnormality identified. 2. Stable appearance of C4-C7 ACDF. 3. Progressive C3-4 disc space narrowing from 2016. 4. Moderate to severe multilevel facet arthrosis. Electronically Signed   By: Logan Bores M.D.   On: 06/13/2018 14:35   Ct Head Wo Contrast  Result Date: 06/13/2018 CLINICAL DATA:  [place] off of couch 2 weeks ago. Pain. Headache. More recent fall, hitting top of head. EXAM: CT HEAD WITHOUT CONTRAST TECHNIQUE: Contiguous axial images were obtained from the base of the skull through the vertex without intravenous contrast. COMPARISON:  CT head 01/11/2018.  MR head 03/20/2009. FINDINGS: Brain: No evidence for acute infarction, hemorrhage, mass lesion, hydrocephalus, or extra-axial fluid. Premature for age atrophy. Extensive hypoattenuation throughout the white matter, consistent with small vessel disease, also premature for age. Remote LEFT basal ganglia lacunar infarct, was chronic in 2010. Vascular: Calcification of the cavernous internal carotid arteries consistent with cerebrovascular atherosclerotic disease. No signs of intracranial large vessel occlusion. Skull: Calvarium intact. Sinuses/Orbits: Clear sinuses.  No orbital findings of significance. Other: Advanced LEFT TMJ arthritis. Compared to priors, similar appearance. Previously noted scalp hematoma is improved. IMPRESSION: Atrophy and small vessel disease. Evidence for remote LEFT basal ganglia infarct. No acute intracranial findings. Similar appearance to 01/11/2018. No posttraumatic sequelae are evident. Electronically Signed   By: Staci Righter M.D.   On: 06/13/2018 14:07   Ct Angio Chest/abd/pel For Dissection W And/or Wo Contrast  Result Date: 06/13/2018 CLINICAL DATA:   Chest pain EXAM: CT ANGIOGRAPHY CHEST, ABDOMEN AND PELVIS TECHNIQUE: Multidetector CT imaging through the chest, abdomen and pelvis was performed using the standard protocol during bolus administration of intravenous contrast. Multiplanar reconstructed images and MIPs were obtained and reviewed to evaluate the vascular anatomy. CONTRAST:  30mL ISOVUE-370 IOPAMIDOL (ISOVUE-370) INJECTION 76% COMPARISON:  02/02/2018.  06/26/2011. FINDINGS: CTA CHEST FINDINGS Cardiovascular: Atherosclerotic calcifications of the thoracic aorta are noted. This includes the aortic arch. Moderate 3 vessel coronary artery calcification. There is no evidence of acute intramural hematoma on noncontrast images. There is no evidence of aortic dissection or aneurysm. The thoracic aorta is patent with scattered atherosclerotic calcifications and smooth plaque in the descending thoracic aorta. Great vessels are patent within the confines of the examination. Vertebral arteries are also patent. Mediastinum/Nodes: No abnormal mediastinal adenopathy. Thyroid is atrophic. No pericardial effusion. Lungs/Pleura: Advanced emphysema towards the lung apices which is panlobular. Stable 3 mm left lower lobe pulmonary nodule on image 98 of series 7. Stable 3 mm left upper lobe pulmonary nodule on image 60. Musculoskeletal: No vertebral compression deformity. Right shoulder arthroplasty is in place. No evidence of acute rib fracture. No destructive bone lesion. Review of the MIP images confirms the above findings. CTA ABDOMEN AND PELVIS FINDINGS VASCULAR Aorta: Nonaneurysmal and patent with scattered atherosclerotic calcifications and soft plaque. No evidence of aortic dissection. Celiac: Patent. SMA: Patent. Renals: 2 right renal arteries are patent. There are atherosclerotic changes at the origin of the left renal artery. Exact degree of narrowing is difficult. IMA: Diminutive and patent. Inflow: There is smooth soft plaque as well as atherosclerotic  calcified plaque in both common iliac arteries without aneurysmal dilatation, significant narrowing, or dissection. Scattered atherosclerotic changes of the bilateral internal and external iliac arteries are also present without evidence of significant narrowing or dissection. Review of the MIP images confirms the above findings. NON-VASCULAR Hepatobiliary: Gallbladder and  liver are within normal limits for arterial phase imaging. Pancreas: Unremarkable Spleen: Unremarkable Adrenals/Urinary Tract: Prominence of the adrenal glands in a hyperplasia pattern is stable. Kidneys are within normal limits bilaterally. Stomach/Bowel: Stomach and duodenum are within normal limits. There is no dilatation of bowel to suggest obstruction. No obvious mass in the visualized colon. Lymphatic: No abnormal retroperitoneal adenopathy Reproductive: Uterus is absent.  Adnexa are unremarkable. Other: No free fluid. Musculoskeletal: No vertebral compression deformity. L3 through S1 fusion hardware is in place. Review of the MIP images confirms the above findings. IMPRESSION: Vascular: No evidence of aortic dissection or intramural hematoma. The abdominal aorta is nonaneurysmal and patent. Nonvascular: Stable 3 mm pulmonary nodules in the left lung supporting benign etiology. No acute intra thoracic or intra-abdominal pathology. Aortic Atherosclerosis (ICD10-I70.0) and Emphysema (ICD10-J43.9). Electronically Signed   By: Marybelle Killings M.D.   On: 06/13/2018 15:32      Impression / Plan:   LAKETA SANDOZ is a 57 y.o. y/o female with history of NSAID use at home, presents after a fall, GI consulted for reports of intermittent melena at home, and hemoglobin drop to 6.9 from 12 in December 2018  PPI IV twice daily  Continue serial CBCs and transfuse PRN Avoid NSAIDs Maintain 2 large-bore IV lines Please page GI with any acute hemodynamic changes, or signs of active GI bleeding  We will plan on EGD today to evaluate for peptic  ulcer disease, esophagitis or any other lesions to explain her melena. If this is negative, due to acutely low hemoglobin, will proceed with colonoscopy tomorrow  See EGD report from today once procedure is complete for findings and recommendations   Thank you for involving me in the care of this patient.      LOS: 1 day   Virgel Manifold, MD  06/14/2018, 12:07 PM

## 2018-06-14 NOTE — Progress Notes (Signed)
Family Meeting Note  Advance Directive:no  Today a meeting took place with the Patient.   The following clinical team members were present during this meeting:MD  The following were discussed:Patient's diagnosis: , Patient's progosis: > 12 months and Goals for treatment: DNR  Additional follow-up to be provided: dnr written and chaplain consult for advanced directives  Time spent during discussion:16 minutes  Olga Bourbeau, MD

## 2018-06-14 NOTE — Progress Notes (Signed)
**Note Molly-Identified via Obfuscation** [facility] [place] NAME: Molly Peters    MR#:  [id number]  DATE OF BIRTH:  1961-06-11  SUBJECTIVE:   Patient presented with weakness.  She has been taking BC powder and NSAIDs.  She has been having dark-colored stools.  REVIEW OF SYSTEMS:    Review of Systems  Constitutional: Negative for fever, chills weight loss HENT: Negative for ear pain, nosebleeds, congestion, facial swelling, rhinorrhea, neck pain, neck stiffness and ear discharge.   Respiratory: Negative for cough, shortness of breath, wheezing  Cardiovascular: Negative for chest pain, palpitations and leg swelling.  Gastrointestinal: Denies abdominal pain positive melena no hematochezia or hematuria.  Positive nausea and vomiting Genitourinary: Negative for dysuria, urgency, frequency, hematuria Musculoskeletal: Negative for back pain or joint pain Neurological: Negative for dizziness, seizures, syncope, focal weakness,  numbness and headaches.  Hematological: Does not bruise/bleed easily.  Psychiatric/Behavioral: Negative for hallucinations, confusion, dysphoric mood    Tolerating Diet: npo      DRUG ALLERGIES:   Allergies  Allergen Reactions  . Carbamazepine Hives  . Divalproex Sodium Swelling    HAIR FALLS OUT  . Clonazepam Other (See Comments)    HALLUCINATIONS HALLUCINATIONS  . Divalproex Sodium Swelling    HAIR FALLS OUT  . Diphenhydramine Hcl Other (See Comments)    UNKNOWN  . Diphenhydramine Hcl Rash    UNKNOWN  . Tiotropium Other (See Comments)    CREATES YEAST IN THROAT  . Tiotropium Bromide Monohydrate Other (See Comments)    CREATES YEAST IN THROAT  . Vicodin [Hydrocodone-Acetaminophen] Itching    VITALS:  Blood pressure (!) 146/86, pulse 69, temperature 98.1 F (36.7 C), temperature source Oral, resp. rate 18, height 5\' 1"  (1.549 m), weight 95 lb (43.1 kg), SpO2 97 %.  PHYSICAL EXAMINATION:  Constitutional: Appears well-developed and  well-nourished. No distress. HENT: Normocephalic. [place] Oropharynx is clear and moist.  Eyes: Conjunctivae and EOM are normal. PERRLA, no scleral icterus.  Neck: Normal ROM. Neck supple. No JVD. No tracheal deviation. CVS: RRR, S1/S2 +, no murmurs, no gallops, no carotid bruit.  Pulmonary: Effort and breath sounds normal, no stridor, rhonchi, wheezes, rales.  Abdominal: Soft. BS +,  no distension, tenderness, rebound or guarding.  Musculoskeletal: Normal range of motion. No edema and no tenderness.  Neuro: Alert. CN 2-12 grossly intact. No focal deficits. Skin: Skin is warm and dry. No rash noted. Psychiatric: Normal mood and affect.      LABORATORY PANEL:   CBC Recent Labs  Lab 06/13/18 1337  06/14/18 0416  WBC 6.9  --   --   HGB 7.7*   < > 6.9*  HCT 23.7*  --   --   PLT 464*  --   --    < > = values in this interval not displayed.   ------------------------------------------------------------------------------------------------------------------  Chemistries  Recent Labs  Lab 06/13/18 1646 06/14/18 0416  NA  --  137  K  --  4.7  CL  --  106  CO2  --  28  GLUCOSE  --  91  BUN  --  5*  CREATININE  --  1.05*  CALCIUM  --  8.7*  MG 1.9  --    ------------------------------------------------------------------------------------------------------------------  Cardiac Enzymes Recent Labs  Lab 06/13/18 1337  TROPONINI <0.03   ------------------------------------------------------------------------------------------------------------------  RADIOLOGY:  Dg Cervical Spine Complete  Result Date: 06/13/2018 CLINICAL DATA:  Neck pain and right arm numbness. [place] out of bed last week. Prior cervical fusion. EXAM: CERVICAL  SPINE - COMPLETE 4+ VIEW COMPARISON:  Cervical spine radiographs 04/25/2015. Soft tissue neck CT 12/06/2017. FINDINGS: Grade 1 anterolisthesis of C3 on C4 and C7 on T1 is unchanged from the prior radiographs. Sequelae of C4-C7 ACDF are again identified with  unchanged appearance of the anterior fusion plate and screws. Bulky anterior vertebral osteophyte formation at C3-4 is unchanged. Mild C3-4 disc space narrowing is new from 2016. C7-T1 disc space narrowing has not significantly changed. Moderate to severe facet arthrosis is noted at C2-3, C3-4, and C7-T1. Multilevel osseous neural foraminal stenosis is again seen due to uncovertebral spurring and facet arthrosis, less well evaluated than on the prior radiographs due to suboptimal positioning though with likely severe right-sided neural foraminal stenosis at C6-7. No acute fracture is identified. The prevertebral soft tissues are within normal limits. The visualized lung apices are clear. IMPRESSION: 1. No acute osseous abnormality identified. 2. Stable appearance of C4-C7 ACDF. 3. Progressive C3-4 disc space narrowing from 2016. 4. Moderate to severe multilevel facet arthrosis. Electronically Signed   By: Logan Bores M.D.   On: 06/13/2018 14:35   Ct Head Wo Contrast  Result Date: 06/13/2018 CLINICAL DATA:  [place] off of couch 2 weeks ago. Pain. Headache. More recent fall, hitting top of head. EXAM: CT HEAD WITHOUT CONTRAST TECHNIQUE: Contiguous axial images were obtained from the base of the skull through the vertex without intravenous contrast. COMPARISON:  CT head 01/11/2018.  MR head 03/20/2009. FINDINGS: Brain: No evidence for acute infarction, hemorrhage, mass lesion, hydrocephalus, or extra-axial fluid. Premature for age atrophy. Extensive hypoattenuation throughout the white matter, consistent with small vessel disease, also premature for age. Remote LEFT basal ganglia lacunar infarct, was chronic in 2010. Vascular: Calcification of the cavernous internal carotid arteries consistent with cerebrovascular atherosclerotic disease. No signs of intracranial large vessel occlusion. Skull: Calvarium intact. Sinuses/Orbits: Clear sinuses.  No orbital findings of significance. Other: Advanced LEFT TMJ arthritis.  Compared to priors, similar appearance. Previously noted scalp hematoma is improved. IMPRESSION: Atrophy and small vessel disease. Evidence for remote LEFT basal ganglia infarct. No acute intracranial findings. Similar appearance to 01/11/2018. No posttraumatic sequelae are evident. Electronically Signed   By: Staci Righter M.D.   On: 06/13/2018 14:07   Ct Angio Chest/abd/pel For Dissection W And/or Wo Contrast  Result Date: 06/13/2018 CLINICAL DATA:  Chest pain EXAM: CT ANGIOGRAPHY CHEST, ABDOMEN AND PELVIS TECHNIQUE: Multidetector CT imaging through the chest, abdomen and pelvis was performed using the standard protocol during bolus administration of intravenous contrast. Multiplanar reconstructed images and MIPs were obtained and reviewed to evaluate the vascular anatomy. CONTRAST:  9mL ISOVUE-370 IOPAMIDOL (ISOVUE-370) INJECTION 76% COMPARISON:  02/02/2018.  06/26/2011. FINDINGS: CTA CHEST FINDINGS Cardiovascular: Atherosclerotic calcifications of the thoracic aorta are noted. This includes the aortic arch. Moderate 3 vessel coronary artery calcification. There is no evidence of acute intramural hematoma on noncontrast images. There is no evidence of aortic dissection or aneurysm. The thoracic aorta is patent with scattered atherosclerotic calcifications and smooth plaque in the descending thoracic aorta. Great vessels are patent within the confines of the examination. Vertebral arteries are also patent. Mediastinum/Nodes: No abnormal mediastinal adenopathy. Thyroid is atrophic. No pericardial effusion. Lungs/Pleura: Advanced emphysema towards the lung apices which is panlobular. Stable 3 mm left lower lobe pulmonary nodule on image 98 of series 7. Stable 3 mm left upper lobe pulmonary nodule on image 60. Musculoskeletal: No vertebral compression deformity. Right shoulder arthroplasty is in place. No evidence of acute rib fracture. No destructive bone lesion.  Review of the MIP images confirms the above  findings. CTA ABDOMEN AND PELVIS FINDINGS VASCULAR Aorta: Nonaneurysmal and patent with scattered atherosclerotic calcifications and soft plaque. No evidence of aortic dissection. Celiac: Patent. SMA: Patent. Renals: 2 right renal arteries are patent. There are atherosclerotic changes at the origin of the left renal artery. Exact degree of narrowing is difficult. IMA: Diminutive and patent. Inflow: There is smooth soft plaque as well as atherosclerotic calcified plaque in both common iliac arteries without aneurysmal dilatation, significant narrowing, or dissection. Scattered atherosclerotic changes of the bilateral internal and external iliac arteries are also present without evidence of significant narrowing or dissection. Review of the MIP images confirms the above findings. NON-VASCULAR Hepatobiliary: Gallbladder and liver are within normal limits for arterial phase imaging. Pancreas: Unremarkable Spleen: Unremarkable Adrenals/Urinary Tract: Prominence of the adrenal glands in a hyperplasia pattern is stable. Kidneys are within normal limits bilaterally. Stomach/Bowel: Stomach and duodenum are within normal limits. There is no dilatation of bowel to suggest obstruction. No obvious mass in the visualized colon. Lymphatic: No abnormal retroperitoneal adenopathy Reproductive: Uterus is absent.  Adnexa are unremarkable. Other: No free fluid. Musculoskeletal: No vertebral compression deformity. L3 through S1 fusion hardware is in place. Review of the MIP images confirms the above findings. IMPRESSION: Vascular: No evidence of aortic dissection or intramural hematoma. The abdominal aorta is nonaneurysmal and patent. Nonvascular: Stable 3 mm pulmonary nodules in the left lung supporting benign etiology. No acute intra thoracic or intra-abdominal pathology. Aortic Atherosclerosis (ICD10-I70.0) and Emphysema (ICD10-J43.9). Electronically Signed   By: Marybelle Killings M.D.   On: 06/13/2018 15:32     ASSESSMENT AND PLAN:   57 year old female with history of chronic back pain, COPD and tobacco dependence who presents to the emergency room due to a fall and anemia.  1.  Acute blood loss anemia from presumed upper GI bleed Patient will receive 1 unit PRBCs EGD plan for today Continue PPI  2.Tobacco dependence: Patient is encouraged to quit smoking. Counseling was provided for 4 minutes.   3.  Essential hypertension: Continue Hyzaar.  4.  Anxiety/depression: Continue Paxil and Xanax     Management plans discussed with the patient and she is in agreement.  CODE STATUS: DNR  TOTAL TIME TAKING CARE OF THIS PATIENT: 30 minutes.     POSSIBLE D/C tomorrow, DEPENDING ON CLINICAL CONDITION.   Molly Peters M.D on 06/14/2018 at 10:02 AM  Between 7am to 6pm - Pager - [phone] After 6pm go to [email or web address] - password [email or web address] [facility]  Office  [phone]  CC: Primary care physician; Venia Carbon, MD  Note: This dictation was prepared with Dragon dictation along with smaller phrase technology. Any transcriptional errors that result from this process are unintentional.

## 2018-06-14 NOTE — Anesthesia Post-op Follow-up Note (Signed)
Anesthesia QCDR form completed.        

## 2018-06-14 NOTE — Transfer of Care (Signed)
Immediate Anesthesia Transfer of Care Note  Patient: CORTINA VULTAGGIO  Procedure(s) Performed: ESOPHAGOGASTRODUODENOSCOPY (EGD) (N/A )  Patient Location: PACU and Endoscopy Unit  Anesthesia Type:General  Level of Consciousness: awake and patient cooperative  Airway & Oxygen Therapy: Patient Spontanous Breathing and Patient connected to nasal cannula oxygen  Post-op Assessment: Report given to RN and Post -op Vital signs reviewed and stable  Post vital signs: Reviewed and stable  Last Vitals:  Vitals Value Taken Time  BP    Temp    Pulse    Resp    SpO2      Last Pain:  Vitals:   06/14/18 1013  TempSrc: Oral  PainSc:       Patients Stated Pain Goal: 5 (48/27/07 8675)  Complications: No apparent anesthesia complications

## 2018-06-15 ENCOUNTER — Inpatient Hospital Stay: Payer: Medicare HMO | Admitting: Registered Nurse

## 2018-06-15 ENCOUNTER — Encounter: Payer: Self-pay | Admitting: Gastroenterology

## 2018-06-15 ENCOUNTER — Encounter
Admission: EM | Disposition: A | Discharge status: Home / Self Care | Payer: Self-pay | Source: Home / Self Care | Attending: Internal Medicine

## 2018-06-15 DIAGNOSIS — D509 Iron deficiency anemia, unspecified: Secondary | ICD-10-CM

## 2018-06-15 DIAGNOSIS — D125 Benign neoplasm of sigmoid colon: Secondary | ICD-10-CM

## 2018-06-15 DIAGNOSIS — K635 Polyp of colon: Secondary | ICD-10-CM

## 2018-06-15 HISTORY — PX: COLONOSCOPY: SHX5424

## 2018-06-15 LAB — TYPE AND SCREEN
ABO/RH(D): B POS
Antibody Screen: NEGATIVE
Unit division: 0

## 2018-06-15 LAB — BASIC METABOLIC PANEL
ANION GAP: 9 (ref 5–15)
BUN: 5 mg/dL — ABNORMAL LOW (ref 6–20)
CALCIUM: 9.3 mg/dL (ref 8.9–10.3)
CO2: 25 mmol/L (ref 22–32)
CREATININE: 0.83 mg/dL (ref 0.44–1.00)
Chloride: 104 mmol/L (ref 98–111)
GFR calc non Af Amer: >60 mL/min (ref >60)
Glucose, Bld: 83 mg/dL (ref 70–99)
Potassium: 3.9 mmol/L (ref 3.5–5.1)
SODIUM: 138 mmol/L (ref 135–145)

## 2018-06-15 LAB — CBC
HEMATOCRIT: 30.6 % — AB (ref 35.0–47.0)
HEMOGLOBIN: 9.8 g/dL — AB (ref 12.0–16.0)
MCH: 22.7 pg — ABNORMAL LOW (ref 26.0–34.0)
MCHC: 31.9 g/dL — ABNORMAL LOW (ref 32.0–36.0)
MCV: 71.1 fL — ABNORMAL LOW (ref 80.0–100.0)
Platelets: 409 10*3/uL (ref 150–440)
RBC: 4.3 MIL/uL (ref 3.80–5.20)
RDW: 20.5 % — AB (ref 11.5–14.5)
WBC: 5.4 10*3/uL (ref 3.6–11.0)

## 2018-06-15 LAB — BPAM RBC
Blood Product Expiration Date: 201909062359
ISSUE DATE / TIME: 201908060845
UNIT TYPE AND RH: 7300

## 2018-06-15 LAB — OCCULT BLOOD X 1 CARD TO LAB, STOOL: Fecal Occult Bld: NEGATIVE

## 2018-06-15 SURGERY — COLONOSCOPY
Anesthesia: General

## 2018-06-15 MED ORDER — FENTANYL CITRATE (PF) 100 MCG/2ML IJ SOLN
INTRAMUSCULAR | Status: AC
  Filled 2018-06-15: qty 2

## 2018-06-15 MED ORDER — GABAPENTIN 300 MG PO CAPS: 1200.0000 mg | ORAL_CAPSULE | Freq: Every day | ORAL | Status: DC

## 2018-06-15 MED ORDER — GLYCOPYRROLATE 0.2 MG/ML IJ SOLN
INTRAMUSCULAR | Status: AC
  Filled 2018-06-15: qty 1

## 2018-06-15 MED ORDER — LIDOCAINE HCL (CARDIAC) PF 100 MG/5ML IV SOSY
PREFILLED_SYRINGE | INTRAVENOUS | Status: DC | PRN
  Administered 2018-06-15: 50 mg via INTRATRACHEAL

## 2018-06-15 MED ORDER — PROPOFOL 500 MG/50ML IV EMUL
INTRAVENOUS | Status: DC | PRN
  Administered 2018-06-15: 50 ug/kg/min via INTRAVENOUS

## 2018-06-15 MED ORDER — PROPOFOL 10 MG/ML IV BOLUS
INTRAVENOUS | Status: DC | PRN
  Administered 2018-06-15: 10 mg via INTRAVENOUS
  Administered 2018-06-15: 20 mg via INTRAVENOUS
  Administered 2018-06-15 (×3): 10 mg via INTRAVENOUS

## 2018-06-15 MED ORDER — PANTOPRAZOLE SODIUM 40 MG PO TBEC: 40.0000 mg | DELAYED_RELEASE_TABLET | Freq: Two times a day (BID) | ORAL | 1 refills | Status: DC

## 2018-06-15 MED ORDER — LIDOCAINE HCL (PF) 2 % IJ SOLN
INTRAMUSCULAR | Status: AC
  Filled 2018-06-15: qty 10

## 2018-06-15 MED ORDER — FENTANYL CITRATE (PF) 100 MCG/2ML IJ SOLN
INTRAMUSCULAR | Status: DC | PRN
  Administered 2018-06-15: 50 ug via INTRAVENOUS
  Administered 2018-06-15 (×4): 25 ug via INTRAVENOUS
  Administered 2018-06-15: 50 ug via INTRAVENOUS

## 2018-06-15 MED ORDER — PROPOFOL 500 MG/50ML IV EMUL
INTRAVENOUS | Status: AC
  Filled 2018-06-15: qty 50

## 2018-06-15 MED ORDER — SODIUM CHLORIDE 0.9 % IV SOLN
INTRAVENOUS | Status: DC | PRN
  Administered 2018-06-15: 12:00:00 via INTRAVENOUS

## 2018-06-15 MED ORDER — NICOTINE 14 MG/24HR TD PT24: 14.0000 mg | MEDICATED_PATCH | Freq: Every day | TRANSDERMAL | 0 refills | Status: DC

## 2018-06-15 NOTE — Anesthesia Post-op Follow-up Note (Signed)
Anesthesia QCDR form completed.        

## 2018-06-15 NOTE — Progress Notes (Signed)
Pt is d/ced home after having EGD yesterday and colonoscopy today.  EGD showed areas concerning for Barrett's esophagus - tissue sent for biopsy, and non-bleeding ulcers.  Colonoscopy showed polyp which was removed.  Pt received unit of blood yesterday for Hgb of 6.9, today it's 9.8.  K+ improved and today its 3.9, Na improved to 138.  Stool was negative for blood.  Pt will go home w/nictoine patch and protonix.  IVs removed by nurse tech.  Husband is here to take her home.  Will review d/c instructions and f/u appt.

## 2018-06-15 NOTE — Op Note (Addendum)
[facility] Patient Name: Molly Peters Procedure Date: 06/15/2018 11:55 AM MRN: [id number] Account #: [id number] Date of Birth: [birth year] Admit Type: Inpatient Age: 57 Room: [facility] ENDO ROOM 2 Gender: Female Note Status: Finalized Procedure:            Colonoscopy Indications:          Microcytic anemia Providers:            Tanaisha Pittman B. Bonna Gains MD, MD Referring MD:         Venia Carbon (Referring MD) Medicines:            Monitored Anesthesia Care Complications:        No immediate complications. Procedure:            Pre-Anesthesia Assessment:                       - ASA Grade Assessment: III - A patient with severe                        systemic disease.                       - Prior to the procedure, a History and Physical was                        performed, and patient medications, allergies and                        sensitivities were reviewed. The patient's tolerance of                        previous anesthesia was reviewed.                       - The risks and benefits of the procedure and the                        sedation options and risks were discussed with the                        patient. All questions were answered and informed                        consent was obtained.                       - Patient identification and proposed procedure were                        verified prior to the procedure by the physician, the                        nurse, the anesthesiologist, the anesthetist and the                        technician. The procedure was verified in the procedure                        room.                       After obtaining informed  consent, the colonoscope was                        passed under direct vision. Throughout the procedure,                        the patient's blood pressure, pulse, and oxygen                        saturations were monitored continuously. The   Colonoscope was introduced through the anus and                        advanced to the the cecum, identified by appendiceal                        orifice and ileocecal valve. The colonoscopy was                        performed with ease. The patient tolerated the                        procedure well. The quality of the bowel preparation                        was fair. Pt only drank half of her prep. Findings:      The perianal and digital rectal examinations were normal.      A 5 mm polyp was found in the sigmoid colon. The polyp was sessile. The       polyp was removed with a cold snare. Resection and retrieval were       complete. After the polyp was removed and before it was suctioned, the       power to the facility went off for a brief second causing the computer       system and screen to restart. The colonoscope was held in position       safely without moving it until the computer restarted and the polyp was       safely retrieved.      The exam was otherwise without abnormality.      The rectum, sigmoid colon, descending colon, transverse colon, ascending       colon and cecum appeared normal.      The retroflexed view of the distal rectum and anal verge was normal and       showed no anal or rectal abnormalities. Impression:           - Preparation of the colon was fair.                       - One 5 mm polyp in the sigmoid colon, removed with a                        cold snare. Resected and retrieved.                       - The examination was otherwise normal.                       - The rectum, sigmoid colon, descending colon,  transverse colon, ascending colon and cecum are normal.                       - The distal rectum and anal verge are normal on                        retroflexion view. Recommendation:       - Advance diet as tolerated.                       - Continue present medications.                       - Await pathology results.                        - Repeat colonoscopy 6-12 months due to fair prep on                        this exam.                       - The findings and recommendations were discussed with                        the patient.                       - The findings and recommendations were discussed with                        the patient's family.                       - Return to primary care physician as previously                        scheduled.                       - Return to GI clinic in 2 weeks.                       - Need for small bowel capsule for anemia can be                        discussed in clinic. No active GI bleeding at this time.                       - Avoid NSAIDs except Aspirin if medically indicated                       - Continue Serial CBCs and transfuse PRN Procedure Code(s):    --- Professional ---                       [phone], Colonoscopy, flexible; with removal of tumor(s),                        polyp(s), or other lesion(s) by snare technique Diagnosis Code(s):    --- Professional ---  D12.5, Benign neoplasm of sigmoid colon CPT copyright 2017 American Medical Association. All rights reserved. The codes documented in this report are preliminary and upon coder review may  be revised to meet current compliance requirements.  [name], MD Margretta Sidle B. Bonna Gains MD, MD 06/15/2018 12:49:16 PM This report has been signed electronically. Number of Addenda: 0 Note Initiated On: 06/15/2018 11:55 AM Total Procedure Duration: 0 hours 39 minutes 11 seconds  Estimated Blood Loss: Estimated blood loss: none.      [facility]

## 2018-06-15 NOTE — Anesthesia Preprocedure Evaluation (Signed)
Anesthesia Evaluation  Patient identified by MRN, date of birth, ID band Patient awake    Reviewed: Allergy & Precautions, H&P , NPO status , reviewed documented beta blocker date and time   History of Anesthesia Complications (+) Family history of anesthesia reaction and history of anesthetic complications  Airway Mallampati: III  TM Distance: >3 FB Neck ROM: full    Dental  (+) Lower Dentures, Chipped, Poor Dentition, Missing   Pulmonary COPD, Current Smoker,    Pulmonary exam normal        Cardiovascular hypertension, Normal cardiovascular exam     Neuro/Psych Seizures -,  PSYCHIATRIC DISORDERS Anxiety Depression  Neuromuscular disease    GI/Hepatic PUD, GERD  ,  Endo/Other    Renal/GU      Musculoskeletal  (+) Arthritis , Fibromyalgia -  Abdominal   Peds  Hematology  (+) anemia ,   Anesthesia Other Findings Past Medical History: No date: Anxiety No date: Cancer ([place]) 11/12/2009: Chronic back pain     Comment:  Qualifier: Diagnosis of  By: Maxie Better FNP, Rosalita Levan  No date: COPD (chronic obstructive pulmonary disease) ([place])     Comment:  states SOB with ADLs; no O2 use; able to speak in               complete sentences without SOB(11/15/2014) No date: Depression No date: Difficulty swallowing pills     Comment:  s/p cervical fusion No date: Family history of adverse reaction to anesthesia     Comment:  pt's mother and sister have hx. of post-op N/V No date: Fibromyalgia No date: GERD (gastroesophageal reflux disease) No date: History of gastric ulcer No date: Hypertension     Comment:  states under control with med., has been on med. x 1-2               yr. No date: IBS (irritable bowel syndrome) No date: Internal hemorrhoid     Comment:  states has had intermittent bright red bleeding with BM               (11/15/2014) No date: Limited joint range of motion     Comment:  neck - s/p  cervical fusion 11/23/2014: Localized primary osteoarthritis of right shoulder region 07/05/2015: Osteoarthritis of left shoulder 11/2014: Osteoarthritis of right shoulder No date: Seizures ([place])     Comment:  last seizure 2010 No date: Short-term memory loss No date: TMJ syndrome No date: Valvular heart disease     Comment:  Initial workup a number of years ago at [facility].  Echo               (10/2009) showed EF 60-65%,      normal LV size, moderate              aortic insufficiency with a trileaflet aortic valve and               normal aortic root size.  Mild      mitral regurgitation. No date: Wears dentures     Comment:  lower  Past Surgical History: No date: ABDOMINAL HYSTERECTOMY     Comment:  partial 02/06/2011: ANTERIOR CERVICAL DECOMP/DISCECTOMY FUSION     Comment:  C4-5, C5-6, C6-7 1989: BREAST LUMPECTOMY; Bilateral     Comment:  x 3 - benign 1995: CARDIAC CATHETERIZATION No date: CESAREAN SECTION     Comment:  x 2 09/28/2008: COLONOSCOPY  09/28/2008: ESOPHAGOGASTRODUODENOSCOPY 11/17/2006: HARDWARE REMOVAL     Comment:  L5-S1 12/06/2009: LAMINECTOMY WITH POSTERIOR LATERAL ARTHRODESIS LEVEL 3     Comment:  with synovial cyst resection L3-4 bilat. 11/17/2006: LAMINECTOMY WITH POSTERIOR LATERAL ARTHRODESIS LEVEL 3     Comment:  L4-5 No date: NM MYOVIEW LTD     Comment:  Lexiscan myoview (10/2009): EF 77%, normal wall motion,               normal perfusion.  04/13/2014: SHOULDER ARTHROSCOPY WITH DEBRIDEMENT AND BICEP TENDON  REPAIR; Right     Comment:  Procedure: RIGHT SHOULDER ARTHROSCOPY WITH DEBRIDEMENT               EXTENSIVE;  Surgeon: Johnny Bridge, MD;  Location:               [place];  Service: Orthopedics;                Laterality: Right; 11/23/2014: TOTAL SHOULDER ARTHROPLASTY; Right     Comment:  Procedure: TOTAL RIGHT SHOULDER ARTHROPLASTY;  Surgeon:               Johnny Bridge, MD;  Location: [place];  Service:  Orthopedics;  Laterality: Right; 07/05/2015: TOTAL SHOULDER ARTHROPLASTY; Left     Comment:  Procedure: LEFT TOTAL SHOULDER REPLACEMENT;  Surgeon:               Marchia Bond, MD;  Location: [place];               Service: Orthopedics;  Laterality: Left;  BMI    Body Mass Index:  17.95 kg/m      Reproductive/Obstetrics                             Anesthesia Physical Anesthesia Plan  ASA: III  Anesthesia Plan: General   Post-op Pain Management:    Induction: Intravenous  PONV Risk Score and Plan: Treatment may vary due to age or medical condition and TIVA  Airway Management Planned: Nasal Cannula and Natural Airway  Additional Equipment:   Intra-op Plan:   Post-operative Plan:   Informed Consent: I have reviewed the patients History and Physical, chart, labs and discussed the procedure including the risks, benefits and alternatives for the proposed anesthesia with the patient or authorized representative who has indicated his/her understanding and acceptance.   Dental Advisory Given  Plan Discussed with: CRNA  Anesthesia Plan Comments:         Anesthesia Quick Evaluation

## 2018-06-15 NOTE — Transfer of Care (Signed)
Immediate Anesthesia Transfer of Care Note  Patient: Molly Peters  Procedure(s) Performed: COLONOSCOPY (N/A )  Patient Location: PACU  Anesthesia Type:General  Level of Consciousness: sedated  Airway & Oxygen Therapy: Patient Spontanous Breathing and Patient connected to nasal cannula oxygen  Post-op Assessment: Report given to RN and Post -op Vital signs reviewed and stable  Post vital signs: Reviewed and stable  Last Vitals:  Vitals Value Taken Time  BP 153/89 06/15/2018 12:49 PM  Temp 36.1 C 06/15/2018 12:49 PM  Pulse 85 06/15/2018 12:49 PM  Resp 15 06/15/2018 12:49 PM  SpO2 99 % 06/15/2018 12:49 PM    Last Pain:  Vitals:   06/15/18 1039  TempSrc:   PainSc: 0-No pain      Patients Stated Pain Goal: 5 (25/85/27 7824)  Complications: No apparent anesthesia complications

## 2018-06-15 NOTE — Discharge Summary (Signed)
[facility] NAME: Molly Peters    MR#:  [id number]  DATE OF BIRTH:  08/19/1961  DATE OF ADMISSION:  06/13/2018 ADMITTING PHYSICIAN: Demetrios Loll, MD  DATE OF DISCHARGE: 06/15/2018  PRIMARY CARE PHYSICIAN: Venia Carbon, MD    ADMISSION DIAGNOSIS:  Upper GI bleed [K92.2] Chest pain, unspecified type [R07.9]  DISCHARGE DIAGNOSIS:  Active Problems: Acute blood loss anemia with gastric ulcer and EGD   SECONDARY DIAGNOSIS:   Past Medical History:  Diagnosis Date  . Anxiety   . Cancer ([place])   . Chronic back pain 11/12/2009   Qualifier: Diagnosis of  By: Maxie Better FNP, Rosalita Levan   . COPD (chronic obstructive pulmonary disease) (HCC)    states SOB with ADLs; no O2 use; able to speak in complete sentences without SOB(11/15/2014)  . Depression   . Difficulty swallowing pills    s/p cervical fusion  . Family history of adverse reaction to anesthesia    pt's mother and sister have hx. of post-op N/V  . Fibromyalgia   . GERD (gastroesophageal reflux disease)   . History of gastric ulcer   . Hypertension    states under control with med., has been on med. x 1-2 yr.  . IBS (irritable bowel syndrome)   . Internal hemorrhoid    states has had intermittent bright red bleeding with BM (11/15/2014)  . Limited joint range of motion    neck - s/p cervical fusion  . Localized primary osteoarthritis of right shoulder region 11/23/2014  . Osteoarthritis of left shoulder 07/05/2015  . Osteoarthritis of right shoulder 11/2014  . Seizures ([place])    last seizure 2010  . Short-term memory loss   . TMJ syndrome   . Valvular heart disease    Initial workup a number of years ago at [facility].  Echo (10/2009) showed EF 60-65%,      normal LV size, moderate aortic insufficiency with a trileaflet aortic valve and normal aortic root size.  Mild      mitral regurgitation.   . Wears dentures    lower    HOSPITAL COURSE:  57 year old female with history  of chronic back pain, COPD and tobacco dependence who presents to the emergency room due to a fall and anemia.  1.  Acute blood loss anemia from presumed upper GI bleed She received 1 unit PRBC.  Her hemoglobin has remained stable.  EGD showed gastric ulcer nonbleeding.  She underwent colonoscopy.  She will have follow-up with GI for biopsy results.  She will continue PPI twice daily.  She is asked to avoid NSAIDs including BC powder as well. Colonoscopy showed polyp which was removed    2.Tobacco dependence: Patient is encouraged to quit smoking. Counseling was provided.  3.  Essential hypertension: Continue Hyzaar.  4.  Anxiety/depression: Continue Paxil and Xanax      DISCHARGE CONDITIONS AND DIET:   Stable Regular diet  CONSULTS OBTAINED:  Treatment Team:  Jonathon Bellows, MD  DRUG ALLERGIES:   Allergies  Allergen Reactions  . Carbamazepine Hives  . Divalproex Sodium Swelling    HAIR FALLS OUT  . Clonazepam Other (See Comments)    HALLUCINATIONS HALLUCINATIONS  . Divalproex Sodium Swelling    HAIR FALLS OUT  . Diphenhydramine Hcl Other (See Comments)    UNKNOWN  . Diphenhydramine Hcl Rash    UNKNOWN  . Tiotropium Other (See Comments)    CREATES YEAST IN THROAT  . Tiotropium Bromide Monohydrate Other (  See Comments)    CREATES YEAST IN THROAT  . Vicodin [Hydrocodone-Acetaminophen] Itching    DISCHARGE MEDICATIONS:   Allergies as of 06/15/2018      Reactions   Carbamazepine Hives   Divalproex Sodium Swelling   HAIR FALLS OUT   Clonazepam Other (See Comments)   HALLUCINATIONS HALLUCINATIONS   Divalproex Sodium Swelling   HAIR FALLS OUT   Diphenhydramine Hcl Other (See Comments)   UNKNOWN   Diphenhydramine Hcl Rash   UNKNOWN   Tiotropium Other (See Comments)   CREATES YEAST IN THROAT   Tiotropium Bromide Monohydrate Other (See Comments)   CREATES YEAST IN THROAT   Vicodin [hydrocodone-acetaminophen] Itching      Medication List    STOP taking  these medications   albuterol 108 (90 Base) MCG/ACT inhaler Commonly known as:  PROVENTIL HFA;VENTOLIN HFA   estradiol 0.1 MG/GM vaginal cream Commonly known as:  ESTRACE VAGINAL   Vitamin D3 2000 units capsule     TAKE these medications   ALPRAZolam 1 MG tablet Commonly known as:  XANAX TAKE 1 TABLET THREE TIMES DAILY AS NEEDED FOR ANXIETY   gabapentin 300 MG capsule Commonly known as:  NEURONTIN Take 4 capsules (1,200 mg total) by mouth at bedtime.   lamoTRIgine 100 MG tablet Commonly known as:  LAMICTAL TAKE 1 TABLET TWICE DAILY   losartan-hydrochlorothiazide 100-12.5 MG tablet Commonly known as:  HYZAAR TAKE 1 TABLET EVERY DAY   nicotine 14 mg/24hr patch Commonly known as:  NICODERM CQ - dosed in mg/24 hours Place 1 patch (14 mg total) onto the skin daily.   pantoprazole 40 MG tablet Commonly known as:  PROTONIX Take 1 tablet (40 mg total) by mouth 2 (two) times daily.   PARoxetine 20 MG tablet Commonly known as:  PAXIL TAKE 1 TABLET EVERY MORNING         Today   CHIEF COMPLAINT:  Patient tolerated her bowel prep.  No melena or hematochezia.  No abdominal pain or nausea.   VITAL SIGNS:  Blood pressure (!) 153/92, pulse 75, temperature 97.9 F (36.6 C), temperature source Oral, resp. rate 20, height 5\' 1"  (1.549 m), weight 95 lb (43.1 kg), SpO2 95 %.   REVIEW OF SYSTEMS:  Review of Systems  Constitutional: Negative.  Negative for chills, fever and malaise/fatigue.  HENT: Negative.  Negative for ear discharge, ear pain, hearing loss, nosebleeds and sore throat.   Eyes: Negative.  Negative for blurred vision and pain.  Respiratory: Negative.  Negative for cough, hemoptysis, shortness of breath and wheezing.   Cardiovascular: Negative.  Negative for chest pain, palpitations and leg swelling.  Gastrointestinal: Negative.  Negative for abdominal pain, blood in stool, diarrhea, nausea and vomiting.  Genitourinary: Negative.  Negative for dysuria.   Musculoskeletal: Negative.  Negative for back pain.  Skin: Negative.   Neurological: Negative for dizziness, tremors, speech change, focal weakness, seizures and headaches.  Endo/Heme/Allergies: Negative.  Does not bruise/bleed easily.  Psychiatric/Behavioral: Negative.  Negative for depression, hallucinations and suicidal ideas.     PHYSICAL EXAMINATION:  GENERAL:  57 y.o.-year-old patient lying in the bed with no acute distress.  NECK:  Supple, no jugular venous distention. No thyroid enlargement, no tenderness.  LUNGS: Normal breath sounds bilaterally, no wheezing, rales,rhonchi  No use of accessory muscles of respiration.  CARDIOVASCULAR: S1, S2 normal. No murmurs, rubs, or gallops.  ABDOMEN: Soft, non-tender, non-distended. Bowel sounds present. No organomegaly or mass.  EXTREMITIES: No pedal edema, cyanosis, or clubbing.  PSYCHIATRIC: The patient is  alert and oriented x 3.  SKIN: No obvious rash, lesion, or ulcer.   DATA REVIEW:   CBC Recent Labs  Lab 06/15/18 0408  WBC 5.4  HGB 9.8*  HCT 30.6*  PLT 409    Chemistries  Recent Labs  Lab 06/13/18 1646  06/15/18 0408  NA  --    < > 138  K  --    < > 3.9  CL  --    < > 104  CO2  --    < > 25  GLUCOSE  --    < > 83  BUN  --    < > 5*  CREATININE  --    < > 0.83  CALCIUM  --    < > 9.3  MG 1.9  --   --    < > = values in this interval not displayed.    Cardiac Enzymes Recent Labs  Lab 06/13/18 1337  TROPONINI <0.03    Microbiology Results  @[email or web address] @  RADIOLOGY:  Dg Cervical Spine Complete  Result Date: 06/13/2018 CLINICAL DATA:  Neck pain and right arm numbness. [place] out of bed last week. Prior cervical fusion. EXAM: CERVICAL SPINE - COMPLETE 4+ VIEW COMPARISON:  Cervical spine radiographs 04/25/2015. Soft tissue neck CT 12/06/2017. FINDINGS: Grade 1 anterolisthesis of C3 on C4 and C7 on T1 is unchanged from the prior radiographs. Sequelae of C4-C7 ACDF are again identified with unchanged appearance of  the anterior fusion plate and screws. Bulky anterior vertebral osteophyte formation at C3-4 is unchanged. Mild C3-4 disc space narrowing is new from 2016. C7-T1 disc space narrowing has not significantly changed. Moderate to severe facet arthrosis is noted at C2-3, C3-4, and C7-T1. Multilevel osseous neural foraminal stenosis is again seen due to uncovertebral spurring and facet arthrosis, less well evaluated than on the prior radiographs due to suboptimal positioning though with likely severe right-sided neural foraminal stenosis at C6-7. No acute fracture is identified. The prevertebral soft tissues are within normal limits. The visualized lung apices are clear. IMPRESSION: 1. No acute osseous abnormality identified. 2. Stable appearance of C4-C7 ACDF. 3. Progressive C3-4 disc space narrowing from 2016. 4. Moderate to severe multilevel facet arthrosis. Electronically Signed   By: Logan Bores M.D.   On: 06/13/2018 14:35   Ct Head Wo Contrast  Result Date: 06/13/2018 CLINICAL DATA:  [place] off of couch 2 weeks ago. Pain. Headache. More recent fall, hitting top of head. EXAM: CT HEAD WITHOUT CONTRAST TECHNIQUE: Contiguous axial images were obtained from the base of the skull through the vertex without intravenous contrast. COMPARISON:  CT head 01/11/2018.  MR head 03/20/2009. FINDINGS: Brain: No evidence for acute infarction, hemorrhage, mass lesion, hydrocephalus, or extra-axial fluid. Premature for age atrophy. Extensive hypoattenuation throughout the white matter, consistent with small vessel disease, also premature for age. Remote LEFT basal ganglia lacunar infarct, was chronic in 2010. Vascular: Calcification of the cavernous internal carotid arteries consistent with cerebrovascular atherosclerotic disease. No signs of intracranial large vessel occlusion. Skull: Calvarium intact. Sinuses/Orbits: Clear sinuses.  No orbital findings of significance. Other: Advanced LEFT TMJ arthritis. Compared to priors, similar  appearance. Previously noted scalp hematoma is improved. IMPRESSION: Atrophy and small vessel disease. Evidence for remote LEFT basal ganglia infarct. No acute intracranial findings. Similar appearance to 01/11/2018. No posttraumatic sequelae are evident. Electronically Signed   By: Staci Righter M.D.   On: 06/13/2018 14:07   Ct Angio Chest/abd/pel For Dissection W And/or Wo Contrast  Result Date: 06/13/2018  CLINICAL DATA:  Chest pain EXAM: CT ANGIOGRAPHY CHEST, ABDOMEN AND PELVIS TECHNIQUE: Multidetector CT imaging through the chest, abdomen and pelvis was performed using the standard protocol during bolus administration of intravenous contrast. Multiplanar reconstructed images and MIPs were obtained and reviewed to evaluate the vascular anatomy. CONTRAST:  35mL ISOVUE-370 IOPAMIDOL (ISOVUE-370) INJECTION 76% COMPARISON:  02/02/2018.  06/26/2011. FINDINGS: CTA CHEST FINDINGS Cardiovascular: Atherosclerotic calcifications of the thoracic aorta are noted. This includes the aortic arch. Moderate 3 vessel coronary artery calcification. There is no evidence of acute intramural hematoma on noncontrast images. There is no evidence of aortic dissection or aneurysm. The thoracic aorta is patent with scattered atherosclerotic calcifications and smooth plaque in the descending thoracic aorta. Great vessels are patent within the confines of the examination. Vertebral arteries are also patent. Mediastinum/Nodes: No abnormal mediastinal adenopathy. Thyroid is atrophic. No pericardial effusion. Lungs/Pleura: Advanced emphysema towards the lung apices which is panlobular. Stable 3 mm left lower lobe pulmonary nodule on image 98 of series 7. Stable 3 mm left upper lobe pulmonary nodule on image 60. Musculoskeletal: No vertebral compression deformity. Right shoulder arthroplasty is in place. No evidence of acute rib fracture. No destructive bone lesion. Review of the MIP images confirms the above findings. CTA ABDOMEN AND PELVIS  FINDINGS VASCULAR Aorta: Nonaneurysmal and patent with scattered atherosclerotic calcifications and soft plaque. No evidence of aortic dissection. Celiac: Patent. SMA: Patent. Renals: 2 right renal arteries are patent. There are atherosclerotic changes at the origin of the left renal artery. Exact degree of narrowing is difficult. IMA: Diminutive and patent. Inflow: There is smooth soft plaque as well as atherosclerotic calcified plaque in both common iliac arteries without aneurysmal dilatation, significant narrowing, or dissection. Scattered atherosclerotic changes of the bilateral internal and external iliac arteries are also present without evidence of significant narrowing or dissection. Review of the MIP images confirms the above findings. NON-VASCULAR Hepatobiliary: Gallbladder and liver are within normal limits for arterial phase imaging. Pancreas: Unremarkable Spleen: Unremarkable Adrenals/Urinary Tract: Prominence of the adrenal glands in a hyperplasia pattern is stable. Kidneys are within normal limits bilaterally. Stomach/Bowel: Stomach and duodenum are within normal limits. There is no dilatation of bowel to suggest obstruction. No obvious mass in the visualized colon. Lymphatic: No abnormal retroperitoneal adenopathy Reproductive: Uterus is absent.  Adnexa are unremarkable. Other: No free fluid. Musculoskeletal: No vertebral compression deformity. L3 through S1 fusion hardware is in place. Review of the MIP images confirms the above findings. IMPRESSION: Vascular: No evidence of aortic dissection or intramural hematoma. The abdominal aorta is nonaneurysmal and patent. Nonvascular: Stable 3 mm pulmonary nodules in the left lung supporting benign etiology. No acute intra thoracic or intra-abdominal pathology. Aortic Atherosclerosis (ICD10-I70.0) and Emphysema (ICD10-J43.9). Electronically Signed   By: Marybelle Killings M.D.   On: 06/13/2018 15:32      Allergies as of 06/15/2018      Reactions    Carbamazepine Hives   Divalproex Sodium Swelling   HAIR FALLS OUT   Clonazepam Other (See Comments)   HALLUCINATIONS HALLUCINATIONS   Divalproex Sodium Swelling   HAIR FALLS OUT   Diphenhydramine Hcl Other (See Comments)   UNKNOWN   Diphenhydramine Hcl Rash   UNKNOWN   Tiotropium Other (See Comments)   CREATES YEAST IN THROAT   Tiotropium Bromide Monohydrate Other (See Comments)   CREATES YEAST IN THROAT   Vicodin [hydrocodone-acetaminophen] Itching      Medication List    STOP taking these medications   albuterol 108 (90 Base) MCG/ACT inhaler  Commonly known as:  PROVENTIL HFA;VENTOLIN HFA   estradiol 0.1 MG/GM vaginal cream Commonly known as:  ESTRACE VAGINAL   Vitamin D3 2000 units capsule     TAKE these medications   ALPRAZolam 1 MG tablet Commonly known as:  XANAX TAKE 1 TABLET THREE TIMES DAILY AS NEEDED FOR ANXIETY   gabapentin 300 MG capsule Commonly known as:  NEURONTIN Take 4 capsules (1,200 mg total) by mouth at bedtime.   lamoTRIgine 100 MG tablet Commonly known as:  LAMICTAL TAKE 1 TABLET TWICE DAILY   losartan-hydrochlorothiazide 100-12.5 MG tablet Commonly known as:  HYZAAR TAKE 1 TABLET EVERY DAY   nicotine 14 mg/24hr patch Commonly known as:  NICODERM CQ - dosed in mg/24 hours Place 1 patch (14 mg total) onto the skin daily.   pantoprazole 40 MG tablet Commonly known as:  PROTONIX Take 1 tablet (40 mg total) by mouth 2 (two) times daily.   PARoxetine 20 MG tablet Commonly known as:  PAXIL TAKE 1 TABLET EVERY MORNING          Management plans discussed with the patient and she is in agreement. Stable for discharge   Patient should follow up with pcp  CODE STATUS:     Code Status Orders  (From admission, onward)        Start     Ordered   06/14/18 0959  Do not attempt resuscitation (DNR)  Continuous    Question Answer Comment  In the event of cardiac or respiratory ARREST Do not call a "code blue"   In the event of  cardiac or respiratory ARREST Do not perform Intubation, CPR, defibrillation or ACLS   In the event of cardiac or respiratory ARREST Use medication by any route, position, wound care, and other measures to relive pain and suffering. May use oxygen, suction and manual treatment of airway obstruction as needed for comfort.      06/14/18 0958    Code Status History    Date Active Date Inactive Code Status Order ID Comments User Context   06/13/2018 1621 06/14/2018 0958 Full Code [id number]  Demetrios Loll, MD ED   07/05/2015 1610 07/06/2015 1042 Full Code [id number]  Marchia Bond, MD Inpatient   11/23/2014 1425 11/24/2014 1012 Full Code [id number]  Johnny Bridge, MD Inpatient      TOTAL TIME TAKING CARE OF THIS PATIENT: 38 minutes.    Note: This dictation was prepared with Dragon dictation along with smaller phrase technology. Any transcriptional errors that result from this process are unintentional.  Alohilani Levenhagen M.D on 06/15/2018 at 9:20 AM  Between 7am to 6pm - Pager - [phone] After 6pm go to [email or web address] - password [email or web address] [facility]  Office  [phone]  CC: Primary care physician; Venia Carbon, MD

## 2018-06-15 NOTE — Progress Notes (Signed)
Initial Nutrition Assessment  DOCUMENTATION CODES:   Severe malnutrition in context of chronic illness  INTERVENTION:   Recommend Ensure Enlive po BID when diet advanced, each supplement provides 350 kcal and 20 grams of protein  Recommend daily MVI  Recommend check iron, ferritin, transferrin, TIBC labs   NUTRITION DIAGNOSIS:   Severe Malnutrition related to chronic illness(COPD, peptic ulcer disease, IBS) as evidenced by severe fat depletion, severe muscle depletion.  GOAL:   Patient will meet greater than or equal to 90% of their needs  MONITOR:   PO intake, Supplement acceptance, Weight trends, Labs, I & O's, Skin  REASON FOR ASSESSMENT:   Malnutrition Screening Tool    ASSESSMENT:   57 year old female with history of chronic back pain, IBS, peptic ulcer disease, COPD and tobacco dependence who presents to the emergency room due to a fall and anemia.   -pt s/p EGD 8/6 noted to have possible barrett's esophagus and gastric ulcers -pt s/p colonoscopy today; noted to have one polyp which was removed.   Met with pt in room today. Pt reports poor appetite and oral intake at baseline. Pt has h/o IBS so she avoids many foods as she reports frequent flares. Pt avoids tomatoes, milk products and anything with seeds. Pt is willing to drink nutritional supplements as long as they are lactose free. Pt has dentures but denies any trouble chewing. Pt does report trouble swallowing and food getting stuck in her throat; RD suspects this is r/t her barrett's. Per chart, pt with 8lb(8%) wt loss in 4 months; this is not significant given the time frame. Pt on clear liquid diet and NPO for colonoscopy today. Recommend Ensure and MVI when diet advanced. Pt to possible discharge home today. Pt with microcytic anemia; would recommend check iron/anemia labs to r/o deficiency as pt is malnourished.    Medications reviewed and include: nicotine, protonix, paxil, NaCl w/ KCl _0 /hr  Labs  reviewed: BUN 5(L) Hgb 9.8(L), Hct 30.6(L), MCV 71.1(L), MCH 22.7(L), MCHC 31.9(L)  NUTRITION - FOCUSED PHYSICAL EXAM:    Most Recent Value  Orbital Region  Moderate depletion  Upper Arm Region  Severe depletion  Thoracic and Lumbar Region  Severe depletion  Buccal Region  Moderate depletion  Temple Region  Moderate depletion  Clavicle Bone Region  Severe depletion  Clavicle and Acromion Bone Region  Severe depletion  Scapular Bone Region  Severe depletion  Dorsal Hand  Severe depletion  Patellar Region  Severe depletion  Anterior Thigh Region  Severe depletion  Posterior Calf Region  Severe depletion  Edema (RD Assessment)  None  Hair  Reviewed  Eyes  Reviewed  Mouth  Reviewed  Skin  Reviewed  Nails  Reviewed     Diet Order:   Diet Order           Diet NPO time specified Except for: Sips with Meds  Diet effective 0500        Diet clear liquid Room service appropriate? Yes; Fluid consistency: Thin  Diet effective now         EDUCATION NEEDS:   Education needs have been addressed  Skin:  Skin Assessment: Reviewed RN Assessment  Last BM:  8/7  Height:   Ht Readings from Last 1 Encounters:  06/13/18 _1  (1.549 m)    Weight:   Wt Readings from Last 1 Encounters:  06/15/18 96 lb 3.2 oz (43.6 kg)    Ideal Body Weight:  47.7 kg  BMI:  Body mass index is 18.18  kg/m.  Estimated Nutritional Needs:   Kcal:  1300-1500kcal/day   Protein:  65-73g/day   Fluid:  >1.3L/day   Koleen Distance MS, RD, LDN Pager #- [phone] Office#- [phone] After Hours Pager: [phone]

## 2018-06-15 NOTE — Plan of Care (Signed)

## 2018-06-15 NOTE — Anesthesia Postprocedure Evaluation (Signed)
Anesthesia Post Note  Patient: Molly Peters  Procedure(s) Performed: COLONOSCOPY (N/A )  Patient location during evaluation: Endoscopy Anesthesia Type: General Level of consciousness: awake and alert Pain management: pain level controlled Vital Signs Assessment: post-procedure vital signs reviewed and stable Respiratory status: spontaneous breathing, nonlabored ventilation and respiratory function stable Cardiovascular status: blood pressure returned to baseline and stable Postop Assessment: no apparent nausea or vomiting Anesthetic complications: no     Last Vitals:  Vitals:   06/15/18 1039 06/15/18 1249  BP: (!) 157/96 (!) 153/89  Pulse: 75 85  Resp:  15  Temp:  (!) 36.1 C  SpO2:  99%    Last Pain:  Vitals:   06/15/18 1309  TempSrc:   PainSc: 0-No pain                 Alphonsus Sias

## 2018-06-15 NOTE — Anesthesia Postprocedure Evaluation (Signed)
Anesthesia Post Note  Patient: NATIA FAHMY  Procedure(s) Performed: ESOPHAGOGASTRODUODENOSCOPY (EGD) (N/A )  Patient location during evaluation: Endoscopy Anesthesia Type: General Level of consciousness: awake and alert Pain management: pain level controlled Vital Signs Assessment: post-procedure vital signs reviewed and stable Respiratory status: spontaneous breathing, nonlabored ventilation, respiratory function stable and patient connected to nasal cannula oxygen Cardiovascular status: blood pressure returned to baseline and stable Postop Assessment: no apparent nausea or vomiting Anesthetic complications: no     Last Vitals:  Vitals:   06/15/18 1039 06/15/18 1249  BP: (!) 157/96 (!) 153/89  Pulse: 75 85  Resp:  15  Temp:  (!) 36.1 C  SpO2:  99%    Last Pain:  Vitals:   06/15/18 1309  TempSrc:   PainSc: 0-No pain                 Martha Clan

## 2018-06-16 ENCOUNTER — Telehealth: Payer: Self-pay | Admitting: *Deleted

## 2018-06-16 LAB — SURGICAL PATHOLOGY

## 2018-06-16 NOTE — Telephone Encounter (Signed)
Lm requesting return call to complete TCM and confirm hosp f/u appt  

## 2018-06-20 ENCOUNTER — Ambulatory Visit (INDEPENDENT_AMBULATORY_CARE_PROVIDER_SITE_OTHER): Payer: Medicare HMO | Admitting: Internal Medicine

## 2018-06-20 ENCOUNTER — Encounter: Payer: Self-pay | Admitting: Gastroenterology

## 2018-06-20 ENCOUNTER — Encounter: Payer: Self-pay | Admitting: Internal Medicine

## 2018-06-20 VITALS — BP 138/88 | HR 80 | Temp 98.2°F | Ht 59.25 in | Wt 94.5 lb

## 2018-06-20 DIAGNOSIS — D62 Acute posthemorrhagic anemia: Secondary | ICD-10-CM

## 2018-06-20 DIAGNOSIS — K922 Gastrointestinal hemorrhage, unspecified: Secondary | ICD-10-CM

## 2018-06-20 NOTE — Progress Notes (Signed)
Subjective:    Patient ID: Molly Peters, female    DOB: Apr 08, 1961, 57 y.o.   MRN: [id number]  HPI Here with husband for hospital follow up  Got turned around in bed--fell getting out of bed and twisted head backi Some pain in head and neck/shoulders ("massive headache") Finally got so bad and some chest pain Husband brought her Noted to be very anemia---surprise  No black stool--but dark and mucousy at times Some nausea/vomiting and diarrhea This went back 4 days before the ER visit Felt like "something made me sick" Nausea meds did help some  EGD--showed ulcers Reviewed pathology Did get transfused x 1 Now on protonix bid Nausea is better---though slight nausea if really hungry (hypoglycemia?) No black stools  Current Outpatient Medications on File Prior to Visit  Medication Sig Dispense Refill  . ALPRAZolam (XANAX) 1 MG tablet TAKE 1 TABLET THREE TIMES DAILY AS NEEDED FOR ANXIETY 270 tablet 0  . gabapentin (NEURONTIN) 300 MG capsule Take 4 capsules (1,200 mg total) by mouth at bedtime.    . lamoTRIgine (LAMICTAL) 100 MG tablet TAKE 1 TABLET TWICE DAILY 180 tablet 1  . losartan-hydrochlorothiazide (HYZAAR) 100-12.5 MG tablet TAKE 1 TABLET EVERY DAY 90 tablet 1  . pantoprazole (PROTONIX) 40 MG tablet Take 1 tablet (40 mg total) by mouth 2 (two) times daily. 60 tablet 1  . PARoxetine (PAXIL) 20 MG tablet TAKE 1 TABLET EVERY MORNING 90 tablet 3  . nicotine (NICODERM CQ - DOSED IN MG/24 HOURS) 14 mg/24hr patch Place 1 patch (14 mg total) onto the skin daily. (Patient not taking: Reported on 06/20/2018) 28 patch 0   No current facility-administered medications on file prior to visit.     Allergies  Allergen Reactions  . Carbamazepine Hives  . Divalproex Sodium Swelling    HAIR FALLS OUT  . Clonazepam Other (See Comments)    HALLUCINATIONS HALLUCINATIONS  . Divalproex Sodium Swelling    HAIR FALLS OUT  . Diphenhydramine Hcl Other (See Comments)    UNKNOWN  .  Diphenhydramine Hcl Rash    UNKNOWN  . Tiotropium Other (See Comments)    CREATES YEAST IN THROAT  . Tiotropium Bromide Monohydrate Other (See Comments)    CREATES YEAST IN THROAT  . Vicodin [Hydrocodone-Acetaminophen] Itching    Past Medical History:  Diagnosis Date  . Anxiety   . Cancer ([place])   . Chronic back pain 11/12/2009   Qualifier: Diagnosis of  By: Maxie Better FNP, Rosalita Levan   . COPD (chronic obstructive pulmonary disease) ([facility]CC)    states SOB with ADLs; no O2 use; able to speak in complete sentences without SOB(11/15/2014)  . Depression   . Difficulty swallowing pills    s/p cervical fusion  . Family history of adverse reaction to anesthesia    pt's mother and sister have hx. of post-op N/V  . Fibromyalgia   . GERD (gastroesophageal reflux disease)   . History of gastric ulcer   . Hypertension    states under control with med., has been on med. x 1-2 yr.  . IBS (irritable bowel syndrome)   . Internal hemorrhoid    states has had intermittent bright red bleeding with BM (11/15/2014)  . Limited joint range of motion    neck - s/p cervical fusion  . Localized primary osteoarthritis of right shoulder region 11/23/2014  . Osteoarthritis of left shoulder 07/05/2015  . Osteoarthritis of right shoulder 11/2014  . Seizures ([place])    last seizure 2010  . Short-term  memory loss   . TMJ syndrome   . Valvular heart disease    Initial workup a number of years ago at [facility].  Echo (10/2009) showed EF 60-65%,      normal LV size, moderate aortic insufficiency with a trileaflet aortic valve and normal aortic root size.  Mild      mitral regurgitation.   . Wears dentures    lower    Past Surgical History:  Procedure Laterality Date  . ABDOMINAL HYSTERECTOMY     partial  . ANTERIOR CERVICAL DECOMP/DISCECTOMY FUSION  02/06/2011   C4-5, C5-6, C6-7  . BREAST LUMPECTOMY Bilateral 1989   x 3 - benign  . CARDIAC CATHETERIZATION  1995  . CESAREAN SECTION     x 2  . COLONOSCOPY  09/28/2008   . COLONOSCOPY N/A 06/15/2018   Procedure: COLONOSCOPY;  Surgeon: Virgel Manifold, MD;  Location: [facility] ENDOSCOPY;  Service: Endoscopy;  Laterality: N/A;  . ESOPHAGOGASTRODUODENOSCOPY  09/28/2008  . ESOPHAGOGASTRODUODENOSCOPY N/A 06/14/2018   Procedure: ESOPHAGOGASTRODUODENOSCOPY (EGD);  Surgeon: Virgel Manifold, MD;  Location: [facility] ENDOSCOPY;  Service: Endoscopy;  Laterality: N/A;  . HARDWARE REMOVAL  11/17/2006   L5-S1  . LAMINECTOMY WITH POSTERIOR LATERAL ARTHRODESIS LEVEL 3  12/06/2009   with synovial cyst resection L3-4 bilat.  Marland Kitchen LAMINECTOMY WITH POSTERIOR LATERAL ARTHRODESIS LEVEL 3  11/17/2006   L4-5  . NM MYOVIEW LTD     Lexiscan myoview (10/2009): EF 77%, normal wall motion, normal perfusion.   Marland Kitchen SHOULDER ARTHROSCOPY WITH DEBRIDEMENT AND BICEP TENDON REPAIR Right 04/13/2014   Procedure: RIGHT SHOULDER ARTHROSCOPY WITH DEBRIDEMENT EXTENSIVE;  Surgeon: Johnny Bridge, MD;  Location: [place];  Service: Orthopedics;  Laterality: Right;  . TOTAL SHOULDER ARTHROPLASTY Right 11/23/2014   Procedure: TOTAL RIGHT SHOULDER ARTHROPLASTY;  Surgeon: Johnny Bridge, MD;  Location: [place];  Service: Orthopedics;  Laterality: Right;  . TOTAL SHOULDER ARTHROPLASTY Left 07/05/2015   Procedure: LEFT TOTAL SHOULDER REPLACEMENT;  Surgeon: Marchia Bond, MD;  Location: [place];  Service: Orthopedics;  Laterality: Left;    Family History  Problem Relation Age of Onset  . Cervical cancer Mother   . Anesthesia problems Mother        post-op N/V  . Rheum arthritis Mother   . Anxiety disorder Mother   . Healthy Father   . Cancer Father        colon cancer  . Migraines Sister   . Cervical cancer Sister   . Anesthesia problems Sister        post-op N/V  . Migraines Brother   . Asthma Daughter   . Coronary artery disease Maternal Grandmother   . Hypertension Maternal Grandmother   . Diabetes Maternal Grandmother   . Coronary artery disease  Paternal Grandmother   . Hypertension Paternal Grandmother   . Diabetes Paternal Grandmother     Social History   Socioeconomic History  . Marital status: Married    Spouse name: Not on file  . Number of children: 3  . Years of education: Not on file  . Highest education level: Not on file  Occupational History  . Occupation: Disabled 2003  Social Needs  . Financial resource strain: Not on file  . Food insecurity:    Worry: Not on file    Inability: Not on file  . Transportation needs:    Medical: Not on file    Non-medical: Not on file  Tobacco Use  . Smoking status: Current  Every Day Smoker    Packs/day: 1.50    Years: 42.00    Pack years: 63.00    Types: Cigarettes  . Smokeless tobacco: Never Used  Substance and Sexual Activity  . Alcohol use: No    Alcohol/week: 0.0 standard drinks    Comment: occasionally  . Drug use: No  . Sexual activity: Not on file  Lifestyle  . Physical activity:    Days per week: Not on file    Minutes per session: Not on file  . Stress: Not on file  Relationships  . Social connections:    Talks on phone: Not on file    Gets together: Not on file    Attends religious service: Not on file    Active member of club or organization: Not on file    Attends meetings of clubs or organizations: Not on file    Relationship status: Not on file  . Intimate partner violence:    Fear of current or ex partner: Not on file    Emotionally abused: Not on file    Physically abused: Not on file    Forced sexual activity: Not on file  Other Topics Concern  . Not on file  Social History Narrative   No living will   Husband, then oldest son Roosvelt Harps, should make decisions   Would accept resuscitation   No tube feeds    Review of Systems  Energy levels are not great Sleeping okay now No sig SOB. Still with regular dry cough Smoking some--wants to get more nicotine patches     Objective:   Physical Exam  Constitutional: No distress.    Neck: No thyromegaly present.  Respiratory: Effort normal. No respiratory distress. She has no wheezes. She has no rales.  Decreased breath sounds but clear  GI: Soft. Bowel sounds are normal. She exhibits no distension. There is no rebound and no guarding.  Moderate epigastric tenderness Slight LLQ tenderness  Musculoskeletal: She exhibits no edema.  Lymphadenopathy:    She has no cervical adenopathy.           Assessment & Plan:

## 2018-06-20 NOTE — Assessment & Plan Note (Signed)
Only source is gastric ulcer Some better with the PPI bid Likely was a slow chronic bleed since never saw blood

## 2018-06-20 NOTE — Assessment & Plan Note (Addendum)
Will recheck CBC and iron studies Will likely need iron-but need to be careful due to current sensitive stomach and constipation

## 2018-06-21 ENCOUNTER — Other Ambulatory Visit: Payer: Self-pay

## 2018-06-21 LAB — CBC
HCT: 29.4 % — ABNORMAL LOW (ref 36.0–46.0)
Hemoglobin: 9.6 g/dL — ABNORMAL LOW (ref 12.0–15.0)
MCHC: 32.5 g/dL (ref 30.0–36.0)
MCV: 70.5 fl — AB (ref 78.0–100.0)
PLATELETS: 396 10*3/uL (ref 150.0–400.0)
RBC: 4.17 Mil/uL (ref 3.87–5.11)
RDW: 21.4 % — ABNORMAL HIGH (ref 11.5–15.5)
WBC: 9.2 10*3/uL (ref 4.0–10.5)

## 2018-06-21 LAB — IRON,TIBC AND FERRITIN PANEL
%SAT: 11 % — AB (ref 16–45)
FERRITIN: 9 ng/mL — AB (ref 16–232)
Iron: 45 ug/dL (ref 45–160)
TIBC: 393 ug/dL (ref 250–450)

## 2018-06-21 NOTE — Patient Outreach (Signed)
[facility]) Care Management  06/21/2018  Molly Peters Feb 08, 1961 [id number]  EMMI: general discharge red alert. Referral date: 06/21/18 Referral reason: lost interest in things: yes Insurance: [employer] Day # 4  Telephone call to patient regarding EMMI general discharge red alert. HIPAA verified with patient. Explained reason for call.  Patient states she does have some anxiety/ depression and is taking medication for it.  Patient states she deals with this everyday. RNCM discussed and offered [facility] care management services to patient. Patient declined.  Patient state she has had this for 30 years and this is not knew to new to her.   Patient reports she saw her primary MD on yesterday. She denies any changes in her treatment plan / medications. Patient states she is taking her medications as prescribed. Patient reports she has a follow up with the gastroenterologist on 06/28/18.  Patient denies any chest pain.  Patient states she had lost approximately 8 lbs while she has been sick. Patient states she is eating 3 meals a day now and her appetite has gotten a little better. Patient states her husband makes sure she is eating. Patient denies any further needs at this time.  RNCM advised patient to notify MD of any changes in condition prior to scheduled appointment. RNCM provided contact name and number: [phone] or main office number [phone] and 24 hour nurse advise line [phone].  RNCM verified patient aware of 911 services for urgent/ emergent needs.  PLAN:  RNCM will close patient due to patient refusing [facility] management services.  RNCM will send patient [place] management brochure/ magnet RNCM will send closure notification to patients primary md.  Quinn Plowman RN,BSN,[facility] Telephonic  [phone]

## 2018-06-22 ENCOUNTER — Emergency Department
Admission: EM | Admit: 2018-06-22 | Discharge: 2018-06-22 | Disposition: A | Discharge status: Home / Self Care | Payer: Medicare HMO | Attending: Emergency Medicine | Admitting: Emergency Medicine

## 2018-06-22 ENCOUNTER — Emergency Department: Payer: Medicare HMO

## 2018-06-22 ENCOUNTER — Other Ambulatory Visit: Payer: Self-pay

## 2018-06-22 ENCOUNTER — Encounter: Payer: Self-pay | Admitting: Emergency Medicine

## 2018-06-22 DIAGNOSIS — R0781 Pleurodynia: Secondary | ICD-10-CM | POA: Diagnosis not present

## 2018-06-22 DIAGNOSIS — Z7902 Long term (current) use of antithrombotics/antiplatelets: Secondary | ICD-10-CM | POA: Insufficient documentation

## 2018-06-22 DIAGNOSIS — M62830 Muscle spasm of back: Secondary | ICD-10-CM | POA: Diagnosis not present

## 2018-06-22 DIAGNOSIS — F17210 Nicotine dependence, cigarettes, uncomplicated: Secondary | ICD-10-CM | POA: Insufficient documentation

## 2018-06-22 DIAGNOSIS — I10 Essential (primary) hypertension: Secondary | ICD-10-CM | POA: Insufficient documentation

## 2018-06-22 DIAGNOSIS — Z79899 Other long term (current) drug therapy: Secondary | ICD-10-CM | POA: Insufficient documentation

## 2018-06-22 DIAGNOSIS — R0602 Shortness of breath: Secondary | ICD-10-CM | POA: Diagnosis not present

## 2018-06-22 DIAGNOSIS — R079 Chest pain, unspecified: Secondary | ICD-10-CM | POA: Diagnosis not present

## 2018-06-22 DIAGNOSIS — J449 Chronic obstructive pulmonary disease, unspecified: Secondary | ICD-10-CM | POA: Diagnosis not present

## 2018-06-22 DIAGNOSIS — M549 Dorsalgia, unspecified: Secondary | ICD-10-CM | POA: Diagnosis present

## 2018-06-22 LAB — URINALYSIS, COMPLETE (UACMP) WITH MICROSCOPIC
Bilirubin Urine: NEGATIVE
Glucose, UA: NEGATIVE mg/dL
Ketones, ur: NEGATIVE mg/dL
Leukocytes, UA: NEGATIVE
Nitrite: NEGATIVE
PH: 7 (ref 5.0–8.0)
Protein, ur: NEGATIVE mg/dL
SPECIFIC GRAVITY, URINE: 1.004 — AB (ref 1.005–1.030)

## 2018-06-22 LAB — BASIC METABOLIC PANEL
Anion gap: 11 (ref 5–15)
BUN: 8 mg/dL (ref 6–20)
CALCIUM: 9.4 mg/dL (ref 8.9–10.3)
CHLORIDE: 100 mmol/L (ref 98–111)
CO2: 26 mmol/L (ref 22–32)
CREATININE: 0.84 mg/dL (ref 0.44–1.00)
GFR calc non Af Amer: >60 mL/min (ref >60)
GLUCOSE: 74 mg/dL (ref 70–99)
Potassium: 3.7 mmol/L (ref 3.5–5.1)
Sodium: 137 mmol/L (ref 135–145)

## 2018-06-22 LAB — CBC
HCT: 32.8 % — ABNORMAL LOW (ref 35.0–47.0)
Hemoglobin: 10.4 g/dL — ABNORMAL LOW (ref 12.0–16.0)
MCH: 22.3 pg — AB (ref 26.0–34.0)
MCHC: 31.7 g/dL — AB (ref 32.0–36.0)
MCV: 70.4 fL — AB (ref 80.0–100.0)
PLATELETS: 425 10*3/uL (ref 150–440)
RBC: 4.65 MIL/uL (ref 3.80–5.20)
RDW: 21.8 % — ABNORMAL HIGH (ref 11.5–14.5)
WBC: 11.2 10*3/uL — ABNORMAL HIGH (ref 3.6–11.0)

## 2018-06-22 MED ORDER — KETOROLAC TROMETHAMINE 30 MG/ML IJ SOLN
15.0000 mg | INTRAMUSCULAR | Status: AC
  Administered 2018-06-22: 15 mg via INTRAVENOUS
  Filled 2018-06-22: qty 1

## 2018-06-22 MED ORDER — IOPAMIDOL (ISOVUE-370) INJECTION 76%
75.0000 mL | Freq: Once | INTRAVENOUS | Status: AC | PRN
  Administered 2018-06-22: 60 mL via INTRAVENOUS

## 2018-06-22 MED ORDER — TRAMADOL HCL 50 MG PO TABS
50.0000 mg | ORAL_TABLET | Freq: Once | ORAL | Status: AC
  Administered 2018-06-22: 50 mg via ORAL
  Filled 2018-06-22: qty 1

## 2018-06-22 MED ORDER — LIDOCAINE 5 % EX PTCH
MEDICATED_PATCH | CUTANEOUS | Status: AC
  Administered 2018-06-22: 1 via TRANSDERMAL
  Filled 2018-06-22: qty 1

## 2018-06-22 MED ORDER — SODIUM CHLORIDE 0.9 % IV BOLUS
1000.0000 mL | Freq: Once | INTRAVENOUS | Status: AC
  Administered 2018-06-22: 1000 mL via INTRAVENOUS

## 2018-06-22 MED ORDER — LORAZEPAM 2 MG/ML IJ SOLN
1.0000 mg | Freq: Once | INTRAMUSCULAR | Status: AC
  Administered 2018-06-22: 1 mg via INTRAVENOUS
  Filled 2018-06-22: qty 1

## 2018-06-22 MED ORDER — LIDOCAINE 5 % EX PTCH
1.0000 | MEDICATED_PATCH | CUTANEOUS | Status: DC
  Administered 2018-06-22: 1 via TRANSDERMAL

## 2018-06-22 MED ORDER — NAPROXEN 500 MG PO TABS: 500.0000 mg | ORAL_TABLET | Freq: Two times a day (BID) | ORAL | 0 refills | Status: DC

## 2018-06-22 NOTE — ED Notes (Signed)
Pt c/o left lateral rib/flank pain that started a little yesterday evening , states when she woke around 5am the pain was severe, hurts to move or breath. Pt has a congested cough, lung sounds are clear throughout. Denies and pain urination. Pt had a recent admission with a blood transfusion, states they showed two ulcers that were healing.

## 2018-06-22 NOTE — ED Provider Notes (Signed)
[facility] Emergency Department Provider Note  ____________________________________________  Time seen: Approximately 4:20 PM  I have reviewed the triage vital signs and the nursing notes.   HISTORY  Chief Complaint No chief complaint on file.    HPI Molly Peters is a 57 y.o. female with a history of cancer, hypertension, seizures, anxiety and chronic pain who complains of left upper back pain that started gradually yesterday afternoon.  Constant, waxing and waning, worse with movement.  She is able to sleep last night when she woke up this morning it was much more severe.  Denies any recent heavy lifting or strenuous exertion.  Denies any recent trauma or falls.  No paresthesias.  Denies chest pain or shortness of breath, but the pain is worse with deep breathing.  It is located in the area between the spine and the shoulder blade on her left back.  No alleviating factors.  Severe intensity    Past Medical History:  Diagnosis Date  . Anxiety   . Cancer ([place])   . Chronic back pain 11/12/2009   Qualifier: Diagnosis of  By: Maxie Better FNP, Rosalita Levan   . COPD (chronic obstructive pulmonary disease) (HCC)    states SOB with ADLs; no O2 use; able to speak in complete sentences without SOB(11/15/2014)  . Depression   . Difficulty swallowing pills    s/p cervical fusion  . Family history of adverse reaction to anesthesia    pt's mother and sister have hx. of post-op N/V  . Fibromyalgia   . GERD (gastroesophageal reflux disease)   . History of gastric ulcer   . Hypertension    states under control with med., has been on med. x 1-2 yr.  . IBS (irritable bowel syndrome)   . Internal hemorrhoid    states has had intermittent bright red bleeding with BM (11/15/2014)  . Limited joint range of motion    neck - s/p cervical fusion  . Localized primary osteoarthritis of right shoulder region 11/23/2014  . Osteoarthritis of left shoulder 07/05/2015  .  Osteoarthritis of right shoulder 11/2014  . Seizures ([place])    last seizure 2010  . Short-term memory loss   . TMJ syndrome   . Valvular heart disease    Initial workup a number of years ago at [facility].  Echo (10/2009) showed EF 60-65%,      normal LV size, moderate aortic insufficiency with a trileaflet aortic valve and normal aortic root size.  Mild      mitral regurgitation.   . Wears dentures    lower     Patient Active Problem List   Diagnosis Date Noted  . Microcytic anemia   . Polyp of sigmoid colon   . Columnar epithelial-lined lower esophagus   . Acute peptic ulcer of stomach   . Acute posthemorrhagic anemia   . GIB (gastrointestinal bleeding) 06/13/2018  . Hoarseness 10/27/2017  . Advance directive discussed with patient 10/27/2017  . Neuropathy 08/11/2016  . Vitamin D deficiency 03/15/2016  . 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 Hx of cervical spine surgery (Left ACDF) 02/02/2016  . Chronic pain 01/29/2016  . Marijuana use 01/29/2016  . Osteoarthritis of shoulder (Left) 07/05/2015  . S/P Bilateral Total Shoulder Replacement (2016) 11/23/2014  . Osteoarthritis of shoulder (Right) 04/13/2014  . Routine general medical examination at a health care facility 04/05/2013  . CAROTID BRUIT, LEFT 01/02/2011  . Restless legs syndrome (RLS) 10/21/2010  .  Essential hypertension, benign 06/23/2010  . HYPERLIPIDEMIA-MIXED 10/23/2009  . Valvular heart disease 03/27/2009  . IRRITABLE BOWEL SYNDROME 11/13/2008  . Mood disorder ([place]) 09/07/2007  . Tobacco use disorder 09/07/2007  . GLAUCOMA 09/07/2007  . HEMORRHOIDS 09/07/2007  . GASTRIC ULCER 09/07/2007  . COPD (chronic obstructive pulmonary disease) with chronic bronchitis ([place]) 08/08/2007  . GERD 08/08/2007  . Seizure disorder ([place]) 08/08/2007     Past Surgical History:  Procedure Laterality Date  . ABDOMINAL HYSTERECTOMY     partial  . ANTERIOR CERVICAL DECOMP/DISCECTOMY FUSION   02/06/2011   C4-5, C5-6, C6-7  . BREAST LUMPECTOMY Bilateral 1989   x 3 - benign  . CARDIAC CATHETERIZATION  1995  . CESAREAN SECTION     x 2  . COLONOSCOPY  09/28/2008  . COLONOSCOPY N/A 06/15/2018   Procedure: COLONOSCOPY;  Surgeon: Virgel Manifold, MD;  Location: [facility] ENDOSCOPY;  Service: Endoscopy;  Laterality: N/A;  . ESOPHAGOGASTRODUODENOSCOPY  09/28/2008  . ESOPHAGOGASTRODUODENOSCOPY N/A 06/14/2018   Procedure: ESOPHAGOGASTRODUODENOSCOPY (EGD);  Surgeon: Virgel Manifold, MD;  Location: [facility] ENDOSCOPY;  Service: Endoscopy;  Laterality: N/A;  . HARDWARE REMOVAL  11/17/2006   L5-S1  . LAMINECTOMY WITH POSTERIOR LATERAL ARTHRODESIS LEVEL 3  12/06/2009   with synovial cyst resection L3-4 bilat.  Marland Kitchen LAMINECTOMY WITH POSTERIOR LATERAL ARTHRODESIS LEVEL 3  11/17/2006   L4-5  . NM MYOVIEW LTD     Lexiscan myoview (10/2009): EF 77%, normal wall motion, normal perfusion.   Marland Kitchen SHOULDER ARTHROSCOPY WITH DEBRIDEMENT AND BICEP TENDON REPAIR Right 04/13/2014   Procedure: RIGHT SHOULDER ARTHROSCOPY WITH DEBRIDEMENT EXTENSIVE;  Surgeon: Johnny Bridge, MD;  Location: [place];  Service: Orthopedics;  Laterality: Right;  . TOTAL SHOULDER ARTHROPLASTY Right 11/23/2014   Procedure: TOTAL RIGHT SHOULDER ARTHROPLASTY;  Surgeon: Johnny Bridge, MD;  Location: [place];  Service: Orthopedics;  Laterality: Right;  . TOTAL SHOULDER ARTHROPLASTY Left 07/05/2015   Procedure: LEFT TOTAL SHOULDER REPLACEMENT;  Surgeon: Marchia Bond, MD;  Location: [place];  Service: Orthopedics;  Laterality: Left;     Prior to Admission medications   Medication Sig Start Date End Date Taking? Authorizing Provider  ALPRAZolam Duanne Moron) 1 MG tablet TAKE 1 TABLET THREE TIMES DAILY AS NEEDED FOR ANXIETY 05/02/18   Venia [place], MD  gabapentin (NEURONTIN) 300 MG capsule Take 4 capsules (1,200 mg total) by mouth at bedtime. 06/15/18   Bettey [place], MD  lamoTRIgine (LAMICTAL) 100 MG  tablet TAKE 1 TABLET TWICE DAILY 06/06/18   Venia [place], MD  losartan-hydrochlorothiazide [facility]) 100-12.5 MG tablet TAKE 1 TABLET EVERY DAY 05/02/18   Venia [place], MD  naproxen (NAPROSYN) 500 MG tablet Take 1 tablet (500 mg total) by mouth 2 (two) times daily with a meal. 06/22/18   Carrie Mew, MD  nicotine (NICODERM CQ - DOSED IN MG/24 HOURS) 14 mg/24hr patch Place 1 patch (14 mg total) onto the skin daily. Patient not taking: Reported on 06/20/2018 06/15/18   Bettey Costa, MD  pantoprazole (PROTONIX) 40 MG tablet Take 1 tablet (40 mg total) by mouth 2 (two) times daily. 06/15/18 07/16/18  Bettey Costa, MD  PARoxetine (PAXIL) 20 MG tablet TAKE 1 TABLET EVERY MORNING 09/08/17   Venia [place], MD     Allergies Carbamazepine; Divalproex sodium; Clonazepam; Divalproex sodium; Diphenhydramine hcl; Diphenhydramine hcl; Hydrocodone-acetaminophen; Tiotropium; Tiotropium bromide monohydrate; and Vicodin [hydrocodone-acetaminophen]   Family History  Problem Relation Age of Onset  . Cervical cancer Mother   . Anesthesia  problems Mother        post-op N/V  . Rheum arthritis Mother   . Anxiety disorder Mother   . Healthy Father   . Cancer Father        colon cancer  . Migraines Sister   . Cervical cancer Sister   . Anesthesia problems Sister        post-op N/V  . Migraines Brother   . Asthma Daughter   . Coronary artery disease Maternal Grandmother   . Hypertension Maternal Grandmother   . Diabetes Maternal Grandmother   . Coronary artery disease Paternal Grandmother   . Hypertension Paternal Grandmother   . Diabetes Paternal Grandmother     Social History Social History   Tobacco Use  . Smoking status: Current Every Day Smoker    Packs/day: 1.50    Years: 42.00    Pack years: 63.00    Types: Cigarettes  . Smokeless tobacco: Never Used  Substance Use Topics  . Alcohol use: No    Alcohol/week: 0.0 standard drinks    Comment: occasionally  . Drug use: No     Review of Systems  Constitutional:   No fever or chills.  ENT:   No sore throat. No rhinorrhea. Cardiovascular:   No chest pain or syncope. Respiratory:   No dyspnea or cough. Gastrointestinal:   Negative for abdominal pain, vomiting and diarrhea.  Musculoskeletal:   Left mid back pain as above. All other systems reviewed and are negative except as documented above in ROS and HPI.  ____________________________________________   PHYSICAL EXAM:  VITAL SIGNS: ED Triage Vitals  Enc Vitals Group     BP 06/22/18 1024 (!) 157/96     Pulse Rate 06/22/18 1024 99     Resp 06/22/18 1024 16     Temp 06/22/18 1024 98 F (36.7 C)     Temp Source 06/22/18 1024 Oral     SpO2 06/22/18 1024 98 %     Weight 06/22/18 1025 94 lb (42.6 kg)     Height 06/22/18 1025 5' (1.524 m)     Head Circumference --      Peak Flow --      Pain Score 06/22/18 1025 10     Pain Loc --      Pain Edu? --      Excl. in [place]? --     Vital signs reviewed, nursing assessments reviewed.   Constitutional:   Alert and oriented. Non-toxic appearance. Eyes:   Conjunctivae are normal. EOMI. PERRL. ENT      Head:   Normocephalic and atraumatic.      Nose:   No congestion/rhinnorhea.       Mouth/Throat:   MMM, no pharyngeal erythema. No peritonsillar mass.       Neck:   No meningismus. Full ROM. Hematological/Lymphatic/Immunilogical:   No cervical lymphadenopathy. Cardiovascular:   RRR. Symmetric bilateral radial and DP pulses.  No murmurs. Cap refill less than 2 seconds. Respiratory:   Normal respiratory effort without tachypnea/retractions. Breath sounds are clear and equal bilaterally. No wheezes/rales/rhonchi. Gastrointestinal:   Soft and nontender. Non distended. There is no CVA tenderness.  No rebound, rigidity, or guarding. Genitourinary:   deferred Musculoskeletal:   Normal range of motion in all extremities. No joint effusions.  No lower extremity tenderness.  No edema.  There is tenderness of the left  thoracic back in the area of the rhomboids and paraspinous musculature with muscular tenseness as well, consistent with spasms.  This reproduces her pain.  No inflammatory changes.  No midline spinal tenderness. Neurologic:   Normal speech and language.  Motor grossly intact. No acute focal neurologic deficits are appreciated.  Skin:    Skin is warm, dry and intact. No rash noted.  No petechiae, purpura, or bullae.  ____________________________________________    LABS (pertinent positives/negatives) (all labs ordered are listed, but only abnormal results are displayed) Labs Reviewed  CBC - Abnormal; Notable for the following components:      Result Value   WBC 11.2 (*)    Hemoglobin 10.4 (*)    HCT 32.8 (*)    MCV 70.4 (*)    MCH 22.3 (*)    MCHC 31.7 (*)    RDW 21.8 (*)    All other components within normal limits  URINALYSIS, COMPLETE (UACMP) WITH MICROSCOPIC - Abnormal; Notable for the following components:   Color, Urine STRAW (*)    APPearance CLEAR (*)    Specific Gravity, Urine 1.004 (*)    Hgb urine dipstick SMALL (*)    Bacteria, UA RARE (*)    All other components within normal limits  URINE CULTURE  BASIC METABOLIC PANEL   ____________________________________________   EKG  Interpreted by me Sinus rhythm rate of 89, normal axis and intervals.  Normal QRS ST segments and T waves.  ____________________________________________    RADIOLOGY  Dg Chest 2 View  Result Date: 06/22/2018 CLINICAL DATA:  Acute shortness of breath and LEFT rib pain for 1 day. No known injury. Initial encounter. EXAM: CHEST - 2 VIEW COMPARISON:  06/13/2018 CT, 04/25/2015 radiographs and other studies FINDINGS: The cardiomediastinal silhouette is unremarkable. There is no evidence of focal airspace disease, pulmonary edema, suspicious pulmonary nodule/mass, pleural effusion, or pneumothorax. No acute bony abnormalities are identified. Thoracolumbar scoliosis, bilateral shoulder  arthroplasties, LOWER cervical spine fusion hardware and remote UPPER LEFT rib fractures again noted. IMPRESSION: No evidence of active cardiopulmonary disease or acute abnormality. Electronically Signed   By: Margarette Canada M.D.   On: 06/22/2018 11:19   Ct Angio Chest Pe W And/or Wo Contrast  Result Date: 06/22/2018 CLINICAL DATA:  Chest pain EXAM: CT ANGIOGRAPHY CHEST WITH CONTRAST TECHNIQUE: Multidetector CT imaging of the chest was performed using the standard protocol during bolus administration of intravenous contrast. Multiplanar CT image reconstructions and MIPs were obtained to evaluate the vascular anatomy. CONTRAST:  60 mL ISOVUE-370 IOPAMIDOL (ISOVUE-370) INJECTION 76% COMPARISON:  Chest CT June 13, 2018; chest radiograph June 22, 2018 FINDINGS: Cardiovascular: There is no demonstrable pulmonary embolus. There is no thoracic aortic aneurysm or dissection. Visualized great vessels appear unremarkable. There is aortic atherosclerosis. There are foci of coronary artery calcification. There is no pericardial effusion or pericardial thickening. Mediastinum/Nodes: Thyroid appears unremarkable. There is no appreciable thoracic adenopathy. No esophageal lesions are evident. Lungs/Pleura: There is centrilobular and paraseptal emphysematous change. There is no appreciable edema or consolidation. On axial slice 37 series 6, there is a nodular opacity in the posterior segment of the left upper lobe measuring 3 mm. There is a 1 mm nodular opacity near the pleura in the anterior segment of the left upper lobe, also on slice 37 series 6. No evident pleural effusion or pleural thickening. Upper Abdomen: Visualized upper abdominal structures appear unremarkable. Musculoskeletal: There are no blastic or lytic bone lesions. There are foci of degenerative change in the thoracic spine as well as thoracic dextroscoliosis. There are total shoulder replacements bilaterally. Review of the MIP images confirms the above  findings. IMPRESSION: 1. No demonstrable pulmonary  embolus. No thoracic aortic aneurysm or dissection. There is aortic atherosclerosis. There are foci of coronary artery calcification. 2.  Underlying emphysematous change.  No edema or consolidation. 3. Small left upper lobe pulmonary nodular opacities, largest measuring 3 mm. No follow-up needed if patient is low-risk (and has no known or suspected primary neoplasm). Non-contrast chest CT can be considered in 12 months if patient is high-risk. This recommendation follows the consensus statement: Guidelines for Management of Incidental Pulmonary Nodules Detected on CT Images: From the Fleischner Society 2017; Radiology 2017; 284:228-243. 4.  No evident thoracic adenopathy. Aortic Atherosclerosis (ICD10-I70.0) and Emphysema (ICD10-J43.9). Electronically Signed   By: Lowella Grip III M.D.   On: 06/22/2018 13:02    ____________________________________________   PROCEDURES Procedures  ____________________________________________  DIFFERENTIAL DIAGNOSIS   Pneumonia, pneumothorax, pulmonary embolism, muscle spasm  CLINICAL IMPRESSION / ASSESSMENT AND PLAN / ED COURSE  Pertinent labs & imaging results that were available during my care of the patient were reviewed by me and considered in my medical decision making (see chart for details).    Patient presents with severe back pain.  Exam is consistent with a muscle spasm and musculoskeletal pain, but due to her recent hospitalization, cancer history, and pleuritic nature of the pain, repeat CT angiogram of the chest was obtained today which was normal.  Patient is feeling better after Lidoderm patch was applied.  She is sleeping comfortably on my reassessment although she does continue to ask for more pain medicine.  Provide her 1 dose of oral tramadol and then discharged without any opioid prescriptions.  Recommended NSAIDs, heat therapy, continued follow-up with primary care.       ____________________________________________   FINAL CLINICAL IMPRESSION(S) / ED DIAGNOSES    Final diagnoses:  Spasm of thoracic back muscle     ED Discharge Orders         Ordered    naproxen (NAPROSYN) 500 MG tablet  2 times daily with meals     06/22/18 1620          Portions of this note were generated with dragon dictation software. Dictation errors may occur despite best attempts at proofreading.    Carrie Mew, MD 06/22/18 [phone]

## 2018-06-22 NOTE — ED Triage Notes (Signed)
Pt arrived via POV from home with reports of left back pain. Pt has pain around the flank area, Pt states the pain hurts when she takes in a deep breath as well.  Denies any dysuria or abdominal pain.  No prior hx of kidney stones.  Area is tender to touch.  Pt states the pain feels like the muscle hurts, but also states it hurts worse when taking in a breath.  Pt recently admitted for low hemoglobin.  Pt has not taken any medications for pain.

## 2018-06-22 NOTE — ED Notes (Signed)
Patient transported to CT 

## 2018-06-22 NOTE — Discharge Instructions (Signed)
Your labs and CT scan of the chest were okay today. Your examination shows that your pain is coming from muscle spasms in your back.

## 2018-06-23 LAB — URINE CULTURE

## 2018-06-28 ENCOUNTER — Ambulatory Visit: Payer: Medicare HMO | Admitting: Gastroenterology

## 2018-06-28 ENCOUNTER — Encounter: Payer: Self-pay | Admitting: Gastroenterology

## 2018-06-28 VITALS — BP 132/80 | HR 71 | Ht 59.0 in | Wt 92.4 lb

## 2018-06-28 DIAGNOSIS — D508 Other iron deficiency anemias: Secondary | ICD-10-CM

## 2018-06-28 DIAGNOSIS — K259 Gastric ulcer, unspecified as acute or chronic, without hemorrhage or perforation: Secondary | ICD-10-CM | POA: Diagnosis not present

## 2018-06-28 MED ORDER — PANTOPRAZOLE SODIUM 40 MG PO TBEC: 40.0000 mg | DELAYED_RELEASE_TABLET | Freq: Two times a day (BID) | ORAL | 0 refills | Status: DC

## 2018-06-28 NOTE — Progress Notes (Signed)
Molly Antigua, MD [address]  [address]  [place], [place] [address]  Main: [phone]  Fax: [phone]   Primary Care Physician: Venia Carbon, MD  Primary Gastroenterologist:  Dr. Vonda Peters  Chief Complaint  Patient presents with  . Follow-up    ED f/u upper GI Bleed, constipation    HPI: Molly Peters is a 57 y.o. female here for posthospitalization follow-up for upper GI bleed, melena, and anemia.  Patient was found to have 2 clean-based ulcers in the stomach on EGD with no H. pylori seen on gastric biopsies.  Colonoscopy showed 5 mm sigmoid colon polyp that was removed.  Fair prep noted, repeat recommended in 6 to 12 months.  Patient recently went to the ER with back pain and was prescribed naproxen.  Due to patient's gastric ulcers, she was told not to take NSAIDs during her hospitalization, and states she has not been taking the naproxen.  Is compliant with her PPI twice daily.  No further melena, bright red blood per rectum, abdominal pain, nausea or vomiting, dysphagia, heartburn, or weight loss.  Hemoglobin has improved to 10.4 on last check, and was 6.9 on hospital admission 2 weeks ago.  Ferritin was low at 9.  Patient is not on any iron replacement.  Current Outpatient Medications  Medication Sig Dispense Refill  . ALPRAZolam (XANAX) 1 MG tablet TAKE 1 TABLET THREE TIMES DAILY AS NEEDED FOR ANXIETY 270 tablet 0  . gabapentin (NEURONTIN) 300 MG capsule Take 4 capsules (1,200 mg total) by mouth at bedtime.    . lamoTRIgine (LAMICTAL) 100 MG tablet TAKE 1 TABLET TWICE DAILY 180 tablet 1  . losartan-hydrochlorothiazide (HYZAAR) 100-12.5 MG tablet TAKE 1 TABLET EVERY DAY 90 tablet 1  . pantoprazole (PROTONIX) 40 MG tablet Take 1 tablet (40 mg total) by mouth 2 (two) times daily. 180 tablet 0  . PARoxetine (PAXIL) 20 MG tablet TAKE 1 TABLET EVERY MORNING 90 tablet 3  . naproxen (NAPROSYN) 500 MG tablet Take 1 tablet (500 mg total) by mouth 2  (two) times daily with a meal. (Patient not taking: Reported on 06/28/2018) 20 tablet 0  . nicotine (NICODERM CQ - DOSED IN MG/24 HOURS) 14 mg/24hr patch Place 1 patch (14 mg total) onto the skin daily. (Patient not taking: Reported on 06/20/2018) 28 patch 0   No current facility-administered medications for this visit.     Allergies as of 06/28/2018 - Review Complete 06/28/2018  Allergen Reaction Noted  . Carbamazepine Hives 08/26/2015  . Divalproex sodium Swelling 01/01/2016  . Clonazepam Other (See Comments) 08/12/2005  . Divalproex sodium Swelling 08/12/2005  . Diphenhydramine hcl Other (See Comments) 08/12/2005  . Diphenhydramine hcl Rash and Other (See Comments) 08/26/2015  . Hydrocodone-acetaminophen Itching 08/26/2015  . Tiotropium Other (See Comments) 01/01/2016  . Tiotropium bromide monohydrate Other (See Comments)   . Vicodin [hydrocodone-acetaminophen] Itching 11/15/2014    ROS:  General: Negative for anorexia, weight loss, fever, chills, fatigue, weakness. ENT: Negative for hoarseness, difficulty swallowing , nasal congestion. CV: Negative for chest pain, angina, palpitations, dyspnea on exertion, peripheral edema.  Respiratory: Negative for dyspnea at rest, dyspnea on exertion, cough, sputum, wheezing.  GI: See history of present illness. GU:  Negative for dysuria, hematuria, urinary incontinence, urinary frequency, nocturnal urination.  Endo: Negative for unusual weight change.    Physical Examination:   BP 132/80   Pulse 71   Ht 4\' 11"  (1.499 m)   Wt 92 lb 6.4 oz (41.9 kg)  BMI 18.66 kg/m    General: Well-nourished, well-developed in no acute distress.  Eyes: No icterus. Conjunctivae pink. Mouth: Oropharyngeal mucosa moist and pink , no lesions erythema or exudate. Neck: Supple, Trachea midline Abdomen: Bowel sounds are normal, nontender, nondistended, no hepatosplenomegaly or masses, no abdominal bruits or hernia , no rebound or guarding.   Extremities: No  lower extremity edema. No clubbing or deformities. Neuro: Alert and oriented x 3.  Grossly intact. Skin: Warm and dry, no jaundice.   Psych: Alert and cooperative, normal mood and affect.   Labs: CMP     Component Value Date/Time   NA 137 06/22/2018 1042   K 3.7 06/22/2018 1042   CL 100 06/22/2018 1042   CO2 26 06/22/2018 1042   GLUCOSE 74 06/22/2018 1042   BUN 8 06/22/2018 1042   CREATININE 0.84 06/22/2018 1042   CALCIUM 9.4 06/22/2018 1042   PROT 7.6 10/27/2017 1048   ALBUMIN 4.2 10/27/2017 1048   AST 16 10/27/2017 1048   ALT 8 10/27/2017 1048   ALKPHOS 93 10/27/2017 1048   BILITOT 0.4 10/27/2017 1048   GFRNONAA >60 06/22/2018 1042   GFRAA >60 06/22/2018 1042   Lab Results  Component Value Date   WBC 11.2 (H) 06/22/2018   HGB 10.4 (L) 06/22/2018   HCT 32.8 (L) 06/22/2018   MCV 70.4 (L) 06/22/2018   PLT 425 06/22/2018    Imaging Studies: Dg Chest 2 View  Result Date: 06/22/2018 CLINICAL DATA:  Acute shortness of breath and LEFT rib pain for 1 day. No known injury. Initial encounter. EXAM: CHEST - 2 VIEW COMPARISON:  06/13/2018 CT, 04/25/2015 radiographs and other studies FINDINGS: The cardiomediastinal silhouette is unremarkable. There is no evidence of focal airspace disease, pulmonary edema, suspicious pulmonary nodule/mass, pleural effusion, or pneumothorax. No acute bony abnormalities are identified. Thoracolumbar scoliosis, bilateral shoulder arthroplasties, LOWER cervical spine fusion hardware and remote UPPER LEFT rib fractures again noted. IMPRESSION: No evidence of active cardiopulmonary disease or acute abnormality. Electronically Signed   By: Margarette Canada M.D.   On: 06/22/2018 11:19   Dg Cervical Spine Complete  Result Date: 06/13/2018 CLINICAL DATA:  Neck pain and right arm numbness. [place] out of bed last week. Prior cervical fusion. EXAM: CERVICAL SPINE - COMPLETE 4+ VIEW COMPARISON:  Cervical spine radiographs 04/25/2015. Soft tissue neck CT 12/06/2017.  FINDINGS: Grade 1 anterolisthesis of C3 on C4 and C7 on T1 is unchanged from the prior radiographs. Sequelae of C4-C7 ACDF are again identified with unchanged appearance of the anterior fusion plate and screws. Bulky anterior vertebral osteophyte formation at C3-4 is unchanged. Mild C3-4 disc space narrowing is new from 2016. C7-T1 disc space narrowing has not significantly changed. Moderate to severe facet arthrosis is noted at C2-3, C3-4, and C7-T1. Multilevel osseous neural foraminal stenosis is again seen due to uncovertebral spurring and facet arthrosis, less well evaluated than on the prior radiographs due to suboptimal positioning though with likely severe right-sided neural foraminal stenosis at C6-7. No acute fracture is identified. The prevertebral soft tissues are within normal limits. The visualized lung apices are clear. IMPRESSION: 1. No acute osseous abnormality identified. 2. Stable appearance of C4-C7 ACDF. 3. Progressive C3-4 disc space narrowing from 2016. 4. Moderate to severe multilevel facet arthrosis. Electronically Signed   By: Logan Bores M.D.   On: 06/13/2018 14:35   Ct Head Wo Contrast  Result Date: 06/13/2018 CLINICAL DATA:  [place] off of couch 2 weeks ago. Pain. Headache. More recent fall, hitting top of  head. EXAM: CT HEAD WITHOUT CONTRAST TECHNIQUE: Contiguous axial images were obtained from the base of the skull through the vertex without intravenous contrast. COMPARISON:  CT head 01/11/2018.  MR head 03/20/2009. FINDINGS: Brain: No evidence for acute infarction, hemorrhage, mass lesion, hydrocephalus, or extra-axial fluid. Premature for age atrophy. Extensive hypoattenuation throughout the white matter, consistent with small vessel disease, also premature for age. Remote LEFT basal ganglia lacunar infarct, was chronic in 2010. Vascular: Calcification of the cavernous internal carotid arteries consistent with cerebrovascular atherosclerotic disease. No signs of intracranial large  vessel occlusion. Skull: Calvarium intact. Sinuses/Orbits: Clear sinuses.  No orbital findings of significance. Other: Advanced LEFT TMJ arthritis. Compared to priors, similar appearance. Previously noted scalp hematoma is improved. IMPRESSION: Atrophy and small vessel disease. Evidence for remote LEFT basal ganglia infarct. No acute intracranial findings. Similar appearance to 01/11/2018. No posttraumatic sequelae are evident. Electronically Signed   By: Staci Righter M.D.   On: 06/13/2018 14:07   Ct Angio Chest Pe W And/or Wo Contrast  Result Date: 06/22/2018 CLINICAL DATA:  Chest pain EXAM: CT ANGIOGRAPHY CHEST WITH CONTRAST TECHNIQUE: Multidetector CT imaging of the chest was performed using the standard protocol during bolus administration of intravenous contrast. Multiplanar CT image reconstructions and MIPs were obtained to evaluate the vascular anatomy. CONTRAST:  60 mL ISOVUE-370 IOPAMIDOL (ISOVUE-370) INJECTION 76% COMPARISON:  Chest CT June 13, 2018; chest radiograph June 22, 2018 FINDINGS: Cardiovascular: There is no demonstrable pulmonary embolus. There is no thoracic aortic aneurysm or dissection. Visualized great vessels appear unremarkable. There is aortic atherosclerosis. There are foci of coronary artery calcification. There is no pericardial effusion or pericardial thickening. Mediastinum/Nodes: Thyroid appears unremarkable. There is no appreciable thoracic adenopathy. No esophageal lesions are evident. Lungs/Pleura: There is centrilobular and paraseptal emphysematous change. There is no appreciable edema or consolidation. On axial slice 37 series 6, there is a nodular opacity in the posterior segment of the left upper lobe measuring 3 mm. There is a 1 mm nodular opacity near the pleura in the anterior segment of the left upper lobe, also on slice 37 series 6. No evident pleural effusion or pleural thickening. Upper Abdomen: Visualized upper abdominal structures appear unremarkable.  Musculoskeletal: There are no blastic or lytic bone lesions. There are foci of degenerative change in the thoracic spine as well as thoracic dextroscoliosis. There are total shoulder replacements bilaterally. Review of the MIP images confirms the above findings. IMPRESSION: 1. No demonstrable pulmonary embolus. No thoracic aortic aneurysm or dissection. There is aortic atherosclerosis. There are foci of coronary artery calcification. 2.  Underlying emphysematous change.  No edema or consolidation. 3. Small left upper lobe pulmonary nodular opacities, largest measuring 3 mm. No follow-up needed if patient is low-risk (and has no known or suspected primary neoplasm). Non-contrast chest CT can be considered in 12 months if patient is high-risk. This recommendation follows the consensus statement: Guidelines for Management of Incidental Pulmonary Nodules Detected on CT Images: From the Fleischner Society 2017; Radiology 2017; 284:228-243. 4.  No evident thoracic adenopathy. Aortic Atherosclerosis (ICD10-I70.0) and Emphysema (ICD10-J43.9). Electronically Signed   By: Lowella Grip III M.D.   On: 06/22/2018 13:02   Ct Angio Chest/abd/pel For Dissection W And/or Wo Contrast  Result Date: 06/13/2018 CLINICAL DATA:  Chest pain EXAM: CT ANGIOGRAPHY CHEST, ABDOMEN AND PELVIS TECHNIQUE: Multidetector CT imaging through the chest, abdomen and pelvis was performed using the standard protocol during bolus administration of intravenous contrast. Multiplanar reconstructed images and MIPs were obtained and reviewed to  evaluate the vascular anatomy. CONTRAST:  6mL ISOVUE-370 IOPAMIDOL (ISOVUE-370) INJECTION 76% COMPARISON:  02/02/2018.  06/26/2011. FINDINGS: CTA CHEST FINDINGS Cardiovascular: Atherosclerotic calcifications of the thoracic aorta are noted. This includes the aortic arch. Moderate 3 vessel coronary artery calcification. There is no evidence of acute intramural hematoma on noncontrast images. There is no evidence  of aortic dissection or aneurysm. The thoracic aorta is patent with scattered atherosclerotic calcifications and smooth plaque in the descending thoracic aorta. Great vessels are patent within the confines of the examination. Vertebral arteries are also patent. Mediastinum/Nodes: No abnormal mediastinal adenopathy. Thyroid is atrophic. No pericardial effusion. Lungs/Pleura: Advanced emphysema towards the lung apices which is panlobular. Stable 3 mm left lower lobe pulmonary nodule on image 98 of series 7. Stable 3 mm left upper lobe pulmonary nodule on image 60. Musculoskeletal: No vertebral compression deformity. Right shoulder arthroplasty is in place. No evidence of acute rib fracture. No destructive bone lesion. Review of the MIP images confirms the above findings. CTA ABDOMEN AND PELVIS FINDINGS VASCULAR Aorta: Nonaneurysmal and patent with scattered atherosclerotic calcifications and soft plaque. No evidence of aortic dissection. Celiac: Patent. SMA: Patent. Renals: 2 right renal arteries are patent. There are atherosclerotic changes at the origin of the left renal artery. Exact degree of narrowing is difficult. IMA: Diminutive and patent. Inflow: There is smooth soft plaque as well as atherosclerotic calcified plaque in both common iliac arteries without aneurysmal dilatation, significant narrowing, or dissection. Scattered atherosclerotic changes of the bilateral internal and external iliac arteries are also present without evidence of significant narrowing or dissection. Review of the MIP images confirms the above findings. NON-VASCULAR Hepatobiliary: Gallbladder and liver are within normal limits for arterial phase imaging. Pancreas: Unremarkable Spleen: Unremarkable Adrenals/Urinary Tract: Prominence of the adrenal glands in a hyperplasia pattern is stable. Kidneys are within normal limits bilaterally. Stomach/Bowel: Stomach and duodenum are within normal limits. There is no dilatation of bowel to suggest  obstruction. No obvious mass in the visualized colon. Lymphatic: No abnormal retroperitoneal adenopathy Reproductive: Uterus is absent.  Adnexa are unremarkable. Other: No free fluid. Musculoskeletal: No vertebral compression deformity. L3 through S1 fusion hardware is in place. Review of the MIP images confirms the above findings. IMPRESSION: Vascular: No evidence of aortic dissection or intramural hematoma. The abdominal aorta is nonaneurysmal and patent. Nonvascular: Stable 3 mm pulmonary nodules in the left lung supporting benign etiology. No acute intra thoracic or intra-abdominal pathology. Aortic Atherosclerosis (ICD10-I70.0) and Emphysema (ICD10-J43.9). Electronically Signed   By: Marybelle Killings M.D.   On: 06/13/2018 15:32    Assessment and Plan:   Molly Peters is a 58 y.o. y/o female here for follow-up post hospitalization for melena, gastric ulcers  Melena resolved Hemoglobin improved Continue PPI twice daily for a total of 3 months since her EGD We will follow-up in clinic at that time, and discontinue medication as long as hemoglobin is continuing to improve Will refer to hematology due to iron deficiency Patient asked to continue to avoid NSAIDs and she verbalized understanding Need for colonoscopy in 6 to 12 months discussed again due to fair prep seen on last procedure.  Office staff will place recall letter for colonoscopy to schedule in 6 months  If melena recurs if abdominal pain, or melena reoccurs, patient was asked to contact us and she verbalized understanding   Dr Molly Peters

## 2018-07-05 ENCOUNTER — Inpatient Hospital Stay: Payer: Medicare HMO

## 2018-07-05 ENCOUNTER — Other Ambulatory Visit: Payer: Self-pay

## 2018-07-05 ENCOUNTER — Other Ambulatory Visit: Payer: Self-pay | Admitting: Urgent Care

## 2018-07-05 ENCOUNTER — Encounter: Payer: Self-pay | Admitting: Hematology and Oncology

## 2018-07-05 ENCOUNTER — Inpatient Hospital Stay: Payer: Medicare HMO | Attending: Hematology and Oncology | Admitting: Hematology and Oncology

## 2018-07-05 VITALS — BP 150/88 | HR 81 | Temp 96.2°F | Resp 18 | Ht 61.81 in | Wt 90.6 lb

## 2018-07-05 DIAGNOSIS — F419 Anxiety disorder, unspecified: Secondary | ICD-10-CM

## 2018-07-05 DIAGNOSIS — Z1239 Encounter for other screening for malignant neoplasm of breast: Secondary | ICD-10-CM

## 2018-07-05 DIAGNOSIS — R634 Abnormal weight loss: Secondary | ICD-10-CM | POA: Diagnosis not present

## 2018-07-05 DIAGNOSIS — F17210 Nicotine dependence, cigarettes, uncomplicated: Secondary | ICD-10-CM | POA: Diagnosis not present

## 2018-07-05 DIAGNOSIS — D500 Iron deficiency anemia secondary to blood loss (chronic): Secondary | ICD-10-CM

## 2018-07-05 DIAGNOSIS — Z79899 Other long term (current) drug therapy: Secondary | ICD-10-CM | POA: Diagnosis not present

## 2018-07-05 DIAGNOSIS — R5383 Other fatigue: Secondary | ICD-10-CM

## 2018-07-05 DIAGNOSIS — D509 Iron deficiency anemia, unspecified: Secondary | ICD-10-CM

## 2018-07-05 DIAGNOSIS — Z1231 Encounter for screening mammogram for malignant neoplasm of breast: Secondary | ICD-10-CM

## 2018-07-05 LAB — CBC WITH DIFFERENTIAL/PLATELET
BASOS ABS: 0.1 10*3/uL (ref 0–0.1)
Basophils Relative: 2 %
Eosinophils Absolute: 0.1 10*3/uL (ref 0–0.7)
Eosinophils Relative: 1 %
HEMATOCRIT: 31 % — AB (ref 35.0–47.0)
HEMOGLOBIN: 10.2 g/dL — AB (ref 12.0–16.0)
LYMPHS PCT: 45 %
Lymphs Abs: 2.6 10*3/uL (ref 1.0–3.6)
MCH: 23.2 pg — ABNORMAL LOW (ref 26.0–34.0)
MCHC: 32.8 g/dL (ref 32.0–36.0)
MCV: 70.8 fL — ABNORMAL LOW (ref 80.0–100.0)
Monocytes Absolute: 0.7 10*3/uL (ref 0.2–0.9)
Monocytes Relative: 12 %
NEUTROS ABS: 2.3 10*3/uL (ref 1.4–6.5)
NEUTROS PCT: 40 %
Platelets: 477 10*3/uL — ABNORMAL HIGH (ref 150–440)
RBC: 4.38 MIL/uL (ref 3.80–5.20)
RDW: 22.5 % — ABNORMAL HIGH (ref 11.5–14.5)
WBC: 5.7 10*3/uL (ref 3.6–11.0)

## 2018-07-05 LAB — TSH: TSH: 1.256 u[IU]/mL (ref 0.350–4.500)

## 2018-07-05 LAB — FOLATE: Folate: 6.3 ng/mL (ref >5.9)

## 2018-07-05 LAB — VITAMIN B12: VITAMIN B 12: 165 pg/mL — AB (ref 180–914)

## 2018-07-05 LAB — FERRITIN: FERRITIN: 7 ng/mL — AB (ref 11–307)

## 2018-07-05 NOTE — Progress Notes (Signed)
Here for new pt evaluation.  

## 2018-07-05 NOTE — Progress Notes (Signed)
[facility]-  Cancer Center  Clinic day:  07/05/2018  Chief Complaint: Molly Peters is a 57 y.o. female with iron deficiency anemia who is referred in consultation by Dr. Varnita Tahiliani for assessment and management.  HPI: The patient was admitted to [facility] from 06/13/2018 - 06/15/2018 with an upper GI bleed.  She presented with chest pain, epigastric discomfort and nausea.   She noted a fall 1 week prior to admission.  She described intermittent melena.   Hemoglobin was 7.7 (12.1 on 10/27/2017) with a nadir of 6.9 on 06/14/2018.  EGD on 06/14/2018 by Dr. Tahiliani revealed esophageal mucosal changes c/w short segment of Barrett's and a non-bleeding gastric ulcers.  Pathology at the GE junction revealed squamocolumnar mucosa with moderate chronic inflammation.  Gastric biopsy revealed no H. pylori, dysplasia, or malignancy.  Colonoscopy on 06/15/2018 revealed a 5 mm polyp in the sigmoid colon (tubular adenoma).  Repeat colonoscopy was discussed in 6-12 months due to fair prep.    She received 1 unit of PRBCs.  She continued on a PPI twice a day.  NSAIDS were to be avoided.  Ferritin was 9, iron saturation 11% and TIBC 393 on 06/20/2018.  Hemoglobin was 9.8 on 06/15/2018.  Follow-up CBC on 06/22/2018 revealed a hematocrit of 32, hemoglobin 10.4, and MCV 70.4.  Symptomatically, patient complains of marked fatigue and exertional shortness of breath. Patient denies bleeding; no hematochezia, melena, or gross hematuria. Patient advises that she maintains a poor appetite overall citing the fact that she is "just not hungry". She is eating better. Patient maintains a diet rich in iron.  She states that oral iron causes constipation.  She indicates that she eats meat and green leafy vegetables on a consistent basis. Weight today is 90 lb 9.6 oz (41.1 kg).  She is losing weight. Her baseline weight is around 106 pounds.   Patient denies fevers. Patient has fluctuating periods of  being cold and hot. She has sweats at night "sometimes". Patient has ice pica. Husband notes that he had to purchase a second ice maker because he was buying 2 bags of ice every other day. Patient has significant restless leg symptoms. Patient notes polyarthralgias that correlate with changes in the weather.  Patient's last mammogram was 4-5 years ago.   Patient denies pain in the clinic today.   Past Medical History:  Diagnosis Date  . Anxiety   . Cancer (HCC)   . Chronic back pain 11/12/2009   Qualifier: Diagnosis of  By: Bean FNP, Billie-Lynn Daniels   . COPD (chronic obstructive pulmonary disease) (HCC)    states SOB with ADLs; no O2 use; able to speak in complete sentences without SOB(11/15/2014)  . Depression   . Difficulty swallowing pills    s/p cervical fusion  . Family history of adverse reaction to anesthesia    pt's mother and sister have hx. of post-op N/V  . Fibromyalgia   . GERD (gastroesophageal reflux disease)   . History of gastric ulcer   . Hypertension    states under control with med., has been on med. x 1-2 yr.  . IBS (irritable bowel syndrome)   . Internal hemorrhoid    states has had intermittent bright red bleeding with BM (11/15/2014)  . Limited joint range of motion    neck - s/p cervical fusion  . Localized primary osteoarthritis of right shoulder region 11/23/2014  . Osteoarthritis of left shoulder 07/05/2015  . Osteoarthritis of right shoulder 11/2014  . Seizures (HCC)      last seizure 2010  . Short-term memory loss   . TMJ syndrome   . Valvular heart disease    Initial workup a number of years ago at [facility].  Echo (10/2009) showed EF 60-65%,      normal LV size, moderate aortic insufficiency with a trileaflet aortic valve and normal aortic root size.  Mild      mitral regurgitation.   . Wears dentures    lower    Past Surgical History:  Procedure Laterality Date  . ABDOMINAL HYSTERECTOMY     partial  . ANTERIOR CERVICAL DECOMP/DISCECTOMY FUSION   02/06/2011   C4-5, C5-6, C6-7  . BREAST LUMPECTOMY Bilateral 1989   x 3 - benign  . CARDIAC CATHETERIZATION  1995  . CESAREAN SECTION     x 2  . COLONOSCOPY  09/28/2008  . COLONOSCOPY N/A 06/15/2018   Procedure: COLONOSCOPY;  Surgeon: Virgel Manifold, MD;  Location: [facility] ENDOSCOPY;  Service: Endoscopy;  Laterality: N/A;  . ESOPHAGOGASTRODUODENOSCOPY  09/28/2008  . ESOPHAGOGASTRODUODENOSCOPY N/A 06/14/2018   Procedure: ESOPHAGOGASTRODUODENOSCOPY (EGD);  Surgeon: Virgel Manifold, MD;  Location: [facility] ENDOSCOPY;  Service: Endoscopy;  Laterality: N/A;  . HARDWARE REMOVAL  11/17/2006   L5-S1  . LAMINECTOMY WITH POSTERIOR LATERAL ARTHRODESIS LEVEL 3  12/06/2009   with synovial cyst resection L3-4 bilat.  Marland Kitchen LAMINECTOMY WITH POSTERIOR LATERAL ARTHRODESIS LEVEL 3  11/17/2006   L4-5  . NM MYOVIEW LTD     Lexiscan myoview (10/2009): EF 77%, normal wall motion, normal perfusion.   Marland Kitchen SHOULDER ARTHROSCOPY WITH DEBRIDEMENT AND BICEP TENDON REPAIR Right 04/13/2014   Procedure: RIGHT SHOULDER ARTHROSCOPY WITH DEBRIDEMENT EXTENSIVE;  Surgeon: Johnny Bridge, MD;  Location: [place];  Service: Orthopedics;  Laterality: Right;  . TOTAL SHOULDER ARTHROPLASTY Right 11/23/2014   Procedure: TOTAL RIGHT SHOULDER ARTHROPLASTY;  Surgeon: Johnny Bridge, MD;  Location: [place];  Service: Orthopedics;  Laterality: Right;  . TOTAL SHOULDER ARTHROPLASTY Left 07/05/2015   Procedure: LEFT TOTAL SHOULDER REPLACEMENT;  Surgeon: Marchia Bond, MD;  Location: [place];  Service: Orthopedics;  Laterality: Left;    Family History  Problem Relation Age of Onset  . Cervical cancer Mother   . Anesthesia problems Mother        post-op N/V  . Rheum arthritis Mother   . Anxiety disorder Mother   . Cancer Father        colon cancer  . Migraines Sister   . Cervical cancer Sister   . Anesthesia problems Sister        post-op N/V  . Migraines Brother   . Asthma Daughter    . Cerebral palsy Daughter        age 65  . Coronary artery disease Maternal Grandmother   . Hypertension Maternal Grandmother   . Diabetes Maternal Grandmother   . Coronary artery disease Paternal Grandmother   . Hypertension Paternal Grandmother   . Diabetes Paternal Grandmother     Social History:  reports that she has been smoking cigarettes. She has a 63.00 pack-year smoking history. She has never used smokeless tobacco. She reports that she has current or past drug history. Drug: Marijuana. She reports that she does not drink alcohol.  Patient smoked 1.5 packs of cigarettes per day for 40+ years. She drinks "a hair" every now and then. Patient has been on disability in 2005 for "ruptured back". She was a former [employer]. The patient is accompanied by her husband, Fritz Pickerel, today.  Allergies:  Allergies  Allergen Reactions  . Carbamazepine Hives  . Divalproex Sodium Swelling    HAIR FALLS OUT  . Clonazepam Other (See Comments)    HALLUCINATIONS HALLUCINATIONS HALLUCINATIONS  . Divalproex Sodium Swelling    HAIR FALLS OUT  . Diphenhydramine Hcl Other (See Comments)    UNKNOWN  . Diphenhydramine Hcl Rash and Other (See Comments)    UNKNOWN UNKNOWN  . Hydrocodone-Acetaminophen Itching  . Tiotropium Other (See Comments)    CREATES YEAST IN THROAT  . Tiotropium Bromide Monohydrate Other (See Comments)    CREATES YEAST IN THROAT  . Vicodin [Hydrocodone-Acetaminophen] Itching    Current Medications: Current Outpatient Medications  Medication Sig Dispense Refill  . ALPRAZolam (XANAX) 1 MG tablet TAKE 1 TABLET THREE TIMES DAILY AS NEEDED FOR ANXIETY 270 tablet 0  . gabapentin (NEURONTIN) 300 MG capsule Take 4 capsules (1,200 mg total) by mouth at bedtime.    . lamoTRIgine (LAMICTAL) 100 MG tablet TAKE 1 TABLET TWICE DAILY 180 tablet 1  . losartan-hydrochlorothiazide (HYZAAR) 100-12.5 MG tablet TAKE 1 TABLET EVERY DAY (Patient taking differently: Take by mouth daily. ) 90 tablet 1  .  naproxen (NAPROSYN) 500 MG tablet Take 1 tablet (500 mg total) by mouth 2 (two) times daily with a meal. 20 tablet 0  . pantoprazole (PROTONIX) 40 MG tablet Take 1 tablet (40 mg total) by mouth 2 (two) times daily. 180 tablet 0  . PARoxetine (PAXIL) 20 MG tablet TAKE 1 TABLET EVERY MORNING 90 tablet 3  . nicotine (NICODERM CQ - DOSED IN MG/24 HOURS) 14 mg/24hr patch Place 1 patch (14 mg total) onto the skin daily. (Patient not taking: Reported on 06/20/2018) 28 patch 0   No current facility-administered medications for this visit.     Review of Systems:  GENERAL:  Fatigue.  Tired all of the time.  No fevers or sweats.  Baseline weight 104-106 pounds.  Weight loss of 15 pounds. PERFORMANCE STATUS (ECOG):  1 HEENT:  Runny nose.  No visual changes, sore throat, mouth sores or tenderness. Lungs: Shortness of breath with exertion.  Cough.  No hemoptysis. Cardiac:  No chest pain, palpitations, orthopnea, or PND. GI:  Diarrhea alternating with constipation.  No nausea, vomiting, melena or hematochezia.  Ice pica. GU:  Voids frequently secondary to eating ice.  No urgency, frequency, dysuria, or hematuria. Musculoskeletal:  All bones and joints ache, especially with change in weather.  s/p shoulder replacement.  Prior back surgery.  No muscle tenderness. Extremities:  No pain or swelling. Skin:  No rashes or skin changes. Neuro:  Headache, at times.  Restless legs.  No numbness or weakness, balance or coordination issues. Endocrine:  No diabetes, thyroid issues, hot flashes.  Once in a while "hot and cold sensation". Psych:  No mood changes, depression or anxiety. Pain:  Lower back ache (5 out of 10). Review of systems:  All other systems reviewed and found to be negative.  Physical Exam: Blood pressure (!) 150/88, pulse 81, temperature (!) 96.2 F (35.7 C), temperature source Tympanic, resp. rate 18, height 5' 1.81" (1.57 m), weight 90 lb 9.6 oz (41.1 kg). GENERAL:  Thin fatigued appearing woman  sitting comfortably in the exam room in no acute distress.  She appears older than stated age. MENTAL STATUS:  Alert and oriented to person, place and time. HEAD:  Long gray slightly wavy hair.  Normocephalic, atraumatic, face symmetric, no Cushingoid features. EYES:  Glasses.  Blue eyes.  Pupils equal round and reactive to light and  accomodation.  No conjunctivitis or scleral icterus. ENT:  Oropharynx clear without lesion.  Tongue normal.  Dentures.  Mucous membranes moist.  RESPIRATORY:  Clear to auscultation without rales, wheezes or rhonchi. CARDIOVASCULAR:  Regular rate and rhythm without murmur, rub or gallop. ABDOMEN:  Mild tenderness in the epigastric region without guarding or rebound tenderness.  Soft, with active bowel sounds, and no hepatosplenomegaly.  No masses. SKIN:  No rashes, ulcers or lesions. EXTREMITIES: Thin.  No edema, no skin discoloration or tenderness.  No palpable cords. LYMPH NODES: No palpable cervical, supraclavicular, axillary or inguinal adenopathy  NEUROLOGICAL: Unremarkable. PSYCH:  Appropriate.   No visits with results within 3 Day(s) from this visit.  Latest known visit with results is:  Admission on 06/22/2018, Discharged on 06/22/2018  Component Date Value Ref Range Status  . Sodium 06/22/2018 137  135 - 145 mmol/L Final  . Potassium 06/22/2018 3.7  3.5 - 5.1 mmol/L Final  . Chloride 06/22/2018 100  98 - 111 mmol/L Final  . CO2 06/22/2018 26  22 - 32 mmol/L Final  . Glucose, Bld 06/22/2018 74  70 - 99 mg/dL Final  . BUN 06/22/2018 8  6 - 20 mg/dL Final  . Creatinine, Ser 06/22/2018 0.84  0.44 - 1.00 mg/dL Final  . Calcium 06/22/2018 9.4  8.9 - 10.3 mg/dL Final  . GFR calc non Af Amer 06/22/2018 >60  >60 mL/min Final  . GFR calc Af Amer 06/22/2018 >60  >60 mL/min Final   Comment: (NOTE) The eGFR has been calculated using the CKD EPI equation. This calculation has not been validated in all clinical situations. eGFR's persistently <60 mL/min signify  possible Chronic Kidney Disease.   Georgiann Hahn gap 06/22/2018 11  5 - 15 Final   Performed at [facility], [address] [place], [place] [address]  . WBC 06/22/2018 11.2* 3.6 - 11.0 K/uL Final  . RBC 06/22/2018 4.65  3.80 - 5.20 MIL/uL Final  . Hemoglobin 06/22/2018 10.4* 12.0 - 16.0 g/dL Final  . HCT 06/22/2018 32.8* 35.0 - 47.0 % Final  . MCV 06/22/2018 70.4* 80.0 - 100.0 fL Final  . MCH 06/22/2018 22.3* 26.0 - 34.0 pg Final  . MCHC 06/22/2018 31.7* 32.0 - 36.0 g/dL Final  . RDW 06/22/2018 21.8* 11.5 - 14.5 % Final  . Platelets 06/22/2018 425  150 - 440 K/uL Final   Performed at [facility], [address], [place], [place] [address]  . Color, Urine 06/22/2018 STRAW* YELLOW Final  . APPearance 06/22/2018 CLEAR* CLEAR Final  . Specific Gravity, Urine 06/22/2018 1.004* 1.005 - 1.030 Final  . pH 06/22/2018 7.0  5.0 - 8.0 Final  . Glucose, UA 06/22/2018 NEGATIVE  NEGATIVE mg/dL Final  . Hgb urine dipstick 06/22/2018 SMALL* NEGATIVE Final   NO INTACT RBCS SEEN. QSD  . Bilirubin Urine 06/22/2018 NEGATIVE  NEGATIVE Final  . Ketones, ur 06/22/2018 NEGATIVE  NEGATIVE mg/dL Final  . Protein, ur 06/22/2018 NEGATIVE  NEGATIVE mg/dL Final  . Nitrite 06/22/2018 NEGATIVE  NEGATIVE Final  . Leukocytes, UA 06/22/2018 NEGATIVE  NEGATIVE Final  . WBC, UA 06/22/2018 0-5  0 - 5 WBC/hpf Final  . Bacteria, UA 06/22/2018 RARE* NONE SEEN Final  . Squamous Epithelial / LPF 06/22/2018 0-5  0 - 5 Final   Performed at [facility], [address], [place], [place] [address]  . Specimen Description 06/22/2018    Final  Value:URINE, RANDOM Performed at [facility], [address], [place], [place] [address]   . Special Requests 06/22/2018    Final                   Value:NONE Performed at [facility], [address] [place], [place] [address]   . Culture 06/22/2018 MULTIPLE SPECIES PRESENT, SUGGEST RECOLLECTION*  Final  . Report Status  06/22/2018 06/23/2018 FINAL   Final    Assessment:  DELSY ETZKORN is a 57 y.o. female with iron deficiency anemia secondary to an upper GI bleed.  She presented with intermittent melena and chest pain.  Hemoglobin nadir was 6.9 on 06/14/2018.  Ferritin was 9, iron saturation 11% and TIBC 393 on 06/20/2018.  She received 1 unit of PRBCs.  EGD on 06/14/2018 revealed esophageal mucosal changes c/w short segment of Barrett's and a non-bleeding gastric ulcers.  Pathology at the GE junction revealed squamocolumnar mucosa with moderate chronic inflammation.  Gastric biopsy revealed no H pylori, dysplasia, or malignancy.    Colonoscopy on 06/15/2018 revealed a 5 mm polyp in the sigmoid colon (tubular adenoma).  Repeat colonoscopy was discussed in 6-12 months due to fair prep.    Chest CT angiogram on 06/22/2018 reveled no pulmonary embolism.  There was underlying emphysematous changes.  There were small LUL pulmonary nodular changes (largest 3 mm).  There was no thoracic adenopathy.  She has not had a mammogram in several years.  Symptomatically, she feels fatigued.  She has a 15 pound weight loss.  She denies any bleeding.  She has ice pica.  Exam is unremarkable.  Plan: 1.  Discuss diagnosis and management of iron deficiency anemia. 2.  Labs today:  CBC with diff, ferritin, B12, folate, TSH. 3.  Iron deficiency anemia:  Patient with gastric ulcers.  Patient is intolerant of oral iron.  Discuss IV iron.  Discuss diet rich in iron.  Preauth Venofer. 4.  Weight loss:  Etiology unclear.  Patient has a history of smoking with recent negative chest CT angiogram.  Follow-up CT scan to assess tiny nodule in 6-12 months.  No mammogram in at least 4-5 years.  Schedule mammogram  Recent negative EGD and colonoscopy.  If work-up unrevealing and patient not gaining weight, may need abdomen and pelvic CT. 5.  Schedule mammogram. 6.  RTC after mammogram for MD assessment, review of work-up, and  initiation of IV iron.   Honor Loh, NP  07/05/2018, 12:25 PM   I saw and evaluated the patient, participating in the key portions of the service and reviewing pertinent diagnostic studies and records.  I reviewed the nurse practitioner's note and agree with the findings and the plan.  The assessment and plan were discussed with the patient.  Several questions were asked by the patient and answered.   Nolon Stalls, MD 07/05/2018,12:25 PM

## 2018-07-06 ENCOUNTER — Telehealth: Payer: Self-pay | Admitting: *Deleted

## 2018-07-06 ENCOUNTER — Other Ambulatory Visit: Payer: Self-pay | Admitting: *Deleted

## 2018-07-06 DIAGNOSIS — D500 Iron deficiency anemia secondary to blood loss (chronic): Secondary | ICD-10-CM

## 2018-07-06 NOTE — Telephone Encounter (Signed)
Called patient and LVM that B-12 is low. Patient advised to start on B-12 1000 mcg daily.  Will recheck at next visit and if not improved will most likely need B-12 injections.  Checked with L. Chrismon regarding PA for B-12. No PA required.  Sent scheduling message to add lab and +/- B-12 injection for 07-21-18 appointment.

## 2018-07-08 ENCOUNTER — Inpatient Hospital Stay: Payer: Medicare HMO | Admitting: Internal Medicine

## 2018-07-11 ENCOUNTER — Encounter: Payer: Self-pay | Admitting: Hematology and Oncology

## 2018-07-12 ENCOUNTER — Other Ambulatory Visit: Payer: Self-pay | Admitting: *Deleted

## 2018-07-12 DIAGNOSIS — D500 Iron deficiency anemia secondary to blood loss (chronic): Secondary | ICD-10-CM

## 2018-07-19 ENCOUNTER — Inpatient Hospital Stay: Payer: Medicare HMO | Attending: Hematology and Oncology

## 2018-07-19 ENCOUNTER — Ambulatory Visit
Admission: RE | Admit: 2018-07-19 | Discharge: 2018-07-19 | Disposition: A | Discharge status: Home / Self Care | Payer: Medicare HMO | Source: Ambulatory Visit | Attending: Urgent Care | Admitting: Urgent Care

## 2018-07-19 ENCOUNTER — Other Ambulatory Visit: Payer: Self-pay | Admitting: Hematology and Oncology

## 2018-07-19 ENCOUNTER — Other Ambulatory Visit: Payer: Self-pay | Admitting: Urgent Care

## 2018-07-19 DIAGNOSIS — M545 Low back pain: Secondary | ICD-10-CM | POA: Diagnosis not present

## 2018-07-19 DIAGNOSIS — E538 Deficiency of other specified B group vitamins: Secondary | ICD-10-CM | POA: Diagnosis not present

## 2018-07-19 DIAGNOSIS — N6310 Unspecified lump in the right breast, unspecified quadrant: Secondary | ICD-10-CM

## 2018-07-19 DIAGNOSIS — R634 Abnormal weight loss: Secondary | ICD-10-CM | POA: Insufficient documentation

## 2018-07-19 DIAGNOSIS — K625 Hemorrhage of anus and rectum: Secondary | ICD-10-CM | POA: Insufficient documentation

## 2018-07-19 DIAGNOSIS — K254 Chronic or unspecified gastric ulcer with hemorrhage: Secondary | ICD-10-CM | POA: Diagnosis not present

## 2018-07-19 DIAGNOSIS — Z8249 Family history of ischemic heart disease and other diseases of the circulatory system: Secondary | ICD-10-CM | POA: Insufficient documentation

## 2018-07-19 DIAGNOSIS — R51 Headache: Secondary | ICD-10-CM | POA: Insufficient documentation

## 2018-07-19 DIAGNOSIS — Z79899 Other long term (current) drug therapy: Secondary | ICD-10-CM | POA: Diagnosis not present

## 2018-07-19 DIAGNOSIS — Z1231 Encounter for screening mammogram for malignant neoplasm of breast: Secondary | ICD-10-CM

## 2018-07-19 DIAGNOSIS — D500 Iron deficiency anemia secondary to blood loss (chronic): Secondary | ICD-10-CM

## 2018-07-19 DIAGNOSIS — R109 Unspecified abdominal pain: Secondary | ICD-10-CM | POA: Diagnosis not present

## 2018-07-19 DIAGNOSIS — N644 Mastodynia: Secondary | ICD-10-CM

## 2018-07-19 DIAGNOSIS — F17210 Nicotine dependence, cigarettes, uncomplicated: Secondary | ICD-10-CM | POA: Insufficient documentation

## 2018-07-19 DIAGNOSIS — R918 Other nonspecific abnormal finding of lung field: Secondary | ICD-10-CM | POA: Insufficient documentation

## 2018-07-19 DIAGNOSIS — R079 Chest pain, unspecified: Secondary | ICD-10-CM | POA: Diagnosis not present

## 2018-07-19 LAB — CBC WITH DIFFERENTIAL/PLATELET
Basophils Absolute: 0 10*3/uL (ref 0–0.1)
Basophils Relative: 1 %
Eosinophils Absolute: 0 10*3/uL (ref 0–0.7)
Eosinophils Relative: 1 %
HCT: 30.5 % — ABNORMAL LOW (ref 35.0–47.0)
Hemoglobin: 9.9 g/dL — ABNORMAL LOW (ref 12.0–16.0)
Lymphocytes Relative: 39 %
Lymphs Abs: 2 10*3/uL (ref 1.0–3.6)
MCH: 23.1 pg — ABNORMAL LOW (ref 26.0–34.0)
MCHC: 32.5 g/dL (ref 32.0–36.0)
MCV: 71.2 fL — ABNORMAL LOW (ref 80.0–100.0)
Monocytes Absolute: 0.6 10*3/uL (ref 0.2–0.9)
Monocytes Relative: 11 %
Neutro Abs: 2.4 10*3/uL (ref 1.4–6.5)
Neutrophils Relative %: 48 %
Platelets: 438 10*3/uL (ref 150–440)
RBC: 4.28 MIL/uL (ref 3.80–5.20)
RDW: 22.2 % — ABNORMAL HIGH (ref 11.5–14.5)
WBC: 5.1 10*3/uL (ref 3.6–11.0)

## 2018-07-19 LAB — FERRITIN: Ferritin: 5 ng/mL — ABNORMAL LOW (ref 11–307)

## 2018-07-19 LAB — VITAMIN B12: Vitamin B-12: 396 pg/mL (ref 180–914)

## 2018-07-21 ENCOUNTER — Inpatient Hospital Stay: Payer: Medicare HMO

## 2018-07-21 ENCOUNTER — Encounter: Payer: Self-pay | Admitting: Hematology and Oncology

## 2018-07-21 ENCOUNTER — Inpatient Hospital Stay (HOSPITAL_BASED_OUTPATIENT_CLINIC_OR_DEPARTMENT_OTHER): Payer: Medicare HMO | Admitting: Hematology and Oncology

## 2018-07-21 VITALS — BP 134/85 | HR 75 | Temp 96.7°F | Wt 95.0 lb

## 2018-07-21 DIAGNOSIS — R634 Abnormal weight loss: Secondary | ICD-10-CM | POA: Diagnosis not present

## 2018-07-21 DIAGNOSIS — K254 Chronic or unspecified gastric ulcer with hemorrhage: Secondary | ICD-10-CM | POA: Diagnosis not present

## 2018-07-21 DIAGNOSIS — Z79899 Other long term (current) drug therapy: Secondary | ICD-10-CM | POA: Diagnosis not present

## 2018-07-21 DIAGNOSIS — E538 Deficiency of other specified B group vitamins: Secondary | ICD-10-CM | POA: Diagnosis not present

## 2018-07-21 DIAGNOSIS — F17210 Nicotine dependence, cigarettes, uncomplicated: Secondary | ICD-10-CM | POA: Diagnosis not present

## 2018-07-21 DIAGNOSIS — M545 Low back pain: Secondary | ICD-10-CM

## 2018-07-21 DIAGNOSIS — R079 Chest pain, unspecified: Secondary | ICD-10-CM | POA: Diagnosis not present

## 2018-07-21 DIAGNOSIS — D509 Iron deficiency anemia, unspecified: Secondary | ICD-10-CM | POA: Diagnosis not present

## 2018-07-21 DIAGNOSIS — R51 Headache: Secondary | ICD-10-CM

## 2018-07-21 DIAGNOSIS — K625 Hemorrhage of anus and rectum: Secondary | ICD-10-CM | POA: Diagnosis not present

## 2018-07-21 DIAGNOSIS — R109 Unspecified abdominal pain: Secondary | ICD-10-CM | POA: Diagnosis not present

## 2018-07-21 DIAGNOSIS — R918 Other nonspecific abnormal finding of lung field: Secondary | ICD-10-CM | POA: Diagnosis not present

## 2018-07-21 DIAGNOSIS — D500 Iron deficiency anemia secondary to blood loss (chronic): Secondary | ICD-10-CM | POA: Diagnosis not present

## 2018-07-21 NOTE — Progress Notes (Signed)
[facility] day:  07/21/2018  Chief Complaint: Molly Peters is a 57 y.o. female with iron deficiency anemia and B12 deficiency who is seen for review of initial work-up and initiation of IV iron.  HPI:  The patient was last seen in the medical oncology office on 07/05/2018 for initial consultation.  She was felt to have iron deficiency anemia secondary to an upper GI bleed.  She had gastric ulcers and was intolerant of oral iron.  Venofer was preauthorized.  She can unexplained weight loss.  Labs revealed a hematocrit of 31, hemoglobin 10.2, MCV 70.8, platelets 477,000, WBC 5700 with an ANC of 2300.  Ferritin was 7.  B12 was 165.  Folate was 6.3 (> 5.9).  TSH was 1.256.  She was contacted to begin oral B12 1000 mcg a day on 07/06/2018.  Labs on 07/19/2018 revealed a hematocrit of 30.5, hemoglobin 9.9, and MCV 71.2.  Ferritin was 5.  B12 was 396.  During the interim, she notes that her energy level is better.  She has had some headaches.  She denies any bleeding.   Past Medical History:  Diagnosis Date  . Anxiety   . Cervical cancer ([place])   . Chronic back pain 11/12/2009   Qualifier: Diagnosis of  By: Maxie Better FNP, Rosalita Levan   . COPD (chronic obstructive pulmonary disease) (HCC)    states SOB with ADLs; no O2 use; able to speak in complete sentences without SOB(11/15/2014)  . Depression   . Difficulty swallowing pills    s/p cervical fusion  . Family history of adverse reaction to anesthesia    pt's mother and sister have hx. of post-op N/V  . Fibromyalgia   . GERD (gastroesophageal reflux disease)   . History of gastric ulcer   . Hypertension    states under control with med., has been on med. x 1-2 yr.  . IBS (irritable bowel syndrome)   . Internal hemorrhoid    states has had intermittent bright red bleeding with BM (11/15/2014)  . Limited joint range of motion    neck - s/p cervical fusion  . Localized primary osteoarthritis  of right shoulder region 11/23/2014  . Osteoarthritis of left shoulder 07/05/2015  . Osteoarthritis of right shoulder 11/2014  . Seizures ([place])    last seizure 2010  . Short-term memory loss   . TMJ syndrome   . Valvular heart disease    Initial workup a number of years ago at [facility].  Echo (10/2009) showed EF 60-65%,      normal LV size, moderate aortic insufficiency with a trileaflet aortic valve and normal aortic root size.  Mild      mitral regurgitation.   . Wears dentures    lower    Past Surgical History:  Procedure Laterality Date  . ABDOMINAL HYSTERECTOMY     partial  . ANTERIOR CERVICAL DECOMP/DISCECTOMY FUSION  02/06/2011   C4-5, C5-6, C6-7  . BREAST BIOPSY Bilateral 10+ yrs ago   neg  . BREAST BIOPSY Right 08/03/2018   2 areas bx, path pending  . BREAST LUMPECTOMY Bilateral 1989   x 3 - benign  . CARDIAC CATHETERIZATION  1995  . CESAREAN SECTION     x 2  . COLONOSCOPY  09/28/2008  . COLONOSCOPY N/A 06/15/2018   Procedure: COLONOSCOPY;  Surgeon: Virgel Manifold, MD;  Location: [facility] ENDOSCOPY;  Service: Endoscopy;  Laterality: N/A;  . ESOPHAGOGASTRODUODENOSCOPY  09/28/2008  . ESOPHAGOGASTRODUODENOSCOPY N/A 06/14/2018  Procedure: ESOPHAGOGASTRODUODENOSCOPY (EGD);  Surgeon: Virgel Manifold, MD;  Location: [facility] ENDOSCOPY;  Service: Endoscopy;  Laterality: N/A;  . HARDWARE REMOVAL  11/17/2006   L5-S1  . LAMINECTOMY WITH POSTERIOR LATERAL ARTHRODESIS LEVEL 3  12/06/2009   with synovial cyst resection L3-4 bilat.  Marland Kitchen LAMINECTOMY WITH POSTERIOR LATERAL ARTHRODESIS LEVEL 3  11/17/2006   L4-5  . NM MYOVIEW LTD     Lexiscan myoview (10/2009): EF 77%, normal wall motion, normal perfusion.   Marland Kitchen SHOULDER ARTHROSCOPY WITH DEBRIDEMENT AND BICEP TENDON REPAIR Right 04/13/2014   Procedure: RIGHT SHOULDER ARTHROSCOPY WITH DEBRIDEMENT EXTENSIVE;  Surgeon: Johnny Bridge, MD;  Location: [place];  Service: Orthopedics;  Laterality: Right;  . TOTAL SHOULDER ARTHROPLASTY  Right 11/23/2014   Procedure: TOTAL RIGHT SHOULDER ARTHROPLASTY;  Surgeon: Johnny Bridge, MD;  Location: [place];  Service: Orthopedics;  Laterality: Right;  . TOTAL SHOULDER ARTHROPLASTY Left 07/05/2015   Procedure: LEFT TOTAL SHOULDER REPLACEMENT;  Surgeon: Marchia Bond, MD;  Location: [place];  Service: Orthopedics;  Laterality: Left;    Family History  Problem Relation Age of Onset  . Cervical cancer Mother   . Anesthesia problems Mother        post-op N/V  . Rheum arthritis Mother   . Anxiety disorder Mother   . Cancer Father        colon cancer  . Migraines Sister   . Cervical cancer Sister   . Anesthesia problems Sister        post-op N/V  . Migraines Brother   . Asthma Daughter   . Cerebral palsy Daughter        age 60  . Coronary artery disease Maternal Grandmother   . Hypertension Maternal Grandmother   . Diabetes Maternal Grandmother   . Coronary artery disease Paternal Grandmother   . Hypertension Paternal Grandmother   . Diabetes Paternal Grandmother   . Breast cancer Maternal Aunt   . Breast cancer Maternal Aunt   . Breast cancer Maternal Aunt     Social History:  reports that she has been smoking cigarettes. She has a 63.00 pack-year smoking history. She has never used smokeless tobacco. She reports that she has current or past drug history. Drug: Marijuana. She reports that she does not drink alcohol.  Patient smoked 1.5 packs of cigarettes per day for 40+ years. She drinks "a hair" every now and then. Patient has been on disability in 2005 for "ruptured back". She was a former [employer]. The patient is accompanied by her husband, Fritz Pickerel, today.  Allergies:  Allergies  Allergen Reactions  . Carbamazepine Hives  . Divalproex Sodium Swelling    HAIR FALLS OUT  . Clonazepam Other (See Comments)    HALLUCINATIONS HALLUCINATIONS HALLUCINATIONS  . Divalproex Sodium Swelling    HAIR FALLS OUT  . Diphenhydramine Hcl Other (See  Comments)    UNKNOWN  . Diphenhydramine Hcl Rash and Other (See Comments)    UNKNOWN UNKNOWN  . Hydrocodone-Acetaminophen Itching  . Tiotropium Other (See Comments)    CREATES YEAST IN THROAT  . Tiotropium Bromide Monohydrate Other (See Comments)    CREATES YEAST IN THROAT  . Vicodin [Hydrocodone-Acetaminophen] Itching    Current Medications: Current Outpatient Medications  Medication Sig Dispense Refill  . ALPRAZolam (XANAX) 1 MG tablet TAKE 1 TABLET THREE TIMES DAILY AS NEEDED FOR ANXIETY 270 tablet 0  . gabapentin (NEURONTIN) 300 MG capsule Take 4 capsules (1,200 mg total) by mouth at bedtime.    Marland Kitchen  lamoTRIgine (LAMICTAL) 100 MG tablet TAKE 1 TABLET TWICE DAILY 180 tablet 1  . losartan-hydrochlorothiazide (HYZAAR) 100-12.5 MG tablet TAKE 1 TABLET EVERY DAY (Patient taking differently: Take by mouth daily. ) 90 tablet 1  . naproxen (NAPROSYN) 500 MG tablet Take 1 tablet (500 mg total) by mouth 2 (two) times daily with a meal. 20 tablet 0  . pantoprazole (PROTONIX) 40 MG tablet Take 1 tablet (40 mg total) by mouth 2 (two) times daily. 180 tablet 0  . PARoxetine (PAXIL) 20 MG tablet TAKE 1 TABLET EVERY MORNING 90 tablet 3  . nicotine (NICODERM CQ - DOSED IN MG/24 HOURS) 14 mg/24hr patch Place 1 patch (14 mg total) onto the skin daily. (Patient not taking: Reported on 07/21/2018) 28 patch 0   No current facility-administered medications for this visit.     Review of Systems:  GENERAL:  Energy is "a little better".  No fevers, sweats.  Weight up 5 pounds. PERFORMANCE STATUS (ECOG):  1 HEENT:  No visual changes, runny nose, sore throat, mouth sores or tenderness. Lungs: Shortness of breath with exertion.  Cough.  No hemoptysis. Cardiac:  No chest pain, palpitations, orthopnea, or PND. GI:  Diarrhea alternating with constipation.  No nausea, vomiting,melena or hematochezia. GU:  No urgency, frequency, dysuria, or hematuria. Musculoskeletal:  Neck sore.  Bones and joints ache.  s/p  shoulder replacement.  Prior bback pain. No muscle tenderness. Extremities:  No pain or swelling. Skin:  No rashes or skin changes. Neuro:  Headaches.  No numbness or weakness, balance or coordination issues. Endocrine:  No diabetes, thyroid issues, hot flashes or night sweats. Psych:  No mood changes, depression or anxiety. Pain:  Low back pain. Review of systems:  All other systems reviewed and found to be negative.   Physical Exam: Blood pressure 134/85, pulse 75, temperature (!) 96.7 F (35.9 C), temperature source Tympanic, weight 95 lb (43.1 kg). GENERAL:  Thin woman sitting comfortably in the exam room in no acute distress.  She appears older than stated age. MENTAL STATUS:  Alert and oriented to person, place and time. HEAD:  Pearline Cables hair.  Normocephalic, atraumatic, face symmetric, no Cushingoid features. EYES:  Glasses.  Blue eyes.  Pupils equal round and reactive to light and accomodation.  No conjunctivitis or scleral icterus. NECK:  Tender sternocleidomastoid. RESPIRATORY:  Clear to auscultation without rales, wheezes or rhonchi. CARDIOVASCULAR:  Regular rate and rhythm without murmur, rub or gallop. ABDOMEN:  Soft, non-tender, with active bowel sounds, and no hepatosplenomegaly.  No masses. SKIN:  No rashes, ulcers or lesions. EXTREMITIES: Thin.  No edema, no skin discoloration or tenderness.  No palpable cords. LYMPH NODES: No palpable cervical, supraclavicular, axillary or inguinal adenopathy  NEUROLOGICAL: Unremarkable. PSYCH:  Appropriate.    Appointment on 07/19/2018  Component Date Value Ref Range Status  . Ferritin 07/19/2018 5* 11 - 307 ng/mL Final   Performed at [facility], [address] [place], [place] [address]  . WBC 07/19/2018 5.1  3.6 - 11.0 K/uL Final  . RBC 07/19/2018 4.28  3.80 - 5.20 MIL/uL Final  . Hemoglobin 07/19/2018 9.9* 12.0 - 16.0 g/dL Final  . HCT 07/19/2018 30.5* 35.0 - 47.0 % Final  . MCV 07/19/2018 71.2* 80.0 - 100.0 fL Final   . MCH 07/19/2018 23.1* 26.0 - 34.0 pg Final  . MCHC 07/19/2018 32.5  32.0 - 36.0 g/dL Final  . RDW 07/19/2018 22.2* 11.5 - 14.5 % Final  . Platelets 07/19/2018 438  150 - 440 K/uL  Final  . Neutrophils Relative % 07/19/2018 48  % Final  . Neutro Abs 07/19/2018 2.4  1.4 - 6.5 K/uL Final  . Lymphocytes Relative 07/19/2018 39  % Final  . Lymphs Abs 07/19/2018 2.0  1.0 - 3.6 K/uL Final  . Monocytes Relative 07/19/2018 11  % Final  . Monocytes Absolute 07/19/2018 0.6  0.2 - 0.9 K/uL Final  . Eosinophils Relative 07/19/2018 1  % Final  . Eosinophils Absolute 07/19/2018 0.0  0 - 0.7 K/uL Final  . Basophils Relative 07/19/2018 1  % Final  . Basophils Absolute 07/19/2018 0.0  0 - 0.1 K/uL Final   Performed at [facility], [address], [place], [place] [address]  . Vitamin B-12 07/19/2018 396  180 - 914 pg/mL Final   Comment: (NOTE) This assay is not validated for testing neonatal or myeloproliferative syndrome specimens for Vitamin B12 levels. Performed at [facility], [place] [address], [place],  [address]     Assessment:  CECILY LAWHORNE is a 57 y.o. female with iron deficiency anemia secondary to an upper GI bleed.  She presented with intermittent melena and chest pain.  Hemoglobin nadir was 6.9 on 06/14/2018.  Ferritin was 9, iron saturation 11% and TIBC 393 on 06/20/2018.  She received 1 unit of PRBCs.  Work-up on 07/05/2018 revealed a hematocrit of 31, hemoglobin 10.2, and MCV 70.8.  Ferritin was 7.  B12 was 165 (low).  Folate was 6.3 (> 5.9).  TSH was 1.256.  Ferritin has been followed: 9 on 06/20/2018, 7 on 07/05/2018 and 5 on 07/19/2018.  She has B12 deficiency.  She began oral B12 on 07/06/2018.  EGD on 06/14/2018 revealed esophageal mucosal changes c/w short segment of Barrett's and a non-bleeding gastric ulcers.  Pathology at the GE junction revealed squamocolumnar mucosa with moderate chronic inflammation.  Gastric biopsy revealed no H pylori, dysplasia,  or malignancy.    Colonoscopy on 06/15/2018 revealed a 5 mm polyp in the sigmoid colon (tubular adenoma).  Repeat colonoscopy was discussed in 6-12 months due to fair prep.    Chest CT angiogram on 06/22/2018 reveled no pulmonary embolism.  There was underlying emphysematous changes.  There were small LUL pulmonary nodular changes (largest 3 mm).  There was no thoracic adenopathy.  She has not had a mammogram in several years.  Symptomatically, she feels a little better.  She has a headache today.  Exam is unremarkable.  Plan: 1.  Review initial work-up.  Iron deficiency anemia and B12 deficiency. 2.  Iron deficiency anemia:  Discuss declining hemoglobin and no improvement in ferritin on oral iron.  Discuss initiation of IV iron.  Potential side effects reviewed.  RTC weekly x 4 for Venofer. 3.  B12 deficiency:  Discuss continuation of oral B12  Check B12 level in 1 month. 4.  Health maintenance:  Schedule bilateral mammogram. 5.  Small pulmonary nodules:  Anticipate follow-up CT scan on 06/23/2019. 6.  RTC in 7 weeks for MD assessment, labs (CBC with diff, ferritin, B12, folate), and +/- IV iron.   Lequita Asal, MD  07/21/2018, 9:09 PM

## 2018-07-21 NOTE — Progress Notes (Signed)
Patent states she has been having a lot of headaches.

## 2018-07-25 ENCOUNTER — Inpatient Hospital Stay: Payer: Medicare HMO

## 2018-07-25 VITALS — BP 133/79 | HR 74 | Temp 98.0°F | Resp 18

## 2018-07-25 DIAGNOSIS — D500 Iron deficiency anemia secondary to blood loss (chronic): Secondary | ICD-10-CM

## 2018-07-25 DIAGNOSIS — R918 Other nonspecific abnormal finding of lung field: Secondary | ICD-10-CM | POA: Diagnosis not present

## 2018-07-25 DIAGNOSIS — R109 Unspecified abdominal pain: Secondary | ICD-10-CM | POA: Diagnosis not present

## 2018-07-25 DIAGNOSIS — K625 Hemorrhage of anus and rectum: Secondary | ICD-10-CM | POA: Diagnosis not present

## 2018-07-25 DIAGNOSIS — K254 Chronic or unspecified gastric ulcer with hemorrhage: Secondary | ICD-10-CM | POA: Diagnosis not present

## 2018-07-25 DIAGNOSIS — R51 Headache: Secondary | ICD-10-CM | POA: Diagnosis not present

## 2018-07-25 DIAGNOSIS — E538 Deficiency of other specified B group vitamins: Secondary | ICD-10-CM | POA: Diagnosis not present

## 2018-07-25 DIAGNOSIS — R634 Abnormal weight loss: Secondary | ICD-10-CM | POA: Diagnosis not present

## 2018-07-25 DIAGNOSIS — R079 Chest pain, unspecified: Secondary | ICD-10-CM | POA: Diagnosis not present

## 2018-07-25 MED ORDER — SODIUM CHLORIDE 0.9 % IV SOLN
Freq: Once | INTRAVENOUS | Status: AC
  Administered 2018-07-25: 14:00:00 via INTRAVENOUS
  Filled 2018-07-25: qty 250

## 2018-07-25 MED ORDER — IRON SUCROSE 20 MG/ML IV SOLN
200.0000 mg | Freq: Once | INTRAVENOUS | Status: AC
  Administered 2018-07-25: 200 mg via INTRAVENOUS
  Filled 2018-07-25: qty 10

## 2018-07-25 MED ORDER — SODIUM CHLORIDE 0.9 % IV SOLN: 200.0000 mg | Freq: Once | INTRAVENOUS | Status: DC

## 2018-07-27 ENCOUNTER — Ambulatory Visit
Admission: RE | Admit: 2018-07-27 | Discharge: 2018-07-27 | Disposition: A | Discharge status: Home / Self Care | Payer: Medicare HMO | Source: Ambulatory Visit | Attending: Urgent Care | Admitting: Urgent Care

## 2018-07-27 ENCOUNTER — Other Ambulatory Visit: Payer: Self-pay | Admitting: Urgent Care

## 2018-07-27 DIAGNOSIS — N6310 Unspecified lump in the right breast, unspecified quadrant: Secondary | ICD-10-CM

## 2018-07-27 DIAGNOSIS — N6489 Other specified disorders of breast: Secondary | ICD-10-CM | POA: Diagnosis not present

## 2018-07-27 DIAGNOSIS — N644 Mastodynia: Secondary | ICD-10-CM

## 2018-07-27 DIAGNOSIS — N6312 Unspecified lump in the right breast, upper inner quadrant: Secondary | ICD-10-CM | POA: Diagnosis not present

## 2018-07-27 DIAGNOSIS — R922 Inconclusive mammogram: Secondary | ICD-10-CM | POA: Diagnosis not present

## 2018-07-27 DIAGNOSIS — R928 Other abnormal and inconclusive findings on diagnostic imaging of breast: Secondary | ICD-10-CM

## 2018-07-27 DIAGNOSIS — N6314 Unspecified lump in the right breast, lower inner quadrant: Secondary | ICD-10-CM | POA: Diagnosis not present

## 2018-07-27 HISTORY — DX: Malignant neoplasm of cervix uteri, unspecified: C53.9

## 2018-08-01 ENCOUNTER — Inpatient Hospital Stay: Payer: Medicare HMO

## 2018-08-03 ENCOUNTER — Ambulatory Visit
Admission: RE | Admit: 2018-08-03 | Discharge: 2018-08-03 | Disposition: A | Discharge status: Home / Self Care | Payer: Medicare HMO | Source: Ambulatory Visit | Attending: Urgent Care | Admitting: Urgent Care

## 2018-08-03 DIAGNOSIS — N6041 Mammary duct ectasia of right breast: Secondary | ICD-10-CM | POA: Diagnosis not present

## 2018-08-03 DIAGNOSIS — R928 Other abnormal and inconclusive findings on diagnostic imaging of breast: Secondary | ICD-10-CM

## 2018-08-03 DIAGNOSIS — N6310 Unspecified lump in the right breast, unspecified quadrant: Secondary | ICD-10-CM | POA: Insufficient documentation

## 2018-08-03 DIAGNOSIS — N6312 Unspecified lump in the right breast, upper inner quadrant: Secondary | ICD-10-CM | POA: Diagnosis not present

## 2018-08-03 DIAGNOSIS — N6341 Unspecified lump in right breast, subareolar: Secondary | ICD-10-CM | POA: Diagnosis not present

## 2018-08-03 DIAGNOSIS — D241 Benign neoplasm of right breast: Secondary | ICD-10-CM | POA: Diagnosis not present

## 2018-08-03 HISTORY — PX: BREAST BIOPSY: SHX20

## 2018-08-04 LAB — SURGICAL PATHOLOGY

## 2018-08-06 ENCOUNTER — Encounter: Payer: Self-pay | Admitting: Hematology and Oncology

## 2018-08-07 NOTE — Progress Notes (Signed)
[facility] day:  08/08/18  Chief Complaint: Molly Peters is a 57 y.o. female with iron deficiency anemia and B12 deficiency who is seen for review of recent mammogram and biopsy and discussion regarding direction of therapy.   HPI:  The patient was last seen in the medical oncology office on 07/21/2018. At that time, energy had improved some. She complained of non-specific headaches. No bleeding. Exam was unremarkable. She was scheduled for a mammogram and intravenous iron replacement.   Patient received Venofer 200 mg IV on 07/25/2018. She will receive #2/4 today, #3/4 on 08/15/2018, and #4/4 on 08/22/2018.  BILATERAL mammogram done on 07/27/2018 revealed 2 indeterminate masses in the RIGHT breast at the 2:30 and 3 o'clock positions. Follow up breast ultrasound shows a 6 x 4 x 5 mm oval hypoechoic mass, 1 cm from the nipple, in the 2:30 position. In the 3 o'clock position, again 1 cm from the nipple, the was a 6 x 3 x 3 mm oval hypoechoic mass. LEFT breast ultrasound showed normal fibroglandular tissue at the 10 o'clock position.  Patient underwent ultrasound guided needle biopsy with clip placement on 08/03/2018. Procedure notes reviewed. Procedure noted to be uncomplicated. Pathology on the biopsied lesions is as follows: 1. Lesion in the 2:30 position revealed a dilated duct/cyst with sclerosis of the wall. Sample negative for atypia and malignancy.   2. Lesion in the 3 o'clock position revealed an intraductal papilloma. Sample negative for atypia and malignancy.  In the interim, she notes significant rectal bleeding since 08/06/2018.  She notes recurrent gushes of blood.  Toilet bowel has been filled with blood.  She notes fatigue and shortness of breath.  She denies any chest pain, palpitations or dizziness.  She has had lower abdominal discomfort.   Past Medical History:  Diagnosis Date  . Anxiety   . Cervical cancer ([place])   . Chronic  back pain 11/12/2009   Qualifier: Diagnosis of  By: Maxie Better FNP, Rosalita Levan   . COPD (chronic obstructive pulmonary disease) ([facility])    states SOB with ADLs; no O2 use; able to speak in complete sentences without SOB(11/15/2014)  . Depression   . Difficulty swallowing pills    s/p cervical fusion  . Family history of adverse reaction to anesthesia    pt's mother and sister have hx. of post-op N/V  . Fibromyalgia   . GERD (gastroesophageal reflux disease)   . History of gastric ulcer   . Hypertension    states under control with med., has been on med. x 1-2 yr.  . IBS (irritable bowel syndrome)   . Internal hemorrhoid    states has had intermittent bright red bleeding with BM (11/15/2014)  . Limited joint range of motion    neck - s/p cervical fusion  . Localized primary osteoarthritis of right shoulder region 11/23/2014  . Osteoarthritis of left shoulder 07/05/2015  . Osteoarthritis of right shoulder 11/2014  . Seizures ([place])    last seizure 2010  . Short-term memory loss   . TMJ syndrome   . Valvular heart disease    Initial workup a number of years ago at [facility].  Echo (10/2009) showed EF 60-65%,      normal LV size, moderate aortic insufficiency with a trileaflet aortic valve and normal aortic root size.  Mild      mitral regurgitation.   . Wears dentures    lower    Past Surgical History:  Procedure Laterality Date  .  ABDOMINAL HYSTERECTOMY     partial  . ANTERIOR CERVICAL DECOMP/DISCECTOMY FUSION  02/06/2011   C4-5, C5-6, C6-7  . BREAST BIOPSY Bilateral 10+ yrs ago   neg  . BREAST BIOPSY Right 08/03/2018   2 areas bx, path pending  . BREAST LUMPECTOMY Bilateral 1989   x 3 - benign  . CARDIAC CATHETERIZATION  1995  . CESAREAN SECTION     x 2  . COLONOSCOPY  09/28/2008  . COLONOSCOPY N/A 06/15/2018   Procedure: COLONOSCOPY;  Surgeon: Virgel Manifold, MD;  Location: [facility] ENDOSCOPY;  Service: Endoscopy;  Laterality: N/A;  . ESOPHAGOGASTRODUODENOSCOPY  09/28/2008  .  ESOPHAGOGASTRODUODENOSCOPY N/A 06/14/2018   Procedure: ESOPHAGOGASTRODUODENOSCOPY (EGD);  Surgeon: Virgel Manifold, MD;  Location: [facility] ENDOSCOPY;  Service: Endoscopy;  Laterality: N/A;  . HARDWARE REMOVAL  11/17/2006   L5-S1  . LAMINECTOMY WITH POSTERIOR LATERAL ARTHRODESIS LEVEL 3  12/06/2009   with synovial cyst resection L3-4 bilat.  Marland Kitchen LAMINECTOMY WITH POSTERIOR LATERAL ARTHRODESIS LEVEL 3  11/17/2006   L4-5  . NM MYOVIEW LTD     Lexiscan myoview (10/2009): EF 77%, normal wall motion, normal perfusion.   Marland Kitchen SHOULDER ARTHROSCOPY WITH DEBRIDEMENT AND BICEP TENDON REPAIR Right 04/13/2014   Procedure: RIGHT SHOULDER ARTHROSCOPY WITH DEBRIDEMENT EXTENSIVE;  Surgeon: Johnny Bridge, MD;  Location: [place];  Service: Orthopedics;  Laterality: Right;  . TOTAL SHOULDER ARTHROPLASTY Right 11/23/2014   Procedure: TOTAL RIGHT SHOULDER ARTHROPLASTY;  Surgeon: Johnny Bridge, MD;  Location: [place];  Service: Orthopedics;  Laterality: Right;  . TOTAL SHOULDER ARTHROPLASTY Left 07/05/2015   Procedure: LEFT TOTAL SHOULDER REPLACEMENT;  Surgeon: Marchia Bond, MD;  Location: [place];  Service: Orthopedics;  Laterality: Left;    Family History  Problem Relation Age of Onset  . Cervical cancer Mother   . Anesthesia problems Mother        post-op N/V  . Rheum arthritis Mother   . Anxiety disorder Mother   . Cancer Father        colon cancer  . Migraines Sister   . Cervical cancer Sister   . Anesthesia problems Sister        post-op N/V  . Migraines Brother   . Asthma Daughter   . Cerebral palsy Daughter        age 59  . Coronary artery disease Maternal Grandmother   . Hypertension Maternal Grandmother   . Diabetes Maternal Grandmother   . Coronary artery disease Paternal Grandmother   . Hypertension Paternal Grandmother   . Diabetes Paternal Grandmother   . Breast cancer Maternal Aunt   . Breast cancer Maternal Aunt   . Breast cancer  Maternal Aunt     Social History:  reports that she has been smoking cigarettes. She has a 63.00 pack-year smoking history. She has never used smokeless tobacco. She reports that she has current or past drug history. Drug: Marijuana. She reports that she does not drink alcohol.  Patient smoked 1.5 packs of cigarettes per day for 40+ years. She drinks "a hair" every now and then. Patient has been on disability in 2005 for "ruptured back". She was a former [employer]. The patient is accompanied by her husband, Fritz Pickerel, today.  Allergies:  Allergies  Allergen Reactions  . Carbamazepine Hives  . Divalproex Sodium Swelling    HAIR FALLS OUT  . Clonazepam Other (See Comments)    HALLUCINATIONS HALLUCINATIONS HALLUCINATIONS  . Divalproex Sodium Swelling    HAIR FALLS OUT  .  Diphenhydramine Hcl Other (See Comments)    UNKNOWN  . Diphenhydramine Hcl Rash and Other (See Comments)    UNKNOWN UNKNOWN  . Hydrocodone-Acetaminophen Itching  . Tiotropium Other (See Comments)    CREATES YEAST IN THROAT  . Tiotropium Bromide Monohydrate Other (See Comments)    CREATES YEAST IN THROAT  . Vicodin [Hydrocodone-Acetaminophen] Itching    Current Medications: Current Outpatient Medications  Medication Sig Dispense Refill  . ALPRAZolam (XANAX) 1 MG tablet TAKE 1 TABLET THREE TIMES DAILY AS NEEDED FOR ANXIETY (Patient taking differently: Take 1 mg by mouth 3 (three) times daily as needed for anxiety. ) 270 tablet 0  . gabapentin (NEURONTIN) 300 MG capsule Take 4 capsules (1,200 mg total) by mouth at bedtime.    . lamoTRIgine (LAMICTAL) 100 MG tablet TAKE 1 TABLET TWICE DAILY 180 tablet 1  . losartan-hydrochlorothiazide (HYZAAR) 100-12.5 MG tablet TAKE 1 TABLET EVERY DAY (Patient taking differently: Take 1 tablet by mouth daily. ) 90 tablet 1  . pantoprazole (PROTONIX) 40 MG tablet Take 1 tablet (40 mg total) by mouth 2 (two) times daily. 180 tablet 0  . PARoxetine (PAXIL) 20 MG tablet TAKE 1 TABLET EVERY MORNING  90 tablet 3  . amoxicillin-clavulanate (AUGMENTIN) 875-125 MG tablet Take 1 tablet by mouth 2 (two) times daily for 7 days. 14 tablet 0  . metroNIDAZOLE (FLAGYL) 500 MG tablet Take 1 tablet (500 mg total) by mouth 3 (three) times daily for 7 days. 21 tablet 0  . nicotine (NICODERM CQ - DOSED IN MG/24 HOURS) 14 mg/24hr patch Place 1 patch (14 mg total) onto the skin daily. (Patient not taking: Reported on 07/21/2018) 28 patch 0  . ondansetron (ZOFRAN ODT) 4 MG disintegrating tablet Take 1 tablet (4 mg total) by mouth every 8 (eight) hours as needed for nausea or vomiting. 20 tablet 0   No current facility-administered medications for this visit.     Review of Systems  Constitutional: Positive for malaise/fatigue and weight loss (5 pound). Negative for diaphoresis and fever.  HENT: Negative.  Negative for congestion, ear discharge, ear pain, hearing loss, nosebleeds, sinus pain and sore throat.   Eyes: Negative.  Negative for blurred vision, double vision, photophobia, discharge and redness.  Respiratory: Positive for shortness of breath (exertional). Negative for cough, hemoptysis and sputum production.   Cardiovascular: Negative.  Negative for chest pain, palpitations, orthopnea, leg swelling and PND.  Gastrointestinal: Positive for abdominal pain and blood in stool. Negative for constipation, diarrhea, melena, nausea and vomiting.       Significant rectal bleeding since 08/06/2018.  Genitourinary: Negative.  Negative for dysuria, hematuria and urgency.  Musculoskeletal: Positive for joint pain and neck pain. Negative for back pain, falls and myalgias.  Skin: Negative.  Negative for itching and rash.       Recent RIGHT breast biopsy  Neurological: Negative for dizziness, tingling, tremors, sensory change, focal weakness, weakness and headaches.  Endo/Heme/Allergies: Does not bruise/bleed easily.  Psychiatric/Behavioral: Negative for depression and memory loss. The patient is not nervous/anxious  and does not have insomnia.   All other systems reviewed and are negative.  Performance status (ECOG): 1 - Symptomatic but completely ambulatory  Vital Signs: BP 119/81   Pulse 76   Temp (!) 96.7 F (35.9 C) (Tympanic)   Resp 18   Wt 90 lb 6 oz (41 kg)   SpO2 98%   BMI 16.63 kg/m   Physical Exam  Constitutional: She is oriented to person, place, and time and well-developed, well-nourished,  and in no distress. No distress.  HENT:  Head: Normocephalic and atraumatic.  Long gray hair.  Eyes: Pupils are equal, round, and reactive to light. Conjunctivae and EOM are normal. No scleral icterus.  Glasses. Blue eyes.   Neck: Normal range of motion. Neck supple. No JVD present.  Cardiovascular: Normal rate and normal heart sounds. Exam reveals no gallop and no friction rub.  No murmur heard. Pulmonary/Chest: Effort normal and breath sounds normal. No respiratory distress. She has no wheezes. She has no rales.  Abdominal: Soft. Bowel sounds are normal. She exhibits no distension and no mass. There is tenderness in the right lower quadrant and left lower quadrant.  Lower abdominal pain on palpation.  No bleeding external hemorrhoids.  Musculoskeletal: Normal range of motion. She exhibits no edema or tenderness.  Lymphadenopathy:    She has no cervical adenopathy.  Neurological: She is alert and oriented to person, place, and time.  Skin: Skin is warm. No rash noted. She is not diaphoretic. No erythema. No pallor.  Psychiatric: Mood, affect and judgment normal.  Nursing note and vitals reviewed.    Admission on 08/08/2018, Discharged on 08/08/2018  Component Date Value Ref Range Status  . Sodium 08/08/2018 136  135 - 145 mmol/L Final  . Potassium 08/08/2018 3.3* 3.5 - 5.1 mmol/L Final  . Chloride 08/08/2018 99  98 - 111 mmol/L Final  . CO2 08/08/2018 28  22 - 32 mmol/L Final  . Glucose, Bld 08/08/2018 102* 70 - 99 mg/dL Final  . BUN 08/08/2018 12  6 - 20 mg/dL Final  . Creatinine,  Ser 08/08/2018 1.00  0.44 - 1.00 mg/dL Final  . Calcium 08/08/2018 9.5  8.9 - 10.3 mg/dL Final  . Total Protein 08/08/2018 7.3  6.5 - 8.1 g/dL Final  . Albumin 08/08/2018 4.3  3.5 - 5.0 g/dL Final  . AST 08/08/2018 19  15 - 41 U/L Final  . ALT 08/08/2018 9  0 - 44 U/L Final  . Alkaline Phosphatase 08/08/2018 79  38 - 126 U/L Final  . Total Bilirubin 08/08/2018 0.7  0.3 - 1.2 mg/dL Final  . GFR calc non Af Amer 08/08/2018 >60  >60 mL/min Final  . GFR calc Af Amer 08/08/2018 >60  >60 mL/min Final   Comment: (NOTE) The eGFR has been calculated using the CKD EPI equation. This calculation has not been validated in all clinical situations. eGFR's persistently <60 mL/min signify possible Chronic Kidney Disease.   Georgiann Hahn gap 08/08/2018 9  5 - 15 Final   Performed at [facility], [address] [place], [place] [address]  . WBC 08/08/2018 9.0  3.6 - 11.0 K/uL Final  . RBC 08/08/2018 4.21  3.80 - 5.20 MIL/uL Final  . Hemoglobin 08/08/2018 10.8* 12.0 - 16.0 g/dL Final  . HCT 08/08/2018 32.0* 35.0 - 47.0 % Final  . MCV 08/08/2018 75.9* 80.0 - 100.0 fL Final  . MCH 08/08/2018 25.7* 26.0 - 34.0 pg Final  . MCHC 08/08/2018 33.9  32.0 - 36.0 g/dL Final  . RDW 08/08/2018 24.7* 11.5 - 14.5 % Final  . Platelets 08/08/2018 398  150 - 440 K/uL Final   Performed at [facility], [address], [place], [place] [address]  . ABO/RH(D) 08/08/2018 B POS   Final  . Antibody Screen 08/08/2018 NEG   Final  . Sample Expiration 08/08/2018    Final  Value:08/11/2018 Performed at [facility], [address], [place], [place] [address]   . Hemoglobin 08/08/2018 9.6* 12.0 - 16.0 g/dL Final  . HCT 08/08/2018 28.2* 35.0 - 47.0 % Final   Performed at [facility], [address] [place], [place] [address]    Assessment:  LEODA SMITHHART is a 57 y.o. female with iron deficiency anemia secondary to an upper GI bleed.  She presented with intermittent  melena and chest pain.  Hemoglobin nadir was 6.9 on 06/14/2018.  Ferritin was 9, iron saturation 11% and TIBC 393 on 06/20/2018.  She received 1 unit of PRBCs.  Work-up on 07/05/2018 revealed a hematocrit of 31, hemoglobin 10.2, and MCV 70.8.  Ferritin was 7.  B12 was 165 (low).  Folate was 6.3 (> 5.9).  TSH was 1.256.  Ferritin has been followed: 9 on 06/20/2018, 7 on 07/05/2018 and 5 on 07/19/2018. She received Venofer 200 mg on 07/25/2018.  She has B12 deficiency.  She began oral B12 on 07/06/2018.  EGD on 06/14/2018 revealed esophageal mucosal changes c/w short segment of Barrett's and a non-bleeding gastric ulcers.  Pathology at the GE junction revealed squamocolumnar mucosa with moderate chronic inflammation.  Gastric biopsy revealed no H pylori, dysplasia, or malignancy.    Colonoscopy on 06/15/2018 revealed a 5 mm polyp in the sigmoid colon (tubular adenoma).  Repeat colonoscopy was discussed in 6-12 months due to fair prep.    Chest CT angiogram on 06/22/2018 reveled no pulmonary embolism.  There was underlying emphysematous changes.  There were small LUL pulmonary nodular changes (largest 3 mm).  There was no thoracic adenopathy.    She has not had a mammogram in several years until ordered by this office. BILATERAL mammogram on 07/27/2018 revealed 2 indeterminate masses in the RIGHT breast at the 2:30 and 3 o'clock positions. Follow up breast ultrasounds shows a 6 x 4 x 5 mm oval hypoechoic mass, 1 cm from the nipple, in the 2:30 position. In the 3 o'clock position, again 1 cm from the nipple, the was a 6 x 3 x 3 mm oval hypoechoic mass. LEFT breast ultrasound showed normal fibroglandular tissue at the 10 o'clock position. Ultrasound guided needle biopsy with clip placement on 08/03/2018. Pathology revealed revealed a dilated duct/cyst with sclerosis of the wall in the 2:30 lesion. Sample negative for atypia and malignancy. Lesion in the 3 o'clock position revealed an intraductal papilloma.  Sample negative for atypia and malignancy.  Symptomatically, she has a 2 day history of significant rectal bleeding.  She has significant lower abdominal pain.   Plan: 1. Labs today: B12, folate. 2. Right breast intraaductal papilloma  Review mammogram, BILATERAL breast ultrasounds, and subsequent RIGHT breast mass biopsy results.  6 x 4 x 5 mm oval hypoechoic mass, 1 cm from the nipple, in the 2:30 position of the RIGHT breast. Pathology showed dilated duct/cyst with sclerosis of the wall. No atypia or malignancy.   6 x 3 x 3 mm oval hypoechoic mass, 1 cm from the nipple, in the 3 o'clock position of the RIGHT breast. Pathology showed an intraductal papilloma, with no atypia and malignancy.  LEFT breast ultrasound showed normal fibroglandular tissue at the 10 o'clock position  Discuss surgical referral to Dr. Tama High for consideration of excision of intraductal papilloma. 3. Acute abdominal pain and rectal bleeding  Etiology unclear.  Possible ischemic colitis  Dr Bonna Gains contacted.  Transport to ER for evaluation, labs, and imaging.  4. Iron deficiency anemia  First intravenous  iron infusion received on 07/25/2018.  Postpone Venofer today secondary to acute rectal bleeding and abdominal pain.  Reschedule weekly Venofer infusions x 3.  5. B12 deficiency  Began oral B12 1,000 mcg on 07/06/2018.   Will check B12 and folate level today. If no improvement, anticipate switching to parenteral supplementation.  6. Pulmonary nodules  Anticipate follow up CT imaging on 06/23/2019.  7.   Patient transported to ER for evaluation of acute abdominal pain.    Lequita Asal, MD  08/08/18, 4:35 PM   I saw and evaluated the patient, participating in the key portions of the service and reviewing pertinent diagnostic studies and records.  I reviewed the nurse practitioner's note and agree with the findings and the plan.  The assessment and plan were discussed with the patient.  Multiple questions were asked by the patient and answered.   Nolon Stalls, MD 08/08/2018, 4:35 PM

## 2018-08-08 ENCOUNTER — Other Ambulatory Visit: Payer: Self-pay

## 2018-08-08 ENCOUNTER — Encounter: Payer: Self-pay | Admitting: Hematology and Oncology

## 2018-08-08 ENCOUNTER — Encounter: Payer: Self-pay | Admitting: Emergency Medicine

## 2018-08-08 ENCOUNTER — Inpatient Hospital Stay (HOSPITAL_BASED_OUTPATIENT_CLINIC_OR_DEPARTMENT_OTHER): Payer: Medicare HMO | Admitting: Hematology and Oncology

## 2018-08-08 ENCOUNTER — Emergency Department: Payer: Medicare HMO

## 2018-08-08 ENCOUNTER — Emergency Department
Admission: EM | Admit: 2018-08-08 | Discharge: 2018-08-08 | Disposition: A | Discharge status: Home / Self Care | Payer: Medicare HMO | Attending: Emergency Medicine | Admitting: Emergency Medicine

## 2018-08-08 ENCOUNTER — Inpatient Hospital Stay: Payer: Medicare HMO

## 2018-08-08 VITALS — BP 119/81 | HR 76 | Temp 96.7°F | Resp 18 | Wt 90.4 lb

## 2018-08-08 DIAGNOSIS — R634 Abnormal weight loss: Secondary | ICD-10-CM

## 2018-08-08 DIAGNOSIS — E538 Deficiency of other specified B group vitamins: Secondary | ICD-10-CM | POA: Diagnosis not present

## 2018-08-08 DIAGNOSIS — F17210 Nicotine dependence, cigarettes, uncomplicated: Secondary | ICD-10-CM | POA: Diagnosis not present

## 2018-08-08 DIAGNOSIS — D241 Benign neoplasm of right breast: Secondary | ICD-10-CM

## 2018-08-08 DIAGNOSIS — K922 Gastrointestinal hemorrhage, unspecified: Secondary | ICD-10-CM | POA: Insufficient documentation

## 2018-08-08 DIAGNOSIS — I119 Hypertensive heart disease without heart failure: Secondary | ICD-10-CM | POA: Diagnosis not present

## 2018-08-08 DIAGNOSIS — Z96611 Presence of right artificial shoulder joint: Secondary | ICD-10-CM | POA: Insufficient documentation

## 2018-08-08 DIAGNOSIS — Z8541 Personal history of malignant neoplasm of cervix uteri: Secondary | ICD-10-CM | POA: Diagnosis not present

## 2018-08-08 DIAGNOSIS — Z79899 Other long term (current) drug therapy: Secondary | ICD-10-CM | POA: Insufficient documentation

## 2018-08-08 DIAGNOSIS — R079 Chest pain, unspecified: Secondary | ICD-10-CM | POA: Diagnosis not present

## 2018-08-08 DIAGNOSIS — K529 Noninfective gastroenteritis and colitis, unspecified: Secondary | ICD-10-CM | POA: Diagnosis not present

## 2018-08-08 DIAGNOSIS — R918 Other nonspecific abnormal finding of lung field: Secondary | ICD-10-CM | POA: Diagnosis not present

## 2018-08-08 DIAGNOSIS — Z8249 Family history of ischemic heart disease and other diseases of the circulatory system: Secondary | ICD-10-CM

## 2018-08-08 DIAGNOSIS — D500 Iron deficiency anemia secondary to blood loss (chronic): Secondary | ICD-10-CM | POA: Diagnosis not present

## 2018-08-08 DIAGNOSIS — R1030 Lower abdominal pain, unspecified: Secondary | ICD-10-CM

## 2018-08-08 DIAGNOSIS — R51 Headache: Secondary | ICD-10-CM

## 2018-08-08 DIAGNOSIS — M545 Low back pain: Secondary | ICD-10-CM

## 2018-08-08 DIAGNOSIS — J449 Chronic obstructive pulmonary disease, unspecified: Secondary | ICD-10-CM | POA: Insufficient documentation

## 2018-08-08 DIAGNOSIS — K254 Chronic or unspecified gastric ulcer with hemorrhage: Secondary | ICD-10-CM | POA: Diagnosis not present

## 2018-08-08 DIAGNOSIS — R109 Unspecified abdominal pain: Secondary | ICD-10-CM | POA: Diagnosis not present

## 2018-08-08 DIAGNOSIS — Z96612 Presence of left artificial shoulder joint: Secondary | ICD-10-CM | POA: Insufficient documentation

## 2018-08-08 DIAGNOSIS — K625 Hemorrhage of anus and rectum: Secondary | ICD-10-CM

## 2018-08-08 HISTORY — DX: Benign neoplasm of right breast: D24.1

## 2018-08-08 LAB — CBC
HEMATOCRIT: 32 % — AB (ref 35.0–47.0)
HEMOGLOBIN: 10.8 g/dL — AB (ref 12.0–16.0)
MCH: 25.7 pg — AB (ref 26.0–34.0)
MCHC: 33.9 g/dL (ref 32.0–36.0)
MCV: 75.9 fL — AB (ref 80.0–100.0)
Platelets: 398 10*3/uL (ref 150–440)
RBC: 4.21 MIL/uL (ref 3.80–5.20)
RDW: 24.7 % — ABNORMAL HIGH (ref 11.5–14.5)
WBC: 9 10*3/uL (ref 3.6–11.0)

## 2018-08-08 LAB — COMPREHENSIVE METABOLIC PANEL
ALBUMIN: 4.3 g/dL (ref 3.5–5.0)
ALT: 9 U/L (ref 0–44)
AST: 19 U/L (ref 15–41)
Alkaline Phosphatase: 79 U/L (ref 38–126)
Anion gap: 9 (ref 5–15)
BUN: 12 mg/dL (ref 6–20)
CHLORIDE: 99 mmol/L (ref 98–111)
CO2: 28 mmol/L (ref 22–32)
Calcium: 9.5 mg/dL (ref 8.9–10.3)
Creatinine, Ser: 1 mg/dL (ref 0.44–1.00)
GFR calc Af Amer: >60 mL/min (ref >60)
GFR calc non Af Amer: >60 mL/min (ref >60)
Glucose, Bld: 102 mg/dL — ABNORMAL HIGH (ref 70–99)
POTASSIUM: 3.3 mmol/L — AB (ref 3.5–5.1)
SODIUM: 136 mmol/L (ref 135–145)
Total Bilirubin: 0.7 mg/dL (ref 0.3–1.2)
Total Protein: 7.3 g/dL (ref 6.5–8.1)

## 2018-08-08 LAB — HEMOGLOBIN AND HEMATOCRIT, BLOOD
HEMATOCRIT: 28.2 % — AB (ref 35.0–47.0)
Hemoglobin: 9.6 g/dL — ABNORMAL LOW (ref 12.0–16.0)

## 2018-08-08 LAB — TYPE AND SCREEN
ABO/RH(D): B POS
Antibody Screen: NEGATIVE

## 2018-08-08 MED ORDER — PIPERACILLIN-TAZOBACTAM 3.375 G IVPB 30 MIN
3.3750 g | Freq: Once | INTRAVENOUS | Status: AC
  Administered 2018-08-08: 3.375 g via INTRAVENOUS
  Filled 2018-08-08: qty 50

## 2018-08-08 MED ORDER — IOHEXOL 350 MG/ML SOLN
60.0000 mL | Freq: Once | INTRAVENOUS | Status: AC | PRN
  Administered 2018-08-08: 60 mL via INTRAVENOUS

## 2018-08-08 MED ORDER — ONDANSETRON HCL 4 MG/2ML IJ SOLN: 4.0000 mg | Freq: Once | INTRAMUSCULAR | Status: DC

## 2018-08-08 MED ORDER — METRONIDAZOLE 500 MG PO TABS: 500.0000 mg | ORAL_TABLET | Freq: Three times a day (TID) | ORAL | 0 refills | Status: AC

## 2018-08-08 MED ORDER — AMOXICILLIN-POT CLAVULANATE 875-125 MG PO TABS: 1.0000 | ORAL_TABLET | Freq: Two times a day (BID) | ORAL | 0 refills | Status: AC

## 2018-08-08 MED ORDER — ONDANSETRON 4 MG PO TBDP: 4.0000 mg | ORAL_TABLET | Freq: Three times a day (TID) | ORAL | 0 refills | Status: DC | PRN

## 2018-08-08 NOTE — Discharge Instructions (Addendum)
Please follow-up with your primary care doctor tomorrow morning or return to the emergency room if you continue to have episodes of rectal bleeding, dizziness, or any other symptoms that are concerning to you.  Please take the antibiotics fully as prescribed.

## 2018-08-08 NOTE — Progress Notes (Signed)
Patient states her bowel movements have been nothing but blood since late Saturday evening.  C/o pain in left abdomen. 5-10

## 2018-08-08 NOTE — ED Provider Notes (Signed)
[facility] Emergency Department Provider Note  ____________________________________________  Time seen: Approximately 7:31 PM  I have reviewed the triage vital signs and the nursing notes.   HISTORY  Chief Complaint Rectal Bleeding   HPI Molly Peters is a 57 y.o. female with history of anemia on iron infusion, cervical cancer status post hysterectomy, COPD, hypertension, IBS, peptic ulcer disease who presents for evaluation of rectal bleeding.  Patient reports 3 days of several daily episodes of rectal bleeding.  She reports no stool just blood per rectum.  She denies clots.  She is also complaining of diffuse lower abdominal pain which has been constant for 3 days, moderate, and is dull in intensity.  She has had nausea but no vomiting.  No chills, no fever, no dysuria.  She denies any prior history of rectal bleeding.  Patient has had a total hysterectomy but no other abdominal surgeries.  She denies NSAID use.  Past Medical History:  Diagnosis Date  . Anxiety   . Cervical cancer ([place])   . Chronic back pain 11/12/2009   Qualifier: Diagnosis of  By: Maxie Better FNP, Rosalita Levan   . COPD (chronic obstructive pulmonary disease) (HCC)    states SOB with ADLs; no O2 use; able to speak in complete sentences without SOB(11/15/2014)  . Depression   . Difficulty swallowing pills    s/p cervical fusion  . Family history of adverse reaction to anesthesia    pt's mother and sister have hx. of post-op N/V  . Fibromyalgia   . GERD (gastroesophageal reflux disease)   . History of gastric ulcer   . Hypertension    states under control with med., has been on med. x 1-2 yr.  . IBS (irritable bowel syndrome)   . Internal hemorrhoid    states has had intermittent bright red bleeding with BM (11/15/2014)  . Limited joint range of motion    neck - s/p cervical fusion  . Localized primary osteoarthritis of right shoulder region 11/23/2014  . Osteoarthritis of left  shoulder 07/05/2015  . Osteoarthritis of right shoulder 11/2014  . Seizures ([place])    last seizure 2010  . Short-term memory loss   . TMJ syndrome   . Valvular heart disease    Initial workup a number of years ago at [facility].  Echo (10/2009) showed EF 60-65%,      normal LV size, moderate aortic insufficiency with a trileaflet aortic valve and normal aortic root size.  Mild      mitral regurgitation.   . Wears dentures    lower    Patient Active Problem List   Diagnosis Date Noted  . Pulmonary nodules 08/08/2018  . Intraductal papilloma of breast, right 08/08/2018  . B12 deficiency 07/21/2018  . Weight loss 07/21/2018  . Iron deficiency anemia due to chronic blood loss 07/05/2018  . Microcytic anemia   . Polyp of sigmoid colon   . Columnar epithelial-lined lower esophagus   . Acute peptic ulcer of stomach   . Acute posthemorrhagic anemia   . GIB (gastrointestinal bleeding) 06/13/2018  . Hoarseness 10/27/2017  . Advance directive discussed with patient 10/27/2017  . Neuropathy 08/11/2016  . Vitamin D deficiency 03/15/2016  . 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 Hx of cervical spine surgery (Left ACDF) 02/02/2016  . Chronic pain 01/29/2016  . Marijuana use 01/29/2016  . Osteoarthritis of shoulder (Left) 07/05/2015  . S/P Bilateral Total Shoulder Replacement (2016) 11/23/2014  .  Osteoarthritis of shoulder (Right) 04/13/2014  . Routine general medical examination at a health care facility 04/05/2013  . CAROTID BRUIT, LEFT 01/02/2011  . Restless legs syndrome (RLS) 10/21/2010  . Essential hypertension, benign 06/23/2010  . HYPERLIPIDEMIA-MIXED 10/23/2009  . Valvular heart disease 03/27/2009  . IRRITABLE BOWEL SYNDROME 11/13/2008  . Mood disorder ([place]) 09/07/2007  . Tobacco use disorder 09/07/2007  . GLAUCOMA 09/07/2007  . HEMORRHOIDS 09/07/2007  . GASTRIC ULCER 09/07/2007  . COPD (chronic obstructive pulmonary disease) with chronic  bronchitis ([place]) 08/08/2007  . GERD 08/08/2007  . Seizure disorder ([place]) 08/08/2007    Past Surgical History:  Procedure Laterality Date  . ABDOMINAL HYSTERECTOMY     partial  . ANTERIOR CERVICAL DECOMP/DISCECTOMY FUSION  02/06/2011   C4-5, C5-6, C6-7  . BREAST BIOPSY Bilateral 10+ yrs ago   neg  . BREAST BIOPSY Right 08/03/2018   2 areas bx, path pending  . BREAST LUMPECTOMY Bilateral 1989   x 3 - benign  . CARDIAC CATHETERIZATION  1995  . CESAREAN SECTION     x 2  . COLONOSCOPY  09/28/2008  . COLONOSCOPY N/A 06/15/2018   Procedure: COLONOSCOPY;  Surgeon: Virgel Manifold, MD;  Location: [facility] ENDOSCOPY;  Service: Endoscopy;  Laterality: N/A;  . ESOPHAGOGASTRODUODENOSCOPY  09/28/2008  . ESOPHAGOGASTRODUODENOSCOPY N/A 06/14/2018   Procedure: ESOPHAGOGASTRODUODENOSCOPY (EGD);  Surgeon: Virgel Manifold, MD;  Location: [facility] ENDOSCOPY;  Service: Endoscopy;  Laterality: N/A;  . HARDWARE REMOVAL  11/17/2006   L5-S1  . LAMINECTOMY WITH POSTERIOR LATERAL ARTHRODESIS LEVEL 3  12/06/2009   with synovial cyst resection L3-4 bilat.  Marland Kitchen LAMINECTOMY WITH POSTERIOR LATERAL ARTHRODESIS LEVEL 3  11/17/2006   L4-5  . NM MYOVIEW LTD     Lexiscan myoview (10/2009): EF 77%, normal wall motion, normal perfusion.   Marland Kitchen SHOULDER ARTHROSCOPY WITH DEBRIDEMENT AND BICEP TENDON REPAIR Right 04/13/2014   Procedure: RIGHT SHOULDER ARTHROSCOPY WITH DEBRIDEMENT EXTENSIVE;  Surgeon: Johnny Bridge, MD;  Location: [place];  Service: Orthopedics;  Laterality: Right;  . TOTAL SHOULDER ARTHROPLASTY Right 11/23/2014   Procedure: TOTAL RIGHT SHOULDER ARTHROPLASTY;  Surgeon: Johnny Bridge, MD;  Location: [place];  Service: Orthopedics;  Laterality: Right;  . TOTAL SHOULDER ARTHROPLASTY Left 07/05/2015   Procedure: LEFT TOTAL SHOULDER REPLACEMENT;  Surgeon: Marchia Bond, MD;  Location: [place];  Service: Orthopedics;  Laterality: Left;    Prior to Admission  medications   Medication Sig Start Date End Date Taking? Authorizing Provider  ALPRAZolam (XANAX) 1 MG tablet TAKE 1 TABLET THREE TIMES DAILY AS NEEDED FOR ANXIETY Patient taking differently: Take 1 mg by mouth 3 (three) times daily as needed for anxiety.  05/02/18  Yes Venia Carbon, MD  gabapentin (NEURONTIN) 300 MG capsule Take 4 capsules (1,200 mg total) by mouth at bedtime. 06/15/18  Yes Mody, Ulice Bold, MD  lamoTRIgine (LAMICTAL) 100 MG tablet TAKE 1 TABLET TWICE DAILY 06/06/18  Yes Venia Carbon, MD  losartan-hydrochlorothiazide (HYZAAR) 100-12.5 MG tablet TAKE 1 TABLET EVERY DAY Patient taking differently: Take 1 tablet by mouth daily.  05/02/18  Yes Venia Carbon, MD  pantoprazole (PROTONIX) 40 MG tablet Take 1 tablet (40 mg total) by mouth 2 (two) times daily. 06/28/18 09/26/18 Yes [name] B, MD  PARoxetine (PAXIL) 20 MG tablet TAKE 1 TABLET EVERY MORNING 09/08/17  Yes Venia Carbon, MD  amoxicillin-clavulanate (AUGMENTIN) 875-125 MG tablet Take 1 tablet by mouth 2 (two) times daily for 7 days. 08/08/18 08/15/18  Alfred Levins, [place], MD  metroNIDAZOLE (FLAGYL) 500 MG tablet Take 1 tablet (500 mg total) by mouth 3 (three) times daily for 7 days. 08/08/18 08/15/18  Rudene Re, MD  nicotine (NICODERM CQ - DOSED IN MG/24 HOURS) 14 mg/24hr patch Place 1 patch (14 mg total) onto the skin daily. Patient not taking: Reported on 07/21/2018 06/15/18   Bettey Costa, MD  ondansetron (ZOFRAN ODT) 4 MG disintegrating tablet Take 1 tablet (4 mg total) by mouth every 8 (eight) hours as needed for nausea or vomiting. 08/08/18   Rudene Re, MD    Allergies Carbamazepine; Divalproex sodium; Clonazepam; Divalproex sodium; Diphenhydramine hcl; Diphenhydramine hcl; Hydrocodone-acetaminophen; Tiotropium; Tiotropium bromide monohydrate; and Vicodin [hydrocodone-acetaminophen]  Family History  Problem Relation Age of Onset  . Cervical cancer Mother   . Anesthesia problems Mother         post-op N/V  . Rheum arthritis Mother   . Anxiety disorder Mother   . Cancer Father        colon cancer  . Migraines Sister   . Cervical cancer Sister   . Anesthesia problems Sister        post-op N/V  . Migraines Brother   . Asthma Daughter   . Cerebral palsy Daughter        age 44  . Coronary artery disease Maternal Grandmother   . Hypertension Maternal Grandmother   . Diabetes Maternal Grandmother   . Coronary artery disease Paternal Grandmother   . Hypertension Paternal Grandmother   . Diabetes Paternal Grandmother   . Breast cancer Maternal Aunt   . Breast cancer Maternal Aunt   . Breast cancer Maternal Aunt     Social History Social History   Tobacco Use  . Smoking status: Current Every Day Smoker    Packs/day: 1.50    Years: 42.00    Pack years: 63.00    Types: Cigarettes  . Smokeless tobacco: Never Used  Substance Use Topics  . Alcohol use: No    Alcohol/week: 0.0 standard drinks    Comment: occasionally  . Drug use: Yes    Types: Marijuana    Comment: per pt she smoke thc at bedtime and prn  ( for pain relief and to help sleep since not given percocet anymore )-per pt     Review of Systems  Constitutional: Negative for fever. Eyes: Negative for visual changes. ENT: Negative for sore throat. Neck: No neck pain  Cardiovascular: Negative for chest pain. Respiratory: Negative for shortness of breath. Gastrointestinal: + abdominal pain, rectal bleeding. No vomiting  Genitourinary: Negative for dysuria. Musculoskeletal: Negative for back pain. Skin: Negative for rash. Neurological: Negative for headaches, weakness or numbness. Psych: No SI or HI  ____________________________________________   PHYSICAL EXAM:  VITAL SIGNS: ED Triage Vitals  Enc Vitals Group     BP 08/08/18 1510 125/86     Pulse Rate 08/08/18 1510 69     Resp 08/08/18 1510 18     Temp 08/08/18 1510 98 F (36.7 C)     Temp Source 08/08/18 1510 Oral     SpO2 08/08/18 1510 99 %      Weight 08/08/18 1512 90 lb (40.8 kg)     Height 08/08/18 1512 5' (1.524 m)     Head Circumference --      Peak Flow --      Pain Score 08/08/18 1512 5     Pain Loc --      Pain Edu? --      Excl. in  GC? --     Constitutional: Alert and oriented. Well appearing and in no apparent distress. HEENT:      Head: Normocephalic and atraumatic.         Eyes: Conjunctivae are normal. Sclera is non-icteric.       Mouth/Throat: Mucous membranes are moist.       Neck: Supple with no signs of meningismus. Cardiovascular: Regular rate and rhythm. No murmurs, gallops, or rubs. 2+ symmetrical distal pulses are present in all extremities. No JVD. Respiratory: Normal respiratory effort. Lungs are clear to auscultation bilaterally. No wheezes, crackles, or rhonchi.  Gastrointestinal: Soft, non tender, and non distended with positive bowel sounds. No rebound or guarding. Genitourinary: No CVA tenderness.  Rectal exam showing no evidence of stool but guaiac positive Musculoskeletal: Nontender with normal range of motion in all extremities. No edema, cyanosis, or erythema of extremities. Neurologic: Normal speech and language. Face is symmetric. Moving all extremities. No gross focal neurologic deficits are appreciated. Skin: Skin is warm, dry and intact. No rash noted. Psychiatric: Mood and affect are normal. Speech and behavior are normal.  ____________________________________________   LABS (all labs ordered are listed, but only abnormal results are displayed)  Labs Reviewed  COMPREHENSIVE METABOLIC PANEL - Abnormal; Notable for the following components:      Result Value   Potassium 3.3 (*)    Glucose, Bld 102 (*)    All other components within normal limits  CBC - Abnormal; Notable for the following components:   Hemoglobin 10.8 (*)    HCT 32.0 (*)    MCV 75.9 (*)    MCH 25.7 (*)    RDW 24.7 (*)    All other components within normal limits  HEMOGLOBIN AND HEMATOCRIT, BLOOD - Abnormal;  Notable for the following components:   Hemoglobin 9.6 (*)    HCT 28.2 (*)    All other components within normal limits  POC OCCULT BLOOD, ED  TYPE AND SCREEN   ____________________________________________  EKG  none  ____________________________________________  RADIOLOGY  I have personally reviewed the images performed during this visit and I agree with the Radiologist's read.   Interpretation by Radiologist:  Ct Angio Abd/pel W And/or Wo Contrast  Result Date: 08/08/2018 CLINICAL DATA:  Rectal bleeding x3 days, abdominal pain, nausea EXAM: CTA ABDOMEN AND PELVIS WITHOUT AND WITH CONTRAST TECHNIQUE: Multidetector CT imaging of the abdomen and pelvis was performed using the standard protocol during bolus administration of intravenous contrast. Multiplanar reconstructed images and MIPs were obtained and reviewed to evaluate the vascular anatomy. CONTRAST:  52mL OMNIPAQUE IOHEXOL 350 MG/ML SOLN COMPARISON:  CT abdomen/pelvis dated 06/26/2011 FINDINGS: VASCULAR Aorta: No evidence abdominal aortic aneurysm or dissection. Atherosclerotic calcifications of the abdomen aorta. Celiac: Patent. SMA: Patent. Renals: Patent bilaterally. Mildly narrowed on the right (series 4/image 48). Atherosclerotic calcifications proximally on the left (series 4/image 52). IMA: Patent. Inflow: Patent.  Atherosclerotic calcifications bilaterally Proximal Outflow: Patent.  Atherosclerotic calcifications. Veins: Unremarkable. No findings to suggest active GI bleeding on CT. Review of the MIP images confirms the above findings. NON-VASCULAR Lower chest: Lung bases are clear. Hepatobiliary: Liver is within normal limits. Gallbladder is unremarkable. No intrahepatic or extrahepatic ductal dilatation. Pancreas: Within normal limits. Spleen: Within normal limits. Adrenals/Urinary Tract: Adrenal glands within normal limits. Kidneys are within normal limits.  No hydronephrosis. Bladder is mildly thick-walled. Stomach/Bowel:  Stomach is within normal limits. No evidence of bowel obstruction. Normal appendix (series 6/image 96). Wall thickening involving the transverse colon (series 6/image 132) extending  to the splenic flexure (series 6/image 51), suspicious for infectious/inflammatory colitis. No pneumatosis or free air. Lymphatic: No suspicious abdominopelvic lymphadenopathy. Reproductive: Status post hysterectomy. Left ovary is within normal limits.  No right adnexal mass Other: Trace pelvic ascites. Musculoskeletal: Degenerative changes of the visualized thoracolumbar spine. Posterior lumbar fixation hardware at L3-5. Right cage fusion at L5-S1. Upper lumbar levoscoliosis. IMPRESSION: VASCULAR No evidence of abdominal aortic aneurysm or dissection. No findings to suggest active GI bleeding on CT. NON-VASCULAR Wall thickening involving the transverse colon extending to the splenic flexure, suspicious for infectious/inflammatory colitis. Associated trace pelvic ascites. No pneumatosis or free air. Electronically Signed   By: Julian Hy M.D.   On: 08/08/2018 19:09     ____________________________________________   PROCEDURES  Procedure(s) performed: None Procedures Critical Care performed:  None ____________________________________________   INITIAL IMPRESSION / ASSESSMENT AND PLAN / ED COURSE  57 y.o. female with history of anemia on iron infusion, cervical cancer status post hysterectomy, COPD, hypertension, IBS, peptic ulcer disease who presents for evaluation of rectal bleeding and abdominal pain x 3 days.  Patient is hemodynamically stable, rectal exam showing no evidence of stool or blood but guaiac paper was positive.  Hemoglobin stable at 10.8.  CT angiogram was done which shows no evidence of ischemia but does show colitis of the transverse colon extending to the splenic flexure. Will give zosyn and repeat hgb, if stable plan to dc on flagyl and augmentin.  Clinical Course as of Aug 08 2201  Mon Aug 08, 2018  2159 Repeat hemoglobin dropped from 10.8-9.6 over an interval of 4.5 hours.  Recommended admission to the hospital.  Patient refused and would like to go home.  She has not had any more rectal bleeding since being placed in the room several hours ago.  I discussed with her that she can continue to have bleeding inside her colon and not necessarily have any rectal bleeding continue to drop her hemoglobin to the point that she will need a blood transfusion.  She reports that she will follow-up with her doctor first thing tomorrow morning or return to the emergency room if the bleeding resumes or if she develops any signs and symptoms of acute blood loss anemia.  Patient does understand the dangers of going home with her hemoglobin dropping including hemorrhagic shock and death but she continues to be persistent about going home.  She is not going to be alone and has her husband with her.    [CV]    Clinical Course User Index [CV] Alfred Levins [place], MD     As part of my medical decision making, I reviewed the following data within the [place] notes reviewed and incorporated, Labs reviewed , Old chart reviewed, Radiograph reviewed , Notes from prior ED visits and [place] Controlled Substance Database    Pertinent labs & imaging results that were available during my care of the patient were reviewed by me and considered in my medical decision making (see chart for details).    ____________________________________________   FINAL CLINICAL IMPRESSION(S) / ED DIAGNOSES  Final diagnoses:  Lower GI bleed  Colitis      NEW MEDICATIONS STARTED DURING THIS VISIT:  ED Discharge Orders         Ordered    metroNIDAZOLE (FLAGYL) 500 MG tablet  3 times daily     08/08/18 2201    amoxicillin-clavulanate (AUGMENTIN) 875-125 MG tablet  2 times daily     08/08/18 2201  ondansetron (ZOFRAN ODT) 4 MG disintegrating tablet  Every 8 hours PRN     08/08/18 2201             Note:  This document was prepared using Dragon voice recognition software and may include unintentional dictation errors.    Rudene Re, MD 08/08/18 2202

## 2018-08-08 NOTE — ED Triage Notes (Signed)
Rectal bleeding x 3 days. Abdominal pain nausea, without vomiting.

## 2018-08-10 ENCOUNTER — Encounter: Payer: Self-pay | Admitting: Gastroenterology

## 2018-08-10 ENCOUNTER — Ambulatory Visit: Payer: Medicare HMO | Admitting: Gastroenterology

## 2018-08-10 VITALS — BP 123/69 | HR 67 | Ht 61.0 in | Wt 92.2 lb

## 2018-08-10 DIAGNOSIS — A09 Infectious gastroenteritis and colitis, unspecified: Secondary | ICD-10-CM

## 2018-08-10 DIAGNOSIS — D508 Other iron deficiency anemias: Secondary | ICD-10-CM

## 2018-08-10 DIAGNOSIS — K921 Melena: Secondary | ICD-10-CM

## 2018-08-10 MED ORDER — IRON SUCROSE 20 MG/ML IV SOLN
INTRAVENOUS | Status: AC
  Filled 2018-08-10: qty 10

## 2018-08-10 NOTE — Progress Notes (Signed)
Molly Antigua, MD [address]  [place]  [place], [place] [address]  Main: [phone]  Fax: [phone]   Primary Care Physician: Venia Carbon, MD  Primary Gastroenterologist:  Dr. Vonda Peters  Chief Complaint  Patient presents with  . Follow-up    ED f/u rectal bleeding    HPI: Molly Peters is a 57 y.o. female with history of gastric ulcers diagnosed in August 2019 during a hospital admission for melena and anemia, presents due to hematochezia at home that started 5 days ago and has resolved over the last 1 to 2 days.  [place] and patient reports getting nauseated after eating lunch, and started having bloody output from rectum.  No melena.  No vomiting.  The nausea improved after 1 day, but hematochezia continued, and when she saw her hematologist for IV iron transfusion 2 days ago, they directed her to go to the ER due to ongoing hematochezia.  CT abdomen at the ER visit showed wall thickening at the transverse colon extending to the splenic flexure.  Hemoglobin was 10.8 to 9.6 on the day of the ER visit on August 08, 2018.  Baseline was 9.9 on July 19, 2018.  Patient receives IV iron transfusions through hematology.  Patient has not had any further hematochezia for the last 24 hours.  She had a solid brown bowel movement this morning.  No fever or chills.  Nausea much improved.  Reports mild abdominal discomfort.  No loss of appetite.  Current Outpatient Medications  Medication Sig Dispense Refill  . ALPRAZolam (XANAX) 1 MG tablet TAKE 1 TABLET THREE TIMES DAILY AS NEEDED FOR ANXIETY (Patient taking differently: Take 1 mg by mouth 3 (three) times daily as needed for anxiety. ) 270 tablet 0  . amoxicillin-clavulanate (AUGMENTIN) 875-125 MG tablet Take 1 tablet by mouth 2 (two) times daily for 7 days. 14 tablet 0  . gabapentin (NEURONTIN) 300 MG capsule Take 4 capsules (1,200 mg total) by mouth at  bedtime.    . lamoTRIgine (LAMICTAL) 100 MG tablet TAKE 1 TABLET TWICE DAILY 180 tablet 1  . losartan-hydrochlorothiazide (HYZAAR) 100-12.5 MG tablet TAKE 1 TABLET EVERY DAY (Patient taking differently: Take 1 tablet by mouth daily. ) 90 tablet 1  . metroNIDAZOLE (FLAGYL) 500 MG tablet Take 1 tablet (500 mg total) by mouth 3 (three) times daily for 7 days. 21 tablet 0  . nicotine (NICODERM CQ - DOSED IN MG/24 HOURS) 14 mg/24hr patch Place 1 patch (14 mg total) onto the skin daily. 28 patch 0  . ondansetron (ZOFRAN ODT) 4 MG disintegrating tablet Take 1 tablet (4 mg total) by mouth every 8 (eight) hours as needed for nausea or vomiting. 20 tablet 0  . pantoprazole (PROTONIX) 40 MG tablet Take 1 tablet (40 mg total) by mouth 2 (two) times daily. 180 tablet 0  . PARoxetine (PAXIL) 20 MG tablet TAKE 1 TABLET EVERY MORNING 90 tablet 3   No current facility-administered medications for this visit.     Allergies as of 08/10/2018 - Review Complete 08/10/2018  Allergen Reaction Noted  . Carbamazepine Hives 08/26/2015  . Divalproex sodium Swelling 01/01/2016  . Clonazepam Other (See Comments) 08/12/2005  . Divalproex sodium Swelling 08/12/2005  . Diphenhydramine hcl Other (See Comments) 08/12/2005  . Diphenhydramine hcl Rash and Other (See Comments) 08/26/2015  . Hydrocodone-acetaminophen Itching 08/26/2015  . Tiotropium Other (See Comments) 01/01/2016  . Tiotropium bromide monohydrate Other (See Comments)   .  Vicodin [hydrocodone-acetaminophen] Itching 11/15/2014    ROS:  General: Negative for anorexia, weight loss, fever, chills, fatigue, weakness. ENT: Negative for hoarseness, difficulty swallowing , nasal congestion. CV: Negative for chest pain, angina, palpitations, dyspnea on exertion, peripheral edema.  Respiratory: Negative for dyspnea at rest, dyspnea on exertion, cough, sputum, wheezing.  GI: See history of present illness. GU:  Negative for dysuria, hematuria, urinary incontinence,  urinary frequency, nocturnal urination.  Endo: Negative for unusual weight change.    Physical Examination:   BP 123/69   Pulse 67   Ht _0  (1.549 m)   Wt 92 lb 3.2 oz (41.8 kg)   BMI 17.42 kg/m   General: Well-nourished, well-developed in no acute distress.  Eyes: No icterus. Conjunctivae pink. Mouth: Oropharyngeal mucosa moist and pink , no lesions erythema or exudate. Neck: Supple, Trachea midline Abdomen: Bowel sounds are normal, nontender, nondistended, no hepatosplenomegaly or masses, no abdominal bruits or hernia , no rebound or guarding.   Extremities: No lower extremity edema. No clubbing or deformities. Neuro: Alert and oriented x 3.  Grossly intact. Skin: Warm and dry, no jaundice.   Psych: Alert and cooperative, normal mood and affect.   Labs: CMP     Component Value Date/Time   NA 136 08/08/2018 1514   K 3.3 (L) 08/08/2018 1514   CL 99 08/08/2018 1514   CO2 28 08/08/2018 1514   GLUCOSE 102 (H) 08/08/2018 1514   BUN 12 08/08/2018 1514   CREATININE 1.00 08/08/2018 1514   CALCIUM 9.5 08/08/2018 1514   PROT 7.3 08/08/2018 1514   ALBUMIN 4.3 08/08/2018 1514   AST 19 08/08/2018 1514   ALT 9 08/08/2018 1514   ALKPHOS 79 08/08/2018 1514   BILITOT 0.7 08/08/2018 1514   GFRNONAA >60 08/08/2018 1514   GFRAA >60 08/08/2018 1514   Lab Results  Component Value Date   WBC 9.0 08/08/2018   HGB 9.6 (L) 08/08/2018   HCT 28.2 (L) 08/08/2018   MCV 75.9 (L) 08/08/2018   PLT 398 08/08/2018    Imaging Studies: US Breast Ltd Uni Left Inc Axilla  Result Date: 07/27/2018 CLINICAL DATA:  57 year old female presenting for evaluation of palpable masses in both breasts identified on self breast exam. These lumps are in the left breast at 11 o'clock and the right breast at 2 o'clock and 11 o'clock. EXAM: DIGITAL DIAGNOSTIC BILATERAL MAMMOGRAM WITH CAD AND TOMO ULTRASOUND BILATERAL BREAST COMPARISON:  Previous exam(s). ACR Breast Density Category c: The breast tissue is  heterogeneously dense, which may obscure small masses. FINDINGS: A BB has been placed on the upper-outer quadrant of the right breast, posterior depth and the medial aspect of the right breast near the nipple. No suspicious mammographic findings are identified deep to either of these markers. Another palpable marker has been placed on the upper inner quadrant of the left breast. No suspicious mammographic findings are identified deep to this marker. No suspicious calcifications, masses or areas of distortion are seen in the bilateral breasts. Mammographic images were processed with CAD. On physical exam, no palpable masses are identified in the upper-outer quadrant of the right breast at the site identified by the patient. There is a small mobile mass palpated in the 3 o'clock position just medial to the right nipple. No palpable masses are identified in the upper inner left breast. Ultrasound of the right breast at 3 o'clock 1 cm from the nipple at the palpable site of concern demonstrates an oval hypoechoic mass with somewhat indistinct margins measuring  6 x 3 x 3 mm. At 2:30, 1 cm from the nipple there is a second oval hypoechoic mass whose borders are more circumscribed which measures 6 x 4 x 5 mm. These 2 masses are approximately 2.8 cm apart. (The 1 image demonstrating the 2 masses together in the oblique plane shows the 230 lesion mislabeled as 2 o'clock). Only 2 masses are identified in the right breast. Ultrasound of the left breast at 10 o'clock demonstrates normal fibroglandular tissue. No masses or suspicious areas of shadowing are identified. IMPRESSION: 1. There are 2 indeterminate masses in the right breast at 2:30 and at 3 o'clock. 2. There are no mammographic or targeted sonographic abnormalities in the upper-outer right breast or upper inner left breast to explain the palpable sites identified by the patient. 3.  No mammographic evidence of left breast malignancy. RECOMMENDATION: 1. Ultrasound  guided biopsy is recommended for the 2 masses in the right breast. 2. Clinical follow-up recommended for the palpable areas of concern in the upper-outer right breast and upper inner left breast. Any further workup should be based on clinical grounds. I have discussed the findings and recommendations with the patient. Results were also provided in writing at the conclusion of the visit. If applicable, a reminder letter will be sent to the patient regarding the next appointment. BI-RADS CATEGORY  4: Suspicious. Electronically Signed   By: Ammie Ferrier M.D.   On: 07/27/2018 13:11   US Breast Ltd Uni Right Inc Axilla  Result Date: 07/27/2018 CLINICAL DATA:  57 year old female presenting for evaluation of palpable masses in both breasts identified on self breast exam. These lumps are in the left breast at 11 o'clock and the right breast at 2 o'clock and 11 o'clock. EXAM: DIGITAL DIAGNOSTIC BILATERAL MAMMOGRAM WITH CAD AND TOMO ULTRASOUND BILATERAL BREAST COMPARISON:  Previous exam(s). ACR Breast Density Category c: The breast tissue is heterogeneously dense, which may obscure small masses. FINDINGS: A BB has been placed on the upper-outer quadrant of the right breast, posterior depth and the medial aspect of the right breast near the nipple. No suspicious mammographic findings are identified deep to either of these markers. Another palpable marker has been placed on the upper inner quadrant of the left breast. No suspicious mammographic findings are identified deep to this marker. No suspicious calcifications, masses or areas of distortion are seen in the bilateral breasts. Mammographic images were processed with CAD. On physical exam, no palpable masses are identified in the upper-outer quadrant of the right breast at the site identified by the patient. There is a small mobile mass palpated in the 3 o'clock position just medial to the right nipple. No palpable masses are identified in the upper inner left  breast. Ultrasound of the right breast at 3 o'clock 1 cm from the nipple at the palpable site of concern demonstrates an oval hypoechoic mass with somewhat indistinct margins measuring 6 x 3 x 3 mm. At 2:30, 1 cm from the nipple there is a second oval hypoechoic mass whose borders are more circumscribed which measures 6 x 4 x 5 mm. These 2 masses are approximately 2.8 cm apart. (The 1 image demonstrating the 2 masses together in the oblique plane shows the 230 lesion mislabeled as 2 o'clock). Only 2 masses are identified in the right breast. Ultrasound of the left breast at 10 o'clock demonstrates normal fibroglandular tissue. No masses or suspicious areas of shadowing are identified. IMPRESSION: 1. There are 2 indeterminate masses in the right breast at 2:30  and at 3 o'clock. 2. There are no mammographic or targeted sonographic abnormalities in the upper-outer right breast or upper inner left breast to explain the palpable sites identified by the patient. 3.  No mammographic evidence of left breast malignancy. RECOMMENDATION: 1. Ultrasound guided biopsy is recommended for the 2 masses in the right breast. 2. Clinical follow-up recommended for the palpable areas of concern in the upper-outer right breast and upper inner left breast. Any further workup should be based on clinical grounds. I have discussed the findings and recommendations with the patient. Results were also provided in writing at the conclusion of the visit. If applicable, a reminder letter will be sent to the patient regarding the next appointment. BI-RADS CATEGORY  4: Suspicious. Electronically Signed   By: Ammie Ferrier M.D.   On: 07/27/2018 13:11   Mm Diag Breast Tomo Bilateral  Result Date: 07/27/2018 CLINICAL DATA:  57 year old female presenting for evaluation of palpable masses in both breasts identified on self breast exam. These lumps are in the left breast at 11 o'clock and the right breast at 2 o'clock and 11 o'clock. EXAM: DIGITAL  DIAGNOSTIC BILATERAL MAMMOGRAM WITH CAD AND TOMO ULTRASOUND BILATERAL BREAST COMPARISON:  Previous exam(s). ACR Breast Density Category c: The breast tissue is heterogeneously dense, which may obscure small masses. FINDINGS: A BB has been placed on the upper-outer quadrant of the right breast, posterior depth and the medial aspect of the right breast near the nipple. No suspicious mammographic findings are identified deep to either of these markers. Another palpable marker has been placed on the upper inner quadrant of the left breast. No suspicious mammographic findings are identified deep to this marker. No suspicious calcifications, masses or areas of distortion are seen in the bilateral breasts. Mammographic images were processed with CAD. On physical exam, no palpable masses are identified in the upper-outer quadrant of the right breast at the site identified by the patient. There is a small mobile mass palpated in the 3 o'clock position just medial to the right nipple. No palpable masses are identified in the upper inner left breast. Ultrasound of the right breast at 3 o'clock 1 cm from the nipple at the palpable site of concern demonstrates an oval hypoechoic mass with somewhat indistinct margins measuring 6 x 3 x 3 mm. At 2:30, 1 cm from the nipple there is a second oval hypoechoic mass whose borders are more circumscribed which measures 6 x 4 x 5 mm. These 2 masses are approximately 2.8 cm apart. (The 1 image demonstrating the 2 masses together in the oblique plane shows the 230 lesion mislabeled as 2 o'clock). Only 2 masses are identified in the right breast. Ultrasound of the left breast at 10 o'clock demonstrates normal fibroglandular tissue. No masses or suspicious areas of shadowing are identified. IMPRESSION: 1. There are 2 indeterminate masses in the right breast at 2:30 and at 3 o'clock. 2. There are no mammographic or targeted sonographic abnormalities in the upper-outer right breast or upper inner  left breast to explain the palpable sites identified by the patient. 3.  No mammographic evidence of left breast malignancy. RECOMMENDATION: 1. Ultrasound guided biopsy is recommended for the 2 masses in the right breast. 2. Clinical follow-up recommended for the palpable areas of concern in the upper-outer right breast and upper inner left breast. Any further workup should be based on clinical grounds. I have discussed the findings and recommendations with the patient. Results were also provided in writing at the conclusion of the  visit. If applicable, a reminder letter will be sent to the patient regarding the next appointment. BI-RADS CATEGORY  4: Suspicious. Electronically Signed   By: Ammie Ferrier M.D.   On: 07/27/2018 13:11   Mm Clip Placement Right  Result Date: 08/03/2018 CLINICAL DATA:  Patient is post ultrasound-guided core needle biopsy of 2 subcentimeter right breast masses at the 2:30 position and 3 o'clock position of the periareolar region. EXAM: DIAGNOSTIC RIGHT MAMMOGRAM POST ULTRASOUND BIOPSY COMPARISON:  Previous exam(s). FINDINGS: Mammographic images were obtained following ultrasound guided biopsy of 2 masses over the 2:30 position and 3 o'clock position of the right periareolar region. Images demonstrates satisfactory placement of a heart shaped metallic clip over the biopsied mass at the 2:30 position and satisfactory placement of a coil shaped metallic clip over the biopsied mass at the 3 o'clock position. IMPRESSION: Satisfactory clip placement post ultrasound-guided core needle biopsy 2 right breast masses. Final Assessment: Post Procedure Mammograms for Marker Placement Electronically Signed   By: Marin Olp M.D.   On: 08/03/2018 09:05   [place] Rt Breast Bx W Loc Dev 1st Lesion Img Bx Spec US Guide  Addendum Date: 08/08/2018   ADDENDUM REPORT: 08/05/2018 17:04 ADDENDUM: Pathology of the right breast biopsy, 2:30 o'clock position revealed A. BREAST, RIGHT, 2:30 1 CM FROM NIPPLE;  ULTRASOUND-GUIDED CORE BIOPSY: DILATED DUCT/CYST, 6 MM MEASURED ON SLIDE, WITH SCLEROSIS OF THE WALL. NEGATIVE FOR EPITHELIAL PROLIFERATIVE CHANGES, ATYPIA, AND MALIGNANCY. Pathology of the right breast biopsy, 3:00 o'clock position revealed B. BREAST, RIGHT, 3:00 1 CM FROM NIPPLE; ULTRASOUND-GUIDED CORE BIOPSY: INTRADUCTAL PAPILLOMA. NEGATIVE FOR ATYPIA AND MALIGNANCY. Both biopsies found to be concordant by Dr. Derrel Nip. 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 Results and recommendations were relayed to Al Pimple, RN, nurse navigator for [facility] by phone by Jetta Lout, [place]. She will contact Dr. Mike Gip and Honor Loh, NP with results and recommendations. The patient has an appointment at the [facility] on Monday, 08/08/18. Results and recommendations were relayed to the patient by phone by Jetta Lout, [place] on 08/05/18. The patient stated she did well following the biopsy with a small bruise but no bleeding or pain. Post biopsy instructions were reviewed with the patient and all of her questions were answered. She stated that she has an appointment to see Dr. Mike Gip on Monday, 08/08/18 at 1:00 PM and will discuss surgical referral with Dr. Mike Gip at that time. She was encouraged to contact the [facility] with any further questions or concerns. Addendum by Jetta Lout, RRA on 08/05/18. Electronically Signed   By: Marin Olp M.D.   On: 08/05/2018 17:04   Result Date: 08/08/2018 CLINICAL DATA:  Patient presents for ultrasound-guided core needle biopsy of 2 right breast masses located at the 2:30 position and 3 o'clock position of the periareolar region. EXAM: ULTRASOUND GUIDED RIGHT BREAST CORE NEEDLE BIOPSY COMPARISON:  Previous exam(s). FINDINGS: I met with the patient and we discussed the procedure of ultrasound-guided biopsy, including benefits and alternatives. We discussed the high likelihood of a successful  procedure. We discussed the risks of the procedure, including infection, bleeding, tissue injury, clip migration, and inadequate sampling. Informed written consent was given. The usual time-out protocol was performed immediately prior to the procedure. Lesion quadrant: Right upper inner quadrant Using sterile technique and 1% Lidocaine as local anesthetic, under direct ultrasound visualization, a 14 gauge spring-loaded device was used to perform biopsy of the targeted mass at the 2:30 position using  a inferior to superior approach. Two specimens were obtained as the lesion then became too difficult to visualize to allow obtaining additional specimens. At the conclusion of the procedure a heart shaped tissue marker clip was deployed into the biopsy cavity. Follow up 2 view mammogram was performed and dictated separately. Lesion quadrant: Right upper inner quadrant at the 2:30-3 o'clock position Using sterile technique and 1% Lidocaine as local anesthetic, under direct ultrasound visualization, a 14 gauge spring-loaded device was used to perform biopsy of the targeted mass at the 3 o'clock position using a inferior to superior approach. Three tissue specimens were obtained. Mass became difficult to visualize after the second biopsy pass. At the conclusion of the procedure a coil shaped tissue marker clip was deployed into the biopsy cavity. Follow up 2 view mammogram was performed and dictated separately. IMPRESSION: Ultrasound guided biopsy of 2 subcentimeter right breast masses over the 2:30 position and 3 o'clock position of the periareolar region. No apparent complications. Electronically Signed: By: Marin Olp M.D. On: 08/03/2018 09:08   [place] Rt Breast Bx W Loc Dev Ea Add Lesion Img Bx Spec US Guide  Addendum Date: 08/08/2018   ADDENDUM REPORT: 08/05/2018 17:04 ADDENDUM: Pathology of the right breast biopsy, 2:30 o'clock position revealed A. BREAST, RIGHT, 2:30 1 CM FROM NIPPLE; ULTRASOUND-GUIDED CORE  BIOPSY: DILATED DUCT/CYST, 6 MM MEASURED ON SLIDE, WITH SCLEROSIS OF THE WALL. NEGATIVE FOR EPITHELIAL PROLIFERATIVE CHANGES, ATYPIA, AND MALIGNANCY. Pathology of the right breast biopsy, 3:00 o'clock position revealed B. BREAST, RIGHT, 3:00 1 CM FROM NIPPLE; ULTRASOUND-GUIDED CORE BIOPSY: INTRADUCTAL PAPILLOMA. NEGATIVE FOR ATYPIA AND MALIGNANCY. Both biopsies found to be concordant by Dr. Derrel Nip. 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 Results and recommendations were relayed to Al Pimple, RN, nurse navigator for [facility] by phone by Jetta Lout, [place]. She will contact Dr. Mike Gip and Honor Loh, NP with results and recommendations. The patient has an appointment at the [facility] on Monday, 08/08/18. Results and recommendations were relayed to the patient by phone by Jetta Lout, [place] on 08/05/18. The patient stated she did well following the biopsy with a small bruise but no bleeding or pain. Post biopsy instructions were reviewed with the patient and all of her questions were answered. She stated that she has an appointment to see Dr. Mike Gip on Monday, 08/08/18 at 1:00 PM and will discuss surgical referral with Dr. Mike Gip at that time. She was encouraged to contact the [facility] with any further questions or concerns. Addendum by Jetta Lout, RRA on 08/05/18. Electronically Signed   By: Marin Olp M.D.   On: 08/05/2018 17:04   Result Date: 08/08/2018 CLINICAL DATA:  Patient presents for ultrasound-guided core needle biopsy of 2 right breast masses located at the 2:30 position and 3 o'clock position of the periareolar region. EXAM: ULTRASOUND GUIDED RIGHT BREAST CORE NEEDLE BIOPSY COMPARISON:  Previous exam(s). FINDINGS: I met with the patient and we discussed the procedure of ultrasound-guided biopsy, including benefits and alternatives. We discussed the high likelihood of a successful procedure. We discussed the  risks of the procedure, including infection, bleeding, tissue injury, clip migration, and inadequate sampling. Informed written consent was given. The usual time-out protocol was performed immediately prior to the procedure. Lesion quadrant: Right upper inner quadrant Using sterile technique and 1% Lidocaine as local anesthetic, under direct ultrasound visualization, a 14 gauge spring-loaded device was used to perform biopsy of the targeted mass at the 2:30 position using a  inferior to superior approach. Two specimens were obtained as the lesion then became too difficult to visualize to allow obtaining additional specimens. At the conclusion of the procedure a heart shaped tissue marker clip was deployed into the biopsy cavity. Follow up 2 view mammogram was performed and dictated separately. Lesion quadrant: Right upper inner quadrant at the 2:30-3 o'clock position Using sterile technique and 1% Lidocaine as local anesthetic, under direct ultrasound visualization, a 14 gauge spring-loaded device was used to perform biopsy of the targeted mass at the 3 o'clock position using a inferior to superior approach. Three tissue specimens were obtained. Mass became difficult to visualize after the second biopsy pass. At the conclusion of the procedure a coil shaped tissue marker clip was deployed into the biopsy cavity. Follow up 2 view mammogram was performed and dictated separately. IMPRESSION: Ultrasound guided biopsy of 2 subcentimeter right breast masses over the 2:30 position and 3 o'clock position of the periareolar region. No apparent complications. Electronically Signed: By: Marin Olp M.D. On: 08/03/2018 09:08   Ct Angio Abd/pel W And/or Wo Contrast  Result Date: 08/08/2018 CLINICAL DATA:  Rectal bleeding x3 days, abdominal pain, nausea EXAM: CTA ABDOMEN AND PELVIS WITHOUT AND WITH CONTRAST TECHNIQUE: Multidetector CT imaging of the abdomen and pelvis was performed using the standard protocol during bolus  administration of intravenous contrast. Multiplanar reconstructed images and MIPs were obtained and reviewed to evaluate the vascular anatomy. CONTRAST:  16m OMNIPAQUE IOHEXOL 350 MG/ML SOLN COMPARISON:  CT abdomen/pelvis dated 06/26/2011 FINDINGS: VASCULAR Aorta: No evidence abdominal aortic aneurysm or dissection. Atherosclerotic calcifications of the abdomen aorta. Celiac: Patent. SMA: Patent. Renals: Patent bilaterally. Mildly narrowed on the right (series 4/image 48). Atherosclerotic calcifications proximally on the left (series 4/image 52). IMA: Patent. Inflow: Patent.  Atherosclerotic calcifications bilaterally Proximal Outflow: Patent.  Atherosclerotic calcifications. Veins: Unremarkable. No findings to suggest active GI bleeding on CT. Review of the MIP images confirms the above findings. NON-VASCULAR Lower chest: Lung bases are clear. Hepatobiliary: Liver is within normal limits. Gallbladder is unremarkable. No intrahepatic or extrahepatic ductal dilatation. Pancreas: Within normal limits. Spleen: Within normal limits. Adrenals/Urinary Tract: Adrenal glands within normal limits. Kidneys are within normal limits.  No hydronephrosis. Bladder is mildly thick-walled. Stomach/Bowel: Stomach is within normal limits. No evidence of bowel obstruction. Normal appendix (series 6/image 96). Wall thickening involving the transverse colon (series 6/image 132) extending to the splenic flexure (series 6/image 51), suspicious for infectious/inflammatory colitis. No pneumatosis or free air. Lymphatic: No suspicious abdominopelvic lymphadenopathy. Reproductive: Status post hysterectomy. Left ovary is within normal limits.  No right adnexal mass Other: Trace pelvic ascites. Musculoskeletal: Degenerative changes of the visualized thoracolumbar spine. Posterior lumbar fixation hardware at L3-5. Right cage fusion at L5-S1. Upper lumbar levoscoliosis. IMPRESSION: VASCULAR No evidence of abdominal aortic aneurysm or dissection.  No findings to suggest active GI bleeding on CT. NON-VASCULAR Wall thickening involving the transverse colon extending to the splenic flexure, suspicious for infectious/inflammatory colitis. Associated trace pelvic ascites. No pneumatosis or free air. Electronically Signed   By: SJulian HyM.D.   On: 08/08/2018 19:09    Assessment and Plan:   CMILIANA GANGWERis a 57y.o. y/o female with history of gastric ulcers diagnosed in August 2019 with hematochezia that began 5 days ago and resolved over the last 24 hours, with CT showing wall thickening at the transverse colon extending to the splenic flexure  Clinical signs and symptoms consistent with likely infectious colitis that is now improving, with resolution of  hematochezia Hemoglobin is stable at baseline, therefore no indication for repeat endoscopy at this time Colonoscopy is recent and was done in August 2019 was 5 mm sigmoid colon polyp removed, fair prep noted and repeat recommended in 6 to 12 months.  We will obtain stool studies to rule out C. difficile and evaluate for infectious causes Since patient had a solid brown bowel movements today, her symptoms are already resolving  Patient was given antibiotics by the ER due to colitis and patient is completing the therapy at this time.  Good oral and hand hygiene discussed in detail and patient verbalized understanding Follow-up with oncology for IV iron transfusions  Patient was asked to call us or go to the ER if melena or hematochezia recurs and she verbalized understanding  Repeat colonoscopy in 6 to 12 months as discussed previously.   Dr Molly Peters

## 2018-08-11 ENCOUNTER — Telehealth: Payer: Self-pay

## 2018-08-11 DIAGNOSIS — K922 Gastrointestinal hemorrhage, unspecified: Secondary | ICD-10-CM

## 2018-08-11 NOTE — Telephone Encounter (Signed)
Left message for pt to call office. She needs repeat blood work this week to f/u the GI Bleed she had gone to the ER for. Asked her to call the office to be seen if she has not already had the blood work done.

## 2018-08-11 NOTE — Telephone Encounter (Signed)
CBC ordered 

## 2018-08-11 NOTE — Telephone Encounter (Signed)
Pt called back. I have patient scheduled 08/12/18 for labs.

## 2018-08-11 NOTE — Telephone Encounter (Signed)
Pt saw GI yesterday. Did not think she needed an OV with DR Silvio Pate. So lab appt was made. Will ask Dr Silvio Pate to place lab orders.

## 2018-08-12 ENCOUNTER — Other Ambulatory Visit (INDEPENDENT_AMBULATORY_CARE_PROVIDER_SITE_OTHER): Payer: Medicare HMO

## 2018-08-12 DIAGNOSIS — K922 Gastrointestinal hemorrhage, unspecified: Secondary | ICD-10-CM

## 2018-08-12 DIAGNOSIS — Z8719 Personal history of other diseases of the digestive system: Secondary | ICD-10-CM | POA: Diagnosis not present

## 2018-08-12 DIAGNOSIS — D508 Other iron deficiency anemias: Secondary | ICD-10-CM | POA: Diagnosis not present

## 2018-08-12 DIAGNOSIS — K921 Melena: Secondary | ICD-10-CM | POA: Diagnosis not present

## 2018-08-12 LAB — CBC
HCT: 31 % — ABNORMAL LOW (ref 36.0–46.0)
Hemoglobin: 10 g/dL — ABNORMAL LOW (ref 12.0–15.0)
MCHC: 32.4 g/dL (ref 30.0–36.0)
MCV: 75.1 fl — AB (ref 78.0–100.0)
Platelets: 394 10*3/uL (ref 150.0–400.0)
RBC: 4.13 Mil/uL (ref 3.87–5.11)
RDW: 24.8 % — ABNORMAL HIGH (ref 11.5–15.5)
WBC: 6.5 10*3/uL (ref 4.0–10.5)

## 2018-08-14 LAB — CLOSTRIDIUM DIFFICILE EIA: C difficile Toxins A+B, EIA: NEGATIVE

## 2018-08-15 ENCOUNTER — Inpatient Hospital Stay: Payer: Medicare HMO | Attending: Hematology and Oncology

## 2018-08-15 VITALS — BP 145/91 | HR 70 | Temp 97.0°F | Resp 20

## 2018-08-15 DIAGNOSIS — D500 Iron deficiency anemia secondary to blood loss (chronic): Secondary | ICD-10-CM

## 2018-08-15 DIAGNOSIS — E538 Deficiency of other specified B group vitamins: Secondary | ICD-10-CM | POA: Diagnosis not present

## 2018-08-15 MED ORDER — SODIUM CHLORIDE 0.9 % IV SOLN: 200.0000 mg | Freq: Once | INTRAVENOUS | Status: DC

## 2018-08-15 MED ORDER — SODIUM CHLORIDE 0.9 % IV SOLN
Freq: Once | INTRAVENOUS | Status: AC
  Administered 2018-08-15: 15:00:00 via INTRAVENOUS
  Filled 2018-08-15: qty 250

## 2018-08-15 MED ORDER — IRON SUCROSE 20 MG/ML IV SOLN
200.0000 mg | Freq: Once | INTRAVENOUS | Status: AC
  Administered 2018-08-15: 200 mg via INTRAVENOUS
  Filled 2018-08-15: qty 10

## 2018-08-16 ENCOUNTER — Ambulatory Visit: Payer: Medicare HMO | Admitting: Surgery

## 2018-08-16 ENCOUNTER — Encounter: Payer: Self-pay | Admitting: Surgery

## 2018-08-16 VITALS — BP 156/93 | HR 82 | Temp 97.5°F | Ht 60.0 in | Wt 91.6 lb

## 2018-08-16 DIAGNOSIS — D241 Benign neoplasm of right breast: Secondary | ICD-10-CM

## 2018-08-16 LAB — GI PROFILE, STOOL, PCR
ADENOVIRUS F 40/41: NOT DETECTED
ASTROVIRUS: NOT DETECTED
C DIFFICILE TOXIN A/B: NOT DETECTED
CAMPYLOBACTER: NOT DETECTED
CYCLOSPORA CAYETANENSIS: NOT DETECTED
Cryptosporidium: NOT DETECTED
ENTAMOEBA HISTOLYTICA: NOT DETECTED
ENTEROAGGREGATIVE E COLI: NOT DETECTED
ENTEROPATHOGENIC E COLI: NOT DETECTED
Enterotoxigenic E coli: NOT DETECTED
Giardia lamblia: NOT DETECTED
Norovirus GI/GII: NOT DETECTED
Plesiomonas shigelloides: NOT DETECTED
Rotavirus A: NOT DETECTED
Salmonella: NOT DETECTED
Sapovirus: NOT DETECTED
Shiga-toxin-producing E coli: NOT DETECTED
Shigella/Enteroinvasive E coli: NOT DETECTED
VIBRIO CHOLERAE: NOT DETECTED
VIBRIO: NOT DETECTED
YERSINIA ENTEROCOLITICA: NOT DETECTED

## 2018-08-16 NOTE — H&P (View-Only) (Signed)
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 Patient reports she noticed a small lump at the site she identifies medial to her Right areola and mentioned this to her primary care physician just prior to her scheduled annual mammogram, for which a diagnostic mammogram was instead ordered with focused ultrasound, followed by ultrasound-guided biopsy. Patient denies any Right breast pain or nipple discharge and states she did not breast feed her 3 now-grown children due to concern regarding the affect of her anti-seizure medications on developing children. She's twice previously underwent core needle biopsies without any pathology concerning for malignancy, and she denies any family history of breast cancer. She has, however, lost weight recently, attributed to poor appetite and recent GI bleed associated with PUD. She no longer complains of SOB.  Of note, patient was recently treated and has been followed by Dr. Bonna Gains for transverse infectious colitis and has been managed by Dr. Mike Gip for corresponding chronic anemia with Venofer 200 mg, since which patient denies any further blood per rectum and decreased lower abdominal pain, +flatus with BM WNL, no recent N/V. Patient describes her appetite remains poor.  PAST MEDICAL HISTORY (PMH):  Past Medical History:  Diagnosis Date  . Anxiety   . Cervical cancer ([place])   . Chronic back pain 11/12/2009   Qualifier: Diagnosis of  By: Maxie Better FNP, Rosalita Levan   . COPD (chronic obstructive pulmonary disease) ([facility])    states SOB with ADLs; no O2 use; able to speak in complete sentences without SOB(11/15/2014)  . Depression   . Difficulty swallowing pills    s/p cervical fusion  . Family history of adverse reaction to  anesthesia    pt's mother and sister have hx. of post-op N/V  . Fibromyalgia   . GERD (gastroesophageal reflux disease)   . History of gastric ulcer   . Hypertension    states under control with med., has been on med. x 1-2 yr.  . IBS (irritable bowel syndrome)   . Internal hemorrhoid    states has had intermittent bright red bleeding with BM (11/15/2014)  . Limited joint range of motion    neck - s/p cervical fusion  . Localized primary osteoarthritis of right shoulder region 11/23/2014  . Osteoarthritis of left shoulder 07/05/2015  . Osteoarthritis of right shoulder 11/2014  . Seizures ([place])    last seizure 2010  . Short-term memory loss   . TMJ syndrome   . Valvular heart disease    Initial workup a number of years ago at [facility].  Echo (10/2009) showed EF 60-65%,      normal LV size, moderate aortic insufficiency with a trileaflet aortic valve and normal aortic root size.  Mild      mitral regurgitation.   . Wears dentures    lower     PAST SURGICAL HISTORY ([place]):  Past Surgical History:  Procedure Laterality Date  . ABDOMINAL HYSTERECTOMY     partial  . ANTERIOR CERVICAL DECOMP/DISCECTOMY FUSION  02/06/2011   C4-5, C5-6, C6-7  . BREAST BIOPSY Bilateral 10+ yrs ago   neg  . BREAST BIOPSY Right 08/03/2018   2 areas bx, path pending  . BREAST LUMPECTOMY Bilateral 1989   x 3 - benign  . CARDIAC CATHETERIZATION  1995  . CESAREAN SECTION  x 2  . COLONOSCOPY  09/28/2008  . COLONOSCOPY N/A 06/15/2018   Procedure: COLONOSCOPY;  Surgeon: Virgel Manifold, MD;  Location: [facility] ENDOSCOPY;  Service: Endoscopy;  Laterality: N/A;  . ESOPHAGOGASTRODUODENOSCOPY  09/28/2008  . ESOPHAGOGASTRODUODENOSCOPY N/A 06/14/2018   Procedure: ESOPHAGOGASTRODUODENOSCOPY (EGD);  Surgeon: Virgel Manifold, MD;  Location: [facility] ENDOSCOPY;  Service: Endoscopy;  Laterality: N/A;  . HARDWARE REMOVAL  11/17/2006   L5-S1  . LAMINECTOMY WITH POSTERIOR LATERAL ARTHRODESIS LEVEL 3  12/06/2009   with synovial  cyst resection L3-4 bilat.  Marland Kitchen LAMINECTOMY WITH POSTERIOR LATERAL ARTHRODESIS LEVEL 3  11/17/2006   L4-5  . NM MYOVIEW LTD     Lexiscan myoview (10/2009): EF 77%, normal wall motion, normal perfusion.   Marland Kitchen SHOULDER ARTHROSCOPY WITH DEBRIDEMENT AND BICEP TENDON REPAIR Right 04/13/2014   Procedure: RIGHT SHOULDER ARTHROSCOPY WITH DEBRIDEMENT EXTENSIVE;  Surgeon: Johnny Bridge, MD;  Location: [place];  Service: Orthopedics;  Laterality: Right;  . TOTAL SHOULDER ARTHROPLASTY Right 11/23/2014   Procedure: TOTAL RIGHT SHOULDER ARTHROPLASTY;  Surgeon: Johnny Bridge, MD;  Location: [place];  Service: Orthopedics;  Laterality: Right;  . TOTAL SHOULDER ARTHROPLASTY Left 07/05/2015   Procedure: LEFT TOTAL SHOULDER REPLACEMENT;  Surgeon: Marchia Bond, MD;  Location: [place];  Service: Orthopedics;  Laterality: Left;     MEDICATIONS:  Prior to Admission medications   Medication Sig Start Date End Date Taking? Authorizing Provider  ALPRAZolam (XANAX) 1 MG tablet TAKE 1 TABLET THREE TIMES DAILY AS NEEDED FOR ANXIETY Patient taking differently: Take 1 mg by mouth 3 (three) times daily as needed for anxiety.  05/02/18  Yes Venia Carbon, MD  gabapentin (NEURONTIN) 300 MG capsule Take 4 capsules (1,200 mg total) by mouth at bedtime. 06/15/18  Yes Mody, Ulice Bold, MD  lamoTRIgine (LAMICTAL) 100 MG tablet TAKE 1 TABLET TWICE DAILY 06/06/18  Yes Venia Carbon, MD  losartan-hydrochlorothiazide (HYZAAR) 100-12.5 MG tablet TAKE 1 TABLET EVERY DAY Patient taking differently: Take 1 tablet by mouth daily.  05/02/18  Yes Venia Carbon, MD  nicotine (NICODERM CQ - DOSED IN MG/24 HOURS) 14 mg/24hr patch Place 1 patch (14 mg total) onto the skin daily. 06/15/18  Yes Mody, Ulice Bold, MD  ondansetron (ZOFRAN ODT) 4 MG disintegrating tablet Take 1 tablet (4 mg total) by mouth every 8 (eight) hours as needed for nausea or vomiting. 08/08/18  Yes Alfred Levins, [place], MD   pantoprazole (PROTONIX) 40 MG tablet Take 1 tablet (40 mg total) by mouth 2 (two) times daily. 06/28/18 09/26/18 Yes [name] B, MD  PARoxetine (PAXIL) 20 MG tablet TAKE 1 TABLET EVERY MORNING 09/08/17  Yes Venia Carbon, MD     ALLERGIES:  Allergies  Allergen Reactions  . Carbamazepine Hives  . Divalproex Sodium Swelling    HAIR FALLS OUT  . Clonazepam Other (See Comments)    HALLUCINATIONS HALLUCINATIONS HALLUCINATIONS  . Divalproex Sodium Swelling    HAIR FALLS OUT  . Diphenhydramine Hcl Other (See Comments)    UNKNOWN  . Diphenhydramine Hcl Rash and Other (See Comments)    UNKNOWN UNKNOWN  . Hydrocodone-Acetaminophen Itching  . Tiotropium Other (See Comments)    CREATES YEAST IN THROAT  . Tiotropium Bromide Monohydrate Other (See Comments)    CREATES YEAST IN THROAT  . Vicodin [Hydrocodone-Acetaminophen] Itching     SOCIAL HISTORY:  Social History   Socioeconomic History  . Marital status: Married    Spouse name: Not on file  . Number  of children: 3  . Years of education: Not on file  . Highest education level: Not on file  Occupational History  . Occupation: Disabled 2003  Social Needs  . Financial resource strain: Not on file  . Food insecurity:    Worry: Not on file    Inability: Not on file  . Transportation needs:    Medical: Not on file    Non-medical: Not on file  Tobacco Use  . Smoking status: Current Every Day Smoker    Packs/day: 1.50    Years: 42.00    Pack years: 63.00    Types: Cigarettes  . Smokeless tobacco: Never Used  Substance and Sexual Activity  . Alcohol use: No    Alcohol/week: 0.0 standard drinks    Comment: occasionally  . Drug use: Yes    Types: Marijuana    Comment: per pt she smoke thc at bedtime and prn  ( for pain relief and to help sleep since not given percocet anymore )-per pt   . Sexual activity: Not on file  Lifestyle  . Physical activity:    Days per week: Not on file    Minutes per session: Not on  file  . Stress: Not on file  Relationships  . Social connections:    Talks on phone: Not on file    Gets together: Not on file    Attends religious service: Not on file    Active member of club or organization: Not on file    Attends meetings of clubs or organizations: Not on file    Relationship status: Not on file  . Intimate partner violence:    Fear of current or ex partner: Not on file    Emotionally abused: Not on file    Physically abused: Not on file    Forced sexual activity: Not on file  Other Topics Concern  . Not on file  Social History Narrative   No living will   Husband, then oldest son Roosvelt Harps, should make decisions   Would accept resuscitation   No tube feeds     The patient currently resides (home / rehab facility / nursing home): Home The patient normally is (ambulatory / bedbound): Ambulatory  FAMILY HISTORY:  Family History  Problem Relation Age of Onset  . Cervical cancer Mother   . Anesthesia problems Mother        post-op N/V  . Rheum arthritis Mother   . Anxiety disorder Mother   . Cancer Father        colon cancer  . Migraines Sister   . Cervical cancer Sister   . Anesthesia problems Sister        post-op N/V  . Migraines Brother   . Asthma Daughter   . Cerebral palsy Daughter        age 58  . Coronary artery disease Maternal Grandmother   . Hypertension Maternal Grandmother   . Diabetes Maternal Grandmother   . Coronary artery disease Paternal Grandmother   . Hypertension Paternal Grandmother   . Diabetes Paternal Grandmother   . Breast cancer Maternal Aunt   . Breast cancer Maternal Aunt   . Breast cancer Maternal Aunt     Otherwise negative/non-contributory.  REVIEW OF SYSTEMS:  Constitutional: denies any other weight loss, fever, chills, or sweats  Eyes: denies any other vision changes, history of eye injury  ENT: denies sore throat, hearing problems  Respiratory: denies shortness of breath, wheezing  Cardiovascular:  denies chest pain, palpitations  Breasts: pain, nipple discharge, and mass(es) as per HPI Gastrointestinal: abdominal pain, N/V, and bowel function as per HPI Musculoskeletal: denies any other joint pains or cramps  Skin: Denies any other rashes or skin discolorations except as per HPI Neurological: denies any other headache, dizziness, weakness  Psychiatric: Denies any other depression, anxiety   All other review of systems were otherwise negative   VITAL SIGNS:  BP (!) 156/93   Pulse 82   Temp (!) 97.5 F (36.4 C) (Temporal)   Ht 5' (1.524 m)   Wt 91 lb 9.6 oz (41.5 kg)   BMI 17.89 kg/m   PHYSICAL EXAM:  Constitutional:  -- Normal body habitus  -- Awake, alert, and oriented x3  Eyes:  -- Pupils equally round and reactive to light  -- No scleral icterus  Ear, nose, throat:  -- No jugular venous distension -- No nasal drainage, bleeding Pulmonary:  -- No crackles  -- Equal breath sounds bilaterally -- Breathing non-labored at rest Cardiovascular:  -- S1, S2 present  -- No pericardial rubs  Breasts: -- B/L no nipple discharge or axillary lymphadenopathy -- No Left breast mass appreciated, possible subtle small mobile Right breast mass at 3 o'clock position 1 cm from areola, B/L non-tender to palpation Gastrointestinal:  -- Abdomen soft, non-tender, and non-distended, no guarding/rebound tenderness  -- No abdominal masses appreciated, pulsatile or otherwise  Musculoskeletal and Integumentary:  -- Wounds or skin discoloration: None appreciated -- Extremities: B/L UE and LE FROM, hands and feet warm, no edema  Neurologic:  -- Motor function: Intact and symmetric -- Sensation: Intact and symmetric  Labs: CBC Latest Ref Rng & Units 08/12/2018 08/08/2018 08/08/2018  WBC 4.0 - 10.5 K/uL 6.5 - 9.0  Hemoglobin 12.0 - 15.0 g/dL 10.0(L) 9.6(L) 10.8(L)  Hematocrit 36.0 - 46.0 % 31.0(L) 28.2(L) 32.0(L)  Platelets 150.0 - 400.0 K/uL 394.0 - 398   CMP Latest Ref Rng & Units  08/08/2018 06/22/2018 06/15/2018  Glucose 70 - 99 mg/dL 102(H) 74 83  BUN 6 - 20 mg/dL 12 8 5(L)  Creatinine 0.44 - 1.00 mg/dL 1.00 0.84 0.83  Sodium 135 - 145 mmol/L 136 137 138  Potassium 3.5 - 5.1 mmol/L 3.3(L) 3.7 3.9  Chloride 98 - 111 mmol/L 99 100 104  CO2 22 - 32 mmol/L 28 26 25   Calcium 8.9 - 10.3 mg/dL 9.5 9.4 9.3  Total Protein 6.5 - 8.1 g/dL 7.3 - -  Total Bilirubin 0.3 - 1.2 mg/dL 0.7 - -  Alkaline Phos 38 - 126 U/L 79 - -  AST 15 - 41 U/L 19 - -  ALT 0 - 44 U/L 9 - -   Imaging studies:  Bilateral Diagnostic Mammogram and Focused Right Breast Ultrasound (07/27/2018) 1. There are 2 indeterminate masses in the right breast at 2:30 and at 3 o'clock. 2. There are no mammographic or targeted sonographic abnormalities in the upper-outer right breast or upper inner left breast to explain the palpable sites identified by the patient. 3.  No mammographic evidence of left breast malignancy.   Pathology: Ultrasound-guided Right Breast Core Needle Biopsy (08/03/2018) A. BREAST, RIGHT, 2:30 1 CM FROM NIPPLE; ULTRASOUND-GUIDED CORE BIOPSY:  - DILATED DUCT/CYST, 6 MM MEASURED ON SLIDE, WITH SCLEROSIS OF THE WALL.  - NEGATIVE FOR EPITHELIAL PROLIFERATIVE CHANGES, ATYPIA, AND MALIGNANCY.   B. BREAST, RIGHT, 3:00 1 CM FROM NIPPLE; ULTRASOUND-GUIDED CORE BIOPSY:  - INTRADUCTAL PAPILLOMA.  - NEGATIVE FOR ATYPIA AND MALIGNANCY.  Assessment/Plan:  57 y.o. female with Right breast non-central  intraductal papilloma, complicated by co-morbidities including HTN, moderate aortic valvular insufficiency, mild mitral valvular regurgitation, COPD, seizure disorder, GERD with history of PUD, ice pica, IBS, fibromyalgia, osteoarthritis, and major depression disorder.   - discussed with patient and her husband imaging and biopsy results  - all risks, benefits, and alternatives to Right breast excisional biopsy (lumpectomy) with intra-operative ultrasound localization were discussed with the patient and  her husband, all of their questions were answered to his/her/their expressed satisfaction, patient expresses she wishes to proceed, and informed consent was obtained.   - will plan for Right breast excisional biopsy using ultrasound pre-incision for localization/confirmation  - though typically performed with general anesthesia, may consider local and sedation  - anticipate return to clinic 2 weeks following above planned procedure  - instructed to call office if any questions or concerns  All of the above recommendations were discussed with the patient and patient's husband, and all of patient's and family's questions were answered to their expressed satisfaction.  Thank you for the opportunity to participate in this patient's care.  -- Marilynne Drivers [name], MD, [place]: [facility] - Partnering for exceptional care. Office: [phone]

## 2018-08-16 NOTE — Patient Instructions (Addendum)
We have spoken today about removing a lump in your breast. This will be done at [facility] by Dr. Tama High at [facility].  You will most likely be able to leave the hospital several hours after your surgery. Rarely, a patient needs to stay over night but this is a possibility.  Plan to tenatively be off work for 1-2 weeks following the surgery and may return with approximately 4 more weeks of a lifting restriction, no greater than 15 lbs.  The patient is scheduled for surgery with Dr Rosana Peters at [facility] on 08/24/18. She will pre admit at the hospital and we will call her with that appointment. The patient is aware of date and instructions.     Lumpectomy A lumpectomy is a form of "breast conserving" or "breast preservation" surgery. It may also be referred to as a partial mastectomy. During a lumpectomy, the portion of the breast that contains the cancerous tumor or breast mass (the lump) is removed. Some normal tissue around the lump may also be removed to make sure all of the tumor has been removed.  LET [facility] CARE PROVIDER KNOW ABOUT:  Any allergies you have.  All medicines you are taking, including vitamins, herbs, eye drops, creams, and over-the-counter medicines.  Previous problems you or members of your family have had with the use of anesthetics.  Any blood disorders you have.  Previous surgeries you have had.  Medical conditions you have. RISKS AND COMPLICATIONS Generally, this is a safe procedure. However, problems can occur and include:  Bleeding.  Infection.  Pain.  Temporary swelling.  Change in the shape of the breast, particularly if a large portion is removed. BEFORE THE PROCEDURE  Ask your health care provider about changing or stopping your regular medicines. This is especially important if you are taking diabetes medicines or blood thinners.  Do not eat or drink anything after midnight on the night before the procedure or as directed by your health care  provider. Ask your health care provider if you can take a sip of water with any approved medicines.  On the day of surgery, your health care provider will use a mammogram or ultrasound to locate and mark the tumor in your breast. These markings on your breast will show where the cut (incision) will be made. PROCEDURE   An IV tube will be put into one of your veins.  You may be given medicine to help you relax before the surgery (sedative). You will be given one of the following:  A medicine that numbs the area (local anesthetic).  A medicine that makes you fall asleep (general anesthetic).  Your health care provider will use a kind of electric scalpel that uses heat to minimize bleeding (electrocautery knife).  A curved incision (like a smile or frown) that follows the natural curve of your breast is made, to allow for minimal scarring and better healing.  The tumor will be removed with some of the surrounding tissue. This will be sent to the lab for analysis. Your health care provider may also remove your lymph nodes at this time if needed.  Sometimes, but not always, a rubber tube called a drain will be surgically inserted into your breast area or armpit to collect excess fluid that may accumulate in the space where the tumor was. This drain is connected to a plastic bulb on the outside of your body. This drain creates suction to help remove the fluid.  The incisions will be closed with stitches (  sutures).  A bandage may be placed over the incisions. AFTER THE PROCEDURE  You will be taken to the recovery area.  You will be given medicine for pain.  A small rubber drain may be placed in the breast for 2-3 days to prevent a collection of blood (hematoma) from developing in the breast. You will be given instructions on caring for the drain before you go home.  A pressure bandage (dressing) will be applied for 1-2 days to prevent bleeding. Ask your health care provider how to care for  your bandage at home.   This information is not intended to replace advice given to you by your health care provider. Make sure you discuss any questions you have with your health care provider.   Document Released: 12/07/2006 Document Revised: 11/16/2014 Document Reviewed: 03/31/2013 Elsevier Interactive Patient Education [employer].

## 2018-08-16 NOTE — Progress Notes (Addendum)
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 Patient reports she noticed a small lump at the site she identifies medial to her Right areola and mentioned this to her primary care physician just prior to her scheduled annual mammogram, for which a diagnostic mammogram was instead ordered with focused ultrasound, followed by ultrasound-guided biopsy. Patient denies any Right breast pain or nipple discharge and states she did not breast feed her 3 now-grown children due to concern regarding the affect of her anti-seizure medications on developing children. She's twice previously underwent core needle biopsies without any pathology concerning for malignancy, and she denies any family history of breast cancer. She has, however, lost weight recently, attributed to poor appetite and recent GI bleed associated with PUD. She no longer complains of SOB.  Of note, patient was recently treated and has been followed by Dr. Bonna Gains for transverse infectious colitis and has been managed by Dr. Mike Gip for corresponding chronic anemia with Venofer 200 mg, since which patient denies any further blood per rectum and decreased lower abdominal pain, +flatus with BM WNL, no recent N/V. Patient describes her appetite remains poor.  PAST MEDICAL HISTORY (PMH):  Past Medical History:  Diagnosis Date  . Anxiety   . Cervical cancer ([place])   . Chronic back pain 11/12/2009   Qualifier: Diagnosis of  By: Maxie Better FNP, Rosalita Levan   . COPD (chronic obstructive pulmonary disease) (HCC)    states SOB with ADLs; no O2 use; able to speak in complete sentences without SOB(11/15/2014)  . Depression   . Difficulty swallowing pills    s/p cervical fusion  . Family history of adverse reaction to  anesthesia    pt's mother and sister have hx. of post-op N/V  . Fibromyalgia   . GERD (gastroesophageal reflux disease)   . History of gastric ulcer   . Hypertension    states under control with med., has been on med. x 1-2 yr.  . IBS (irritable bowel syndrome)   . Internal hemorrhoid    states has had intermittent bright red bleeding with BM (11/15/2014)  . Limited joint range of motion    neck - s/p cervical fusion  . Localized primary osteoarthritis of right shoulder region 11/23/2014  . Osteoarthritis of left shoulder 07/05/2015  . Osteoarthritis of right shoulder 11/2014  . Seizures ([place])    last seizure 2010  . Short-term memory loss   . TMJ syndrome   . Valvular heart disease    Initial workup a number of years ago at [facility].  Echo (10/2009) showed EF 60-65%,      normal LV size, moderate aortic insufficiency with a trileaflet aortic valve and normal aortic root size.  Mild      mitral regurgitation.   . Wears dentures    lower     PAST SURGICAL HISTORY ([place]):  Past Surgical History:  Procedure Laterality Date  . ABDOMINAL HYSTERECTOMY     partial  . ANTERIOR CERVICAL DECOMP/DISCECTOMY FUSION  02/06/2011   C4-5, C5-6, C6-7  . BREAST BIOPSY Bilateral 10+ yrs ago   neg  . BREAST BIOPSY Right 08/03/2018   2 areas bx, path pending  . BREAST LUMPECTOMY Bilateral 1989   x 3 - benign  . CARDIAC CATHETERIZATION  1995  . CESAREAN SECTION  x 2  . COLONOSCOPY  09/28/2008  . COLONOSCOPY N/A 06/15/2018   Procedure: COLONOSCOPY;  Surgeon: Virgel Manifold, MD;  Location: [facility] ENDOSCOPY;  Service: Endoscopy;  Laterality: N/A;  . ESOPHAGOGASTRODUODENOSCOPY  09/28/2008  . ESOPHAGOGASTRODUODENOSCOPY N/A 06/14/2018   Procedure: ESOPHAGOGASTRODUODENOSCOPY (EGD);  Surgeon: Virgel Manifold, MD;  Location: [facility] ENDOSCOPY;  Service: Endoscopy;  Laterality: N/A;  . HARDWARE REMOVAL  11/17/2006   L5-S1  . LAMINECTOMY WITH POSTERIOR LATERAL ARTHRODESIS LEVEL 3  12/06/2009   with synovial  cyst resection L3-4 bilat.  Marland Kitchen LAMINECTOMY WITH POSTERIOR LATERAL ARTHRODESIS LEVEL 3  11/17/2006   L4-5  . NM MYOVIEW LTD     Lexiscan myoview (10/2009): EF 77%, normal wall motion, normal perfusion.   Marland Kitchen SHOULDER ARTHROSCOPY WITH DEBRIDEMENT AND BICEP TENDON REPAIR Right 04/13/2014   Procedure: RIGHT SHOULDER ARTHROSCOPY WITH DEBRIDEMENT EXTENSIVE;  Surgeon: Johnny Bridge, MD;  Location: [place];  Service: Orthopedics;  Laterality: Right;  . TOTAL SHOULDER ARTHROPLASTY Right 11/23/2014   Procedure: TOTAL RIGHT SHOULDER ARTHROPLASTY;  Surgeon: Johnny Bridge, MD;  Location: [place];  Service: Orthopedics;  Laterality: Right;  . TOTAL SHOULDER ARTHROPLASTY Left 07/05/2015   Procedure: LEFT TOTAL SHOULDER REPLACEMENT;  Surgeon: Marchia Bond, MD;  Location: [place];  Service: Orthopedics;  Laterality: Left;     MEDICATIONS:  Prior to Admission medications   Medication Sig Start Date End Date Taking? Authorizing Provider  ALPRAZolam (XANAX) 1 MG tablet TAKE 1 TABLET THREE TIMES DAILY AS NEEDED FOR ANXIETY Patient taking differently: Take 1 mg by mouth 3 (three) times daily as needed for anxiety.  05/02/18  Yes Venia Carbon, MD  gabapentin (NEURONTIN) 300 MG capsule Take 4 capsules (1,200 mg total) by mouth at bedtime. 06/15/18  Yes Mody, Ulice Bold, MD  lamoTRIgine (LAMICTAL) 100 MG tablet TAKE 1 TABLET TWICE DAILY 06/06/18  Yes Venia Carbon, MD  losartan-hydrochlorothiazide (HYZAAR) 100-12.5 MG tablet TAKE 1 TABLET EVERY DAY Patient taking differently: Take 1 tablet by mouth daily.  05/02/18  Yes Venia Carbon, MD  nicotine (NICODERM CQ - DOSED IN MG/24 HOURS) 14 mg/24hr patch Place 1 patch (14 mg total) onto the skin daily. 06/15/18  Yes Mody, Ulice Bold, MD  ondansetron (ZOFRAN ODT) 4 MG disintegrating tablet Take 1 tablet (4 mg total) by mouth every 8 (eight) hours as needed for nausea or vomiting. 08/08/18  Yes Alfred Levins, [place], MD   pantoprazole (PROTONIX) 40 MG tablet Take 1 tablet (40 mg total) by mouth 2 (two) times daily. 06/28/18 09/26/18 Yes [name] B, MD  PARoxetine (PAXIL) 20 MG tablet TAKE 1 TABLET EVERY MORNING 09/08/17  Yes Venia Carbon, MD     ALLERGIES:  Allergies  Allergen Reactions  . Carbamazepine Hives  . Divalproex Sodium Swelling    HAIR FALLS OUT  . Clonazepam Other (See Comments)    HALLUCINATIONS HALLUCINATIONS HALLUCINATIONS  . Divalproex Sodium Swelling    HAIR FALLS OUT  . Diphenhydramine Hcl Other (See Comments)    UNKNOWN  . Diphenhydramine Hcl Rash and Other (See Comments)    UNKNOWN UNKNOWN  . Hydrocodone-Acetaminophen Itching  . Tiotropium Other (See Comments)    CREATES YEAST IN THROAT  . Tiotropium Bromide Monohydrate Other (See Comments)    CREATES YEAST IN THROAT  . Vicodin [Hydrocodone-Acetaminophen] Itching     SOCIAL HISTORY:  Social History   Socioeconomic History  . Marital status: Married    Spouse name: Not on file  . Number  of children: 3  . Years of education: Not on file  . Highest education level: Not on file  Occupational History  . Occupation: Disabled 2003  Social Needs  . Financial resource strain: Not on file  . Food insecurity:    Worry: Not on file    Inability: Not on file  . Transportation needs:    Medical: Not on file    Non-medical: Not on file  Tobacco Use  . Smoking status: Current Every Day Smoker    Packs/day: 1.50    Years: 42.00    Pack years: 63.00    Types: Cigarettes  . Smokeless tobacco: Never Used  Substance and Sexual Activity  . Alcohol use: No    Alcohol/week: 0.0 standard drinks    Comment: occasionally  . Drug use: Yes    Types: Marijuana    Comment: per pt she smoke thc at bedtime and prn  ( for pain relief and to help sleep since not given percocet anymore )-per pt   . Sexual activity: Not on file  Lifestyle  . Physical activity:    Days per week: Not on file    Minutes per session: Not on  file  . Stress: Not on file  Relationships  . Social connections:    Talks on phone: Not on file    Gets together: Not on file    Attends religious service: Not on file    Active member of club or organization: Not on file    Attends meetings of clubs or organizations: Not on file    Relationship status: Not on file  . Intimate partner violence:    Fear of current or ex partner: Not on file    Emotionally abused: Not on file    Physically abused: Not on file    Forced sexual activity: Not on file  Other Topics Concern  . Not on file  Social History Narrative   No living will   Husband, then oldest son Roosvelt Harps, should make decisions   Would accept resuscitation   No tube feeds     The patient currently resides (home / rehab facility / nursing home): Home The patient normally is (ambulatory / bedbound): Ambulatory  FAMILY HISTORY:  Family History  Problem Relation Age of Onset  . Cervical cancer Mother   . Anesthesia problems Mother        post-op N/V  . Rheum arthritis Mother   . Anxiety disorder Mother   . Cancer Father        colon cancer  . Migraines Sister   . Cervical cancer Sister   . Anesthesia problems Sister        post-op N/V  . Migraines Brother   . Asthma Daughter   . Cerebral palsy Daughter        age 58  . Coronary artery disease Maternal Grandmother   . Hypertension Maternal Grandmother   . Diabetes Maternal Grandmother   . Coronary artery disease Paternal Grandmother   . Hypertension Paternal Grandmother   . Diabetes Paternal Grandmother   . Breast cancer Maternal Aunt   . Breast cancer Maternal Aunt   . Breast cancer Maternal Aunt     Otherwise negative/non-contributory.  REVIEW OF SYSTEMS:  Constitutional: denies any other weight loss, fever, chills, or sweats  Eyes: denies any other vision changes, history of eye injury  ENT: denies sore throat, hearing problems  Respiratory: denies shortness of breath, wheezing  Cardiovascular:  denies chest pain, palpitations  Breasts: pain, nipple discharge, and mass(es) as per HPI Gastrointestinal: abdominal pain, N/V, and bowel function as per HPI Musculoskeletal: denies any other joint pains or cramps  Skin: Denies any other rashes or skin discolorations except as per HPI Neurological: denies any other headache, dizziness, weakness  Psychiatric: Denies any other depression, anxiety   All other review of systems were otherwise negative   VITAL SIGNS:  BP (!) 156/93   Pulse 82   Temp (!) 97.5 F (36.4 C) (Temporal)   Ht 5' (1.524 m)   Wt 91 lb 9.6 oz (41.5 kg)   BMI 17.89 kg/m   PHYSICAL EXAM:  Constitutional:  -- Normal body habitus  -- Awake, alert, and oriented x3  Eyes:  -- Pupils equally round and reactive to light  -- No scleral icterus  Ear, nose, throat:  -- No jugular venous distension -- No nasal drainage, bleeding Pulmonary:  -- No crackles  -- Equal breath sounds bilaterally -- Breathing non-labored at rest Cardiovascular:  -- S1, S2 present  -- No pericardial rubs  Breasts: -- B/L no nipple discharge or axillary lymphadenopathy -- No Left breast mass appreciated, possible subtle small mobile Right breast mass at 3 o'clock position 1 cm from areola, B/L non-tender to palpation Gastrointestinal:  -- Abdomen soft, non-tender, and non-distended, no guarding/rebound tenderness  -- No abdominal masses appreciated, pulsatile or otherwise  Musculoskeletal and Integumentary:  -- Wounds or skin discoloration: None appreciated -- Extremities: B/L UE and LE FROM, hands and feet warm, no edema  Neurologic:  -- Motor function: Intact and symmetric -- Sensation: Intact and symmetric  Labs: CBC Latest Ref Rng & Units 08/12/2018 08/08/2018 08/08/2018  WBC 4.0 - 10.5 K/uL 6.5 - 9.0  Hemoglobin 12.0 - 15.0 g/dL 10.0(L) 9.6(L) 10.8(L)  Hematocrit 36.0 - 46.0 % 31.0(L) 28.2(L) 32.0(L)  Platelets 150.0 - 400.0 K/uL 394.0 - 398   CMP Latest Ref Rng & Units  08/08/2018 06/22/2018 06/15/2018  Glucose 70 - 99 mg/dL 102(H) 74 83  BUN 6 - 20 mg/dL 12 8 5(L)  Creatinine 0.44 - 1.00 mg/dL 1.00 0.84 0.83  Sodium 135 - 145 mmol/L 136 137 138  Potassium 3.5 - 5.1 mmol/L 3.3(L) 3.7 3.9  Chloride 98 - 111 mmol/L 99 100 104  CO2 22 - 32 mmol/L 28 26 25   Calcium 8.9 - 10.3 mg/dL 9.5 9.4 9.3  Total Protein 6.5 - 8.1 g/dL 7.3 - -  Total Bilirubin 0.3 - 1.2 mg/dL 0.7 - -  Alkaline Phos 38 - 126 U/L 79 - -  AST 15 - 41 U/L 19 - -  ALT 0 - 44 U/L 9 - -   Imaging studies:  Bilateral Diagnostic Mammogram and Focused Right Breast Ultrasound (07/27/2018) 1. There are 2 indeterminate masses in the right breast at 2:30 and at 3 o'clock. 2. There are no mammographic or targeted sonographic abnormalities in the upper-outer right breast or upper inner left breast to explain the palpable sites identified by the patient. 3.  No mammographic evidence of left breast malignancy.   Pathology: Ultrasound-guided Right Breast Core Needle Biopsy (08/03/2018) A. BREAST, RIGHT, 2:30 1 CM FROM NIPPLE; ULTRASOUND-GUIDED CORE BIOPSY:  - DILATED DUCT/CYST, 6 MM MEASURED ON SLIDE, WITH SCLEROSIS OF THE WALL.  - NEGATIVE FOR EPITHELIAL PROLIFERATIVE CHANGES, ATYPIA, AND MALIGNANCY.   B. BREAST, RIGHT, 3:00 1 CM FROM NIPPLE; ULTRASOUND-GUIDED CORE BIOPSY:  - INTRADUCTAL PAPILLOMA.  - NEGATIVE FOR ATYPIA AND MALIGNANCY.  Assessment/Plan:  57 y.o. female with Right breast non-central  intraductal papilloma, complicated by co-morbidities including HTN, moderate aortic valvular insufficiency, mild mitral valvular regurgitation, COPD, seizure disorder, GERD with history of PUD, ice pica, IBS, fibromyalgia, osteoarthritis, and major depression disorder.   - discussed with patient and her husband imaging and biopsy results  - all risks, benefits, and alternatives to Right breast excisional biopsy (lumpectomy) with intra-operative ultrasound localization were discussed with the patient and  her husband, all of their questions were answered to his/her/their expressed satisfaction, patient expresses she wishes to proceed, and informed consent was obtained.   - will plan for Right breast excisional biopsy using ultrasound pre-incision for localization/confirmation  - though typically performed with general anesthesia, may consider local and sedation  - anticipate return to clinic 2 weeks following above planned procedure  - instructed to call office if any questions or concerns  All of the above recommendations were discussed with the patient and patient's husband, and all of patient's and family's questions were answered to their expressed satisfaction.  Thank you for the opportunity to participate in this patient's care.  -- Marilynne Drivers [name], MD, [place]: [facility] - Partnering for exceptional care. Office: [phone]

## 2018-08-17 ENCOUNTER — Telehealth: Payer: Self-pay

## 2018-08-17 NOTE — Telephone Encounter (Signed)
The patient will pre admit at the [facility] on 08/19/18 at 9:00 am. Message left for the patient to call for information.

## 2018-08-17 NOTE — Telephone Encounter (Signed)
Patient called back and notified of pre admit appointment date and time.

## 2018-08-18 NOTE — Addendum Note (Signed)
Addended by: Priscille Heidelberg on: 08/18/2018 11:22 PM   Modules accepted: Orders

## 2018-08-19 ENCOUNTER — Encounter
Admission: RE | Admit: 2018-08-19 | Discharge: 2018-08-19 | Disposition: A | Discharge status: Home / Self Care | Payer: Medicare HMO | Source: Ambulatory Visit | Attending: Surgery | Admitting: Surgery

## 2018-08-19 ENCOUNTER — Other Ambulatory Visit: Payer: Self-pay

## 2018-08-19 DIAGNOSIS — Z01812 Encounter for preprocedural laboratory examination: Secondary | ICD-10-CM | POA: Insufficient documentation

## 2018-08-19 HISTORY — DX: Pneumonia, unspecified organism: J18.9

## 2018-08-19 HISTORY — DX: Cardiac murmur, unspecified: R01.1

## 2018-08-19 LAB — BASIC METABOLIC PANEL
ANION GAP: 10 (ref 5–15)
BUN: 7 mg/dL (ref 6–20)
CALCIUM: 9.1 mg/dL (ref 8.9–10.3)
CO2: 30 mmol/L (ref 22–32)
Chloride: 99 mmol/L (ref 98–111)
Creatinine, Ser: 0.87 mg/dL (ref 0.44–1.00)
GFR calc Af Amer: >60 mL/min (ref >60)
GFR calc non Af Amer: >60 mL/min (ref >60)
Glucose, Bld: 102 mg/dL — ABNORMAL HIGH (ref 70–99)
POTASSIUM: 3.1 mmol/L — AB (ref 3.5–5.1)
Sodium: 139 mmol/L (ref 135–145)

## 2018-08-19 NOTE — Pre-Procedure Instructions (Signed)
BMP results faxed to Dr. Shann Medal office with request to order K supplement prior to surgery and contact patient.  I-stat ordered for DOS.

## 2018-08-19 NOTE — Patient Instructions (Signed)
  Your procedure is scheduled on: Wednesday August 24, 2018 Report to Same Day Surgery 2nd floor [facility] Entrance-take elevator on left to 2nd floor.  Check in with surgery information desk.) To find out your arrival time, call [phone] 1:00-3:00 PM on Tuesday August 23, 2018  Remember: Instructions that are not followed completely may result in serious medical risk, up to and including death, or upon the discretion of your surgeon and anesthesiologist your surgery may need to be rescheduled.    __x__ 1. Do not eat food (including mints, candies, chewing gum) after midnight the night before your procedure. You may drink clear liquids up to 2 hours before you are scheduled to arrive at the hospital for your procedure.  Do not drink anything within 2 hours of your scheduled arrival to the hospital.  Approved clear liquids:  --Water or Apple juice without pulp  --Clear carbohydrate beverage such as Gatorade or Powerade  --Black Coffee or Clear Tea (No milk, no creamers, do not add anything to the coffee or tea)    __x__ 2. No Alcohol for 24 hours before or after surgery.   __x__ 3. No Smoking or e-cigarettes for 24 hours before surgery.  Do not use any chewable tobacco products for at least 6 hours before surgery.   __x__ 4. Notify your doctor if there is any change in your medical condition (cold, fever, infections).   __x__ 5. On the morning of surgery brush your teeth with toothpaste and water.  You may rinse your mouth with mouthwash if you wish.  Do not swallow any toothpaste or mouthwash.  Please read over the following fact sheets that you were given:   [facility] Preparing for Surgery and/or MRSA Information    __x__ Use CHG Soap or Sage wipes as directed on instruction sheet   Do not wear jewelry, make-up, hairpins, clips or nail polish on the day of surgery.  Do not wear lotions, powders, deodorant, or perfumes.   Do not shave below the face/neck 48  hours prior to surgery.   Do not bring valuables to the hospital.    [facility] is not responsible for any belongings or valuables.               Contacts, dentures or bridgework may not be worn into surgery.  For patients discharged on the day of surgery, you will NOT be permitted to drive yourself home.  You must have a responsible adult with you for 24 hours after surgery.  __x__ Take anti-hypertensive listed below, cardiac, seizure, asthma, anti-reflux and psychiatric medicines. These include:  1. On the day of surgery, take Pantoprazole (Protonix), Lamotrigine (Lamictal), Alprazolam (Xanax) and Paroxetine (Paxil)  2. Do not take your Losartan-HCTZ (Hyzaar) on the day of surgery.  __x__ Follow recommendations from Cardiologist, Pulmonologist or PCP regarding stopping Aspirin, Coumadin, Plavix, Eliquis, Effient, Pradaxa, and Pletal.  __x__ Stop Anti-inflammatories such as Advil, Ibuprofen, Motrin, Aleve, Naproxen, Naprosyn, BC/Goodies powders or aspirin products. You may continue to take Tylenol and Celebrex.   __x__ Stop supplements until after surgery. You may continue to take Vitamin D, Vitamin B, and multivitamin.

## 2018-08-22 ENCOUNTER — Telehealth: Payer: Self-pay | Admitting: *Deleted

## 2018-08-22 ENCOUNTER — Inpatient Hospital Stay: Payer: Medicare HMO

## 2018-08-22 VITALS — BP 147/86 | HR 73 | Temp 96.9°F | Resp 18

## 2018-08-22 DIAGNOSIS — E538 Deficiency of other specified B group vitamins: Secondary | ICD-10-CM | POA: Diagnosis not present

## 2018-08-22 DIAGNOSIS — D500 Iron deficiency anemia secondary to blood loss (chronic): Secondary | ICD-10-CM

## 2018-08-22 MED ORDER — IRON SUCROSE 20 MG/ML IV SOLN
200.0000 mg | Freq: Once | INTRAVENOUS | Status: AC
  Administered 2018-08-22: 200 mg via INTRAVENOUS
  Filled 2018-08-22: qty 10

## 2018-08-22 MED ORDER — SODIUM CHLORIDE 0.9 % IV SOLN
Freq: Once | INTRAVENOUS | Status: AC
  Administered 2018-08-22: 14:00:00 via INTRAVENOUS
  Filled 2018-08-22: qty 250

## 2018-08-22 NOTE — Telephone Encounter (Signed)
Message left on patient's cell phone today regarding low potassium.   A prescription has been called in to [facility] for potassium 20 meq two po BID #10 no refills.   Patient instructed to begin prescription today and continue until surgery.   Potassium will be rechecked the morning of procedure.

## 2018-08-23 MED ORDER — CEFAZOLIN SODIUM-DEXTROSE 2-4 GM/100ML-% IV SOLN
2.0000 g | INTRAVENOUS | Status: AC
  Administered 2018-08-24: 2 g via INTRAVENOUS

## 2018-08-24 ENCOUNTER — Ambulatory Visit: Payer: Medicare HMO | Admitting: Anesthesiology

## 2018-08-24 ENCOUNTER — Other Ambulatory Visit: Payer: Self-pay

## 2018-08-24 ENCOUNTER — Encounter: Payer: Self-pay | Admitting: Anesthesiology

## 2018-08-24 ENCOUNTER — Encounter
Admission: RE | Disposition: A | Discharge status: Home / Self Care | Payer: Self-pay | Source: Ambulatory Visit | Attending: Surgery

## 2018-08-24 ENCOUNTER — Ambulatory Visit
Admission: RE | Admit: 2018-08-24 | Discharge: 2018-08-24 | Disposition: A | Discharge status: Home / Self Care | Payer: Medicare HMO | Source: Ambulatory Visit | Attending: Surgery | Admitting: Surgery

## 2018-08-24 ENCOUNTER — Ambulatory Visit: Payer: Medicare HMO

## 2018-08-24 DIAGNOSIS — K589 Irritable bowel syndrome without diarrhea: Secondary | ICD-10-CM | POA: Diagnosis not present

## 2018-08-24 DIAGNOSIS — G8929 Other chronic pain: Secondary | ICD-10-CM | POA: Insufficient documentation

## 2018-08-24 DIAGNOSIS — K648 Other hemorrhoids: Secondary | ICD-10-CM | POA: Insufficient documentation

## 2018-08-24 DIAGNOSIS — D249 Benign neoplasm of unspecified breast: Secondary | ICD-10-CM

## 2018-08-24 DIAGNOSIS — E782 Mixed hyperlipidemia: Secondary | ICD-10-CM | POA: Diagnosis not present

## 2018-08-24 DIAGNOSIS — Z96612 Presence of left artificial shoulder joint: Secondary | ICD-10-CM | POA: Insufficient documentation

## 2018-08-24 DIAGNOSIS — R413 Other amnesia: Secondary | ICD-10-CM | POA: Diagnosis not present

## 2018-08-24 DIAGNOSIS — F329 Major depressive disorder, single episode, unspecified: Secondary | ICD-10-CM | POA: Insufficient documentation

## 2018-08-24 DIAGNOSIS — I340 Nonrheumatic mitral (valve) insufficiency: Secondary | ICD-10-CM | POA: Insufficient documentation

## 2018-08-24 DIAGNOSIS — K219 Gastro-esophageal reflux disease without esophagitis: Secondary | ICD-10-CM | POA: Insufficient documentation

## 2018-08-24 DIAGNOSIS — Z8711 Personal history of peptic ulcer disease: Secondary | ICD-10-CM | POA: Insufficient documentation

## 2018-08-24 DIAGNOSIS — J449 Chronic obstructive pulmonary disease, unspecified: Secondary | ICD-10-CM | POA: Diagnosis not present

## 2018-08-24 DIAGNOSIS — G40909 Epilepsy, unspecified, not intractable, without status epilepticus: Secondary | ICD-10-CM | POA: Insufficient documentation

## 2018-08-24 DIAGNOSIS — I10 Essential (primary) hypertension: Secondary | ICD-10-CM | POA: Diagnosis not present

## 2018-08-24 DIAGNOSIS — D649 Anemia, unspecified: Secondary | ICD-10-CM | POA: Insufficient documentation

## 2018-08-24 DIAGNOSIS — N6001 Solitary cyst of right breast: Secondary | ICD-10-CM | POA: Insufficient documentation

## 2018-08-24 DIAGNOSIS — M797 Fibromyalgia: Secondary | ICD-10-CM | POA: Insufficient documentation

## 2018-08-24 DIAGNOSIS — Z981 Arthrodesis status: Secondary | ICD-10-CM | POA: Insufficient documentation

## 2018-08-24 DIAGNOSIS — Z79899 Other long term (current) drug therapy: Secondary | ICD-10-CM | POA: Diagnosis not present

## 2018-08-24 DIAGNOSIS — Z96611 Presence of right artificial shoulder joint: Secondary | ICD-10-CM | POA: Insufficient documentation

## 2018-08-24 DIAGNOSIS — F17210 Nicotine dependence, cigarettes, uncomplicated: Secondary | ICD-10-CM | POA: Insufficient documentation

## 2018-08-24 DIAGNOSIS — M26629 Arthralgia of temporomandibular joint, unspecified side: Secondary | ICD-10-CM | POA: Insufficient documentation

## 2018-08-24 DIAGNOSIS — D241 Benign neoplasm of right breast: Secondary | ICD-10-CM | POA: Insufficient documentation

## 2018-08-24 DIAGNOSIS — R1310 Dysphagia, unspecified: Secondary | ICD-10-CM | POA: Diagnosis not present

## 2018-08-24 DIAGNOSIS — Z8719 Personal history of other diseases of the digestive system: Secondary | ICD-10-CM | POA: Insufficient documentation

## 2018-08-24 DIAGNOSIS — R918 Other nonspecific abnormal finding of lung field: Secondary | ICD-10-CM | POA: Insufficient documentation

## 2018-08-24 DIAGNOSIS — Z888 Allergy status to other drugs, medicaments and biological substances status: Secondary | ICD-10-CM | POA: Insufficient documentation

## 2018-08-24 DIAGNOSIS — R928 Other abnormal and inconclusive findings on diagnostic imaging of breast: Secondary | ICD-10-CM | POA: Diagnosis not present

## 2018-08-24 DIAGNOSIS — F419 Anxiety disorder, unspecified: Secondary | ICD-10-CM | POA: Insufficient documentation

## 2018-08-24 DIAGNOSIS — Z90710 Acquired absence of both cervix and uterus: Secondary | ICD-10-CM | POA: Insufficient documentation

## 2018-08-24 DIAGNOSIS — N6041 Mammary duct ectasia of right breast: Secondary | ICD-10-CM | POA: Insufficient documentation

## 2018-08-24 DIAGNOSIS — M549 Dorsalgia, unspecified: Secondary | ICD-10-CM | POA: Insufficient documentation

## 2018-08-24 DIAGNOSIS — F418 Other specified anxiety disorders: Secondary | ICD-10-CM | POA: Diagnosis not present

## 2018-08-24 DIAGNOSIS — Z8541 Personal history of malignant neoplasm of cervix uteri: Secondary | ICD-10-CM | POA: Insufficient documentation

## 2018-08-24 DIAGNOSIS — Z885 Allergy status to narcotic agent status: Secondary | ICD-10-CM | POA: Insufficient documentation

## 2018-08-24 HISTORY — PX: BREAST LUMPECTOMY: SHX2

## 2018-08-24 LAB — URINE DRUG SCREEN, QUALITATIVE (ARMC ONLY)
AMPHETAMINES, UR SCREEN: NOT DETECTED
BARBITURATES, UR SCREEN: NOT DETECTED
Benzodiazepine, Ur Scrn: POSITIVE — AB
COCAINE METABOLITE, UR ~~LOC~~: NOT DETECTED
Cannabinoid 50 Ng, Ur ~~LOC~~: POSITIVE — AB
MDMA (Ecstasy)Ur Screen: NOT DETECTED
METHADONE SCREEN, URINE: NOT DETECTED
OPIATE, UR SCREEN: NOT DETECTED
Phencyclidine (PCP) Ur S: NOT DETECTED
TRICYCLIC, UR SCREEN: NOT DETECTED

## 2018-08-24 LAB — POCT I-STAT 4, (NA,K, GLUC, HGB,HCT)
Glucose, Bld: 83 mg/dL (ref 70–99)
HCT: 34 % — ABNORMAL LOW (ref 36.0–46.0)
Hemoglobin: 11.6 g/dL — ABNORMAL LOW (ref 12.0–15.0)
Potassium: 4.9 mmol/L (ref 3.5–5.1)
Sodium: 138 mmol/L (ref 135–145)

## 2018-08-24 SURGERY — BREAST LUMPECTOMY
Anesthesia: General | Site: Breast | Laterality: Right

## 2018-08-24 MED ORDER — PHENYLEPHRINE HCL 10 MG/ML IJ SOLN
INTRAMUSCULAR | Status: AC
  Filled 2018-08-24: qty 1

## 2018-08-24 MED ORDER — FENTANYL CITRATE (PF) 100 MCG/2ML IJ SOLN
INTRAMUSCULAR | Status: AC
  Filled 2018-08-24: qty 2

## 2018-08-24 MED ORDER — FENTANYL CITRATE (PF) 100 MCG/2ML IJ SOLN
INTRAMUSCULAR | Status: DC | PRN
  Administered 2018-08-24: 25 ug via INTRAVENOUS
  Administered 2018-08-24: 50 ug via INTRAVENOUS
  Administered 2018-08-24: 25 ug via INTRAVENOUS

## 2018-08-24 MED ORDER — BUPIVACAINE HCL (PF) 0.5 % IJ SOLN
INTRAMUSCULAR | Status: DC | PRN
  Administered 2018-08-24: 5 mL

## 2018-08-24 MED ORDER — LIDOCAINE HCL (PF) 2 % IJ SOLN
INTRAMUSCULAR | Status: AC
  Filled 2018-08-24: qty 10

## 2018-08-24 MED ORDER — PROPOFOL 10 MG/ML IV BOLUS
INTRAVENOUS | Status: DC | PRN
  Administered 2018-08-24: 90 mg via INTRAVENOUS

## 2018-08-24 MED ORDER — LACTATED RINGERS IV SOLN
INTRAVENOUS | Status: DC
  Administered 2018-08-24: 10:00:00 via INTRAVENOUS

## 2018-08-24 MED ORDER — ONDANSETRON HCL 4 MG/2ML IJ SOLN: 4.0000 mg | Freq: Once | INTRAMUSCULAR | Status: DC | PRN

## 2018-08-24 MED ORDER — FENTANYL CITRATE (PF) 100 MCG/2ML IJ SOLN: 25.0000 ug | INTRAMUSCULAR | Status: DC | PRN

## 2018-08-24 MED ORDER — ONDANSETRON HCL 4 MG/2ML IJ SOLN
INTRAMUSCULAR | Status: DC | PRN
  Administered 2018-08-24: 4 mg via INTRAVENOUS

## 2018-08-24 MED ORDER — CHLORHEXIDINE GLUCONATE CLOTH 2 % EX PADS: 6.0000 | MEDICATED_PAD | Freq: Once | CUTANEOUS | Status: DC

## 2018-08-24 MED ORDER — CEFAZOLIN SODIUM-DEXTROSE 2-4 GM/100ML-% IV SOLN
INTRAVENOUS | Status: AC
  Filled 2018-08-24: qty 100

## 2018-08-24 MED ORDER — LIDOCAINE-EPINEPHRINE (PF) 1 %-1:200000 IJ SOLN
INTRAMUSCULAR | Status: DC | PRN
  Administered 2018-08-24: 5 mL

## 2018-08-24 MED ORDER — BUPIVACAINE HCL (PF) 0.5 % IJ SOLN
INTRAMUSCULAR | Status: AC
  Filled 2018-08-24: qty 30

## 2018-08-24 MED ORDER — PROPOFOL 10 MG/ML IV BOLUS
INTRAVENOUS | Status: AC
  Filled 2018-08-24: qty 20

## 2018-08-24 MED ORDER — LIDOCAINE-EPINEPHRINE (PF) 1 %-1:200000 IJ SOLN
INTRAMUSCULAR | Status: AC
  Filled 2018-08-24: qty 30

## 2018-08-24 MED ORDER — PHENYLEPHRINE HCL 10 MG/ML IJ SOLN
INTRAMUSCULAR | Status: DC | PRN
  Administered 2018-08-24 (×4): 100 ug via INTRAVENOUS

## 2018-08-24 MED ORDER — LIDOCAINE HCL (CARDIAC) PF 100 MG/5ML IV SOSY
PREFILLED_SYRINGE | INTRAVENOUS | Status: DC | PRN
  Administered 2018-08-24: 50 mg via INTRAVENOUS

## 2018-08-24 MED ORDER — ACETAMINOPHEN 500 MG PO TABS
ORAL_TABLET | ORAL | Status: AC
  Administered 2018-08-24: 1000 mg via ORAL
  Filled 2018-08-24: qty 2

## 2018-08-24 MED ORDER — ACETAMINOPHEN 500 MG PO TABS
1000.0000 mg | ORAL_TABLET | ORAL | Status: AC
  Administered 2018-08-24: 1000 mg via ORAL

## 2018-08-24 MED ORDER — OXYCODONE-ACETAMINOPHEN 5-325 MG PO TABS: 1.0000 | ORAL_TABLET | ORAL | 0 refills | Status: DC | PRN

## 2018-08-24 MED ORDER — ONDANSETRON HCL 4 MG/2ML IJ SOLN
INTRAMUSCULAR | Status: AC
  Filled 2018-08-24: qty 2

## 2018-08-24 SURGICAL SUPPLY — 32 items
ADH SKN CLS APL DERMABOND .7 (GAUZE/BANDAGES/DRESSINGS) ×1
BLADE SURG 15 STRL LF DISP TIS (BLADE) ×1 IMPLANT
BLADE SURG 15 STRL SS (BLADE) ×2
CANISTER SUCT 1200ML W/VALVE (MISCELLANEOUS) ×2 IMPLANT
CHLORAPREP W/TINT 26ML (MISCELLANEOUS) ×2 IMPLANT
COVER PROBE FLX POLY STRL (MISCELLANEOUS) ×2 IMPLANT
COVER WAND RF STERILE (DRAPES) ×1 IMPLANT
DERMABOND ADVANCED (GAUZE/BANDAGES/DRESSINGS) ×1
DERMABOND ADVANCED .7 DNX12 (GAUZE/BANDAGES/DRESSINGS) ×1 IMPLANT
DEVICE DUBIN SPECIMEN MAMMOGRA (MISCELLANEOUS) ×2 IMPLANT
DRAPE LAPAROTOMY TRNSV 106X77 (MISCELLANEOUS) ×2 IMPLANT
ELECT CAUTERY BLADE 6.4 (BLADE) ×2 IMPLANT
ELECT REM PT RETURN 9FT ADLT (ELECTROSURGICAL) ×2
ELECTRODE REM PT RTRN 9FT ADLT (ELECTROSURGICAL) ×1 IMPLANT
GLOVE BIO SURGEON STRL SZ7 (GLOVE) ×2 IMPLANT
GLOVE INDICATOR 7.5 STRL GRN (GLOVE) ×4 IMPLANT
GOWN STRL REUS W/ TWL LRG LVL3 (GOWN DISPOSABLE) ×1 IMPLANT
GOWN STRL REUS W/ TWL XL LVL3 (GOWN DISPOSABLE) ×1 IMPLANT
GOWN STRL REUS W/TWL LRG LVL3 (GOWN DISPOSABLE) ×2
GOWN STRL REUS W/TWL XL LVL3 (GOWN DISPOSABLE) ×4
LABEL OR SOLS (LABEL) ×2 IMPLANT
MARGIN MAP 10MM (MISCELLANEOUS) ×2 IMPLANT
NEEDLE HYPO 25X1 1.5 SAFETY (NEEDLE) ×2 IMPLANT
PACK BASIN MINOR ARMC (MISCELLANEOUS) ×2 IMPLANT
SUT MNCRL 4-0 (SUTURE) ×2
SUT MNCRL 4-0 27XMFL (SUTURE) ×1
SUT SILK 2 0 SH (SUTURE) ×2 IMPLANT
SUT VIC AB 3-0 SH 27 (SUTURE) ×2
SUT VIC AB 3-0 SH 27X BRD (SUTURE) ×1 IMPLANT
SUTURE MNCRL 4-0 27XMF (SUTURE) ×1 IMPLANT
SYR 10ML LL (SYRINGE) ×2 IMPLANT
WATER STERILE IRR 1000ML POUR (IV SOLUTION) ×2 IMPLANT

## 2018-08-24 NOTE — Anesthesia Preprocedure Evaluation (Signed)
Anesthesia Evaluation  Patient identified by MRN, date of birth, ID band Patient awake    Reviewed: Allergy & Precautions, NPO status , Patient's Chart, lab work & pertinent test results, reviewed documented beta blocker date and time   History of Anesthesia Complications (+) Family history of anesthesia reaction  Airway Mallampati: II  TM Distance: >3 FB     Dental  (+) Chipped, Lower Dentures   Pulmonary pneumonia, resolved, COPD, Current Smoker,           Cardiovascular hypertension, Pt. on medications + Valvular Problems/Murmurs      Neuro/Psych Seizures -,  PSYCHIATRIC DISORDERS Anxiety Depression  Neuromuscular disease    GI/Hepatic PUD, GERD  Controlled,  Endo/Other    Renal/GU      Musculoskeletal  (+) Arthritis , Fibromyalgia -  Abdominal   Peds  Hematology  (+) anemia ,   Anesthesia Other Findings Pulmonary nodules. MJ use. EF 60.Neck movement ok. EKG ok.  Reproductive/Obstetrics                             Anesthesia Physical Anesthesia Plan  ASA: III  Anesthesia Plan: General   Post-op Pain Management:    Induction: Intravenous  PONV Risk Score and Plan:   Airway Management Planned: LMA  Additional Equipment:   Intra-op Plan:   Post-operative Plan:   Informed Consent: I have reviewed the patients History and Physical, chart, labs and discussed the procedure including the risks, benefits and alternatives for the proposed anesthesia with the patient or authorized representative who has indicated his/her understanding and acceptance.     Plan Discussed with: CRNA  Anesthesia Plan Comments:         Anesthesia Quick Evaluation

## 2018-08-24 NOTE — Transfer of Care (Signed)
Immediate Anesthesia Transfer of Care Note  Patient: Molly Peters  Procedure(s) Performed: BREAST LUMPECTOMY WITH ULTRASOUND IN OR (Right Breast)  Patient Location: PACU  Anesthesia Type:General  Level of Consciousness: awake  Airway & Oxygen Therapy: Patient Spontanous Breathing and Patient connected to face mask oxygen  Post-op Assessment: Report given to RN and Post -op Vital signs reviewed and stable  Post vital signs: Reviewed and stable  Last Vitals:  Vitals Value Taken Time  BP    Temp    Pulse    Resp    SpO2      Last Pain:  Vitals:   08/24/18 0817  TempSrc: Tympanic  PainSc: 0-No pain         Complications: No apparent anesthesia complications

## 2018-08-24 NOTE — Anesthesia Procedure Notes (Signed)
Procedure Name: LMA Insertion Date/Time: 08/24/2018 9:37 AM Performed by: Chanetta Marshall, CRNA Pre-anesthesia Checklist: Emergency Drugs available, Patient identified, Suction available and Patient being monitored Patient Re-evaluated:Patient Re-evaluated prior to induction Oxygen Delivery Method: Circle system utilized Preoxygenation: Pre-oxygenation with 100% oxygen Induction Type: IV induction Ventilation: Mask ventilation without difficulty LMA: LMA inserted LMA Size: 3.0 Number of attempts: 1 Placement Confirmation: positive ETCO2,  CO2 detector and breath sounds checked- equal and bilateral Tube secured with: Tape Dental Injury: Teeth and Oropharynx as per pre-operative assessment

## 2018-08-24 NOTE — Anesthesia Post-op Follow-up Note (Signed)
Anesthesia QCDR form completed.        

## 2018-08-24 NOTE — Op Note (Signed)
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 at 3 o'clock position (icd-10: D24.1)  PROCEDURE(S):  1.) Right breast lumpectomy with pre-incisional ultrasound localization (cpt: 19301)  INTRAOPERATIVE FINDINGS: Ultrasound-localized mammary tissue removed in its entirety including biopsy-associated coiled clip (as well as neighboring heart-shaped clip), confirmed within specimen intra-operatively by radiographic assessment  INTRAVENOUS FLUIDS: 600 mL crystalloid   ESTIMATED BLOOD LOSS: Minimal (<20 mL)  URINE OUTPUT: No Foley   SPECIMENS: Right breast lumpectomy, marked with long 3-0 silk medial suture and short 3-0 silk superior suture  IMPLANTS: None  DRAINS: None  COMPLICATIONS: None apparent  CONDITION AT END OF PROCEDURE: Hemodynamically stable and extubated  DISPOSITION OF PATIENT: PACU  INDICATIONS FOR PROCEDURE:  57 y.o. femalepresented for evaluation of abnormal Right breast diagnostic mammogram with focal breast ultrasound and abnormal core needle biopsy findings, for which excisional biopsy was advised. All risks, benefits, and alternatives to lumpectomy were discussed with the patient, all of patient's questions were answered to her expressed satisfaction, patient elected to proceed with lumpectomy, and informed consent was accordingly obtained and documented at that time.  DETAILS OF PROCEDURE: Patient was brought to the operating suite and appropriately identified. General anesthesia was administered along with appropriate pre-operative antibiotics, and LMA intubation was performed by anesthetist. Right breast  focal ultrasound was performed to localize the Right medial breast mass at 3 o'clock position, previously visualized likewise with ultrasound. In supine position, operative site was prepped and draped in the usual sterile fashion, and following a brief time out, local anesthetic was injected, and a 2 cm medial circumareolar incision was made using a #15 blade scalpel, around which appropriate thickness skin flaps were created and excision was continued deep to chest wall to include the visualized mass in its entirety with appropriately wide margins. While completing excision, the second biopsied mass at the 2:30 o'clock position was palpated along the margin of the excised specimen, and decision was made to incorporate this small palpable mass with the specimen. Long medial suture and short superior suture were used to orient the specimen, which was then handed off the field for radiographic assessment and pathology processing. Hemostasis was achieved, and the wound was re-approximated using buried interrupted 3-0 Vicryl for dermis.   Skin was then cleaned and dried, and sterile Dermabond skin glue was applied and allowed to dry. Patient was then safely able to be extubated, awakened, and transferred to PACU for post-operative monitoring and care.  I was present for all aspects of the above procedure, and no operative complications were apparent.

## 2018-08-24 NOTE — Anesthesia Postprocedure Evaluation (Signed)
Anesthesia Post Note  Patient: Molly Peters  Procedure(s) Performed: BREAST LUMPECTOMY WITH ULTRASOUND IN OR (Right Breast)  Patient location during evaluation: PACU Anesthesia Type: General Level of consciousness: awake and alert Pain management: pain level controlled Vital Signs Assessment: post-procedure vital signs reviewed and stable Respiratory status: spontaneous breathing, nonlabored ventilation, respiratory function stable and patient connected to nasal cannula oxygen Cardiovascular status: blood pressure returned to baseline and stable Postop Assessment: no apparent nausea or vomiting Anesthetic complications: no     Last Vitals:  Vitals:   08/24/18 1125 08/24/18 1139  BP: 105/85 (!) 131/92  Pulse: 77 76  Resp: (!) 29 19  Temp: 37.2 C 36.8 C  SpO2: 96%     Last Pain:  Vitals:   08/24/18 1139  TempSrc: Temporal  PainSc: 0-No pain                 Kamareon Sciandra S

## 2018-08-24 NOTE — Interval H&P Note (Signed)
History and Physical Interval Note:  08/24/2018 9:10 AM  Molly Peters  has presented today for surgery, with the diagnosis of RIGHT BREAST INTRADUCTAL PAPILLOMA  The various methods of treatment have been discussed with the patient and family. After consideration of risks, benefits and other options for treatment, the patient has consented to  Procedure(s): BREAST LUMPECTOMY WITH ULTRASOUND IN OR (Right) as a surgical intervention .  The patient's history has been reviewed, patient examined, no change in status, stable for surgery.  I have reviewed the patient's chart and labs.  Questions were answered to the patient's satisfaction.     Vickie Epley

## 2018-08-24 NOTE — Discharge Instructions (Addendum)
AMBULATORY SURGERY  DISCHARGE INSTRUCTIONS   1) The drugs that you were given will stay in your system until tomorrow so for the next 24 hours you should not:  A) Drive an automobile B) Make any legal decisions C) Drink any alcoholic beverage   2) You may resume regular meals tomorrow.  Today it is better to start with liquids and gradually work up to solid foods.  You may eat anything you prefer, but it is better to start with liquids, then soup and crackers, and gradually work up to solid foods.   3) Please notify your doctor immediately if you have any unusual bleeding, trouble breathing, redness and pain at the surgery site, drainage, fever, or pain not relieved by medication.    4) Additional Instructions:        Please contact your physician with any problems or Same Day Surgery at [phone], Monday through Friday 6 am to 4 pm, or [place] at [facility] number at [phone].In addition to included general post-operative instructions for Right Breast Lumpectomy,  Diet: Resume home heart healthy diet.   Activity: No heavy lifting >20 pounds (children, pets, laundry, garbage) or strenuous activity until follow-up, but light activity and walking are encouraged. Do not drive or drink alcohol if taking narcotic pain medications.  Wound care: 2 days after surgery (Friday, 10/18), you may shower/get incision wet with soapy water and pat dry (do not rub incisions), but no baths or submerging incision underwater until follow-up.   Medications: Resume all home medications. For mild to moderate pain: acetaminophen (Tylenol) or ibuprofen/naproxen (if no kidney disease). Combining Tylenol with alcohol can substantially increase your risk of causing liver disease. Narcotic pain medications, if prescribed, can be used for severe pain, though may cause nausea, constipation, and drowsiness. Do not combine Tylenol and Percocet (or similar) within a 6 hour period as Percocet (and  similar) contain(s) Tylenol. If you do not need the narcotic pain medication, you do not need to fill the prescription.  Call office [phone]) at any time if any questions, worsening pain, fevers/chills, bleeding, drainage from incision site, or other concerns.

## 2018-08-25 ENCOUNTER — Encounter: Payer: Self-pay | Admitting: Surgery

## 2018-08-26 LAB — SURGICAL PATHOLOGY

## 2018-08-29 ENCOUNTER — Telehealth: Payer: Self-pay | Admitting: *Deleted

## 2018-08-29 ENCOUNTER — Inpatient Hospital Stay: Payer: Medicare HMO

## 2018-08-29 VITALS — BP 138/88 | HR 81 | Temp 98.5°F | Resp 18

## 2018-08-29 DIAGNOSIS — D500 Iron deficiency anemia secondary to blood loss (chronic): Secondary | ICD-10-CM | POA: Diagnosis not present

## 2018-08-29 DIAGNOSIS — E538 Deficiency of other specified B group vitamins: Secondary | ICD-10-CM | POA: Diagnosis not present

## 2018-08-29 MED ORDER — IRON SUCROSE 20 MG/ML IV SOLN
200.0000 mg | Freq: Once | INTRAVENOUS | Status: AC
  Administered 2018-08-29: 200 mg via INTRAVENOUS
  Filled 2018-08-29: qty 10

## 2018-08-29 MED ORDER — CYANOCOBALAMIN 1000 MCG/ML IJ SOLN
1000.0000 ug | Freq: Once | INTRAMUSCULAR | Status: AC
  Administered 2018-08-29: 1000 ug via INTRAMUSCULAR
  Filled 2018-08-29: qty 1

## 2018-08-29 MED ORDER — SODIUM CHLORIDE 0.9 % IV SOLN
INTRAVENOUS | Status: DC
  Administered 2018-08-29: 14:00:00 via INTRAVENOUS
  Filled 2018-08-29: qty 250

## 2018-08-29 NOTE — Telephone Encounter (Signed)
Advised patient to take Tylenol. Patient agrees.

## 2018-08-29 NOTE — Telephone Encounter (Signed)
Patient called stated that she was given Percocet and she is not suppose to be taking anything with aspirin in it. She stated that it is messing up her ulcers. Please call and advise

## 2018-08-29 NOTE — Progress Notes (Signed)
Patient due B12 per Rodena Piety B. RN/Dr. Mike Gip

## 2018-09-05 ENCOUNTER — Other Ambulatory Visit: Payer: Self-pay | Admitting: Internal Medicine

## 2018-09-05 ENCOUNTER — Other Ambulatory Visit: Payer: Self-pay | Admitting: Gastroenterology

## 2018-09-05 NOTE — Telephone Encounter (Signed)
Last written 05-02-18 #90/2 refills.  Last refill 08-02-18  Last OV 06-20-18 Next OV 11-08-18

## 2018-09-08 ENCOUNTER — Other Ambulatory Visit: Payer: Medicare HMO

## 2018-09-08 ENCOUNTER — Ambulatory Visit: Payer: Medicare HMO

## 2018-09-08 ENCOUNTER — Ambulatory Visit: Payer: Medicare HMO | Admitting: Hematology and Oncology

## 2018-09-12 ENCOUNTER — Inpatient Hospital Stay: Payer: Medicare HMO

## 2018-09-12 ENCOUNTER — Inpatient Hospital Stay: Payer: Medicare HMO | Attending: Hematology and Oncology

## 2018-09-12 ENCOUNTER — Inpatient Hospital Stay (HOSPITAL_BASED_OUTPATIENT_CLINIC_OR_DEPARTMENT_OTHER): Payer: Medicare HMO | Admitting: Hematology and Oncology

## 2018-09-12 ENCOUNTER — Telehealth: Payer: Self-pay | Admitting: *Deleted

## 2018-09-12 ENCOUNTER — Other Ambulatory Visit: Payer: Self-pay

## 2018-09-12 ENCOUNTER — Encounter: Payer: Self-pay | Admitting: Hematology and Oncology

## 2018-09-12 VITALS — BP 151/90 | HR 71 | Temp 97.3°F | Resp 18 | Ht 60.0 in | Wt 92.6 lb

## 2018-09-12 DIAGNOSIS — Z79899 Other long term (current) drug therapy: Secondary | ICD-10-CM | POA: Insufficient documentation

## 2018-09-12 DIAGNOSIS — D241 Benign neoplasm of right breast: Secondary | ICD-10-CM

## 2018-09-12 DIAGNOSIS — Z8541 Personal history of malignant neoplasm of cervix uteri: Secondary | ICD-10-CM | POA: Diagnosis not present

## 2018-09-12 DIAGNOSIS — E538 Deficiency of other specified B group vitamins: Secondary | ICD-10-CM

## 2018-09-12 DIAGNOSIS — R918 Other nonspecific abnormal finding of lung field: Secondary | ICD-10-CM

## 2018-09-12 DIAGNOSIS — F17210 Nicotine dependence, cigarettes, uncomplicated: Secondary | ICD-10-CM | POA: Insufficient documentation

## 2018-09-12 DIAGNOSIS — D509 Iron deficiency anemia, unspecified: Secondary | ICD-10-CM | POA: Diagnosis not present

## 2018-09-12 DIAGNOSIS — D500 Iron deficiency anemia secondary to blood loss (chronic): Secondary | ICD-10-CM

## 2018-09-12 LAB — CBC WITH DIFFERENTIAL/PLATELET
Abs Immature Granulocytes: 0.01 10*3/uL (ref 0.00–0.07)
Basophils Absolute: 0.1 10*3/uL (ref 0.0–0.1)
Basophils Relative: 1 %
Eosinophils Absolute: 0.1 10*3/uL (ref 0.0–0.5)
Eosinophils Relative: 1 %
HCT: 33.3 % — ABNORMAL LOW (ref 36.0–46.0)
Hemoglobin: 10.9 g/dL — ABNORMAL LOW (ref 12.0–15.0)
Immature Granulocytes: 0 %
Lymphocytes Relative: 47 %
Lymphs Abs: 3.1 10*3/uL (ref 0.7–4.0)
MCH: 27 pg (ref 26.0–34.0)
MCHC: 32.7 g/dL (ref 30.0–36.0)
MCV: 82.6 fL (ref 80.0–100.0)
Monocytes Absolute: 0.4 10*3/uL (ref 0.1–1.0)
Monocytes Relative: 6 %
Neutro Abs: 3 10*3/uL (ref 1.7–7.7)
Neutrophils Relative %: 45 %
Platelets: 312 10*3/uL (ref 150–400)
RBC: 4.03 MIL/uL (ref 3.87–5.11)
RDW: 22.5 % — ABNORMAL HIGH (ref 11.5–15.5)
WBC: 6.7 10*3/uL (ref 4.0–10.5)
nRBC: 0 % (ref 0.0–0.2)

## 2018-09-12 LAB — FOLATE: Folate: 9.2 ng/mL (ref >5.9)

## 2018-09-12 LAB — VITAMIN B12: Vitamin B-12: 421 pg/mL (ref 180–914)

## 2018-09-12 LAB — FERRITIN: Ferritin: 81 ng/mL (ref 11–307)

## 2018-09-12 NOTE — Telephone Encounter (Signed)
Called patient to inform her that her ferritin has improved from 5 to 81.

## 2018-09-12 NOTE — Progress Notes (Signed)
Patient c/o increase pain noted to lower back

## 2018-09-12 NOTE — Telephone Encounter (Signed)
-----   Message from Lequita Asal, MD sent at 09/12/2018  3:05 PM EST ----- Regarding: Please call patient  Ferritin looks good.  M  ----- Message ----- From: Buel Ream, [facility] [place] Sent: 09/12/2018  12:52 PM EST To: Lequita Asal, MD

## 2018-09-12 NOTE — Progress Notes (Signed)
[facility] day:  09/12/2018   Chief Complaint: Molly Peters is a 57 y.o. female with iron deficiency anemia, B12 deficiency, and intra-ductal papilloma who is seen for assessment after interval IV iron and lumpectomy.  HPI:  The patient was last seen in the medical oncology office on 08/08/2018. At that time, she described a 2 day history of significant rectal bleeding.  She had significant lower abdominal pain.  She was referred to the ER.  Stool was guaiac positive.  CT angiogram of the abdomen and pelvis revealed no evidence of abdominal aortic aneurysm or dissection. No findings to suggest active GI bleeding on CT. There was non-vascular wall thickening involving the transverse colon extending to the splenic flexure, suspicious for infectious/inflammatory colitis.  There was associated trace pelvic ascites. There was no pneumatosis or free air.  Patient refused admission to the hospital.  Hemoglobin dropped from 10.8 to 9.6.  She received Zosyn.  She received Flagyl and Augmentin.  She saw Dr. Bonna Gains on 08/10/2018.  She denied any further hematochezia. Cinically, she was improving.  Stool for C diff and and GI panel by PCR was negative.  Repeat colonoscopy was planned in 6-12 months.  She received weekly Venofer x 3 (08/15/2018 - 08/29/2018).  She received B12 IM on 08/29/2018.  She underwent right lumpectomy for an intra-ductal papilloma on 08/24/2018 by Dr. Tama High. Pathology revealed an intraductal papilloma with adjacent biopsy change.  There was sclerotic changes suggestive of cyst/duct wall with adjacent biopsy change.  There was duct ectasia and cysts in the tissue between the 2 biopsy sites.  There was no atypia or malignancy.   During the interim, patient is doing well. She denies any acute concerns. Patient notes that her abdominal pain has improved. Bowel movements are soft. Patient denies bleeding; no hematochezia, melena, or  gross hematuria. Patient denies that she has experienced any B symptoms. She denies any interval infections.   Patient advises that she maintains an adequate appetite. She is eating well. Patient maintains a diet rich in iron. She indicates that she eats meat and green leafy vegetables on a consistent basis. Weight today is 92 lb 9.6 oz (42 kg), which compared to her last visit to the clinic, represents a 2 pound increase.     Patient complains of pain, rated 7/10 in her back,  in the clinic today   Past Medical History:  Diagnosis Date  . Anxiety   . Cervical cancer ([place]) 1980's  . Chronic back pain 11/12/2009   Qualifier: Diagnosis of  By: Maxie Better FNP, Rosalita Levan   . COPD (chronic obstructive pulmonary disease) (HCC)    states SOB with ADLs; no O2 use; able to speak in complete sentences without SOB(11/15/2014)  . Depression   . Difficulty swallowing pills    s/p cervical fusion  . Family history of adverse reaction to anesthesia    pt's mother and sister have hx. of post-op N/V  . Fibromyalgia   . GERD (gastroesophageal reflux disease)   . Heart murmur   . History of gastric ulcer   . Hypertension    states under control with med., has been on med. x 1-2 yr.  . IBS (irritable bowel syndrome)   . Internal hemorrhoid    states has had intermittent bright red bleeding with BM (11/15/2014)  . Limited joint range of motion    neck - s/p cervical fusion  . Localized primary osteoarthritis of right shoulder region  11/23/2014  . Osteoarthritis of left shoulder 07/05/2015  . Osteoarthritis of right shoulder 11/2014  . Pneumonia   . Seizures ([place])    last seizure 2010  . Short-term memory loss   . TMJ syndrome   . Valvular heart disease    Initial workup a number of years ago at [facility].  Echo (10/2009) showed EF 60-65%,      normal LV size, moderate aortic insufficiency with a trileaflet aortic valve and normal aortic root size.  Mild      mitral regurgitation.   . Wears dentures     lower    Past Surgical History:  Procedure Laterality Date  . ABDOMINAL HYSTERECTOMY     partial  . ANTERIOR CERVICAL DECOMP/DISCECTOMY FUSION  02/06/2011   C4-5, C5-6, C6-7  . BREAST BIOPSY Bilateral 10+ yrs ago   neg  . BREAST BIOPSY Right 08/03/2018   2 areas bx, path pending  . BREAST LUMPECTOMY Bilateral 1989   x 3 - benign  . BREAST LUMPECTOMY Right 08/24/2018   Procedure: BREAST LUMPECTOMY WITH ULTRASOUND IN OR;  Surgeon: Vickie Epley, MD;  Location: [facility] ORS;  Service: General;  Laterality: Right;  . CARDIAC CATHETERIZATION  1995  . CESAREAN SECTION     x 2  . COLONOSCOPY  09/28/2008  . COLONOSCOPY N/A 06/15/2018   Procedure: COLONOSCOPY;  Surgeon: Virgel Manifold, MD;  Location: [facility] ENDOSCOPY;  Service: Endoscopy;  Laterality: N/A;  . ESOPHAGOGASTRODUODENOSCOPY  09/28/2008  . ESOPHAGOGASTRODUODENOSCOPY N/A 06/14/2018   Procedure: ESOPHAGOGASTRODUODENOSCOPY (EGD);  Surgeon: Virgel Manifold, MD;  Location: [facility] ENDOSCOPY;  Service: Endoscopy;  Laterality: N/A;  . HARDWARE REMOVAL  11/17/2006   L5-S1  . LAMINECTOMY WITH POSTERIOR LATERAL ARTHRODESIS LEVEL 3  12/06/2009   with synovial cyst resection L3-4 bilat.  Marland Kitchen LAMINECTOMY WITH POSTERIOR LATERAL ARTHRODESIS LEVEL 3  11/17/2006   L4-5  . NM MYOVIEW LTD     Lexiscan myoview (10/2009): EF 77%, normal wall motion, normal perfusion.   Marland Kitchen SHOULDER ARTHROSCOPY WITH DEBRIDEMENT AND BICEP TENDON REPAIR Right 04/13/2014   Procedure: RIGHT SHOULDER ARTHROSCOPY WITH DEBRIDEMENT EXTENSIVE;  Surgeon: Johnny Bridge, MD;  Location: [place];  Service: Orthopedics;  Laterality: Right;  . TOTAL SHOULDER ARTHROPLASTY Right 11/23/2014   Procedure: TOTAL RIGHT SHOULDER ARTHROPLASTY;  Surgeon: Johnny Bridge, MD;  Location: [place];  Service: Orthopedics;  Laterality: Right;  . TOTAL SHOULDER ARTHROPLASTY Left 07/05/2015   Procedure: LEFT TOTAL SHOULDER REPLACEMENT;  Surgeon: Marchia Bond, MD;   Location: [place];  Service: Orthopedics;  Laterality: Left;    Family History  Problem Relation Age of Onset  . Cervical cancer Mother   . Anesthesia problems Mother        post-op N/V  . Rheum arthritis Mother   . Anxiety disorder Mother   . Cancer Father        colon cancer  . Migraines Sister   . Cervical cancer Sister   . Anesthesia problems Sister        post-op N/V  . Migraines Brother   . Asthma Daughter   . Cerebral palsy Daughter        age 11  . Coronary artery disease Maternal Grandmother   . Hypertension Maternal Grandmother   . Diabetes Maternal Grandmother   . Coronary artery disease Paternal Grandmother   . Hypertension Paternal Grandmother   . Diabetes Paternal Grandmother   . Breast cancer Maternal Aunt   . Breast cancer  Maternal Aunt   . Breast cancer Maternal Aunt     Social History:  reports that she has been smoking cigarettes. She has a 46.00 pack-year smoking history. She has never used smokeless tobacco. She reports that she drinks alcohol. She reports that she has current or past drug history. Drug: Marijuana.  Patient smoked 1.5 packs of cigarettes per day for 40+ years. She drinks "a hair" every now and then. Patient has been on disability in 2005 for "ruptured back". She is a former [employer].  Allergies:  Allergies  Allergen Reactions  . Carbamazepine Hives  . Divalproex Sodium Swelling and Other (See Comments)    HAIR FALLS OUT  . Clonazepam Other (See Comments)    HALLUCINATIONS  . Diphenhydramine Hcl Rash  . Tiotropium Bromide Monohydrate Other (See Comments)    CREATES YEAST IN THROAT  . Vicodin [Hydrocodone-Acetaminophen] Itching    Current Medications: Current Outpatient Medications  Medication Sig Dispense Refill  . Cyanocobalamin (VITAMIN B-12 PO) Take 1 tablet by mouth daily.    Marland Kitchen gabapentin (NEURONTIN) 300 MG capsule Take 4 capsules (1,200 mg total) by mouth at bedtime.    . lamoTRIgine (LAMICTAL) 100 MG tablet  TAKE 1 TABLET TWICE DAILY (Patient taking differently: Take 100 mg by mouth 2 (two) times daily. ) 180 tablet 1  . losartan-hydrochlorothiazide (HYZAAR) 100-12.5 MG tablet TAKE 1 TABLET EVERY DAY (Patient taking differently: Take 1 tablet by mouth daily. ) 90 tablet 1  . pantoprazole (PROTONIX) 40 MG tablet Take 1 tablet (40 mg total) by mouth 2 (two) times daily. 180 tablet 0  . PARoxetine (PAXIL) 20 MG tablet TAKE 1 TABLET EVERY MORNING (Patient taking differently: Take 20 mg by mouth daily. ) 90 tablet 3  . ALPRAZolam (XANAX) 1 MG tablet TAKE 1 TABLET THREE TIMES DAILY AS NEEDED FOR ANXIETY (Patient not taking: Reported on 09/12/2018) 90 tablet 2  . nicotine (NICODERM CQ - DOSED IN MG/24 HOURS) 14 mg/24hr patch Place 1 patch (14 mg total) onto the skin daily. (Patient not taking: Reported on 08/17/2018) 28 patch 0  . ondansetron (ZOFRAN ODT) 4 MG disintegrating tablet Take 1 tablet (4 mg total) by mouth every 8 (eight) hours as needed for nausea or vomiting. (Patient not taking: Reported on 09/12/2018) 20 tablet 0  . oxyCODONE-acetaminophen (PERCOCET/ROXICET) 5-325 MG tablet Take 1 tablet by mouth every 4 (four) hours as needed for severe pain. (Patient not taking: Reported on 09/12/2018) 15 tablet 0   No current facility-administered medications for this visit.     Review of Systems  Constitutional: Positive for malaise/fatigue. Negative for chills, diaphoresis, fever and weight loss (up 2 pounds).  HENT: Negative.  Negative for congestion, ear discharge, ear pain, nosebleeds, sinus pain, sore throat and tinnitus.   Eyes: Negative.  Negative for double vision, photophobia, pain, discharge and redness.  Respiratory: Positive for shortness of breath (exertional; improved some). Negative for cough, hemoptysis and sputum production.   Cardiovascular: Negative.  Negative for chest pain, palpitations, orthopnea, leg swelling and PND.  Gastrointestinal: Negative for abdominal pain (improved), blood in  stool, constipation, diarrhea, melena, nausea and vomiting.       Stools soft.  Genitourinary: Negative for dysuria, frequency, hematuria and urgency.  Musculoskeletal: Positive for joint pain and neck pain. Negative for back pain, falls and myalgias.  Skin: Negative for itching and rash.  Neurological: Negative for dizziness, tingling, tremors, sensory change, speech change, focal weakness, weakness and headaches.  Endo/Heme/Allergies: Does not bruise/bleed easily.  Bruised s/p lumpectomy.  Psychiatric/Behavioral: Negative for depression and memory loss. The patient is not nervous/anxious and does not have insomnia.   All other systems reviewed and are negative.  Performance status (ECOG): 1 - Symptomatic but completely ambulatory  Vital Signs: BP (!) 151/90 (BP Location: Left Arm, Patient Position: Sitting)   Pulse 71   Temp (!) 97.3 F (36.3 C) (Tympanic)   Resp 18   Ht 5' (1.524 m)   Wt 92 lb 9.6 oz (42 kg)   SpO2 98%   BMI 18.08 kg/m   Physical Exam  Constitutional: She is oriented to person, place, and time and well-developed, well-nourished, and in no distress. No distress.  HENT:  Head: Normocephalic and atraumatic.  Mouth/Throat: Oropharynx is clear and moist and mucous membranes are normal. No oropharyngeal exudate.  Long gray hair.  Eyes: Pupils are equal, round, and reactive to light. Conjunctivae and EOM are normal. No scleral icterus.  Glasses.  Blue eyes.  Neck: Normal range of motion. Neck supple. No JVD present.  Cardiovascular: Normal rate, regular rhythm, normal heart sounds and intact distal pulses. Exam reveals no gallop and no friction rub.  No murmur heard. Pulmonary/Chest: Breath sounds normal. No respiratory distress. She has no wheezes. She has no rales. Breast asymmetry: right breast s/p lumpectomy.  Abdominal: Soft. Bowel sounds are normal. She exhibits no distension and no mass. There is no tenderness. There is no rebound and no guarding.   Musculoskeletal: Normal range of motion. She exhibits no edema or tenderness.  Lymphadenopathy:    She has no cervical adenopathy.    She has no axillary adenopathy.       Right: No inguinal and no supraclavicular adenopathy present.       Left: No inguinal and no supraclavicular adenopathy present.  Neurological: She is alert and oriented to person, place, and time. Gait normal.  Skin: Skin is warm and dry. No rash noted. She is not diaphoretic. No erythema.  Psychiatric: Mood, affect and judgment normal.  Nursing note and vitals reviewed.   Orders Only on 09/12/2018  Component Date Value Ref Range Status  . WBC 09/12/2018 6.7  4.0 - 10.5 K/uL Final  . RBC 09/12/2018 4.03  3.87 - 5.11 MIL/uL Final  . Hemoglobin 09/12/2018 10.9* 12.0 - 15.0 g/dL Final  . HCT 09/12/2018 33.3* 36.0 - 46.0 % Final  . MCV 09/12/2018 82.6  80.0 - 100.0 fL Final  . MCH 09/12/2018 27.0  26.0 - 34.0 pg Final  . MCHC 09/12/2018 32.7  30.0 - 36.0 g/dL Final  . RDW 09/12/2018 22.5* 11.5 - 15.5 % Final  . Platelets 09/12/2018 312  150 - 400 K/uL Final  . nRBC 09/12/2018 0.0  0.0 - 0.2 % Final  . Neutrophils Relative % 09/12/2018 45  % Final  . Neutro Abs 09/12/2018 3.0  1.7 - 7.7 K/uL Final  . Lymphocytes Relative 09/12/2018 47  % Final  . Lymphs Abs 09/12/2018 3.1  0.7 - 4.0 K/uL Final  . Monocytes Relative 09/12/2018 6  % Final  . Monocytes Absolute 09/12/2018 0.4  0.1 - 1.0 K/uL Final  . Eosinophils Relative 09/12/2018 1  % Final  . Eosinophils Absolute 09/12/2018 0.1  0.0 - 0.5 K/uL Final  . Basophils Relative 09/12/2018 1  % Final  . Basophils Absolute 09/12/2018 0.1  0.0 - 0.1 K/uL Final  . Immature Granulocytes 09/12/2018 0  % Final  . Abs Immature Granulocytes 09/12/2018 0.01  0.00 - 0.07 K/uL Final  Performed at [facility], [address] [place], [place] [address]    Assessment:  ROSALVA NEARY is a 57 y.o. female with iron deficiency anemia secondary to an upper GI bleed.  She  presented with intermittent melena and chest pain.  Hemoglobin nadir was 6.9 on 06/14/2018.  Ferritin was 9, iron saturation 11% and TIBC 393 on 06/20/2018.  She received 1 unit of PRBCs.  Work-up on 07/05/2018 revealed a hematocrit of 31, hemoglobin 10.2, and MCV 70.8.  Ferritin was 7.  B12 was 165 (low).  Folate was 6.3 (> 5.9).  TSH was 1.256.  Ferritin has been followed: 9 on 06/20/2018, 7 on 07/05/2018, 5 on 07/19/2018, and 81 on 09/12/2018.   She received Venofer 200 mg on 07/25/2018 and weekly x 3 (08/15/2018 - 08/29/2018)..  She has B12 deficiency.  She began oral B12 on 07/06/2018.  B12 was 165 on 07/05/2018 and 396 on 07/19/2018.  Folate was 6.3 on 07/05/2018. She began monthly B12 on 08/29/2018.  EGD on 06/14/2018 revealed esophageal mucosal changes c/w short segment of Barrett's and a non-bleeding gastric ulcers.  Pathology at the GE junction revealed squamocolumnar mucosa with moderate chronic inflammation.  Gastric biopsy revealed no H pylori, dysplasia, or malignancy.    Colonoscopy on 06/15/2018 revealed a 5 mm polyp in the sigmoid colon (tubular adenoma).  Repeat colonoscopy was discussed in 6-12 months due to fair prep.    Chest CT angiogram on 06/22/2018 reveled no pulmonary embolism.  There was underlying emphysematous changes.  There were small LUL pulmonary nodular changes (largest 3 mm).  There was no thoracic adenopathy.    CT angiogram of the abdomen and pelvis revealed no evidence of abdominal aortic aneurysm or dissection. No findings to suggest active GI bleeding on CT. There was non-vascular wall thickening involving the transverse colon extending to the splenic flexure, suspicious for infectious/inflammatory colitis.  There was associated trace pelvic ascites. There was no pneumatosis or free air.  C diff and GI panel by PCR was negative.  She was treated with Flagyl and Augmentin.  BILATERAL mammogram on 07/27/2018 revealed 2 indeterminate masses in the RIGHT breast  at the 2:30 and 3 o'clock positions. Follow up breast ultrasounds shows a 6 x 4 x 5 mm oval hypoechoic mass, 1 cm from the nipple, in the 2:30 position. In the 3 o'clock position, again 1 cm from the nipple, the was a 6 x 3 x 3 mm oval hypoechoic mass. LEFT breast ultrasound showed normal fibroglandular tissue at the 10 o'clock position. Ultrasound guided needle biopsy with clip placement on 08/03/2018. Pathology revealed revealed a dilated duct/cyst with sclerosis of the wall in the 2:30 lesion. Sample negative for atypia and malignancy. Lesion in the 3 o'clock position revealed an intraductal papilloma. Sample negative for atypia and malignancy.  Right breast lumpectomy on 08/24/2018 revealed an intraductal papilloma with adjacent biopsy change.  There was sclerotic changes suggestive of cyst/duct wall with adjacent biopsy change.  There was duct ectasia and cysts in the tissue between the 2 biopsy sites.  There was no atypia or malignancy.  Symptomatically, patient is doing well overall. She notes that her abdominal pain has improved. No further rectal bleeding noted. Continues to have exertional dyspnea, however notes improvement. Exam is stable.  WBC 6700 ([place] 3000).  Hemoglobin 10.9, hematocrit 33.3, and platelets 312,000.  Ferritin is 81.  Plan: 1. RIGHT breast intraductal papilloma  Reviewed interval lumpectomy for an intra-ductal papilloma on 08/24/2018 by Dr. Tama High.  Pathology revealed an intraductal papilloma with adjacent biopsy change. No atypia or malignancy.   Continue to follow up with surgery as already scheduled.  2. Iron deficiency anemia  Labs reviewed.  Hemoglobin 10.9 and hematocrit 33.3  No microcytosis or hypochromia.  Ferritin stable at 81 ng/mL.  Continue routine lab monitoring. 3. B12 deficiency  B12 level normal at 421 pg/mL.  Folate 9.2 ng/mL.  Received B12 injection on 08/29/2018.  Continue monthly parenteral B12 supplementation. 4. Pulmonary  nodules  Follow-up CT imaging due to on 06/23/2019 5. Rectal bleeding  Resolved at this point.  Colonoscopy done on 06/15/2018 - colon prep was sub-optimal.   Followed by gastroenterology Rose Phi, MD).  Plans are for repeat colonoscopy in 6 to 12 months. 6. RTC on 10/24/2018 for MD assessment, labs (CBC with diff, ferritin - day before), B12 injection, and +/- Venofer.    Honor Loh, NP  09/12/2018, 1:51 PM   I saw and evaluated the patient, participating in the key portions of the service and reviewing pertinent diagnostic studies and records.  I reviewed the nurse practitioner's note and agree with the findings and the plan.  The assessment and plan were discussed with the patient. Multiple questions were asked by the patient and answered.   Nolon Stalls, MD 09/12/2018, 1:51 PM

## 2018-09-13 ENCOUNTER — Encounter: Payer: Self-pay | Admitting: Surgery

## 2018-09-13 ENCOUNTER — Ambulatory Visit (INDEPENDENT_AMBULATORY_CARE_PROVIDER_SITE_OTHER): Payer: Medicare HMO | Admitting: Surgery

## 2018-09-13 ENCOUNTER — Other Ambulatory Visit: Payer: Self-pay

## 2018-09-13 VITALS — BP 151/92 | HR 60 | Temp 97.9°F | Ht 60.0 in | Wt 92.4 lb

## 2018-09-13 DIAGNOSIS — D241 Benign neoplasm of right breast: Secondary | ICD-10-CM

## 2018-09-13 NOTE — Patient Instructions (Signed)
Return as needed.The patient is aware to call back for any questions or concerns.  

## 2018-09-13 NOTE — Progress Notes (Signed)
Surgical Clinic Progress/Follow-up Note   HPI:  57 y.o. Female presents to clinic for post-op follow-up 20 Days s/p Right breast lumpectomy with intra-operative pre-incisional ultrasound localization Molly Peters, 08/24/2018). Patient reports she's been feeling well and denies any peri-incisional pain, nipple discharge, N/V, fever/chills, CP, or SOB. Patient's only complaint is back pain, for which she last saw neurosurgeon 5 years ago.  Review of Systems:  Constitutional: denies fever/chills  Respiratory: denies shortness of breath, wheezing  Cardiovascular: denies chest pain, palpitations  Breasts: peri-incisional pain and nipple discharge as per interval history Gastrointestinal: denies abdominal pain, N/V, or diarrhea Skin: Denies any other rashes or skin discolorations except post-surgical wounds as per interval history  Vital Signs:  BP (!) 151/92   Pulse 60   Temp 97.9 F (36.6 C) (Temporal)   Ht 5' (1.524 m)   Wt 92 lb 6.4 oz (41.9 kg)   SpO2 96%   BMI 18.05 kg/m    Physical Exam:  Constitutional:  -- Normal body habitus  -- Awake, alert, and oriented x3  Pulmonary:  -- No crackles -- Equal breath sounds bilaterally -- Breathing non-labored at rest Cardiovascular:  -- S1, S2 present  -- No pericardial rubs  Breast: -- Post-surgical incision well-approximated without any peri-incisional erythema or drainage -- No appreciated breast mass or nipple discharge, no axillary lymphadenopathy  Musculoskeletal / Integumentary:  -- Wounds or skin discoloration: None appreciated except post-surgical incisions as described above (Breast) -- Extremities: B/L UE and LE FROM, hands and feet warm, no edema   Assessment:  57 y.o. yo Female with a problem list including...  Patient Active Problem List   Diagnosis Date Noted  . Pulmonary nodules 08/08/2018  . Intraductal papilloma of breast, right 08/08/2018  . B12 deficiency 07/21/2018  . Weight loss 07/21/2018  . Iron deficiency  anemia due to chronic blood loss 07/05/2018  . Microcytic anemia   . Polyp of sigmoid colon   . Columnar epithelial-lined lower esophagus   . Acute peptic ulcer of stomach   . Acute posthemorrhagic anemia   . GIB (gastrointestinal bleeding) 06/13/2018  . Hoarseness 10/27/2017  . Advance directive discussed with patient 10/27/2017  . Neuropathy 08/11/2016  . Vitamin D deficiency 03/15/2016  . 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 Hx of cervical spine surgery (Left ACDF) 02/02/2016  . Chronic pain 01/29/2016  . Marijuana use 01/29/2016  . Osteoarthritis of shoulder (Left) 07/05/2015  . S/P Bilateral Total Shoulder Replacement (2016) 11/23/2014  . Osteoarthritis of shoulder (Right) 04/13/2014  . Routine general medical examination at a health care facility 04/05/2013  . CAROTID BRUIT, LEFT 01/02/2011  . Restless legs syndrome (RLS) 10/21/2010  . Essential hypertension, benign 06/23/2010  . HYPERLIPIDEMIA-MIXED 10/23/2009  . Valvular heart disease 03/27/2009  . IRRITABLE BOWEL SYNDROME 11/13/2008  . Mood disorder ([place]) 09/07/2007  . Tobacco use disorder 09/07/2007  . GLAUCOMA 09/07/2007  . HEMORRHOIDS 09/07/2007  . GASTRIC ULCER 09/07/2007  . COPD (chronic obstructive pulmonary disease) with chronic bronchitis ([place]) 08/08/2007  . GERD 08/08/2007  . Seizure disorder ([place]) 08/08/2007    presents to clinic for post-op follow-up evaluation, doing overall well 20 Days s/p Right breast lumpectomy with intra-operative pre-incisional ultrasound localization Molly Peters, 08/24/2018) for non-central Right medial breast intraductal papilloma.  Plan:              - pathology results discussed with patient              - okay  to submerge incisions under water (baths, swimming) prn             - gradually resume all activities without restrictions over next 2 weeks             - apply sunblock particularly to incisions with sun exposure to reduce  pigmentation of scars             - return to clinic as needed, instructed to call office if any questions or concerns  - discussed with patient follow-up with neurosurgeon for back pain  - smoking cessation strongly encouraged  All of the above recommendations were discussed with the patient, and all of patient's questions were answered to her expressed satisfaction.  -- Molly Peters Molly Hoes, MD, [place]: [facility] - Partnering for exceptional care. Office: [phone]

## 2018-09-26 ENCOUNTER — Inpatient Hospital Stay: Payer: Medicare HMO

## 2018-09-28 ENCOUNTER — Ambulatory Visit: Payer: Medicare HMO | Admitting: Gastroenterology

## 2018-10-21 ENCOUNTER — Inpatient Hospital Stay: Payer: Medicare HMO | Attending: Hematology and Oncology

## 2018-10-21 ENCOUNTER — Other Ambulatory Visit: Payer: Self-pay

## 2018-10-21 DIAGNOSIS — D241 Benign neoplasm of right breast: Secondary | ICD-10-CM | POA: Insufficient documentation

## 2018-10-21 DIAGNOSIS — E538 Deficiency of other specified B group vitamins: Secondary | ICD-10-CM | POA: Insufficient documentation

## 2018-10-21 DIAGNOSIS — D508 Other iron deficiency anemias: Secondary | ICD-10-CM | POA: Insufficient documentation

## 2018-10-21 DIAGNOSIS — R918 Other nonspecific abnormal finding of lung field: Secondary | ICD-10-CM | POA: Insufficient documentation

## 2018-10-21 DIAGNOSIS — Z8719 Personal history of other diseases of the digestive system: Secondary | ICD-10-CM | POA: Insufficient documentation

## 2018-10-24 ENCOUNTER — Inpatient Hospital Stay: Payer: Medicare HMO

## 2018-10-24 ENCOUNTER — Encounter: Payer: Self-pay | Admitting: Hematology and Oncology

## 2018-10-24 ENCOUNTER — Other Ambulatory Visit: Payer: Self-pay

## 2018-10-24 ENCOUNTER — Inpatient Hospital Stay (HOSPITAL_BASED_OUTPATIENT_CLINIC_OR_DEPARTMENT_OTHER): Payer: Medicare HMO | Admitting: Hematology and Oncology

## 2018-10-24 VITALS — BP 149/82 | HR 69 | Temp 98.2°F | Resp 16 | Wt 97.6 lb

## 2018-10-24 DIAGNOSIS — Z8719 Personal history of other diseases of the digestive system: Secondary | ICD-10-CM | POA: Diagnosis not present

## 2018-10-24 DIAGNOSIS — E538 Deficiency of other specified B group vitamins: Secondary | ICD-10-CM

## 2018-10-24 DIAGNOSIS — D241 Benign neoplasm of right breast: Secondary | ICD-10-CM | POA: Diagnosis not present

## 2018-10-24 DIAGNOSIS — R918 Other nonspecific abnormal finding of lung field: Secondary | ICD-10-CM

## 2018-10-24 DIAGNOSIS — D508 Other iron deficiency anemias: Secondary | ICD-10-CM

## 2018-10-24 DIAGNOSIS — D500 Iron deficiency anemia secondary to blood loss (chronic): Secondary | ICD-10-CM

## 2018-10-24 LAB — CBC WITH DIFFERENTIAL/PLATELET
Abs Immature Granulocytes: 0.01 10*3/uL (ref 0.00–0.07)
Basophils Absolute: 0.1 10*3/uL (ref 0.0–0.1)
Basophils Relative: 1 %
Eosinophils Absolute: 0.1 10*3/uL (ref 0.0–0.5)
Eosinophils Relative: 1 %
HCT: 37.2 % (ref 36.0–46.0)
Hemoglobin: 12.7 g/dL (ref 12.0–15.0)
Immature Granulocytes: 0 %
Lymphocytes Relative: 45 %
Lymphs Abs: 4 10*3/uL (ref 0.7–4.0)
MCH: 29.5 pg (ref 26.0–34.0)
MCHC: 34.1 g/dL (ref 30.0–36.0)
MCV: 86.5 fL (ref 80.0–100.0)
Monocytes Absolute: 0.6 10*3/uL (ref 0.1–1.0)
Monocytes Relative: 6 %
Neutro Abs: 4.1 10*3/uL (ref 1.7–7.7)
Neutrophils Relative %: 47 %
Platelets: 319 10*3/uL (ref 150–400)
RBC: 4.3 MIL/uL (ref 3.87–5.11)
RDW: 16.6 % — ABNORMAL HIGH (ref 11.5–15.5)
WBC: 8.9 10*3/uL (ref 4.0–10.5)
nRBC: 0 % (ref 0.0–0.2)

## 2018-10-24 LAB — FERRITIN: Ferritin: 12 ng/mL (ref 11–307)

## 2018-10-24 LAB — VITAMIN B12: Vitamin B-12: 246 pg/mL (ref 180–914)

## 2018-10-24 NOTE — Progress Notes (Signed)
Pt in for follow up,reports "more tired and fatigued".

## 2018-10-24 NOTE — Progress Notes (Signed)
[facility] day:  10/24/2018   Chief Complaint: Molly Peters is a 57 y.o. female with iron deficiency anemia, B12 deficiency, and intra-ductal papilloma who is seen for 6 week assessment.  HPI:  The patient was last seen in the hematology clinic on 09/12/2018. At that time, she was seen after interval lumpectomy.  Pathology revealed an intraductal papilloma.  Symptomatically, she was doing well overall.  Abdominal pain had improved. She denied any further rectal bleeding.  She continued to have exertional dyspnea, however noted improvement. Exam was stable.  WBC was 6700 ([place] 3000).  Hemoglobin was 10.9, hematocrit 33.3, and platelets 312,000.  Ferritin was 81.  She started B12 monthly on 08/20/2018.  She comments that she is still taking oral B12.  During the interim, she has done well.  She states that the B12 worked wonders.  Energy level faded after a few weeks.  She denies any bleeding.  Diet is stable.  She is sleepy today as she woke up 3x during the night.  She has cut down her smoking to 1/2 pack/day.   Past Medical History:  Diagnosis Date  . Anxiety   . Cervical cancer ([place]) 1980's  . Chronic back pain 11/12/2009   Qualifier: Diagnosis of  By: Maxie Better FNP, Rosalita Levan   . COPD (chronic obstructive pulmonary disease) (HCC)    states SOB with ADLs; no O2 use; able to speak in complete sentences without SOB(11/15/2014)  . Depression   . Difficulty swallowing pills    s/p cervical fusion  . Family history of adverse reaction to anesthesia    pt's mother and sister have hx. of post-op N/V  . Fibromyalgia   . GERD (gastroesophageal reflux disease)   . Heart murmur   . History of gastric ulcer   . Hypertension    states under control with med., has been on med. x 1-2 yr.  . IBS (irritable bowel syndrome)   . Internal hemorrhoid    states has had intermittent bright red bleeding with BM (11/15/2014)  . Intraductal papilloma of  breast, right 08/08/2018  . Limited joint range of motion    neck - s/p cervical fusion  . Localized primary osteoarthritis of right shoulder region 11/23/2014  . Osteoarthritis of left shoulder 07/05/2015  . Osteoarthritis of right shoulder 11/2014  . Pneumonia   . Seizures ([place])    last seizure 2010  . Short-term memory loss   . TMJ syndrome   . Valvular heart disease    Initial workup a number of years ago at [facility].  Echo (10/2009) showed EF 60-65%,      normal LV size, moderate aortic insufficiency with a trileaflet aortic valve and normal aortic root size.  Mild      mitral regurgitation.   . Wears dentures    lower    Past Surgical History:  Procedure Laterality Date  . ABDOMINAL HYSTERECTOMY     partial  . ANTERIOR CERVICAL DECOMP/DISCECTOMY FUSION  02/06/2011   C4-5, C5-6, C6-7  . BREAST BIOPSY Bilateral 10+ yrs ago   neg  . BREAST BIOPSY Right 08/03/2018   2 areas bx, path pending  . BREAST LUMPECTOMY Bilateral 1989   x 3 - benign  . BREAST LUMPECTOMY Right 08/24/2018   Procedure: BREAST LUMPECTOMY WITH ULTRASOUND IN OR;  Surgeon: Vickie Epley, MD;  Location: [facility] ORS;  Service: General;  Laterality: Right;  . CARDIAC CATHETERIZATION  1995  . CESAREAN SECTION  x 2  . COLONOSCOPY  09/28/2008  . COLONOSCOPY N/A 06/15/2018   Procedure: COLONOSCOPY;  Surgeon: Virgel Manifold, MD;  Location: [facility] ENDOSCOPY;  Service: Endoscopy;  Laterality: N/A;  . ESOPHAGOGASTRODUODENOSCOPY  09/28/2008  . ESOPHAGOGASTRODUODENOSCOPY N/A 06/14/2018   Procedure: ESOPHAGOGASTRODUODENOSCOPY (EGD);  Surgeon: Virgel Manifold, MD;  Location: [facility] ENDOSCOPY;  Service: Endoscopy;  Laterality: N/A;  . HARDWARE REMOVAL  11/17/2006   L5-S1  . LAMINECTOMY WITH POSTERIOR LATERAL ARTHRODESIS LEVEL 3  12/06/2009   with synovial cyst resection L3-4 bilat.  Marland Kitchen LAMINECTOMY WITH POSTERIOR LATERAL ARTHRODESIS LEVEL 3  11/17/2006   L4-5  . NM MYOVIEW LTD     Lexiscan myoview (10/2009): EF 77%, normal  wall motion, normal perfusion.   Marland Kitchen SHOULDER ARTHROSCOPY WITH DEBRIDEMENT AND BICEP TENDON REPAIR Right 04/13/2014   Procedure: RIGHT SHOULDER ARTHROSCOPY WITH DEBRIDEMENT EXTENSIVE;  Surgeon: Johnny Bridge, MD;  Location: [place];  Service: Orthopedics;  Laterality: Right;  . TOTAL SHOULDER ARTHROPLASTY Right 11/23/2014   Procedure: TOTAL RIGHT SHOULDER ARTHROPLASTY;  Surgeon: Johnny Bridge, MD;  Location: [place];  Service: Orthopedics;  Laterality: Right;  . TOTAL SHOULDER ARTHROPLASTY Left 07/05/2015   Procedure: LEFT TOTAL SHOULDER REPLACEMENT;  Surgeon: Marchia Bond, MD;  Location: [place];  Service: Orthopedics;  Laterality: Left;    Family History  Problem Relation Age of Onset  . Cervical cancer Mother   . Anesthesia problems Mother        post-op N/V  . Rheum arthritis Mother   . Anxiety disorder Mother   . Cancer Father        colon cancer  . Migraines Sister   . Cervical cancer Sister   . Anesthesia problems Sister        post-op N/V  . Migraines Brother   . Asthma Daughter   . Cerebral palsy Daughter        age 42  . Coronary artery disease Maternal Grandmother   . Hypertension Maternal Grandmother   . Diabetes Maternal Grandmother   . Coronary artery disease Paternal Grandmother   . Hypertension Paternal Grandmother   . Diabetes Paternal Grandmother   . Breast cancer Maternal Aunt   . Breast cancer Maternal Aunt   . Breast cancer Maternal Aunt     Social History:  reports that she has been smoking cigarettes. She has a 46.00 pack-year smoking history. She has never used smokeless tobacco. She reports current alcohol use. She reports current drug use. Drug: Marijuana.  Patient smoked 1.5 packs of cigarettes per day for 40+ years. She drinks "a hair" every now and then. Patient has been on disability in 2005 for "ruptured back". She is a former [employer].  She is alone today.  Allergies:  Allergies  Allergen  Reactions  . Carbamazepine Hives  . Divalproex Sodium Swelling and Other (See Comments)    HAIR FALLS OUT  . Clonazepam Other (See Comments)    HALLUCINATIONS  . Diphenhydramine Hcl Rash  . Tiotropium Bromide Monohydrate Other (See Comments)    CREATES YEAST IN THROAT  . Vicodin [Hydrocodone-Acetaminophen] Itching    Current Medications: Current Outpatient Medications  Medication Sig Dispense Refill  . ALPRAZolam (XANAX) 1 MG tablet TAKE 1 TABLET THREE TIMES DAILY AS NEEDED FOR ANXIETY 90 tablet 2  . Cyanocobalamin (VITAMIN B-12 PO) Take 1 tablet by mouth daily.    . ondansetron (ZOFRAN ODT) 4 MG disintegrating tablet Take 1 tablet (4 mg total) by mouth every 8 (  eight) hours as needed for nausea or vomiting. 20 tablet 0  . cyclobenzaprine (FLEXERIL) 5 MG tablet Take 1-2 tablets 3 times daily as needed 20 tablet 0  . gabapentin (NEURONTIN) 300 MG capsule TAKE 1 CAPSULE TWICE DAILY AND TAKE 2 CAPSULES AT BEDTIME 360 capsule 0  . lamoTRIgine (LAMICTAL) 100 MG tablet TAKE 1 TABLET TWICE DAILY 180 tablet 1  . losartan-hydrochlorothiazide (HYZAAR) 100-12.5 MG tablet TAKE 1 TABLET EVERY DAY 90 tablet 1  . nicotine (NICODERM CQ - DOSED IN MG/24 HOURS) 14 mg/24hr patch Place 1 patch (14 mg total) onto the skin daily. (Patient not taking: Reported on 10/24/2018) 28 patch 0  . pantoprazole (PROTONIX) 40 MG tablet Take 1 tablet (40 mg total) by mouth 2 (two) times daily. (Patient not taking: Reported on 10/24/2018) 180 tablet 0  . PARoxetine (PAXIL) 20 MG tablet Take 1 tablet (20 mg total) by mouth daily. 90 tablet 0   No current facility-administered medications for this visit.     Review of Systems  Constitutional: Negative for chills, diaphoresis, fever, malaise/fatigue and weight loss (up 5 pounds.  Goal weight 120 pounds.).  HENT: Negative.  Negative for congestion, ear discharge, ear pain, nosebleeds, sinus pain, sore throat and tinnitus.   Eyes: Negative.  Negative for double vision,  photophobia, pain, discharge and redness.  Respiratory: Positive for shortness of breath ("little"). Negative for cough, hemoptysis, sputum production and wheezing.   Cardiovascular: Negative.  Negative for chest pain, palpitations, orthopnea, leg swelling and PND.  Gastrointestinal: Negative for abdominal pain, blood in stool, constipation, diarrhea, melena, nausea and vomiting.  Genitourinary: Negative.  Negative for dysuria, frequency, hematuria and urgency.  Musculoskeletal: Positive for joint pain and neck pain. Negative for back pain, falls and myalgias.       Joint symptoms persist.  Skin: Negative.  Negative for itching and rash.  Neurological: Negative.  Negative for dizziness, tingling, tremors, sensory change, speech change, focal weakness, weakness and headaches.  Endo/Heme/Allergies: Does not bruise/bleed easily.  Psychiatric/Behavioral: Negative for depression and memory loss. The patient is not nervous/anxious and does not have insomnia (woke up 3x last night).   All other systems reviewed and are negative.  Performance status (ECOG): 1  Vital Signs: BP (!) 149/82 (BP Location: Left Arm, Patient Position: Sitting)   Pulse 69   Temp 98.2 F (36.8 C) (Tympanic)   Resp 16   Wt 97 lb 9 oz (44.3 kg)   SpO2 96%   BMI 19.05 kg/m   Physical Exam  Constitutional: She is oriented to person, place, and time and well-developed, well-nourished, and in no distress. No distress.  HENT:  Head: Normocephalic and atraumatic.  Mouth/Throat: Oropharynx is clear and moist and mucous membranes are normal.  Long gray hair.  Eyes: Pupils are equal, round, and reactive to light. Conjunctivae and EOM are normal. No scleral icterus.  Glasses.  Blue eyes.  Neck: Normal range of motion. Neck supple. No JVD present.  Cardiovascular: Normal rate, regular rhythm, normal heart sounds and intact distal pulses. Exam reveals no gallop and no friction rub.  No murmur heard. Pulmonary/Chest: Effort  normal and breath sounds normal. No respiratory distress. She has no wheezes. She has no rales. Breast asymmetry: right breast s/p lumpectomy.  Abdominal: Soft. Bowel sounds are normal. She exhibits no distension and no mass. There is no abdominal tenderness. There is no rebound and no guarding.  Musculoskeletal: Normal range of motion.        General: No tenderness or edema.  Lymphadenopathy:    She has no cervical adenopathy.    She has no axillary adenopathy.       Right: No inguinal and no supraclavicular adenopathy present.       Left: No inguinal and no supraclavicular adenopathy present.  Neurological: She is alert and oriented to person, place, and time. Gait normal.  Skin: Skin is warm and dry. No rash noted. She is not diaphoretic. No erythema. No pallor.  Psychiatric: Mood, affect and judgment normal.  Nursing note and vitals reviewed.   Orders Only on 10/24/2018  Component Date Value Ref Range Status  . Vitamin B-12 10/24/2018 246  180 - 914 pg/mL Final   Comment: (NOTE) This assay is not validated for testing neonatal or myeloproliferative syndrome specimens for Vitamin B12 levels. Performed at [facility], [place] [address] [place], [place] [address]   . Ferritin 10/24/2018 12  11 - 307 ng/mL Final   Performed at [facility], [address] [place], [place] [address]  . WBC 10/24/2018 8.9  4.0 - 10.5 K/uL Final  . RBC 10/24/2018 4.30  3.87 - 5.11 MIL/uL Final  . Hemoglobin 10/24/2018 12.7  12.0 - 15.0 g/dL Final  . HCT 10/24/2018 37.2  36.0 - 46.0 % Final  . MCV 10/24/2018 86.5  80.0 - 100.0 fL Final  . MCH 10/24/2018 29.5  26.0 - 34.0 pg Final  . MCHC 10/24/2018 34.1  30.0 - 36.0 g/dL Final  . RDW 10/24/2018 16.6* 11.5 - 15.5 % Final  . Platelets 10/24/2018 319  150 - 400 K/uL Final  . nRBC 10/24/2018 0.0  0.0 - 0.2 % Final  . Neutrophils Relative % 10/24/2018 47  % Final  . Neutro Abs 10/24/2018 4.1  1.7 - 7.7 K/uL Final  . Lymphocytes Relative  10/24/2018 45  % Final  . Lymphs Abs 10/24/2018 4.0  0.7 - 4.0 K/uL Final  . Monocytes Relative 10/24/2018 6  % Final  . Monocytes Absolute 10/24/2018 0.6  0.1 - 1.0 K/uL Final  . Eosinophils Relative 10/24/2018 1  % Final  . Eosinophils Absolute 10/24/2018 0.1  0.0 - 0.5 K/uL Final  . Basophils Relative 10/24/2018 1  % Final  . Basophils Absolute 10/24/2018 0.1  0.0 - 0.1 K/uL Final  . Immature Granulocytes 10/24/2018 0  % Final  . Abs Immature Granulocytes 10/24/2018 0.01  0.00 - 0.07 K/uL Final   Performed at [facility], [address] [place], [place] [address]    Assessment:  DRINDA BELGARD is a 57 y.o. female with iron deficiency anemia secondary to an upper GI bleed.  She presented with intermittent melena and chest pain.  Hemoglobin nadir was 6.9 on 06/14/2018.  Ferritin was 9, iron saturation 11% and TIBC 393 on 06/20/2018.  She received 1 unit of PRBCs.  Work-up on 07/05/2018 revealed a hematocrit of 31, hemoglobin 10.2, and MCV 70.8.  Ferritin was 7.  B12 was 165 (low).  Folate was 6.3 (> 5.9).  TSH was 1.256.  Ferritin has been followed: 9 on 06/20/2018, 7 on 07/05/2018, 5 on 07/19/2018, 81 on 09/12/2018, and 12 on 10/24/2018.   She received Venofer 200 mg on 07/25/2018 and weekly x 3 (08/15/2018 - 08/29/2018).  She has B12 deficiency.  She began oral B12 on 07/06/2018.  B12 was 165 on 07/05/2018, 396 on 07/19/2018, 421 (after B12 injection) on 09/12/2018, and 246 on 10/24/2018 (on oral B12).  Folate was 6.3 on 07/05/2018 and 9.2 on 09/12/2018. She began  monthly B12 on 08/29/2018.  EGD on 06/14/2018 revealed esophageal mucosal changes c/w short segment of Barrett's and a non-bleeding gastric ulcers.  Pathology at the GE junction revealed squamocolumnar mucosa with moderate chronic inflammation.  Gastric biopsy revealed no H pylori, dysplasia, or malignancy.    Colonoscopy on 06/15/2018 revealed a 5 mm polyp in the sigmoid colon (tubular adenoma).  Repeat colonoscopy  was discussed in 6-12 months due to fair prep.    Chest CT angiogram on 06/22/2018 reveled no pulmonary embolism.  There was underlying emphysematous changes.  There were small LUL pulmonary nodular changes (largest 3 mm).  There was no thoracic adenopathy.    CT angiogram of the abdomen and pelvis revealed no evidence of abdominal aortic aneurysm or dissection. No findings to suggest active GI bleeding on CT. There was non-vascular wall thickening involving the transverse colon extending to the splenic flexure, suspicious for infectious/inflammatory colitis.  There was associated trace pelvic ascites. There was no pneumatosis or free air.  C diff and GI panel by PCR was negative.  She was treated with Flagyl and Augmentin.  BILATERAL mammogram on 07/27/2018 revealed 2 indeterminate masses in the RIGHT breast at the 2:30 and 3 o'clock positions. Follow up breast ultrasounds shows a 6 x 4 x 5 mm oval hypoechoic mass, 1 cm from the nipple, in the 2:30 position. In the 3 o'clock position, again 1 cm from the nipple, the was a 6 x 3 x 3 mm oval hypoechoic mass. LEFT breast ultrasound showed normal fibroglandular tissue at the 10 o'clock position. Ultrasound guided needle biopsy with clip placement on 08/03/2018. Pathology revealed revealed a dilated duct/cyst with sclerosis of the wall in the 2:30 lesion. Sample negative for atypia and malignancy. Lesion in the 3 o'clock position revealed an intraductal papilloma. Sample negative for atypia and malignancy.  Right breast lumpectomy on 08/24/2018 revealed an intraductal papilloma with adjacent biopsy change.  There was sclerotic changes suggestive of cyst/duct wall with adjacent biopsy change.  There was duct ectasia and cysts in the tissue between the 2 biopsy sites.  There was no atypia or malignancy.  Symptomatically, she is doing well.  She feels fatigued/sleepy today.  She has gained 5 pounds.  Energy level improved after B12.  Exam is stable.  WBC 8900  ([id number]).  Hemoglobin 12.7, hematocrit 37.2, and platelets 319,000.  Ferritin is 12.  Plan: 1. Labs today:  CBC with diff, ferritin. 2. RIGHT breast intraductal papilloma s/p excision. Continue yearly mammograms. 3. Iron deficiency anemia Labs reviewed.  Hemoglobin 12.7 and hematocrit 37.2.  MCV 86.5. Ferritin 12. Anticipate additional IV iron. 4. B12 deficiency B12 level was 421 and folate 9.2 on 09/12/2018. Patient received B12 x 1 on 08/20/2018. Follow-up B12 was 246 (low) on oral B12.   Continue monthly B12 injections and discontinue oral B12. 5. Pulmonary nodules Plan follow-up chest CT on 06/23/2019. 6. Rectal bleeding Patient denies bleeding. Colonoscopy on 06/15/2018 - colon prep was sub-optimal.  Follow- up with Dr Bonna Gains. Repeat colonoscopy planned between 12/16/2018 and 06/16/2019. 7. RN to call patient with B12 level (not available at time of clinic appointment). 8. RTC in 3 months for MD assessment, labs (CBC with diff, ferritin- day before), B12, and +/- Venofer.   Lequita Asal, MD  10/24/2018, 4:35 PM

## 2018-10-25 ENCOUNTER — Other Ambulatory Visit: Payer: Self-pay | Admitting: Hematology and Oncology

## 2018-11-08 ENCOUNTER — Encounter: Payer: Medicare HMO | Admitting: Internal Medicine

## 2018-11-21 ENCOUNTER — Inpatient Hospital Stay: Payer: Medicare HMO | Attending: Hematology and Oncology

## 2018-11-21 DIAGNOSIS — D500 Iron deficiency anemia secondary to blood loss (chronic): Secondary | ICD-10-CM | POA: Insufficient documentation

## 2018-11-21 MED ORDER — CYANOCOBALAMIN 1000 MCG/ML IJ SOLN
1000.0000 ug | Freq: Once | INTRAMUSCULAR | Status: AC
  Administered 2018-11-21: 1000 ug via INTRAMUSCULAR

## 2018-11-24 ENCOUNTER — Other Ambulatory Visit: Payer: Self-pay | Admitting: Internal Medicine

## 2018-12-06 ENCOUNTER — Other Ambulatory Visit: Payer: Self-pay

## 2018-12-06 ENCOUNTER — Emergency Department: Payer: Medicare HMO

## 2018-12-06 ENCOUNTER — Encounter: Payer: Self-pay | Admitting: Emergency Medicine

## 2018-12-06 ENCOUNTER — Emergency Department
Admission: EM | Admit: 2018-12-06 | Discharge: 2018-12-06 | Disposition: A | Discharge status: Home / Self Care | Payer: Medicare HMO | Attending: Emergency Medicine | Admitting: Emergency Medicine

## 2018-12-06 DIAGNOSIS — Z96612 Presence of left artificial shoulder joint: Secondary | ICD-10-CM | POA: Insufficient documentation

## 2018-12-06 DIAGNOSIS — Z79899 Other long term (current) drug therapy: Secondary | ICD-10-CM | POA: Diagnosis not present

## 2018-12-06 DIAGNOSIS — Z96611 Presence of right artificial shoulder joint: Secondary | ICD-10-CM | POA: Diagnosis not present

## 2018-12-06 DIAGNOSIS — I10 Essential (primary) hypertension: Secondary | ICD-10-CM | POA: Insufficient documentation

## 2018-12-06 DIAGNOSIS — F17210 Nicotine dependence, cigarettes, uncomplicated: Secondary | ICD-10-CM | POA: Insufficient documentation

## 2018-12-06 DIAGNOSIS — M25511 Pain in right shoulder: Secondary | ICD-10-CM | POA: Insufficient documentation

## 2018-12-06 DIAGNOSIS — J449 Chronic obstructive pulmonary disease, unspecified: Secondary | ICD-10-CM | POA: Diagnosis not present

## 2018-12-06 DIAGNOSIS — R030 Elevated blood-pressure reading, without diagnosis of hypertension: Secondary | ICD-10-CM

## 2018-12-06 MED ORDER — ACETAMINOPHEN-CODEINE #3 300-30 MG PO TABS: 1.0000 | ORAL_TABLET | Freq: Three times a day (TID) | ORAL | 0 refills | Status: AC | PRN

## 2018-12-06 MED ORDER — CYCLOBENZAPRINE HCL 5 MG PO TABS: ORAL_TABLET | ORAL | 0 refills | Status: DC

## 2018-12-06 MED ORDER — OXYCODONE-ACETAMINOPHEN 5-325 MG PO TABS
1.0000 | ORAL_TABLET | Freq: Once | ORAL | Status: AC
  Administered 2018-12-06: 1 via ORAL
  Filled 2018-12-06: qty 1

## 2018-12-06 NOTE — ED Notes (Signed)
PAtients husband Oluwakemi Salsberry is here and reports he is driving patient home. Patient reports she is not driving and riding home with husband. Verbalized understanding before getting oxy she cannot drive

## 2018-12-06 NOTE — Discharge Instructions (Signed)
Your x-ray shows that there may be some loosening of your hardware from your shoulder surgery.  Please call Dr. Lelan Pons and follow-up with him as soon as possible.  I have given you a short course of tramadol and Flexeril for pain.  You can wear the shoulder sling as needed.  Your blood pressure was elevated in the emergency department.  This may be due to pain.  Please follow-up with primary care as soon as possible for blood pressure recheck.

## 2018-12-06 NOTE — ED Triage Notes (Signed)
Right shoulder pain for about a month.  Hurts to move and into neck and back.  No injury

## 2018-12-06 NOTE — ED Notes (Signed)
See triage note  Presents with pain to right shoulder for the past 6 weeks States pain started at neck and moves into arm  Denies any injury  Good pulses

## 2018-12-07 NOTE — ED Provider Notes (Signed)
[facility] Emergency Department Provider Note  ____________________________________________  Time seen: Approximately 7:10 AM  I have reviewed the triage vital signs and the nursing notes.   HISTORY  Chief Complaint Shoulder Pain    HPI Molly Peters is a 58 y.o. female that presents to the emergency department for right shoulder pain for 4 weeks, worsening for 2 weeks. Pain starts in her right shoulder and radiates into neck and scapula.  She has pain with range of motion of her neck. No trauma.  Pain has not changed in character today. She had surgery 1 month ago for breast cancer. She has had both shoulders replaced 3 years ago.  No IV drug use.  No fever, chills, shortness of breath, chest pain.    Past Medical History:  Diagnosis Date  . Anxiety   . Cervical cancer ([place]) 1980's  . Chronic back pain 11/12/2009   Qualifier: Diagnosis of  By: Maxie Better FNP, Rosalita Levan   . COPD (chronic obstructive pulmonary disease) (HCC)    states SOB with ADLs; no O2 use; able to speak in complete sentences without SOB(11/15/2014)  . Depression   . Difficulty swallowing pills    s/p cervical fusion  . Family history of adverse reaction to anesthesia    pt's mother and sister have hx. of post-op N/V  . Fibromyalgia   . GERD (gastroesophageal reflux disease)   . Heart murmur   . History of gastric ulcer   . Hypertension    states under control with med., has been on med. x 1-2 yr.  . IBS (irritable bowel syndrome)   . Internal hemorrhoid    states has had intermittent bright red bleeding with BM (11/15/2014)  . Intraductal papilloma of breast, right 08/08/2018  . Limited joint range of motion    neck - s/p cervical fusion  . Localized primary osteoarthritis of right shoulder region 11/23/2014  . Osteoarthritis of left shoulder 07/05/2015  . Osteoarthritis of right shoulder 11/2014  . Pneumonia   . Seizures ([place])    last seizure 2010  . Short-term memory  loss   . TMJ syndrome   . Valvular heart disease    Initial workup a number of years ago at [facility].  Echo (10/2009) showed EF 60-65%,      normal LV size, moderate aortic insufficiency with a trileaflet aortic valve and normal aortic root size.  Mild      mitral regurgitation.   . Wears dentures    lower    Patient Active Problem List   Diagnosis Date Noted  . Pulmonary nodules 08/08/2018  . B12 deficiency 07/21/2018  . Weight loss 07/21/2018  . Iron deficiency anemia due to chronic blood loss 07/05/2018  . Microcytic anemia   . Polyp of sigmoid colon   . Columnar epithelial-lined lower esophagus   . Acute peptic ulcer of stomach   . Acute posthemorrhagic anemia   . GIB (gastrointestinal bleeding) 06/13/2018  . Hoarseness 10/27/2017  . Advance directive discussed with patient 10/27/2017  . Neuropathy 08/11/2016  . Vitamin D deficiency 03/15/2016  . 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 Hx of cervical spine surgery (Left ACDF) 02/02/2016  . Chronic pain 01/29/2016  . Marijuana use 01/29/2016  . Osteoarthritis of shoulder (Left) 07/05/2015  . S/P Bilateral Total Shoulder Replacement (2016) 11/23/2014  . Osteoarthritis of shoulder (Right) 04/13/2014  . Routine general medical examination at a health care facility 04/05/2013  . CAROTID  BRUIT, LEFT 01/02/2011  . Restless legs syndrome (RLS) 10/21/2010  . Essential hypertension, benign 06/23/2010  . HYPERLIPIDEMIA-MIXED 10/23/2009  . Valvular heart disease 03/27/2009  . IRRITABLE BOWEL SYNDROME 11/13/2008  . Mood disorder ([place]) 09/07/2007  . Tobacco use disorder 09/07/2007  . GLAUCOMA 09/07/2007  . HEMORRHOIDS 09/07/2007  . GASTRIC ULCER 09/07/2007  . COPD (chronic obstructive pulmonary disease) with chronic bronchitis ([place]) 08/08/2007  . GERD 08/08/2007  . Seizure disorder ([place]) 08/08/2007    Past Surgical History:  Procedure Laterality Date  . ABDOMINAL HYSTERECTOMY     partial   . ANTERIOR CERVICAL DECOMP/DISCECTOMY FUSION  02/06/2011   C4-5, C5-6, C6-7  . BREAST BIOPSY Bilateral 10+ yrs ago   neg  . BREAST BIOPSY Right 08/03/2018   2 areas bx, path pending  . BREAST LUMPECTOMY Bilateral 1989   x 3 - benign  . BREAST LUMPECTOMY Right 08/24/2018   Procedure: BREAST LUMPECTOMY WITH ULTRASOUND IN OR;  Surgeon: Vickie Epley, MD;  Location: [facility] ORS;  Service: General;  Laterality: Right;  . CARDIAC CATHETERIZATION  1995  . CESAREAN SECTION     x 2  . COLONOSCOPY  09/28/2008  . COLONOSCOPY N/A 06/15/2018   Procedure: COLONOSCOPY;  Surgeon: Virgel Manifold, MD;  Location: [facility] ENDOSCOPY;  Service: Endoscopy;  Laterality: N/A;  . ESOPHAGOGASTRODUODENOSCOPY  09/28/2008  . ESOPHAGOGASTRODUODENOSCOPY N/A 06/14/2018   Procedure: ESOPHAGOGASTRODUODENOSCOPY (EGD);  Surgeon: Virgel Manifold, MD;  Location[facility] ENDOSCOPY;  Service: Endoscopy;  Laterality: N/A;  . HARDWARE REMOVAL  11/17/2006   L5-S1  . LAMINECTOMY WITH POSTERIOR LATERAL ARTHRODESIS LEVEL 3  12/06/2009   with synovial cyst resection L3-4 bilat.  Marland Kitchen LAMINECTOMY WITH POSTERIOR LATERAL ARTHRODESIS LEVEL 3  11/17/2006   L4-5  . NM MYOVIEW LTD     Lexiscan myoview (10/2009): EF 77%, normal wall motion, normal perfusion.   Marland Kitchen SHOULDER ARTHROSCOPY WITH DEBRIDEMENT AND BICEP TENDON REPAIR Right 04/13/2014   Procedure: RIGHT SHOULDER ARTHROSCOPY WITH DEBRIDEMENT EXTENSIVE;  Surgeon: Johnny Bridge, MD;  Location: [place];  Service: Orthopedics;  Laterality: Right;  . TOTAL SHOULDER ARTHROPLASTY Right 11/23/2014   Procedure: TOTAL RIGHT SHOULDER ARTHROPLASTY;  Surgeon: Johnny Bridge, MD;  Location: [place];  Service: Orthopedics;  Laterality: Right;  . TOTAL SHOULDER ARTHROPLASTY Left 07/05/2015   Procedure: LEFT TOTAL SHOULDER REPLACEMENT;  Surgeon: Marchia Bond, MD;  Location: [place];  Service: Orthopedics;  Laterality: Left;    Prior to Admission  medications   Medication Sig Start Date End Date Taking? Authorizing Provider  acetaminophen-codeine (TYLENOL #3) 300-30 MG tablet Take 1 tablet by mouth every 8 (eight) hours as needed for up to 3 days for moderate pain. 12/06/18 12/09/18  Laban Emperor, PA-C  ALPRAZolam Duanne Moron) 1 MG tablet TAKE 1 TABLET THREE TIMES DAILY AS NEEDED FOR ANXIETY 09/06/18   Venia Carbon, MD  Cyanocobalamin (VITAMIN B-12 PO) Take 1 tablet by mouth daily.    [provider]  cyclobenzaprine (FLEXERIL) 5 MG tablet Take 1-2 tablets 3 times daily as needed 12/06/18   Laban Emperor, PA-C  gabapentin (NEURONTIN) 300 MG capsule TAKE 1 CAPSULE TWICE DAILY AND TAKE 2 CAPSULES AT BEDTIME 11/24/18   Venia Carbon, MD  lamoTRIgine (LAMICTAL) 100 MG tablet TAKE 1 TABLET TWICE DAILY Patient taking differently: Take 100 mg by mouth 2 (two) times daily.  06/06/18   Venia Carbon, MD  losartan-hydrochlorothiazide (HYZAAR) 100-12.5 MG tablet TAKE 1 TABLET EVERY DAY Patient taking differently:  Take 1 tablet by mouth daily.  05/02/18   Venia Carbon, MD  nicotine (NICODERM CQ - DOSED IN MG/24 HOURS) 14 mg/24hr patch Place 1 patch (14 mg total) onto the skin daily. Patient not taking: Reported on 10/24/2018 06/15/18   Bettey Costa, MD  ondansetron (ZOFRAN ODT) 4 MG disintegrating tablet Take 1 tablet (4 mg total) by mouth every 8 (eight) hours as needed for nausea or vomiting. 08/08/18   Alfred Levins, [place], MD  pantoprazole (PROTONIX) 40 MG tablet Take 1 tablet (40 mg total) by mouth 2 (two) times daily. Patient not taking: Reported on 10/24/2018 06/28/18 09/26/18  Virgel Manifold, MD  PARoxetine (PAXIL) 20 MG tablet Take 1 tablet (20 mg total) by mouth daily. 11/24/18   Venia Carbon, MD    Allergies Carbamazepine; Divalproex sodium; Clonazepam; Diphenhydramine hcl; Tiotropium bromide monohydrate; and Vicodin [hydrocodone-acetaminophen]  Family History  Problem Relation Age of Onset  . Cervical cancer  Mother   . Anesthesia problems Mother        post-op N/V  . Rheum arthritis Mother   . Anxiety disorder Mother   . Cancer Father        colon cancer  . Migraines Sister   . Cervical cancer Sister   . Anesthesia problems Sister        post-op N/V  . Migraines Brother   . Asthma Daughter   . Cerebral palsy Daughter        age 9  . Coronary artery disease Maternal Grandmother   . Hypertension Maternal Grandmother   . Diabetes Maternal Grandmother   . Coronary artery disease Paternal Grandmother   . Hypertension Paternal Grandmother   . Diabetes Paternal Grandmother   . Breast cancer Maternal Aunt   . Breast cancer Maternal Aunt   . Breast cancer Maternal Aunt     Social History Social History   Tobacco Use  . Smoking status: Current Every Day Smoker    Packs/day: 1.00    Years: 46.00    Pack years: 46.00    Types: Cigarettes  . Smokeless tobacco: Never Used  Substance Use Topics  . Alcohol use: Yes    Alcohol/week: 0.0 standard drinks    Comment: rare  . Drug use: Yes    Types: Marijuana    Comment: per pt she smoke thc at bedtime and prn  ( for pain relief and to help sleep since not given percocet anymore )-per pt      Review of Systems  Constitutional: No fever/chills Cardiovascular: No chest pain. Respiratory: No SOB. Gastrointestinal: No nausea, no vomiting.  Musculoskeletal: Positive for shoulder pain.  Skin: Negative for rash, abrasions, lacerations, ecchymosis. Neurological: Negative for numbness or tingling   ____________________________________________   PHYSICAL EXAM:  VITAL SIGNS: ED Triage Vitals  Enc Vitals Group     BP 12/06/18 0950 (!) 165/101     Pulse Rate 12/06/18 0950 81     Resp 12/06/18 0950 16     Temp 12/06/18 0950 (!) 97.5 F (36.4 C)     Temp Source 12/06/18 0950 Oral     SpO2 12/06/18 0950 98 %     Weight 12/06/18 0946 101 lb (45.8 kg)     Height 12/06/18 0946 5\' 1"  (1.549 m)     Head Circumference --      Peak Flow --       Pain Score 12/06/18 0945 7     Pain Loc --      Pain Edu? --  Excl. in [place]? --      Constitutional: Alert and oriented. Well appearing and in no acute distress. Eyes: Conjunctivae are normal. PERRL. EOMI. Head: Atraumatic. ENT:      Ears:      Nose: No congestion/rhinnorhea.      Mouth/Throat: Mucous membranes are moist.  Neck: No stridor.  No cervical spine tenderness to palpation. Pain with ROM of neck.  Cardiovascular: Normal rate, regular rhythm.  Good peripheral circulation. Respiratory: Normal respiratory effort without tachypnea or retractions. Lungs CTAB. Good air entry to the bases with no decreased or absent breath sounds. Gastrointestinal: Bowel sounds 4 quadrants. Soft and nontender to palpation. No guarding or rigidity. No palpable masses. No distention.  Musculoskeletal: Full range of motion to all extremities. No gross deformities appreciated. Tenderness to palpation of right shoulder. Pain with abduction of right shoulder.  Full range of motion of shoulder but with pain.  No swelling or erythema. Neurologic:  Normal speech and language. No gross focal neurologic deficits are appreciated.  Skin:  Skin is warm, dry and intact. No rash noted. Psychiatric: Mood and affect are normal. Speech and behavior are normal. Patient exhibits appropriate insight and judgement.   ____________________________________________   LABS (all labs ordered are listed, but only abnormal results are displayed)  Labs Reviewed - No data to display ____________________________________________  EKG   ____________________________________________  RADIOLOGY Robinette Haines, personally viewed and evaluated these images (plain radiographs) as part of my medical decision making, as well as reviewing the written report by the radiologist.  Dg Shoulder Right  Result Date: 12/06/2018 CLINICAL DATA:  Right shoulder pain with painful range of motion. Previous total shoulder arthroplasty.  EXAM: RIGHT SHOULDER - 2+ VIEW COMPARISON:  None. FINDINGS: There is no fracture or dislocation. The components of the shoulder prosthesis appear to be in good position. There is lucency around the anchors of the glenoid component which could represent loosening. No soft tissue calcifications.  AC joint is normal. IMPRESSION: Slight lucency around the anchors of the glenoid component of the total shoulder prosthesis. This could represent loosening. No other significant findings. Electronically Signed   By: Lorriane Shire M.D.   On: 12/06/2018 12:09    ____________________________________________    PROCEDURES  Procedure(s) performed:    Procedures    Medications  oxyCODONE-acetaminophen (PERCOCET/ROXICET) 5-325 MG per tablet 1 tablet (1 tablet Oral Given 12/06/18 1307)     ____________________________________________   INITIAL IMPRESSION / ASSESSMENT AND PLAN / ED COURSE  Pertinent labs & imaging results that were available during my care of the patient were reviewed by me and considered in my medical decision making (see chart for details).  Review of the [place] CSRS was performed in accordance of the [place] prior to dispensing any controlled drugs.     Patient presented to emergency department for evaluation of shoulder pain for 4 weeks.  Vital signs and exam are reassuring.  Patient had replacement 3 years ago by Dr. Nigel Berthold Doe in [place].  X-ray shows changes concerning for possible loosening of the hardware.  Patient was given a shoulder sling.  Patient will be discharged home with prescriptions for a short course of tylenol #3 and Flexeril. Patient is to follow up with orthopedics as directed. Patient is given ED precautions to return to the ED for any worsening or new symptoms.     ____________________________________________  FINAL CLINICAL IMPRESSION(S) / ED DIAGNOSES  Final diagnoses:  Acute pain of right shoulder  Elevated blood pressure reading  History  of  replacement of both shoulder joints      NEW MEDICATIONS STARTED DURING THIS VISIT:  ED Discharge Orders         Ordered    cyclobenzaprine (FLEXERIL) 5 MG tablet     12/06/18 1258    acetaminophen-codeine (TYLENOL #3) 300-30 MG tablet  Every 8 hours PRN     12/06/18 1258              This chart was dictated using voice recognition software/Dragon. Despite best efforts to proofread, errors can occur which can change the meaning. Any change was purely unintentional.    Laban Emperor, PA-C 12/07/18 3710    Arta Silence, MD 12/08/18 [phone]

## 2018-12-09 ENCOUNTER — Other Ambulatory Visit: Payer: Self-pay | Admitting: Orthopedic Surgery

## 2018-12-09 DIAGNOSIS — M25511 Pain in right shoulder: Secondary | ICD-10-CM | POA: Diagnosis not present

## 2018-12-09 DIAGNOSIS — Z1159 Encounter for screening for other viral diseases: Secondary | ICD-10-CM | POA: Diagnosis not present

## 2018-12-14 ENCOUNTER — Telehealth: Payer: Self-pay

## 2018-12-14 NOTE — Telephone Encounter (Signed)
Pt went to ortho last week and having scan later this week.

## 2018-12-16 ENCOUNTER — Ambulatory Visit
Admission: RE | Admit: 2018-12-16 | Discharge: 2018-12-16 | Disposition: A | Discharge status: Home / Self Care | Payer: Medicare HMO | Source: Ambulatory Visit | Attending: Orthopedic Surgery | Admitting: Orthopedic Surgery

## 2018-12-16 DIAGNOSIS — M25511 Pain in right shoulder: Secondary | ICD-10-CM

## 2018-12-16 MED ORDER — IOPAMIDOL (ISOVUE-M 200) INJECTION 41%
15.0000 mL | Freq: Once | INTRAMUSCULAR | Status: AC
  Administered 2018-12-16: 15 mL via INTRA_ARTICULAR

## 2018-12-19 ENCOUNTER — Inpatient Hospital Stay: Payer: Medicare HMO | Attending: Hematology and Oncology

## 2018-12-19 DIAGNOSIS — D500 Iron deficiency anemia secondary to blood loss (chronic): Secondary | ICD-10-CM

## 2018-12-19 DIAGNOSIS — E538 Deficiency of other specified B group vitamins: Secondary | ICD-10-CM | POA: Diagnosis not present

## 2018-12-19 MED ORDER — CYANOCOBALAMIN 1000 MCG/ML IJ SOLN
1000.0000 ug | Freq: Once | INTRAMUSCULAR | Status: AC
  Administered 2018-12-19: 1000 ug via INTRAMUSCULAR

## 2018-12-21 DIAGNOSIS — M25511 Pain in right shoulder: Secondary | ICD-10-CM | POA: Diagnosis not present

## 2018-12-22 LAB — ANAEROBIC AND AEROBIC CULTURE
AER RESULT:: NO GROWTH
GRAM STAIN:: NONE SEEN
MICRO NUMBER:: 166607
MICRO NUMBER:: 166608
SPECIMEN QUALITY:: ADEQUATE
SPECIMEN QUALITY:: ADEQUATE

## 2018-12-22 LAB — SYNOVIAL CELL COUNT + DIFF, W/ CRYSTALS: WBC, Synovial: 0 cells/uL (ref <150)

## 2018-12-26 ENCOUNTER — Other Ambulatory Visit: Payer: Self-pay | Admitting: Internal Medicine

## 2019-01-05 ENCOUNTER — Telehealth: Payer: Self-pay

## 2019-01-05 NOTE — Telephone Encounter (Signed)
Contacted patient to inform of low Ferritin levels and suggested weekly Venofer treatments x 3 (confirmed with Honor Loh, NP). Patient agrees. Informed her Shirlean Mylar would be in touch with her to schedule treatment in [place] location. Patient verbalizes understanding and denies any questions at this time.

## 2019-01-10 ENCOUNTER — Inpatient Hospital Stay: Payer: Medicare HMO | Attending: Hematology and Oncology

## 2019-01-10 VITALS — BP 148/86 | HR 71 | Temp 97.6°F | Resp 18

## 2019-01-10 DIAGNOSIS — D500 Iron deficiency anemia secondary to blood loss (chronic): Secondary | ICD-10-CM | POA: Insufficient documentation

## 2019-01-10 MED ORDER — SODIUM CHLORIDE 0.9 % IV SOLN
Freq: Once | INTRAVENOUS | Status: AC
  Administered 2019-01-10: 14:00:00 via INTRAVENOUS
  Filled 2019-01-10: qty 250

## 2019-01-10 MED ORDER — IRON SUCROSE 20 MG/ML IV SOLN
200.0000 mg | Freq: Once | INTRAVENOUS | Status: AC
  Administered 2019-01-10: 200 mg via INTRAVENOUS
  Filled 2019-01-10: qty 10

## 2019-01-12 ENCOUNTER — Encounter: Payer: Self-pay | Admitting: *Deleted

## 2019-01-17 ENCOUNTER — Inpatient Hospital Stay: Payer: Medicare HMO

## 2019-01-17 VITALS — BP 131/82 | HR 62 | Temp 97.6°F | Resp 18

## 2019-01-17 DIAGNOSIS — D500 Iron deficiency anemia secondary to blood loss (chronic): Secondary | ICD-10-CM | POA: Diagnosis not present

## 2019-01-17 MED ORDER — SODIUM CHLORIDE 0.9 % IV SOLN
Freq: Once | INTRAVENOUS | Status: AC
  Administered 2019-01-17: 14:00:00 via INTRAVENOUS
  Filled 2019-01-17: qty 250

## 2019-01-17 MED ORDER — IRON SUCROSE 20 MG/ML IV SOLN
200.0000 mg | Freq: Once | INTRAVENOUS | Status: AC
  Administered 2019-01-17: 200 mg via INTRAVENOUS
  Filled 2019-01-17: qty 10

## 2019-01-21 ENCOUNTER — Telehealth: Payer: Self-pay

## 2019-01-21 NOTE — Telephone Encounter (Signed)
Call pt regarding lung screening. PT is a current smoker, smoking about a 1 pack per day. PT would like afternoon for the scan on any day. Pt had a lump in stomach that she is currently seeing MD.

## 2019-01-23 ENCOUNTER — Telehealth: Payer: Self-pay | Admitting: *Deleted

## 2019-01-23 ENCOUNTER — Other Ambulatory Visit: Payer: Medicare HMO

## 2019-01-23 ENCOUNTER — Inpatient Hospital Stay: Payer: Medicare HMO

## 2019-01-23 ENCOUNTER — Ambulatory Visit: Payer: Medicare HMO | Admitting: Hematology and Oncology

## 2019-01-23 ENCOUNTER — Encounter: Payer: Self-pay | Admitting: *Deleted

## 2019-01-23 DIAGNOSIS — Z122 Encounter for screening for malignant neoplasm of respiratory organs: Secondary | ICD-10-CM

## 2019-01-23 NOTE — Telephone Encounter (Signed)
Patient has been notified that the annual lung cancer screening low dose CT scan is due currently or will be in the near future.  Confirmed that the patient is within the age range of 28-80, and asymptomatic, and currently exhibits no signs or symptoms of lung cancer.  Patient denies illness that would prevent curative treatment for lung cancer if found.  Verified smoking history, current smoker 1 ppd with 64pkyr hx.  The shared decision making visit was completed on 02-02-2018.  Patient is agreeable for the CT scan to be scheduled.  Will call patient back with date and time of appointment.

## 2019-01-24 ENCOUNTER — Inpatient Hospital Stay: Payer: Medicare HMO

## 2019-01-24 ENCOUNTER — Ambulatory Visit: Payer: Medicare HMO | Admitting: Urgent Care

## 2019-01-26 ENCOUNTER — Telehealth: Payer: Self-pay | Admitting: *Deleted

## 2019-01-26 NOTE — Telephone Encounter (Signed)
Patient notified/message left to notify patient that due to current restrictions lung screening appointments are cancelled and patient will be contacted regarding rescheduling.  

## 2019-02-06 ENCOUNTER — Other Ambulatory Visit: Payer: Self-pay

## 2019-02-07 ENCOUNTER — Other Ambulatory Visit: Payer: Self-pay

## 2019-02-07 ENCOUNTER — Inpatient Hospital Stay: Payer: Medicare HMO | Attending: Hematology and Oncology

## 2019-02-07 DIAGNOSIS — D500 Iron deficiency anemia secondary to blood loss (chronic): Secondary | ICD-10-CM

## 2019-02-07 LAB — CBC WITH DIFFERENTIAL/PLATELET
Abs Immature Granulocytes: 0.03 10*3/uL (ref 0.00–0.07)
Basophils Absolute: 0.1 10*3/uL (ref 0.0–0.1)
Basophils Relative: 1 %
Eosinophils Absolute: 0.1 10*3/uL (ref 0.0–0.5)
Eosinophils Relative: 2 %
HCT: 41.4 % (ref 36.0–46.0)
Hemoglobin: 14.7 g/dL (ref 12.0–15.0)
Immature Granulocytes: 0 %
Lymphocytes Relative: 42 %
Lymphs Abs: 3.1 10*3/uL (ref 0.7–4.0)
MCH: 32.2 pg (ref 26.0–34.0)
MCHC: 35.5 g/dL (ref 30.0–36.0)
MCV: 90.6 fL (ref 80.0–100.0)
Monocytes Absolute: 0.5 10*3/uL (ref 0.1–1.0)
Monocytes Relative: 7 %
Neutro Abs: 3.6 10*3/uL (ref 1.7–7.7)
Neutrophils Relative %: 48 %
Platelets: 296 10*3/uL (ref 150–400)
RBC: 4.57 MIL/uL (ref 3.87–5.11)
RDW: 13.2 % (ref 11.5–15.5)
WBC: 7.4 10*3/uL (ref 4.0–10.5)
nRBC: 0 % (ref 0.0–0.2)

## 2019-02-07 LAB — FERRITIN: Ferritin: 71 ng/mL (ref 11–307)

## 2019-02-08 ENCOUNTER — Telehealth: Payer: Self-pay | Admitting: Hematology and Oncology

## 2019-02-09 ENCOUNTER — Ambulatory Visit (INDEPENDENT_AMBULATORY_CARE_PROVIDER_SITE_OTHER): Payer: Medicare HMO | Admitting: Internal Medicine

## 2019-02-09 ENCOUNTER — Encounter: Payer: Self-pay | Admitting: Hematology and Oncology

## 2019-02-09 ENCOUNTER — Ambulatory Visit: Payer: Medicare HMO

## 2019-02-09 ENCOUNTER — Telehealth: Payer: Self-pay

## 2019-02-09 ENCOUNTER — Encounter: Payer: Self-pay | Admitting: Internal Medicine

## 2019-02-09 ENCOUNTER — Inpatient Hospital Stay: Payer: Medicare HMO | Attending: Hematology and Oncology | Admitting: Hematology and Oncology

## 2019-02-09 ENCOUNTER — Other Ambulatory Visit: Payer: Self-pay

## 2019-02-09 DIAGNOSIS — F17210 Nicotine dependence, cigarettes, uncomplicated: Secondary | ICD-10-CM | POA: Insufficient documentation

## 2019-02-09 DIAGNOSIS — R918 Other nonspecific abnormal finding of lung field: Secondary | ICD-10-CM

## 2019-02-09 DIAGNOSIS — J449 Chronic obstructive pulmonary disease, unspecified: Secondary | ICD-10-CM | POA: Insufficient documentation

## 2019-02-09 DIAGNOSIS — E538 Deficiency of other specified B group vitamins: Secondary | ICD-10-CM | POA: Diagnosis not present

## 2019-02-09 DIAGNOSIS — I10 Essential (primary) hypertension: Secondary | ICD-10-CM | POA: Insufficient documentation

## 2019-02-09 DIAGNOSIS — D509 Iron deficiency anemia, unspecified: Secondary | ICD-10-CM | POA: Insufficient documentation

## 2019-02-09 DIAGNOSIS — F419 Anxiety disorder, unspecified: Secondary | ICD-10-CM | POA: Insufficient documentation

## 2019-02-09 DIAGNOSIS — Z79899 Other long term (current) drug therapy: Secondary | ICD-10-CM | POA: Insufficient documentation

## 2019-02-09 DIAGNOSIS — F39 Unspecified mood [affective] disorder: Secondary | ICD-10-CM

## 2019-02-09 DIAGNOSIS — D500 Iron deficiency anemia secondary to blood loss (chronic): Secondary | ICD-10-CM | POA: Diagnosis not present

## 2019-02-09 DIAGNOSIS — F329 Major depressive disorder, single episode, unspecified: Secondary | ICD-10-CM | POA: Insufficient documentation

## 2019-02-09 NOTE — Assessment & Plan Note (Signed)
No visit

## 2019-02-09 NOTE — Telephone Encounter (Signed)
A lot has happened with her since her last visit--so good idea to have visit I don't expect any issue with refilling her medication

## 2019-02-09 NOTE — Telephone Encounter (Signed)
[facility] Night - Client Nonclinical Telephone Record [facility] Night - Client Client Site [place] Physician Viviana Simpler - MD Contact Type Call Who Is Calling Patient / Member / Family / Caregiver Caller Name Emmons Phone Number [phone] Patient Name Molly Peters Patient DOB [birth year] Call Type Message Only Information Provided Reason for Call Medication Question / Request Initial Comment Caller st she has been having an issue getting medications filled. Additional Comment Call Closed By: Luiz Blare Transaction Date/Time: 02/08/2019 5:10:33 PM (ET)

## 2019-02-09 NOTE — Progress Notes (Signed)
   Subjective:    Patient ID: Molly Peters, female    DOB: 1961-04-21, 58 y.o.   MRN: [id number]  HPI Video visit for review of her anxiety, other medical issues and to consider renewing her alprazolam Waited on WebEx for 15 minutes but she didn't go on Had not answered her phone for an hour before the scheduled visit  Review of Systems     Objective:   Physical Exam         Assessment & Plan:

## 2019-02-09 NOTE — Telephone Encounter (Signed)
Outbound call to patient for medication management. Patient is requesting medication refill as follows:  Name of Medication: Aprazolam Name of Pharmacy: [facility] or Written Date and Quantity: 09/06/18, #90 Last Office Visit and Type: 06/20/18, hospital f/u Next Office Visit and Type: N/A Last Controlled Substance Agreement Date: 04/05/13 Last UDS: N/A  Patient is able to complete virtual video appointment. Appointment scheduled 02/09/19 @ 1600. [email or web address]   Note: patient has 8 pills remaining from current prescription.

## 2019-02-09 NOTE — Progress Notes (Signed)
No new changes noted today. The patient Name / DOB/ and address had been verified with tele visit today, CBG, CMA

## 2019-02-09 NOTE — Progress Notes (Signed)
[facility]  [address], [address] [place], [place] [address] Phone: [phone]  Fax: [phone]   Telephone Office Visit:  02/09/2019  I connected with Molly Peters on 02/09/19 at 1:55 PM EDT by telephone and verified that I was speaking with the correct person using 2 identifiers.  The patient was at home.  I discussed the limitations, risk, security and privacy concerns of performing an evaluation and management service by telephone and  the availability of in person appointments.  I also discussed with the patient that there may be a patient responsible charge related to this service.  The patient expressed understanding and agreed to proceed.    Chief Complaint: Molly Peters is a 58 y.o. female with iron deficiency anemia, B12 deficiency, and intra-ductal papilloma who is evaluated for 4 month assessment.  HPI:  The patient was last seen in the hematology clinic on 10/24/2018. At that time, she was doing well.  She felt fatigued/sleepy today.  She had gained 5 pounds.  Energy level improved after B12.  Exam was stable.  WBC was 8900 ([id number]).  Hemoglobin 12.7, hematocrit 37.2, and platelets 319,000.  Ferritin was 12.  She received B12 on 11/21/2018 and 12/19/2018.  She received Venofer on 01/10/2019 and 01/17/2019.  During the interim, she denies any concerns.  She notes "normal shortness of breath".  She denies any bleeding.   Past Medical History:  Diagnosis Date   Anxiety    Cervical cancer ([place]) 1980's   Chronic back pain 11/12/2009   Qualifier: Diagnosis of  By: Maxie Better FNP, Rosalita Levan    COPD (chronic obstructive pulmonary disease) ([place])    states SOB with ADLs; no O2 use; able to speak in complete sentences without SOB(11/15/2014)   Depression    Difficulty swallowing pills    s/p cervical fusion   Family history of adverse reaction to anesthesia    pt's mother and sister have hx. of post-op N/V   Fibromyalgia     GERD (gastroesophageal reflux disease)    Heart murmur    History of gastric ulcer    Hypertension    states under control with med., has been on med. x 1-2 yr.   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  Echo (10/2009) showed EF 60-65%,      normal LV size, moderate aortic insufficiency with a trileaflet aortic valve and normal aortic root size.  Mild      mitral regurgitation.    Wears dentures    lower    Past Surgical History:  Procedure Laterality Date   ABDOMINAL HYSTERECTOMY     partial   ANTERIOR CERVICAL DECOMP/DISCECTOMY FUSION  02/06/2011   C4-5, C5-6, C6-7   BREAST BIOPSY Bilateral 10+ yrs ago   neg   BREAST BIOPSY Right 08/03/2018   2 areas bx, path pending   BREAST LUMPECTOMY Bilateral 1989  x 3 - benign   BREAST LUMPECTOMY Right 08/24/2018   Procedure: BREAST LUMPECTOMY WITH ULTRASOUND IN OR;  Surgeon: Vickie Epley, MD;  Location: [facility] ORS;  Service: General;  Laterality: Right;   [place]     x 2   COLONOSCOPY  09/28/2008   COLONOSCOPY N/A 06/15/2018   Procedure: COLONOSCOPY;  Surgeon: Virgel Manifold, MD;  Location: [facility] ENDOSCOPY;  Service: Endoscopy;  Laterality: N/A;   ESOPHAGOGASTRODUODENOSCOPY  09/28/2008   ESOPHAGOGASTRODUODENOSCOPY N/A 06/14/2018   Procedure: ESOPHAGOGASTRODUODENOSCOPY (EGD);  Surgeon: Virgel Manifold, MD;  Location: [facility] ENDOSCOPY;  Service:  Endoscopy;  Laterality: N/A;   HARDWARE REMOVAL  11/17/2006   L5-S1   LAMINECTOMY WITH POSTERIOR LATERAL ARTHRODESIS LEVEL 3  12/06/2009   with synovial cyst resection L3-4 bilat.   LAMINECTOMY WITH POSTERIOR LATERAL ARTHRODESIS LEVEL 3  11/17/2006   L4-5   NM MYOVIEW LTD     Lexiscan myoview (10/2009): EF 77%, normal wall motion, normal perfusion.    SHOULDER ARTHROSCOPY WITH DEBRIDEMENT AND BICEP TENDON REPAIR Right 04/13/2014   Procedure: RIGHT SHOULDER ARTHROSCOPY WITH DEBRIDEMENT EXTENSIVE;  Surgeon: Johnny Bridge, MD;  Location: [facility];  Service: Orthopedics;  Laterality: Right;   TOTAL SHOULDER ARTHROPLASTY Right 11/23/2014   Procedure: TOTAL RIGHT SHOULDER ARTHROPLASTY;  Surgeon: Johnny Bridge, MD;  Location: [facility];  Service: Orthopedics;  Laterality: Right;   TOTAL SHOULDER ARTHROPLASTY Left 07/05/2015   Procedure: LEFT TOTAL SHOULDER REPLACEMENT;  Surgeon: Marchia Bond, MD;  Location: [facility];  Service: Orthopedics;  Laterality: Left;    Family History  Problem Relation Age of Onset   Cervical cancer Mother    Anesthesia problems Mother        post-op N/V   Rheum arthritis Mother    Anxiety disorder Mother    Cancer Father        colon cancer   Migraines Sister    Cervical cancer Sister    Anesthesia problems Sister        post-op N/V   Migraines Brother    Asthma Daughter    Cerebral palsy Daughter        age 76   Coronary artery disease Maternal Grandmother    Hypertension Maternal Grandmother    Diabetes Maternal Grandmother    Coronary artery disease Paternal Grandmother    Hypertension Paternal Grandmother    Diabetes Paternal Grandmother    Breast cancer Maternal Aunt    Breast cancer Maternal Aunt    Breast cancer Maternal Aunt     Social History:  reports that she has been smoking cigarettes. She has a 64.13 pack-year smoking history. She has never used smokeless tobacco. She  reports current alcohol use. She reports current drug use. Drug: Marijuana.  Patient smoked 1.5 packs of cigarettes per day for 40+ years. She drinks "a hair" every now and then. Patient has been on disability in 2005 for "ruptured back". She is a former [employer].   Participants in the patient's visit included the patient and Vito Berger, CMA, today.  The intake visit was provided by Vito Berger, CMA.   Allergies:  Allergies  Allergen Reactions   Carbamazepine Hives   Divalproex Sodium Swelling and Other (See Comments)    HAIR FALLS OUT   Clonazepam Other (See Comments)    HALLUCINATIONS   Diphenhydramine Hcl Rash   Tiotropium Bromide Monohydrate Other (See Comments)    CREATES YEAST IN THROAT  Vicodin [Hydrocodone-Acetaminophen] Itching    Current Medications: Current Outpatient Medications  Medication Sig Dispense Refill   ALPRAZolam (XANAX) 1 MG tablet TAKE 1 TABLET THREE TIMES DAILY AS NEEDED FOR ANXIETY 90 tablet 2   cyclobenzaprine (FLEXERIL) 5 MG tablet Take 1-2 tablets 3 times daily as needed 20 tablet 0   gabapentin (NEURONTIN) 300 MG capsule TAKE 1 CAPSULE TWICE DAILY AND TAKE 2 CAPSULES AT BEDTIME (Patient taking differently: Take 300 mg by mouth 4 (four) times daily. ) 360 capsule 0   lamoTRIgine (LAMICTAL) 100 MG tablet TAKE 1 TABLET TWICE DAILY 180 tablet 1   losartan-hydrochlorothiazide (HYZAAR) 100-12.5 MG tablet TAKE 1 TABLET EVERY DAY 90 tablet 1   pantoprazole (PROTONIX) 40 MG tablet Take 1 tablet (40 mg total) by mouth 2 (two) times daily. 180 tablet 0   PARoxetine (PAXIL) 20 MG tablet Take 1 tablet (20 mg total) by mouth daily. 90 tablet 0   Cyanocobalamin (VITAMIN B-12 PO) Take 1 tablet by mouth daily.     nicotine (NICODERM CQ - DOSED IN MG/24 HOURS) 14 mg/24hr patch Place 1 patch (14 mg total) onto the skin daily. (Patient not taking: Reported on 10/24/2018) 28 patch 0   ondansetron (ZOFRAN ODT) 4 MG disintegrating tablet Take 1 tablet (4  mg total) by mouth every 8 (eight) hours as needed for nausea or vomiting. (Patient not taking: Reported on 02/09/2019) 20 tablet 0   No current facility-administered medications for this visit.     Review of Systems  Constitutional: Negative.  Negative for chills, diaphoresis, fever, malaise/fatigue and weight loss.  HENT: Negative.  Negative for congestion, ear discharge, ear pain, nosebleeds, sinus pain and sore throat.   Eyes: Negative.  Negative for blurred vision, double vision, photophobia, pain, discharge and redness.  Respiratory: Positive for shortness of breath ("normal"). Negative for cough, hemoptysis, sputum production and wheezing.   Cardiovascular: Negative.  Negative for chest pain, palpitations, orthopnea, leg swelling and PND.  Gastrointestinal: Negative for abdominal pain, blood in stool, constipation, diarrhea, melena, nausea and vomiting.  Genitourinary: Negative.  Negative for dysuria, frequency and hematuria.  Musculoskeletal: Positive for joint pain. Negative for back pain, falls, myalgias and neck pain.  Skin: Negative.  Negative for itching and rash.  Neurological: Negative.  Negative for dizziness, tingling, tremors, sensory change, speech change, focal weakness, weakness and headaches.  Endo/Heme/Allergies: Negative.  Does not bruise/bleed easily.  Psychiatric/Behavioral: Negative.  Negative for depression and memory loss. The patient is not nervous/anxious and does not have insomnia.   All other systems reviewed and are negative.  Performance status (ECOG): 1   Orders Only on 02/07/2019  Component Date Value Ref Range Status   Ferritin 02/07/2019 71  11 - 307 ng/mL Final   Performed at [facility], [address] [place], [place] [address]   WBC 02/07/2019 7.4  4.0 - 10.5 K/uL Final   RBC 02/07/2019 4.57  3.87 - 5.11 MIL/uL Final   Hemoglobin 02/07/2019 14.7  12.0 - 15.0 g/dL Final   HCT 02/07/2019 41.4  36.0 - 46.0 % Final   MCV 02/07/2019  90.6  80.0 - 100.0 fL Final   MCH 02/07/2019 32.2  26.0 - 34.0 pg Final   MCHC 02/07/2019 35.5  30.0 - 36.0 g/dL Final   RDW 02/07/2019 13.2  11.5 - 15.5 % Final   Platelets 02/07/2019 296  150 - 400 K/uL Final   nRBC 02/07/2019 0.0  0.0 - 0.2 % Final   Neutrophils Relative % 02/07/2019 48  %  Final   Neutro Abs 02/07/2019 3.6  1.7 - 7.7 K/uL Final   Lymphocytes Relative 02/07/2019 42  % Final   Lymphs Abs 02/07/2019 3.1  0.7 - 4.0 K/uL Final   Monocytes Relative 02/07/2019 7  % Final   Monocytes Absolute 02/07/2019 0.5  0.1 - 1.0 K/uL Final   Eosinophils Relative 02/07/2019 2  % Final   Eosinophils Absolute 02/07/2019 0.1  0.0 - 0.5 K/uL Final   Basophils Relative 02/07/2019 1  % Final   Basophils Absolute 02/07/2019 0.1  0.0 - 0.1 K/uL Final   Immature Granulocytes 02/07/2019 0  % Final   Abs Immature Granulocytes 02/07/2019 0.03  0.00 - 0.07 K/uL Final   Performed at [facility], [address] [place], [place] [address]    Assessment:  Molly Peters is a 58 y.o. female with iron deficiency anemia secondary to an upper GI bleed.  She presented with intermittent melena and chest pain.  Hemoglobin nadir was 6.9 on 06/14/2018.  Ferritin was 9, iron saturation 11% and TIBC 393 on 06/20/2018.  She received 1 unit of PRBCs.  Work-up on 07/05/2018 revealed a hematocrit of 31, hemoglobin 10.2, and MCV 70.8.  Ferritin was 7.  B12 was 165 (low).  Folate was 6.3 (> 5.9).  TSH was 1.256.  Ferritin has been followed: 9 on 06/20/2018, 7 on 07/05/2018, 5 on 07/19/2018, 81 on 09/12/2018, 12 on 10/24/2018, and 71 on 02/07/2019.   She received Venofer 200 mg on 07/25/2018, weekly x 3 (08/15/2018 - 08/29/2018), ans weekly x 2 (01/10/2019 - 01/17/2019).  She has B12 deficiency.  She began oral B12 on 07/06/2018.  B12 was 165 on 07/05/2018, 396 on 07/19/2018, 421 (after B12 injection) on 09/12/2018, and 246 on 10/24/2018 (on oral B12).  Folate was 6.3 on 07/05/2018 and 9.2 on  09/12/2018. She began monthly B12 on 08/29/2018 (last 12/19/2018).  EGD on 06/14/2018 revealed esophageal mucosal changes c/w short segment of Barrett's and a non-bleeding gastric ulcers.  Pathology at the GE junction revealed squamocolumnar mucosa with moderate chronic inflammation.  Gastric biopsy revealed no H pylori, dysplasia, or malignancy.    Colonoscopy on 06/15/2018 revealed a 5 mm polyp in the sigmoid colon (tubular adenoma).  Repeat colonoscopy was discussed in 6-12 months due to fair prep.   Chest CT angiogram on 06/22/2018 reveled no pulmonary embolism.  There was underlying emphysematous changes.  There were small LUL pulmonary nodular changes (largest 3 mm).  There was no thoracic adenopathy.    CT angiogram of the abdomen and pelvis revealed no evidence of abdominal aortic aneurysm or dissection. No findings to suggest active GI bleeding on CT. There was non-vascular wall thickening involving the transverse colon extending to the splenic flexure, suspicious for infectious/inflammatory colitis.  There was associated trace pelvic ascites. There was no pneumatosis or free air.  C diff and GI panel by PCR was negative.  She was treated with Flagyl and Augmentin.  BILATERAL mammogram on 07/27/2018 revealed 2 indeterminate masses in the RIGHT breast at the 2:30 and 3 o'clock positions. Follow up breast ultrasounds shows a 6 x 4 x 5 mm oval hypoechoic mass, 1 cm from the nipple, in the 2:30 position. In the 3 o'clock position, again 1 cm from the nipple, the was a 6 x 3 x 3 mm oval hypoechoic mass. LEFT breast ultrasound showed normal fibroglandular tissue at the 10 o'clock position. Ultrasound guided needle biopsy with clip placement on 08/03/2018. Pathology revealed revealed a dilated duct/cyst with  sclerosis of the wall in the 2:30 lesion. Sample negative for atypia and malignancy. Lesion in the 3 o'clock position revealed an intraductal papilloma. Sample negative for atypia and  malignancy.  Right breast lumpectomy on 08/24/2018 revealed an intraductal papilloma with adjacent biopsy change.  There was sclerotic changes suggestive of cyst/duct wall with adjacent biopsy change.  There was duct ectasia and cysts in the tissue between the 2 biopsy sites.  There was no atypia or malignancy.  Symptomatically, she denies any new complaints.  Plan: 1. Review labs from 02/07/2019. 2. RIGHT breast intraductal papilloma s/p excision. Next mammogram on 07/28/2019. 3. Iron deficiency anemia Hemoglobin 14.7 and hematocrit 41.4  MCV 90.6. Ferritin 71. No IV iron needed. 4. B12 deficiency Patient notes B12 po + injections. Discontinue po B12. Continue monthly B12 injections (last 12/19/2018). 5. Pulmonary nodules Chest CT planned on 06/23/2019. 6. Rectal bleeding Patient denies any recurrent bleeding. Colonoscopy on 06/15/2018 - colon prep was sub-optimal.  Discuss plan for follow-up with Dr Bonna Gains (send note). Repeat colonoscopy planned between 12/16/2018 and 06/16/2019. 7.   B12 monthly x 5. 8.   RTC in 3 months for labs (CBC with diff, ferritin) + B12. 9.   RTC in 6 months for MD assessment, labs (CBC with diff, ferritin- day before), and B12.   I discussed the assessment and treatment plan with the patient.  The patient was provided an opportunity to ask questions and all were answered.  The patient agreed with the plan and demonstrated an understanding of the instructions.  The patient was advised to call back or seek an in person evaluation if the symptoms worsen or if the condition fails to improve as anticipated.  I provided 10 minutes (1:55 PM - 2:05 PM) of non-face-to-face time during this encounter.  I provided these services from the [facility] office.   Lequita Asal, MD, PhD  02/09/2019, 1:59 PM

## 2019-02-10 ENCOUNTER — Other Ambulatory Visit: Payer: Self-pay

## 2019-02-10 ENCOUNTER — Inpatient Hospital Stay: Payer: Medicare HMO

## 2019-02-10 VITALS — BP 128/62 | HR 66 | Resp 18

## 2019-02-10 DIAGNOSIS — F419 Anxiety disorder, unspecified: Secondary | ICD-10-CM | POA: Diagnosis not present

## 2019-02-10 DIAGNOSIS — F329 Major depressive disorder, single episode, unspecified: Secondary | ICD-10-CM | POA: Diagnosis not present

## 2019-02-10 DIAGNOSIS — Z79899 Other long term (current) drug therapy: Secondary | ICD-10-CM | POA: Diagnosis not present

## 2019-02-10 DIAGNOSIS — D500 Iron deficiency anemia secondary to blood loss (chronic): Secondary | ICD-10-CM

## 2019-02-10 DIAGNOSIS — F17210 Nicotine dependence, cigarettes, uncomplicated: Secondary | ICD-10-CM | POA: Diagnosis not present

## 2019-02-10 DIAGNOSIS — J449 Chronic obstructive pulmonary disease, unspecified: Secondary | ICD-10-CM | POA: Diagnosis not present

## 2019-02-10 DIAGNOSIS — E538 Deficiency of other specified B group vitamins: Secondary | ICD-10-CM | POA: Diagnosis not present

## 2019-02-10 DIAGNOSIS — D509 Iron deficiency anemia, unspecified: Secondary | ICD-10-CM | POA: Diagnosis not present

## 2019-02-10 DIAGNOSIS — I10 Essential (primary) hypertension: Secondary | ICD-10-CM | POA: Diagnosis not present

## 2019-02-10 MED ORDER — ALPRAZOLAM 1 MG PO TABS: 0.5000 mg | ORAL_TABLET | Freq: Three times a day (TID) | ORAL | 0 refills | Status: DC | PRN

## 2019-02-10 MED ORDER — CYANOCOBALAMIN 1000 MCG/ML IJ SOLN
1000.0000 ug | Freq: Once | INTRAMUSCULAR | Status: AC
  Administered 2019-02-10: 1000 ug via INTRAMUSCULAR
  Filled 2019-02-10: qty 1

## 2019-02-10 NOTE — Telephone Encounter (Signed)
Pt called back pt had fallen asleep and did not hear the phone and pt request refill to [facility].

## 2019-02-10 NOTE — Telephone Encounter (Addendum)
Pt left v/m that she is almost out of alprazolam and request refill sent to [facility]; I was unable to reach pt to see why did not do webex on 02/09/19. Please advise.

## 2019-02-10 NOTE — Addendum Note (Signed)
Addended by: Helene Shoe on: 02/10/2019 12:15 PM   Modules accepted: Orders

## 2019-02-10 NOTE — Addendum Note (Signed)
Addended by: Viviana Simpler I on: 02/10/2019 12:31 PM   Modules accepted: Orders

## 2019-02-10 NOTE — Telephone Encounter (Signed)
Set her up for AMW in the next 3 months or so

## 2019-02-14 ENCOUNTER — Ambulatory Visit (INDEPENDENT_AMBULATORY_CARE_PROVIDER_SITE_OTHER): Payer: Medicare HMO | Admitting: Family Medicine

## 2019-02-14 ENCOUNTER — Encounter: Payer: Self-pay | Admitting: Family Medicine

## 2019-02-14 ENCOUNTER — Telehealth: Payer: Self-pay

## 2019-02-14 ENCOUNTER — Ambulatory Visit
Admission: RE | Admit: 2019-02-14 | Discharge: 2019-02-14 | Disposition: A | Discharge status: Home / Self Care | Payer: Medicare HMO | Source: Ambulatory Visit | Attending: Family Medicine | Admitting: Family Medicine

## 2019-02-14 ENCOUNTER — Other Ambulatory Visit: Payer: Self-pay

## 2019-02-14 VITALS — BP 128/78 | HR 71 | Temp 98.4°F | Ht 58.25 in | Wt 103.6 lb

## 2019-02-14 DIAGNOSIS — R51 Headache: Secondary | ICD-10-CM

## 2019-02-14 DIAGNOSIS — R519 Headache, unspecified: Secondary | ICD-10-CM

## 2019-02-14 NOTE — Telephone Encounter (Signed)
Will see today.  

## 2019-02-14 NOTE — Telephone Encounter (Signed)
Pt calling; pt said started this AM; pt has H/A; pain level 2, nausea; no vomiting, loose stool this AM and prod cough with clear phlegm. Pt does not have a way to take temp but pt feels warm;no SOB. No traveling and no known exposure to +covid virus or flu. Pt has been quarantined for months except pt went to [place] on 02/10/19. No pain except h/a; pt scheduled Doxy.me today at 2:15 with Dr Darnell Level. FYI to Dr Darnell Level.

## 2019-02-14 NOTE — Telephone Encounter (Signed)
Patient called stating she is experiencing  Headache, Nausea, Diarrhea, and Cough (states it is productive at times, clear in color) starting this AM. Patient denies any SOB at this time. Per Dr. Loletha Grayer suggestions, advised patient to contact PCP to make aware of symptoms and to contact our office to advise of recommendation since patient was in our office on Friday, 4/3. Patient verbalizes understanding.

## 2019-02-14 NOTE — Telephone Encounter (Signed)
After talking with patient over the phone I did recommend evaluation at urgent care this afternoon.  Please call tomorrow for an update and to see what urgent care found.

## 2019-02-14 NOTE — Progress Notes (Addendum)
Virtual video visit attempted through [employer]. Pt did not receive text or email invitations so visit continued on telephone.  Patient location: home Provider location: [place] [facility], office If any vitals were documented below, they were collected by patient at home unless specified below.    Interactive audio and video telecommunications were attempted between myself and KRYSTALE RINKENBERGER, however failed due to patient having technical difficulties OR patient did not have access to video capability.  We continued and completed visit with audio only. Patient consented to continue via phone call only.  Time: 2:21pm - 2:38pm    BP 128/78 (BP Location: Right Arm, Patient Position: Sitting, Cuff Size: Normal)   Pulse 71   Temp 98.4 F (36.9 C) (Oral)   Ht 4' 10.25" (1.48 m)   Wt 103 lb 9 oz (47 kg)   SpO2 93%   BMI 21.46 kg/m    CC: HA, nausea  Subjective:    Patient ID: KATHRINE RIEVES, female    DOB: 07-Mar-1961, 58 y.o.   MRN: [id number]  HPI: MENUCHA DICESARE is a 58 y.o. female presenting on 02/14/2019 for Headache (States she woke this morning with severe HA and nausea. Denies fever or vomiting. Took Zofran, helpful with nausea. Says HA is gone.)   1d h/o severe HA "worst headache I've ever had" and "like my head was gonna explode", nausea without vomiting, loose stools x2. Pain throughout entire head that lasted 6-7 hours described as pressure type pain. Treated nausea with zofran with improvement.   No fevers/chills, no photo/phonophobia, vision changes, slurred speech, unilateral numbness/weakness.  More fatigued since headache - took 2 naps today.  Mild residual dull headache currently.  Not on blood thinners.   Doesn't regularly get headaches.   She is hypertensive on hyzaar 100/12.5mg  daily.   She has been receiving B12 shots as well as Venofer IV infusion x2 last month.   Smoker since age 47yo. Endorses chronic smoker's cough no change from previously.  No increased dyspnea. No fever. No known coronavirus exposure.   Patient walked into clinic at 3:20pm - although I clearly advised she go to [facility]. She was evaluated in the office. No known fmhx brain aneurysm.      Relevant past medical, surgical, family and social history reviewed and updated as indicated. Interim medical history since our last visit reviewed. Allergies and medications reviewed and updated. Outpatient Medications Prior to Visit  Medication Sig Dispense Refill  . ALPRAZolam (XANAX) 1 MG tablet Take 0.5-1 tablets (0.5-1 mg total) by mouth 3 (three) times daily as needed for anxiety. 90 tablet 0  . Cyanocobalamin (VITAMIN B-12 PO) Take 1 tablet by mouth daily.    . cyclobenzaprine (FLEXERIL) 5 MG tablet Take 1-2 tablets 3 times daily as needed 20 tablet 0  . gabapentin (NEURONTIN) 300 MG capsule TAKE 1 CAPSULE TWICE DAILY AND TAKE 2 CAPSULES AT BEDTIME (Patient taking differently: Take 300 mg by mouth 4 (four) times daily. ) 360 capsule 0  . lamoTRIgine (LAMICTAL) 100 MG tablet TAKE 1 TABLET TWICE DAILY 180 tablet 1  . losartan-hydrochlorothiazide (HYZAAR) 100-12.5 MG tablet TAKE 1 TABLET EVERY DAY 90 tablet 1  . nicotine (NICODERM CQ - DOSED IN MG/24 HOURS) 14 mg/24hr patch Place 1 patch (14 mg total) onto the skin daily. 28 patch 0  . ondansetron (ZOFRAN ODT) 4 MG disintegrating tablet Take 1 tablet (4 mg total) by mouth every 8 (eight) hours as needed for nausea or vomiting. [place]  tablet 0  . PARoxetine (PAXIL) 20 MG tablet Take 1 tablet (20 mg total) by mouth daily. 90 tablet 0  . pantoprazole (PROTONIX) 40 MG tablet Take 1 tablet (40 mg total) by mouth 2 (two) times daily. 180 tablet 0   No facility-administered medications prior to visit.      Per HPI unless specifically indicated in ROS section below Review of Systems Objective:    BP 128/78 (BP Location: Right Arm, Patient Position: Sitting, Cuff Size: Normal)   Pulse 71   Temp 98.4 F (36.9 C)  (Oral)   Ht 4' 10.25" (1.48 m)   Wt 103 lb 9 oz (47 kg)   SpO2 93%   BMI 21.46 kg/m   Wt Readings from Last 3 Encounters:  02/14/19 103 lb 9 oz (47 kg)  12/06/18 101 lb (45.8 kg)  10/24/18 97 lb 9 oz (44.3 kg)    Physical Exam Vitals signs and nursing note reviewed.  Constitutional:      General: She is not in acute distress.    Appearance: Normal appearance. She is well-developed. She is not ill-appearing.  HENT:     Head: Normocephalic and atraumatic.     Right Ear: Hearing normal.     Left Ear: Hearing normal.     Nose: Nose normal. No congestion.     Mouth/Throat:     Lips: Pink.     Mouth: Mucous membranes are moist.     Pharynx: Oropharynx is clear. Uvula midline. No oropharyngeal exudate or posterior oropharyngeal erythema.     Tonsils: No tonsillar abscesses.  Eyes:     General: No scleral icterus.    Extraocular Movements: Extraocular movements intact.     Conjunctiva/sclera: Conjunctivae normal.     Pupils: Pupils are equal, round, and reactive to light.  Neck:     Musculoskeletal: Normal range of motion and neck supple.     Vascular: No carotid bruit.     Comments: FROM of neck  Cardiovascular:     Rate and Rhythm: Normal rate and regular rhythm.     Pulses: Normal pulses.     Heart sounds: Normal heart sounds. No murmur.  Pulmonary:     Effort: Pulmonary effort is normal. No respiratory distress.     Breath sounds: Normal breath sounds. No wheezing, rhonchi or rales.  Musculoskeletal:     Right lower leg: No edema.     Left lower leg: No edema.  Skin:    General: Skin is warm and dry.     Findings: No rash.  Neurological:     General: No focal deficit present.     Mental Status: She is alert.     Cranial Nerves: Cranial nerves are intact. No cranial nerve deficit.     Sensory: No sensory deficit.     Motor: No weakness.     Coordination: Coordination is intact. Romberg sign negative. Finger-Nose-Finger Test normal.     Gait: Gait is intact. Gait  normal.     Comments:  CN 2-12 intact Station and gait intact FTN intact EOMI No pronator drift No evident papilledema on limited fundoscopic exam  Psychiatric:        Mood and Affect: Mood normal.         Results for orders placed or performed in visit on 02/07/19  Ferritin  Result Value Ref Range   Ferritin 71 11 - 307 ng/mL  CBC with Differential  Result Value Ref Range   WBC 7.4 4.0 - 10.5 K/uL  RBC 4.57 3.87 - 5.11 MIL/uL   Hemoglobin 14.7 12.0 - 15.0 g/dL   HCT 41.4 36.0 - 46.0 %   MCV 90.6 80.0 - 100.0 fL   MCH 32.2 26.0 - 34.0 pg   MCHC 35.5 30.0 - 36.0 g/dL   RDW 13.2 11.5 - 15.5 %   Platelets 296 150 - 400 K/uL   nRBC 0.0 0.0 - 0.2 %   Neutrophils Relative % 48 %   Neutro Abs 3.6 1.7 - 7.7 K/uL   Lymphocytes Relative 42 %   Lymphs Abs 3.1 0.7 - 4.0 K/uL   Monocytes Relative 7 %   Monocytes Absolute 0.5 0.1 - 1.0 K/uL   Eosinophils Relative 2 %   Eosinophils Absolute 0.1 0.0 - 0.5 K/uL   Basophils Relative 1 %   Basophils Absolute 0.1 0.0 - 0.1 K/uL   Immature Granulocytes 0 %   Abs Immature Granulocytes 0.03 0.00 - 0.07 K/uL   Assessment & Plan:   Problem List Items Addressed This Visit    Headache, acute - Primary    Acute severe headache this morning that lasted 6-7 hours and has settled down some however given description of headache as "worst of my life", age >75 and no significant headache history, I do recommend further evaluation for a neurologic exam and set of vital signs - I have asked her to go to local [facility] to be evaluated this afternoon. She lives in [place] and would be unable to come into office for evaluation this afternoon. Will call tomorrow to follow up on patient.       Relevant Orders   CT Head Wo Contrast      ==>ADDENDUM: Patient evaluated in the office with normal vital signs, nonfocal neurological exam. However given story, I still think she merits further evaluation with head imaging - will order STAT head CT to r/o bleed.  Pt agrees with plan.   No orders of the defined types were placed in this encounter.  Orders Placed This Encounter  Procedures  . CT Head Wo Contrast    Standing Status:   Future    Standing Expiration Date:   05/15/2020    Order Specific Question:   Is patient pregnant?    Answer:   No    Order Specific Question:   Preferred imaging location?    Answer:   [facility]    Order Specific Question:   Call Results- Best Contact Number?    Answer:   [phone]....Marland Kitchenhold pt    Order Specific Question:   Radiology Contrast Protocol - do NOT remove file path    Answer:   [email or web address]    Follow up plan: No follow-ups on file.  Ria Bush, MD

## 2019-02-14 NOTE — Patient Instructions (Addendum)
Exam looking ok today as were vital signs. I do want to proceed with CT of head. See Rosaria Ferries to get this set up.  We will be in touch with results.

## 2019-02-14 NOTE — Addendum Note (Signed)
Addended by: Ria Bush on: 02/14/2019 04:15 PM   Modules accepted: Orders, Level of Service

## 2019-02-14 NOTE — Assessment & Plan Note (Addendum)
Acute severe headache this morning that lasted 6-7 hours and has settled down some however given description of headache as "worst of my life", age >76 and no significant headache history, I do recommend further evaluation for a neurologic exam and set of vital signs - I have asked her to go to local [facility] to be evaluated this afternoon. She lives in [place] and would be unable to come into office for evaluation this afternoon. Will call tomorrow to follow up on patient.

## 2019-02-14 NOTE — Telephone Encounter (Signed)
Pt walked into office today. Evaluated by Dr. Darnell Level.

## 2019-02-15 ENCOUNTER — Ambulatory Visit: Payer: Medicare HMO | Admitting: Family Medicine

## 2019-02-15 ENCOUNTER — Ambulatory Visit: Payer: Medicare HMO | Admitting: Internal Medicine

## 2019-02-15 ENCOUNTER — Telehealth: Payer: Self-pay

## 2019-02-15 NOTE — Telephone Encounter (Signed)
SE NOTE: All timestamps contained within this report are represented as [place] Standard Time. CONFIDENTIALTY NOTICE: This fax transmission is intended only for the addressee. It contains information that is legally privileged, confidential or otherwise protected from use or disclosure. If you are not the intended recipient, you are strictly prohibited from reviewing, disclosing, copying using or disseminating any of this information or taking any action in reliance on or regarding this information. If you have received this fax in error, please notify us immediately by telephone so that we can arrange for its return to [place]. Phone: [phone], Toll-Free: [phone], Fax: [phone] Page: 1 of 1 Call Id: [id number] [place] Night - Client Nonclinical Telephone Record [place] Night - Client Client Site [place] Physician Ria Bush - MD Contact Type Call Who Is Calling Physician / Provider / Hospital Call Type Provider Call [facility] Page Now Reason for Call Request to speak to Physician Initial Comment [facility] Out[facility]. States patient is still at facility, dr put a pt hold after doing scans. Wants to release her. Additional Comment Patient Name Yuette Putnam Patient DOB [birth year] Requesting Provider [place] Physician Number [phone] Facility Name [facility] CT Scan Paging DoctorName Phone DateTime Result/Outcome Message Type Notes Billey Gosling - MD [phone] 02/14/2019 5:19:53 PM Called On Call Provider - Reached Doctor Paged Billey Gosling - MD 02/14/2019 5:21:43 PM Spoke with On Call - General Message Result Call Closed By: Carleene Mains Transaction Date/Time: 02/14/2019 5:07:46 PM (ET)

## 2019-02-15 NOTE — Telephone Encounter (Signed)
Issue resolved.

## 2019-03-10 ENCOUNTER — Inpatient Hospital Stay: Payer: Medicare HMO

## 2019-03-14 ENCOUNTER — Telehealth: Payer: Self-pay

## 2019-03-14 NOTE — Telephone Encounter (Signed)
LVM to notify patient that I was calling to review her medications and chart with her prior to her tomorrows visit.  Will call her first thing in the am as she is scheduled for 9am with Dr. Bonna Gains.  Thanks [employer]

## 2019-03-15 ENCOUNTER — Encounter: Payer: Self-pay | Admitting: Gastroenterology

## 2019-03-15 ENCOUNTER — Ambulatory Visit: Payer: Medicare HMO | Admitting: Gastroenterology

## 2019-03-15 ENCOUNTER — Ambulatory Visit: Payer: Self-pay | Admitting: Gastroenterology

## 2019-03-15 ENCOUNTER — Telehealth: Payer: Self-pay | Admitting: Gastroenterology

## 2019-03-15 NOTE — Telephone Encounter (Signed)
Patient just called in stating she was on the phone with Dr Bonna Gains & was disconnected.

## 2019-03-15 NOTE — Progress Notes (Signed)
Pt did not check in online (no show) at her scheduled time. I called the pt for a phone visit instead and she answered the phone, and acknowledged that it is Molly Peters. However, we could not hear each other immediately after this. I disconnected and called her again and it went to voicemail. I left a voicemail requesting a call back for her appointment.

## 2019-03-21 ENCOUNTER — Telehealth: Payer: Self-pay

## 2019-03-21 ENCOUNTER — Ambulatory Visit (INDEPENDENT_AMBULATORY_CARE_PROVIDER_SITE_OTHER): Payer: Medicare HMO | Admitting: Gastroenterology

## 2019-03-21 DIAGNOSIS — Z86010 Personal history of colonic polyps: Secondary | ICD-10-CM

## 2019-03-21 DIAGNOSIS — Z8711 Personal history of peptic ulcer disease: Secondary | ICD-10-CM

## 2019-03-21 DIAGNOSIS — Z8719 Personal history of other diseases of the digestive system: Secondary | ICD-10-CM | POA: Diagnosis not present

## 2019-03-21 NOTE — Telephone Encounter (Signed)
Left message that I would call back later to schedule procedures.

## 2019-03-21 NOTE — Progress Notes (Signed)
Molly Antigua, MD [address]  [place]  [place], [place] [address]  Main: [phone]  Fax: [phone]   Primary Care Physician: Venia Carbon, MD  Virtual Visit via Telephone Note  I connected with patient on 03/21/19 at  9:30 AM EDT by telephone and verified that I am speaking with the correct person using two identifiers.   I discussed the limitations, risks, security and privacy concerns of performing an evaluation and management service by telephone and the availability of in person appointments. I also discussed with the patient that there may be a patient responsible charge related to this service. The patient expressed understanding and agreed to proceed.  Location of Patient: Home Location of Provider: Home Persons involved: Patient and provider only   History of Present Illness: Chief Complaint  Patient presents with  . Follow-up Iron Deficiency Anemia     HPI: Molly Peters is a 58 y.o. female with history of gastric ulcers here for follow-up. The patient denies abdominal or flank pain, anorexia, nausea or vomiting, dysphagia, change in bowel habits or black or bloody stools or weight loss.  Last seen in October 2019 and at that time had 5 days of hematochezia, [place] and having nausea and vomiting with diarrhea with CT showing thickening of the transverse colon to the splenic flexure during an ER visit.  Patient was given outpatient antibiotics and symptoms resolved after that and diagnosis was infectious colitis.  Symptoms have not reoccurred since then.  In addition, last colonoscopy was in August 2019 with 5 mm tubular adenoma polyp removed.  Therefore, CT findings were not consistent with a colon malignancy given that the colonoscopy was just a few months before the CT itself.  Also had an EGD in August 2019 for melena and anemia, which showed gastric ulcers at the time.  Current Outpatient Medications   Medication Sig Dispense Refill  . ALPRAZolam (XANAX) 1 MG tablet Take 0.5-1 tablets (0.5-1 mg total) by mouth 3 (three) times daily as needed for anxiety. 90 tablet 0  . cyclobenzaprine (FLEXERIL) 5 MG tablet Take 1-2 tablets 3 times daily as needed (Patient not taking: Reported on 03/15/2019) 20 tablet 0  . gabapentin (NEURONTIN) 300 MG capsule TAKE 1 CAPSULE TWICE DAILY AND TAKE 2 CAPSULES AT BEDTIME (Patient taking differently: Take 300 mg by mouth 4 (four) times daily. ) 360 capsule 0  . lamoTRIgine (LAMICTAL) 100 MG tablet TAKE 1 TABLET TWICE DAILY 180 tablet 1  . losartan-hydrochlorothiazide (HYZAAR) 100-12.5 MG tablet TAKE 1 TABLET EVERY DAY 90 tablet 1  . nicotine (NICODERM CQ - DOSED IN MG/24 HOURS) 14 mg/24hr patch Place 1 patch (14 mg total) onto the skin daily. (Patient not taking: Reported on 03/15/2019) 28 patch 0  . ondansetron (ZOFRAN ODT) 4 MG disintegrating tablet Take 1 tablet (4 mg total) by mouth every 8 (eight) hours as needed for nausea or vomiting. 20 tablet 0  . PARoxetine (PAXIL) 20 MG tablet Take 1 tablet (20 mg total) by mouth daily. 90 tablet 0   No current facility-administered medications for this visit.     Allergies as of 03/21/2019 - Review Complete 03/15/2019  Allergen Reaction Noted  . Carbamazepine Hives 08/26/2015  . Divalproex sodium Swelling and Other (See Comments) 01/01/2016  . Clonazepam Other (See Comments) 08/12/2005  . Diphenhydramine hcl Rash 08/26/2015  . Tiotropium bromide monohydrate Other (See Comments)   . Vicodin [hydrocodone-acetaminophen] Itching 11/15/2014    Review of Systems:  All systems reviewed and negative except where noted in HPI.   Observations/Objective:  Labs: CMP     Component Value Date/Time   NA 138 08/24/2018 0834   K 4.9 08/24/2018 0834   CL 99 08/19/2018 1015   CO2 30 08/19/2018 1015   GLUCOSE 83 08/24/2018 0834   BUN 7 08/19/2018 1015   CREATININE 0.87 08/19/2018 1015   CALCIUM 9.1 08/19/2018 1015   PROT  7.3 08/08/2018 1514   ALBUMIN 4.3 08/08/2018 1514   AST 19 08/08/2018 1514   ALT 9 08/08/2018 1514   ALKPHOS 79 08/08/2018 1514   BILITOT 0.7 08/08/2018 1514   GFRNONAA >60 08/19/2018 1015   GFRAA >60 08/19/2018 1015   Lab Results  Component Value Date   WBC 7.4 02/07/2019   HGB 14.7 02/07/2019   HCT 41.4 02/07/2019   MCV 90.6 02/07/2019   PLT 296 02/07/2019    Imaging Studies: No results found.  Assessment and Plan:   Molly Peters is a 58 y.o. y/o female with history of gastric ulcers here for follow-up  Assessment and Plan: Given previous history of gastric ulcers, and since we are planning on repeating colonoscopy as previously planned, we discussed EGD with the colonoscopy as well.  This would allow [place] to evaluate if the ulcers are completely healed.  Alternative of not doing the EGD since she is asymptomatic was also discussed.  Patient would like to proceed with EGD along with her colonoscopy as well.  A colonoscopy will be scheduled due to fair prep seen on last procedure and colonoscopy was planned to be done in 6 to 12 months after her last colonoscopy in August 2019.  In addition it would also allow [place] to confirm that the thickening seen on CT in August 2019, is not due to an underlying lesion.  And was just due to infectious colitis which has resolved.  I have discussed alternative options, risks & benefits,  which include, but are not limited to, bleeding, infection, perforation,respiratory complication & drug reaction.  The patient agrees with this plan & written consent will be obtained.     Follow Up Instructions: Follow-up in 3 months   I discussed the assessment and treatment plan with the patient. The patient was provided an opportunity to ask questions and all were answered. The patient agreed with the plan and demonstrated an understanding of the instructions.   The patient was advised to call back or seek an in-person evaluation if the symptoms worsen  or if the condition fails to improve as anticipated.  I provided 15 minutes of non-face-to-face time during this encounter.   Virgel Manifold, MD  Speech recognition software was used to dictate this note.

## 2019-03-21 NOTE — Telephone Encounter (Signed)
Unable to contact patient this am to prepare her for her televisit with Dr. Bonna Gains.  I did leave her a voice message to call office ASAP to prepare her chart for her visit this am.  Thanks Sharyn Lull

## 2019-03-21 NOTE — Telephone Encounter (Signed)
Left message that I was calling to schedule procedures and a my chart sign up was sent and would she please sign up for this, so we can get her information to her in a more timely manner.

## 2019-03-27 ENCOUNTER — Telehealth: Payer: Self-pay | Admitting: Internal Medicine

## 2019-03-27 ENCOUNTER — Other Ambulatory Visit: Payer: Self-pay | Admitting: Internal Medicine

## 2019-03-27 MED ORDER — LAMOTRIGINE 100 MG PO TABS: 100.0000 mg | ORAL_TABLET | Freq: Two times a day (BID) | ORAL | 1 refills | Status: DC

## 2019-03-27 MED ORDER — PAROXETINE HCL 20 MG PO TABS: 20.0000 mg | ORAL_TABLET | Freq: Every day | ORAL | 1 refills | Status: DC

## 2019-03-27 NOTE — Telephone Encounter (Signed)
Best number [phone] Pt called needing to get refills on  Paroxetine Lamotrigine  Pt changed drugstore and need this sent to  [facility]  Pt has approx 1 week left on meds

## 2019-03-27 NOTE — Telephone Encounter (Signed)
Rxs sent electronically.  

## 2019-03-27 NOTE — Telephone Encounter (Signed)
Last filled 02-10-19 #90 Last OV 02-14-19 Next OV 06-20-19 [facility]  Forward to [facility] in Dr Alla German absence

## 2019-03-28 ENCOUNTER — Telehealth: Payer: Self-pay | Admitting: *Deleted

## 2019-03-28 NOTE — Telephone Encounter (Signed)
Contacted and rescheduled for lung screening scan. See prior note for eligibility information. 

## 2019-03-30 ENCOUNTER — Ambulatory Visit
Admission: RE | Admit: 2019-03-30 | Discharge: 2019-03-30 | Disposition: A | Discharge status: Home / Self Care | Payer: Medicare HMO | Source: Ambulatory Visit | Attending: Nurse Practitioner | Admitting: Nurse Practitioner

## 2019-03-30 ENCOUNTER — Other Ambulatory Visit: Payer: Self-pay

## 2019-03-30 DIAGNOSIS — F17210 Nicotine dependence, cigarettes, uncomplicated: Secondary | ICD-10-CM | POA: Diagnosis not present

## 2019-03-30 DIAGNOSIS — I700 Atherosclerosis of aorta: Secondary | ICD-10-CM | POA: Diagnosis not present

## 2019-03-30 DIAGNOSIS — Z122 Encounter for screening for malignant neoplasm of respiratory organs: Secondary | ICD-10-CM

## 2019-03-30 DIAGNOSIS — J439 Emphysema, unspecified: Secondary | ICD-10-CM | POA: Diagnosis not present

## 2019-04-04 ENCOUNTER — Encounter: Payer: Self-pay | Admitting: *Deleted

## 2019-04-05 ENCOUNTER — Other Ambulatory Visit: Payer: Self-pay

## 2019-04-05 DIAGNOSIS — R935 Abnormal findings on diagnostic imaging of other abdominal regions, including retroperitoneum: Secondary | ICD-10-CM

## 2019-04-05 DIAGNOSIS — Z8711 Personal history of peptic ulcer disease: Secondary | ICD-10-CM

## 2019-04-05 DIAGNOSIS — D508 Other iron deficiency anemias: Secondary | ICD-10-CM

## 2019-04-05 DIAGNOSIS — Z8719 Personal history of other diseases of the digestive system: Secondary | ICD-10-CM

## 2019-04-05 MED ORDER — NA SULFATE-K SULFATE-MG SULF 17.5-3.13-1.6 GM/177ML PO SOLN: 1.0000 | Freq: Once | ORAL | 0 refills | Status: AC

## 2019-04-05 NOTE — Telephone Encounter (Signed)
I spoke with pt and colonoscopy and EGD are scheduled for 6/25 at [facility] (per pt request). Prep instructions went over and sent through [place]. Will send rx to [facility] for suprep. Pt also notified that PAT will be contacting her for instructions for the Covid testing prior to her procedure. To contact office for any questions or may use my chart.

## 2019-04-06 ENCOUNTER — Other Ambulatory Visit: Payer: Self-pay | Admitting: Internal Medicine

## 2019-04-06 ENCOUNTER — Inpatient Hospital Stay: Payer: Medicare HMO | Attending: Hematology and Oncology

## 2019-04-07 ENCOUNTER — Inpatient Hospital Stay: Payer: Medicare HMO

## 2019-04-27 ENCOUNTER — Telehealth: Payer: Self-pay | Admitting: Gastroenterology

## 2019-04-27 NOTE — Telephone Encounter (Signed)
Patient is scheduled for her colonoscopy on 05/04/19 with Dr. Bonna Gains.  She wanted to inform us that she has had exposure to [facility].  Her test date is on 05/31/19 for COVID, and she said she has informed Pre-Admit testing in advance as well.  Informed her she will be notified by phone if her test is positive.    Thanks [employer]

## 2019-04-27 NOTE — Telephone Encounter (Signed)
Pt is calling she states she has a procedure coming up and has been exposed to [place] she does have  A Covid19 test on Monday before procedure she just wanted to let us know.

## 2019-05-01 ENCOUNTER — Other Ambulatory Visit
Admission: RE | Admit: 2019-05-01 | Discharge: 2019-05-01 | Disposition: A | Discharge status: Home / Self Care | Payer: Medicare HMO | Source: Ambulatory Visit | Attending: Gastroenterology | Admitting: Gastroenterology

## 2019-05-01 DIAGNOSIS — Z1159 Encounter for screening for other viral diseases: Secondary | ICD-10-CM | POA: Insufficient documentation

## 2019-05-02 LAB — NOVEL CORONAVIRUS, NAA (HOSP ORDER, SEND-OUT TO REF LAB; TAT 18-24 HRS): SARS-CoV-2, NAA: NOT DETECTED

## 2019-05-03 ENCOUNTER — Encounter: Payer: Self-pay | Admitting: *Deleted

## 2019-05-04 ENCOUNTER — Ambulatory Visit: Payer: Medicare HMO | Admitting: Anesthesiology

## 2019-05-04 ENCOUNTER — Other Ambulatory Visit: Payer: Self-pay

## 2019-05-04 ENCOUNTER — Encounter: Payer: Self-pay | Admitting: Anesthesiology

## 2019-05-04 ENCOUNTER — Ambulatory Visit
Admission: RE | Admit: 2019-05-04 | Discharge: 2019-05-04 | Disposition: A | Discharge status: Home / Self Care | Payer: Medicare HMO | Attending: Gastroenterology | Admitting: Gastroenterology

## 2019-05-04 ENCOUNTER — Encounter
Admission: RE | Disposition: A | Discharge status: Home / Self Care | Payer: Self-pay | Source: Home / Self Care | Attending: Gastroenterology

## 2019-05-04 DIAGNOSIS — D649 Anemia, unspecified: Secondary | ICD-10-CM | POA: Diagnosis not present

## 2019-05-04 DIAGNOSIS — Z8719 Personal history of other diseases of the digestive system: Secondary | ICD-10-CM

## 2019-05-04 DIAGNOSIS — K3189 Other diseases of stomach and duodenum: Secondary | ICD-10-CM | POA: Diagnosis not present

## 2019-05-04 DIAGNOSIS — K2950 Unspecified chronic gastritis without bleeding: Secondary | ICD-10-CM | POA: Diagnosis not present

## 2019-05-04 DIAGNOSIS — F419 Anxiety disorder, unspecified: Secondary | ICD-10-CM | POA: Insufficient documentation

## 2019-05-04 DIAGNOSIS — F329 Major depressive disorder, single episode, unspecified: Secondary | ICD-10-CM | POA: Insufficient documentation

## 2019-05-04 DIAGNOSIS — M797 Fibromyalgia: Secondary | ICD-10-CM | POA: Diagnosis not present

## 2019-05-04 DIAGNOSIS — F17210 Nicotine dependence, cigarettes, uncomplicated: Secondary | ICD-10-CM | POA: Diagnosis not present

## 2019-05-04 DIAGNOSIS — Z8541 Personal history of malignant neoplasm of cervix uteri: Secondary | ICD-10-CM | POA: Diagnosis not present

## 2019-05-04 DIAGNOSIS — Z8711 Personal history of peptic ulcer disease: Secondary | ICD-10-CM

## 2019-05-04 DIAGNOSIS — I10 Essential (primary) hypertension: Secondary | ICD-10-CM | POA: Insufficient documentation

## 2019-05-04 DIAGNOSIS — Z860100 Personal history of colon polyps, unspecified: Secondary | ICD-10-CM

## 2019-05-04 DIAGNOSIS — Z79899 Other long term (current) drug therapy: Secondary | ICD-10-CM | POA: Diagnosis not present

## 2019-05-04 DIAGNOSIS — K644 Residual hemorrhoidal skin tags: Secondary | ICD-10-CM | POA: Diagnosis not present

## 2019-05-04 DIAGNOSIS — Z96612 Presence of left artificial shoulder joint: Secondary | ICD-10-CM | POA: Insufficient documentation

## 2019-05-04 DIAGNOSIS — R933 Abnormal findings on diagnostic imaging of other parts of digestive tract: Secondary | ICD-10-CM | POA: Diagnosis not present

## 2019-05-04 DIAGNOSIS — K259 Gastric ulcer, unspecified as acute or chronic, without hemorrhage or perforation: Secondary | ICD-10-CM

## 2019-05-04 DIAGNOSIS — K289 Gastrojejunal ulcer, unspecified as acute or chronic, without hemorrhage or perforation: Secondary | ICD-10-CM

## 2019-05-04 DIAGNOSIS — J449 Chronic obstructive pulmonary disease, unspecified: Secondary | ICD-10-CM | POA: Diagnosis not present

## 2019-05-04 DIAGNOSIS — K5730 Diverticulosis of large intestine without perforation or abscess without bleeding: Secondary | ICD-10-CM | POA: Insufficient documentation

## 2019-05-04 DIAGNOSIS — D125 Benign neoplasm of sigmoid colon: Secondary | ICD-10-CM | POA: Diagnosis not present

## 2019-05-04 DIAGNOSIS — Z96611 Presence of right artificial shoulder joint: Secondary | ICD-10-CM | POA: Diagnosis not present

## 2019-05-04 DIAGNOSIS — D508 Other iron deficiency anemias: Secondary | ICD-10-CM

## 2019-05-04 DIAGNOSIS — K635 Polyp of colon: Secondary | ICD-10-CM

## 2019-05-04 DIAGNOSIS — R569 Unspecified convulsions: Secondary | ICD-10-CM | POA: Diagnosis not present

## 2019-05-04 DIAGNOSIS — R935 Abnormal findings on diagnostic imaging of other abdominal regions, including retroperitoneum: Secondary | ICD-10-CM

## 2019-05-04 DIAGNOSIS — K319 Disease of stomach and duodenum, unspecified: Secondary | ICD-10-CM | POA: Diagnosis not present

## 2019-05-04 DIAGNOSIS — K5790 Diverticulosis of intestine, part unspecified, without perforation or abscess without bleeding: Secondary | ICD-10-CM | POA: Diagnosis not present

## 2019-05-04 HISTORY — PX: COLONOSCOPY WITH PROPOFOL: SHX5780

## 2019-05-04 HISTORY — PX: ESOPHAGOGASTRODUODENOSCOPY (EGD) WITH PROPOFOL: SHX5813

## 2019-05-04 SURGERY — COLONOSCOPY WITH PROPOFOL
Anesthesia: General

## 2019-05-04 MED ORDER — ONDANSETRON HCL 4 MG/2ML IJ SOLN
INTRAMUSCULAR | Status: AC
  Filled 2019-05-04: qty 2

## 2019-05-04 MED ORDER — BUTAMBEN-TETRACAINE-BENZOCAINE 2-2-14 % EX AERO
INHALATION_SPRAY | CUTANEOUS | Status: DC | PRN
  Administered 2019-05-04: 2 via TOPICAL

## 2019-05-04 MED ORDER — EPHEDRINE SULFATE 50 MG/ML IJ SOLN
INTRAMUSCULAR | Status: AC
  Filled 2019-05-04: qty 1

## 2019-05-04 MED ORDER — LIDOCAINE HCL (CARDIAC) PF 100 MG/5ML IV SOSY
PREFILLED_SYRINGE | INTRAVENOUS | Status: DC | PRN
  Administered 2019-05-04: 50 mg via INTRAVENOUS

## 2019-05-04 MED ORDER — EPHEDRINE SULFATE 50 MG/ML IJ SOLN
INTRAMUSCULAR | Status: DC | PRN
  Administered 2019-05-04 (×2): 5 mg via INTRAVENOUS

## 2019-05-04 MED ORDER — SODIUM CHLORIDE 0.9 % IV SOLN
INTRAVENOUS | Status: DC
  Administered 2019-05-04: 08:00:00 via INTRAVENOUS

## 2019-05-04 MED ORDER — LIDOCAINE HCL (PF) 2 % IJ SOLN
INTRAMUSCULAR | Status: AC
  Filled 2019-05-04: qty 10

## 2019-05-04 MED ORDER — MIDAZOLAM HCL 2 MG/2ML IJ SOLN
INTRAMUSCULAR | Status: DC | PRN
  Administered 2019-05-04: 2 mg via INTRAVENOUS

## 2019-05-04 MED ORDER — PROPOFOL 500 MG/50ML IV EMUL
INTRAVENOUS | Status: DC | PRN
  Administered 2019-05-04: 100 ug/kg/min via INTRAVENOUS

## 2019-05-04 MED ORDER — MIDAZOLAM HCL 2 MG/2ML IJ SOLN
INTRAMUSCULAR | Status: AC
  Filled 2019-05-04: qty 2

## 2019-05-04 MED ORDER — PANTOPRAZOLE SODIUM 20 MG PO TBEC: 20.0000 mg | DELAYED_RELEASE_TABLET | Freq: Every day | ORAL | 1 refills | Status: DC

## 2019-05-04 MED ORDER — PROPOFOL 500 MG/50ML IV EMUL
INTRAVENOUS | Status: AC
  Filled 2019-05-04: qty 50

## 2019-05-04 MED ORDER — ONDANSETRON HCL 4 MG/2ML IJ SOLN
INTRAMUSCULAR | Status: DC | PRN
  Administered 2019-05-04: 4 mg via INTRAVENOUS

## 2019-05-04 MED ORDER — BUTAMBEN-TETRACAINE-BENZOCAINE 2-2-14 % EX AERO
INHALATION_SPRAY | CUTANEOUS | Status: AC
  Filled 2019-05-04: qty 5

## 2019-05-04 MED ORDER — FENTANYL CITRATE (PF) 100 MCG/2ML IJ SOLN
INTRAMUSCULAR | Status: AC
  Filled 2019-05-04: qty 2

## 2019-05-04 MED ORDER — FENTANYL CITRATE (PF) 100 MCG/2ML IJ SOLN
INTRAMUSCULAR | Status: DC | PRN
  Administered 2019-05-04 (×2): 25 ug via INTRAVENOUS
  Administered 2019-05-04: 50 ug via INTRAVENOUS

## 2019-05-04 NOTE — H&P (Signed)
[name], MD [address] [place], [place], [place], [address] [address], [place], [place], [place], [address] Phone: [phone]  Fax: [phone]  Primary Care Physician:  Venia Carbon, MD   Pre-Procedure History & Physical: HPI:  Molly Peters is a 58 y.o. female is here for a colonoscopy and EGD.   Past Medical History:  Diagnosis Date  . Anxiety   . Cervical cancer ([place]) 1980's  . Chronic back pain 11/12/2009   Qualifier: Diagnosis of  By: Maxie Better FNP, Rosalita Levan   . COPD (chronic obstructive pulmonary disease) (HCC)    states SOB with ADLs; no O2 use; able to speak in complete sentences without SOB(11/15/2014)  . Depression   . Difficulty swallowing pills    s/p cervical fusion  . Family history of adverse reaction to anesthesia    pt's mother and sister have hx. of post-op N/V  . Fibromyalgia   . GERD (gastroesophageal reflux disease)   . Heart murmur   . History of gastric ulcer   . Hypertension    states under control with med., has been on med. x 1-2 yr.  . IBS (irritable bowel syndrome)   . Internal hemorrhoid    states has had intermittent bright red bleeding with BM (11/15/2014)  . Intraductal papilloma of breast, right 08/08/2018  . Limited joint range of motion    neck - s/p cervical fusion  . Localized primary osteoarthritis of right shoulder region 11/23/2014  . Osteoarthritis of left shoulder 07/05/2015  . Osteoarthritis of right shoulder 11/2014  . Pneumonia   . Seizures ([place])    last seizure 2010  . Short-term memory loss   . TMJ syndrome   . Valvular heart disease    Initial workup a number of years ago at [facility].  Echo (10/2009) showed EF 60-65%,      normal LV size, moderate aortic insufficiency with a trileaflet aortic valve and normal aortic root size.  Mild      mitral regurgitation.   . Wears dentures    lower    Past Surgical History:  Procedure Laterality Date  . ABDOMINAL HYSTERECTOMY     partial  . ANTERIOR  CERVICAL DECOMP/DISCECTOMY FUSION  02/06/2011   C4-5, C5-6, C6-7  . BREAST BIOPSY Bilateral 10+ yrs ago   neg  . BREAST BIOPSY Right 08/03/2018   2 areas bx, path pending  . BREAST LUMPECTOMY Bilateral 1989   x 3 - benign  . BREAST LUMPECTOMY Right 08/24/2018   Procedure: BREAST LUMPECTOMY WITH ULTRASOUND IN OR;  Surgeon: Vickie Epley, MD;  Location: [facility] ORS;  Service: General;  Laterality: Right;  . CARDIAC CATHETERIZATION  1995  . CESAREAN SECTION     x 2  . COLONOSCOPY  09/28/2008  . COLONOSCOPY N/A 06/15/2018   Procedure: COLONOSCOPY;  Surgeon: Virgel Manifold, MD;  Location: [facility] ENDOSCOPY;  Service: Endoscopy;  Laterality: N/A;  . ESOPHAGOGASTRODUODENOSCOPY  09/28/2008  . ESOPHAGOGASTRODUODENOSCOPY N/A 06/14/2018   Procedure: ESOPHAGOGASTRODUODENOSCOPY (EGD);  Surgeon: Virgel Manifold, MD;  Location: [facility] ENDOSCOPY;  Service: Endoscopy;  Laterality: N/A;  . HARDWARE REMOVAL  11/17/2006   L5-S1  . LAMINECTOMY WITH POSTERIOR LATERAL ARTHRODESIS LEVEL 3  12/06/2009   with synovial cyst resection L3-4 bilat.  Marland Kitchen LAMINECTOMY WITH POSTERIOR LATERAL ARTHRODESIS LEVEL 3  11/17/2006   L4-5  . NM MYOVIEW LTD     Lexiscan myoview (10/2009): EF 77%, normal wall motion, normal perfusion.   Marland Kitchen SHOULDER ARTHROSCOPY WITH DEBRIDEMENT AND  BICEP TENDON REPAIR Right 04/13/2014   Procedure: RIGHT SHOULDER ARTHROSCOPY WITH DEBRIDEMENT EXTENSIVE;  Surgeon: Johnny Bridge, MD;  Location: [place];  Service: Orthopedics;  Laterality: Right;  . TOTAL SHOULDER ARTHROPLASTY Right 11/23/2014   Procedure: TOTAL RIGHT SHOULDER ARTHROPLASTY;  Surgeon: Johnny Bridge, MD;  Location: [place];  Service: Orthopedics;  Laterality: Right;  . TOTAL SHOULDER ARTHROPLASTY Left 07/05/2015   Procedure: LEFT TOTAL SHOULDER REPLACEMENT;  Surgeon: Marchia Bond, MD;  Location: [place];  Service: Orthopedics;  Laterality: Left;    Prior to Admission medications    Medication Sig Start Date End Date Taking? Authorizing Provider  albuterol (VENTOLIN HFA) 108 (90 Base) MCG/ACT inhaler Inhale 2 puffs into the lungs every 6 (six) hours as needed for wheezing or shortness of breath.   Yes [provider]  ALPRAZolam Duanne Moron) 1 MG tablet TAKE 1/2 TO 1 TABLET BY MOUTH 3 TIMES DAILY AS NEEDED ANXIETY 03/28/19  Yes Baity, Coralie Keens, NP  gabapentin (NEURONTIN) 300 MG capsule TAKE 1 CAPSULE TWICE DAILY AND TAKE 2 CAPSULES AT BEDTIME 04/06/19  Yes Venia Carbon, MD  lamoTRIgine (LAMICTAL) 100 MG tablet Take 1 tablet (100 mg total) by mouth 2 (two) times daily. 03/27/19  Yes Venia Carbon, MD  losartan-hydrochlorothiazide [facility]) 100-12.5 MG tablet TAKE 1 TABLET EVERY DAY 12/26/18  Yes Venia Carbon, MD  ondansetron (ZOFRAN ODT) 4 MG disintegrating tablet Take 1 tablet (4 mg total) by mouth every 8 (eight) hours as needed for nausea or vomiting. 08/08/18  Yes Alfred Levins, [place], MD  PARoxetine (PAXIL) 20 MG tablet Take 1 tablet (20 mg total) by mouth daily. 03/27/19  Yes Venia Carbon, MD  cyclobenzaprine (FLEXERIL) 5 MG tablet Take 1-2 tablets 3 times daily as needed Patient not taking: Reported on 03/15/2019 12/06/18   Laban Emperor, PA-C  nicotine (NICODERM CQ - DOSED IN MG/24 HOURS) 14 mg/24hr patch Place 1 patch (14 mg total) onto the skin daily. Patient not taking: Reported on 03/15/2019 06/15/18   Bettey Costa, MD    Allergies as of 04/05/2019 - Review Complete 03/15/2019  Allergen Reaction Noted  . Carbamazepine Hives 08/26/2015  . Divalproex sodium Swelling and Other (See Comments) 01/01/2016  . Clonazepam Other (See Comments) 08/12/2005  . Diphenhydramine hcl Rash 08/26/2015  . Tiotropium bromide monohydrate Other (See Comments)   . Vicodin [hydrocodone-acetaminophen] Itching 11/15/2014    Family History  Problem Relation Age of Onset  . Cervical cancer Mother   . Anesthesia problems Mother        post-op N/V  . Rheum arthritis Mother   .  Anxiety disorder Mother   . Cancer Father        colon cancer  . Migraines Sister   . Cervical cancer Sister   . Anesthesia problems Sister        post-op N/V  . Migraines Brother   . Asthma Daughter   . Cerebral palsy Daughter        age 46  . Coronary artery disease Maternal Grandmother   . Hypertension Maternal Grandmother   . Diabetes Maternal Grandmother   . Coronary artery disease Paternal Grandmother   . Hypertension Paternal Grandmother   . Diabetes Paternal Grandmother   . Breast cancer Maternal Aunt   . Breast cancer Maternal Aunt   . Breast cancer Maternal Aunt     Social History   Socioeconomic History  . Marital status: Married    Spouse name: Not on  file  . Number of children: 3  . Years of education: Not on file  . Highest education level: Not on file  Occupational History  . Occupation: Disabled 2003  Social Needs  . Financial resource strain: Not on file  . Food insecurity    Worry: Not on file    Inability: Not on file  . Transportation needs    Medical: Not on file    Non-medical: Not on file  Tobacco Use  . Smoking status: Current Every Day Smoker    Packs/day: 1.00    Years: 42.75    Pack years: 42.75    Types: Cigarettes  . Smokeless tobacco: Never Used  Substance and Sexual Activity  . Alcohol use: Yes    Alcohol/week: 0.0 standard drinks    Comment: rare  . Drug use: Yes    Types: Marijuana    Comment: per pt she smoke thc at bedtime and prn  ( for pain relief and to help sleep since not given percocet anymore )-per pt   . Sexual activity: Not on file  Lifestyle  . Physical activity    Days per week: Not on file    Minutes per session: Not on file  . Stress: Not on file  Relationships  . Social Herbalist on phone: Not on file    Gets together: Not on file    Attends religious service: Not on file    Active member of club or organization: Not on file    Attends meetings of clubs or organizations: Not on file     Relationship status: Not on file  . Intimate partner violence    Fear of current or ex partner: Not on file    Emotionally abused: Not on file    Physically abused: Not on file    Forced sexual activity: Not on file  Other Topics Concern  . Not on file  Social History Narrative   No living will   Husband, then oldest son Roosvelt Harps, should make decisions   Would accept resuscitation   No tube feeds     Review of Systems: See HPI, otherwise negative ROS  Physical Exam: BP 123/90   Pulse 80   Temp (!) 97.5 F (36.4 C)   Resp 18   Ht 5' (1.524 m)   Wt 46.7 kg   BMI 20.12 kg/m  General:   Alert,  pleasant and cooperative in NAD Head:  Normocephalic and atraumatic. Neck:  Supple; no masses or thyromegaly. Lungs:  Clear throughout to auscultation, normal respiratory effort.    Heart:  +S1, +S2, Regular rate and rhythm, No edema. Abdomen:  Soft, nontender and nondistended. Normal bowel sounds, without guarding, and without rebound.   Neurologic:  Alert and  oriented x4;  grossly normal neurologically.  Impression/Plan: MERYLE PUGMIRE is here for a colonoscopy to be performed for history of polyps and EGD for history of gastric ulcers  Risks, benefits, limitations, and alternatives regarding the procedures have been reviewed with the patient.  Questions have been answered.  All parties agreeable.   Virgel Manifold, MD  05/04/2019, 8:08 AM

## 2019-05-04 NOTE — Op Note (Addendum)
[facility] Gastroenterology Patient Name: Molly Peters Procedure Date: 05/04/2019 8:12 AM MRN: [id number] Account #: [id number] Date of Birth: 1961/03/10 Admit Type: Outpatient Age: 58 Room: [facility] ENDO ROOM 3 Gender: Female Note Status: Finalized Procedure:            Upper GI endoscopy Indications:          Follow-up of ulcer of the GI tract Providers:            Sweet Jarvis B. Bonna Gains MD, MD Referring MD:         Venia Carbon (Referring MD) Medicines:            Monitored Anesthesia Care Complications:        No immediate complications. Procedure:            Pre-Anesthesia Assessment:                       - The risks and benefits of the procedure and the                        sedation options and risks were discussed with the                        patient. All questions were answered and informed                        consent was obtained.                       - Patient identification and proposed procedure were                        verified prior to the procedure.                       - ASA Grade Assessment: II - A patient with mild                        systemic disease.                       After obtaining informed consent, the endoscope was                        passed under direct vision. Throughout the procedure,                        the patient's blood pressure, pulse, and oxygen                        saturations were monitored continuously. The Endoscope                        was introduced through the mouth, and advanced to the                        second part of duodenum. The upper GI endoscopy was                        accomplished with ease. The patient tolerated the  procedure well. Findings:      The examined esophagus was normal.      One non-bleeding gastric ulcer with a clean ulcer base (Forrest Class       III) was found in the gastric antrum. The lesion was 2 mm in largest       dimension.  Biopsies were taken with a cold forceps for histology.       Biopsies were obtained in the gastric body, at the incisura and in the       gastric antrum with cold forceps for histology.      Patchy mildly friable mucosa with contact bleeding was found in the       gastric fundus and in the gastric body. Biopsies were taken with a cold       forceps for histology.      The examined duodenum was normal. Impression:           - Normal esophagus.                       - Non-bleeding gastric ulcer with a clean ulcer base                        (Forrest Class III). Biopsied.                       - Friable gastric mucosa. Biopsied.                       - Normal examined duodenum.                       - Biopsies were obtained in the gastric body, at the                        incisura and in the gastric antrum. Recommendation:       - Await pathology results.                       - Discharge patient to home.                       - Resume previous diet.                       - Continue present medications.                       - Return to my office as previously scheduled.                       - Take prescribed proton pump inhibitor or H2 blocker                        (antacid) medications 30 - 60 minutes before meals.                       - The findings and recommendations were discussed with                        the patient. Procedure Code(s):    --- Professional ---  87579, Esophagogastroduodenoscopy, flexible, transoral;                        with biopsy, single or multiple Diagnosis Code(s):    --- Professional ---                       K25.9, Gastric ulcer, unspecified as acute or chronic,                        without hemorrhage or perforation                       K31.89, Other diseases of stomach and duodenum                       K28.9, Gastrojejunal ulcer, unspecified as acute or                        chronic, without hemorrhage or perforation CPT  copyright 2019 American Medical Association. All rights reserved. The codes documented in this report are preliminary and upon coder review may  be revised to meet current compliance requirements.  [name], MD Margretta Sidle B. Bonna Gains MD, MD 05/04/2019 8:34:50 AM This report has been signed electronically. Number of Addenda: 0 Note Initiated On: 05/04/2019 8:12 AM Estimated Blood Loss: Estimated blood loss: none.      [facility]

## 2019-05-04 NOTE — Anesthesia Preprocedure Evaluation (Signed)
Anesthesia Evaluation  Patient identified by MRN, date of birth, ID band Patient awake    Reviewed: Allergy & Precautions, NPO status , Patient's Chart, lab work & pertinent test results, reviewed documented beta blocker date and time   History of Anesthesia Complications (+) Family history of anesthesia reaction  Airway Mallampati: II  TM Distance: >3 FB     Dental  (+) Chipped, Edentulous Lower   Pulmonary pneumonia, resolved, COPD, Current Smoker,           Cardiovascular hypertension, Pt. on medications + Valvular Problems/Murmurs      Neuro/Psych  Headaches, Seizures -,  PSYCHIATRIC DISORDERS Anxiety Depression  Neuromuscular disease    GI/Hepatic PUD, GERD  ,  Endo/Other    Renal/GU      Musculoskeletal  (+) Arthritis , Fibromyalgia -  Abdominal   Peds  Hematology  (+) anemia ,   Anesthesia Other Findings Lung nodules. Nicoderm patch. Neck movement ok. EKG checked and ok.  Reproductive/Obstetrics                             Anesthesia Physical Anesthesia Plan  ASA: III  Anesthesia Plan: General   Post-op Pain Management:    Induction: Intravenous  PONV Risk Score and Plan:   Airway Management Planned:   Additional Equipment:   Intra-op Plan:   Post-operative Plan:   Informed Consent: I have reviewed the patients History and Physical, chart, labs and discussed the procedure including the risks, benefits and alternatives for the proposed anesthesia with the patient or authorized representative who has indicated his/her understanding and acceptance.       Plan Discussed with: CRNA  Anesthesia Plan Comments:         Anesthesia Quick Evaluation

## 2019-05-04 NOTE — Anesthesia Procedure Notes (Signed)
Performed by: Cook-Martin, Lorra Freeman Pre-anesthesia Checklist: Patient identified, Emergency Drugs available, Suction available, Patient being monitored and Timeout performed Patient Re-evaluated:Patient Re-evaluated prior to induction Oxygen Delivery Method: Nasal cannula Preoxygenation: Pre-oxygenation with 100% oxygen Induction Type: IV induction Airway Equipment and Method: Bite block Placement Confirmation: CO2 detector and positive ETCO2       

## 2019-05-04 NOTE — Anesthesia Post-op Follow-up Note (Signed)
Anesthesia QCDR form completed.        

## 2019-05-04 NOTE — Anesthesia Postprocedure Evaluation (Signed)
Anesthesia Post Note  Patient: Molly Peters  Procedure(s) Performed: COLONOSCOPY WITH PROPOFOL (N/A ) ESOPHAGOGASTRODUODENOSCOPY (EGD) WITH PROPOFOL (N/A )  Patient location during evaluation: Endoscopy Anesthesia Type: General Level of consciousness: awake and alert Pain management: pain level controlled Vital Signs Assessment: post-procedure vital signs reviewed and stable Respiratory status: spontaneous breathing, nonlabored ventilation, respiratory function stable and patient connected to nasal cannula oxygen Cardiovascular status: blood pressure returned to baseline and stable Postop Assessment: no apparent nausea or vomiting Anesthetic complications: no     Last Vitals:  Vitals:   05/04/19 0932 05/04/19 0942  BP: 121/79 117/65  Pulse:    Resp:    Temp:    SpO2: 92%     Last Pain:  Vitals:   05/04/19 0942  TempSrc:   PainSc: 0-No pain                 Jazmynn Pho S

## 2019-05-04 NOTE — Transfer of Care (Signed)
Immediate Anesthesia Transfer of Care Note  Patient: Molly Peters  Procedure(s) Performed: COLONOSCOPY WITH PROPOFOL (N/A ) ESOPHAGOGASTRODUODENOSCOPY (EGD) WITH PROPOFOL (N/A )  Patient Location: PACU  Anesthesia Type:General  Level of Consciousness: awake and sedated  Airway & Oxygen Therapy: Patient Spontanous Breathing and Patient connected to nasal cannula oxygen  Post-op Assessment: Report given to RN and Post -op Vital signs reviewed and stable  Post vital signs: Reviewed and stable  Last Vitals:  Vitals Value Taken Time  BP    Temp    Pulse    Resp    SpO2      Last Pain:  Vitals:   05/04/19 0733  PainSc: 0-No pain         Complications: No apparent anesthesia complications

## 2019-05-04 NOTE — Op Note (Signed)
[facility] Patient Name: Molly Peters Procedure Date: 05/04/2019 8:11 AM MRN: [id number] Account #: [id number] Date of Birth: [birth year] Admit Type: Outpatient Age: 58 Room: [facility] ENDO ROOM 3 Gender: Female Note Status: Finalized Procedure:            Colonoscopy Indications:          High risk colon cancer surveillance: Personal history                        of colonic polyps, Incidental - Abnormal CT of the GI                        tract Providers:            Kynnadi Dicenso B. Bonna Gains MD, MD Referring MD:         Venia Carbon (Referring MD) Medicines:            Monitored Anesthesia Care Complications:        No immediate complications. Procedure:            Pre-Anesthesia Assessment:                       - ASA Grade Assessment: II - A patient with mild                        systemic disease.                       - Prior to the procedure, a History and Physical was                        performed, and patient medications, allergies and                        sensitivities were reviewed. The patient's tolerance of                        previous anesthesia was reviewed.                       - The risks and benefits of the procedure and the                        sedation options and risks were discussed with the                        patient. All questions were answered and informed                        consent was obtained.                       - Patient identification and proposed procedure were                        verified prior to the procedure by the physician, the                        nurse, the anesthesiologist, the anesthetist and the  technician. The procedure was verified in the procedure                        room.                       After obtaining informed consent, the colonoscope was                        passed under direct vision. Throughout the procedure,                        the  patient's blood pressure, pulse, and oxygen                        saturations were monitored continuously. The                        Colonoscope was introduced through the anus and                        advanced to the the cecum, identified by appendiceal                        orifice and ileocecal valve. The colonoscopy was                        performed with ease. The patient tolerated the                        procedure well. The quality of the bowel preparation                        was fair. Findings:      The perianal and digital rectal examinations were normal.      Three sessile polyps were found in the sigmoid colon. The polyps were 4       to 5 mm in size. These polyps were removed with a jumbo cold forceps.       Resection and retrieval were complete.      Multiple diverticula were found in the sigmoid colon.      The exam was otherwise without abnormality.      The rectum, sigmoid colon, descending colon, transverse colon, ascending       colon and cecum appeared normal.      The retroflexed view of the distal rectum and anal verge was normal and       showed no anal or rectal abnormalities. Impression:           - Preparation of the colon was fair.                       - Three 4 to 5 mm polyps in the sigmoid colon, removed                        with a jumbo cold forceps. Resected and retrieved.                       - Diverticulosis in the sigmoid colon.                       -  The examination was otherwise normal.                       - The rectum, sigmoid colon, descending colon,                        transverse colon, ascending colon and cecum are normal.                       - The distal rectum and anal verge are normal on                        retroflexion view. Recommendation:       - Discharge patient to home (with escort).                       - High fiber diet.                       - Advance diet as tolerated.                       - Continue  present medications.                       - Await pathology results.                       - Repeat colonoscopy in 2 years with 2 day prep.                       - The findings and recommendations were discussed with                        the patient.                       - The findings and recommendations were discussed with                        the patient's family.                       - Return to primary care physician as previously                        scheduled. Procedure Code(s):    --- Professional ---                       [phone], Colonoscopy, flexible; with biopsy, single or                        multiple Diagnosis Code(s):    --- Professional ---                       Z86.010, Personal history of colonic polyps                       K63.5, Polyp of colon                       K57.30, Diverticulosis of large intestine without  perforation or abscess without bleeding CPT copyright 2019 American Medical Association. All rights reserved. The codes documented in this report are preliminary and upon coder review may  be revised to meet current compliance requirements.  [name], MD Margretta Sidle B. Bonna Gains MD, MD 05/04/2019 9:19:36 AM This report has been signed electronically. Number of Addenda: 0 Note Initiated On: 05/04/2019 8:11 AM Scope Withdrawal Time: 0 hours 19 minutes 33 seconds  Total Procedure Duration: 0 hours 33 minutes 2 seconds  Estimated Blood Loss: Estimated blood loss: none.      [facility]

## 2019-05-05 ENCOUNTER — Encounter: Payer: Self-pay | Admitting: Gastroenterology

## 2019-05-05 ENCOUNTER — Inpatient Hospital Stay: Payer: Medicare HMO

## 2019-05-05 ENCOUNTER — Other Ambulatory Visit: Payer: Medicare HMO

## 2019-05-06 LAB — SURGICAL PATHOLOGY

## 2019-05-09 ENCOUNTER — Other Ambulatory Visit: Payer: Self-pay

## 2019-05-09 ENCOUNTER — Inpatient Hospital Stay: Payer: Medicare HMO

## 2019-05-09 ENCOUNTER — Inpatient Hospital Stay: Payer: Medicare HMO | Attending: Hematology and Oncology

## 2019-05-09 DIAGNOSIS — R918 Other nonspecific abnormal finding of lung field: Secondary | ICD-10-CM | POA: Diagnosis not present

## 2019-05-09 DIAGNOSIS — E538 Deficiency of other specified B group vitamins: Secondary | ICD-10-CM | POA: Insufficient documentation

## 2019-05-09 DIAGNOSIS — D500 Iron deficiency anemia secondary to blood loss (chronic): Secondary | ICD-10-CM

## 2019-05-09 LAB — CBC WITH DIFFERENTIAL/PLATELET
Abs Immature Granulocytes: 0.03 10*3/uL (ref 0.00–0.07)
Basophils Absolute: 0.1 10*3/uL (ref 0.0–0.1)
Basophils Relative: 1 %
Eosinophils Absolute: 0.2 10*3/uL (ref 0.0–0.5)
Eosinophils Relative: 2 %
HCT: 41.1 % (ref 36.0–46.0)
Hemoglobin: 14.6 g/dL (ref 12.0–15.0)
Immature Granulocytes: 0 %
Lymphocytes Relative: 44 %
Lymphs Abs: 4.5 10*3/uL — ABNORMAL HIGH (ref 0.7–4.0)
MCH: 33.3 pg (ref 26.0–34.0)
MCHC: 35.5 g/dL (ref 30.0–36.0)
MCV: 93.8 fL (ref 80.0–100.0)
Monocytes Absolute: 0.7 10*3/uL (ref 0.1–1.0)
Monocytes Relative: 7 %
Neutro Abs: 4.8 10*3/uL (ref 1.7–7.7)
Neutrophils Relative %: 46 %
Platelets: 418 10*3/uL — ABNORMAL HIGH (ref 150–400)
RBC: 4.38 MIL/uL (ref 3.87–5.11)
RDW: 12.5 % (ref 11.5–15.5)
WBC: 10.3 10*3/uL (ref 4.0–10.5)
nRBC: 0 % (ref 0.0–0.2)

## 2019-05-09 LAB — FERRITIN: Ferritin: 51 ng/mL (ref 11–307)

## 2019-05-09 MED ORDER — CYANOCOBALAMIN 1000 MCG/ML IJ SOLN
1000.0000 ug | Freq: Once | INTRAMUSCULAR | Status: AC
  Administered 2019-05-09: 1000 ug via INTRAMUSCULAR

## 2019-05-15 ENCOUNTER — Other Ambulatory Visit: Payer: Self-pay | Admitting: Internal Medicine

## 2019-05-15 NOTE — Telephone Encounter (Signed)
Please let her know that I cannot refill this again if she no shows for her upcoming appt

## 2019-05-15 NOTE — Telephone Encounter (Signed)
Left message on VM per DPR with Dr Alla German message

## 2019-05-15 NOTE — Telephone Encounter (Signed)
Last filled 03-28-19 #90 Last OV 02-14-19 Next OV 06-20-19 [facility]

## 2019-05-17 ENCOUNTER — Encounter: Payer: Self-pay | Admitting: Gastroenterology

## 2019-06-01 ENCOUNTER — Other Ambulatory Visit: Payer: Self-pay

## 2019-06-02 ENCOUNTER — Inpatient Hospital Stay: Payer: Medicare HMO | Attending: Hematology and Oncology

## 2019-06-02 VITALS — BP 116/79 | HR 70 | Temp 97.0°F | Resp 18

## 2019-06-02 DIAGNOSIS — E538 Deficiency of other specified B group vitamins: Secondary | ICD-10-CM | POA: Insufficient documentation

## 2019-06-02 DIAGNOSIS — D500 Iron deficiency anemia secondary to blood loss (chronic): Secondary | ICD-10-CM

## 2019-06-02 MED ORDER — CYANOCOBALAMIN 1000 MCG/ML IJ SOLN
INTRAMUSCULAR | Status: AC
  Filled 2019-06-02: qty 1

## 2019-06-02 MED ORDER — CYANOCOBALAMIN 1000 MCG/ML IJ SOLN
1000.0000 ug | Freq: Once | INTRAMUSCULAR | Status: AC
  Administered 2019-06-02: 1000 ug via INTRAMUSCULAR

## 2019-06-05 ENCOUNTER — Other Ambulatory Visit: Payer: Self-pay | Admitting: Gastroenterology

## 2019-06-20 ENCOUNTER — Ambulatory Visit (INDEPENDENT_AMBULATORY_CARE_PROVIDER_SITE_OTHER): Payer: Medicare HMO | Admitting: Internal Medicine

## 2019-06-20 ENCOUNTER — Encounter: Payer: Self-pay | Admitting: Internal Medicine

## 2019-06-20 ENCOUNTER — Other Ambulatory Visit: Payer: Self-pay

## 2019-06-20 VITALS — BP 110/70 | HR 85 | Temp 98.4°F | Ht 59.5 in | Wt 101.0 lb

## 2019-06-20 DIAGNOSIS — I10 Essential (primary) hypertension: Secondary | ICD-10-CM | POA: Diagnosis not present

## 2019-06-20 DIAGNOSIS — J449 Chronic obstructive pulmonary disease, unspecified: Secondary | ICD-10-CM | POA: Diagnosis not present

## 2019-06-20 DIAGNOSIS — Z7189 Other specified counseling: Secondary | ICD-10-CM

## 2019-06-20 DIAGNOSIS — M79601 Pain in right arm: Secondary | ICD-10-CM | POA: Diagnosis not present

## 2019-06-20 DIAGNOSIS — Z0001 Encounter for general adult medical examination with abnormal findings: Secondary | ICD-10-CM | POA: Diagnosis not present

## 2019-06-20 DIAGNOSIS — F39 Unspecified mood [affective] disorder: Secondary | ICD-10-CM

## 2019-06-20 DIAGNOSIS — G40909 Epilepsy, unspecified, not intractable, without status epilepticus: Secondary | ICD-10-CM | POA: Diagnosis not present

## 2019-06-20 DIAGNOSIS — Z0000 Encounter for general adult medical examination without abnormal findings: Secondary | ICD-10-CM

## 2019-06-20 LAB — CBC
HCT: 42.8 % (ref 36.0–46.0)
Hemoglobin: 14.5 g/dL (ref 12.0–15.0)
MCHC: 33.8 g/dL (ref 30.0–36.0)
MCV: 96.4 fl (ref 78.0–100.0)
Platelets: 285 10*3/uL (ref 150.0–400.0)
RBC: 4.44 Mil/uL (ref 3.87–5.11)
RDW: 13.4 % (ref 11.5–15.5)
WBC: 9.9 10*3/uL (ref 4.0–10.5)

## 2019-06-20 LAB — COMPREHENSIVE METABOLIC PANEL
ALT: 10 U/L (ref 0–35)
AST: 23 U/L (ref 0–37)
Albumin: 4.3 g/dL (ref 3.5–5.2)
Alkaline Phosphatase: 68 U/L (ref 39–117)
BUN: 21 mg/dL (ref 6–23)
CO2: 29 mEq/L (ref 19–32)
Calcium: 9.7 mg/dL (ref 8.4–10.5)
Chloride: 101 mEq/L (ref 96–112)
Creatinine, Ser: 1.02 mg/dL (ref 0.40–1.20)
GFR: 55.36 mL/min — ABNORMAL LOW (ref >60.00)
Glucose, Bld: 86 mg/dL (ref 70–99)
Potassium: 3.8 mEq/L (ref 3.5–5.1)
Sodium: 138 mEq/L (ref 135–145)
Total Bilirubin: 0.3 mg/dL (ref 0.2–1.2)
Total Protein: 6.9 g/dL (ref 6.0–8.3)

## 2019-06-20 LAB — T4, FREE: Free T4: 0.76 ng/dL (ref 0.60–1.60)

## 2019-06-20 MED ORDER — ALPRAZOLAM 1 MG PO TABS: ORAL_TABLET | ORAL | 0 refills | Status: DC

## 2019-06-20 NOTE — Progress Notes (Signed)
Hearing Screening   Method: Audiometry   125Hz  250Hz  500Hz  1000Hz  2000Hz  3000Hz  4000Hz  6000Hz  8000Hz   Right ear:   20 20 20  20     Left ear:   20 20 20  20     Vision Screening Comments: September 2019

## 2019-06-20 NOTE — Assessment & Plan Note (Signed)
I suspect this is related to her neck If it doesn't improve, will send her back to [facility] neurology

## 2019-06-20 NOTE — Assessment & Plan Note (Signed)
I have personally reviewed the Medicare Annual Wellness questionnaire and have noted 1. The patient's medical and social history 2. Their use of alcohol, tobacco or illicit drugs 3. Their current medications and supplements 4. The patient's functional ability including ADL's, fall risks, home safety risks and hearing or visual             impairment. 5. Diet and physical activities 6. Evidence for depression or mood disorders  The patients weight, height, BMI and visual acuity have been recorded in the chart I have made referrals, counseling and provided education to the patient based review of the above and I have provided the pt with a written personalized care plan for preventive services.  I have provided you with a copy of your personalized plan for preventive services. Please take the time to review along with your updated medication list.  Recent colon--repeat due in 2 years UTD on mammograms--sees surgeon now after papilloma Flu vaccine in 1-2 months Discussed cigarette cessation

## 2019-06-20 NOTE — Assessment & Plan Note (Signed)
BP Readings from Last 3 Encounters:  06/20/19 110/70  06/02/19 116/79  05/04/19 117/65   Fine now Will check labs

## 2019-06-20 NOTE — Assessment & Plan Note (Signed)
None on lamictal

## 2019-06-20 NOTE — Assessment & Plan Note (Signed)
No recent depression  Chronic anxiety--on xanax

## 2019-06-20 NOTE — Progress Notes (Signed)
Subjective:    Patient ID: Molly Peters, female    DOB: January 04, 1961, 58 y.o.   MRN: [id number]  HPI Here for Medicare wellness visit and follow up of chronic health conditions Reviewed form and advanced directives Reviewed other doctors No alcohol Still smoking---counseled Vision fair Hearing is okay Trips a lot--but doesn't remember any falls Not depressed now Does her housework--but has trouble going up stairs Memory seems okay--- mild problems Does walk occasionally  Having right arm/shoulder pain---will wake her from sleep Goes from shoulder to hand--with numbness Swelling in hand at times Started 2-3 weeks ago No neck or shoulder injury Neck stays "knotted up" Fairly normal ROM of shoulder--but feels it "in the muscle" Does have some weakness---will drop things with that hand  No longer seeing neurologist No seizures on the lamictal Has been weaning down on her gabapentin  Has some itchy bumps in perineal area Outside and at vaginal area Present for years without significant change Uses vaginal cream for a couple of days---helps the itch  Regular cough--with sputum Breathing is not so good--doesn't like the mask No change otherwise Does try to stop--never able  Mood is okay No regular depression  Not anhedonic Chronic anxiety--- uses the xanax (mostly bid)  Episodic chest pain---relates to anxiety or breathing Mostly in epigastrium and she thinks it is gas (2 minutes maybe) No palpitaitons Occasional headaches No edema  Current Outpatient Medications on File Prior to Visit  Medication Sig Dispense Refill  . albuterol (VENTOLIN HFA) 108 (90 Base) MCG/ACT inhaler Inhale 2 puffs into the lungs every 6 (six) hours as needed for wheezing or shortness of breath.    . ALPRAZolam (XANAX) 1 MG tablet TAKE 1/2 TO 1 TABLET BY MOUTH 3 TIMES DAILY AS NEEDED ANXEITY 90 tablet 0  . gabapentin (NEURONTIN) 300 MG capsule TAKE 1 CAPSULE TWICE DAILY AND TAKE 2  CAPSULES AT BEDTIME 360 capsule 0  . lamoTRIgine (LAMICTAL) 100 MG tablet Take 1 tablet (100 mg total) by mouth 2 (two) times daily. 180 tablet 1  . losartan-hydrochlorothiazide (HYZAAR) 100-12.5 MG tablet TAKE 1 TABLET EVERY DAY 90 tablet 1  . ondansetron (ZOFRAN ODT) 4 MG disintegrating tablet Take 1 tablet (4 mg total) by mouth every 8 (eight) hours as needed for nausea or vomiting. 20 tablet 0  . pantoprazole (PROTONIX) 20 MG tablet TAKE 1 TABLET BY MOUTH ONCE DAILY 30 tablet 1  . PARoxetine (PAXIL) 20 MG tablet Take 1 tablet (20 mg total) by mouth daily. 90 tablet 1   No current facility-administered medications on file prior to visit.     Allergies  Allergen Reactions  . Carbamazepine Hives  . Divalproex Sodium Swelling and Other (See Comments)    HAIR FALLS OUT  . Clonazepam Other (See Comments)    HALLUCINATIONS  . Diphenhydramine Hcl Rash  . Tiotropium Bromide Monohydrate Other (See Comments)    CREATES YEAST IN THROAT  . Vicodin [Hydrocodone-Acetaminophen] Itching    Past Medical History:  Diagnosis Date  . Anxiety   . Cervical cancer ([place]) 1980's  . Chronic back pain 11/12/2009   Qualifier: Diagnosis of  By: Maxie Better FNP, Rosalita Levan   . COPD (chronic obstructive pulmonary disease) (HCC)    states SOB with ADLs; no O2 use; able to speak in complete sentences without SOB(11/15/2014)  . Depression   . Difficulty swallowing pills    s/p cervical fusion  . Family history of adverse reaction to anesthesia    pt's mother and sister have  hx. of post-op N/V  . Fibromyalgia   . GERD (gastroesophageal reflux disease)   . Heart murmur   . History of gastric ulcer   . Hypertension    states under control with med., has been on med. x 1-2 yr.  . IBS (irritable bowel syndrome)   . Internal hemorrhoid    states has had intermittent bright red bleeding with BM (11/15/2014)  . Intraductal papilloma of breast, right 08/08/2018  . Limited joint range of motion    neck - s/p  cervical fusion  . Localized primary osteoarthritis of right shoulder region 11/23/2014  . Osteoarthritis of left shoulder 07/05/2015  . Osteoarthritis of right shoulder 11/2014  . Pneumonia   . Seizures ([place])    last seizure 2010  . Short-term memory loss   . TMJ syndrome   . Valvular heart disease    Initial workup a number of years ago at [facility].  Echo (10/2009) showed EF 60-65%,      normal LV size, moderate aortic insufficiency with a trileaflet aortic valve and normal aortic root size.  Mild      mitral regurgitation.   . Wears dentures    lower    Past Surgical History:  Procedure Laterality Date  . ABDOMINAL HYSTERECTOMY     partial  . ANTERIOR CERVICAL DECOMP/DISCECTOMY FUSION  02/06/2011   C4-5, C5-6, C6-7  . BREAST BIOPSY Bilateral 10+ yrs ago   neg  . BREAST BIOPSY Right 08/03/2018   2 areas bx, path pending  . BREAST LUMPECTOMY Bilateral 1989   x 3 - benign  . BREAST LUMPECTOMY Right 08/24/2018   Procedure: BREAST LUMPECTOMY WITH ULTRASOUND IN OR;  Surgeon: Vickie Epley, MD;  Location: [facility] ORS;  Service: General;  Laterality: Right;  . CARDIAC CATHETERIZATION  1995  . CESAREAN SECTION     x 2  . COLONOSCOPY  09/28/2008  . COLONOSCOPY N/A 06/15/2018   Procedure: COLONOSCOPY;  Surgeon: Virgel Manifold, MD;  Location: [facility] ENDOSCOPY;  Service: Endoscopy;  Laterality: N/A;  . COLONOSCOPY WITH PROPOFOL N/A 05/04/2019   Procedure: COLONOSCOPY WITH PROPOFOL;  Surgeon: Virgel Manifold, MD;  Location: [facility] ENDOSCOPY;  Service: Endoscopy;  Laterality: N/A;  . ESOPHAGOGASTRODUODENOSCOPY  09/28/2008  . ESOPHAGOGASTRODUODENOSCOPY N/A 06/14/2018   Procedure: ESOPHAGOGASTRODUODENOSCOPY (EGD);  Surgeon: Virgel Manifold, MD;  Location: [facility];  Service: Endoscopy;  Laterality: N/A;  . ESOPHAGOGASTRODUODENOSCOPY (EGD) WITH PROPOFOL N/A 05/04/2019   Procedure: ESOPHAGOGASTRODUODENOSCOPY (EGD) WITH PROPOFOL;  Surgeon: Virgel Manifold, MD;  Location: [facility]  ENDOSCOPY;  Service: Endoscopy;  Laterality: N/A;  . HARDWARE REMOVAL  11/17/2006   L5-S1  . LAMINECTOMY WITH POSTERIOR LATERAL ARTHRODESIS LEVEL 3  12/06/2009   with synovial cyst resection L3-4 bilat.  Marland Kitchen LAMINECTOMY WITH POSTERIOR LATERAL ARTHRODESIS LEVEL 3  11/17/2006   L4-5  . NM MYOVIEW LTD     Lexiscan myoview (10/2009): EF 77%, normal wall motion, normal perfusion.   Marland Kitchen SHOULDER ARTHROSCOPY WITH DEBRIDEMENT AND BICEP TENDON REPAIR Right 04/13/2014   Procedure: RIGHT SHOULDER ARTHROSCOPY WITH DEBRIDEMENT EXTENSIVE;  Surgeon: Johnny Bridge, MD;  Location: [facility];  Service: Orthopedics;  Laterality: Right;  . TOTAL SHOULDER ARTHROPLASTY Right 11/23/2014   Procedure: TOTAL RIGHT SHOULDER ARTHROPLASTY;  Surgeon: Johnny Bridge, MD;  Location: [facility];  Service: Orthopedics;  Laterality: Right;  . TOTAL SHOULDER ARTHROPLASTY Left 07/05/2015   Procedure: LEFT TOTAL SHOULDER REPLACEMENT;  Surgeon: Marchia Bond, MD;  Location: [facility];  Service: Orthopedics;  Laterality: Left;    Family History  Problem Relation Age of Onset  . Cervical cancer Mother   . Anesthesia problems Mother        post-op N/V  . Rheum arthritis Mother   . Anxiety disorder Mother   . Cancer Father        colon cancer  . Migraines Sister   . Cervical cancer Sister   . Anesthesia problems Sister        post-op N/V  . Migraines Brother   . Asthma Daughter   . Cerebral palsy Daughter        age 16  . Coronary artery disease Maternal Grandmother   . Hypertension Maternal Grandmother   . Diabetes Maternal Grandmother   . Coronary artery disease Paternal Grandmother   . Hypertension Paternal Grandmother   . Diabetes Paternal Grandmother   . Breast cancer Maternal Aunt   . Breast cancer Maternal Aunt   . Breast cancer Maternal Aunt     Social History   Socioeconomic History  . Marital status: Married    Spouse name: Not on file  . Number of children: 3   . Years of education: Not on file  . Highest education level: Not on file  Occupational History  . Occupation: Disabled 2003  Social Needs  . Financial resource strain: Not on file  . Food insecurity    Worry: Not on file    Inability: Not on file  . Transportation needs    Medical: Not on file    Non-medical: Not on file  Tobacco Use  . Smoking status: Current Every Day Smoker    Packs/day: 1.00    Years: 42.75    Pack years: 42.75    Types: Cigarettes  . Smokeless tobacco: Never Used  Substance and Sexual Activity  . Alcohol use: Yes    Alcohol/week: 0.0 standard drinks    Comment: rare  . Drug use: Yes    Types: Marijuana    Comment: per pt she smoke thc at bedtime and prn  ( for pain relief and to help sleep since not given percocet anymore )-per pt   . Sexual activity: Not on file  Lifestyle  . Physical activity    Days per week: Not on file    Minutes per session: Not on file  . Stress: Not on file  Relationships  . Social Herbalist on phone: Not on file    Gets together: Not on file    Attends religious service: Not on file    Active member of club or organization: Not on file    Attends meetings of clubs or organizations: Not on file    Relationship status: Not on file  . Intimate partner violence    Fear of current or ex partner: Not on file    Emotionally abused: Not on file    Physically abused: Not on file    Forced sexual activity: Not on file  Other Topics Concern  . Not on file  Social History Narrative   No living will   Husband, then oldest son Roosvelt Harps, should make decisions   Would accept resuscitation   No tube feeds    Review of Systems Appetite is good Weight stable---at her normal weight Sleeps okay in general Wears seat belt Teeth okay---overdue for dentist Several scaly areas but nothing that is suspicious (on arms) Bowels are fine. No blood now Occasional stomach pain Occasional heartburn--depending on  what she  eats. No dysphagia    Objective:   Physical Exam  Constitutional: She is oriented to person, place, and time. She appears well-developed. No distress.  HENT:  Mouth/Throat: Oropharynx is clear and moist. No oropharyngeal exudate.  No oral lesions  Neck: No thyromegaly present.  Cardiovascular: Normal rate, regular rhythm, normal heart sounds and intact distal pulses. Exam reveals no gallop.  No murmur heard. Respiratory: Effort normal. No respiratory distress. She has no wheezes.  Decreased breath sounds but clear  GI: Soft. There is no abdominal tenderness.  Genitourinary:    Genitourinary Comments: No obvious lesions Prominence at hair follicles only   Musculoskeletal:        General: No edema.     Comments: Fairly normal ROM right shoulder  Lymphadenopathy:    She has no cervical adenopathy.  Neurological: She is alert and oriented to person, place, and time.  President-- "[employer]" [phone] [id number] Recall 3/3  Slight right arm and grip strength weakness  Skin: No rash noted. No erythema.  Psychiatric: She has a normal mood and affect. Her behavior is normal.           Assessment & Plan:

## 2019-06-20 NOTE — Assessment & Plan Note (Signed)
Stable symptoms Keeps trying unsuccessfully to stop smoking

## 2019-06-20 NOTE — Assessment & Plan Note (Signed)
See social history 

## 2019-06-21 ENCOUNTER — Ambulatory Visit: Payer: Medicare HMO | Admitting: Gastroenterology

## 2019-06-29 ENCOUNTER — Other Ambulatory Visit: Payer: Self-pay

## 2019-06-30 ENCOUNTER — Inpatient Hospital Stay: Payer: Medicare HMO

## 2019-07-13 ENCOUNTER — Encounter: Payer: Self-pay | Admitting: Gastroenterology

## 2019-07-13 ENCOUNTER — Ambulatory Visit: Payer: Medicare HMO | Admitting: Gastroenterology

## 2019-07-14 ENCOUNTER — Other Ambulatory Visit: Payer: Self-pay | Admitting: Internal Medicine

## 2019-07-27 ENCOUNTER — Encounter: Payer: Self-pay | Admitting: Oncology

## 2019-07-27 ENCOUNTER — Other Ambulatory Visit: Payer: Self-pay

## 2019-07-27 ENCOUNTER — Other Ambulatory Visit: Payer: Self-pay | Admitting: Internal Medicine

## 2019-07-27 NOTE — Progress Notes (Signed)
Patient pre screened for office appointment, no questions or concerns today. 

## 2019-07-28 ENCOUNTER — Inpatient Hospital Stay: Payer: Medicare HMO

## 2019-07-28 ENCOUNTER — Inpatient Hospital Stay: Payer: Medicare HMO | Attending: Hematology and Oncology | Admitting: Oncology

## 2019-07-28 ENCOUNTER — Encounter: Payer: Self-pay | Admitting: Oncology

## 2019-07-28 VITALS — BP 168/89 | HR 65 | Temp 97.9°F | Resp 18 | Ht 59.5 in | Wt 100.9 lb

## 2019-07-28 DIAGNOSIS — D241 Benign neoplasm of right breast: Secondary | ICD-10-CM | POA: Diagnosis not present

## 2019-07-28 DIAGNOSIS — Z8711 Personal history of peptic ulcer disease: Secondary | ICD-10-CM | POA: Insufficient documentation

## 2019-07-28 DIAGNOSIS — Z800 Family history of malignant neoplasm of digestive organs: Secondary | ICD-10-CM | POA: Insufficient documentation

## 2019-07-28 DIAGNOSIS — M79602 Pain in left arm: Secondary | ICD-10-CM | POA: Diagnosis not present

## 2019-07-28 DIAGNOSIS — J449 Chronic obstructive pulmonary disease, unspecified: Secondary | ICD-10-CM | POA: Diagnosis not present

## 2019-07-28 DIAGNOSIS — I10 Essential (primary) hypertension: Secondary | ICD-10-CM | POA: Insufficient documentation

## 2019-07-28 DIAGNOSIS — R918 Other nonspecific abnormal finding of lung field: Secondary | ICD-10-CM | POA: Diagnosis not present

## 2019-07-28 DIAGNOSIS — Z8249 Family history of ischemic heart disease and other diseases of the circulatory system: Secondary | ICD-10-CM | POA: Insufficient documentation

## 2019-07-28 DIAGNOSIS — N6310 Unspecified lump in the right breast, unspecified quadrant: Secondary | ICD-10-CM | POA: Insufficient documentation

## 2019-07-28 DIAGNOSIS — R079 Chest pain, unspecified: Secondary | ICD-10-CM | POA: Diagnosis not present

## 2019-07-28 DIAGNOSIS — M79601 Pain in right arm: Secondary | ICD-10-CM | POA: Diagnosis not present

## 2019-07-28 DIAGNOSIS — K625 Hemorrhage of anus and rectum: Secondary | ICD-10-CM | POA: Insufficient documentation

## 2019-07-28 DIAGNOSIS — D500 Iron deficiency anemia secondary to blood loss (chronic): Secondary | ICD-10-CM

## 2019-07-28 DIAGNOSIS — R202 Paresthesia of skin: Secondary | ICD-10-CM | POA: Diagnosis not present

## 2019-07-28 DIAGNOSIS — F17210 Nicotine dependence, cigarettes, uncomplicated: Secondary | ICD-10-CM | POA: Diagnosis not present

## 2019-07-28 DIAGNOSIS — D125 Benign neoplasm of sigmoid colon: Secondary | ICD-10-CM | POA: Insufficient documentation

## 2019-07-28 DIAGNOSIS — Z122 Encounter for screening for malignant neoplasm of respiratory organs: Secondary | ICD-10-CM | POA: Diagnosis not present

## 2019-07-28 DIAGNOSIS — D509 Iron deficiency anemia, unspecified: Secondary | ICD-10-CM | POA: Insufficient documentation

## 2019-07-28 DIAGNOSIS — M2550 Pain in unspecified joint: Secondary | ICD-10-CM | POA: Diagnosis not present

## 2019-07-28 DIAGNOSIS — R5383 Other fatigue: Secondary | ICD-10-CM | POA: Insufficient documentation

## 2019-07-28 DIAGNOSIS — R51 Headache: Secondary | ICD-10-CM | POA: Insufficient documentation

## 2019-07-28 DIAGNOSIS — Z885 Allergy status to narcotic agent status: Secondary | ICD-10-CM | POA: Diagnosis not present

## 2019-07-28 DIAGNOSIS — E538 Deficiency of other specified B group vitamins: Secondary | ICD-10-CM

## 2019-07-28 DIAGNOSIS — Z820 Family history of epilepsy and other diseases of the nervous system: Secondary | ICD-10-CM | POA: Insufficient documentation

## 2019-07-28 DIAGNOSIS — Z8719 Personal history of other diseases of the digestive system: Secondary | ICD-10-CM | POA: Diagnosis not present

## 2019-07-28 DIAGNOSIS — R0602 Shortness of breath: Secondary | ICD-10-CM | POA: Insufficient documentation

## 2019-07-28 DIAGNOSIS — Z8541 Personal history of malignant neoplasm of cervix uteri: Secondary | ICD-10-CM | POA: Diagnosis not present

## 2019-07-28 DIAGNOSIS — Z79899 Other long term (current) drug therapy: Secondary | ICD-10-CM | POA: Insufficient documentation

## 2019-07-28 DIAGNOSIS — Z8261 Family history of arthritis: Secondary | ICD-10-CM | POA: Diagnosis not present

## 2019-07-28 DIAGNOSIS — Z803 Family history of malignant neoplasm of breast: Secondary | ICD-10-CM | POA: Insufficient documentation

## 2019-07-28 DIAGNOSIS — Z833 Family history of diabetes mellitus: Secondary | ICD-10-CM | POA: Insufficient documentation

## 2019-07-28 LAB — CBC WITH DIFFERENTIAL/PLATELET
Abs Immature Granulocytes: 0.01 10*3/uL (ref 0.00–0.07)
Basophils Absolute: 0.1 10*3/uL (ref 0.0–0.1)
Basophils Relative: 1 %
Eosinophils Absolute: 0.3 10*3/uL (ref 0.0–0.5)
Eosinophils Relative: 3 %
HCT: 41.4 % (ref 36.0–46.0)
Hemoglobin: 14.5 g/dL (ref 12.0–15.0)
Immature Granulocytes: 0 %
Lymphocytes Relative: 43 %
Lymphs Abs: 3.4 10*3/uL (ref 0.7–4.0)
MCH: 32.4 pg (ref 26.0–34.0)
MCHC: 35 g/dL (ref 30.0–36.0)
MCV: 92.6 fL (ref 80.0–100.0)
Monocytes Absolute: 0.5 10*3/uL (ref 0.1–1.0)
Monocytes Relative: 6 %
Neutro Abs: 3.7 10*3/uL (ref 1.7–7.7)
Neutrophils Relative %: 47 %
Platelets: 286 10*3/uL (ref 150–400)
RBC: 4.47 MIL/uL (ref 3.87–5.11)
RDW: 12.8 % (ref 11.5–15.5)
WBC: 7.9 10*3/uL (ref 4.0–10.5)
nRBC: 0 % (ref 0.0–0.2)

## 2019-07-28 LAB — FERRITIN: Ferritin: 32 ng/mL (ref 11–307)

## 2019-07-28 MED ORDER — CYANOCOBALAMIN 1000 MCG/ML IJ SOLN
1000.0000 ug | Freq: Once | INTRAMUSCULAR | Status: AC
  Administered 2019-07-28: 1000 ug via INTRAMUSCULAR

## 2019-07-28 NOTE — Progress Notes (Signed)
[facility]  [address], [address] [place], [place] [address] Phone: [phone]  Fax: [phone]   Telephone Office Visit:  07/28/2019   Chief Complaint: Molly Peters is a 58 y.o. female with iron deficiency anemia, B12 deficiency, and intra-ductal papilloma who is here for follow-up.  HPI:  The patient was last seen in the hematology clinic on 02/09/19. At that time, she was doing well. She noted "normal shortness of breath".  She denies any bleeding.  Energy level continue to improve with B12 injections.  Exam was stable.  She received B12 on 12/19/2018, 02/10/2019, 05/09/2019 and 06/02/2019.  She last received IV Venofer on 01/10/2019 and 01/17/2019.  She missed her last B12 appointment because her mother was hospitalized.  States "I can tell my B12 levels are low because I am so tired".   During the interim, she admits to continued chronic neck and bilateral arm numbness that has been present for years. Has fatigue. Appetite good. No weight loss. She is still smoking.  She was evaluated by her PCP Dr. Silvio Pate on 06/20/2019 for annual follow-up. She was noted to be doing relatively well. On gabapentin but has recently stopped taking d/t side effects.   She was evaluated by Dr. Laureen Abrahams on 05/04/2019 for colonoscopy and EGD d/t history of gastric ulcers and polyps. 3 polyps removed. Otherwise colonoscopy and EGD were unremarkable.  Evaluated by Dr. Dalbert Mayotte on 02/14/19 for headache virtually. Instructed to go to urgent care for imaging. CT head was negative for acute abnormality.  [place]ache resolved on own.  Had LDCT scan completed 03/30/19; Lung RADS 2. Annual follow-up recommended.   Past Medical History:  Diagnosis Date   Anxiety    Cervical cancer ([place]) 1980's   Chronic back pain 11/12/2009   Qualifier: Diagnosis of  By: Maxie Better FNP, Rosalita Levan    COPD (chronic obstructive pulmonary disease) ([place])    states SOB with ADLs; no O2 use; able to speak  in complete sentences without SOB(11/15/2014)   Depression    Difficulty swallowing pills    s/p cervical fusion   Family history of adverse reaction to anesthesia    pt's mother and sister have hx. of post-op N/V   Fibromyalgia    GERD (gastroesophageal reflux disease)    Heart murmur    History of gastric ulcer    Hypertension    states under control with med., has been on med. x 1-2 yr.   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  Echo (10/2009) showed EF 60-65%,      normal LV size, moderate aortic insufficiency with a trileaflet aortic valve and normal aortic root size.  Mild      mitral regurgitation.    Wears dentures    lower    Past Surgical History:  Procedure Laterality Date   ABDOMINAL HYSTERECTOMY     partial  ANTERIOR CERVICAL DECOMP/DISCECTOMY FUSION  02/06/2011   C4-5, C5-6, C6-7   BREAST BIOPSY Bilateral 10+ yrs ago   neg   BREAST BIOPSY Right 08/03/2018   2 areas bx, path pending   BREAST LUMPECTOMY Bilateral 1989   x 3 - benign   BREAST LUMPECTOMY Right 08/24/2018   Procedure: BREAST LUMPECTOMY WITH ULTRASOUND IN OR;  Surgeon: Vickie Epley, MD;  Location: [facility];  Service: General;  Laterality: Right;   [place]     x 2   COLONOSCOPY  09/28/2008   COLONOSCOPY N/A 06/15/2018   Procedure: COLONOSCOPY;  Surgeon: Virgel Manifold, MD;  Location: [facility] END[facility];   Service: Endoscopy;  Laterality: N/A;   COLONOSCOPY WITH PROPOFOL N/A 05/04/2019   Procedure: COLONOSCOPY WITH PROPOFOL;  Surgeon: Virgel Manifold, MD;  Location: [facility] ENDOSCOPY;  Service: Endoscopy;  Laterality: N/A;   ESOPHAGOGASTRODUODENOSCOPY  09/28/2008   ESOPHAGOGASTRODUODENOSCOPY N/A 06/14/2018   Procedure: ESOPHAGOGASTRODUODENOSCOPY (EGD);  Surgeon: Virgel Manifold, MD;  Location: [facility] ENDOSCOPY;  Service: Endoscopy;  Laterality: N/A;   ESOPHAGOGASTRODUODENOSCOPY (EGD) WITH PROPOFOL N/A 05/04/2019   Procedure: ESOPHAGOGASTRODUODENOSCOPY (EGD) WITH PROPOFOL;  Surgeon: Virgel Manifold, MD;  Location: [facility] ENDOSCOPY;  Service: Endoscopy;  Laterality: N/A;   HARDWARE REMOVAL  11/17/2006   L5-S1   LAMINECTOMY WITH POSTERIOR LATERAL ARTHRODESIS LEVEL 3  12/06/2009   with synovial cyst resection L3-4 bilat.   LAMINECTOMY WITH POSTERIOR LATERAL ARTHRODESIS LEVEL 3  11/17/2006   L4-5   NM MYOVIEW LTD     Lexiscan myoview (10/2009): EF 77%, normal wall motion, normal perfusion.    SHOULDER ARTHROSCOPY WITH DEBRIDEMENT AND BICEP TENDON REPAIR Right 04/13/2014   Procedure: RIGHT SHOULDER ARTHROSCOPY WITH DEBRIDEMENT EXTENSIVE;  Surgeon: Johnny Bridge, MD;  Location: [place];  Service: Orthopedics;  Laterality: Right;   TOTAL SHOULDER ARTHROPLASTY Right 11/23/2014   Procedure: TOTAL RIGHT SHOULDER ARTHROPLASTY;  Surgeon: Johnny Bridge, MD;  Location: [place];  Service: Orthopedics;  Laterality: Right;   TOTAL SHOULDER ARTHROPLASTY Left 07/05/2015   Procedure: LEFT TOTAL SHOULDER REPLACEMENT;  Surgeon: Marchia Bond, MD;  Location: [place];  Service: Orthopedics;  Laterality: Left;    Family History  Problem Relation Age of Onset   Cervical cancer Mother    Anesthesia problems Mother        post-op N/V   Rheum arthritis Mother    Anxiety disorder Mother    Cancer Father        colon cancer   Migraines Sister     Cervical cancer Sister    Anesthesia problems Sister        post-op N/V   Migraines Brother    Asthma Daughter    Cerebral palsy Daughter        age 14   Coronary artery disease Maternal Grandmother    Hypertension Maternal Grandmother    Diabetes Maternal Grandmother    Coronary artery disease Paternal Grandmother    Hypertension Paternal Grandmother    Diabetes Paternal Grandmother    Breast cancer Maternal Aunt    Breast cancer Maternal Aunt    Breast cancer Maternal Aunt     Social History:  reports that she has been smoking cigarettes. She has a 42.75 pack-year smoking history. She has never used smokeless tobacco. She reports current alcohol use. She reports current drug use. Drug: Marijuana.  Patient smoked 1.5 packs of cigarettes per day for 40+ years. She drinks "a  hair" every now and then. Patient has been on disability in 2005 for "ruptured back". She is a former [employer].   Participants in the patient's visit included the patient and Vito Berger, CMA, today.  The intake visit was provided by Vito Berger, CMA.   Allergies:  Allergies  Allergen Reactions   Carbamazepine Hives   Divalproex Sodium Swelling and Other (See Comments)    HAIR FALLS OUT   Clonazepam Other (See Comments)    HALLUCINATIONS   Diphenhydramine Hcl Rash   Tiotropium Bromide Monohydrate Other (See Comments)    CREATES YEAST IN THROAT   Vicodin [Hydrocodone-Acetaminophen] Itching    Current Medications: Current Outpatient Medications  Medication Sig Dispense Refill   ALPRAZolam (XANAX) 1 MG tablet TAKE 1/2 TO 1 TABLET BY MOUTH 3 TIMES DAILY AS NEEDED ANXEITY 90 tablet 0   lamoTRIgine (LAMICTAL) 100 MG tablet TAKE 1 TABLET BY MOUTH TWICE DAILY 180 tablet 3   losartan-hydrochlorothiazide (HYZAAR) 100-12.5 MG tablet TAKE 1 TABLET EVERY DAY 90 tablet 1   ondansetron (ZOFRAN ODT) 4 MG disintegrating tablet Take 1 tablet (4 mg total) by mouth every 8 (eight) hours as  needed for nausea or vomiting. 20 tablet 0   pantoprazole (PROTONIX) 20 MG tablet TAKE 1 TABLET BY MOUTH ONCE DAILY 30 tablet 1   PARoxetine (PAXIL) 20 MG tablet TAKE 1 TABLET BY MOUTH ONCE DAILY 90 tablet 3   albuterol (VENTOLIN HFA) 108 (90 Base) MCG/ACT inhaler Inhale 2 puffs into the lungs every 6 (six) hours as needed for wheezing or shortness of breath.     No current facility-administered medications for this visit.     Review of Systems  Constitutional: Negative.  Negative for chills, diaphoresis, fever, malaise/fatigue and weight loss.  HENT: Negative.  Negative for congestion, ear discharge, ear pain, nosebleeds, sinus pain and sore throat.   Eyes: Negative.  Negative for blurred vision, double vision, photophobia, pain, discharge and redness.  Respiratory: Positive for shortness of breath ("normal"). Negative for cough, hemoptysis, sputum production and wheezing.   Cardiovascular: Negative.  Negative for chest pain, palpitations, orthopnea, leg swelling and PND.  Gastrointestinal: Negative for abdominal pain, blood in stool, constipation, diarrhea, melena, nausea and vomiting.  Genitourinary: Negative.  Negative for dysuria, frequency and hematuria.  Musculoskeletal: Positive for joint pain. Negative for back pain, falls, myalgias and neck pain.  Skin: Negative.  Negative for itching and rash.  Neurological: Negative.  Negative for dizziness, tingling, tremors, sensory change, speech change, focal weakness, weakness and headaches.  Endo/Heme/Allergies: Negative.  Does not bruise/bleed easily.  Psychiatric/Behavioral: Negative.  Negative for depression and memory loss. The patient is not nervous/anxious and does not have insomnia.   All other systems reviewed and are negative.  Performance status (ECOG): 1   No visits with results within 3 Day(s) from this visit.  Latest known visit with results is:  Office Visit on 06/20/2019  Component Date Value Ref Range Status    Sodium 06/20/2019 138  135 - 145 mEq/L Final   Potassium 06/20/2019 3.8  3.5 - 5.1 mEq/L Final   Chloride 06/20/2019 101  96 - 112 mEq/L Final   CO2 06/20/2019 29  19 - 32 mEq/L Final   Glucose, Bld 06/20/2019 86  70 - 99 mg/dL Final   BUN 06/20/2019 21  6 - 23 mg/dL Final   Creatinine, Ser 06/20/2019 1.02  0.40 - 1.20 mg/dL Final   Total Bilirubin 06/20/2019 0.3  0.2 - 1.2 mg/dL Final   Alkaline Phosphatase 06/20/2019  68  39 - 117 U/L Final   AST 06/20/2019 23  0 - 37 U/L Final   ALT 06/20/2019 10  0 - 35 U/L Final   Total Protein 06/20/2019 6.9  6.0 - 8.3 g/dL Final   Albumin 06/20/2019 4.3  3.5 - 5.2 g/dL Final   Calcium 06/20/2019 9.7  8.4 - 10.5 mg/dL Final   GFR 06/20/2019 55.36* >60.00 mL/min Final   WBC 06/20/2019 9.9  4.0 - 10.5 K/uL Final   RBC 06/20/2019 4.44  3.87 - 5.11 Mil/uL Final   Platelets 06/20/2019 285.0  150.0 - 400.0 K/uL Final   Hemoglobin 06/20/2019 14.5  12.0 - 15.0 g/dL Final   HCT 06/20/2019 42.8  36.0 - 46.0 % Final   MCV 06/20/2019 96.4  78.0 - 100.0 fl Final   MCHC 06/20/2019 33.8  30.0 - 36.0 g/dL Final   RDW 06/20/2019 13.4  11.5 - 15.5 % Final   Free T4 06/20/2019 0.76  0.60 - 1.60 ng/dL Final   Comment: Specimens from patients who are undergoing biotin therapy and /or ingesting biotin supplements may contain high levels of biotin.  The higher biotin concentration in these specimens interferes with this Free T4 assay.  Specimens that contain high levels  of biotin may cause false high results for this Free T4 assay.  Please interpret results in light of the total clinical presentation of the patient.      Assessment:  TOSHIE QADRI is a 58 y.o. female with iron deficiency anemia secondary to an upper GI bleed.  She presented with intermittent melena and chest pain.  Hemoglobin nadir was 6.9 on 06/14/2018.  Ferritin was 9, iron saturation 11% and TIBC 393 on 06/20/2018.  She received 1 unit of PRBCs.  Work-up on 07/05/2018  revealed a hematocrit of 31, hemoglobin 10.2, and MCV 70.8.  Ferritin was 7.  B12 was 165 (low).  Folate was 6.3 (> 5.9).  TSH was 1.256.  Ferritin has been followed: 9 on 06/20/2018, 7 on 07/05/2018, 5 on 07/19/2018, 81 on 09/12/2018, 12 on 10/24/2018, and 71 on 02/07/2019.   She received Venofer 200 mg on 07/25/2018, weekly x 3 (08/15/2018 - 08/29/2018), ans weekly x 2 (01/10/2019 - 01/17/2019).  She has B12 deficiency.  She began oral B12 on 07/06/2018.  B12 was 165 on 07/05/2018, 396 on 07/19/2018, 421 (after B12 injection) on 09/12/2018, and 246 on 10/24/2018 (on oral B12).  Folate was 6.3 on 07/05/2018 and 9.2 on 09/12/2018. She began monthly B12 on 08/29/2018 (last 12/19/2018).  EGD on 06/14/2018 revealed esophageal mucosal changes c/w short segment of Barrett's and a non-bleeding gastric ulcers.  Pathology at the GE junction revealed squamocolumnar mucosa with moderate chronic inflammation.  Gastric biopsy revealed no H pylori, dysplasia, or malignancy.    Colonoscopy on 06/15/2018 revealed a 5 mm polyp in the sigmoid colon (tubular adenoma).  Repeat colonoscopy was discussed in 6-12 months due to fair prep.   Chest CT angiogram on 06/22/2018 reveled no pulmonary embolism.  There was underlying emphysematous changes.  There were small LUL pulmonary nodular changes (largest 3 mm).  There was no thoracic adenopathy.    CT angiogram of the abdomen and pelvis revealed no evidence of abdominal aortic aneurysm or dissection. No findings to suggest active GI bleeding on CT. There was non-vascular wall thickening involving the transverse colon extending to the splenic flexure, suspicious for infectious/inflammatory colitis.  There was associated trace pelvic ascites. There was no pneumatosis or free air.  C diff and  GI panel by PCR was negative.  She was treated with Flagyl and Augmentin.  BILATERAL mammogram on 07/27/2018 revealed 2 indeterminate masses in the RIGHT breast at the 2:30 and 3 o'clock  positions. Follow up breast ultrasounds shows a 6 x 4 x 5 mm oval hypoechoic mass, 1 cm from the nipple, in the 2:30 position. In the 3 o'clock position, again 1 cm from the nipple, the was a 6 x 3 x 3 mm oval hypoechoic mass. LEFT breast ultrasound showed normal fibroglandular tissue at the 10 o'clock position. Ultrasound guided needle biopsy with clip placement on 08/03/2018. Pathology revealed revealed a dilated duct/cyst with sclerosis of the wall in the 2:30 lesion. Sample negative for atypia and malignancy. Lesion in the 3 o'clock position revealed an intraductal papilloma. Sample negative for atypia and malignancy.  Right breast lumpectomy on 08/24/2018 revealed an intraductal papilloma with adjacent biopsy change.  There was sclerotic changes suggestive of cyst/duct wall with adjacent biopsy change.  There was duct ectasia and cysts in the tissue between the 2 biopsy sites.  There was no atypia or malignancy.  Symptomatically, she is doing well.  Complains of persistent neck and bilateral arm pain.  Has seen a neurologist in the past but is not interested in being reevaluated.  Recently stopped her gabapentin because she did not think it was working.  Plan: 1. Review labs. 2. RIGHT breast intraductal papilloma s/p excision. Patient canceled mammogram.  Will need to get this rescheduled. 3. Iron deficiency anemia Hemoglobin 14.5 and hematocrit 41.4  MCV 92.6. Ferritin pending during dictation. We will proceed with B12 injection and call patient with results of ferritin. No IV iron today.  4. B12 deficiency Patient notes improvement with B12 injections. Continue monthly B12 injections (06/02/19). 5. Pulmonary nodules LDCT scan completed on 03/31/2019 revealed lung RADS 2; benign appearance or behavior and recommended a 81-month follow-up.  Burgess Estelle will follow-up.  6. Rectal bleeding Patient denies any bleeding. Colonoscopy performed on 05/04/2019 revealed 2 polyps which were removed  and sent for pathology. No other concerns-no ulcers  7.  Cervical back pain/bilateral lower extremity neuropathy  Previously on Gabapentin. No currently taking.   Per PCP note; refer back to [facility] neurology.   Will make referral given significant worsening pain.  8. Schedule 1 additional month of B12 injections.  Will reach out to Dr. Mike Gip to see if additional B12 is needed after the 5 initially scheduled are complete. 9.   RTC in 3 months for labs (CBC with diff, ferritin) + B12. 10.   RTC in 6 months for MD assessment, labs (CBC with diff, ferritin- day before), and B12.  Addendum: Ferritin level returned at 31.  We will add a repeat ferritin level in approximately 1 month when she returns for vitamin B12 injection +/-IV Venofer.   I discussed the assessment and treatment plan with the patient.  The patient was provided an opportunity to ask questions and all were answered.  The patient agreed with the plan and demonstrated an understanding of the instructions.  The patient was advised to call back or seek an in person evaluation if the symptoms worsen or if the condition fails to improve as anticipated.  Greater than 50% was spent in counseling and coordination of care with this patient including but not limited to discussion of the relevant topics above (See A&P) including, but not limited to diagnosis and management of acute and chronic medical conditions.    Jacquelin Hawking, NP, PhD  07/28/2019, 1:00 PM

## 2019-07-28 NOTE — Progress Notes (Signed)
Patient c/o right neck and shoulder pain. The patient also c/o numbness and tingling to bilateral hands

## 2019-07-31 ENCOUNTER — Telehealth: Payer: Self-pay

## 2019-07-31 NOTE — Telephone Encounter (Signed)
New patient paper work was faxed over to Neurology today. Fax conformation confirmed.

## 2019-08-02 ENCOUNTER — Other Ambulatory Visit: Payer: Self-pay | Admitting: Gastroenterology

## 2019-08-02 ENCOUNTER — Other Ambulatory Visit: Payer: Self-pay | Admitting: Internal Medicine

## 2019-08-02 ENCOUNTER — Other Ambulatory Visit: Payer: Self-pay | Admitting: Oncology

## 2019-08-02 DIAGNOSIS — D241 Benign neoplasm of right breast: Secondary | ICD-10-CM

## 2019-08-02 NOTE — Telephone Encounter (Signed)
Last filled 06-20-19 #90 Last OV 06-20-19 Next OV 06-25-20 [facility]

## 2019-08-16 ENCOUNTER — Ambulatory Visit
Admission: RE | Admit: 2019-08-16 | Discharge: 2019-08-16 | Disposition: A | Discharge status: Home / Self Care | Payer: Medicare HMO | Source: Ambulatory Visit | Attending: Oncology | Admitting: Oncology

## 2019-08-16 ENCOUNTER — Other Ambulatory Visit: Payer: Self-pay

## 2019-08-16 ENCOUNTER — Other Ambulatory Visit: Payer: Self-pay | Admitting: Oncology

## 2019-08-16 DIAGNOSIS — Z1231 Encounter for screening mammogram for malignant neoplasm of breast: Secondary | ICD-10-CM | POA: Diagnosis not present

## 2019-08-16 DIAGNOSIS — D241 Benign neoplasm of right breast: Secondary | ICD-10-CM | POA: Insufficient documentation

## 2019-08-28 ENCOUNTER — Inpatient Hospital Stay: Payer: Medicare HMO | Attending: Hematology and Oncology

## 2019-08-28 ENCOUNTER — Other Ambulatory Visit: Payer: Self-pay

## 2019-08-28 ENCOUNTER — Inpatient Hospital Stay: Payer: Medicare HMO

## 2019-08-28 ENCOUNTER — Other Ambulatory Visit: Payer: Self-pay | Admitting: Hematology and Oncology

## 2019-08-28 VITALS — BP 134/75 | HR 72 | Temp 96.6°F | Resp 18

## 2019-08-28 DIAGNOSIS — Z79899 Other long term (current) drug therapy: Secondary | ICD-10-CM | POA: Insufficient documentation

## 2019-08-28 DIAGNOSIS — I10 Essential (primary) hypertension: Secondary | ICD-10-CM | POA: Insufficient documentation

## 2019-08-28 DIAGNOSIS — M79602 Pain in left arm: Secondary | ICD-10-CM | POA: Diagnosis not present

## 2019-08-28 DIAGNOSIS — K254 Chronic or unspecified gastric ulcer with hemorrhage: Secondary | ICD-10-CM | POA: Insufficient documentation

## 2019-08-28 DIAGNOSIS — Z8541 Personal history of malignant neoplasm of cervix uteri: Secondary | ICD-10-CM | POA: Diagnosis not present

## 2019-08-28 DIAGNOSIS — R0602 Shortness of breath: Secondary | ICD-10-CM | POA: Diagnosis not present

## 2019-08-28 DIAGNOSIS — F17210 Nicotine dependence, cigarettes, uncomplicated: Secondary | ICD-10-CM | POA: Insufficient documentation

## 2019-08-28 DIAGNOSIS — Z833 Family history of diabetes mellitus: Secondary | ICD-10-CM | POA: Insufficient documentation

## 2019-08-28 DIAGNOSIS — Z8719 Personal history of other diseases of the digestive system: Secondary | ICD-10-CM | POA: Diagnosis not present

## 2019-08-28 DIAGNOSIS — N6315 Unspecified lump in the right breast, overlapping quadrants: Secondary | ICD-10-CM | POA: Diagnosis not present

## 2019-08-28 DIAGNOSIS — Z8261 Family history of arthritis: Secondary | ICD-10-CM | POA: Diagnosis not present

## 2019-08-28 DIAGNOSIS — K2270 Barrett's esophagus without dysplasia: Secondary | ICD-10-CM | POA: Insufficient documentation

## 2019-08-28 DIAGNOSIS — D125 Benign neoplasm of sigmoid colon: Secondary | ICD-10-CM | POA: Insufficient documentation

## 2019-08-28 DIAGNOSIS — D509 Iron deficiency anemia, unspecified: Secondary | ICD-10-CM | POA: Insufficient documentation

## 2019-08-28 DIAGNOSIS — Z820 Family history of epilepsy and other diseases of the nervous system: Secondary | ICD-10-CM | POA: Diagnosis not present

## 2019-08-28 DIAGNOSIS — Z800 Family history of malignant neoplasm of digestive organs: Secondary | ICD-10-CM | POA: Insufficient documentation

## 2019-08-28 DIAGNOSIS — Z885 Allergy status to narcotic agent status: Secondary | ICD-10-CM | POA: Insufficient documentation

## 2019-08-28 DIAGNOSIS — M25511 Pain in right shoulder: Secondary | ICD-10-CM | POA: Insufficient documentation

## 2019-08-28 DIAGNOSIS — Z803 Family history of malignant neoplasm of breast: Secondary | ICD-10-CM | POA: Diagnosis not present

## 2019-08-28 DIAGNOSIS — J449 Chronic obstructive pulmonary disease, unspecified: Secondary | ICD-10-CM | POA: Diagnosis not present

## 2019-08-28 DIAGNOSIS — Z818 Family history of other mental and behavioral disorders: Secondary | ICD-10-CM | POA: Diagnosis not present

## 2019-08-28 DIAGNOSIS — E538 Deficiency of other specified B group vitamins: Secondary | ICD-10-CM

## 2019-08-28 DIAGNOSIS — Z8249 Family history of ischemic heart disease and other diseases of the circulatory system: Secondary | ICD-10-CM | POA: Insufficient documentation

## 2019-08-28 DIAGNOSIS — Z8049 Family history of malignant neoplasm of other genital organs: Secondary | ICD-10-CM | POA: Insufficient documentation

## 2019-08-28 DIAGNOSIS — M79601 Pain in right arm: Secondary | ICD-10-CM | POA: Insufficient documentation

## 2019-08-28 DIAGNOSIS — D500 Iron deficiency anemia secondary to blood loss (chronic): Secondary | ICD-10-CM

## 2019-08-28 LAB — FERRITIN: Ferritin: 29 ng/mL (ref 11–307)

## 2019-08-28 MED ORDER — CYANOCOBALAMIN 1000 MCG/ML IJ SOLN
1000.0000 ug | Freq: Once | INTRAMUSCULAR | Status: AC
  Administered 2019-08-28: 1000 ug via INTRAMUSCULAR

## 2019-08-29 ENCOUNTER — Inpatient Hospital Stay: Payer: Medicare HMO

## 2019-08-29 ENCOUNTER — Other Ambulatory Visit: Payer: Self-pay | Admitting: *Deleted

## 2019-08-29 DIAGNOSIS — D500 Iron deficiency anemia secondary to blood loss (chronic): Secondary | ICD-10-CM

## 2019-08-29 LAB — CBC WITH DIFFERENTIAL/PLATELET
Abs Immature Granulocytes: 0.01 10*3/uL (ref 0.00–0.07)
Basophils Absolute: 0.1 10*3/uL (ref 0.0–0.1)
Basophils Relative: 1 %
Eosinophils Absolute: 0.1 10*3/uL (ref 0.0–0.5)
Eosinophils Relative: 2 %
HCT: 43.5 % (ref 36.0–46.0)
Hemoglobin: 15 g/dL (ref 12.0–15.0)
Immature Granulocytes: 0 %
Lymphocytes Relative: 54 %
Lymphs Abs: 4.1 10*3/uL — ABNORMAL HIGH (ref 0.7–4.0)
MCH: 32.3 pg (ref 26.0–34.0)
MCHC: 34.5 g/dL (ref 30.0–36.0)
MCV: 93.5 fL (ref 80.0–100.0)
Monocytes Absolute: 0.6 10*3/uL (ref 0.1–1.0)
Monocytes Relative: 8 %
Neutro Abs: 2.7 10*3/uL (ref 1.7–7.7)
Neutrophils Relative %: 35 %
Platelets: 325 10*3/uL (ref 150–400)
RBC: 4.65 MIL/uL (ref 3.87–5.11)
RDW: 12.6 % (ref 11.5–15.5)
WBC: 7.6 10*3/uL (ref 4.0–10.5)
nRBC: 0 % (ref 0.0–0.2)

## 2019-08-29 NOTE — Progress Notes (Signed)
[facility]  [address], [address] [place], [place] [address] Phone: [phone]  Fax: [phone]   Office Visit: 08/30/2019    Referring Physician: Venia Carbon, MD  Chief Complaint: Molly Peters is a 58 y.o. female with iron deficiency anemia, B12 deficiency, and intra-ductal papilloma who is seen for 1 month assessment and consideration of  Venofer.  HPI:  The patient was last seen in the hematology clinic on 07/28/2019 by Faythe Casa, NP.  At that time, she is doing well.  She noted persistent neck and bilateral arm pain.  She had been seen by a neurologist in the past but was not interested in being reevaluated.  She had rcently stopped her gabapentin because she did not think it was working.  Hematocrit was 41.4, hemoglobin 14.5 and MCV 92.6.  Ferritin was 31.  She received a B12 injection.  Screening bilateral mammogram on 08/16/2019 revealed no evidence of malignancy.  She received B12 on 08/28/2019.  During the interim, she has felt good except for her arm.  She notes right shoulder pain.  She has seen orthopedics (follow-up in 6 months).  She is smoking 1/2 pack a day.  She notes her chronic shortness of breath.  She denies any melena or hematochezia.  Diet is stable.   Past Medical History:  Diagnosis Date  . Anxiety   . Cervical cancer ([place]) 1980's  . Chronic back pain 11/12/2009   Qualifier: Diagnosis of  By: Maxie Better FNP, Rosalita Levan   . COPD (chronic obstructive pulmonary disease) (HCC)    states SOB with ADLs; no O2 use; able to speak in complete sentences without SOB(11/15/2014)  . Depression   . Difficulty swallowing pills    s/p cervical fusion  . Family history of adverse reaction to anesthesia    pt's mother and sister have hx. of post-op N/V  . Fibromyalgia   . GERD (gastroesophageal reflux disease)   . Heart murmur   . History of gastric ulcer   . Hypertension    states under control with med., has been on  med. x 1-2 yr.  . IBS (irritable bowel syndrome)   . Internal hemorrhoid    states has had intermittent bright red bleeding with BM (11/15/2014)  . Intraductal papilloma of breast, right 08/08/2018  . Limited joint range of motion    neck - s/p cervical fusion  . Localized primary osteoarthritis of right shoulder region 11/23/2014  . Osteoarthritis of left shoulder 07/05/2015  . Osteoarthritis of right shoulder 11/2014  . Pneumonia   . Seizures ([place])    last seizure 2010  . Short-term memory loss   . TMJ syndrome   . Valvular heart disease    Initial workup a number of years ago at [facility].  Echo (10/2009) showed EF 60-65%,      normal LV size, moderate aortic insufficiency with a trileaflet aortic valve and normal aortic root size.  Mild      mitral regurgitation.   . Wears dentures    lower    Past Surgical History:  Procedure Laterality Date  . ABDOMINAL HYSTERECTOMY     partial  . ANTERIOR CERVICAL DECOMP/DISCECTOMY FUSION  02/06/2011   C4-5, C5-6, C6-7  . BREAST BIOPSY Bilateral 10+ yrs ago   neg  . BREAST BIOPSY Right 08/03/2018   2 areas bx, -neg  . BREAST LUMPECTOMY Bilateral 1989   x 3 - benign  . BREAST LUMPECTOMY Right 08/24/2018   Procedure: BREAST LUMPECTOMY  WITH ULTRASOUND IN OR;  Surgeon: Vickie Epley, MD;  Location: [facility] ORS;  Service: General;  Laterality: Right;  . CARDIAC CATHETERIZATION  1995  . CESAREAN SECTION     x 2  . COLONOSCOPY  09/28/2008  . COLONOSCOPY N/A 06/15/2018   Procedure: COLONOSCOPY;  Surgeon: Virgel Manifold, MD;  Location: [facility] ENDOSCOPY;  Service: Endoscopy;  Laterality: N/A;  . COLONOSCOPY WITH PROPOFOL N/A 05/04/2019   Procedure: COLONOSCOPY WITH PROPOFOL;  Surgeon: Virgel Manifold, MD;  Location: [facility] ENDOSCOPY;  Service: Endoscopy;  Laterality: N/A;  . ESOPHAGOGASTRODUODENOSCOPY  09/28/2008  . ESOPHAGOGASTRODUODENOSCOPY N/A 06/14/2018   Procedure: ESOPHAGOGASTRODUODENOSCOPY (EGD);  Surgeon: Virgel Manifold, MD;  Location:  [facility] ENDOSCOPY;  Service: Endoscopy;  Laterality: N/A;  . ESOPHAGOGASTRODUODENOSCOPY (EGD) WITH PROPOFOL N/A 05/04/2019   Procedure: ESOPHAGOGASTRODUODENOSCOPY (EGD) WITH PROPOFOL;  Surgeon: Virgel Manifold, MD;  Location: [facility] ENDOSCOPY;  Service: Endoscopy;  Laterality: N/A;  . HARDWARE REMOVAL  11/17/2006   L5-S1  . LAMINECTOMY WITH POSTERIOR LATERAL ARTHRODESIS LEVEL 3  12/06/2009   with synovial cyst resection L3-4 bilat.  Marland Kitchen LAMINECTOMY WITH POSTERIOR LATERAL ARTHRODESIS LEVEL 3  11/17/2006   L4-5  . NM MYOVIEW LTD     Lexiscan myoview (10/2009): EF 77%, normal wall motion, normal perfusion.   Marland Kitchen SHOULDER ARTHROSCOPY WITH DEBRIDEMENT AND BICEP TENDON REPAIR Right 04/13/2014   Procedure: RIGHT SHOULDER ARTHROSCOPY WITH DEBRIDEMENT EXTENSIVE;  Surgeon: Johnny Bridge, MD;  Location: [place];  Service: Orthopedics;  Laterality: Right;  . TOTAL SHOULDER ARTHROPLASTY Right 11/23/2014   Procedure: TOTAL RIGHT SHOULDER ARTHROPLASTY;  Surgeon: Johnny Bridge, MD;  Location: [place];  Service: Orthopedics;  Laterality: Right;  . TOTAL SHOULDER ARTHROPLASTY Left 07/05/2015   Procedure: LEFT TOTAL SHOULDER REPLACEMENT;  Surgeon: Marchia Bond, MD;  Location: [place];  Service: Orthopedics;  Laterality: Left;    Family History  Problem Relation Age of Onset  . Cervical cancer Mother   . Anesthesia problems Mother        post-op N/V  . Rheum arthritis Mother   . Anxiety disorder Mother   . Cancer Father        colon cancer  . Migraines Sister   . Cervical cancer Sister   . Anesthesia problems Sister        post-op N/V  . Migraines Brother   . Asthma Daughter   . Cerebral palsy Daughter        age 14  . Coronary artery disease Maternal Grandmother   . Hypertension Maternal Grandmother   . Diabetes Maternal Grandmother   . Coronary artery disease Paternal Grandmother   . Hypertension Paternal Grandmother   . Diabetes Paternal  Grandmother   . Breast cancer Maternal Aunt   . Breast cancer Maternal Aunt   . Breast cancer Maternal Aunt     Social History:  reports that she has been smoking cigarettes. She has a 42.75 pack-year smoking history. She has never used smokeless tobacco. She reports current alcohol use. She reports current drug use. Drug: Marijuana.  Patient smoked 1.5 packs of cigarettes per day for 40+ years. She drinks "a hair" every now and then. Patient has been on disability in 2005 for "ruptured back". She is a former [employer]. The patient is alone today.   Allergies:  Allergies  Allergen Reactions  . Carbamazepine Hives  . Divalproex Sodium Swelling and Other (See Comments)    HAIR FALLS OUT  . Clonazepam Other (See Comments)  HALLUCINATIONS  . Diphenhydramine Hcl Rash  . Tiotropium Bromide Monohydrate Other (See Comments)    CREATES YEAST IN THROAT  . Vicodin [Hydrocodone-Acetaminophen] Itching    Current Medications: Current Outpatient Medications  Medication Sig Dispense Refill  . albuterol (VENTOLIN HFA) 108 (90 Base) MCG/ACT inhaler Inhale 2 puffs into the lungs every 6 (six) hours as needed for wheezing or shortness of breath.    . ALPRAZolam (XANAX) 1 MG tablet TAKE 1/2 TO 1 TABLET BY MOUTH 3 TIMES DAILY AS NEEDED ANXIETY 90 tablet 0  . lamoTRIgine (LAMICTAL) 100 MG tablet TAKE 1 TABLET BY MOUTH TWICE DAILY 180 tablet 3  . losartan-hydrochlorothiazide (HYZAAR) 100-12.5 MG tablet TAKE 1 TABLET EVERY DAY 90 tablet 1  . pantoprazole (PROTONIX) 20 MG tablet TAKE 1 TABLET BY MOUTH ONCE DAILY 30 tablet 1  . PARoxetine (PAXIL) 20 MG tablet TAKE 1 TABLET BY MOUTH ONCE DAILY 90 tablet 3  . ondansetron (ZOFRAN ODT) 4 MG disintegrating tablet Take 1 tablet (4 mg total) by mouth every 8 (eight) hours as needed for nausea or vomiting. (Patient not taking: Reported on 08/30/2019) 20 tablet 0   No current facility-administered medications for this visit.     Review of Systems  Constitutional:  Positive for weight loss (1 pound). Negative for chills, diaphoresis, fever and malaise/fatigue.       Feels "good except for arm".  HENT: Negative.  Negative for congestion, ear discharge, ear pain, nosebleeds, sinus pain and sore throat.   Eyes: Negative.  Negative for blurred vision, double vision, photophobia, pain, discharge and redness.  Respiratory: Positive for shortness of breath (stable). Negative for cough, hemoptysis, sputum production and wheezing.   Cardiovascular: Negative.  Negative for chest pain, palpitations, orthopnea, leg swelling and PND.  Gastrointestinal: Negative.  Negative for abdominal pain, blood in stool, constipation, diarrhea, melena, nausea and vomiting.  Genitourinary: Negative.  Negative for dysuria, frequency and hematuria.  Musculoskeletal: Positive for joint pain (right shoulder). Negative for back pain, falls, myalgias and neck pain.  Skin: Negative.  Negative for itching and rash.  Neurological: Negative.  Negative for dizziness, tingling, tremors, sensory change, speech change, focal weakness, weakness and headaches.  Endo/Heme/Allergies: Negative.  Does not bruise/bleed easily.  Psychiatric/Behavioral: Negative.  Negative for depression and memory loss. The patient is not nervous/anxious and does not have insomnia.   All other systems reviewed and are negative.  Performance status (ECOG): 1  Blood pressure (!) 161/93, pulse 68, temperature 97.6 F (36.4 C), temperature source Tympanic, resp. rate 18, weight 99 lb 10.4 oz (45.2 kg), SpO2 99 %.   Physical Exam  Constitutional: She is oriented to person, place, and time.  Thin woman sitting comfortably in exam room in no acute distress.  HENT:  Head: Normocephalic and atraumatic.  Mouth/Throat: Oropharynx is clear and moist. No oropharyngeal exudate.  Long gray hair.  Mask.  Eyes: Pupils are equal, round, and reactive to light. Conjunctivae and EOM are normal. No scleral icterus.  Blue eyes.  Neck:  Normal range of motion. Neck supple. No JVD present.  Cardiovascular: Normal rate, regular rhythm and normal heart sounds.  Pulmonary/Chest: Breath sounds normal. No respiratory distress. She has no wheezes. She has no rales.  Abdominal: Soft. Bowel sounds are normal. She exhibits no distension and no mass. There is no abdominal tenderness. There is no rebound and no guarding.  Musculoskeletal:        General: No edema.  Lymphadenopathy:       Head (right side): No  preauricular, no posterior auricular and no occipital adenopathy present.       Head (left side): No preauricular, no posterior auricular and no occipital adenopathy present.    She has no cervical adenopathy.    She has no axillary adenopathy.       Right: No inguinal and no supraclavicular adenopathy present.       Left: No inguinal and no supraclavicular adenopathy present.  Neurological: She is alert and oriented to person, place, and time.  Skin: Skin is warm and dry. No rash noted. She is not diaphoretic. No erythema. No pallor.  Psychiatric: She has a normal mood and affect. Her behavior is normal. Judgment and thought content normal.  Nursing note and vitals reviewed.   Appointment on 08/28/2019  Component Date Value Ref Range Status  . Ferritin 08/28/2019 29  11 - 307 ng/mL Final   Performed at [facility], [address] [place], [place] [address]  . WBC 08/28/2019 7.6  4.0 - 10.5 K/uL Final  . RBC 08/28/2019 4.65  3.87 - 5.11 MIL/uL Final  . Hemoglobin 08/28/2019 15.0  12.0 - 15.0 g/dL Final  . HCT 08/28/2019 43.5  36.0 - 46.0 % Final  . MCV 08/28/2019 93.5  80.0 - 100.0 fL Final  . MCH 08/28/2019 32.3  26.0 - 34.0 pg Final  . MCHC 08/28/2019 34.5  30.0 - 36.0 g/dL Final  . RDW 08/28/2019 12.6  11.5 - 15.5 % Final  . Platelets 08/28/2019 325  150 - 400 K/uL Final  . nRBC 08/28/2019 0.0  0.0 - 0.2 % Final  . Neutrophils Relative % 08/28/2019 35  % Final  . Neutro Abs 08/28/2019 2.7  1.7 - 7.7 K/uL  Final  . Lymphocytes Relative 08/28/2019 54  % Final  . Lymphs Abs 08/28/2019 4.1* 0.7 - 4.0 K/uL Final  . Monocytes Relative 08/28/2019 8  % Final  . Monocytes Absolute 08/28/2019 0.6  0.1 - 1.0 K/uL Final  . Eosinophils Relative 08/28/2019 2  % Final  . Eosinophils Absolute 08/28/2019 0.1  0.0 - 0.5 K/uL Final  . Basophils Relative 08/28/2019 1  % Final  . Basophils Absolute 08/28/2019 0.1  0.0 - 0.1 K/uL Final  . Immature Granulocytes 08/28/2019 0  % Final  . Abs Immature Granulocytes 08/28/2019 0.01  0.00 - 0.07 K/uL Final   Performed at [facility], [address] [place], [place] [address]    Assessment:  NYTIA EVITTS is a 58 y.o. female with iron deficiency anemia secondary to an upper GI bleed.  She presented with intermittent melena and chest pain.  Hemoglobin nadir was 6.9 on 06/14/2018.  Ferritin was 9, iron saturation 11% and TIBC 393 on 06/20/2018.  She received 1 unit of PRBCs.  Work-up on 07/05/2018 revealed a hematocrit of 31, hemoglobin 10.2, and MCV 70.8.  Ferritin was 7.  B12 was 165 (low).  Folate was 6.3 (> 5.9).  TSH was 1.256.  Ferritin has been followed: 9 on 06/20/2018, 7 on 07/05/2018, 5 on 07/19/2018, 81 on 09/12/2018, 12 on 10/24/2018, 71 on 02/07/2019, 51 on 05/09/2019, 32 on 07/28/2019, and 29 on 08/28/2019.   She received Venofer 200 mg on 07/25/2018, weekly x 3 (08/15/2018 - 08/29/2018), ans weekly x 2 (01/10/2019 - 01/17/2019).  She has B12 deficiency.  She began oral B12 on 07/06/2018.  B12 was 165 on 07/05/2018, 396 on 07/19/2018, 421 (after B12 injection) on 09/12/2018, and 246 on 10/24/2018 (on oral B12).  Folate was 6.3  on 07/05/2018 and 9.2 on 09/12/2018. She began monthly B12 on 08/29/2018 (last 08/28/2019).  EGD on 06/14/2018 revealed esophageal mucosal changes c/w short segment of Barrett's and a non-bleeding gastric ulcers.  Pathology at the GE junction revealed squamocolumnar mucosa with moderate chronic inflammation.   Gastric biopsy revealed no H pylori, dysplasia, or malignancy.    Colonoscopy on 06/15/2018 revealed a 5 mm polyp in the sigmoid colon (tubular adenoma).  Repeat colonoscopy was discussed in 6-12 months due to fair prep.   Chest CT angiogram on 06/22/2018 reveled no pulmonary embolism.  There was underlying emphysematous changes.  There were small LUL pulmonary nodular changes (largest 3 mm).  There was no thoracic adenopathy.    CT angiogram of the abdomen and pelvis revealed no evidence of abdominal aortic aneurysm or dissection. No findings to suggest active GI bleeding on CT. There was non-vascular wall thickening involving the transverse colon extending to the splenic flexure, suspicious for infectious/inflammatory colitis.  There was associated trace pelvic ascites. There was no pneumatosis or free air.  C diff and GI panel by PCR was negative.  She was treated with Flagyl and Augmentin.  BILATERAL mammogram on 07/27/2018 revealed 2 indeterminate masses in the RIGHT breast at the 2:30 and 3 o'clock positions. Follow up breast ultrasounds shows a 6 x 4 x 5 mm oval hypoechoic mass, 1 cm from the nipple, in the 2:30 position. In the 3 o'clock position, again 1 cm from the nipple, the was a 6 x 3 x 3 mm oval hypoechoic mass. LEFT breast ultrasound showed normal fibroglandular tissue at the 10 o'clock position. Ultrasound guided needle biopsy with clip placement on 08/03/2018. Pathology revealed revealed a dilated duct/cyst with sclerosis of the wall in the 2:30 lesion. Sample negative for atypia and malignancy. Lesion in the 3 o'clock position revealed an intraductal papilloma. Sample negative for atypia and malignancy.  Right breast lumpectomy on 08/24/2018 revealed an intraductal papilloma with adjacent biopsy change.  There was sclerotic changes suggestive of cyst/duct wall with adjacent biopsy change.  There was duct ectasia and cysts in the tissue between the 2 biopsy sites.  There was no atypia  or malignancy.  Screening bilateral mammogram on 08/16/2019 revealed no evidence of malignancy.  Symptomatically, she feels good except for her right shoulder.  She denies any melena or hematochezia.  Plan: 1.   Review labs from 08/28/2019. 2.   RIGHT breast intraductal papilloma Patient is s/p excision.  Review interval screening bilateral mammogram- no evidence of malignancy.  Continue yearly mammogram. 3.   Iron deficiency anemia Hematocrit 43.5.  Hemoglobin 15.0.  MCV 93.5. Ferritin 29. No Venofer today. Continue to monitor. 4.   B12 deficiency Last B12 on 08/28/2019. Continue monthly B12 injections x 6. 5.   Pulmonary nodules Low dose chest CT on 03/30/2019 revealed lung-RADS 2, benign appearance or behavior. Continue yearly surveillance imaging.  6.   Rectal bleeding Patient denies any bleeding. Continue to monitor. 7.  Right shoulder pain  Follow-up with orthopedics. 8.   RTC on 01/25/2020 for MD assessment and labs (CBC with diff, ferritin- day before) and +/- Venofer.  I discussed the assessment and treatment plan with the patient.  The patient was provided an opportunity to ask questions and all were answered.  The patient agreed with the plan and demonstrated an understanding of the instructions.  The patient was advised to call back or seek an in person evaluation if the symptoms worsen or if the condition fails to improve as  anticipated.   Melissa C. Mike Gip, MD, PhD    08/30/2019, 10:02 AM

## 2019-08-30 ENCOUNTER — Inpatient Hospital Stay: Payer: Medicare HMO

## 2019-08-30 ENCOUNTER — Other Ambulatory Visit: Payer: Self-pay

## 2019-08-30 ENCOUNTER — Encounter: Payer: Self-pay | Admitting: Hematology and Oncology

## 2019-08-30 ENCOUNTER — Inpatient Hospital Stay (HOSPITAL_BASED_OUTPATIENT_CLINIC_OR_DEPARTMENT_OTHER): Payer: Medicare HMO | Admitting: Hematology and Oncology

## 2019-08-30 VITALS — BP 161/93 | HR 68 | Temp 97.6°F | Resp 18 | Wt 99.6 lb

## 2019-08-30 DIAGNOSIS — K006 Disturbances in tooth eruption: Secondary | ICD-10-CM | POA: Diagnosis not present

## 2019-08-30 DIAGNOSIS — J449 Chronic obstructive pulmonary disease, unspecified: Secondary | ICD-10-CM | POA: Diagnosis not present

## 2019-08-30 DIAGNOSIS — D500 Iron deficiency anemia secondary to blood loss (chronic): Secondary | ICD-10-CM | POA: Diagnosis not present

## 2019-08-30 DIAGNOSIS — R0602 Shortness of breath: Secondary | ICD-10-CM | POA: Diagnosis not present

## 2019-08-30 DIAGNOSIS — I10 Essential (primary) hypertension: Secondary | ICD-10-CM | POA: Diagnosis not present

## 2019-08-30 DIAGNOSIS — N6315 Unspecified lump in the right breast, overlapping quadrants: Secondary | ICD-10-CM | POA: Diagnosis not present

## 2019-08-30 DIAGNOSIS — E538 Deficiency of other specified B group vitamins: Secondary | ICD-10-CM | POA: Diagnosis not present

## 2019-08-30 DIAGNOSIS — M79602 Pain in left arm: Secondary | ICD-10-CM | POA: Diagnosis not present

## 2019-08-30 DIAGNOSIS — M25511 Pain in right shoulder: Secondary | ICD-10-CM | POA: Diagnosis not present

## 2019-08-30 DIAGNOSIS — Z122 Encounter for screening for malignant neoplasm of respiratory organs: Secondary | ICD-10-CM

## 2019-08-30 DIAGNOSIS — D509 Iron deficiency anemia, unspecified: Secondary | ICD-10-CM | POA: Diagnosis not present

## 2019-08-30 DIAGNOSIS — M79601 Pain in right arm: Secondary | ICD-10-CM | POA: Diagnosis not present

## 2019-08-30 LAB — PROTIME-INR
INR: 1 (ref 0.8–1.2)
Prothrombin Time: 13.4 seconds (ref 11.4–15.2)

## 2019-08-30 NOTE — Progress Notes (Signed)
Patient here for follow up. Reports pain in bilateral shoulders with more pain in right shoulder x "months". PCP and orthopedic surgeon is aware. Denies any other issues.

## 2019-09-22 ENCOUNTER — Other Ambulatory Visit: Payer: Self-pay | Admitting: Internal Medicine

## 2019-09-22 ENCOUNTER — Other Ambulatory Visit: Payer: Self-pay

## 2019-09-25 ENCOUNTER — Other Ambulatory Visit: Payer: Self-pay

## 2019-09-25 ENCOUNTER — Inpatient Hospital Stay: Payer: Medicare HMO | Attending: Hematology and Oncology

## 2019-09-25 VITALS — BP 153/97 | HR 76 | Temp 98.4°F | Resp 16

## 2019-09-25 DIAGNOSIS — Z833 Family history of diabetes mellitus: Secondary | ICD-10-CM | POA: Diagnosis not present

## 2019-09-25 DIAGNOSIS — Z803 Family history of malignant neoplasm of breast: Secondary | ICD-10-CM | POA: Diagnosis not present

## 2019-09-25 DIAGNOSIS — Z8249 Family history of ischemic heart disease and other diseases of the circulatory system: Secondary | ICD-10-CM | POA: Diagnosis not present

## 2019-09-25 DIAGNOSIS — M25511 Pain in right shoulder: Secondary | ICD-10-CM | POA: Diagnosis not present

## 2019-09-25 DIAGNOSIS — K625 Hemorrhage of anus and rectum: Secondary | ICD-10-CM | POA: Insufficient documentation

## 2019-09-25 DIAGNOSIS — Z8261 Family history of arthritis: Secondary | ICD-10-CM | POA: Insufficient documentation

## 2019-09-25 DIAGNOSIS — R911 Solitary pulmonary nodule: Secondary | ICD-10-CM | POA: Insufficient documentation

## 2019-09-25 DIAGNOSIS — D509 Iron deficiency anemia, unspecified: Secondary | ICD-10-CM | POA: Insufficient documentation

## 2019-09-25 DIAGNOSIS — D369 Benign neoplasm, unspecified site: Secondary | ICD-10-CM | POA: Insufficient documentation

## 2019-09-25 DIAGNOSIS — Z800 Family history of malignant neoplasm of digestive organs: Secondary | ICD-10-CM | POA: Diagnosis not present

## 2019-09-25 DIAGNOSIS — Z836 Family history of other diseases of the respiratory system: Secondary | ICD-10-CM | POA: Diagnosis not present

## 2019-09-25 DIAGNOSIS — Z79899 Other long term (current) drug therapy: Secondary | ICD-10-CM | POA: Diagnosis not present

## 2019-09-25 DIAGNOSIS — R0602 Shortness of breath: Secondary | ICD-10-CM | POA: Insufficient documentation

## 2019-09-25 DIAGNOSIS — E538 Deficiency of other specified B group vitamins: Secondary | ICD-10-CM | POA: Insufficient documentation

## 2019-09-25 DIAGNOSIS — Z8049 Family history of malignant neoplasm of other genital organs: Secondary | ICD-10-CM | POA: Diagnosis not present

## 2019-09-25 DIAGNOSIS — D500 Iron deficiency anemia secondary to blood loss (chronic): Secondary | ICD-10-CM

## 2019-09-25 MED ORDER — CYANOCOBALAMIN 1000 MCG/ML IJ SOLN
1000.0000 ug | Freq: Once | INTRAMUSCULAR | Status: AC
  Administered 2019-09-25: 1000 ug via INTRAMUSCULAR
  Filled 2019-09-25: qty 1

## 2019-09-25 NOTE — Patient Instructions (Signed)

## 2019-09-25 NOTE — Telephone Encounter (Signed)
Last filled 08-03-19 Last OV 06-20-19 Next OV 06-25-20 [facility]

## 2019-09-25 NOTE — Telephone Encounter (Signed)
Pt called checking on rx [facility] number [phone]

## 2019-10-12 ENCOUNTER — Telehealth: Payer: Self-pay | Admitting: Internal Medicine

## 2019-10-12 MED ORDER — LOSARTAN POTASSIUM-HCTZ 100-12.5 MG PO TABS: 1.0000 | ORAL_TABLET | Freq: Every day | ORAL | 3 refills | Status: DC

## 2019-10-12 NOTE — Telephone Encounter (Signed)
Pt called needing to get a refill on  Losartan  [facility]  Pt has about 5 days left

## 2019-10-12 NOTE — Telephone Encounter (Signed)
Rx sent electronically.  

## 2019-10-19 DIAGNOSIS — H524 Presbyopia: Secondary | ICD-10-CM | POA: Diagnosis not present

## 2019-10-27 ENCOUNTER — Other Ambulatory Visit: Payer: Self-pay

## 2019-10-27 ENCOUNTER — Inpatient Hospital Stay: Payer: Medicare HMO

## 2019-10-27 ENCOUNTER — Inpatient Hospital Stay: Payer: Medicare HMO | Attending: Oncology

## 2019-10-27 VITALS — BP 165/80 | HR 80 | Temp 97.0°F | Resp 16

## 2019-10-27 DIAGNOSIS — E538 Deficiency of other specified B group vitamins: Secondary | ICD-10-CM | POA: Diagnosis not present

## 2019-10-27 DIAGNOSIS — D500 Iron deficiency anemia secondary to blood loss (chronic): Secondary | ICD-10-CM | POA: Insufficient documentation

## 2019-10-27 DIAGNOSIS — F17210 Nicotine dependence, cigarettes, uncomplicated: Secondary | ICD-10-CM | POA: Insufficient documentation

## 2019-10-27 DIAGNOSIS — Z803 Family history of malignant neoplasm of breast: Secondary | ICD-10-CM | POA: Diagnosis not present

## 2019-10-27 DIAGNOSIS — R0602 Shortness of breath: Secondary | ICD-10-CM | POA: Diagnosis not present

## 2019-10-27 DIAGNOSIS — Z833 Family history of diabetes mellitus: Secondary | ICD-10-CM | POA: Insufficient documentation

## 2019-10-27 DIAGNOSIS — M79601 Pain in right arm: Secondary | ICD-10-CM | POA: Insufficient documentation

## 2019-10-27 DIAGNOSIS — D241 Benign neoplasm of right breast: Secondary | ICD-10-CM | POA: Diagnosis not present

## 2019-10-27 DIAGNOSIS — Z8261 Family history of arthritis: Secondary | ICD-10-CM | POA: Insufficient documentation

## 2019-10-27 DIAGNOSIS — Z818 Family history of other mental and behavioral disorders: Secondary | ICD-10-CM | POA: Diagnosis not present

## 2019-10-27 DIAGNOSIS — K922 Gastrointestinal hemorrhage, unspecified: Secondary | ICD-10-CM | POA: Insufficient documentation

## 2019-10-27 DIAGNOSIS — M25511 Pain in right shoulder: Secondary | ICD-10-CM | POA: Insufficient documentation

## 2019-10-27 DIAGNOSIS — M79602 Pain in left arm: Secondary | ICD-10-CM | POA: Diagnosis not present

## 2019-10-27 DIAGNOSIS — Z8249 Family history of ischemic heart disease and other diseases of the circulatory system: Secondary | ICD-10-CM | POA: Diagnosis not present

## 2019-10-27 DIAGNOSIS — R911 Solitary pulmonary nodule: Secondary | ICD-10-CM | POA: Diagnosis not present

## 2019-10-27 DIAGNOSIS — M542 Cervicalgia: Secondary | ICD-10-CM | POA: Insufficient documentation

## 2019-10-27 DIAGNOSIS — Z836 Family history of other diseases of the respiratory system: Secondary | ICD-10-CM | POA: Diagnosis not present

## 2019-10-27 DIAGNOSIS — F1290 Cannabis use, unspecified, uncomplicated: Secondary | ICD-10-CM | POA: Diagnosis not present

## 2019-10-27 DIAGNOSIS — Z800 Family history of malignant neoplasm of digestive organs: Secondary | ICD-10-CM | POA: Diagnosis not present

## 2019-10-27 DIAGNOSIS — I10 Essential (primary) hypertension: Secondary | ICD-10-CM | POA: Insufficient documentation

## 2019-10-27 DIAGNOSIS — J449 Chronic obstructive pulmonary disease, unspecified: Secondary | ICD-10-CM | POA: Diagnosis not present

## 2019-10-27 DIAGNOSIS — Z8049 Family history of malignant neoplasm of other genital organs: Secondary | ICD-10-CM | POA: Diagnosis not present

## 2019-10-27 DIAGNOSIS — Z79899 Other long term (current) drug therapy: Secondary | ICD-10-CM | POA: Insufficient documentation

## 2019-10-27 LAB — CBC WITH DIFFERENTIAL/PLATELET
Abs Immature Granulocytes: 0.02 10*3/uL (ref 0.00–0.07)
Basophils Absolute: 0.1 10*3/uL (ref 0.0–0.1)
Basophils Relative: 1 %
Eosinophils Absolute: 0.1 10*3/uL (ref 0.0–0.5)
Eosinophils Relative: 2 %
HCT: 43.4 % (ref 36.0–46.0)
Hemoglobin: 15.3 g/dL — ABNORMAL HIGH (ref 12.0–15.0)
Immature Granulocytes: 0 %
Lymphocytes Relative: 45 %
Lymphs Abs: 4 10*3/uL (ref 0.7–4.0)
MCH: 32.6 pg (ref 26.0–34.0)
MCHC: 35.3 g/dL (ref 30.0–36.0)
MCV: 92.5 fL (ref 80.0–100.0)
Monocytes Absolute: 0.6 10*3/uL (ref 0.1–1.0)
Monocytes Relative: 7 %
Neutro Abs: 4 10*3/uL (ref 1.7–7.7)
Neutrophils Relative %: 45 %
Platelets: 296 10*3/uL (ref 150–400)
RBC: 4.69 MIL/uL (ref 3.87–5.11)
RDW: 12.1 % (ref 11.5–15.5)
WBC: 8.9 10*3/uL (ref 4.0–10.5)
nRBC: 0 % (ref 0.0–0.2)

## 2019-10-27 LAB — FERRITIN: Ferritin: 24 ng/mL (ref 11–307)

## 2019-10-27 MED ORDER — CYANOCOBALAMIN 1000 MCG/ML IJ SOLN
1000.0000 ug | Freq: Once | INTRAMUSCULAR | Status: AC
  Administered 2019-10-27: 1000 ug via INTRAMUSCULAR

## 2019-11-07 ENCOUNTER — Other Ambulatory Visit: Payer: Self-pay | Admitting: Internal Medicine

## 2019-11-07 NOTE — Telephone Encounter (Signed)
Last filled 09-25-19 #90 Last OV 06-20-19 Next OV 06-25-20 [facility]

## 2019-11-23 ENCOUNTER — Other Ambulatory Visit: Payer: Self-pay

## 2019-11-24 ENCOUNTER — Inpatient Hospital Stay: Payer: Medicare HMO | Attending: Hematology and Oncology

## 2019-11-24 ENCOUNTER — Other Ambulatory Visit: Payer: Self-pay

## 2019-11-24 VITALS — BP 138/89 | HR 81 | Temp 97.5°F | Resp 16

## 2019-11-24 DIAGNOSIS — D509 Iron deficiency anemia, unspecified: Secondary | ICD-10-CM | POA: Diagnosis not present

## 2019-11-24 DIAGNOSIS — D241 Benign neoplasm of right breast: Secondary | ICD-10-CM | POA: Diagnosis not present

## 2019-11-24 DIAGNOSIS — D500 Iron deficiency anemia secondary to blood loss (chronic): Secondary | ICD-10-CM

## 2019-11-24 DIAGNOSIS — R918 Other nonspecific abnormal finding of lung field: Secondary | ICD-10-CM | POA: Diagnosis not present

## 2019-11-24 DIAGNOSIS — E538 Deficiency of other specified B group vitamins: Secondary | ICD-10-CM | POA: Diagnosis not present

## 2019-11-24 DIAGNOSIS — Z8719 Personal history of other diseases of the digestive system: Secondary | ICD-10-CM | POA: Diagnosis not present

## 2019-11-24 DIAGNOSIS — Z79899 Other long term (current) drug therapy: Secondary | ICD-10-CM | POA: Diagnosis not present

## 2019-11-24 DIAGNOSIS — M25511 Pain in right shoulder: Secondary | ICD-10-CM | POA: Insufficient documentation

## 2019-11-24 MED ORDER — CYANOCOBALAMIN 1000 MCG/ML IJ SOLN
1000.0000 ug | Freq: Once | INTRAMUSCULAR | Status: AC
  Administered 2019-11-24: 1000 ug via INTRAMUSCULAR

## 2019-12-18 DIAGNOSIS — H40153 Residual stage of open-angle glaucoma, bilateral: Secondary | ICD-10-CM | POA: Diagnosis not present

## 2019-12-20 ENCOUNTER — Other Ambulatory Visit: Payer: Self-pay | Admitting: Internal Medicine

## 2019-12-20 NOTE — Telephone Encounter (Signed)
Last filled 11-07-19 #90 Last OV 06-20-19 Next OV 06-25-20 [facility]

## 2019-12-21 ENCOUNTER — Other Ambulatory Visit: Payer: Self-pay

## 2019-12-22 ENCOUNTER — Inpatient Hospital Stay: Payer: Medicare HMO | Attending: Hematology and Oncology

## 2019-12-22 VITALS — BP 150/97 | HR 75 | Temp 97.0°F | Resp 16

## 2019-12-22 DIAGNOSIS — D500 Iron deficiency anemia secondary to blood loss (chronic): Secondary | ICD-10-CM | POA: Insufficient documentation

## 2019-12-22 DIAGNOSIS — E538 Deficiency of other specified B group vitamins: Secondary | ICD-10-CM | POA: Insufficient documentation

## 2019-12-22 DIAGNOSIS — M25511 Pain in right shoulder: Secondary | ICD-10-CM | POA: Diagnosis not present

## 2019-12-22 DIAGNOSIS — Z79899 Other long term (current) drug therapy: Secondary | ICD-10-CM | POA: Insufficient documentation

## 2019-12-22 DIAGNOSIS — K922 Gastrointestinal hemorrhage, unspecified: Secondary | ICD-10-CM | POA: Diagnosis not present

## 2019-12-22 DIAGNOSIS — R911 Solitary pulmonary nodule: Secondary | ICD-10-CM | POA: Diagnosis not present

## 2019-12-22 DIAGNOSIS — D241 Benign neoplasm of right breast: Secondary | ICD-10-CM | POA: Insufficient documentation

## 2019-12-22 MED ORDER — CYANOCOBALAMIN 1000 MCG/ML IJ SOLN
1000.0000 ug | Freq: Once | INTRAMUSCULAR | Status: AC
  Administered 2019-12-22: 14:00:00 1000 ug via INTRAMUSCULAR

## 2020-01-23 NOTE — Progress Notes (Signed)
[facility]  [address], [address] [place], [place] [address] Phone: [phone]  Fax: [phone]   Telemedicine Office Visit:  01/25/2020  Referring physician: Venia Carbon, MD  I connected with Molly Peters on 01/25/2020 at 1:53 PM by videoconferencing and verified that I was speaking with the correct person using 2 identifiers.  The patient was at home.  I discussed the limitations, risk, security and privacy concerns of performing an evaluation and management service by videoconferencing and the availability of in person appointments.  I also discussed with the patient that there may be a patient responsible charge related to this service.  The patient expressed understanding and agreed to proceed.   Chief Complaint: Molly Peters is a 59 y.o. female with iron deficiency anemia and B12 deficiency who is seen for a 5 month assessment.  HPI: The patient was last seen in the hematology clinic on 08/30/2019. At that time, she felt good except for her right shoulder. She denied any melena or hematochezia. Hematocrit was 43.5, hemoglobin 15.0, MCV 93.5, platelets 325,000, WBC 7,600 (ALC 4,100).  Ferritin was 29. Patient did not receive any IV iron.  Patient received monthly B12 injection x 4 (09/25/2019 - 12/22/2019).   Labs followed: 10/27/2019: Hematocrit 43.4, hemoglobin 15.3, MCV 92.5, platelets 296,000, WBC 8,900.  Ferritin was 24. 01/24/2020:  Hematocrit 42.6, hemoglobin 15.0, MCV 92.6, platelets 343,000, WBC 7,600. Ferritin was 15.   During the interim, she felt has "tired". She is waking up every 2 hours at night. She denies sleep apnea. However, she states that her husband tells her that she snores "really bad".  Her fatigue all the time is affecting her thinking.  She reports having nausea and wants her Zofran and Protonix refilled.  She continues to have right neck and right shoulder pain.  Her right sided neck pain increases with  movement. She denies melena, hematochezia, hematuria or vaginal bleeding.   Patient is smoking a little over half a pack of cigarettes a day. Her shortness of breath is stable. Patient reports eating well, but continuing to lose weight. Her current weight is 95 pounds.  Her baseline is 105-108 pounds.  Patient agreed to 1 infusion of Venofer tomorrow as her ferritin is < 30.  She will receive a B12 injection. I informed patient to make sure she is getting her B12 injection monthly. We discussed low dose chest CT annually (due 03/29/2020).  We discussed follow-up with Dr Bonna Gains of GI; patient to call and make an appointment.  Thyroid function tests will be drawn today to assess her weight loss.    Past Medical History:  Diagnosis Date   Anxiety    Cervical cancer ([place]) 1980's   Chronic back pain 11/12/2009   Qualifier: Diagnosis of  By: Maxie Better FNP, Rosalita Levan    COPD (chronic obstructive pulmonary disease) ([place])    states SOB with ADLs; no O2 use; able to speak in complete sentences without SOB(11/15/2014)   Depression    Difficulty swallowing pills    s/p cervical fusion   Family history of adverse reaction to anesthesia    pt's mother and sister have hx. of post-op N/V   Fibromyalgia    GERD (gastroesophageal reflux disease)    Heart murmur    History of gastric ulcer    Hypertension    states under control with med., has been on med. x 1-2 yr.   IBS (irritable bowel syndrome)    Internal hemorrhoid  states has had intermittent bright red bleeding with BM (11/15/2014)   Intraductal papilloma of breast, right 08/08/2018   Limited joint range of motion    neck - s/p cervical fusion   Localized primary osteoarthritis of right shoulder region 11/23/2014   Osteoarthritis of left shoulder 07/05/2015   Osteoarthritis of right shoulder 11/2014   Pneumonia    Seizures ([place])    last seizure 2010   Short-term memory loss    TMJ syndrome    Valvular heart  disease    Initial workup a number of years ago at [facility].  Echo (10/2009) showed EF 60-65%,      normal LV size, moderate aortic insufficiency with a trileaflet aortic valve and normal aortic root size.  Mild      mitral regurgitation.    Wears dentures    lower    Past Surgical History:  Procedure Laterality Date   ABDOMINAL HYSTERECTOMY     partial   ANTERIOR CERVICAL DECOMP/DISCECTOMY FUSION  02/06/2011   C4-5, C5-6, C6-7   BREAST BIOPSY Bilateral 10+ yrs ago   neg   BREAST BIOPSY Right 08/03/2018   2 areas bx, -neg   BREAST LUMPECTOMY Bilateral 1989   x 3 - benign   BREAST LUMPECTOMY Right 08/24/2018   Procedure: BREAST LUMPECTOMY WITH ULTRASOUND IN OR;  Surgeon: Vickie Epley, MD;  Location: [facility] ORS;  Service: General;  Laterality: Right;   [place]     x 2   COLONOSCOPY  09/28/2008   COLONOSCOPY N/A 06/15/2018   Procedure: COLONOSCOPY;  Surgeon: Virgel Manifold, MD;  Location: [facility] ENDOSCOPY;  Service: Endoscopy;  Laterality: N/A;   COLONOSCOPY WITH PROPOFOL N/A 05/04/2019   Procedure: COLONOSCOPY WITH PROPOFOL;  Surgeon: Virgel Manifold, MD;  Location: [facility] ENDOSCOPY;  Service: Endoscopy;  Laterality: N/A;   ESOPHAGOGASTRODUODENOSCOPY  09/28/2008   ESOPHAGOGASTRODUODENOSCOPY N/A 06/14/2018   Procedure: ESOPHAGOGASTRODUODENOSCOPY (EGD);  Surgeon: Virgel Manifold, MD;  Location: [facility] ENDOSCOPY;  Service: Endoscopy;  Laterality: N/A;   ESOPHAGOGASTRODUODENOSCOPY (EGD) WITH PROPOFOL N/A 05/04/2019   Procedure: ESOPHAGOGASTRODUODENOSCOPY (EGD) WITH PROPOFOL;  Surgeon: Virgel Manifold, MD;  Location: [facility] ENDOSCOPY;  Service: Endoscopy;  Laterality: N/A;   HARDWARE REMOVAL  11/17/2006   L5-S1   LAMINECTOMY WITH POSTERIOR LATERAL ARTHRODESIS LEVEL 3  12/06/2009   with synovial cyst resection L3-4 bilat.   LAMINECTOMY WITH POSTERIOR LATERAL ARTHRODESIS LEVEL 3  11/17/2006   L4-5   NM MYOVIEW LTD     Lexiscan  myoview (10/2009): EF 77%, normal wall motion, normal perfusion.    SHOULDER ARTHROSCOPY WITH DEBRIDEMENT AND BICEP TENDON REPAIR Right 04/13/2014   Procedure: RIGHT SHOULDER ARTHROSCOPY WITH DEBRIDEMENT EXTENSIVE;  Surgeon: Johnny Bridge, MD;  Location: [place];  Service: Orthopedics;  Laterality: Right;   TOTAL SHOULDER ARTHROPLASTY Right 11/23/2014   Procedure: TOTAL RIGHT SHOULDER ARTHROPLASTY;  Surgeon: Johnny Bridge, MD;  Location: [place];  Service: Orthopedics;  Laterality: Right;   TOTAL SHOULDER ARTHROPLASTY Left 07/05/2015   Procedure: LEFT TOTAL SHOULDER REPLACEMENT;  Surgeon: Marchia Bond, MD;  Location: [place];  Service: Orthopedics;  Laterality: Left;    Family History  Problem Relation Age of Onset   Cervical cancer Mother    Anesthesia problems Mother        post-op N/V   Rheum arthritis Mother    Anxiety disorder Mother    Cancer Father        colon  cancer   Migraines Sister    Cervical cancer Sister    Anesthesia problems Sister        post-op N/V   Migraines Brother    Asthma Daughter    Cerebral palsy Daughter        age 11   Coronary artery disease Maternal Grandmother    Hypertension Maternal Grandmother    Diabetes Maternal Grandmother    Coronary artery disease Paternal Grandmother    Hypertension Paternal Grandmother    Diabetes Paternal Grandmother    Breast cancer Maternal Aunt    Breast cancer Maternal Aunt    Breast cancer Maternal Aunt     Social History:  reports that she has been smoking cigarettes. She has a 42.75 pack-year smoking history. She has never used smokeless tobacco. She reports current alcohol use. She reports current drug use. Drug: Marijuana. Patient smoked 1.5 packs of cigarettes per day for 40+ years. She drinks "a hair" every now and then. Patient has been on disability in 2005 for "ruptured back". She is a former [employer]. The patient is alone  today.  Participants in the patient's visit and their role in the encounter included the patient and Roxie Ellington, EN, today.  The intake visit was provided by Orlene Och, RN.  Allergies:  Allergies  Allergen Reactions   Carbamazepine Hives   Divalproex Sodium Swelling and Other (See Comments)    HAIR FALLS OUT   Clonazepam Other (See Comments)    HALLUCINATIONS   Diphenhydramine Hcl Rash   Tiotropium Bromide Monohydrate Other (See Comments)    CREATES YEAST IN THROAT   Vicodin [Hydrocodone-Acetaminophen] Itching    Current Medications: Current Outpatient Medications  Medication Sig Dispense Refill   ALPRAZolam (XANAX) 1 MG tablet TAKE 1/2 TO 1 TABLET BY MOUTH 3 TIMES DAILY AS NEEDED ANXIETY 90 tablet 0   lamoTRIgine (LAMICTAL) 100 MG tablet TAKE 1 TABLET BY MOUTH TWICE DAILY 180 tablet 3   latanoprost (XALATAN) 0.005 % ophthalmic solution      losartan-hydrochlorothiazide (HYZAAR) 100-12.5 MG tablet Take 1 tablet by mouth daily. 90 tablet 3   PARoxetine (PAXIL) 20 MG tablet TAKE 1 TABLET BY MOUTH ONCE DAILY 90 tablet 3   albuterol (VENTOLIN HFA) 108 (90 Base) MCG/ACT inhaler Inhale 2 puffs into the lungs every 6 (six) hours as needed for wheezing or shortness of breath.     ondansetron (ZOFRAN ODT) 4 MG disintegrating tablet Take 1 tablet (4 mg total) by mouth every 8 (eight) hours as needed for nausea or vomiting. 20 tablet 0   pantoprazole (PROTONIX) 20 MG tablet Take 1 tablet (20 mg total) by mouth daily. 30 tablet 1   No current facility-administered medications for this visit.    Review of Systems  Constitutional: Positive for malaise/fatigue and weight loss (currently 95 lbs; unintentional weight loss; baseline 105-108 lbs). Negative for chills, diaphoresis and fever.       Feels "tired".  HENT: Negative.  Negative for congestion, ear discharge, ear pain, nosebleeds, sinus pain and sore throat.   Eyes: Negative.  Negative for blurred vision, double  vision, photophobia, pain, discharge and redness.  Respiratory: Positive for shortness of breath (stable). Negative for cough, hemoptysis, sputum production and wheezing.   Cardiovascular: Negative.  Negative for chest pain, palpitations, orthopnea, leg swelling and PND.  Gastrointestinal: Positive for nausea. Negative for abdominal pain, blood in stool, constipation, diarrhea, melena and vomiting.       Eating well.  Genitourinary: Negative.  Negative for dysuria, frequency  and hematuria.  Musculoskeletal: Positive for joint pain (right shoulder) and neck pain (right side; pain increases with movement). Negative for back pain, falls and myalgias.  Skin: Negative.  Negative for itching and rash.  Neurological: Negative.  Negative for dizziness, tingling, tremors, sensory change, speech change, focal weakness, weakness and headaches.       Change in thinking from feeling tired all the time.  Endo/Heme/Allergies: Negative.  Does not bruise/bleed easily.  Psychiatric/Behavioral: Negative for depression and memory loss. The patient has insomnia (waking up every 2 hours at night). The patient is not nervous/anxious.   All other systems reviewed and are negative.  Performance status (ECOG):  1  Physical Exam  Constitutional: She is oriented to person, place, and time.  Thin woman sitting comfortably at home in no acute distress.  HENT:  Head: Normocephalic and atraumatic.  Mouth/Throat: Oropharynx is clear and moist. No oropharyngeal exudate.  Long gray hair.  Eyes: Conjunctivae and EOM are normal. No scleral icterus.  Glasses.  Blue eyes.  Neurological: She is alert and oriented to person, place, and time.  Skin: She is not diaphoretic.  Psychiatric: She has a normal mood and affect. Her behavior is normal. Judgment and thought content normal.  Nursing note and vitals reviewed.   Appointment on 01/24/2020  Component Date Value Ref Range Status   Ferritin 01/24/2020 15  11 - 307 ng/mL  Final   Performed at [facility], [address] [place], [place] [address]   WBC 01/24/2020 7.6  4.0 - 10.5 K/uL Final   RBC 01/24/2020 4.60  3.87 - 5.11 MIL/uL Final   Hemoglobin 01/24/2020 15.0  12.0 - 15.0 g/dL Final   HCT 01/24/2020 42.6  36.0 - 46.0 % Final   MCV 01/24/2020 92.6  80.0 - 100.0 fL Final   MCH 01/24/2020 32.6  26.0 - 34.0 pg Final   MCHC 01/24/2020 35.2  30.0 - 36.0 g/dL Final   RDW 01/24/2020 12.7  11.5 - 15.5 % Final   Platelets 01/24/2020 343  150 - 400 K/uL Final   nRBC 01/24/2020 0.0  0.0 - 0.2 % Final   Neutrophils Relative % 01/24/2020 38  % Final   Neutro Abs 01/24/2020 2.9  1.7 - 7.7 K/uL Final   Lymphocytes Relative 01/24/2020 50  % Final   Lymphs Abs 01/24/2020 3.8  0.7 - 4.0 K/uL Final   Monocytes Relative 01/24/2020 8  % Final   Monocytes Absolute 01/24/2020 0.6  0.1 - 1.0 K/uL Final   Eosinophils Relative 01/24/2020 2  % Final   Eosinophils Absolute 01/24/2020 0.1  0.0 - 0.5 K/uL Final   Basophils Relative 01/24/2020 2  % Final   Basophils Absolute 01/24/2020 0.1  0.0 - 0.1 K/uL Final   Immature Granulocytes 01/24/2020 0  % Final   Abs Immature Granulocytes 01/24/2020 0.01  0.00 - 0.07 K/uL Final   Performed at [facility], [address], [place], [place] [address]    Assessment:  Molly Peters is a 59 y.o. female withiron deficiency anemiasecondary to an upper GI bleed. She presented with intermittent melena and chest pain. Hemoglobin nadir was 6.9 on 06/14/2018.Ferritin was 9, iron saturation 11% and TIBC 393 on 06/20/2018. She received 1 unit of PRBCs.  Work-up on 08/27/2019revealed a hematocrit of 31, hemoglobin 10.2, and MCV 70.8. Ferritin was 7. B12 was 165 (low). Folate was 6.3 (>5.9). TSH was 1.256.  Ferritinhas been followed: 9 on 06/20/2018, 7 on 07/05/2018, 5 on 07/19/2018, 81  on 09/12/2018, 12 on 10/24/2018, 71 on 02/07/2019, 51 on 05/09/2019, 32 on 07/28/2019, 29 on  08/28/2019, and 15 on 01/24/2020.   She received Venofer200 mg on 07/25/2018, weekly x 3 (08/15/2018 - 08/29/2018), and weekly x 2 (01/10/2019 - 01/17/2019).  She has B12 deficiency. She began oral B12on 07/06/2018. B12 was 165 on 07/05/2018, 396 on 07/19/2018, 421 (after B12 injection) on 09/12/2018, and 246 on 10/24/2018 (on oral B12). Folatewas 6.3 on 07/05/2018 and 9.2 on 09/12/2018. She began monthly B12on 08/29/2018 (last 08/28/2019).  EGDon 06/14/2018 revealed esophageal mucosal changes c/w short segment of Barrett's and a non-bleeding gastric ulcers. Pathology at the GE junction revealed squamocolumnar mucosa with moderate chronic inflammation. Gastric biopsy revealed no H pylori, dysplasia, or malignancy. EGD on 05/04/2019 revealed non-bleeding gastric ulcer with a clean ulcer base (Forrest Class III) and friable gastric mucosa.  Biopsies revealed antral mild reactive gastropathy with no H pylori, metaplasia, dysplasia or malignancy.  Colonoscopyon 06/15/2018 revealed a 5 mm polyp in the sigmoid colon (tubular adenoma). Colonoscopy on 05/04/2019 revealed three 4 - 5 mm polyps in the sigmoid colon (hyperplastic polyps) and diverticulosis in the sigmoid colon.   Chest CT angiogramon 06/22/2018 reveled no pulmonary embolism. There was underlying emphysematous changes. There were small LUL pulmonary nodular changes (largest 3 mm). There was no thoracic adenopathy.   CT angiogramof the abdomen and pelvis on 08/08/2018 revealed no evidence of abdominal aortic aneurysm or dissection. No findings to suggest active GI bleeding on CT. There was non-vascular wall thickening involving the transverse colon extending to the splenic flexure, suspicious for infectious/inflammatory colitis. There was associated trace pelvic ascites. There was no pneumatosis or free air. C diff and GI panel by PCR was negative. She was treated with Flagyl and Augmentin.  BILATERAL mammogramon  07/27/2018 revealed 2 indeterminate masses in the RIGHT breast at the 2:30 and 3 o'clock positions. Follow up breast ultrasoundsshows a 6 x 4 x 5 mm oval hypoechoic mass, 1 cm from the nipple, in the 2:30 position. In the 3 o'clock position, again 1 cm from the nipple, the was a 6 x 3 x 3 mm oval hypoechoic mass. LEFT breast ultrasound showed normal fibroglandular tissue at the 10 o'clock position. Ultrasound guided needle biopsy with clip placement on 08/03/2018. Pathology revealed revealed a dilated duct/cyst with sclerosis of the wall in the 2:30 lesion. Sample negative for atypia and malignancy. Lesion in the 3 o'clock position revealed an intraductal papilloma. Sample negative for atypia and malignancy.  Right breast lumpectomy on 08/24/2018 revealed an intraductal papillomawith adjacent biopsy change. There was sclerotic changes suggestive of cyst/duct wall with adjacent biopsy change. There was duct ectasia and cysts in the tissue between the 2 biopsy sites. There was no atypia or malignancy.  Screening bilateral mammogram on 08/16/2019 revealed no evidence of malignancy.  Symptomatically, she is fatigued.  She may have sleep apnea.  She has nausea.  She is eating well but is losing weight.  She smokes > 1/2 ppd.  Plan: 1.   Review labs from 01/24/2020. 2.  Iron deficiency anemia Hematocrit 42.6.  Hemoglobin 15.0.  MCV 92.6.  Ferritin15. Per patient, diet is good. She denies any bleeding. Suspect hemoglobin is good secondary to smoking. Discuss Venofer x 1.  Patient is agreement. Continue to monitor. 3.  B12 deficiency Last B12 was on 12/22/2019. Continue B12 monthly x 6. Check folate annually. 4.  Pulmonary nodules Low dose chest CT on 03/30/2019 revealed scattered small bilateral pulmonary nodules measuring up to 3.9  mm in the left lower lobe. Review plan for next CT scan on 03/29/2020. 5.  GI issues Patient has a history of rectal bleeding. She currently denies any  bleeding. She has chronic nausea. Rx: ondansetron and Protonix. Encourage follow-up with Dr Bonna Gains (note sent). 6.Weight loss             Etiology unclear.  Patient notes eating well without vomiting or diarrhea.  Check thyroid function studies. 7.   Fatigue  Patient notes poor sleep, waking up every 2 hours and snoring.  Patient may have sleep apnea.  Follow-up with Dr Silvio Pate for possible overnight oximetry. 8.   RTC on 01/26/2020 for labs (CMP, TSH, free T4), B12 injection and Venofer x 1. 9.   RTC next week for MD assessment, review of above labs and evaluation for weight loss.  I discussed the assessment and treatment plan with the patient.  The patient was provided an opportunity to ask questions and all were answered.  The patient agreed with the plan and demonstrated an understanding of the instructions.  The patient was advised to call back if the symptoms worsen or if the condition fails to improve as anticipated.   Lequita Asal, MD, PhD    01/25/2020, 1:53 PM  I, Selena Batten, am acting as scribe for [employer]. Mike Gip, MD, PhD.  I, Nickey Kloepfer C. Mike Gip, MD, have reviewed the above documentation for accuracy and completeness, and I agree with the above.

## 2020-01-24 ENCOUNTER — Inpatient Hospital Stay: Payer: Medicare HMO | Attending: Hematology and Oncology

## 2020-01-24 ENCOUNTER — Other Ambulatory Visit: Payer: Self-pay

## 2020-01-24 DIAGNOSIS — Z885 Allergy status to narcotic agent status: Secondary | ICD-10-CM | POA: Insufficient documentation

## 2020-01-24 DIAGNOSIS — G4700 Insomnia, unspecified: Secondary | ICD-10-CM | POA: Insufficient documentation

## 2020-01-24 DIAGNOSIS — R079 Chest pain, unspecified: Secondary | ICD-10-CM | POA: Insufficient documentation

## 2020-01-24 DIAGNOSIS — K921 Melena: Secondary | ICD-10-CM | POA: Diagnosis not present

## 2020-01-24 DIAGNOSIS — I10 Essential (primary) hypertension: Secondary | ICD-10-CM | POA: Diagnosis not present

## 2020-01-24 DIAGNOSIS — R110 Nausea: Secondary | ICD-10-CM | POA: Insufficient documentation

## 2020-01-24 DIAGNOSIS — Z820 Family history of epilepsy and other diseases of the nervous system: Secondary | ICD-10-CM | POA: Insufficient documentation

## 2020-01-24 DIAGNOSIS — Z8249 Family history of ischemic heart disease and other diseases of the circulatory system: Secondary | ICD-10-CM | POA: Insufficient documentation

## 2020-01-24 DIAGNOSIS — D509 Iron deficiency anemia, unspecified: Secondary | ICD-10-CM | POA: Insufficient documentation

## 2020-01-24 DIAGNOSIS — Z836 Family history of other diseases of the respiratory system: Secondary | ICD-10-CM | POA: Insufficient documentation

## 2020-01-24 DIAGNOSIS — R634 Abnormal weight loss: Secondary | ICD-10-CM | POA: Insufficient documentation

## 2020-01-24 DIAGNOSIS — E538 Deficiency of other specified B group vitamins: Secondary | ICD-10-CM | POA: Insufficient documentation

## 2020-01-24 DIAGNOSIS — R490 Dysphonia: Secondary | ICD-10-CM | POA: Insufficient documentation

## 2020-01-24 DIAGNOSIS — R519 Headache, unspecified: Secondary | ICD-10-CM | POA: Diagnosis not present

## 2020-01-24 DIAGNOSIS — D125 Benign neoplasm of sigmoid colon: Secondary | ICD-10-CM | POA: Insufficient documentation

## 2020-01-24 DIAGNOSIS — R0683 Snoring: Secondary | ICD-10-CM | POA: Diagnosis not present

## 2020-01-24 DIAGNOSIS — N6315 Unspecified lump in the right breast, overlapping quadrants: Secondary | ICD-10-CM | POA: Insufficient documentation

## 2020-01-24 DIAGNOSIS — Z803 Family history of malignant neoplasm of breast: Secondary | ICD-10-CM | POA: Insufficient documentation

## 2020-01-24 DIAGNOSIS — Z8719 Personal history of other diseases of the digestive system: Secondary | ICD-10-CM | POA: Insufficient documentation

## 2020-01-24 DIAGNOSIS — F17210 Nicotine dependence, cigarettes, uncomplicated: Secondary | ICD-10-CM | POA: Diagnosis not present

## 2020-01-24 DIAGNOSIS — Z800 Family history of malignant neoplasm of digestive organs: Secondary | ICD-10-CM | POA: Insufficient documentation

## 2020-01-24 DIAGNOSIS — Z833 Family history of diabetes mellitus: Secondary | ICD-10-CM | POA: Insufficient documentation

## 2020-01-24 DIAGNOSIS — Z818 Family history of other mental and behavioral disorders: Secondary | ICD-10-CM | POA: Insufficient documentation

## 2020-01-24 DIAGNOSIS — Z8261 Family history of arthritis: Secondary | ICD-10-CM | POA: Insufficient documentation

## 2020-01-24 DIAGNOSIS — K5730 Diverticulosis of large intestine without perforation or abscess without bleeding: Secondary | ICD-10-CM | POA: Insufficient documentation

## 2020-01-24 DIAGNOSIS — J449 Chronic obstructive pulmonary disease, unspecified: Secondary | ICD-10-CM | POA: Insufficient documentation

## 2020-01-24 DIAGNOSIS — Z8541 Personal history of malignant neoplasm of cervix uteri: Secondary | ICD-10-CM | POA: Diagnosis not present

## 2020-01-24 DIAGNOSIS — D500 Iron deficiency anemia secondary to blood loss (chronic): Secondary | ICD-10-CM

## 2020-01-24 DIAGNOSIS — Z79899 Other long term (current) drug therapy: Secondary | ICD-10-CM | POA: Diagnosis not present

## 2020-01-24 DIAGNOSIS — M25511 Pain in right shoulder: Secondary | ICD-10-CM | POA: Diagnosis not present

## 2020-01-24 DIAGNOSIS — M542 Cervicalgia: Secondary | ICD-10-CM | POA: Insufficient documentation

## 2020-01-24 DIAGNOSIS — K219 Gastro-esophageal reflux disease without esophagitis: Secondary | ICD-10-CM | POA: Insufficient documentation

## 2020-01-24 LAB — CBC WITH DIFFERENTIAL/PLATELET
Abs Immature Granulocytes: 0.01 10*3/uL (ref 0.00–0.07)
Basophils Absolute: 0.1 10*3/uL (ref 0.0–0.1)
Basophils Relative: 2 %
Eosinophils Absolute: 0.1 10*3/uL (ref 0.0–0.5)
Eosinophils Relative: 2 %
HCT: 42.6 % (ref 36.0–46.0)
Hemoglobin: 15 g/dL (ref 12.0–15.0)
Immature Granulocytes: 0 %
Lymphocytes Relative: 50 %
Lymphs Abs: 3.8 10*3/uL (ref 0.7–4.0)
MCH: 32.6 pg (ref 26.0–34.0)
MCHC: 35.2 g/dL (ref 30.0–36.0)
MCV: 92.6 fL (ref 80.0–100.0)
Monocytes Absolute: 0.6 10*3/uL (ref 0.1–1.0)
Monocytes Relative: 8 %
Neutro Abs: 2.9 10*3/uL (ref 1.7–7.7)
Neutrophils Relative %: 38 %
Platelets: 343 10*3/uL (ref 150–400)
RBC: 4.6 MIL/uL (ref 3.87–5.11)
RDW: 12.7 % (ref 11.5–15.5)
WBC: 7.6 10*3/uL (ref 4.0–10.5)
nRBC: 0 % (ref 0.0–0.2)

## 2020-01-24 LAB — FERRITIN: Ferritin: 15 ng/mL (ref 11–307)

## 2020-01-24 NOTE — Progress Notes (Signed)
Confirmed patient name and DOB. Patient is having nausea and would like refill on Zofran and also Protonix.

## 2020-01-25 ENCOUNTER — Encounter: Payer: Self-pay | Admitting: Hematology and Oncology

## 2020-01-25 ENCOUNTER — Inpatient Hospital Stay (HOSPITAL_BASED_OUTPATIENT_CLINIC_OR_DEPARTMENT_OTHER): Payer: Medicare HMO | Admitting: Hematology and Oncology

## 2020-01-25 DIAGNOSIS — K219 Gastro-esophageal reflux disease without esophagitis: Secondary | ICD-10-CM | POA: Diagnosis not present

## 2020-01-25 DIAGNOSIS — R634 Abnormal weight loss: Secondary | ICD-10-CM

## 2020-01-25 DIAGNOSIS — D500 Iron deficiency anemia secondary to blood loss (chronic): Secondary | ICD-10-CM | POA: Diagnosis not present

## 2020-01-25 DIAGNOSIS — R5383 Other fatigue: Secondary | ICD-10-CM

## 2020-01-25 DIAGNOSIS — E538 Deficiency of other specified B group vitamins: Secondary | ICD-10-CM

## 2020-01-25 DIAGNOSIS — R918 Other nonspecific abnormal finding of lung field: Secondary | ICD-10-CM | POA: Diagnosis not present

## 2020-01-25 MED ORDER — ONDANSETRON 4 MG PO TBDP: 4.0000 mg | ORAL_TABLET | Freq: Three times a day (TID) | ORAL | 0 refills | Status: DC | PRN

## 2020-01-25 MED ORDER — PANTOPRAZOLE SODIUM 20 MG PO TBEC: 20.0000 mg | DELAYED_RELEASE_TABLET | Freq: Every day | ORAL | 1 refills | Status: DC

## 2020-01-26 ENCOUNTER — Other Ambulatory Visit: Payer: Self-pay

## 2020-01-26 ENCOUNTER — Inpatient Hospital Stay: Payer: Medicare HMO

## 2020-01-26 ENCOUNTER — Ambulatory Visit: Payer: Medicare HMO

## 2020-01-26 VITALS — BP 128/74 | HR 69 | Temp 96.7°F | Resp 18

## 2020-01-26 DIAGNOSIS — D125 Benign neoplasm of sigmoid colon: Secondary | ICD-10-CM | POA: Diagnosis not present

## 2020-01-26 DIAGNOSIS — R0683 Snoring: Secondary | ICD-10-CM | POA: Diagnosis not present

## 2020-01-26 DIAGNOSIS — D509 Iron deficiency anemia, unspecified: Secondary | ICD-10-CM | POA: Diagnosis not present

## 2020-01-26 DIAGNOSIS — M25511 Pain in right shoulder: Secondary | ICD-10-CM | POA: Diagnosis not present

## 2020-01-26 DIAGNOSIS — K219 Gastro-esophageal reflux disease without esophagitis: Secondary | ICD-10-CM

## 2020-01-26 DIAGNOSIS — K5730 Diverticulosis of large intestine without perforation or abscess without bleeding: Secondary | ICD-10-CM | POA: Diagnosis not present

## 2020-01-26 DIAGNOSIS — R634 Abnormal weight loss: Secondary | ICD-10-CM | POA: Diagnosis not present

## 2020-01-26 DIAGNOSIS — M542 Cervicalgia: Secondary | ICD-10-CM | POA: Diagnosis not present

## 2020-01-26 DIAGNOSIS — E538 Deficiency of other specified B group vitamins: Secondary | ICD-10-CM | POA: Diagnosis not present

## 2020-01-26 DIAGNOSIS — D500 Iron deficiency anemia secondary to blood loss (chronic): Secondary | ICD-10-CM

## 2020-01-26 DIAGNOSIS — N6315 Unspecified lump in the right breast, overlapping quadrants: Secondary | ICD-10-CM | POA: Diagnosis not present

## 2020-01-26 LAB — T4, FREE: Free T4: 0.67 ng/dL (ref 0.61–1.12)

## 2020-01-26 MED ORDER — IRON SUCROSE 20 MG/ML IV SOLN
200.0000 mg | Freq: Once | INTRAVENOUS | Status: AC
  Administered 2020-01-26: 200 mg via INTRAVENOUS

## 2020-01-26 MED ORDER — SODIUM CHLORIDE 0.9 % IV SOLN
Freq: Once | INTRAVENOUS | Status: AC
  Filled 2020-01-26: qty 250

## 2020-01-26 MED ORDER — CYANOCOBALAMIN 1000 MCG/ML IJ SOLN
1000.0000 ug | Freq: Once | INTRAMUSCULAR | Status: AC
  Administered 2020-01-26: 1000 ug via INTRAMUSCULAR

## 2020-01-26 NOTE — Patient Instructions (Signed)
Iron Sucrose injection What is this medicine? IRON SUCROSE (AHY ern SOO krohs) is an iron complex. Iron is used to make healthy red blood cells, which carry oxygen and nutrients throughout the body. This medicine is used to treat iron deficiency anemia in people with chronic kidney disease. This medicine may be used for other purposes; ask your health care provider or pharmacist if you have questions. COMMON BRAND NAME(S): Venofer What should I tell my health care provider before I take this medicine? They need to know if you have any of these conditions:  anemia not caused by low iron levels  heart disease  high levels of iron in the blood  kidney disease  liver disease  an unusual or allergic reaction to iron, other medicines, foods, dyes, or preservatives  pregnant or trying to get pregnant  breast-feeding How should I use this medicine? This medicine is for infusion into a vein. It is given by a health care professional in a hospital or clinic setting. Talk to your pediatrician regarding the use of this medicine in children. While this drug may be prescribed for children as young as 2 years for selected conditions, precautions do apply. Overdosage: If you think you have taken too much of this medicine contact a poison control center or emergency room at once. NOTE: This medicine is only for you. Do not share this medicine with others. What if I miss a dose? It is important not to miss your dose. Call your doctor or health care professional if you are unable to keep an appointment. What may interact with this medicine? Do not take this medicine with any of the following medications:  deferoxamine  dimercaprol  other iron products This medicine may also interact with the following medications:  chloramphenicol  deferasirox This list may not describe all possible interactions. Give your health care provider a list of all the medicines, herbs, non-prescription drugs, or  dietary supplements you use. Also tell them if you smoke, drink alcohol, or use illegal drugs. Some items may interact with your medicine. What should I watch for while using this medicine? Visit your doctor or healthcare professional regularly. Tell your doctor or healthcare professional if your symptoms do not start to get better or if they get worse. You may need blood work done while you are taking this medicine. You may need to follow a special diet. Talk to your doctor. Foods that contain iron include: whole grains/cereals, dried fruits, beans, or peas, leafy green vegetables, and organ meats (liver, kidney). What side effects may I notice from receiving this medicine? Side effects that you should report to your doctor or health care professional as soon as possible:  allergic reactions like skin rash, itching or hives, swelling of the face, lips, or tongue  breathing problems  changes in blood pressure  cough  fast, irregular heartbeat  feeling faint or lightheaded, falls  fever or chills  flushing, sweating, or hot feelings  joint or muscle aches/pains  seizures  swelling of the ankles or feet  unusually weak or tired Side effects that usually do not require medical attention (report to your doctor or health care professional if they continue or are bothersome):  diarrhea  feeling achy  headache  irritation at site where injected  nausea, vomiting  stomach upset  tiredness This list may not describe all possible side effects. Call your doctor for medical advice about side effects. You may report side effects to FDA at 1-800-FDA-1088. Where should I keep   my medicine? This drug is given in a hospital or clinic and will not be stored at home. NOTE: This sheet is a summary. It may not cover all possible information. If you have questions about this medicine, talk to your doctor, pharmacist, or health care provider.  2020 Elsevier/Gold Standard (2011-08-06  17:14:35)   Cyanocobalamin, Vitamin B12 injection What is this medicine? CYANOCOBALAMIN (sye an oh koe BAL a min) is a man made form of vitamin B12. Vitamin B12 is used in the growth of healthy blood cells, nerve cells, and proteins in the body. It also helps with the metabolism of fats and carbohydrates. This medicine is used to treat people who can not absorb vitamin B12. This medicine may be used for other purposes; ask your health care provider or pharmacist if you have questions. COMMON BRAND NAME(S): B-12 Compliance Kit, B-12 Injection Kit, Cyomin, LA-12, Nutri-Twelve, Physicians EZ Use B-12, Primabalt What should I tell my health care provider before I take this medicine? They need to know if you have any of these conditions:  kidney disease  Leber's disease  megaloblastic anemia  an unusual or allergic reaction to cyanocobalamin, cobalt, other medicines, foods, dyes, or preservatives  pregnant or trying to get pregnant  breast-feeding How should I use this medicine? This medicine is injected into a muscle or deeply under the skin. It is usually given by a health care professional in a clinic or doctor's office. However, your doctor may teach you how to inject yourself. Follow all instructions. Talk to your pediatrician regarding the use of this medicine in children. Special care may be needed. Overdosage: If you think you have taken too much of this medicine contact a poison control center or emergency room at once. NOTE: This medicine is only for you. Do not share this medicine with others. What if I miss a dose? If you are given your dose at a clinic or doctor's office, call to reschedule your appointment. If you give your own injections and you miss a dose, take it as soon as you can. If it is almost time for your next dose, take only that dose. Do not take double or extra doses. What may interact with this medicine?  colchicine  heavy alcohol intake This list may not  describe all possible interactions. Give your health care provider a list of all the medicines, herbs, non-prescription drugs, or dietary supplements you use. Also tell them if you smoke, drink alcohol, or use illegal drugs. Some items may interact with your medicine. What should I watch for while using this medicine? Visit your doctor or health care professional regularly. You may need blood work done while you are taking this medicine. You may need to follow a special diet. Talk to your doctor. Limit your alcohol intake and avoid smoking to get the best benefit. What side effects may I notice from receiving this medicine? Side effects that you should report to your doctor or health care professional as soon as possible:  allergic reactions like skin rash, itching or hives, swelling of the face, lips, or tongue  blue tint to skin  chest tightness, pain  difficulty breathing, wheezing  dizziness  red, swollen painful area on the leg Side effects that usually do not require medical attention (report to your doctor or health care professional if they continue or are bothersome):  diarrhea  headache This list may not describe all possible side effects. Call your doctor for medical advice about side effects. You may report   side effects to FDA at 1-800-FDA-1088. Where should I keep my medicine? Keep out of the reach of children. Store at room temperature between 15 and 30 degrees C (59 and 85 degrees F). Protect from light. Throw away any unused medicine after the expiration date. NOTE: This sheet is a summary. It may not cover all possible information. If you have questions about this medicine, talk to your doctor, pharmacist, or health care provider.  2020 Elsevier/Gold Standard (2008-02-06 22:10:20)  

## 2020-01-27 NOTE — Progress Notes (Signed)
[facility]  [address], [address] [place], [place] [address] Phone: [phone]  Fax: [phone]   Clinic Day:  01/29/2020  Referring physician: Venia Carbon, MD  Chief Complaint: Molly Peters is a 59 y.o. female with iron deficiency anemia, B12 deficiency, and unexplained weight loss who is seen for a 1 week assessment.   HPI: The patient was last seen in the hematology clinic on 01/25/2020 via telemedicine.  At that time, she felt fatigued.  She described snoring and waking up every 2 hours.  She denied any bleeding.  She was smoking > 1/2 pack per day.  Concern was raised for possible sleep apnea.  She was eating well, but losing weight.  Weight was down to 95 pounds.  Hematocrit was 42.6, hemoglobin 15.0, MCV 92.6, platelets 343,000, WBC 7,600.  Ferritin was 15.   Patient received Venofer x 1 and B12 on 01/26/2020.   Labs on 01/26/2020 showed a free T4 of 0.67.   During the interim, she has continued to feel  "sleepy and tired".  She notes having no energy for a few months. Her weight continues to trend downward. She does not eat breakfast.  For lunch, she eats a sandwich.  For supper, she eats a large meal. She is eating plenty of meat and potatoes. She tends to snack all throughout the night. She denies any vomiting. She has diarrhea and constipation on and off. Her stool is green in color with some blood and mucus for the past month. She reports the blood in her stool is very minimal. She notes feeling nauseated.   Patient agreed to speak with a nutritionist. Patient declined Ensure and Boost due to the bad taste.  Patient is followed by Dr. Bonna Gains in GI.  She notes some headaches. She notes there are a lot of broken blood vessels behind her right eye. Her eye doctor thought it could be early glaucoma and gave her eye drops that must be refrigerated.   Her shortness of breath is about the same. She continues to smoke half a pack a day. She notes  constant joint and bone pain s/p right shoulder surgery. Her knees and hands are always tender and swollen. She notes right sided neck pain. She continues to have insomnia and wakes every two hours.  Her voice has been hoarse for 2 weeks now.    Past Medical History:  Diagnosis Date  . Anxiety   . Cervical cancer ([place]) 1980's  . Chronic back pain 11/12/2009   Qualifier: Diagnosis of  By: Maxie Better FNP, Rosalita Levan   . COPD (chronic obstructive pulmonary disease) (HCC)    states SOB with ADLs; no O2 use; able to speak in complete sentences without SOB(11/15/2014)  . Depression   . Difficulty swallowing pills    s/p cervical fusion  . Family history of adverse reaction to anesthesia    pt's mother and sister have hx. of post-op N/V  . Fibromyalgia   . GERD (gastroesophageal reflux disease)   . Heart murmur   . History of gastric ulcer   . Hypertension    states under control with med., has been on med. x 1-2 yr.  . IBS (irritable bowel syndrome)   . Internal hemorrhoid    states has had intermittent bright red bleeding with BM (11/15/2014)  . Intraductal papilloma of breast, right 08/08/2018  . Limited joint range of motion    neck - s/p cervical fusion  . Localized primary osteoarthritis of  right shoulder region 11/23/2014  . Osteoarthritis of left shoulder 07/05/2015  . Osteoarthritis of right shoulder 11/2014  . Pneumonia   . Seizures ([place])    last seizure 2010  . Short-term memory loss   . TMJ syndrome   . Valvular heart disease    Initial workup a number of years ago at [facility].  Echo (10/2009) showed EF 60-65%,      normal LV size, moderate aortic insufficiency with a trileaflet aortic valve and normal aortic root size.  Mild      mitral regurgitation.   . Wears dentures    lower    Past Surgical History:  Procedure Laterality Date  . ABDOMINAL HYSTERECTOMY     partial  . ANTERIOR CERVICAL DECOMP/DISCECTOMY FUSION  02/06/2011   C4-5, C5-6, C6-7  . BREAST BIOPSY Bilateral 10+  yrs ago   neg  . BREAST BIOPSY Right 08/03/2018   2 areas bx, -neg  . BREAST LUMPECTOMY Bilateral 1989   x 3 - benign  . BREAST LUMPECTOMY Right 08/24/2018   Procedure: BREAST LUMPECTOMY WITH ULTRASOUND IN OR;  Surgeon: Vickie Epley, MD;  Location: [facility] ORS;  Service: General;  Laterality: Right;  . CARDIAC CATHETERIZATION  1995  . CESAREAN SECTION     x 2  . COLONOSCOPY  09/28/2008  . COLONOSCOPY N/A 06/15/2018   Procedure: COLONOSCOPY;  Surgeon: Virgel Manifold, MD;  Location: [facility] ENDOSCOPY;  Service: Endoscopy;  Laterality: N/A;  . COLONOSCOPY WITH PROPOFOL N/A 05/04/2019   Procedure: COLONOSCOPY WITH PROPOFOL;  Surgeon: Virgel Manifold, MD;  Location: [facility] ENDOSCOPY;  Service: Endoscopy;  Laterality: N/A;  . ESOPHAGOGASTRODUODENOSCOPY  09/28/2008  . ESOPHAGOGASTRODUODENOSCOPY N/A 06/14/2018   Procedure: ESOPHAGOGASTRODUODENOSCOPY (EGD);  Surgeon: Virgel Manifold, MD;  Location: [facility] END[facility]COPY;  Service: Endoscopy;  Laterality: N/A;  . ESOPHAGOGASTRODUODENOSCOPY (EGD) WITH PROPOFOL N/A 05/04/2019   Procedure: ESOPHAGOGASTRODUODENOSCOPY (EGD) WITH PROPOFOL;  Surgeon: Virgel Manifold, MD;  Location: [facility] ENDOSCOPY;  Service: Endoscopy;  Laterality: N/A;  . HARDWARE REMOVAL  11/17/2006   L5-S1  . LAMINECTOMY WITH POSTERIOR LATERAL ARTHRODESIS LEVEL 3  12/06/2009   with synovial cyst resection L3-4 bilat.  Marland Kitchen LAMINECTOMY WITH POSTERIOR LATERAL ARTHRODESIS LEVEL 3  11/17/2006   L4-5  . NM MYOVIEW LTD     Lexiscan myoview (10/2009): EF 77%, normal wall motion, normal perfusion.   Marland Kitchen SHOULDER ARTHROSCOPY WITH DEBRIDEMENT AND BICEP TENDON REPAIR Right 04/13/2014   Procedure: RIGHT SHOULDER ARTHROSCOPY WITH DEBRIDEMENT EXTENSIVE;  Surgeon: Johnny Bridge, MD;  Location: [facility];  Service: Orthopedics;  Laterality: Right;  . TOTAL SHOULDER ARTHROPLASTY Right 11/23/2014   Procedure: TOTAL RIGHT SHOULDER ARTHROPLASTY;  Surgeon: Johnny Bridge, MD;  Location: [facility];  Service: Orthopedics;  Laterality: Right;  . TOTAL SHOULDER ARTHROPLASTY Left 07/05/2015   Procedure: LEFT TOTAL SHOULDER REPLACEMENT;  Surgeon: Marchia Bond, MD;  Location: [facility];  Service: Orthopedics;  Laterality: Left;    Family History  Problem Relation Age of Onset  . Cervical cancer Mother   . Anesthesia problems Mother        post-op N/V  . Rheum arthritis Mother   . Anxiety disorder Mother   . Cancer Father        colon cancer  . Migraines Sister   . Cervical cancer Sister   . Anesthesia problems Sister        post-op N/V  . Migraines Brother   . Asthma Daughter   . Cerebral  palsy Daughter        age 33  . Coronary artery disease Maternal Grandmother   . Hypertension Maternal Grandmother   . Diabetes Maternal Grandmother   . Coronary artery disease Paternal Grandmother   . Hypertension Paternal Grandmother   . Diabetes Paternal Grandmother   . Breast cancer Maternal Aunt   . Breast cancer Maternal Aunt   . Breast cancer Maternal Aunt     Social History:  reports that she has been smoking cigarettes. She has a 42.75 pack-year smoking history. She has never used smokeless tobacco. She reports current alcohol use. She reports current drug use. Drug: Marijuana. Patient smoked 1.5 packs of cigarettes per day for 40+ years. She is currently smoking half a pack a day. She drinks "a hair" every now and then. Patient has been on disability in 2005 for "ruptured back". She is a former [employer]. The patient is alone today.  Allergies:  Allergies  Allergen Reactions  . Carbamazepine Hives  . Divalproex Sodium Swelling and Other (See Comments)    HAIR FALLS OUT  . Clonazepam Other (See Comments)    HALLUCINATIONS  . Diphenhydramine Hcl Rash  . Tiotropium Bromide Monohydrate Other (See Comments)    CREATES YEAST IN THROAT  . Vicodin [Hydrocodone-Acetaminophen] Itching    Current Medications: Current Outpatient Medications  Medication  Sig Dispense Refill  . ALPRAZolam (XANAX) 1 MG tablet TAKE 1/2 TO 1 TABLET BY MOUTH 3 TIMES DAILY AS NEEDED ANXIETY 90 tablet 0  . lamoTRIgine (LAMICTAL) 100 MG tablet TAKE 1 TABLET BY MOUTH TWICE DAILY 180 tablet 3  . latanoprost (XALATAN) 0.005 % ophthalmic solution     . losartan-hydrochlorothiazide (HYZAAR) 100-12.5 MG tablet Take 1 tablet by mouth daily. 90 tablet 3  . pantoprazole (PROTONIX) 20 MG tablet Take 1 tablet (20 mg total) by mouth daily. 30 tablet 1  . PARoxetine (PAXIL) 20 MG tablet TAKE 1 TABLET BY MOUTH ONCE DAILY 90 tablet 3  . albuterol (VENTOLIN HFA) 108 (90 Base) MCG/ACT inhaler Inhale 2 puffs into the lungs every 6 (six) hours as needed for wheezing or shortness of breath.    . ondansetron (ZOFRAN ODT) 4 MG disintegrating tablet Take 1 tablet (4 mg total) by mouth every 8 (eight) hours as needed for nausea or vomiting. (Patient not taking: Reported on 01/29/2020) 20 tablet 0   No current facility-administered medications for this visit.    Review of Systems  Constitutional: Positive for malaise/fatigue (no energy) and weight loss (1 lb since 08/30/2019; unitentional weight loss; baseline 105-108 ;bs). Negative for chills, diaphoresis and fever.       Feels "sleepy and tired".  HENT: Negative for congestion, ear discharge, ear pain, hearing loss, nosebleeds and sinus pain.        Hoarse voice x 2 weeks.  Eyes: Negative for blurred vision.       Early glaucoma on eye drops.  Respiratory: Positive for shortness of breath (stable). Negative for cough, hemoptysis, sputum production and stridor.   Cardiovascular: Negative for chest pain, palpitations and leg swelling.  Gastrointestinal: Positive for blood in stool (minimal), constipation (on and off), diarrhea (on and off; green color) and nausea. Negative for abdominal pain, heartburn, melena and vomiting.       No breakfast; sandwich at lunch; dinner is a larger meal. Snacking at night. Mucus in stool.  Genitourinary:  Negative.  Negative for dysuria, frequency, hematuria and urgency.  Musculoskeletal: Positive for joint pain (right shoulder; knees and hands) and neck pain (  right side; pain increases with movement). Negative for back pain and myalgias.  Skin: Negative for itching and rash.  Neurological: Positive for headaches. Negative for dizziness, tingling, tremors, sensory change, focal weakness and weakness.       Change in thinking from feeling tired all the time.  Endo/Heme/Allergies: Negative.  Does not bruise/bleed easily.  Psychiatric/Behavioral: Negative for depression and memory loss. The patient has insomnia (waking up every 2 hours at night). The patient is not nervous/anxious.   All other systems reviewed and are negative.  Performance status (ECOG): 1-2  Vitals Blood pressure 135/78, pulse 70, temperature 97.8 F (36.6 C), temperature source Tympanic, resp. rate 18, height 5' (1.524 m), weight 98 lb 12.3 oz (44.8 kg), SpO2 99 %.   Physical Exam  Constitutional: She is oriented to person, place, and time.  Thin woman sitting comfortably in the exam room in no acute distress.  HENT:  Head: Atraumatic.  Mouth/Throat: Oropharynx is clear and moist. No oropharyngeal exudate.  Long gray hair.  Mask.  Eyes: Pupils are equal, round, and reactive to light. Conjunctivae and EOM are normal. No scleral icterus.  Glasses.  Blue eyes.  Neck: No JVD present.  Cardiovascular: Normal rate, regular rhythm and normal heart sounds.  No murmur heard. Pulmonary/Chest: Effort normal and breath sounds normal. No respiratory distress. She has no wheezes. She has no rales. She exhibits no tenderness.  Abdominal: Soft. Bowel sounds are normal. She exhibits no distension and no mass. There is abdominal tenderness (upper abdomen). There is no rebound and no guarding.  Musculoskeletal:        General: Tenderness (neck; right side > left side) present. No edema. Normal range of motion.     Cervical back: Normal  range of motion and neck supple.     Comments: Tight and neck muscles.  Lymphadenopathy:       Head (right side): No preauricular, no posterior auricular and no occipital adenopathy present.       Head (left side): No preauricular, no posterior auricular and no occipital adenopathy present.    She has no cervical adenopathy.    She has no axillary adenopathy.       Right: No inguinal and no supraclavicular adenopathy present.       Left: No inguinal and no supraclavicular adenopathy present.  Neurological: She is alert and oriented to person, place, and time.  Skin: Skin is warm and dry. She is not diaphoretic.  Psychiatric: She has a normal mood and affect. Her behavior is normal. Judgment and thought content normal.  Nursing note and vitals reviewed.   No visits with results within 3 Day(s) from this visit.  Latest known visit with results is:  Appointment on 01/26/2020  Component Date Value Ref Range Status  . Free T4 01/26/2020 0.67  0.61 - 1.12 ng/dL Final   Comment: (NOTE) Biotin ingestion may interfere with free T4 tests. If the results are inconsistent with the TSH level, previous test results, or the clinical presentation, then consider biotin interference. If needed, order repeat testing after stopping biotin. Performed at [facility], [address] [place], [place] [address]     Assessment:  Molly Peters is a 59 y.o. female withiron deficiency anemiasecondary to an upper GI bleed. She presented with intermittent melena and chest pain. Hemoglobin nadir was 6.9 on 06/14/2018.Ferritin was 9, iron saturation 11% and TIBC 393 on 06/20/2018. She received 1 unit of PRBCs.  Work-up on 08/27/2019revealed a hematocrit of 31, hemoglobin  10.2, and MCV 70.8. Ferritin was 7. B12 was 165 (low). Folate was 6.3 (>5.9). TSH was 1.256.  Ferritinhas been followed: 9 on 06/20/2018, 7 on 07/05/2018, 5 on 07/19/2018, 81 on 09/12/2018, 12 on 10/24/2018, 71 on  02/07/2019, 51 on 05/09/2019, 32 on 07/28/2019, 29 on 08/28/2019, and 15 on 01/24/2020.   She received Venofer200 mg on 07/25/2018, weekly x 3 (08/15/2018 - 08/29/2018), weekly x 2 (01/10/2019 - 01/17/2019), and 01/26/2020.  She has B12 deficiency. She began oral B12on 07/06/2018. B12 was 165 on 07/05/2018, 396 on 07/19/2018, 421 (after B12 injection) on 09/12/2018, and 246 on 10/24/2018 (on oral B12). Folatewas 6.3 on 07/05/2018 and 9.2 on 09/12/2018. She began monthly B12on 08/29/2018 (last03/19/2021).  EGDon 06/14/2018 revealed esophageal mucosal changes c/w short segment of Barrett's and a non-bleeding gastric ulcers. Pathology at the GE junction revealed squamocolumnar mucosa with moderate chronic inflammation. Gastric biopsy revealed no H pylori, dysplasia, or malignancy. EGD on 05/04/2019 revealed non-bleeding gastric ulcer with a clean ulcer base ([place] Class III) and friable gastric mucosa.  Biopsies revealed antral mild reactive gastropathy with no H pylori, metaplasia, dysplasia or malignancy.  Colonoscopyon 06/15/2018 revealed a 5 mm polyp in the sigmoid colon (tubular adenoma). Colonoscopy on 05/04/2019 revealed three 4 - 5 mm polyps in the sigmoid colon (hyperplastic polyps) and diverticulosis in the sigmoid colon.   Chest CT angiogramon 06/22/2018 reveled no pulmonary embolism. There was underlying emphysematous changes. There were small LUL pulmonary nodular changes (largest 3 mm). There was no thoracic adenopathy.   CT angiogramof the abdomen and pelvis on 08/08/2018 revealed no evidence of abdominal aortic aneurysm or dissection. No findings to suggest active GI bleeding on CT. There was non-vascular wall thickening involving the transverse colon extending to the splenic flexure, suspicious for infectious/inflammatory colitis. There was associated trace pelvic ascites. There was no pneumatosis or free air. C diff and GI panel by PCR was negative. She was  treated with Flagyl and Augmentin.  BILATERAL mammogramon 07/27/2018 revealed 2 indeterminate masses in the RIGHT breast at the 2:30 and 3 o'clock positions. Follow up breast ultrasoundsshows a 6 x 4 x 5 mm oval hypoechoic mass, 1 cm from the nipple, in the 2:30 position. In the 3 o'clock position, again 1 cm from the nipple, the was a 6 x 3 x 3 mm oval hypoechoic mass. LEFT breast ultrasound showed normal fibroglandular tissue at the 10 o'clock position. Ultrasound guided needle biopsy with clip placement on 08/03/2018. Pathology revealed revealed a dilated duct/cyst with sclerosis of the wall in the 2:30 lesion. Sample negative for atypia and malignancy. Lesion in the 3 o'clock position revealed an intraductal papilloma. Sample was negative for atypia and malignancy.  Right breast lumpectomy on 08/24/2018 revealed an intraductal papillomawith adjacent biopsy change. There was sclerotic changes suggestive of cyst/duct wall with adjacent biopsy change. There was duct ectasia and cysts in the tissue between the 2 biopsy sites. There was no atypia or malignancy.  Screening bilateral mammogramon 08/16/2019 revealed no evidence of malignancy.  Symptomatically, she remains fatigued.  She may have sleep apnea.  She smokes 1/2 pack/day.  She has chronic nausea.  She notes minimal blood in her stool.  She is losing weight despite eating well.  Weight is 98 pounds.  Exam reveals mild tenderness in the upper abdomen.  Plan: 1.   Review labs from 01/24/2020 and 01/26/2020. 2.Weight loss  Current weight 98 pounds.  Patient's baseline weight is 105-108 pounds.  Patient notes eating well without vomiting or significant diarrhea (  off and on).  Thyroid function normal.  Patient smokes.     Low dose chest CT 03/30/2019 revealed scattered small LLL nodules.  CT angiogramof the abdomen and pelvis on 08/08/2018 revealed no masses.  Discuss nutrition consult.  Discuss imaging to r/o malignancy. 3.    Iron deficiency anemia Hematocrit 42.6.Hemoglobin 15.0.MCV 92.6 on 01/24/2020.             Ferritin15. Her diet appears good. She notes a little bit of rectal bleeding. She received Venofer x 1 on 01/26/2020. Continue to monitor. 4.B12 deficiency Last B12 was on 01/26/2020. Continue B12 monthly x 6. Check folate annually. 5.Pulmonary nodules Low dose chest CT on 03/30/2019 revealedscattered small (up to 3.9 mm) bilateral pulmonary nodules in the left lower lobe. Low dose chest CT scheduled annually. 6. GI issues Patient notes minimal rectal bleeding. She has chronic nausea. She is on Protonix and ondansetron.  RN to call [facility] re: ondansetron Rx. She has off and on diarrhea and constipation. Follow-up with Dr Bonna Gains.  Appointment to be made at [facility] today. 8.   Nutrition consult. 9.   PET scan. 10.   RTC after PET scan for MD assessment and discussion regarding direction of therapy.  I discussed the assessment and treatment plan with the patient.  The patient was provided an opportunity to ask questions and all were answered.  The patient agreed with the plan and demonstrated an understanding of the instructions.  The patient was advised to call back if the symptoms worsen or if the condition fails to improve as anticipated.  I provided 21 minutes of face-to-face time during this this encounter and > 50% was spent counseling as documented under my assessment and plan.    Lequita Asal, MD, PhD    01/29/2020, 8:59 AM  I, Selena Batten, am acting as scribe for [employer]. Mike Gip, MD, PhD.  I, Lucian Baswell C. Mike Gip, MD, have reviewed the above documentation for accuracy and completeness, and I agree with the above.

## 2020-01-29 ENCOUNTER — Other Ambulatory Visit: Payer: Self-pay

## 2020-01-29 ENCOUNTER — Telehealth: Payer: Self-pay | Admitting: Gastroenterology

## 2020-01-29 ENCOUNTER — Inpatient Hospital Stay (HOSPITAL_BASED_OUTPATIENT_CLINIC_OR_DEPARTMENT_OTHER): Payer: Medicare HMO | Admitting: Hematology and Oncology

## 2020-01-29 ENCOUNTER — Encounter: Payer: Self-pay | Admitting: Hematology and Oncology

## 2020-01-29 VITALS — BP 135/78 | HR 70 | Temp 97.8°F | Resp 18 | Ht 60.0 in | Wt 98.8 lb

## 2020-01-29 DIAGNOSIS — D500 Iron deficiency anemia secondary to blood loss (chronic): Secondary | ICD-10-CM

## 2020-01-29 DIAGNOSIS — R634 Abnormal weight loss: Secondary | ICD-10-CM | POA: Diagnosis not present

## 2020-01-29 DIAGNOSIS — E538 Deficiency of other specified B group vitamins: Secondary | ICD-10-CM

## 2020-01-29 DIAGNOSIS — M25511 Pain in right shoulder: Secondary | ICD-10-CM | POA: Diagnosis not present

## 2020-01-29 DIAGNOSIS — R112 Nausea with vomiting, unspecified: Secondary | ICD-10-CM

## 2020-01-29 DIAGNOSIS — M542 Cervicalgia: Secondary | ICD-10-CM | POA: Diagnosis not present

## 2020-01-29 DIAGNOSIS — K5730 Diverticulosis of large intestine without perforation or abscess without bleeding: Secondary | ICD-10-CM | POA: Diagnosis not present

## 2020-01-29 DIAGNOSIS — R0683 Snoring: Secondary | ICD-10-CM | POA: Diagnosis not present

## 2020-01-29 DIAGNOSIS — D509 Iron deficiency anemia, unspecified: Secondary | ICD-10-CM | POA: Diagnosis not present

## 2020-01-29 DIAGNOSIS — K219 Gastro-esophageal reflux disease without esophagitis: Secondary | ICD-10-CM

## 2020-01-29 DIAGNOSIS — N6315 Unspecified lump in the right breast, overlapping quadrants: Secondary | ICD-10-CM | POA: Diagnosis not present

## 2020-01-29 DIAGNOSIS — D125 Benign neoplasm of sigmoid colon: Secondary | ICD-10-CM | POA: Diagnosis not present

## 2020-01-29 MED ORDER — ONDANSETRON 4 MG PO TBDP: 4.0000 mg | ORAL_TABLET | Freq: Three times a day (TID) | ORAL | 0 refills | Status: DC | PRN

## 2020-01-29 NOTE — Progress Notes (Signed)
No new changes noted today 

## 2020-01-29 NOTE — Telephone Encounter (Signed)
Unable to leave message to offer apt per  Molly Peters from [facility] for [facility] and [facility]

## 2020-01-30 ENCOUNTER — Encounter: Payer: Self-pay | Admitting: Gastroenterology

## 2020-01-30 ENCOUNTER — Ambulatory Visit: Payer: Medicare HMO | Admitting: Gastroenterology

## 2020-01-30 DIAGNOSIS — R5383 Other fatigue: Secondary | ICD-10-CM | POA: Insufficient documentation

## 2020-01-30 DIAGNOSIS — Z8719 Personal history of other diseases of the digestive system: Secondary | ICD-10-CM

## 2020-02-01 ENCOUNTER — Other Ambulatory Visit: Payer: Self-pay | Admitting: Hematology and Oncology

## 2020-02-01 DIAGNOSIS — R634 Abnormal weight loss: Secondary | ICD-10-CM

## 2020-02-05 ENCOUNTER — Other Ambulatory Visit: Payer: Self-pay | Admitting: Internal Medicine

## 2020-02-06 NOTE — Telephone Encounter (Signed)
Last filled 12-21-19 #90 Last OV 06-20-19 Next OV 06-25-20 [facility]

## 2020-02-19 ENCOUNTER — Encounter: Payer: Self-pay | Admitting: Hematology and Oncology

## 2020-02-19 ENCOUNTER — Other Ambulatory Visit: Payer: Self-pay

## 2020-02-19 ENCOUNTER — Ambulatory Visit
Admission: RE | Admit: 2020-02-19 | Discharge: 2020-02-19 | Disposition: A | Discharge status: Home / Self Care | Payer: Medicare HMO | Source: Ambulatory Visit | Attending: Hematology and Oncology | Admitting: Hematology and Oncology

## 2020-02-19 ENCOUNTER — Other Ambulatory Visit
Admission: RE | Admit: 2020-02-19 | Discharge: 2020-02-19 | Disposition: A | Discharge status: Home / Self Care | Payer: Medicare HMO | Source: Ambulatory Visit | Attending: Hematology and Oncology | Admitting: Hematology and Oncology

## 2020-02-19 DIAGNOSIS — K5730 Diverticulosis of large intestine without perforation or abscess without bleeding: Secondary | ICD-10-CM | POA: Diagnosis not present

## 2020-02-19 DIAGNOSIS — R918 Other nonspecific abnormal finding of lung field: Secondary | ICD-10-CM | POA: Insufficient documentation

## 2020-02-19 DIAGNOSIS — I2510 Atherosclerotic heart disease of native coronary artery without angina pectoris: Secondary | ICD-10-CM | POA: Insufficient documentation

## 2020-02-19 DIAGNOSIS — I700 Atherosclerosis of aorta: Secondary | ICD-10-CM | POA: Diagnosis not present

## 2020-02-19 DIAGNOSIS — R634 Abnormal weight loss: Secondary | ICD-10-CM

## 2020-02-19 DIAGNOSIS — J439 Emphysema, unspecified: Secondary | ICD-10-CM | POA: Diagnosis not present

## 2020-02-19 LAB — COMPREHENSIVE METABOLIC PANEL
ALT: 10 U/L (ref 0–44)
AST: 25 U/L (ref 15–41)
Albumin: 4.1 g/dL (ref 3.5–5.0)
Alkaline Phosphatase: 53 U/L (ref 38–126)
Anion gap: 11 (ref 5–15)
BUN: 16 mg/dL (ref 6–20)
CO2: 24 mmol/L (ref 22–32)
Calcium: 8.9 mg/dL (ref 8.9–10.3)
Chloride: 103 mmol/L (ref 98–111)
Creatinine, Ser: 1 mg/dL (ref 0.44–1.00)
GFR calc Af Amer: >60 mL/min (ref >60)
GFR calc non Af Amer: >60 mL/min (ref >60)
Glucose, Bld: 102 mg/dL — ABNORMAL HIGH (ref 70–99)
Potassium: 3.3 mmol/L — ABNORMAL LOW (ref 3.5–5.1)
Sodium: 138 mmol/L (ref 135–145)
Total Bilirubin: 0.5 mg/dL (ref 0.3–1.2)
Total Protein: 7 g/dL (ref 6.5–8.1)

## 2020-02-19 MED ORDER — IOHEXOL 300 MG/ML  SOLN
75.0000 mL | Freq: Once | INTRAMUSCULAR | Status: AC | PRN
  Administered 2020-02-19: 75 mL via INTRAVENOUS

## 2020-02-19 NOTE — Progress Notes (Signed)
The patient c/o increase nausea at times but better with zofran. The patient Name and DOB has been verified by phone today.

## 2020-02-19 NOTE — Progress Notes (Signed)
[facility]  [address] [place], [place] [address] Phone: [phone]  Fax: [phone]   Clinic Day:  02/20/2020  Referring physician: Venia Carbon, MD  Chief Complaint: Molly Peters is a 59 y.o. female with iron deficiency anemia, B12 deficiency, and unexplained weight loss who is seen for a 3 week assessment, review of imaging and discussion regarding direction of therapy.   HPI: The patient was last seen in the hematology clinic on 01/29/2020. At that time, she remained fatigued.  She was felt to possibly have sleep apnea. She continued to smoke 1/2 pack/day. She had chronic nausea. She noted minimal blood in her stool. She was losing weight despite eating well. Weight was 98 pounds. Exam revealed mild tenderness in the upper abdomen.  Nutrition consult was ordered.  PET scan was ordered, but denied by insurance.  Chest, abdomen and pelvis CT on 04/12/02021 revealed no evidence of malignancy within the chest, abdomen or pelvis to account for patient's weight loss.  There were no acute findings. There was emphysematous disease. There were a few tiny bilateral pulmonary nodules none greater than 3 mm in size without significant change from the prior exam.  Annual low-dose CT lung cancer screening was recommended. There was mild colonic diverticulosis. There was mild uniform thickening of the gastric antrum which may be secondary to peristalsis/spasm, although gastritis/antritis was possible.  During the interim, she has felt "tired". Patient has increased nausea that improves with Zofran. Patient has lost 4 more pounds and is currently 94 pounds. She reports that she is eating. She does not eat breakfast. For lunch, she normally eats a sandwich. She eats dinner. She is snacking throughout the day. She missed the previous nutrition consult and she will speak with Jennet Maduro, RD on 02/22/2020.  She has black stool with mucus. She has had one  episode of blood in her stool when she wiped. She notes abdominal pain and bloating. She is gassy some days. She has no energy and is sleeping/napping more than usual. She reports sleeping in increments of 2 hours.  Her voice is still hoarse. She has post nasal drip associated with sinus issues. Her shortness of breath at rest is worse. She has had a few headaches. She continues to have a hoarse voice, neck pain, and joint pain. Patient notes an upcoming appointment in GI with Dr. Marius Ditch.   Past Medical History:  Diagnosis Date  . Anxiety   . Cervical cancer ([place]) 1980's  . Chronic back pain 11/12/2009   Qualifier: Diagnosis of  By: Maxie Better FNP, Rosalita Levan   . COPD (chronic obstructive pulmonary disease) (HCC)    states SOB with ADLs; no O2 use; able to speak in complete sentences without SOB(11/15/2014)  . Depression   . Difficulty swallowing pills    s/p cervical fusion  . Family history of adverse reaction to anesthesia    pt's mother and sister have hx. of post-op N/V  . Fibromyalgia   . GERD (gastroesophageal reflux disease)   . Heart murmur   . History of gastric ulcer   . Hypertension    states under control with med., has been on med. x 1-2 yr.  . IBS (irritable bowel syndrome)   . Internal hemorrhoid    states has had intermittent bright red bleeding with BM (11/15/2014)  . Intraductal papilloma of breast, right 08/08/2018  . Limited joint range of motion    neck - s/p cervical fusion  . Localized primary  osteoarthritis of right shoulder region 11/23/2014  . Osteoarthritis of left shoulder 07/05/2015  . Osteoarthritis of right shoulder 11/2014  . Pneumonia   . Seizures ([place])    last seizure 2010  . Short-term memory loss   . TMJ syndrome   . Valvular heart disease    Initial workup a number of years ago at [facility].  Echo (10/2009) showed EF 60-65%,      normal LV size, moderate aortic insufficiency with a trileaflet aortic valve and normal aortic root size.  Mild      mitral  regurgitation.   . Wears dentures    lower    Past Surgical History:  Procedure Laterality Date  . ABDOMINAL HYSTERECTOMY     partial  . ANTERIOR CERVICAL DECOMP/DISCECTOMY FUSION  02/06/2011   C4-5, C5-6, C6-7  . BREAST BIOPSY Bilateral 10+ yrs ago   neg  . BREAST BIOPSY Right 08/03/2018   2 areas bx, -neg  . BREAST LUMPECTOMY Bilateral 1989   x 3 - benign  . BREAST LUMPECTOMY Right 08/24/2018   Procedure: BREAST LUMPECTOMY WITH ULTRASOUND IN OR;  Surgeon: Vickie Epley, MD;  Location: [facility] ORS;  Service: General;  Laterality: Right;  . CARDIAC CATHETERIZATION  1995  . CESAREAN SECTION     x 2  . COLONOSCOPY  09/28/2008  . COLONOSCOPY N/A 06/15/2018   Procedure: COLONOSCOPY;  Surgeon: Virgel Manifold, MD;  Location: [facility] ENDOSCOPY;  Service: Endoscopy;  Laterality: N/A;  . COLONOSCOPY WITH PROPOFOL N/A 05/04/2019   Procedure: COLONOSCOPY WITH PROPOFOL;  Surgeon: Virgel Manifold, MD;  Location: [facility] ENDOSCOPY;  Service: Endoscopy;  Laterality: N/A;  . ESOPHAGOGASTRODUODENOSCOPY  09/28/2008  . ESOPHAGOGASTRODUODENOSCOPY N/A 06/14/2018   Procedure: ESOPHAGOGASTRODUODENOSCOPY (EGD);  Surgeon: Virgel Manifold, MD;  Location: [facility] ENDOSCOPY;  Service: Endoscopy;  Laterality: N/A;  . ESOPHAGOGASTRODUODENOSCOPY (EGD) WITH PROPOFOL N/A 05/04/2019   Procedure: ESOPHAGOGASTRODUODENOSCOPY (EGD) WITH PROPOFOL;  Surgeon: Virgel Manifold, MD;  Location: [facility] ENDOSCOPY;  Service: Endoscopy;  Laterality: N/A;  . HARDWARE REMOVAL  11/17/2006   L5-S1  . LAMINECTOMY WITH POSTERIOR LATERAL ARTHRODESIS LEVEL 3  12/06/2009   with synovial cyst resection L3-4 bilat.  Marland Kitchen LAMINECTOMY WITH POSTERIOR LATERAL ARTHRODESIS LEVEL 3  11/17/2006   L4-5  . NM MYOVIEW LTD     Lexiscan myoview (10/2009): EF 77%, normal wall motion, normal perfusion.   Marland Kitchen SHOULDER ARTHROSCOPY WITH DEBRIDEMENT AND BICEP TENDON REPAIR Right 04/13/2014   Procedure: RIGHT SHOULDER ARTHROSCOPY WITH DEBRIDEMENT EXTENSIVE;   Surgeon: Johnny Bridge, MD;  Location: [place];  Service: Orthopedics;  Laterality: Right;  . TOTAL SHOULDER ARTHROPLASTY Right 11/23/2014   Procedure: TOTAL RIGHT SHOULDER ARTHROPLASTY;  Surgeon: Johnny Bridge, MD;  Location: [place];  Service: Orthopedics;  Laterality: Right;  . TOTAL SHOULDER ARTHROPLASTY Left 07/05/2015   Procedure: LEFT TOTAL SHOULDER REPLACEMENT;  Surgeon: Marchia Bond, MD;  Location: [facility];  Service: Orthopedics;  Laterality: Left;    Family History  Problem Relation Age of Onset  . Cervical cancer Mother   . Anesthesia problems Mother        post-op N/V  . Rheum arthritis Mother   . Anxiety disorder Mother   . Cancer Father        colon cancer  . Migraines Sister   . Cervical cancer Sister   . Anesthesia problems Sister        post-op N/V  . Migraines Brother   . Asthma Daughter   .  Cerebral palsy Daughter        age 36  . Coronary artery disease Maternal Grandmother   . Hypertension Maternal Grandmother   . Diabetes Maternal Grandmother   . Coronary artery disease Paternal Grandmother   . Hypertension Paternal Grandmother   . Diabetes Paternal Grandmother   . Breast cancer Maternal Aunt   . Breast cancer Maternal Aunt   . Breast cancer Maternal Aunt     Social History:  reports that she has been smoking cigarettes. She has a 42.75 pack-year smoking history. She has never used smokeless tobacco. She reports current alcohol use. She reports current drug use. Drug: Marijuana. Patient smoked 1.5 packs of cigarettes per day for 40+ years. She is currently smoking half a pack a day. She drinks "a hair" every now and then. Patient has been on disability in 2005 for "ruptured back". She is a former [employer]. The patient is alone today.  Allergies:  Allergies  Allergen Reactions  . Carbamazepine Hives  . Divalproex Sodium Swelling and Other (See Comments)    HAIR FALLS OUT  . Clonazepam Other (See  Comments)    HALLUCINATIONS  . Diphenhydramine Hcl Rash  . Tiotropium Bromide Monohydrate Other (See Comments)    CREATES YEAST IN THROAT  . Vicodin [Hydrocodone-Acetaminophen] Itching    Current Medications: Current Outpatient Medications  Medication Sig Dispense Refill  . albuterol (VENTOLIN HFA) 108 (90 Base) MCG/ACT inhaler Inhale 2 puffs into the lungs every 6 (six) hours as needed for wheezing or shortness of breath.    . ALPRAZolam (XANAX) 1 MG tablet TAKE 1/2 TO 1 TABLET BY MOUTH 3 TIMES DAILY AS NEEDED ANXIETY 90 tablet 0  . lamoTRIgine (LAMICTAL) 100 MG tablet TAKE 1 TABLET BY MOUTH TWICE DAILY 180 tablet 3  . latanoprost (XALATAN) 0.005 % ophthalmic solution     . losartan-hydrochlorothiazide (HYZAAR) 100-12.5 MG tablet Take 1 tablet by mouth daily. 90 tablet 3  . ondansetron (ZOFRAN ODT) 4 MG disintegrating tablet Take 1 tablet (4 mg total) by mouth every 8 (eight) hours as needed for nausea or vomiting. 20 tablet 0  . pantoprazole (PROTONIX) 20 MG tablet Take 1 tablet (20 mg total) by mouth daily. 30 tablet 1  . PARoxetine (PAXIL) 20 MG tablet TAKE 1 TABLET BY MOUTH ONCE DAILY 90 tablet 3   No current facility-administered medications for this visit.    Review of Systems  Constitutional: Positive for malaise/fatigue (no energy) and weight loss (4 lbs; unitentional weight loss; baseline 105-108 ;bs). Negative for chills, diaphoresis and fever.       Feeling tired. Sleeping/napping more.  HENT: Negative for congestion, ear discharge, ear pain, hearing loss, nosebleeds and sinus pain.        Hoarse voice x 2 weeks. Post nasal drip. Sinus issues.  Eyes: Negative for blurred vision.       Early glaucoma on eye drops.  Respiratory: Positive for shortness of breath (at rest; worse). Negative for cough, hemoptysis, sputum production and stridor.   Cardiovascular: Negative for chest pain, palpitations and leg swelling.  Gastrointestinal: Positive for blood in stool, melena (with  mucus) and nausea (on Zofran). Negative for abdominal pain, constipation (on and off), diarrhea (on and off; green color), heartburn and vomiting.       No breakfast; sandwich at lunch; dinner is a larger meal. Snacking at night. Bloated stomach. Gasy some days.  Genitourinary: Negative.  Negative for dysuria, frequency, hematuria and urgency.  Musculoskeletal: Positive for joint pain (right shoulder; knees  and hands) and neck pain (right side; pain increases with movement). Negative for back pain and myalgias.  Skin: Negative for itching and rash.  Neurological: Positive for headaches. Negative for dizziness, tingling, tremors, sensory change, focal weakness and weakness.       Change in thinking from feeling tired all the time.  Endo/Heme/Allergies: Negative.  Does not bruise/bleed easily.  Psychiatric/Behavioral: Negative for depression and memory loss. The patient has insomnia (waking up every 2 hours at night). The patient is not nervous/anxious.   All other systems reviewed and are negative.  Performance status (ECOG): 2  Vitals Blood pressure (!) 146/74, pulse 68, temperature (!) 97.4 F (36.3 C), temperature source Tympanic, resp. rate 17, weight 94 lb 12.8 oz (43 kg), SpO2 98 %.   Physical Exam Vitals and nursing note reviewed.  Constitutional:      Appearance: She is not diaphoretic.     Comments: Thin woman sitting comfortably in the exam room in no acute distress.  HENT:     Head: Atraumatic.     Mouth/Throat:     Mouth: Oropharynx is clear and moist.     Pharynx: No oropharyngeal exudate.      Comments: Long gray hair.  Mask. Eyes:     General: No scleral icterus.    Extraocular Movements: EOM normal.     Conjunctiva/sclera: Conjunctivae normal.     Pupils: Pupils are equal, round, and reactive to light.     Comments: Glasses.  Blue eyes.  Neck:     Vascular: No JVD.  Cardiovascular:     Rate and Rhythm: Normal rate and regular rhythm.     Heart sounds: Normal  heart sounds. No murmur heard.   Pulmonary:     Effort: Pulmonary effort is normal. No respiratory distress.     Breath sounds: Normal breath sounds. No wheezing or rales.  Chest:     Chest wall: No tenderness.  Abdominal:     General: Bowel sounds are normal. There is no distension.     Palpations: Abdomen is soft. There is no mass.     Tenderness: There is no abdominal tenderness. There is no guarding or rebound.  Musculoskeletal:        General: No tenderness or edema. Normal range of motion.     Cervical back: Normal range of motion and neck supple.  Lymphadenopathy:     Head:     Right side of head: No preauricular, posterior auricular or occipital adenopathy.     Left side of head: No preauricular, posterior auricular or occipital adenopathy.     Cervical: No cervical adenopathy.     Upper Body:  No axillary adenopathy present.    Right upper body: No supraclavicular adenopathy.     Left upper body: No supraclavicular adenopathy.     Lower Body: No right inguinal adenopathy. No left inguinal adenopathy.  Skin:    General: Skin is warm and dry.  Neurological:     Mental Status: She is alert and oriented to person, place, and time.  Psychiatric:        Mood and Affect: Mood and affect normal.        Behavior: Behavior normal.        Thought Content: Thought content normal.        Judgment: Judgment normal.    Hospital Outpatient Visit on 02/19/2020  Component Date Value Ref Range Status  . Sodium 02/19/2020 138  135 - 145 mmol/L Final  . Potassium 02/19/2020  3.3* 3.5 - 5.1 mmol/L Final  . Chloride 02/19/2020 103  98 - 111 mmol/L Final  . CO2 02/19/2020 24  22 - 32 mmol/L Final  . Glucose, Bld 02/19/2020 102* 70 - 99 mg/dL Final   Glucose reference range applies only to samples taken after fasting for at least 8 hours.  . BUN 02/19/2020 16  6 - 20 mg/dL Final  . Creatinine, Ser 02/19/2020 1.00  0.44 - 1.00 mg/dL Final  . Calcium 02/19/2020 8.9  8.9 - 10.3 mg/dL Final    . Total Protein 02/19/2020 7.0  6.5 - 8.1 g/dL Final  . Albumin 02/19/2020 4.1  3.5 - 5.0 g/dL Final  . AST 02/19/2020 25  15 - 41 U/L Final  . ALT 02/19/2020 10  0 - 44 U/L Final  . Alkaline Phosphatase 02/19/2020 53  38 - 126 U/L Final  . Total Bilirubin 02/19/2020 0.5  0.3 - 1.2 mg/dL Final  . GFR calc non Af Amer 02/19/2020 >60  >60 mL/min Final  . GFR calc Af Amer 02/19/2020 >60  >60 mL/min Final  . Anion gap 02/19/2020 11  5 - 15 Final   Performed at [facility], [address] [place], [place] [address]    Assessment:  Molly Peters is a 59 y.o. female withiron deficiency anemiasecondary to an upper GI bleed. She presented with intermittent melena and chest pain. Hemoglobin nadir was 6.9 on 06/14/2018.Ferritin was 9, iron saturation 11% and TIBC 393 on 06/20/2018. She received 1 unit of PRBCs.  Work-up on 08/27/2019revealed a hematocrit of 31, hemoglobin 10.2, and MCV 70.8. Ferritin was 7. B12 was 165 (low). Folate was 6.3 (>5.9). TSH was 1.256.  Ferritinhas been followed: 9 on 06/20/2018, 7 on 07/05/2018, 5 on 07/19/2018, 81 on 09/12/2018, 12 on 10/24/2018, 71 on 02/07/2019, 51 on 05/09/2019, 32 on 07/28/2019, 29 on 08/28/2019, and 15 on 01/24/2020.   She received Venofer200 mg on 07/25/2018, weekly x 3 (08/15/2018 - 08/29/2018), weekly x 2 (01/10/2019 - 01/17/2019), and 01/26/2020.  She has B12 deficiency. She began oral B12on 07/06/2018. B12 was 165 on 07/05/2018, 396 on 07/19/2018, 421 (after B12 injection) on 09/12/2018, and 246 on 10/24/2018 (on oral B12). Folatewas 6.3 on 07/05/2018 and 9.2 on 09/12/2018. She began monthly B12on 08/29/2018 (last03/19/2021).  EGDon 06/14/2018 revealed esophageal mucosal changes c/w short segment of Barrett's and a non-bleeding gastric ulcers. Pathology at the GE junction revealed squamocolumnar mucosa with moderate chronic inflammation. Gastric biopsy revealed no H pylori, dysplasia, or  malignancy. EGDon 05/04/2019 revealed non-bleeding gastric ulcer with a clean ulcer base (Forrest Class III)and friable gastric mucosa. Biopsies revealed antral mild reactive gastropathy with no H pylori, metaplasia, dysplasia or malignancy.  Colonoscopyon 06/15/2018 revealed a 5 mm polyp in the sigmoid colon (tubular adenoma). Colonoscopyon 05/04/2019 revealed three 4-5 mm polyps in the sigmoid colon (hyperplastic polyps) and diverticulosis in the sigmoid colon.   Chest CT angiogramon 06/22/2018 reveled no pulmonary embolism. There was underlying emphysematous changes. There were small LUL pulmonary nodular changes (largest 3 mm). There was no thoracic adenopathy.   CT angiogramof the abdomen and pelvis on 08/08/2018 revealed no evidence of abdominal aortic aneurysm or dissection. No findings to suggest active GI bleeding on CT. There was non-vascular wall thickening involving the transverse colon extending to the splenic flexure, suspicious for infectious/inflammatory colitis. There was associated trace pelvic ascites. There was no pneumatosis or free air. C diff and GI panel by PCR was negative. She was treated with Flagyl and Augmentin.  Chest, abdomen and pelvis CT on 04/12/02021 revealed no evidence of malignancy within the chest, abdomen or pelvis to account for patient's weight loss.  There were no acute findings.  There were a few tiny bilateral pulmonary nodules none greater than 3 mm in size without significant change from the prior exam.  There was mild uniform thickening of the gastric antrum which may be secondary to peristalsis/spasm, although gastritis/antritis was possible.  BILATERAL mammogramon 07/27/2018 revealed 2 indeterminate masses in the RIGHT breast at the 2:30 and 3 o'clock positions. Follow up breast ultrasoundsshows a 6 x 4 x 5 mm oval hypoechoic mass, 1 cm from the nipple, in the 2:30 position. In the 3 o'clock position, again 1 cm from the nipple, the  was a 6 x 3 x 3 mm oval hypoechoic mass. LEFT breast ultrasound showed normal fibroglandular tissue at the 10 o'clock position. Ultrasound guided needle biopsy with clip placement on 08/03/2018. Pathology revealed revealed a dilated duct/cyst with sclerosis of the wall in the 2:30 lesion. Sample negative for atypia and malignancy. Lesion in the 3 o'clock position revealed an intraductal papilloma. Sample was negative for atypia and malignancy.  Right breast lumpectomy on 08/24/2018 revealed an intraductal papillomawith adjacent biopsy change. There was sclerotic changes suggestive of cyst/duct wall with adjacent biopsy change. There was duct ectasia and cysts in the tissue between the 2 biopsy sites. There was no atypia or malignancy.  Screening bilateral mammogramon 08/16/2019 revealed no evidence of malignancy.  Symptomatically, she feels tired.  She has nausea.  He has lost an additional 4 pounds.  She has had some black stool with mucus.  Plan: 1.   Review interim labs.  2.Weight loss             Current weight is 94 pounds.             Patient's baseline weight is 105-108 pounds.             She continues to eat but is losing weight.  She has intermittent diarrhea             Thyroid function is normal.             Patient smokes.                           Low dose chest CT 03/30/2019 revealed scattered small LLL nodules.             CT angiogramof the abdomen and pelvis on 08/08/2018 revealed no masses.             Review chest, abdomen, and pelvis CT scan from 02/19/2020.  Images personally reviewed. Agree with radiology interpretation.    There is no evidence of metastatic disease.  Follow-up with nutrition.             Follow-up with GI as scheduled with Dr. Marius Ditch or Carlyon Shadow.   Contact GI regarding evaluation prior to scheduled appointment on 04/02/2020.  Continue weight checks. 3.   Iron deficiency anemia Hematocrit 42.6.  Hemoglobin 15.0.MCV 92.6 on  01/24/2020. Ferritin15. Her diet appears to be fairly good. She notes some dark stools. She received Venofer x 1 on 01/26/2020. Follow-up with GI as noted above. 4.B12 deficiency Last B12 was on 01/26/2020. Continue B12 monthly x6 (next due 02/23/2020). Check folate annually. 5.Pulmonary nodules Low dose chest CT on 03/30/2019 revealedscattered small (up to 3.9 mm) bilateral pulmonary nodules in the left lower lobe.  Chest CT from 02/19/2020 revealed tiny bilateral pulmonary nodules (none > 3 mm). 6. GI issues Patient has chronic nausea. She notes some recent dark stools. She has intermittent diarrhea. Follow-up with Dr Bonna Gains or Dr. Marius Ditch. 7.RTC in 4 weeks for weight check. 8.   RTC in 8 weeks for MD assessment and +/- labs.  I discussed the assessment and treatment plan with the patient.  The patient was provided an opportunity to ask questions and all were answered.  The patient agreed with the plan and demonstrated an understanding of the instructions.  The patient was advised to call back if the symptoms worsen or if the condition fails to improve as anticipated.   Lequita Asal, MD, PhD    02/20/2020, 9:22 AM  I, Selena Batten, am acting as scribe for [employer]. Mike Gip, MD, PhD.  I, Ginny Loomer C. Mike Gip, MD, have reviewed the above documentation for accuracy and completeness, and I agree with the above.

## 2020-02-20 ENCOUNTER — Inpatient Hospital Stay: Payer: Medicare HMO | Attending: Hematology and Oncology | Admitting: Hematology and Oncology

## 2020-02-20 ENCOUNTER — Encounter: Payer: Self-pay | Admitting: Hematology and Oncology

## 2020-02-20 VITALS — BP 146/74 | HR 68 | Temp 97.4°F | Resp 17 | Wt 94.8 lb

## 2020-02-20 DIAGNOSIS — Z8049 Family history of malignant neoplasm of other genital organs: Secondary | ICD-10-CM | POA: Diagnosis not present

## 2020-02-20 DIAGNOSIS — Z8249 Family history of ischemic heart disease and other diseases of the circulatory system: Secondary | ICD-10-CM | POA: Insufficient documentation

## 2020-02-20 DIAGNOSIS — E538 Deficiency of other specified B group vitamins: Secondary | ICD-10-CM | POA: Insufficient documentation

## 2020-02-20 DIAGNOSIS — R110 Nausea: Secondary | ICD-10-CM | POA: Diagnosis not present

## 2020-02-20 DIAGNOSIS — D500 Iron deficiency anemia secondary to blood loss (chronic): Secondary | ICD-10-CM | POA: Diagnosis not present

## 2020-02-20 DIAGNOSIS — R918 Other nonspecific abnormal finding of lung field: Secondary | ICD-10-CM | POA: Diagnosis not present

## 2020-02-20 DIAGNOSIS — Z800 Family history of malignant neoplasm of digestive organs: Secondary | ICD-10-CM | POA: Diagnosis not present

## 2020-02-20 DIAGNOSIS — Z836 Family history of other diseases of the respiratory system: Secondary | ICD-10-CM | POA: Diagnosis not present

## 2020-02-20 DIAGNOSIS — M2550 Pain in unspecified joint: Secondary | ICD-10-CM | POA: Insufficient documentation

## 2020-02-20 DIAGNOSIS — D509 Iron deficiency anemia, unspecified: Secondary | ICD-10-CM | POA: Insufficient documentation

## 2020-02-20 DIAGNOSIS — Z803 Family history of malignant neoplasm of breast: Secondary | ICD-10-CM | POA: Diagnosis not present

## 2020-02-20 DIAGNOSIS — Z833 Family history of diabetes mellitus: Secondary | ICD-10-CM | POA: Insufficient documentation

## 2020-02-20 DIAGNOSIS — R634 Abnormal weight loss: Secondary | ICD-10-CM | POA: Diagnosis not present

## 2020-02-20 DIAGNOSIS — Z79899 Other long term (current) drug therapy: Secondary | ICD-10-CM | POA: Diagnosis not present

## 2020-02-20 DIAGNOSIS — R911 Solitary pulmonary nodule: Secondary | ICD-10-CM | POA: Insufficient documentation

## 2020-02-20 DIAGNOSIS — Z820 Family history of epilepsy and other diseases of the nervous system: Secondary | ICD-10-CM | POA: Diagnosis not present

## 2020-02-20 DIAGNOSIS — M542 Cervicalgia: Secondary | ICD-10-CM | POA: Diagnosis not present

## 2020-02-20 DIAGNOSIS — Z7189 Other specified counseling: Secondary | ICD-10-CM | POA: Insufficient documentation

## 2020-02-20 DIAGNOSIS — Z818 Family history of other mental and behavioral disorders: Secondary | ICD-10-CM | POA: Insufficient documentation

## 2020-02-20 DIAGNOSIS — Z8261 Family history of arthritis: Secondary | ICD-10-CM | POA: Insufficient documentation

## 2020-02-20 DIAGNOSIS — R5383 Other fatigue: Secondary | ICD-10-CM | POA: Diagnosis not present

## 2020-02-20 NOTE — Progress Notes (Signed)
Patient complains of abdominal, lower back and chest pain. Chest pain started a couple weeks ago. Can feel her heart beating and feels like she cant get enough air. She has COPD.

## 2020-02-22 ENCOUNTER — Inpatient Hospital Stay: Payer: Medicare HMO

## 2020-02-22 ENCOUNTER — Other Ambulatory Visit: Payer: Self-pay

## 2020-02-22 NOTE — Progress Notes (Signed)
Nutrition Assessment   Reason for Assessment: Referral from Dr. Mike Gip regarding weight loss   ASSESSMENT:  59 year old female with fe deficiency anemia, b 12 deficiency and unexplained weight loss.  Patient followed by Dr. Mike Gip.  Noted results of CT of abdomen on 4/12.21 no evidenced of malignancy.  Past medical history of COPD, cervical cancer, depression, GERD, HTN, IBS.    Met with patient in clinic this am.  Patient reports "good" appetite.  Has never been a breakfast eater.  Wakes around 6am but does not eat anything until 1:30pm (lunch).  Patient reports she typically eats 2 whole sandwiches (ham, potted meat, bolonga).  Dinner she cooks and has ready around 5:30pm.  Typically has meat (chicken, pork, hot dogs, stew beef, roast) and 2 sides (macaroni and cheese, beans, mashed potatoes). Sometimes with have evening snack. Recently was eating rum cake.  Has not noticed any particular food other than diary products that make her GI symptoms worse.  Does not drink much milk, eats cheese and macaroni and cheese knowing that stomach will be upset.  Does not eat much ice cream, pudding, etc.  Has tried a sip of boost but does not drink them regularly.  Reports that after dinner she feels bloated, belly swells.  Does not notice this after lunch time meal.  Reports has issues with constipation for few days then diarrhea.  Complains of nausea about 2 times per week and takes zofran with relief.     Medications: zofran, protonix   Labs: reviewed   Anthropometrics:   Height: 4'11" Weight: 94 lb 12.8 oz on 4/13 UBW: per patient report 116 lb 5-6 months ago.  Per chart last weighed 105 in Jan 2018 100 lb 07/28/2019 BMI: 18  6% weight loss in the last 7 months  Estimated Energy Needs  Kcals: 1300-1500 Protein: 65-75 g Fluid: > 1.3 L   NUTRITION DIAGNOSIS: Inadequate oral intake related to altered GI function as evidenced by 6% weight loss and bloating, abdominal pain, nausea.      INTERVENTION:  Encouraged patient to keep food diary and record GI symptoms Discussed daily calorie and protein goals with patient to increase weight.  Provided and reviewed IBS Nutrition Therapy handout from [facility] to perhaps help with GI symptoms.   Discussed alternative products that are lactose free.   Provided samples of oral nutrition supplements (boost, ensure enlive, orgain, [employer], boost soothe) for patient to try.  If able to tolerate can drink 1-2 per day between meals for added calories.  Encouraged patient to eat or drink shake between 6am and 1:30pm.   Encouraged patient to take nausea medications to help relieve symptoms.  Patient has contact information   MONITORING, EVALUATION, GOAL: Patient will increase calories and protein to prevent weight loss   Next Visit: May 20th face to face  Shalunda Lindh B. Zenia Resides, [place], [place] Registered Dietitian [phone] (pager)

## 2020-02-23 ENCOUNTER — Other Ambulatory Visit: Payer: Self-pay | Admitting: Hematology and Oncology

## 2020-02-23 ENCOUNTER — Inpatient Hospital Stay: Payer: Medicare HMO

## 2020-02-23 VITALS — BP 159/79 | HR 69 | Temp 97.8°F | Resp 18

## 2020-02-23 DIAGNOSIS — K219 Gastro-esophageal reflux disease without esophagitis: Secondary | ICD-10-CM

## 2020-02-23 DIAGNOSIS — D500 Iron deficiency anemia secondary to blood loss (chronic): Secondary | ICD-10-CM

## 2020-02-23 DIAGNOSIS — R911 Solitary pulmonary nodule: Secondary | ICD-10-CM | POA: Diagnosis not present

## 2020-02-23 DIAGNOSIS — Z836 Family history of other diseases of the respiratory system: Secondary | ICD-10-CM | POA: Diagnosis not present

## 2020-02-23 DIAGNOSIS — R634 Abnormal weight loss: Secondary | ICD-10-CM | POA: Diagnosis not present

## 2020-02-23 DIAGNOSIS — Z818 Family history of other mental and behavioral disorders: Secondary | ICD-10-CM | POA: Diagnosis not present

## 2020-02-23 DIAGNOSIS — M542 Cervicalgia: Secondary | ICD-10-CM | POA: Diagnosis not present

## 2020-02-23 DIAGNOSIS — Z8049 Family history of malignant neoplasm of other genital organs: Secondary | ICD-10-CM | POA: Diagnosis not present

## 2020-02-23 DIAGNOSIS — D509 Iron deficiency anemia, unspecified: Secondary | ICD-10-CM | POA: Diagnosis not present

## 2020-02-23 DIAGNOSIS — Z79899 Other long term (current) drug therapy: Secondary | ICD-10-CM | POA: Diagnosis not present

## 2020-02-23 DIAGNOSIS — Z803 Family history of malignant neoplasm of breast: Secondary | ICD-10-CM | POA: Diagnosis not present

## 2020-02-23 DIAGNOSIS — M2550 Pain in unspecified joint: Secondary | ICD-10-CM | POA: Diagnosis not present

## 2020-02-23 DIAGNOSIS — Z800 Family history of malignant neoplasm of digestive organs: Secondary | ICD-10-CM | POA: Diagnosis not present

## 2020-02-23 DIAGNOSIS — Z8249 Family history of ischemic heart disease and other diseases of the circulatory system: Secondary | ICD-10-CM | POA: Diagnosis not present

## 2020-02-23 DIAGNOSIS — E538 Deficiency of other specified B group vitamins: Secondary | ICD-10-CM | POA: Diagnosis not present

## 2020-02-23 DIAGNOSIS — Z833 Family history of diabetes mellitus: Secondary | ICD-10-CM | POA: Diagnosis not present

## 2020-02-23 DIAGNOSIS — Z8261 Family history of arthritis: Secondary | ICD-10-CM | POA: Diagnosis not present

## 2020-02-23 DIAGNOSIS — Z820 Family history of epilepsy and other diseases of the nervous system: Secondary | ICD-10-CM | POA: Diagnosis not present

## 2020-02-23 DIAGNOSIS — R5383 Other fatigue: Secondary | ICD-10-CM | POA: Diagnosis not present

## 2020-02-23 MED ORDER — CYANOCOBALAMIN 1000 MCG/ML IJ SOLN
1000.0000 ug | Freq: Once | INTRAMUSCULAR | Status: AC
  Administered 2020-02-23: 1000 ug via INTRAMUSCULAR

## 2020-03-12 ENCOUNTER — Other Ambulatory Visit: Payer: Self-pay

## 2020-03-12 ENCOUNTER — Encounter: Payer: Self-pay | Admitting: Gastroenterology

## 2020-03-12 ENCOUNTER — Ambulatory Visit (INDEPENDENT_AMBULATORY_CARE_PROVIDER_SITE_OTHER): Payer: Medicare HMO | Admitting: Gastroenterology

## 2020-03-12 VITALS — BP 117/54 | HR 71 | Temp 98.1°F | Wt 94.4 lb

## 2020-03-12 DIAGNOSIS — Z8719 Personal history of other diseases of the digestive system: Secondary | ICD-10-CM | POA: Diagnosis not present

## 2020-03-12 DIAGNOSIS — Z8711 Personal history of peptic ulcer disease: Secondary | ICD-10-CM

## 2020-03-12 DIAGNOSIS — D509 Iron deficiency anemia, unspecified: Secondary | ICD-10-CM | POA: Diagnosis not present

## 2020-03-12 DIAGNOSIS — R634 Abnormal weight loss: Secondary | ICD-10-CM | POA: Diagnosis not present

## 2020-03-12 DIAGNOSIS — D508 Other iron deficiency anemias: Secondary | ICD-10-CM

## 2020-03-12 DIAGNOSIS — R197 Diarrhea, unspecified: Secondary | ICD-10-CM

## 2020-03-12 MED ORDER — OMEPRAZOLE 40 MG PO CPDR: 40.0000 mg | DELAYED_RELEASE_CAPSULE | Freq: Two times a day (BID) | ORAL | 0 refills | Status: DC

## 2020-03-12 NOTE — Patient Instructions (Signed)
Take 2 tablets with each meal and 1 tablet with each snack

## 2020-03-12 NOTE — Progress Notes (Signed)
Cephas Darby, MD [address]  [place]  [place], [place] [address]  Main: [phone]  Fax: [phone] Pager: [phone]   Primary Care Physician: Venia Carbon, MD  Primary Gastroenterologist:  Dr. Bonna Gains  Chief Complaint  Patient presents with  . Nausea    Patient states she has nausea off and on during the day   . Abdominal Pain    Patient has sharp pains in stomach that come and go. Patient has swelling   . Constipation    Patient goes from  constipation to diarrhea     HPI: Molly Peters is a 59 y.o. female chronic tobacco use, known history of iron deficiency anemia, peptic ulcer disease secondary to NSAID use.  Patient has been seen by Dr. Bonna Gains in the past, underwent bidirectional endoscopy, found to have gastric ulcer with no evidence of H. pylori.  Patient has been sent by Dr. Mike Gip for follow-up again because of weight loss and abdominal pain.  Patient reports that she has been experiencing upper abdominal discomfort radiating to the back associated with severe abdominal bloating, worse after eating.  She remains bloated throughout the day.  She also acknowledges having nonbloody diarrhea for a long time, intermittent black stools.  She continues to lose weight even though she tries to eat more.  Her nutritionist has started her on routine supplements.  She continues to smoke 1 pack/day, since age of 34.  She has also noticed easy bruising  Current Outpatient Medications  Medication Sig Dispense Refill  . albuterol (VENTOLIN HFA) 108 (90 Base) MCG/ACT inhaler Inhale 2 puffs into the lungs every 6 (six) hours as needed for wheezing or shortness of breath.    . ALPRAZolam (XANAX) 1 MG tablet TAKE 1/2 TO 1 TABLET BY MOUTH 3 TIMES DAILY AS NEEDED ANXIETY 90 tablet 0  . lamoTRIgine (LAMICTAL) 100 MG tablet TAKE 1 TABLET BY MOUTH TWICE DAILY 180 tablet 3  . latanoprost (XALATAN) 0.005 % ophthalmic solution     . losartan-hydrochlorothiazide  (HYZAAR) 100-12.5 MG tablet Take 1 tablet by mouth daily. 90 tablet 3  . ondansetron (ZOFRAN ODT) 4 MG disintegrating tablet Take 1 tablet (4 mg total) by mouth every 8 (eight) hours as needed for nausea or vomiting. 20 tablet 0  . pantoprazole (PROTONIX) 20 MG tablet TAKE 1 TABLET BY MOUTH ONCE DAILY 30 tablet 1  . PARoxetine (PAXIL) 20 MG tablet TAKE 1 TABLET BY MOUTH ONCE DAILY 90 tablet 3  . omeprazole (PRILOSEC) 40 MG capsule Take 1 capsule (40 mg total) by mouth 2 (two) times daily before a meal. 60 capsule 0   No current facility-administered medications for this visit.    Allergies as of 03/12/2020 - Review Complete 03/12/2020  Allergen Reaction Noted  . Carbamazepine Hives 08/26/2015  . Divalproex sodium Swelling and Other (See Comments) 01/01/2016  . Clonazepam Other (See Comments) 08/12/2005  . Diphenhydramine hcl Rash 08/26/2015  . Tiotropium bromide monohydrate Other (See Comments)   . Vicodin [hydrocodone-acetaminophen] Itching 11/15/2014    NSAIDs: BC powder about 3 times a week  Antiplts/Anticoagulants/Anti thrombotics: None  GI procedures: EGD and colonoscopy 05/04/2019 - Normal esophagus. - Non-bleeding gastric ulcer with a clean ulcer base (Forrest Class III). Biopsied. - Friable gastric mucosa. Biopsied. - Normal examined duodenum. - Biopsies were obtained in the gastric body, at the incisura and in the gastric antrum.  - Preparation of the colon was fair. - Three 4 to 5 mm polyps in the sigmoid colon, removed  with a jumbo cold forceps. Resected and retrieved. - Diverticulosis in the sigmoid colon. - The examination was otherwise normal. - The rectum, sigmoid colon, descending colon, transverse colon, ascending colon and cecum are normal. - The distal rectum and anal verge are normal on retroflexion view.  DIAGNOSIS:  A. STOMACH, ANTRUM, INCISURA, AND BODY; COLD BIOPSY:  - ANTRAL MILD REACTIVE GASTROPATHY.  - OXYNTIC MUCOSA WITH MILD EDEMA AND CONGESTION.    - NEGATIVE FOR ACTIVE INFLAMMATION, H. PYLORI, INTESTINAL METAPLASIA,  DYSPLASIA, AND MALIGNANCY.   B. STOMACH, FRIABLE MUCOSA; COLD BIOPSY:  - OXYNTIC MUCOSA WITH MILD EDEMA AND CONGESTION.  - NEGATIVE FOR H. PYLORI, INTESTINAL METAPLASIA, ATROPHY, DYSPLASIA, AND  MALIGNANCY.   C. COLON POLYP X 3, SIGMOID; COLD BIOPSY:  - HYPERPLASTIC POLYPS, 4 FRAGMENTS.  - NEGATIVE FOR DYSPLASIA AND MALIGNANCY.   ROS:  General: Positive for weight loss, negative for anorexia, fever, chills, fatigue, weakness. ENT: Negative for hoarseness, difficulty swallowing , nasal congestion. CV: Negative for chest pain, angina, palpitations, dyspnea on exertion, peripheral edema.  Respiratory: Negative for dyspnea at rest, dyspnea on exertion, cough, sputum, wheezing.  GI: See history of present illness. GU:  Negative for dysuria, hematuria, urinary incontinence, urinary frequency, nocturnal urination.    Physical Examination:   BP (!) 117/54 (BP Location: Left Arm, Patient Position: Sitting, Cuff Size: Normal)   Pulse 71   Temp 98.1 F (36.7 C) (Oral)   Wt 94 lb 6 oz (42.8 kg)   BMI 18.43 kg/m   General: Thin built, poorly nourished in no acute distress.  Eyes: No icterus. Conjunctivae pink. Mouth: Oropharyngeal mucosa moist and pink , no lesions erythema or exudate. Lungs: Clear to auscultation bilaterally. Non-labored. Heart: Regular rate and rhythm, no murmurs rubs or gallops.  Abdomen: Bowel sounds are normal, epigastric tenderness, moderately distended, tympanic to percussion, no hepatosplenomegaly or masses, no hernia , no rebound or guarding.   Extremities: No lower extremity edema. No clubbing or deformities. Neuro: Alert and oriented x 3.  Grossly intact. Skin: Warm and dry, no jaundice.   Psych: Alert and cooperative, normal mood and affect.   Imaging Studies: CT CHEST W CONTRAST  Result Date: 02/19/2020 CLINICAL DATA:  Unintentional weight loss 10 pounds over the past 6 months.  Finding in 50 with upper abdominal tenderness and distension. Intermittent rectal bleeding. Shortness of breath over the past week. EXAM: CT CHEST, ABDOMEN, AND PELVIS WITH CONTRAST TECHNIQUE: Multidetector CT imaging of the chest, abdomen and pelvis was performed following the standard protocol during bolus administration of intravenous contrast. CONTRAST:  22mL OMNIPAQUE IOHEXOL 300 MG/ML  SOLN COMPARISON:  Chest CT 03/30/2019 and abdominopelvic CT 08/08/2018 FINDINGS: CT CHEST FINDINGS Cardiovascular: Heart is normal size. There is calcified plaque over the 3 vessel coronary arteries. Thoracic aorta is unremarkable without aneurysm or dissection. Remaining vascular structures are unremarkable. Mediastinum/Nodes: No evidence of mediastinal or hilar adenopathy. Remaining mediastinal structures are normal. Lungs/Pleura: Lungs are adequately inflated demonstrate mild centrilobular emphysematous disease. Subpleural 3 mm nodular density over the lateral left upper lobe unchanged. Subpleural 2 mm nodular density over the left lower lobe slightly smaller. 1-2 mm subpleural nodule over the posterolateral right upper lobe unchanged. No lobar consolidation or effusion. Airways are normal. Musculoskeletal: Degenerative change of the spine with partially visualized fusion hardware over the cervical spine. Mild to moderate biphasic curvature of the thoracolumbar spine. CT ABDOMEN PELVIS FINDINGS Hepatobiliary: Liver, gallbladder and biliary tree are normal. Pancreas: Normal. Spleen: Normal. Adrenals/Urinary Tract: Adrenal glands are normal. Kidneys  are normal in size without hydronephrosis or nephrolithiasis. Ureters and bladder are normal. Stomach/Bowel: Mild thickening over the region of the gastric antrum without ulceration or adjacent inflammatory change. Small bowel is unremarkable. Appendix is normal. Minimal diverticulosis of the sigmoid colon. Vascular/Lymphatic: Mild calcified plaque over the abdominal aorta which is  normal in caliber. No evidence of adenopathy. Reproductive: Previous hysterectomy. Other: No free fluid or focal inflammatory change. Musculoskeletal: Biphasic curvature of the thoracolumbar spine. Mild degenerative changes throughout the spine. There is posterior fusion hardware intact L3-L5. Intervertebral cage at the L5-S1 level. IMPRESSION: 1. No evidence of malignancy within the chest, abdomen or pelvis to account for patient's weight loss. No acute findings. 2. Emphysematous disease. Few tiny bilateral pulmonary nodules none greater than 3 mm in size without significant change from the prior exam. Recommend annual low-dose CT lung cancer screening as suggested previously. 3.  Mild colonic diverticulosis. 4. Aortic Atherosclerosis (ICD10-I70.0) and Emphysema (ICD10-J43.9). Atherosclerotic coronary artery disease. 5. Mild uniform thickening of the gastric antrum which may be secondary to peristalsis/spasm, although gastritis/antritis is possible. Electronically Signed   By: Marin Olp M.D.   On: 02/19/2020 14:49   CT ABDOMEN PELVIS W CONTRAST  Result Date: 02/19/2020 CLINICAL DATA:  Unintentional weight loss 10 pounds over the past 6 months. Finding in 50 with upper abdominal tenderness and distension. Intermittent rectal bleeding. Shortness of breath over the past week. EXAM: CT CHEST, ABDOMEN, AND PELVIS WITH CONTRAST TECHNIQUE: Multidetector CT imaging of the chest, abdomen and pelvis was performed following the standard protocol during bolus administration of intravenous contrast. CONTRAST:  48mL OMNIPAQUE IOHEXOL 300 MG/ML  SOLN COMPARISON:  Chest CT 03/30/2019 and abdominopelvic CT 08/08/2018 FINDINGS: CT CHEST FINDINGS Cardiovascular: Heart is normal size. There is calcified plaque over the 3 vessel coronary arteries. Thoracic aorta is unremarkable without aneurysm or dissection. Remaining vascular structures are unremarkable. Mediastinum/Nodes: No evidence of mediastinal or hilar adenopathy.  Remaining mediastinal structures are normal. Lungs/Pleura: Lungs are adequately inflated demonstrate mild centrilobular emphysematous disease. Subpleural 3 mm nodular density over the lateral left upper lobe unchanged. Subpleural 2 mm nodular density over the left lower lobe slightly smaller. 1-2 mm subpleural nodule over the posterolateral right upper lobe unchanged. No lobar consolidation or effusion. Airways are normal. Musculoskeletal: Degenerative change of the spine with partially visualized fusion hardware over the cervical spine. Mild to moderate biphasic curvature of the thoracolumbar spine. CT ABDOMEN PELVIS FINDINGS Hepatobiliary: Liver, gallbladder and biliary tree are normal. Pancreas: Normal. Spleen: Normal. Adrenals/Urinary Tract: Adrenal glands are normal. Kidneys are normal in size without hydronephrosis or nephrolithiasis. Ureters and bladder are normal. Stomach/Bowel: Mild thickening over the region of the gastric antrum without ulceration or adjacent inflammatory change. Small bowel is unremarkable. Appendix is normal. Minimal diverticulosis of the sigmoid colon. Vascular/Lymphatic: Mild calcified plaque over the abdominal aorta which is normal in caliber. No evidence of adenopathy. Reproductive: Previous hysterectomy. Other: No free fluid or focal inflammatory change. Musculoskeletal: Biphasic curvature of the thoracolumbar spine. Mild degenerative changes throughout the spine. There is posterior fusion hardware intact L3-L5. Intervertebral cage at the L5-S1 level. IMPRESSION: 1. No evidence of malignancy within the chest, abdomen or pelvis to account for patient's weight loss. No acute findings. 2. Emphysematous disease. Few tiny bilateral pulmonary nodules none greater than 3 mm in size without significant change from the prior exam. Recommend annual low-dose CT lung cancer screening as suggested previously. 3.  Mild colonic diverticulosis. 4. Aortic Atherosclerosis (ICD10-I70.0) and Emphysema  (ICD10-J43.9). Atherosclerotic coronary artery  disease. 5. Mild uniform thickening of the gastric antrum which may be secondary to peristalsis/spasm, although gastritis/antritis is possible. Electronically Signed   By: Marin Olp M.D.   On: 02/19/2020 14:49    Assessment and Plan:   AIDAH HOLIMAN is a 59 y.o. female history of chronic tobacco use, NSAID use, known history of peptic ulcer disease, no evidence of H. pylori, history of iron deficiency anemia which is resolved is seen for follow-up of abdominal pain and ongoing weight loss  Upper abdominal pain: Patient has ongoing NSAID use, and tobacco use Differentials include chronic peptic ulcer disease or chronic pancreatitis or combination of both CT abdomen and pelvis with contrast on 02/19/2020 revealed normal pancreas.  Gastric antral thickening Recommend EGD given history of gastric ulcers Start omeprazole 40 mg 2 times a day  Weight loss and diarrhea Concern for exocrine pancreatic insufficiency Trial of Zenpep, samples provided Check pancreatic fecal elastase Check vitamin A, D, E and K levels Strongly reiterated to the patient to cut back on smoking   Low ferritin, normal hemoglobin, patient is followed by Dr. Mike Gip  Follow up with Dr. Bonna Gains in 4 weeks   Dr Sherri Sear, MD

## 2020-03-13 ENCOUNTER — Other Ambulatory Visit: Payer: Self-pay

## 2020-03-13 ENCOUNTER — Other Ambulatory Visit
Admission: RE | Admit: 2020-03-13 | Discharge: 2020-03-13 | Disposition: A | Discharge status: Home / Self Care | Payer: Medicare HMO | Source: Ambulatory Visit | Attending: Gastroenterology | Admitting: Gastroenterology

## 2020-03-13 DIAGNOSIS — Z20822 Contact with and (suspected) exposure to covid-19: Secondary | ICD-10-CM | POA: Diagnosis not present

## 2020-03-13 DIAGNOSIS — Z01812 Encounter for preprocedural laboratory examination: Secondary | ICD-10-CM | POA: Insufficient documentation

## 2020-03-13 LAB — SARS CORONAVIRUS 2 (TAT 6-24 HRS): SARS Coronavirus 2: NEGATIVE

## 2020-03-14 ENCOUNTER — Other Ambulatory Visit: Payer: Medicare HMO

## 2020-03-16 ENCOUNTER — Other Ambulatory Visit: Payer: Self-pay | Admitting: Gastroenterology

## 2020-03-18 ENCOUNTER — Encounter
Admission: RE | Disposition: A | Discharge status: Home / Self Care | Payer: Self-pay | Source: Home / Self Care | Attending: Gastroenterology

## 2020-03-18 ENCOUNTER — Telehealth: Payer: Self-pay

## 2020-03-18 ENCOUNTER — Other Ambulatory Visit: Payer: Self-pay

## 2020-03-18 ENCOUNTER — Ambulatory Visit: Payer: Medicare HMO | Admitting: Anesthesiology

## 2020-03-18 ENCOUNTER — Ambulatory Visit
Admission: RE | Admit: 2020-03-18 | Discharge: 2020-03-18 | Disposition: A | Discharge status: Home / Self Care | Payer: Medicare HMO | Attending: Gastroenterology | Admitting: Gastroenterology

## 2020-03-18 ENCOUNTER — Other Ambulatory Visit: Payer: Self-pay | Admitting: Gastroenterology

## 2020-03-18 DIAGNOSIS — R1013 Epigastric pain: Secondary | ICD-10-CM | POA: Insufficient documentation

## 2020-03-18 DIAGNOSIS — F419 Anxiety disorder, unspecified: Secondary | ICD-10-CM | POA: Insufficient documentation

## 2020-03-18 DIAGNOSIS — Z833 Family history of diabetes mellitus: Secondary | ICD-10-CM | POA: Insufficient documentation

## 2020-03-18 DIAGNOSIS — Z681 Body mass index (BMI) 19 or less, adult: Secondary | ICD-10-CM | POA: Insufficient documentation

## 2020-03-18 DIAGNOSIS — Z8719 Personal history of other diseases of the digestive system: Secondary | ICD-10-CM | POA: Diagnosis not present

## 2020-03-18 DIAGNOSIS — M549 Dorsalgia, unspecified: Secondary | ICD-10-CM | POA: Diagnosis not present

## 2020-03-18 DIAGNOSIS — J449 Chronic obstructive pulmonary disease, unspecified: Secondary | ICD-10-CM | POA: Diagnosis not present

## 2020-03-18 DIAGNOSIS — I10 Essential (primary) hypertension: Secondary | ICD-10-CM | POA: Diagnosis not present

## 2020-03-18 DIAGNOSIS — Z825 Family history of asthma and other chronic lower respiratory diseases: Secondary | ICD-10-CM | POA: Insufficient documentation

## 2020-03-18 DIAGNOSIS — Z79899 Other long term (current) drug therapy: Secondary | ICD-10-CM | POA: Insufficient documentation

## 2020-03-18 DIAGNOSIS — Z90710 Acquired absence of both cervix and uterus: Secondary | ICD-10-CM | POA: Diagnosis not present

## 2020-03-18 DIAGNOSIS — K3189 Other diseases of stomach and duodenum: Secondary | ICD-10-CM | POA: Insufficient documentation

## 2020-03-18 DIAGNOSIS — Z888 Allergy status to other drugs, medicaments and biological substances status: Secondary | ICD-10-CM | POA: Insufficient documentation

## 2020-03-18 DIAGNOSIS — R197 Diarrhea, unspecified: Secondary | ICD-10-CM

## 2020-03-18 DIAGNOSIS — Z981 Arthrodesis status: Secondary | ICD-10-CM | POA: Insufficient documentation

## 2020-03-18 DIAGNOSIS — K219 Gastro-esophageal reflux disease without esophagitis: Secondary | ICD-10-CM | POA: Insufficient documentation

## 2020-03-18 DIAGNOSIS — Z96611 Presence of right artificial shoulder joint: Secondary | ICD-10-CM | POA: Insufficient documentation

## 2020-03-18 DIAGNOSIS — G8929 Other chronic pain: Secondary | ICD-10-CM | POA: Diagnosis not present

## 2020-03-18 DIAGNOSIS — M26609 Unspecified temporomandibular joint disorder, unspecified side: Secondary | ICD-10-CM | POA: Insufficient documentation

## 2020-03-18 DIAGNOSIS — Z885 Allergy status to narcotic agent status: Secondary | ICD-10-CM | POA: Insufficient documentation

## 2020-03-18 DIAGNOSIS — Z96612 Presence of left artificial shoulder joint: Secondary | ICD-10-CM | POA: Insufficient documentation

## 2020-03-18 DIAGNOSIS — Z8249 Family history of ischemic heart disease and other diseases of the circulatory system: Secondary | ICD-10-CM | POA: Insufficient documentation

## 2020-03-18 DIAGNOSIS — R011 Cardiac murmur, unspecified: Secondary | ICD-10-CM | POA: Diagnosis not present

## 2020-03-18 DIAGNOSIS — R413 Other amnesia: Secondary | ICD-10-CM | POA: Diagnosis not present

## 2020-03-18 DIAGNOSIS — F418 Other specified anxiety disorders: Secondary | ICD-10-CM | POA: Diagnosis not present

## 2020-03-18 DIAGNOSIS — F17210 Nicotine dependence, cigarettes, uncomplicated: Secondary | ICD-10-CM | POA: Diagnosis not present

## 2020-03-18 DIAGNOSIS — K580 Irritable bowel syndrome with diarrhea: Secondary | ICD-10-CM | POA: Diagnosis not present

## 2020-03-18 DIAGNOSIS — F329 Major depressive disorder, single episode, unspecified: Secondary | ICD-10-CM | POA: Diagnosis not present

## 2020-03-18 DIAGNOSIS — Z820 Family history of epilepsy and other diseases of the nervous system: Secondary | ICD-10-CM | POA: Insufficient documentation

## 2020-03-18 DIAGNOSIS — Z8711 Personal history of peptic ulcer disease: Secondary | ICD-10-CM | POA: Diagnosis not present

## 2020-03-18 DIAGNOSIS — Z818 Family history of other mental and behavioral disorders: Secondary | ICD-10-CM | POA: Insufficient documentation

## 2020-03-18 DIAGNOSIS — D649 Anemia, unspecified: Secondary | ICD-10-CM | POA: Insufficient documentation

## 2020-03-18 DIAGNOSIS — Z8049 Family history of malignant neoplasm of other genital organs: Secondary | ICD-10-CM | POA: Insufficient documentation

## 2020-03-18 DIAGNOSIS — Z803 Family history of malignant neoplasm of breast: Secondary | ICD-10-CM | POA: Insufficient documentation

## 2020-03-18 DIAGNOSIS — M797 Fibromyalgia: Secondary | ICD-10-CM | POA: Diagnosis not present

## 2020-03-18 DIAGNOSIS — Z8541 Personal history of malignant neoplasm of cervix uteri: Secondary | ICD-10-CM | POA: Insufficient documentation

## 2020-03-18 DIAGNOSIS — K259 Gastric ulcer, unspecified as acute or chronic, without hemorrhage or perforation: Secondary | ICD-10-CM | POA: Diagnosis not present

## 2020-03-18 DIAGNOSIS — R634 Abnormal weight loss: Secondary | ICD-10-CM | POA: Diagnosis not present

## 2020-03-18 DIAGNOSIS — Z8261 Family history of arthritis: Secondary | ICD-10-CM | POA: Insufficient documentation

## 2020-03-18 DIAGNOSIS — Z800 Family history of malignant neoplasm of digestive organs: Secondary | ICD-10-CM | POA: Insufficient documentation

## 2020-03-18 DIAGNOSIS — D508 Other iron deficiency anemias: Secondary | ICD-10-CM | POA: Diagnosis not present

## 2020-03-18 HISTORY — PX: ESOPHAGOGASTRODUODENOSCOPY (EGD) WITH PROPOFOL: SHX5813

## 2020-03-18 SURGERY — ESOPHAGOGASTRODUODENOSCOPY (EGD) WITH PROPOFOL
Anesthesia: General

## 2020-03-18 MED ORDER — SODIUM CHLORIDE 0.9 % IV SOLN
INTRAVENOUS | Status: DC
  Administered 2020-03-18: 1000 mL via INTRAVENOUS

## 2020-03-18 MED ORDER — PROPOFOL 500 MG/50ML IV EMUL
INTRAVENOUS | Status: DC | PRN
  Administered 2020-03-18: 120 ug/kg/min via INTRAVENOUS

## 2020-03-18 MED ORDER — MIDAZOLAM HCL 5 MG/5ML IJ SOLN
INTRAMUSCULAR | Status: DC | PRN
  Administered 2020-03-18: 2 mg via INTRAVENOUS

## 2020-03-18 MED ORDER — GLYCOPYRROLATE 0.2 MG/ML IJ SOLN
INTRAMUSCULAR | Status: DC | PRN
  Administered 2020-03-18: .2 mg via INTRAVENOUS

## 2020-03-18 MED ORDER — MIDAZOLAM HCL 2 MG/2ML IJ SOLN
INTRAMUSCULAR | Status: AC
  Filled 2020-03-18: qty 2

## 2020-03-18 MED ORDER — LIDOCAINE 2% (20 MG/ML) 5 ML SYRINGE
INTRAMUSCULAR | Status: DC | PRN
  Administered 2020-03-18: 25 mg via INTRAVENOUS

## 2020-03-18 MED ORDER — PROPOFOL 10 MG/ML IV BOLUS
INTRAVENOUS | Status: DC | PRN
  Administered 2020-03-18: 70 mg via INTRAVENOUS

## 2020-03-18 MED ORDER — PROPOFOL 500 MG/50ML IV EMUL
INTRAVENOUS | Status: AC
  Filled 2020-03-18: qty 50

## 2020-03-18 NOTE — H&P (Signed)
Cephas Darby, MD [address]  [place]  [place], [place] [address]  Main: [phone]  Fax: [phone] Pager: [phone]  Primary Care Physician:  Venia Carbon, MD Primary Gastroenterologist:  Dr. Cephas Darby  Pre-Procedure History & Physical: HPI:  Molly Peters is a 59 y.o. female is here for an endoscopy.   Past Medical History:  Diagnosis Date  . Anxiety   . Cervical cancer ([place]) 1980's  . Chronic back pain 11/12/2009   Qualifier: Diagnosis of  By: Maxie Better FNP, Rosalita Levan   . COPD (chronic obstructive pulmonary disease) (HCC)    states SOB with ADLs; no O2 use; able to speak in complete sentences without SOB(11/15/2014)  . Depression   . Difficulty swallowing pills    s/p cervical fusion  . Family history of adverse reaction to anesthesia    pt's mother and sister have hx. of post-op N/V  . Fibromyalgia   . GERD (gastroesophageal reflux disease)   . Heart murmur   . History of gastric ulcer   . Hypertension    states under control with med., has been on med. x 1-2 yr.  . IBS (irritable bowel syndrome)   . Internal hemorrhoid    states has had intermittent bright red bleeding with BM (11/15/2014)  . Intraductal papilloma of breast, right 08/08/2018  . Limited joint range of motion    neck - s/p cervical fusion  . Localized primary osteoarthritis of right shoulder region 11/23/2014  . Osteoarthritis of left shoulder 07/05/2015  . Osteoarthritis of right shoulder 11/2014  . Pneumonia   . Seizures ([place])    last seizure 2010  . Short-term memory loss   . TMJ syndrome   . Valvular heart disease    Initial workup a number of years ago at [facility].  Echo (10/2009) showed EF 60-65%,      normal LV size, moderate aortic insufficiency with a trileaflet aortic valve and normal aortic root size.  Mild      mitral regurgitation.   . Wears dentures    lower    Past Surgical History:  Procedure Laterality Date  . ABDOMINAL HYSTERECTOMY     partial  .  ANTERIOR CERVICAL DECOMP/DISCECTOMY FUSION  02/06/2011   C4-5, C5-6, C6-7  . BREAST BIOPSY Bilateral 10+ yrs ago   neg  . BREAST BIOPSY Right 08/03/2018   2 areas bx, -neg  . BREAST LUMPECTOMY Bilateral 1989   x 3 - benign  . BREAST LUMPECTOMY Right 08/24/2018   Procedure: BREAST LUMPECTOMY WITH ULTRASOUND IN OR;  Surgeon: Vickie Epley, MD;  Location: [facility] ORS;  Service: General;  Laterality: Right;  . CARDIAC CATHETERIZATION  1995  . CESAREAN SECTION     x 2  . COLONOSCOPY  09/28/2008  . COLONOSCOPY N/A 06/15/2018   Procedure: COLONOSCOPY;  Surgeon: Virgel Manifold, MD;  Location: [facility] ENDOSCOPY;  Service: Endoscopy;  Laterality: N/A;  . COLONOSCOPY WITH PROPOFOL N/A 05/04/2019   Procedure: COLONOSCOPY WITH PROPOFOL;  Surgeon: Virgel Manifold, MD;  Location: [facility] ENDOSCOPY;  Service: Endoscopy;  Laterality: N/A;  . ESOPHAGOGASTRODUODENOSCOPY  09/28/2008  . ESOPHAGOGASTRODUODENOSCOPY N/A 06/14/2018   Procedure: ESOPHAGOGASTRODUODENOSCOPY (EGD);  Surgeon: Virgel Manifold, MD;  Location[facility];  Service: Endoscopy;  Laterality: N/A;  . ESOPHAGOGASTRODUODENOSCOPY (EGD) WITH PROPOFOL N/A 05/04/2019   Procedure: ESOPHAGOGASTRODUODENOSCOPY (EGD) WITH PROPOFOL;  Surgeon: Virgel Manifold, MD;  Location: [facility] ENDOSCOPY;  Service: Endoscopy;  Laterality: N/A;  . HARDWARE REMOVAL  11/17/2006  L5-S1  . LAMINECTOMY WITH POSTERIOR LATERAL ARTHRODESIS LEVEL 3  12/06/2009   with synovial cyst resection L3-4 bilat.  Marland Kitchen LAMINECTOMY WITH POSTERIOR LATERAL ARTHRODESIS LEVEL 3  11/17/2006   L4-5  . NM MYOVIEW LTD     Lexiscan myoview (10/2009): EF 77%, normal wall motion, normal perfusion.   Marland Kitchen SHOULDER ARTHROSCOPY WITH DEBRIDEMENT AND BICEP TENDON REPAIR Right 04/13/2014   Procedure: RIGHT SHOULDER ARTHROSCOPY WITH DEBRIDEMENT EXTENSIVE;  Surgeon: Johnny Bridge, MD;  Location: [facility];  Service: Orthopedics;  Laterality: Right;  . TOTAL SHOULDER ARTHROPLASTY Right  11/23/2014   Procedure: TOTAL RIGHT SHOULDER ARTHROPLASTY;  Surgeon: Johnny Bridge, MD;  Location: [facility];  Service: Orthopedics;  Laterality: Right;  . TOTAL SHOULDER ARTHROPLASTY Left 07/05/2015   Procedure: LEFT TOTAL SHOULDER REPLACEMENT;  Surgeon: Marchia Bond, MD;  Location: [facility];  Service: Orthopedics;  Laterality: Left;    Prior to Admission medications   Medication Sig Start Date End Date Taking? Authorizing Provider  albuterol (VENTOLIN HFA) 108 (90 Base) MCG/ACT inhaler Inhale 2 puffs into the lungs every 6 (six) hours as needed for wheezing or shortness of breath.    [provider]  ALPRAZolam Duanne Moron) 1 MG tablet TAKE 1/2 TO 1 TABLET BY MOUTH 3 TIMES DAILY AS NEEDED ANXIETY 02/06/20   Viviana Simpler I, MD  lamoTRIgine (LAMICTAL) 100 MG tablet TAKE 1 TABLET BY MOUTH TWICE DAILY 07/14/19   Venia Carbon, MD  latanoprost (XALATAN) 0.005 % ophthalmic solution  12/18/19   [provider]  losartan-hydrochlorothiazide (HYZAAR) 100-12.5 MG tablet Take 1 tablet by mouth daily. 10/12/19   Venia Carbon, MD  omeprazole (PRILOSEC) 40 MG capsule Take 1 capsule (40 mg total) by mouth 2 (two) times daily before a meal. 03/12/20   Jennefer Kopp, Tally Due, MD  ondansetron (ZOFRAN ODT) 4 MG disintegrating tablet Take 1 tablet (4 mg total) by mouth every 8 (eight) hours as needed for nausea or vomiting. 01/29/20   Lequita Asal, MD  pantoprazole (PROTONIX) 20 MG tablet TAKE 1 TABLET BY MOUTH ONCE DAILY 02/23/20   Lequita Asal, MD  PARoxetine (PAXIL) 20 MG tablet TAKE 1 TABLET BY MOUTH ONCE DAILY 07/27/19   Venia Carbon, MD    Allergies as of 03/12/2020 - Review Complete 03/12/2020  Allergen Reaction Noted  . Carbamazepine Hives 08/26/2015  . Divalproex sodium Swelling and Other (See Comments) 01/01/2016  . Clonazepam Other (See Comments) 08/12/2005  . Diphenhydramine hcl Rash 08/26/2015  . Tiotropium bromide monohydrate Other  (See Comments)   . Vicodin [hydrocodone-acetaminophen] Itching 11/15/2014    Family History  Problem Relation Age of Onset  . Cervical cancer Mother   . Anesthesia problems Mother        post-op N/V  . Rheum arthritis Mother   . Anxiety disorder Mother   . Cancer Father        colon cancer  . Migraines Sister   . Cervical cancer Sister   . Anesthesia problems Sister        post-op N/V  . Migraines Brother   . Asthma Daughter   . Cerebral palsy Daughter        age 50  . Coronary artery disease Maternal Grandmother   . Hypertension Maternal Grandmother   . Diabetes Maternal Grandmother   . Coronary artery disease Paternal Grandmother   . Hypertension Paternal Grandmother   . Diabetes Paternal Grandmother   . Breast cancer Maternal Aunt   .  Breast cancer Maternal Aunt   . Breast cancer Maternal Aunt     Social History   Socioeconomic History  . Marital status: Married    Spouse name: Not on file  . Number of children: 3  . Years of education: Not on file  . Highest education level: Not on file  Occupational History  . Occupation: Disabled 2003  Tobacco Use  . Smoking status: Current Every Day Smoker    Packs/day: 1.00    Years: 42.75    Pack years: 42.75    Types: Cigarettes  . Smokeless tobacco: Never Used  Substance and Sexual Activity  . Alcohol use: Yes    Alcohol/week: 0.0 standard drinks    Comment: rare  . Drug use: Yes    Types: Marijuana    Comment: per pt she smoke thc at bedtime and prn  ( for pain relief and to help sleep since not given percocet anymore )-per pt   . Sexual activity: Not on file  Other Topics Concern  . Not on file  Social History Narrative   No living will   Husband, then oldest son Roosvelt Harps, should make decisions   Would accept resuscitation--but no prolonged ventilation   No tube feeds if cognitively unaware   Social Determinants of Health   Financial Resource Strain:   . Difficulty of Paying Living Expenses:   Food  Insecurity:   . Worried About Charity fundraiser in the Last Year:   . Arboriculturist in the Last Year:   Transportation Needs:   . Film/video editor (Medical):   Marland Kitchen Lack of Transportation (Non-Medical):   Physical Activity:   . Days of Exercise per Week:   . Minutes of Exercise per Session:   Stress:   . Feeling of Stress :   Social Connections:   . Frequency of Communication with Friends and Family:   . Frequency of Social Gatherings with Friends and Family:   . Attends Religious Services:   . Active Member of Clubs or Organizations:   . Attends Archivist Meetings:   Marland Kitchen Marital Status:   Intimate Partner Violence:   . Fear of Current or Ex-Partner:   . Emotionally Abused:   Marland Kitchen Physically Abused:   . Sexually Abused:     Review of Systems: See HPI, otherwise negative ROS  Physical Exam: BP 129/78   Pulse 73   Temp (!) 97.5 F (36.4 C) (Temporal)   Resp 20   Ht 4\' 11"  (1.499 m)   Wt 42.6 kg   SpO2 99%   BMI 18.99 kg/m  General:   Alert,  pleasant and cooperative in NAD Head:  Normocephalic and atraumatic. Neck:  Supple; no masses or thyromegaly. Lungs:  Clear throughout to auscultation.    Heart:  Regular rate and rhythm. Abdomen:  Soft, nontender and nondistended. Normal bowel sounds, without guarding, and without rebound.   Neurologic:  Alert and  oriented x4;  grossly normal neurologically.  Impression/Plan: Molly Peters is here for an endoscopy to be performed for upper abdominal pain, weight loss, diarrhea  Risks, benefits, limitations, and alternatives regarding  endoscopy have been reviewed with the patient.  Questions have been answered.  All parties agreeable.   Sherri Sear, MD  03/18/2020, 9:48 AM

## 2020-03-18 NOTE — Telephone Encounter (Signed)
Message left notifying patient that it is time to schedule the low dose lung cancer screening CT scan.  Instructed patient to return call to Shawn Perkins at [phone] to verify information prior to CT scan being scheduled.    

## 2020-03-18 NOTE — Anesthesia Preprocedure Evaluation (Signed)
Anesthesia Evaluation  Patient identified by MRN, date of birth, ID band Patient awake    Reviewed: Allergy & Precautions, NPO status , Patient's Chart, lab work & pertinent test results  History of Anesthesia Complications (+) Family history of anesthesia reactionNegative for: history of anesthetic complications (mother and sister with PONV)  Airway Mallampati: II       Dental  (+) Lower Dentures   Pulmonary neg sleep apnea, COPD,  COPD inhaler, Current Smoker and Patient abstained from smoking.,           Cardiovascular hypertension, Pt. on medications (-) Past MI and (-) CHF (-) dysrhythmias + Valvular Problems/Murmurs (? aortic insufficiency) MVP      Neuro/Psych Seizures - (none in last 10 years), Well Controlled,  Anxiety Depression    GI/Hepatic Neg liver ROS, PUD, GERD  Medicated and Controlled,  Endo/Other  neg diabetes  Renal/GU negative Renal ROS     Musculoskeletal   Abdominal   Peds  Hematology  (+) anemia ,   Anesthesia Other Findings   Reproductive/Obstetrics                             Anesthesia Physical Anesthesia Plan  ASA: III  Anesthesia Plan: General   Post-op Pain Management:    Induction: Intravenous  PONV Risk Score and Plan: 2 and TIVA and Propofol infusion  Airway Management Planned: Nasal Cannula  Additional Equipment:   Intra-op Plan:   Post-operative Plan:   Informed Consent: I have reviewed the patients History and Physical, chart, labs and discussed the procedure including the risks, benefits and alternatives for the proposed anesthesia with the patient or authorized representative who has indicated his/her understanding and acceptance.       Plan Discussed with:   Anesthesia Plan Comments:         Anesthesia Quick Evaluation

## 2020-03-18 NOTE — Op Note (Signed)
[facility] Gastroenterology Patient Name: Molly Peters Procedure Date: 03/18/2020 9:38 AM MRN: [id number] Account #: [id number] Date of Birth: 1961-01-20 Admit Type: Outpatient Age: 59 Room: [facility] ENDO ROOM 1 Gender: Female Note Status: Finalized Procedure:             Upper GI endoscopy Indications:           Epigastric abdominal pain, , Diarrhea, Weight loss,                         h/o NSAID use Providers:             Lin Landsman MD, MD Medicines:             Monitored Anesthesia Care Complications:         No immediate complications. Estimated blood loss: None. Procedure:             Pre-Anesthesia Assessment:                        - Prior to the procedure, a History and Physical was                         performed, and patient medications and allergies were                         reviewed. The patient is competent. The risks and                         benefits of the procedure and the sedation options and                         risks were discussed with the patient. All questions                         were answered and informed consent was obtained.                         Patient identification and proposed procedure were                         verified by the physician, the nurse, the                         anesthesiologist, the anesthetist and the technician                         in the pre-procedure area in the procedure room in the                         endoscopy suite. Mental Status Examination: alert and                         oriented. Airway Examination: normal oropharyngeal                         airway and neck mobility. Respiratory Examination:                         clear to auscultation. CV Examination:  normal.                         Prophylactic Antibiotics: The patient does not require                         prophylactic antibiotics. Prior Anticoagulants: The                         patient has taken no previous  anticoagulant or                         antiplatelet agents. ASA Grade Assessment: III - A                         patient with severe systemic disease. After reviewing                         the risks and benefits, the patient was deemed in                         satisfactory condition to undergo the procedure. The                         anesthesia plan was to use monitored anesthesia care                         (MAC). Immediately prior to administration of                         medications, the patient was re-assessed for adequacy                         to receive sedatives. The heart rate, respiratory                         rate, oxygen saturations, blood pressure, adequacy of                         pulmonary ventilation, and response to care were                         monitored throughout the procedure. The physical                         status of the patient was re-assessed after the                         procedure.                        After obtaining informed consent, the endoscope was                         passed under direct vision. Throughout the procedure,                         the patient's blood pressure, pulse, and oxygen  saturations were monitored continuously. The Endoscope                         was introduced through the mouth, and advanced to the                         second part of duodenum. The upper GI endoscopy was                         accomplished without difficulty. The patient tolerated                         the procedure well. Findings:      The duodenal bulb and second portion of the duodenum were normal.       Biopsies for histology were taken with a cold forceps for evaluation of       celiac disease.      Many non-bleeding superficial gastric ulcers with a clean ulcer base       (Forrest Class III) were found in the gastric antrum. The largest lesion       was 6 mm in largest dimension.      Diffuse  mildly erythematous mucosa without bleeding was found in the       gastric body and in the gastric antrum. Biopsies were taken with a cold       forceps for Helicobacter pylori testing.      The cardia and gastric fundus were normal on retroflexion.      The gastroesophageal junction and examined esophagus were normal. Impression:            - Normal duodenal bulb and second portion of the                         duodenum. Biopsied.                        - Non-bleeding gastric ulcers with a clean ulcer base                         (Forrest Class III).                        - Erythematous mucosa in the gastric body and antrum.                         Biopsied.                        - Normal gastroesophageal junction and esophagus. Recommendation:        - Await pathology results.                        - Discharge patient to home (with escort).                        - Resume previous diet today.                        - Continue present medications.                        -  Avoid NSAID use                        - No ibuprofen, naproxen, or other non-steroidal                         anti-inflammatory drugs.                        - Follow up with Dr Bonna Gains                        - Use Prilosec (omeprazole) 40 mg PO BID for 3 months. Procedure Code(s):     --- Professional ---                        [phone], Esophagogastroduodenoscopy, flexible,                         transoral; with biopsy, single or multiple Diagnosis Code(s):     --- Professional ---                        K25.9, Gastric ulcer, unspecified as acute or chronic,                         without hemorrhage or perforation                        K31.89, Other diseases of stomach and duodenum                        R10.13, Epigastric pain                        R19.7, Diarrhea, unspecified                        R63.4, Abnormal weight loss CPT copyright 2019 American Medical Association. All rights reserved. The  codes documented in this report are preliminary and upon coder review may  be revised to meet current compliance requirements. Dr. Ulyess Mort Lin Landsman MD, MD 03/18/2020 10:01:57 AM This report has been signed electronically. Number of Addenda: 0 Note Initiated On: 03/18/2020 9:38 AM Estimated Blood Loss:  Estimated blood loss: none.      [facility]

## 2020-03-18 NOTE — Transfer of Care (Signed)
Immediate Anesthesia Transfer of Care Note  Patient: Molly Peters  Procedure(s) Performed: ESOPHAGOGASTRODUODENOSCOPY (EGD) WITH PROPOFOL (N/A )  Patient Location: Endoscopy Unit  Anesthesia Type:General  Level of Consciousness: awake  Airway & Oxygen Therapy: Patient Spontanous Breathing and Patient connected to nasal cannula oxygen  Post-op Assessment: Report given to RN and Post -op Vital signs reviewed and stable  Post vital signs: Reviewed  Last Vitals:  Vitals Value Taken Time  BP 103/58 03/18/20 1005  Temp 36 C 03/18/20 1005  Pulse 71 03/18/20 1005  Resp 17 03/18/20 1005  SpO2 93 % 03/18/20 1005  Vitals shown include unvalidated device data.  Last Pain:  Vitals:   03/18/20 1004  TempSrc: Temporal  PainSc: Asleep         Complications: No apparent anesthesia complications

## 2020-03-18 NOTE — Anesthesia Postprocedure Evaluation (Signed)
Anesthesia Post Note  Patient: Molly Peters  Procedure(s) Performed: ESOPHAGOGASTRODUODENOSCOPY (EGD) WITH PROPOFOL (N/A )  Patient location during evaluation: Endoscopy Anesthesia Type: General Level of consciousness: awake and alert Pain management: pain level controlled Vital Signs Assessment: post-procedure vital signs reviewed and stable Respiratory status: spontaneous breathing and respiratory function stable Cardiovascular status: stable Anesthetic complications: no     Last Vitals:  Vitals:   03/18/20 1004 03/18/20 1005  BP: (!) 103/58 (!) 103/58  Pulse: 74 68  Resp: 17 14  Temp: (!) 36 C (!) 36 C  SpO2: 92% 94%    Last Pain:  Vitals:   03/18/20 1004  TempSrc: Temporal  PainSc: Asleep                 Doyne Ellinger K

## 2020-03-19 ENCOUNTER — Encounter: Payer: Self-pay | Admitting: *Deleted

## 2020-03-19 LAB — SURGICAL PATHOLOGY

## 2020-03-20 ENCOUNTER — Other Ambulatory Visit: Payer: Self-pay | Admitting: Internal Medicine

## 2020-03-21 ENCOUNTER — Telehealth: Payer: Self-pay

## 2020-03-21 LAB — VITAMIN E
Vitamin E (Alpha Tocopherol): 7.5 mg/L — ABNORMAL LOW (ref 9.0–29.0)
Vitamin E(Gamma Tocopherol): 2.6 mg/L (ref 0.5–4.9)

## 2020-03-21 LAB — VITAMIN D 1,25 DIHYDROXY
Vitamin D 1, 25 (OH)2 Total: 38 pg/mL
Vitamin D2 1, 25 (OH)2: <10 pg/mL
Vitamin D3 1, 25 (OH)2: 38 pg/mL

## 2020-03-21 LAB — VITAMIN A: Vitamin A: 56.3 ug/dL (ref 22.0–69.5)

## 2020-03-21 LAB — VITAMIN K1, SERUM: VITAMIN K1: 0.86 ng/mL (ref 0.10–2.20)

## 2020-03-21 LAB — PANCREATIC ELASTASE, FECAL: Pancreatic Elastase, Fecal: 173 ug Elast./g — ABNORMAL LOW (ref >200)

## 2020-03-21 MED ORDER — ZENPEP 40000-126000 UNITS PO CPEP: ORAL_CAPSULE | ORAL | 1 refills | Status: DC

## 2020-03-21 NOTE — Telephone Encounter (Signed)
-----   Message from Lin Landsman, MD sent at 03/21/2020  3:03 PM EDT ----- Please inform patient about low vitamin E levels which is secondary to pancreatic insufficiency  Recommend vitamin E supplement 100 to 200 units once a day for 1 month, this is over-the-counter medication  Rohini Vanga

## 2020-03-21 NOTE — Telephone Encounter (Signed)
Called and left a message for call back. Sent medication to the pharmacy   

## 2020-03-21 NOTE — Telephone Encounter (Signed)
Last filled 02-07-20 #90 Last OV 06-20-19 Next OV 06-25-20 [facility]

## 2020-03-21 NOTE — Telephone Encounter (Signed)
-----   Message from Lin Landsman, MD sent at 03/21/2020  2:59 PM EDT ----- Informed patient via MyChart about new diagnosis of exocrine pancreatic insufficiency.  Please send in prescription for Zenpep 40,000 units 2 capsules with each meal and 1 with snack for 1 month.  She has appointment with Dr. Bonna Gains for follow-up in June, originally patient of Dr. Bonna Gains  Thank Lucienne Minks

## 2020-03-21 NOTE — Telephone Encounter (Signed)
Patient states she ready mychart message and verbalized understanding

## 2020-03-22 ENCOUNTER — Other Ambulatory Visit: Payer: Self-pay

## 2020-03-22 ENCOUNTER — Telehealth: Payer: Self-pay

## 2020-03-22 ENCOUNTER — Inpatient Hospital Stay: Payer: Medicare HMO

## 2020-03-22 ENCOUNTER — Inpatient Hospital Stay: Payer: Medicare HMO | Attending: Hematology and Oncology

## 2020-03-22 VITALS — BP 147/78 | HR 77 | Temp 96.9°F | Resp 18

## 2020-03-22 DIAGNOSIS — E538 Deficiency of other specified B group vitamins: Secondary | ICD-10-CM | POA: Diagnosis not present

## 2020-03-22 DIAGNOSIS — D500 Iron deficiency anemia secondary to blood loss (chronic): Secondary | ICD-10-CM

## 2020-03-22 DIAGNOSIS — D509 Iron deficiency anemia, unspecified: Secondary | ICD-10-CM | POA: Insufficient documentation

## 2020-03-22 MED ORDER — CYANOCOBALAMIN 1000 MCG/ML IJ SOLN
1000.0000 ug | Freq: Once | INTRAMUSCULAR | Status: AC
  Administered 2020-03-22: 1000 ug via INTRAMUSCULAR

## 2020-03-22 NOTE — Telephone Encounter (Signed)
Patient verbalized understanding of results  

## 2020-03-22 NOTE — Patient Instructions (Signed)

## 2020-03-22 NOTE — Telephone Encounter (Signed)
the patient was in office today for a weight check / weight today 43.15 kg / 95.13 Lbs. The patient was understanding and agreeable.

## 2020-03-28 ENCOUNTER — Inpatient Hospital Stay: Payer: Medicare HMO

## 2020-03-28 NOTE — Progress Notes (Signed)
Nutrition  Patient did not show up for nutrition follow-up visit today as scheduled.  Sent message to scheduling to offer another appointment. Also sent message to provider.   Agustina Witzke B. Zenia Resides, [place], [place] Registered Dietitian [phone] (pager)

## 2020-04-02 ENCOUNTER — Ambulatory Visit: Payer: Medicare HMO | Admitting: Gastroenterology

## 2020-04-15 ENCOUNTER — Encounter: Payer: Self-pay | Admitting: Gastroenterology

## 2020-04-15 ENCOUNTER — Other Ambulatory Visit: Payer: Self-pay

## 2020-04-15 ENCOUNTER — Ambulatory Visit (INDEPENDENT_AMBULATORY_CARE_PROVIDER_SITE_OTHER): Payer: Medicare HMO | Admitting: Gastroenterology

## 2020-04-15 VITALS — BP 153/95 | HR 85 | Temp 98.0°F | Wt 92.4 lb

## 2020-04-15 DIAGNOSIS — Z8711 Personal history of peptic ulcer disease: Secondary | ICD-10-CM

## 2020-04-15 DIAGNOSIS — H40153 Residual stage of open-angle glaucoma, bilateral: Secondary | ICD-10-CM | POA: Diagnosis not present

## 2020-04-15 DIAGNOSIS — Z8719 Personal history of other diseases of the digestive system: Secondary | ICD-10-CM | POA: Diagnosis not present

## 2020-04-15 DIAGNOSIS — K8681 Exocrine pancreatic insufficiency: Secondary | ICD-10-CM | POA: Diagnosis not present

## 2020-04-15 DIAGNOSIS — D500 Iron deficiency anemia secondary to blood loss (chronic): Secondary | ICD-10-CM

## 2020-04-15 NOTE — Progress Notes (Signed)
[name], MD [address]  [place], [place] [address]  Main: [phone]  Fax: [phone]   Primary Care Physician: Venia Carbon, MD   Chief Complaint  Patient presents with  . Gastric ulcer    Patient stated that she continues to have abdominal pain radiating to her back.    HPI: Molly Peters is a 59 y.o. female here for follow-up of gastric ulcers and exocrine pancreatic insufficiency.  Recent fecal elastase was abnormal.  Zenpep was prescribed by Dr. Marius Ditch.  Patient is taking 40,000 unit capsules, 1 capsule with meals.  Therefore, she is taking about 1000 units/kg dosing.  With this, she reports improved diarrhea, but continues to have 3-4 loose bowel movements a day.  Also reports abdominal discomfort.  Current Outpatient Medications  Medication Sig Dispense Refill  . albuterol (VENTOLIN HFA) 108 (90 Base) MCG/ACT inhaler Inhale 2 puffs into the lungs every 6 (six) hours as needed for wheezing or shortness of breath.    . ALPRAZolam (XANAX) 1 MG tablet TAKE 1/2 TO 1 TABLET BY MOUTH 3 TIMES DAILY AS NEEDED ANXIETY 90 tablet 0  . lamoTRIgine (LAMICTAL) 100 MG tablet TAKE 1 TABLET BY MOUTH TWICE DAILY 180 tablet 3  . latanoprost (XALATAN) 0.005 % ophthalmic solution     . losartan-hydrochlorothiazide (HYZAAR) 100-12.5 MG tablet Take 1 tablet by mouth daily. 90 tablet 3  . omeprazole (PRILOSEC) 40 MG capsule Take 1 capsule (40 mg total) by mouth 2 (two) times daily before a meal. 60 capsule 0  . ondansetron (ZOFRAN ODT) 4 MG disintegrating tablet Take 1 tablet (4 mg total) by mouth every 8 (eight) hours as needed for nausea or vomiting. 20 tablet 0  . Pancrelipase, Lip-Prot-Amyl, (ZENPEP) 40000-126000 units CPEP Take 2 capsules with the first bite of each meal and 1 capsule with the first bite of each snack 240 capsule 1  . PARoxetine (PAXIL) 20 MG tablet TAKE 1 TABLET BY MOUTH ONCE DAILY 90 tablet 3   No current facility-administered  medications for this visit.    Allergies as of 04/15/2020 - Review Complete 04/15/2020  Allergen Reaction Noted  . Carbamazepine Hives 08/26/2015  . Divalproex sodium Swelling and Other (See Comments) 01/01/2016  . Clonazepam Other (See Comments) 08/12/2005  . Diphenhydramine hcl Rash 08/26/2015  . Tiotropium bromide monohydrate Other (See Comments)   . Vicodin [hydrocodone-acetaminophen] Itching 11/15/2014    ROS:  General: Negative for anorexia, weight loss, fever, chills, fatigue, weakness. ENT: Negative for hoarseness, difficulty swallowing , nasal congestion. CV: Negative for chest pain, angina, palpitations, dyspnea on exertion, peripheral edema.  Respiratory: Negative for dyspnea at rest, dyspnea on exertion, cough, sputum, wheezing.  GI: See history of present illness. GU:  Negative for dysuria, hematuria, urinary incontinence, urinary frequency, nocturnal urination.  Endo: Negative for unusual weight change.    Physical Examination:   BP (!) 153/95   Pulse 85   Temp 98 F (36.7 C) (Oral)   Wt 92 lb 6.4 oz (41.9 kg)   BMI 18.66 kg/m   General: Well-nourished, well-developed in no acute distress.  Eyes: No icterus. Conjunctivae pink. Mouth: Oropharyngeal mucosa moist and pink , no lesions erythema or exudate. Neck: Supple, Trachea midline Abdomen: Bowel sounds are normal, nontender, nondistended, no hepatosplenomegaly or masses, no abdominal bruits or hernia , no rebound or guarding.   Extremities: No lower extremity edema. No clubbing or deformities. Neuro: Alert and oriented x 3.  Grossly intact. Skin: Warm and dry,  no jaundice.   Psych: Alert and cooperative, normal mood and affect.   Labs: CMP     Component Value Date/Time   NA 138 02/19/2020 0839   K 3.3 (L) 02/19/2020 0839   CL 103 02/19/2020 0839   CO2 24 02/19/2020 0839   GLUCOSE 102 (H) 02/19/2020 0839   BUN 16 02/19/2020 0839   CREATININE 1.00 02/19/2020 0839   CALCIUM 8.9 02/19/2020 0839    PROT 7.0 02/19/2020 0839   ALBUMIN 4.1 02/19/2020 0839   AST 25 02/19/2020 0839   ALT 10 02/19/2020 0839   ALKPHOS 53 02/19/2020 0839   BILITOT 0.5 02/19/2020 0839   GFRNONAA >60 02/19/2020 0839   GFRAA >60 02/19/2020 0839   Lab Results  Component Value Date   WBC 7.6 01/24/2020   HGB 15.0 01/24/2020   HCT 42.6 01/24/2020   MCV 92.6 01/24/2020   PLT 343 01/24/2020    Imaging Studies: No results found.  Assessment and Plan:   MARIBETH JILES is a 59 y.o. y/o female here for follow-up of gastric ulcers and exocrine pancreatic insufficiency  We will repeat fecal elastase to assess for improvement or for changing or increasing Zenpep  Etiology of exocrine pancreatic insufficiency is unclear  We will check for celiac disease and obtain gastrin levels to assess for Zollinger-Ellison syndrome given her history of gastric ulcers.  If these are unrevealing, obtain secretin stimulation test  Continue PPI for gastric ulcers  Avoid NSAIDs    Dr [name]

## 2020-04-15 NOTE — Patient Instructions (Signed)
Please go to any [facility] location and have your labs drawn and we will call you in case your prescription needs to be changed depending on results.

## 2020-04-16 LAB — GASTRIN: Gastrin: 857 pg/mL — ABNORMAL HIGH (ref 0–115)

## 2020-04-16 LAB — TISSUE TRANSGLUTAMINASE, IGA: Transglutaminase IgA: <2 U/mL (ref 0–3)

## 2020-04-16 LAB — IGA: IgA/Immunoglobulin A, Serum: 137 mg/dL (ref 87–352)

## 2020-04-17 NOTE — Progress Notes (Signed)
[facility]  [address], [address] [place], [place] [address] Phone: [phone]  Fax: [phone]   Clinic Day:  04/18/2020  Referring physician: Venia Carbon, MD  Chief Complaint: Molly Peters is a 59 y.o. female with iron deficiency anemia, B12 deficiency, andunexplained weight loss who is seen for 2 month assessment.   HPI: The patient was last seen in the hematology clinic on 02/20/2020. At that time, she felt "tired". She noted black stool with mucus; she described one episode of blood in her stool.  CBC was normal.  Ferritin was 15. Potassium was 3.3. Free T4 was 0.67.   Patient received a B12 injection on 02/23/2020 and 03/22/2020.   She saw Dr. Marius Ditch on 03/12/2020. She had nausea on and off during the day. She had sharp abdominal pain that would come and go. She had some abdominal swelling. She had alternating constipation and diarrhea. She felt bloated throughout the day. She reported intermittent black stool.  EGD was recommended.   Upper endoscopy on 03/18/2020 revealed a normal duodenal bulb and second portion of the duodenum. There was non-bleeding gastric ulcers with a clean ulcer base (Forrest Class III).  There was erythematous mucosa in the gastric body and antrum. There was normal gastroesophageal junction and esophagus.  Pathology on 03/18/2020 noted duodenal mucosa with no significant pathologic alteration. There was gastric antral and oxyntic mucosa with mild reactive changes. There were no features of celiac disease, dysplasia, malignancy, active inflammation or H. pylori.   Patient was a no show on 03/28/2020 for appointment with Jennet Maduro, RD.  She saw Dr. Bonna Gains on 04/15/2020 for gastric ulcers and exocrine pancreatic insufficiency. She was taking Zenpep 40,000 unit capsules with meals.  With this, she reported improved diarrhea, but continued to have 3-4 loose bowel movements a day and abdominal discomfort.  Etiology of  exocrine pancreatic insufficiency was unclear. Repeat fecal elastase was planned to assess improvement or change in Zenpep; fecal pancreatic elastase was 173 (> 200) on 03/18/2020.  Celiac disease testing and gastrin levels to assess for Zollinger-Ellison syndrome were planned.  PPI continued.  Tissue transglutaminase IgA was negative.  IgA was 137.  Gastrin level was 857 (0-115).  During the interim, she has felt tired. She has diarrhea right after meals. She is taking Zenpep with meals. Her weight is down 4 more pounds. She is not on any diuretic. Potassium is low (3.1).  I advised the patient to take oral potassium for a few days. She will speak with Jennet Maduro, RD tomorrow. She is drinking Ensure and Boost. After drinking nutritional drinks she has diarrhea. She agreed to taking some Ensure and Boost samples home today. Patient is up to date on mammograms.    Past Medical History:  Diagnosis Date  . Anxiety   . Cervical cancer ([place]) 1980's  . Chronic back pain 11/12/2009   Qualifier: Diagnosis of  By: Maxie Better FNP, Rosalita Levan   . COPD (chronic obstructive pulmonary disease) (HCC)    states SOB with ADLs; no O2 use; able to speak in complete sentences without SOB(11/15/2014)  . Depression   . Difficulty swallowing pills    s/p cervical fusion  . Family history of adverse reaction to anesthesia    pt's mother and sister have hx. of post-op N/V  . Fibromyalgia   . GERD (gastroesophageal reflux disease)   . Heart murmur   . History of gastric ulcer   . Hypertension    states under control with  med., has been on med. x 1-2 yr.  . IBS (irritable bowel syndrome)   . Internal hemorrhoid    states has had intermittent bright red bleeding with BM (11/15/2014)  . Intraductal papilloma of breast, right 08/08/2018  . Limited joint range of motion    neck - s/p cervical fusion  . Localized primary osteoarthritis of right shoulder region 11/23/2014  . Osteoarthritis of left shoulder 07/05/2015  .  Osteoarthritis of right shoulder 11/2014  . Pneumonia   . Seizures ([place])    last seizure 2010  . Short-term memory loss   . TMJ syndrome   . Valvular heart disease    Initial workup a number of years ago at [facility].  Echo (10/2009) showed EF 60-65%,      normal LV size, moderate aortic insufficiency with a trileaflet aortic valve and normal aortic root size.  Mild      mitral regurgitation.   . Wears dentures    lower    Past Surgical History:  Procedure Laterality Date  . ABDOMINAL HYSTERECTOMY     partial  . ANTERIOR CERVICAL DECOMP/DISCECTOMY FUSION  02/06/2011   C4-5, C5-6, C6-7  . BREAST BIOPSY Bilateral 10+ yrs ago   neg  . BREAST BIOPSY Right 08/03/2018   2 areas bx, -neg  . BREAST LUMPECTOMY Bilateral 1989   x 3 - benign  . BREAST LUMPECTOMY Right 08/24/2018   Procedure: BREAST LUMPECTOMY WITH ULTRASOUND IN OR;  Surgeon: Vickie Epley, MD;  Location: [facility] ORS;  Service: General;  Laterality: Right;  . CARDIAC CATHETERIZATION  1995  . CESAREAN SECTION     x 2  . COLONOSCOPY  09/28/2008  . COLONOSCOPY N/A 06/15/2018   Procedure: COLONOSCOPY;  Surgeon: Virgel Manifold, MD;  Location: [facility] ENDOSCOPY;  Service: Endoscopy;  Laterality: N/A;  . COLONOSCOPY WITH PROPOFOL N/A 05/04/2019   Procedure: COLONOSCOPY WITH PROPOFOL;  Surgeon: Virgel Manifold, MD;  Location: [facility] ENDOSCOPY;  Service: Endoscopy;  Laterality: N/A;  . ESOPHAGOGASTRODUODENOSCOPY  09/28/2008  . ESOPHAGOGASTRODUODENOSCOPY N/A 06/14/2018   Procedure: ESOPHAGOGASTRODUODENOSCOPY (EGD);  Surgeon: Virgel Manifold, MD;  Location[facility] ENDOSCOPY;  Service: Endoscopy;  Laterality: N/A;  . ESOPHAGOGASTRODUODENOSCOPY (EGD) WITH PROPOFOL N/A 05/04/2019   Procedure: ESOPHAGOGASTRODUODENOSCOPY (EGD) WITH PROPOFOL;  Surgeon: Virgel Manifold, MD;  Location: [facility] ENDOSCOPY;  Service: Endoscopy;  Laterality: N/A;  . ESOPHAGOGASTRODUODENOSCOPY (EGD) WITH PROPOFOL N/A 03/18/2020   Procedure:  ESOPHAGOGASTRODUODENOSCOPY (EGD) WITH PROPOFOL;  Surgeon: Lin Landsman, MD;  Location: [facility] ENDOSCOPY;  Service: Gastroenterology;  Laterality: N/A;  . HARDWARE REMOVAL  11/17/2006   L5-S1  . LAMINECTOMY WITH POSTERIOR LATERAL ARTHRODESIS LEVEL 3  12/06/2009   with synovial cyst resection L3-4 bilat.  Marland Kitchen LAMINECTOMY WITH POSTERIOR LATERAL ARTHRODESIS LEVEL 3  11/17/2006   L4-5  . NM MYOVIEW LTD     Lexiscan myoview (10/2009): EF 77%, normal wall motion, normal perfusion.   Marland Kitchen SHOULDER ARTHROSCOPY WITH DEBRIDEMENT AND BICEP TENDON REPAIR Right 04/13/2014   Procedure: RIGHT SHOULDER ARTHROSCOPY WITH DEBRIDEMENT EXTENSIVE;  Surgeon: Johnny Bridge, MD;  Location: [place];  Service: Orthopedics;  Laterality: Right;  . TOTAL SHOULDER ARTHROPLASTY Right 11/23/2014   Procedure: TOTAL RIGHT SHOULDER ARTHROPLASTY;  Surgeon: Johnny Bridge, MD;  Location: [place];  Service: Orthopedics;  Laterality: Right;  . TOTAL SHOULDER ARTHROPLASTY Left 07/05/2015   Procedure: LEFT TOTAL SHOULDER REPLACEMENT;  Surgeon: Marchia Bond, MD;  Location: [place];  Service: Orthopedics;  Laterality: Left;  Family History  Problem Relation Age of Onset  . Cervical cancer Mother   . Anesthesia problems Mother        post-op N/V  . Rheum arthritis Mother   . Anxiety disorder Mother   . Cancer Father        colon cancer  . Migraines Sister   . Cervical cancer Sister   . Anesthesia problems Sister        post-op N/V  . Migraines Brother   . Asthma Daughter   . Cerebral palsy Daughter        age 51  . Coronary artery disease Maternal Grandmother   . Hypertension Maternal Grandmother   . Diabetes Maternal Grandmother   . Coronary artery disease Paternal Grandmother   . Hypertension Paternal Grandmother   . Diabetes Paternal Grandmother   . Breast cancer Maternal Aunt   . Breast cancer Maternal Aunt   . Breast cancer Maternal Aunt     Social History:   reports that she has been smoking cigarettes. She has a 42.75 pack-year smoking history. She has never used smokeless tobacco. She reports current alcohol use. She reports current drug use. Drug: Marijuana. She is currently smoking half a pack a day.She drinks "a hair" every now and then. Patient has been on disability in 2005 for "ruptured back". She is a former [employer]. The patient is alone today.  Allergies:  Allergies  Allergen Reactions  . Carbamazepine Hives  . Divalproex Sodium Swelling and Other (See Comments)    HAIR FALLS OUT  . Clonazepam Other (See Comments)    HALLUCINATIONS  . Diphenhydramine Hcl Rash  . Tiotropium Bromide Monohydrate Other (See Comments)    CREATES YEAST IN THROAT  . Vicodin [Hydrocodone-Acetaminophen] Itching    Current Medications: Current Outpatient Medications  Medication Sig Dispense Refill  . albuterol (VENTOLIN HFA) 108 (90 Base) MCG/ACT inhaler Inhale 2 puffs into the lungs every 6 (six) hours as needed for wheezing or shortness of breath.    . ALPRAZolam (XANAX) 1 MG tablet TAKE 1/2 TO 1 TABLET BY MOUTH 3 TIMES DAILY AS NEEDED ANXIETY 90 tablet 0  . lamoTRIgine (LAMICTAL) 100 MG tablet TAKE 1 TABLET BY MOUTH TWICE DAILY 180 tablet 3  . latanoprost (XALATAN) 0.005 % ophthalmic solution     . losartan-hydrochlorothiazide (HYZAAR) 100-12.5 MG tablet Take 1 tablet by mouth daily. 90 tablet 3  . omeprazole (PRILOSEC) 40 MG capsule Take 1 capsule (40 mg total) by mouth 2 (two) times daily before a meal. 60 capsule 0  . ondansetron (ZOFRAN ODT) 4 MG disintegrating tablet Take 1 tablet (4 mg total) by mouth every 8 (eight) hours as needed for nausea or vomiting. 20 tablet 0  . Pancrelipase, Lip-Prot-Amyl, (ZENPEP) 40000-126000 units CPEP Take 2 capsules with the first bite of each meal and 1 capsule with the first bite of each snack 240 capsule 1  . PARoxetine (PAXIL) 20 MG tablet TAKE 1 TABLET BY MOUTH ONCE DAILY 90 tablet 3   No current  facility-administered medications for this visit.    Review of Systems  Constitutional: Positive for malaise/fatigue (no energy) and weight loss (4 lbs; unintentional; basline 105-108 lbs). Negative for chills, diaphoresis and fever.        Feeling tired. Sleeping/napping more.   HENT: Negative for congestion, ear discharge, ear pain, hearing loss, nosebleeds, sinus pain, sore throat and tinnitus.        Hoarse voice x 2 weeks. Post nasal drip. Sinus issues.   Eyes:  Negative for blurred vision.        Early glaucoma on eye drops.   Respiratory: Negative for cough, hemoptysis, sputum production and shortness of breath (at rest; worse).   Cardiovascular: Negative for chest pain, palpitations and leg swelling.  Gastrointestinal: Positive for diarrhea (after meals). Negative for abdominal pain, blood in stool, constipation (on and off), heartburn, melena, nausea (on Zofran) and vomiting.       No breakfast; sandwich at lunch; dinner is a larger meal. Snacking at night. Bloated stomach. Gassy some days. Drinking Ensure/Boost.  Genitourinary: Negative for dysuria, frequency, hematuria and urgency.  Musculoskeletal: Negative for back pain, joint pain (right shoulder; knees and hands), myalgias and neck pain (right side; pain increases with movement).  Skin: Negative for itching and rash.  Neurological: Negative for dizziness, tingling, sensory change, weakness and headaches.       Change in thinking secondary to feeling tired all the time.   Endo/Heme/Allergies: Does not bruise/bleed easily.  Psychiatric/Behavioral: Negative for depression and memory loss. The patient is not nervous/anxious and does not have insomnia.   All other systems reviewed and are negative.   Performance status (ECOG): 1-2  Vitals Blood pressure (!) 165/89, pulse 70, temperature (!) 97 F (36.1 C), temperature source Tympanic, weight 90 lb 15 oz (41.3 kg), SpO2 96 %.   Physical Exam Vitals and nursing note reviewed.    Constitutional:      General: She is not in acute distress.    Appearance: She is well-developed and well-nourished. She is not diaphoretic.     Comments: Thin woman sitting comfortably in the exam room in no acute distress.   HENT:     Head: Normocephalic and atraumatic.     Mouth/Throat:     Mouth: Oropharynx is clear and moist.     Pharynx: No oropharyngeal exudate.      Comments: Long gray hair.  Mask.  Eyes:     General: No scleral icterus.    Extraocular Movements: EOM normal.     Conjunctiva/sclera: Conjunctivae normal.     Pupils: Pupils are equal, round, and reactive to light.     Comments: Glasses.  Blue eyes.   Cardiovascular:     Rate and Rhythm: Normal rate and regular rhythm.     Heart sounds: Normal heart sounds. No murmur heard.   Pulmonary:     Effort: Pulmonary effort is normal. No respiratory distress.     Breath sounds: Normal breath sounds. No wheezing or rales.  Chest:     Chest wall: No tenderness.  Abdominal:     General: Bowel sounds are normal. There is no distension.     Palpations: Abdomen is soft. There is no mass.     Tenderness: There is no abdominal tenderness. There is no guarding or rebound.  Musculoskeletal:        General: No tenderness or edema. Normal range of motion.     Cervical back: Normal range of motion and neck supple.  Lymphadenopathy:     Head:     Right side of head: No preauricular, posterior auricular or occipital adenopathy.     Left side of head: No preauricular, posterior auricular or occipital adenopathy.     Cervical: No cervical adenopathy.     Upper Body:  No axillary adenopathy present.    Left upper body: No supraclavicular adenopathy.     Lower Body: No right inguinal adenopathy. No left inguinal adenopathy.  Skin:    General: Skin is warm  and dry.  Neurological:     Mental Status: She is alert and oriented to person, place, and time.  Psychiatric:        Mood and Affect: Mood and affect normal.         Behavior: Behavior normal.        Thought Content: Thought content normal.        Judgment: Judgment normal.    Appointment on 04/18/2020  Component Date Value Ref Range Status  . Sodium 04/18/2020 137  135 - 145 mmol/L Final  . Potassium 04/18/2020 3.1* 3.5 - 5.1 mmol/L Final  . Chloride 04/18/2020 102  98 - 111 mmol/L Final  . CO2 04/18/2020 25  22 - 32 mmol/L Final  . Glucose, Bld 04/18/2020 145* 70 - 99 mg/dL Final   Glucose reference range applies only to samples taken after fasting for at least 8 hours.  . BUN 04/18/2020 13  6 - 20 mg/dL Final  . Creatinine, Ser 04/18/2020 0.94  0.44 - 1.00 mg/dL Final  . Calcium 04/18/2020 9.3  8.9 - 10.3 mg/dL Final  . Total Protein 04/18/2020 7.7  6.5 - 8.1 g/dL Final  . Albumin 04/18/2020 4.3  3.5 - 5.0 g/dL Final  . AST 04/18/2020 23  15 - 41 U/L Final  . ALT 04/18/2020 14  0 - 44 U/L Final  . Alkaline Phosphatase 04/18/2020 73  38 - 126 U/L Final  . Total Bilirubin 04/18/2020 0.2* 0.3 - 1.2 mg/dL Final  . GFR calc non Af Amer 04/18/2020 >60  >60 mL/min Final  . GFR calc Af Amer 04/18/2020 >60  >60 mL/min Final  . Anion gap 04/18/2020 10  5 - 15 Final   Performed at [facility], [address] [place], [place] [address]  . WBC 04/18/2020 10.1  4.0 - 10.5 K/uL Final  . RBC 04/18/2020 4.59  3.87 - 5.11 MIL/uL Final  . Hemoglobin 04/18/2020 14.9  12.0 - 15.0 g/dL Final  . HCT 04/18/2020 42.1  36 - 46 % Final  . MCV 04/18/2020 91.7  80.0 - 100.0 fL Final  . MCH 04/18/2020 32.5  26.0 - 34.0 pg Final  . MCHC 04/18/2020 35.4  30.0 - 36.0 g/dL Final  . RDW 04/18/2020 13.0  11.5 - 15.5 % Final  . Platelets 04/18/2020 351  150 - 400 K/uL Final  . nRBC 04/18/2020 0.0  0.0 - 0.2 % Final  . Neutrophils Relative % 04/18/2020 56  % Final  . Neutro Abs 04/18/2020 5.6  1.7 - 7.7 K/uL Final  . Lymphocytes Relative 04/18/2020 33  % Final  . Lymphs Abs 04/18/2020 3.3  0.7 - 4.0 K/uL Final  . Monocytes Relative 04/18/2020 9  % Final  .  Monocytes Absolute 04/18/2020 0.9  0 - 1 K/uL Final  . Eosinophils Relative 04/18/2020 1  % Final  . Eosinophils Absolute 04/18/2020 0.1  0 - 0 K/uL Final  . Basophils Relative 04/18/2020 1  % Final  . Basophils Absolute 04/18/2020 0.1  0 - 0 K/uL Final  . Immature Granulocytes 04/18/2020 0  % Final  . Abs Immature Granulocytes 04/18/2020 0.04  0.00 - 0.07 K/uL Final   Performed at [facility], [address] [place], [place] [address]    Assessment:  Molly Peters is a 59 y.o. female withiron deficiency anemiasecondary to an upper GI bleed. She presented with intermittent melena and chest pain. Hemoglobin nadir was 6.9 on 06/14/2018.Ferritin was 9, iron saturation 11%  and TIBC 393 on 06/20/2018. She received 1 unit of PRBCs.  Work-up on 08/27/2019revealed a hematocrit of 31, hemoglobin 10.2, and MCV 70.8. Ferritin was 7. B12 was 165 (low). Folate was 6.3 (>5.9). TSH was 1.256.  Ferritinhas been followed: 9 on 06/20/2018, 7 on 07/05/2018, 5 on 07/19/2018, 81 on 09/12/2018, 12 on 10/24/2018, 71 on 02/07/2019, 51 on 05/09/2019, 32 on 07/28/2019, 29 on 08/28/2019, and 15 on 01/24/2020.   She received Venofer200 mg on 07/25/2018, weekly x 3 (08/15/2018 - 08/29/2018), weekly x 2 (01/10/2019 - 01/17/2019), and03/19/2021.  She has B12 deficiency. She began oral B12on 07/06/2018. B12 was 165 on 07/05/2018, 396 on 07/19/2018, 421 (after B12 injection) on 09/12/2018, and 246 on 10/24/2018 (on oral B12). Folatewas 6.3 on 07/05/2018 and 9.2 on 09/12/2018. She began monthly B12on 08/29/2018 (last03/19/2021).  EGDon 06/14/2018 revealed esophageal mucosal changes c/w short segment of Barrett's and a non-bleeding gastric ulcers. Pathology at the GE junction revealed squamocolumnar mucosa with moderate chronic inflammation. Gastric biopsy revealed no H pylori, dysplasia, or malignancy. EGDon 05/04/2019 revealed non-bleeding gastric ulcer with a clean ulcer base  (Forrest Class III)and friable gastric mucosa. Biopsies revealed antral mild reactive gastropathy with no H pylori, metaplasia, dysplasia or malignancy.  Colonoscopyon 06/15/2018 revealed a 5 mm polyp in the sigmoid colon (tubular adenoma). Colonoscopyon 05/04/2019 revealed three 4-5 mm polyps in the sigmoid colon (hyperplastic polyps) and diverticulosis in the sigmoid colon.   Chest CT angiogramon 06/22/2018 reveled no pulmonary embolism. There was underlying emphysematous changes. There were small LUL pulmonary nodular changes (largest 3 mm). There was no thoracic adenopathy.   CT angiogramof the abdomen and pelvison 09/30/2019revealed no evidence of abdominal aortic aneurysm or dissection. No findings to suggest active GI bleeding on CT. There was non-vascular wall thickening involving the transverse colon extending to the splenic flexure, suspicious for infectious/inflammatory colitis. There was associated trace pelvic ascites. There was no pneumatosis or free air. C diff and GI panel by PCR was negative. She was treated with Flagyl and Augmentin.  Chest, abdomen and pelvis CT on 04/12/02021 revealed no evidence of malignancy within the chest, abdomen or pelvis to account for patient's weight loss.  There were no acute findings.  There were a few tiny bilateral pulmonary nodules none greater than 3 mm in size without significant change from the prior exam.  There was mild uniform thickening of the gastric antrum which may be secondary to peristalsis/spasm, although gastritis/antritis was possible.  BILATERAL mammogramon 07/27/2018 revealed 2 indeterminate masses in the RIGHT breast at the 2:30 and 3 o'clock positions. Follow up breast ultrasoundsshows a 6 x 4 x 5 mm oval hypoechoic mass, 1 cm from the nipple, in the 2:30 position. In the 3 o'clock position, again 1 cm from the nipple, the was a 6 x 3 x 3 mm oval hypoechoic mass. LEFT breast ultrasound showed normal  fibroglandular tissue at the 10 o'clock position. Ultrasound guided needle biopsy with clip placement on 08/03/2018. Pathology revealed revealed a dilated duct/cyst with sclerosis of the wall in the 2:30 lesion. Sample negative for atypia and malignancy. Lesion in the 3 o'clock position revealed an intraductal papilloma. Samplewasnegative for atypia and malignancy.  Right breast lumpectomy on 08/24/2018 revealed an intraductal papillomawith adjacent biopsy change. There was sclerotic changes suggestive of cyst/duct wall with adjacent biopsy change. There was duct ectasia and cysts in the tissue between the 2 biopsy sites. There was no atypia or malignancy.  Screening bilateral mammogramon 08/16/2019 revealed no evidence of malignancy.   Symptomatically,  she continues to lose weight.  She has diarrhea after eating despite ZenPep (pancreatic enzyme replacement).  Plan: 1.   Labs today:  CBC with diff, CMP, ferritin, iron studies. 2.Weight loss Current weight is 90 pounds. Patient's baseline weight is105-108 pounds. Patient has diarrhea after meals. Thyroid function is normal. Patient smokes.  Low dose chest CT 03/30/2019 revealed scattered small LLL nodules. CT angiogramof the abdomen and pelvison 09/30/2019revealedno masses.             Mammogram on 11/15/2018 revealed no evidence of malignancy. Follow-up with dietician. `           Samples of Ensure today. Consider testing for carcinoid (24 hour urine for 5HIAA).             Readdress imaging (PET) scan to r/o maligancy. 3.Iron deficiency anemia Hematocrit 42.1.Hemoglobin14.9.MCV91.7. Ferritin36 with an iron saturation of 12% and a TIBC of 322. She received Venofer x 1 on 01/26/2020. Continue to monitor. 4.B12 deficiency Patient receives B12 injections. Last B12was on  03/22/2020. B12 today and monthly x 6. Check folate annually. 5.Pulmonary nodules Low dose chest CT on 03/30/2019 revealedscattered small(up to 3.9 mm)bilateral pulmonary nodules in the left lower lobe. Chest CT on 02/19/2020 revealed no evidence of maligancy.  She has emphysema and a few tiny pulmonary nodules (none > 3 mm). Continue to monitor annually. 6. GI issues Patientnotes minimal rectal bleeding. She has chronic nausea. She is on Protonix and ondansetron. She has off and on diarrhea and constipation. She has had no improvement with pancreatic enzymes. Follow-up with Dr Bonna Gains. 7. Hypokalemia  Potassium 20 meq po x 3 days. 8.   PET scan in 2 weeks. 9.   RTC after PET scan for MD assessment and review of imaging.   I discussed the assessment and treatment plan with the patient.  The patient was provided an opportunity to ask questions and all were answered.  The patient agreed with the plan and demonstrated an understanding of the instructions.  The patient was advised to call back if the symptoms worsen or if the condition fails to improve as anticipated.  I provided 13 minutes of face-to-face time during this encounter and > 50% was spent counseling as documented under my assessment and plan. An additional 8-10 minutes were spent reviewing herchart (Epic and Care Everywhere) including notes, labs, and imaging studies.    Lequita Asal, MD, PhD    04/18/2020, 3:12 PM  I, Selena Batten, am acting as scribe for [employer]. Mike Gip, MD, PhD.  I, Aniela Caniglia C. Mike Gip, MD, have reviewed the above documentation for accuracy and completeness, and I agree with the above.

## 2020-04-18 ENCOUNTER — Encounter: Payer: Self-pay | Admitting: Hematology and Oncology

## 2020-04-18 ENCOUNTER — Inpatient Hospital Stay: Payer: Medicare HMO

## 2020-04-18 ENCOUNTER — Inpatient Hospital Stay (HOSPITAL_BASED_OUTPATIENT_CLINIC_OR_DEPARTMENT_OTHER): Payer: Medicare HMO | Admitting: Hematology and Oncology

## 2020-04-18 ENCOUNTER — Other Ambulatory Visit: Payer: Self-pay

## 2020-04-18 ENCOUNTER — Inpatient Hospital Stay: Payer: Medicare HMO | Attending: Hematology and Oncology

## 2020-04-18 VITALS — BP 165/89 | HR 70 | Temp 97.0°F | Wt 90.9 lb

## 2020-04-18 DIAGNOSIS — Z836 Family history of other diseases of the respiratory system: Secondary | ICD-10-CM | POA: Insufficient documentation

## 2020-04-18 DIAGNOSIS — R110 Nausea: Secondary | ICD-10-CM | POA: Insufficient documentation

## 2020-04-18 DIAGNOSIS — D509 Iron deficiency anemia, unspecified: Secondary | ICD-10-CM | POA: Diagnosis not present

## 2020-04-18 DIAGNOSIS — Z800 Family history of malignant neoplasm of digestive organs: Secondary | ICD-10-CM | POA: Diagnosis not present

## 2020-04-18 DIAGNOSIS — F17210 Nicotine dependence, cigarettes, uncomplicated: Secondary | ICD-10-CM | POA: Insufficient documentation

## 2020-04-18 DIAGNOSIS — Z8541 Personal history of malignant neoplasm of cervix uteri: Secondary | ICD-10-CM | POA: Diagnosis not present

## 2020-04-18 DIAGNOSIS — Z885 Allergy status to narcotic agent status: Secondary | ICD-10-CM | POA: Insufficient documentation

## 2020-04-18 DIAGNOSIS — Z8711 Personal history of peptic ulcer disease: Secondary | ICD-10-CM | POA: Diagnosis not present

## 2020-04-18 DIAGNOSIS — Z820 Family history of epilepsy and other diseases of the nervous system: Secondary | ICD-10-CM | POA: Diagnosis not present

## 2020-04-18 DIAGNOSIS — Z79899 Other long term (current) drug therapy: Secondary | ICD-10-CM | POA: Diagnosis not present

## 2020-04-18 DIAGNOSIS — Z8719 Personal history of other diseases of the digestive system: Secondary | ICD-10-CM | POA: Insufficient documentation

## 2020-04-18 DIAGNOSIS — Z8261 Family history of arthritis: Secondary | ICD-10-CM | POA: Diagnosis not present

## 2020-04-18 DIAGNOSIS — R079 Chest pain, unspecified: Secondary | ICD-10-CM | POA: Diagnosis not present

## 2020-04-18 DIAGNOSIS — R1900 Intra-abdominal and pelvic swelling, mass and lump, unspecified site: Secondary | ICD-10-CM | POA: Insufficient documentation

## 2020-04-18 DIAGNOSIS — K921 Melena: Secondary | ICD-10-CM | POA: Insufficient documentation

## 2020-04-18 DIAGNOSIS — K219 Gastro-esophageal reflux disease without esophagitis: Secondary | ICD-10-CM | POA: Diagnosis not present

## 2020-04-18 DIAGNOSIS — R918 Other nonspecific abnormal finding of lung field: Secondary | ICD-10-CM | POA: Diagnosis not present

## 2020-04-18 DIAGNOSIS — E876 Hypokalemia: Secondary | ICD-10-CM | POA: Diagnosis not present

## 2020-04-18 DIAGNOSIS — E538 Deficiency of other specified B group vitamins: Secondary | ICD-10-CM | POA: Insufficient documentation

## 2020-04-18 DIAGNOSIS — Z803 Family history of malignant neoplasm of breast: Secondary | ICD-10-CM | POA: Diagnosis not present

## 2020-04-18 DIAGNOSIS — Z818 Family history of other mental and behavioral disorders: Secondary | ICD-10-CM | POA: Diagnosis not present

## 2020-04-18 DIAGNOSIS — D500 Iron deficiency anemia secondary to blood loss (chronic): Secondary | ICD-10-CM | POA: Diagnosis not present

## 2020-04-18 DIAGNOSIS — R634 Abnormal weight loss: Secondary | ICD-10-CM

## 2020-04-18 DIAGNOSIS — Z8249 Family history of ischemic heart disease and other diseases of the circulatory system: Secondary | ICD-10-CM | POA: Diagnosis not present

## 2020-04-18 DIAGNOSIS — D125 Benign neoplasm of sigmoid colon: Secondary | ICD-10-CM | POA: Diagnosis not present

## 2020-04-18 DIAGNOSIS — Z833 Family history of diabetes mellitus: Secondary | ICD-10-CM | POA: Diagnosis not present

## 2020-04-18 LAB — CBC WITH DIFFERENTIAL/PLATELET
Abs Immature Granulocytes: 0.04 10*3/uL (ref 0.00–0.07)
Basophils Absolute: 0.1 10*3/uL (ref 0.0–0.1)
Basophils Relative: 1 %
Eosinophils Absolute: 0.1 10*3/uL (ref 0.0–0.5)
Eosinophils Relative: 1 %
HCT: 42.1 % (ref 36.0–46.0)
Hemoglobin: 14.9 g/dL (ref 12.0–15.0)
Immature Granulocytes: 0 %
Lymphocytes Relative: 33 %
Lymphs Abs: 3.3 10*3/uL (ref 0.7–4.0)
MCH: 32.5 pg (ref 26.0–34.0)
MCHC: 35.4 g/dL (ref 30.0–36.0)
MCV: 91.7 fL (ref 80.0–100.0)
Monocytes Absolute: 0.9 10*3/uL (ref 0.1–1.0)
Monocytes Relative: 9 %
Neutro Abs: 5.6 10*3/uL (ref 1.7–7.7)
Neutrophils Relative %: 56 %
Platelets: 351 10*3/uL (ref 150–400)
RBC: 4.59 MIL/uL (ref 3.87–5.11)
RDW: 13 % (ref 11.5–15.5)
WBC: 10.1 10*3/uL (ref 4.0–10.5)
nRBC: 0 % (ref 0.0–0.2)

## 2020-04-18 LAB — COMPREHENSIVE METABOLIC PANEL
ALT: 14 U/L (ref 0–44)
AST: 23 U/L (ref 15–41)
Albumin: 4.3 g/dL (ref 3.5–5.0)
Alkaline Phosphatase: 73 U/L (ref 38–126)
Anion gap: 10 (ref 5–15)
BUN: 13 mg/dL (ref 6–20)
CO2: 25 mmol/L (ref 22–32)
Calcium: 9.3 mg/dL (ref 8.9–10.3)
Chloride: 102 mmol/L (ref 98–111)
Creatinine, Ser: 0.94 mg/dL (ref 0.44–1.00)
GFR calc Af Amer: >60 mL/min (ref >60)
GFR calc non Af Amer: >60 mL/min (ref >60)
Glucose, Bld: 145 mg/dL — ABNORMAL HIGH (ref 70–99)
Potassium: 3.1 mmol/L — ABNORMAL LOW (ref 3.5–5.1)
Sodium: 137 mmol/L (ref 135–145)
Total Bilirubin: 0.2 mg/dL — ABNORMAL LOW (ref 0.3–1.2)
Total Protein: 7.7 g/dL (ref 6.5–8.1)

## 2020-04-18 LAB — IRON AND TIBC
Iron: 40 ug/dL (ref 28–170)
Saturation Ratios: 12 % (ref 10.4–31.8)
TIBC: 322 ug/dL (ref 250–450)
UIBC: 282 ug/dL

## 2020-04-18 LAB — FERRITIN: Ferritin: 36 ng/mL (ref 11–307)

## 2020-04-18 MED ORDER — CYANOCOBALAMIN 1000 MCG/ML IJ SOLN
1000.0000 ug | Freq: Once | INTRAMUSCULAR | Status: AC
  Administered 2020-04-18: 1000 ug via INTRAMUSCULAR
  Filled 2020-04-18: qty 1

## 2020-04-18 MED ORDER — POTASSIUM CHLORIDE CRYS ER 20 MEQ PO TBCR: 20.0000 meq | EXTENDED_RELEASE_TABLET | Freq: Every day | ORAL | 0 refills | Status: DC

## 2020-04-19 ENCOUNTER — Ambulatory Visit: Payer: Medicare HMO | Admitting: Hematology and Oncology

## 2020-04-19 ENCOUNTER — Inpatient Hospital Stay: Payer: Medicare HMO

## 2020-04-19 ENCOUNTER — Other Ambulatory Visit: Payer: Medicare HMO

## 2020-04-23 LAB — PANCREATIC ELASTASE, FECAL: Pancreatic Elastase, Fecal: 299 ug Elast./g (ref >200)

## 2020-04-25 ENCOUNTER — Telehealth: Payer: Self-pay

## 2020-04-25 DIAGNOSIS — E164 Increased secretion of gastrin: Secondary | ICD-10-CM

## 2020-04-25 NOTE — Telephone Encounter (Signed)
-----   Message from Virgel Manifold, MD sent at 04/23/2020 11:04 AM EDT ----- Herb Grays please let the patient know, her gastrin level is elevated. Please refer to Dr. Tasia Catchings for evaluation for gastrinoma

## 2020-04-25 NOTE — Telephone Encounter (Signed)
Called patient to let her know that her gastrin level was elevated and that Dr. Bonna Gains wanted her to be seen by Dr. Tasia Catchings to evaluate for gastrinoma. I told her that I would be sending the referral today and to be expecting a call from their office today or tomorrow. Patient agreed.

## 2020-04-29 ENCOUNTER — Inpatient Hospital Stay: Payer: Medicare HMO

## 2020-04-29 ENCOUNTER — Other Ambulatory Visit: Payer: Self-pay

## 2020-04-29 NOTE — Progress Notes (Signed)
Nutrition Follow-up:  Patient with iron deficiency anemia, b12 deficiency and unexplained weight loss.   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  Noted PET scan on 6/24.  Followed by Dr Mike Gip.  Met with patient in clinic this pm.  Patient reports appetite is fair.  Reports that this am had dime size stool, prior to this am last BM was 5 days ago (diarrhea).  Patient is taking zenpep.  Reports that she has been eating toast for breakfast, sandwich and chips for lunch. Dinner is meat and vegetables (pinto beans potatoes, green beans. She has switched to lactaid milk.  Liked the boost and orgain shakes that RD provided samples of last visit.  Drinking about 1 per day.    Medications: zenpep, KCL  Labs: reviewed  Anthropometrics:   Weight 92 lb 3 oz in clinic today decreased from 94 lb 12.8 oz on 4/13   NUTRITION DIAGNOSIS: Inadequate oral intake continues    INTERVENTION:  Encouraged patient to reach out to GI office and discuss lack of BM for 5 days. Patient verbalized that she would call.  Reviewed foods that could increase bloating, abdominal pain and foods to choose.   Encouraged small frequent meals. Encouraged patient to increase shakes from 1 per day to 2 if able to tolerate.  Await further plan following PET results and GI follow-up.  Patient has contact information    MONITORING, EVALUATION, GOAL: weight trends, intake   NEXT VISIT: July 19 phone f/u   Taira Knabe B. Zenia Resides, [place], [place] Registered Dietitian [phone] (pager)

## 2020-04-30 ENCOUNTER — Telehealth: Payer: Self-pay | Admitting: Hematology and Oncology

## 2020-04-30 NOTE — Telephone Encounter (Signed)
I called Molly Peters to inform her that her PET did not get authorized and had to cancelled it for now until we get authorization. Patient was in agreement.

## 2020-05-01 ENCOUNTER — Other Ambulatory Visit: Payer: Self-pay | Admitting: Internal Medicine

## 2020-05-01 ENCOUNTER — Other Ambulatory Visit: Payer: Self-pay | Admitting: Gastroenterology

## 2020-05-01 NOTE — Telephone Encounter (Signed)
Last filled 03-21-20 #90 Last OV 06-20-19 Next OV 06-25-20 [facility]

## 2020-05-02 ENCOUNTER — Encounter: Payer: Self-pay | Admitting: Hematology and Oncology

## 2020-05-02 ENCOUNTER — Ambulatory Visit: Payer: Medicare HMO

## 2020-05-02 DIAGNOSIS — E876 Hypokalemia: Secondary | ICD-10-CM | POA: Insufficient documentation

## 2020-05-02 NOTE — Progress Notes (Unsigned)
No new changes noted today. The patient Name and DOb has been verified by phone today.

## 2020-05-05 NOTE — Progress Notes (Incomplete)
[facility]  [address] [place], [place] [address] Phone: [phone]  Fax: [phone]   Clinic Day:  05/05/2020  Referring physician: Venia Carbon, MD  Chief Complaint: Molly Peters is a 59 y.o. female with iron deficiency anemia, B12 deficiency, andunexplained weight loss who is seen to review PET scan.  HPI: The patient was last seen in the hematology clinic on 04/18/2020. At that time, she continued to lose weight.  She had diarrhea after eating despite ZenPep (pancreatic enzyme replacement). Hematocrit was 42.1, hemoglobin 14.9, platelets 351,000, WBC 10,100. Potassium was 3.1.  Ferritin was 36 with an iron saturation of 12% and a TIBC of 322.  She received Venofer and Vitamin B12.  The patient spoke with Jennet Maduro, RD, on 04/29/2020. She reports not having a bowel movement in 5 days.  It was recommended she reach out to GI office. Follow up on 05/27/2020.  There is an order placed for a PET scan but it has not been scheduled yet.  During the interim, ***   Past Medical History:  Diagnosis Date  . Anxiety   . Cervical cancer ([place]) 1980's  . Chronic back pain 11/12/2009   Qualifier: Diagnosis of  By: Maxie Better FNP, Rosalita Levan   . COPD (chronic obstructive pulmonary disease) (HCC)    states SOB with ADLs; no O2 use; able to speak in complete sentences without SOB(11/15/2014)  . Depression   . Difficulty swallowing pills    s/p cervical fusion  . Family history of adverse reaction to anesthesia    pt's mother and sister have hx. of post-op N/V  . Fibromyalgia   . GERD (gastroesophageal reflux disease)   . Heart murmur   . History of gastric ulcer   . Hypertension    states under control with med., has been on med. x 1-2 yr.  . IBS (irritable bowel syndrome)   . Internal hemorrhoid    states has had intermittent bright red bleeding with BM (11/15/2014)  . Intraductal papilloma of breast, right 08/08/2018  . Limited joint  range of motion    neck - s/p cervical fusion  . Localized primary osteoarthritis of right shoulder region 11/23/2014  . Osteoarthritis of left shoulder 07/05/2015  . Osteoarthritis of right shoulder 11/2014  . Pneumonia   . Seizures ([place])    last seizure 2010  . Short-term memory loss   . TMJ syndrome   . Valvular heart disease    Initial workup a number of years ago at [facility].  Echo (10/2009) showed EF 60-65%,      normal LV size, moderate aortic insufficiency with a trileaflet aortic valve and normal aortic root size.  Mild      mitral regurgitation.   . Wears dentures    lower    Past Surgical History:  Procedure Laterality Date  . ABDOMINAL HYSTERECTOMY     partial  . ANTERIOR CERVICAL DECOMP/DISCECTOMY FUSION  02/06/2011   C4-5, C5-6, C6-7  . BREAST BIOPSY Bilateral 10+ yrs ago   neg  . BREAST BIOPSY Right 08/03/2018   2 areas bx, -neg  . BREAST LUMPECTOMY Bilateral 1989   x 3 - benign  . BREAST LUMPECTOMY Right 08/24/2018   Procedure: BREAST LUMPECTOMY WITH ULTRASOUND IN OR;  Surgeon: Vickie Epley, MD;  Location: [facility] ORS;  Service: General;  Laterality: Right;  . CARDIAC CATHETERIZATION  1995  . CESAREAN SECTION     x 2  . COLONOSCOPY  09/28/2008  .  COLONOSCOPY N/A 06/15/2018   Procedure: COLONOSCOPY;  Surgeon: Virgel Manifold, MD;  Location: [facility] ENDOSCOPY;  Service: Endoscopy;  Laterality: N/A;  . COLONOSCOPY WITH PROPOFOL N/A 05/04/2019   Procedure: COLONOSCOPY WITH PROPOFOL;  Surgeon: Virgel Manifold, MD;  Location: [facility] ENDOSCOPY;  Service: Endoscopy;  Laterality: N/A;  . ESOPHAGOGASTRODUODENOSCOPY  09/28/2008  . ESOPHAGOGASTRODUODENOSCOPY N/A 06/14/2018   Procedure: ESOPHAGOGASTRODUODENOSCOPY (EGD);  Surgeon: Virgel Manifold, MD;  Location: [facility] ENDOSCOPY;  Service: Endoscopy;  Laterality: N/A;  . ESOPHAGOGASTRODUODENOSCOPY (EGD) WITH PROPOFOL N/A 05/04/2019   Procedure: ESOPHAGOGASTRODUODENOSCOPY (EGD) WITH PROPOFOL;  Surgeon: Virgel Manifold, MD;   Location: [facility] ENDOSCOPY;  Service: Endoscopy;  Laterality: N/A;  . ESOPHAGOGASTRODUODENOSCOPY (EGD) WITH PROPOFOL N/A 03/18/2020   Procedure: ESOPHAGOGASTRODUODENOSCOPY (EGD) WITH PROPOFOL;  Surgeon: Lin Landsman, MD;  Location[facility]COPY;  Service: Gastroenterology;  Laterality: N/A;  . HARDWARE REMOVAL  11/17/2006   L5-S1  . LAMINECTOMY WITH POSTERIOR LATERAL ARTHRODESIS LEVEL 3  12/06/2009   with synovial cyst resection L3-4 bilat.  Marland Kitchen LAMINECTOMY WITH POSTERIOR LATERAL ARTHRODESIS LEVEL 3  11/17/2006   L4-5  . NM MYOVIEW LTD     Lexiscan myoview (10/2009): EF 77%, normal wall motion, normal perfusion.   Marland Kitchen SHOULDER ARTHROSCOPY WITH DEBRIDEMENT AND BICEP TENDON REPAIR Right 04/13/2014   Procedure: RIGHT SHOULDER ARTHROSCOPY WITH DEBRIDEMENT EXTENSIVE;  Surgeon: Johnny Bridge, MD;  Location: [facility];  Service: Orthopedics;  Laterality: Right;  . TOTAL SHOULDER ARTHROPLASTY Right 11/23/2014   Procedure: TOTAL RIGHT SHOULDER ARTHROPLASTY;  Surgeon: Johnny Bridge, MD;  Location: [facility];  Service: Orthopedics;  Laterality: Right;  . TOTAL SHOULDER ARTHROPLASTY Left 07/05/2015   Procedure: LEFT TOTAL SHOULDER REPLACEMENT;  Surgeon: Marchia Bond, MD;  Location: [facility];  Service: Orthopedics;  Laterality: Left;    Family History  Problem Relation Age of Onset  . Cervical cancer Mother   . Anesthesia problems Mother        post-op N/V  . Rheum arthritis Mother   . Anxiety disorder Mother   . Cancer Father        colon cancer  . Migraines Sister   . Cervical cancer Sister   . Anesthesia problems Sister        post-op N/V  . Migraines Brother   . Asthma Daughter   . Cerebral palsy Daughter        age 38  . Coronary artery disease Maternal Grandmother   . Hypertension Maternal Grandmother   . Diabetes Maternal Grandmother   . Coronary artery disease Paternal Grandmother   . Hypertension Paternal Grandmother   . Diabetes  Paternal Grandmother   . Breast cancer Maternal Aunt   . Breast cancer Maternal Aunt   . Breast cancer Maternal Aunt     Social History:  reports that she has been smoking cigarettes. She has a 42.75 pack-year smoking history. She has never used smokeless tobacco. She reports current alcohol use. She reports current drug use. Drug: Marijuana. She is currently smoking half a pack a day.She drinks "a hair" every now and then. Patient has been on disability in 2005 for "ruptured back". She is a former [employer]. The patient is alone*** today.  Allergies:  Allergies  Allergen Reactions  . Carbamazepine Hives  . Divalproex Sodium Swelling and Other (See Comments)    HAIR FALLS OUT  . Clonazepam Other (See Comments)    HALLUCINATIONS  . Diphenhydramine Hcl Rash  . Tiotropium Bromide Monohydrate Other (See Comments)  CREATES YEAST IN THROAT  . Vicodin [Hydrocodone-Acetaminophen] Itching    Current Medications: Current Outpatient Medications  Medication Sig Dispense Refill  . albuterol (VENTOLIN HFA) 108 (90 Base) MCG/ACT inhaler Inhale 2 puffs into the lungs every 6 (six) hours as needed for wheezing or shortness of breath.    . ALPRAZolam (XANAX) 1 MG tablet TAKE 1/2 TO 1 TABLET BY MOUTH 3 TIMES DAILY AS NEEDED ANXIETY 90 tablet 0  . lamoTRIgine (LAMICTAL) 100 MG tablet TAKE 1 TABLET BY MOUTH TWICE DAILY 180 tablet 3  . latanoprost (XALATAN) 0.005 % ophthalmic solution     . losartan-hydrochlorothiazide (HYZAAR) 100-12.5 MG tablet Take 1 tablet by mouth daily. 90 tablet 3  . omeprazole (PRILOSEC) 40 MG capsule TAKE 1 CAPSULE BY MOUTH TWICE DAILY BEFORE MEALS 60 capsule 1  . ondansetron (ZOFRAN ODT) 4 MG disintegrating tablet Take 1 tablet (4 mg total) by mouth every 8 (eight) hours as needed for nausea or vomiting. 20 tablet 0  . Pancrelipase, Lip-Prot-Amyl, (ZENPEP) 40000-126000 units CPEP Take 2 capsules with the first bite of each meal and 1 capsule with the first bite of each snack 240  capsule 1  . PARoxetine (PAXIL) 20 MG tablet TAKE 1 TABLET BY MOUTH ONCE DAILY 90 tablet 3  . potassium chloride SA (KLOR-CON) 20 MEQ tablet Take 1 tablet (20 mEq total) by mouth daily. 3 tablet 0   No current facility-administered medications for this visit.    Review of Systems  Constitutional: Positive for malaise/fatigue (no energy) and weight loss (4 lbs; unintentional; basline 105-108 lbs). Negative for chills, diaphoresis and fever.        Feeling tired. Sleeping/napping more.   HENT: Negative for congestion, ear discharge, ear pain, hearing loss, nosebleeds, sinus pain, sore throat and tinnitus.        Hoarse voice x 2 weeks. Post nasal drip. Sinus issues.   Eyes: Negative for blurred vision.        Early glaucoma on eye drops.   Respiratory: Negative for cough, hemoptysis, sputum production and shortness of breath (at rest; worse).   Cardiovascular: Negative for chest pain, palpitations and leg swelling.  Gastrointestinal: Positive for diarrhea (after meals). Negative for abdominal pain, blood in stool, constipation (on and off), heartburn, melena, nausea (on Zofran) and vomiting.       No breakfast; sandwich at lunch; dinner is a larger meal. Snacking at night. Bloated stomach. Gassy some days. Drinking Ensure/Boost.  Genitourinary: Negative for dysuria, frequency, hematuria and urgency.  Musculoskeletal: Negative for back pain, joint pain (right shoulder; knees and hands), myalgias and neck pain (right side; pain increases with movement).  Skin: Negative for itching and rash.  Neurological: Negative for dizziness, tingling, sensory change, weakness and headaches.       Change in thinking secondary to feeling tired all the time.   Endo/Heme/Allergies: Does not bruise/bleed easily.  Psychiatric/Behavioral: Negative for depression and memory loss. The patient is not nervous/anxious and does not have insomnia.   All other systems reviewed and are negative.   Performance status  (ECOG): 1-2***  Vitals There were no vitals taken for this visit.   Physical Exam Vitals and nursing note reviewed.  Constitutional:      General: She is not in acute distress.    Appearance: She is well-developed. She is not diaphoretic.     Comments: Thin woman sitting comfortably in the exam room in no acute distress.   HENT:     Head: Normocephalic and atraumatic.  Mouth/Throat:     Pharynx: No oropharyngeal exudate.  Eyes:     General: No scleral icterus.    Conjunctiva/sclera: Conjunctivae normal.     Pupils: Pupils are equal, round, and reactive to light.     Comments: Glasses.  Blue eyes.   Cardiovascular:     Rate and Rhythm: Normal rate and regular rhythm.     Heart sounds: Normal heart sounds. No murmur heard.   Pulmonary:     Effort: Pulmonary effort is normal. No respiratory distress.     Breath sounds: Normal breath sounds. No wheezing or rales.  Chest:     Chest wall: No tenderness.  Abdominal:     General: Bowel sounds are normal. There is no distension.     Palpations: Abdomen is soft. There is no mass.     Tenderness: There is no abdominal tenderness. There is no guarding or rebound.  Musculoskeletal:        General: No tenderness. Normal range of motion.     Cervical back: Normal range of motion and neck supple.  Lymphadenopathy:     Head:     Right side of head: No preauricular, posterior auricular or occipital adenopathy.     Left side of head: No preauricular, posterior auricular or occipital adenopathy.     Cervical: No cervical adenopathy.     Upper Body:     Left upper body: No supraclavicular adenopathy.     Lower Body: No right inguinal adenopathy. No left inguinal adenopathy.  Skin:    General: Skin is warm and dry.  Neurological:     Mental Status: She is alert and oriented to person, place, and time.  Psychiatric:        Behavior: Behavior normal.        Thought Content: Thought content normal.        Judgment: Judgment normal.     No visits with results within 3 Day(s) from this visit.  Latest known visit with results is:  Appointment on 04/18/2020  Component Date Value Ref Range Status  . Ferritin 04/18/2020 36  11 - 307 ng/mL Final   Performed at [facility], [address] [place], [place] [address]  . Iron 04/18/2020 40  28 - 170 ug/dL Final  . TIBC 04/18/2020 322  250 - 450 ug/dL Final  . Saturation Ratios 04/18/2020 12  10.4 - 31.8 % Final  . UIBC 04/18/2020 282  ug/dL Final   Performed at [facility], [address] [place], [place] [address]  . Sodium 04/18/2020 137  135 - 145 mmol/L Final  . Potassium 04/18/2020 3.1* 3.5 - 5.1 mmol/L Final  . Chloride 04/18/2020 102  98 - 111 mmol/L Final  . CO2 04/18/2020 25  22 - 32 mmol/L Final  . Glucose, Bld 04/18/2020 145* 70 - 99 mg/dL Final   Glucose reference range applies only to samples taken after fasting for at least 8 hours.  . BUN 04/18/2020 13  6 - 20 mg/dL Final  . Creatinine, Ser 04/18/2020 0.94  0.44 - 1.00 mg/dL Final  . Calcium 04/18/2020 9.3  8.9 - 10.3 mg/dL Final  . Total Protein 04/18/2020 7.7  6.5 - 8.1 g/dL Final  . Albumin 04/18/2020 4.3  3.5 - 5.0 g/dL Final  . AST 04/18/2020 23  15 - 41 U/L Final  . ALT 04/18/2020 14  0 - 44 U/L Final  . Alkaline Phosphatase 04/18/2020 73  38 - 126 U/L Final  . Total  Bilirubin 04/18/2020 0.2* 0.3 - 1.2 mg/dL Final  . GFR calc non Af Amer 04/18/2020 >60  >60 mL/min Final  . GFR calc Af Amer 04/18/2020 >60  >60 mL/min Final  . Anion gap 04/18/2020 10  5 - 15 Final   Performed at [facility], [address] [place], [place] [address]  . WBC 04/18/2020 10.1  4.0 - 10.5 K/uL Final  . RBC 04/18/2020 4.59  3.87 - 5.11 MIL/uL Final  . Hemoglobin 04/18/2020 14.9  12.0 - 15.0 g/dL Final  . HCT 04/18/2020 42.1  36 - 46 % Final  . MCV 04/18/2020 91.7  80.0 - 100.0 fL Final  . MCH 04/18/2020 32.5  26.0 - 34.0 pg Final  . MCHC 04/18/2020 35.4  30.0 - 36.0 g/dL Final  .  RDW 04/18/2020 13.0  11.5 - 15.5 % Final  . Platelets 04/18/2020 351  150 - 400 K/uL Final  . nRBC 04/18/2020 0.0  0.0 - 0.2 % Final  . Neutrophils Relative % 04/18/2020 56  % Final  . Neutro Abs 04/18/2020 5.6  1.7 - 7.7 K/uL Final  . Lymphocytes Relative 04/18/2020 33  % Final  . Lymphs Abs 04/18/2020 3.3  0.7 - 4.0 K/uL Final  . Monocytes Relative 04/18/2020 9  % Final  . Monocytes Absolute 04/18/2020 0.9  0 - 1 K/uL Final  . Eosinophils Relative 04/18/2020 1  % Final  . Eosinophils Absolute 04/18/2020 0.1  0 - 0 K/uL Final  . Basophils Relative 04/18/2020 1  % Final  . Basophils Absolute 04/18/2020 0.1  0 - 0 K/uL Final  . Immature Granulocytes 04/18/2020 0  % Final  . Abs Immature Granulocytes 04/18/2020 0.04  0.00 - 0.07 K/uL Final   Performed at [facility], [address], [place], [place] [address]    Assessment:  SABENA WINNER is a 59 y.o. female withiron deficiency anemiasecondary to an upper GI bleed. She presented with intermittent melena and chest pain. Hemoglobin nadir was 6.9 on 06/14/2018.Ferritin was 9, iron saturation 11% and TIBC 393 on 06/20/2018. She received 1 unit of PRBCs.  Work-up on 08/27/2019revealed a hematocrit of 31, hemoglobin 10.2, and MCV 70.8. Ferritin was 7. B12 was 165 (low). Folate was 6.3 (>5.9). TSH was 1.256.  Ferritinhas been followed: 9 on 06/20/2018, 7 on 07/05/2018, 5 on 07/19/2018, 81 on 09/12/2018, 12 on 10/24/2018, 71 on 02/07/2019, 51 on 05/09/2019, 32 on 07/28/2019, 29 on 08/28/2019, and 15 on 01/24/2020.   She received Venofer200 mg on 07/25/2018, weekly x 3 (08/15/2018 - 08/29/2018), weekly x 2 (01/10/2019 - 01/17/2019), and03/19/2021.  She has B12 deficiency. She began oral B12on 07/06/2018. B12 was 165 on 07/05/2018, 396 on 07/19/2018, 421 (after B12 injection) on 09/12/2018, and 246 on 10/24/2018 (on oral B12). Folatewas 6.3 on 07/05/2018 and 9.2 on 09/12/2018. She began monthly B12on  08/29/2018 (last03/19/2021).  EGDon 06/14/2018 revealed esophageal mucosal changes c/w short segment of Barrett's and a non-bleeding gastric ulcers. Pathology at the GE junction revealed squamocolumnar mucosa with moderate chronic inflammation. Gastric biopsy revealed no H pylori, dysplasia, or malignancy. EGDon 05/04/2019 revealed non-bleeding gastric ulcer with a clean ulcer base (Forrest Class III)and friable gastric mucosa. Biopsies revealed antral mild reactive gastropathy with no H pylori, metaplasia, dysplasia or malignancy.  Colonoscopyon 06/15/2018 revealed a 5 mm polyp in the sigmoid colon (tubular adenoma). Colonoscopyon 05/04/2019 revealed three 4-5 mm polyps in the sigmoid colon (hyperplastic polyps) and diverticulosis in the sigmoid colon.   Chest CT angiogramon  06/22/2018 reveled no pulmonary embolism. There was underlying emphysematous changes. There were small LUL pulmonary nodular changes (largest 3 mm). There was no thoracic adenopathy.   CT angiogramof the abdomen and pelvison 09/30/2019revealed no evidence of abdominal aortic aneurysm or dissection. No findings to suggest active GI bleeding on CT. There was non-vascular wall thickening involving the transverse colon extending to the splenic flexure, suspicious for infectious/inflammatory colitis. There was associated trace pelvic ascites. There was no pneumatosis or free air. C diff and GI panel by PCR was negative. She was treated with Flagyl and Augmentin.  Chest, abdomen and pelvis CT on 04/12/02021 revealed no evidence of malignancy within the chest, abdomen or pelvis to account for patient's weight loss.  There were no acute findings.  There were a few tiny bilateral pulmonary nodules none greater than 3 mm in size without significant change from the prior exam.  There was mild uniform thickening of the gastric antrum which may be secondary to peristalsis/spasm, although gastritis/antritis was  possible.  BILATERAL mammogramon 07/27/2018 revealed 2 indeterminate masses in the RIGHT breast at the 2:30 and 3 o'clock positions. Follow up breast ultrasoundsshows a 6 x 4 x 5 mm oval hypoechoic mass, 1 cm from the nipple, in the 2:30 position. In the 3 o'clock position, again 1 cm from the nipple, the was a 6 x 3 x 3 mm oval hypoechoic mass. LEFT breast ultrasound showed normal fibroglandular tissue at the 10 o'clock position. Ultrasound guided needle biopsy with clip placement on 08/03/2018. Pathology revealed revealed a dilated duct/cyst with sclerosis of the wall in the 2:30 lesion. Sample negative for atypia and malignancy. Lesion in the 3 o'clock position revealed an intraductal papilloma. Samplewasnegative for atypia and malignancy.  Right breast lumpectomy on 08/24/2018 revealed an intraductal papillomawith adjacent biopsy change. There was sclerotic changes suggestive of cyst/duct wall with adjacent biopsy change. There was duct ectasia and cysts in the tissue between the 2 biopsy sites. There was no atypia or malignancy.  Screening bilateral mammogramon 08/16/2019 revealed no evidence of malignancy.   Symptomatically, ***  Plan: 1.   Review labs.  2.Weight loss Current weight is 90 pounds. Patient's baseline weight is105-108 pounds. Patient has diarrhea after meals. Thyroid function is normal. Patient smokes.  Low dose chest CT 03/30/2019 revealed scattered small LLL nodules. CT angiogramof the abdomen and pelvison 09/30/2019revealedno masses.             Mammogram on 11/15/2018 revealed no evidence of malignancy. Follow-up with dietician. `           Samples of Ensure today. Consider testing for carcinoid (24 hour urine for 5HIAA).             Readdress imaging (PET) scan to r/o maligancy. 3.Iron deficiency anemia Hematocrit  42.1.Hemoglobin14.9.MCV91.7. Ferritin36 with an iron saturation of 12% and a TIBC of 322. She received Venofer x 1 on 01/26/2020. Continue to monitor. 4.B12 deficiency Patient receives B12 injections. Last B12was on 03/22/2020. B12 today and monthly x 6. Check folate annually. 5.Pulmonary nodules Low dose chest CT on 03/30/2019 revealedscattered small(up to 3.9 mm)bilateral pulmonary nodules in the left lower lobe. Chest CT on 02/19/2020 revealed no evidence of maligancy.  She has emphysema and a few tiny pulmonary nodules (none > 3 mm). Continue to monitor annually. 6. GI issues Patientnotes minimal rectal bleeding. She has chronic nausea. She is on Protonix and ondansetron. She has off and on diarrhea and constipation. She has had no improvement with pancreatic enzymes. Follow-up with Dr  Tahiliani. 7. Hypokalemia  Potassium 20 meq po x 3 days. 8.   PET scan in 2 weeks. 9.   RTC after PET scan for MD assessment and review of imaging.  I discussed the assessment and treatment plan with the patient.  The patient was provided an opportunity to ask questions and all were answered.  The patient agreed with the plan and demonstrated an understanding of the instructions.  The patient was advised to call back if the symptoms worsen or if the condition fails to improve as anticipated.  I provided *** minutes of face-to-face time during this encounter and > 50% was spent counseling as documented under my assessment and plan. An additional *** minutes were spent reviewing her chart (Epic and Care Everywhere) including notes, labs, and imaging studies.    Lequita Asal, MD, PhD    05/05/2020, 2:30 PM  I, De Burrs, am acting as scribe for [employer]. Mike Gip, MD, PhD.  I, Melissa C. Mike Gip, MD, have reviewed the above documentation for accuracy and completeness, and I agree with the above.

## 2020-05-06 ENCOUNTER — Inpatient Hospital Stay: Payer: Medicare HMO | Admitting: Hematology and Oncology

## 2020-05-15 ENCOUNTER — Encounter
Admission: RE | Admit: 2020-05-15 | Discharge: 2020-05-15 | Disposition: A | Discharge status: Home / Self Care | Payer: Medicare HMO | Source: Ambulatory Visit | Attending: Hematology and Oncology | Admitting: Hematology and Oncology

## 2020-05-15 ENCOUNTER — Other Ambulatory Visit: Payer: Self-pay

## 2020-05-15 DIAGNOSIS — Z8541 Personal history of malignant neoplasm of cervix uteri: Secondary | ICD-10-CM | POA: Diagnosis not present

## 2020-05-15 DIAGNOSIS — R634 Abnormal weight loss: Secondary | ICD-10-CM | POA: Diagnosis not present

## 2020-05-15 LAB — GLUCOSE, CAPILLARY: Glucose-Capillary: 100 mg/dL — ABNORMAL HIGH (ref 70–99)

## 2020-05-15 MED ORDER — FLUDEOXYGLUCOSE F - 18 (FDG) INJECTION
5.2000 | Freq: Once | INTRAVENOUS | Status: AC | PRN
  Administered 2020-05-15: 5.61 via INTRAVENOUS

## 2020-05-16 NOTE — Progress Notes (Signed)
[facility]  [address] [place], [place] [address] Phone: [phone]  Fax: [phone]   Clinic Day:  05/17/2020  Referring physician: Venia Carbon, MD  Chief Complaint: Molly Peters is a 59 y.o. female with iron deficiency anemia, B12 deficiency, andunexplained weight loss who is seen for review of interval PET scan and discussion regarding direction of therapy.  HPI: The patient was last seen in the hematology clinic on 04/18/2020.  At that time, she continued to lose weight.  She had diarrhea after eating despite ZenPep (pancreatic enzyme replacement).  Prior CT scans were negative.  We discussed PET scan to r/o malignancy.    PET scan on 05/15/2020 revealed no definite signs of malignancy in the neck, chest, abdomen or pelvis. There was an equivocal area of variable uptake with some fullness in the LEFT glossotonsillar region and parapharyngeal region. This could relate to prior spinal surgery and apparent uptake pattern could be affected by the abundant artifact from cervical hardware, dental hardware and bilateral shoulder arthroplasty changes. Consider correlation with direct visualization as clinically warranted. There was aortic atherosclerosis.   During the interim, she has been the same. She notes that her neck pain has gotten worse, but she "hurts everywhere." She reports nausea, which she takes Zofran. The Zenpep helps her bloating. Her voice is occasionally hoarse. Shortness of breath and fatigue is unchanged.  The patient has on and off diarrhea and constipation. The other day, she had a bowel movement and noted that there was a large amount of blood in her stool. She reports dizziness and has a headache today. She eats breakfast some days, a sandwich for lunch, and a bigger meal for dinner. She is bloated and gassy everyday. She has not been taking oral iron.  The patient has had 3 back surgeries and one neck surgery.  The  patient would like an ENT evaluation. She agrees to have her potassium checked today.   Past Medical History:  Diagnosis Date  . Anxiety   . Cervical cancer ([place]) 1980's  . Chronic back pain 11/12/2009   Qualifier: Diagnosis of  By: Maxie Better FNP, Rosalita Levan   . COPD (chronic obstructive pulmonary disease) (HCC)    states SOB with ADLs; no O2 use; able to speak in complete sentences without SOB(11/15/2014)  . Depression   . Difficulty swallowing pills    s/p cervical fusion  . Family history of adverse reaction to anesthesia    pt's mother and sister have hx. of post-op N/V  . Fibromyalgia   . GERD (gastroesophageal reflux disease)   . Heart murmur   . History of gastric ulcer   . Hypertension    states under control with med., has been on med. x 1-2 yr.  . IBS (irritable bowel syndrome)   . Internal hemorrhoid    states has had intermittent bright red bleeding with BM (11/15/2014)  . Intraductal papilloma of breast, right 08/08/2018  . Limited joint range of motion    neck - s/p cervical fusion  . Localized primary osteoarthritis of right shoulder region 11/23/2014  . Osteoarthritis of left shoulder 07/05/2015  . Osteoarthritis of right shoulder 11/2014  . Pneumonia   . Seizures ([place])    last seizure 2010  . Short-term memory loss   . TMJ syndrome   . Valvular heart disease    Initial workup a number of years ago at [facility].  Echo (10/2009) showed EF 60-65%,      normal  LV size, moderate aortic insufficiency with a trileaflet aortic valve and normal aortic root size.  Mild      mitral regurgitation.   . Wears dentures    lower    Past Surgical History:  Procedure Laterality Date  . ABDOMINAL HYSTERECTOMY     partial  . ANTERIOR CERVICAL DECOMP/DISCECTOMY FUSION  02/06/2011   C4-5, C5-6, C6-7  . BREAST BIOPSY Bilateral 10+ yrs ago   neg  . BREAST BIOPSY Right 08/03/2018   2 areas bx, -neg  . BREAST LUMPECTOMY Bilateral 1989   x 3 - benign  . BREAST LUMPECTOMY Right  08/24/2018   Procedure: BREAST LUMPECTOMY WITH ULTRASOUND IN OR;  Surgeon: Vickie Epley, MD;  Location: [facility] ORS;  Service: General;  Laterality: Right;  . CARDIAC CATHETERIZATION  1995  . CESAREAN SECTION     x 2  . COLONOSCOPY  09/28/2008  . COLONOSCOPY N/A 06/15/2018   Procedure: COLONOSCOPY;  Surgeon: Virgel Manifold, MD;  Location: [facility] ENDOSCOPY;  Service: Endoscopy;  Laterality: N/A;  . COLONOSCOPY WITH PROPOFOL N/A 05/04/2019   Procedure: COLONOSCOPY WITH PROPOFOL;  Surgeon: Virgel Manifold, MD;  Location: [facility] ENDOSCOPY;  Service: Endoscopy;  Laterality: N/A;  . ESOPHAGOGASTRODUODENOSCOPY  09/28/2008  . ESOPHAGOGASTRODUODENOSCOPY N/A 06/14/2018   Procedure: ESOPHAGOGASTRODUODENOSCOPY (EGD);  Surgeon: Virgel Manifold, MD;  Location[facility];  Service: Endoscopy;  Laterality: N/A;  . ESOPHAGOGASTRODUODENOSCOPY (EGD) WITH PROPOFOL N/A 05/04/2019   Procedure: ESOPHAGOGASTRODUODENOSCOPY (EGD) WITH PROPOFOL;  Surgeon: Virgel Manifold, MD;  Location: [facility] ENDOSCOPY;  Service: Endoscopy;  Laterality: N/A;  . ESOPHAGOGASTRODUODENOSCOPY (EGD) WITH PROPOFOL N/A 03/18/2020   Procedure: ESOPHAGOGASTRODUODENOSCOPY (EGD) WITH PROPOFOL;  Surgeon: Lin Landsman, MD;  Location: [facility] ENDOSCOPY;  Service: Gastroenterology;  Laterality: N/A;  . HARDWARE REMOVAL  11/17/2006   L5-S1  . LAMINECTOMY WITH POSTERIOR LATERAL ARTHRODESIS LEVEL 3  12/06/2009   with synovial cyst resection L3-4 bilat.  Marland Kitchen LAMINECTOMY WITH POSTERIOR LATERAL ARTHRODESIS LEVEL 3  11/17/2006   L4-5  . NM MYOVIEW LTD     Lexiscan myoview (10/2009): EF 77%, normal wall motion, normal perfusion.   Marland Kitchen SHOULDER ARTHROSCOPY WITH DEBRIDEMENT AND BICEP TENDON REPAIR Right 04/13/2014   Procedure: RIGHT SHOULDER ARTHROSCOPY WITH DEBRIDEMENT EXTENSIVE;  Surgeon: Johnny Bridge, MD;  Location: [place];  Service: Orthopedics;  Laterality: Right;  . TOTAL SHOULDER ARTHROPLASTY Right 11/23/2014    Procedure: TOTAL RIGHT SHOULDER ARTHROPLASTY;  Surgeon: Johnny Bridge, MD;  Location: [place];  Service: Orthopedics;  Laterality: Right;  . TOTAL SHOULDER ARTHROPLASTY Left 07/05/2015   Procedure: LEFT TOTAL SHOULDER REPLACEMENT;  Surgeon: Marchia Bond, MD;  Location: [place];  Service: Orthopedics;  Laterality: Left;    Family History  Problem Relation Age of Onset  . Cervical cancer Mother   . Anesthesia problems Mother        post-op N/V  . Rheum arthritis Mother   . Anxiety disorder Mother   . Cancer Father        colon cancer  . Migraines Sister   . Cervical cancer Sister   . Anesthesia problems Sister        post-op N/V  . Migraines Brother   . Asthma Daughter   . Cerebral palsy Daughter        age 83  . Coronary artery disease Maternal Grandmother   . Hypertension Maternal Grandmother   . Diabetes Maternal Grandmother   . Coronary artery disease Paternal Grandmother   .  Hypertension Paternal Grandmother   . Diabetes Paternal Grandmother   . Breast cancer Maternal Aunt   . Breast cancer Maternal Aunt   . Breast cancer Maternal Aunt     Social History:  reports that she has been smoking cigarettes. She has a 42.75 pack-year smoking history. She has never used smokeless tobacco. She reports current alcohol use. She reports current drug use. Drug: Marijuana. She is currently smoking half a pack a day.She drinks "a hair" every now and then. Patient has been on disability in 2005 for "ruptured back". She is a former [employer]. The patient is alone today.  Allergies:  Allergies  Allergen Reactions  . Carbamazepine Hives  . Divalproex Sodium Swelling and Other (See Comments)    HAIR FALLS OUT  . Clonazepam Other (See Comments)    HALLUCINATIONS  . Diphenhydramine Hcl Rash  . Tiotropium Bromide Monohydrate Other (See Comments)    CREATES YEAST IN THROAT  . Vicodin [Hydrocodone-Acetaminophen] Itching    Current Medications: Current  Outpatient Medications  Medication Sig Dispense Refill  . albuterol (VENTOLIN HFA) 108 (90 Base) MCG/ACT inhaler Inhale 2 puffs into the lungs every 6 (six) hours as needed for wheezing or shortness of breath.    . ALPRAZolam (XANAX) 1 MG tablet TAKE 1/2 TO 1 TABLET BY MOUTH 3 TIMES DAILY AS NEEDED ANXIETY 90 tablet 0  . lamoTRIgine (LAMICTAL) 100 MG tablet TAKE 1 TABLET BY MOUTH TWICE DAILY 180 tablet 3  . latanoprost (XALATAN) 0.005 % ophthalmic solution     . losartan-hydrochlorothiazide (HYZAAR) 100-12.5 MG tablet Take 1 tablet by mouth daily. 90 tablet 3  . omeprazole (PRILOSEC) 40 MG capsule TAKE 1 CAPSULE BY MOUTH TWICE DAILY BEFORE MEALS 60 capsule 1  . ondansetron (ZOFRAN ODT) 4 MG disintegrating tablet Take 1 tablet (4 mg total) by mouth every 8 (eight) hours as needed for nausea or vomiting. 20 tablet 0  . Pancrelipase, Lip-Prot-Amyl, (ZENPEP) 40000-126000 units CPEP Take 2 capsules with the first bite of each meal and 1 capsule with the first bite of each snack 240 capsule 1  . PARoxetine (PAXIL) 20 MG tablet TAKE 1 TABLET BY MOUTH ONCE DAILY 90 tablet 3  . potassium chloride SA (KLOR-CON) 20 MEQ tablet Take 1 tablet (20 mEq total) by mouth daily. 3 tablet 0   No current facility-administered medications for this visit.    Review of Systems  Constitutional: Positive for malaise/fatigue (takes naps). Negative for chills, diaphoresis, fever and weight loss (stable).        Feels the same.  HENT: Negative.  Negative for congestion, ear discharge, ear pain, hearing loss, nosebleeds, sinus pain, sore throat and tinnitus.        Hoarse voice comes and goes  Eyes: Negative for blurred vision.        Early glaucoma on eye drops.   Respiratory: Positive for shortness of breath. Negative for cough, hemoptysis and sputum production.   Cardiovascular: Negative.  Negative for chest pain, palpitations and leg swelling.  Gastrointestinal: Positive for blood in stool, constipation (on and off),  diarrhea (on and off) and nausea (on Zofran). Negative for abdominal pain, heartburn, melena and vomiting.       Usually no breakfast; sandwich at lunch; bigger meal at night. Bloated and gassy everyday.  Genitourinary: Negative.  Negative for dysuria, frequency, hematuria and urgency.  Musculoskeletal: Positive for neck pain (LEFT side; pain increases with movement). Negative for back pain, joint pain and myalgias.       "hurts  everywhere."  Skin: Negative.  Negative for itching and rash.  Neurological: Positive for dizziness and headaches. Negative for tingling, sensory change and weakness.  Endo/Heme/Allergies: Does not bruise/bleed easily.  Psychiatric/Behavioral: Negative for depression and memory loss. The patient is not nervous/anxious and does not have insomnia.   All other systems reviewed and are negative.   Performance status (ECOG): 1-2  Vitals Blood pressure 132/80, pulse 70, temperature (!) 96.8 F (36 C), temperature source Tympanic, weight 90 lb 11.5 oz (41.2 kg), SpO2 98 %.   Physical Exam Vitals and nursing note reviewed.  Constitutional:      General: She is not in acute distress.    Appearance: She is well-developed. She is not diaphoretic.     Comments: Thin woman sitting comfortably in the exam room in no acute distress.   HENT:     Head: Normocephalic and atraumatic.     Mouth/Throat:     Pharynx: No oropharyngeal exudate.  Eyes:     General: No scleral icterus.    Conjunctiva/sclera: Conjunctivae normal.     Pupils: Pupils are equal, round, and reactive to light.     Comments: Glasses.  Blue eyes.   Cardiovascular:     Rate and Rhythm: Normal rate and regular rhythm.     Heart sounds: Normal heart sounds. No murmur heard.   Pulmonary:     Effort: Pulmonary effort is normal. No respiratory distress.     Breath sounds: Normal breath sounds. No wheezing or rales.  Abdominal:     General: Bowel sounds are normal. There is no distension.     Palpations:  Abdomen is soft. There is no mass.     Tenderness: There is no abdominal tenderness. There is no guarding or rebound.  Musculoskeletal:        General: No tenderness. Normal range of motion.     Cervical back: Normal range of motion and neck supple.  Lymphadenopathy:     Head:     Right side of head: No preauricular, posterior auricular or occipital adenopathy.     Left side of head: No preauricular, posterior auricular or occipital adenopathy.     Cervical: No cervical adenopathy.     Upper Body:     Left upper body: No supraclavicular adenopathy.     Lower Body: No right inguinal adenopathy. No left inguinal adenopathy.  Skin:    General: Skin is warm and dry.  Neurological:     Mental Status: She is alert and oriented to person, place, and time.  Psychiatric:        Behavior: Behavior normal.        Thought Content: Thought content normal.        Judgment: Judgment normal.    Hospital Outpatient Visit on 05/15/2020  Component Date Value Ref Range Status  . Glucose-Capillary 05/15/2020 100* 70 - 99 mg/dL Final   Glucose reference range applies only to samples taken after fasting for at least 8 hours.    Assessment:  Molly Peters is a 59 y.o. female withiron deficiency anemiasecondary to an upper GI bleed. She presented with intermittent melena and chest pain. Hemoglobin nadir was 6.9 on 06/14/2018.Ferritin was 9, iron saturation 11% and TIBC 393 on 06/20/2018. She received 1 unit of PRBCs.  Work-up on 08/27/2019revealed a hematocrit of 31, hemoglobin 10.2, and MCV 70.8. Ferritin was 7. B12 was 165 (low). Folate was 6.3 (>5.9). TSH was 1.256.  Ferritinhas been followed: 9 on 06/20/2018, 7 on 07/05/2018, 5 on  07/19/2018, 81 on 09/12/2018, 12 on 10/24/2018, 71 on 02/07/2019, 51 on 05/09/2019, 32 on 07/28/2019, 29 on 08/28/2019, and 15 on 01/24/2020.   She received Venofer200 mg on 07/25/2018, weekly x 3 (08/15/2018 - 08/29/2018), weekly x 2 (01/10/2019 -  01/17/2019), and03/19/2021.  She has B12 deficiency. She began oral B12on 07/06/2018. B12 was 165 on 07/05/2018, 396 on 07/19/2018, 421 (after B12 injection) on 09/12/2018, and 246 on 10/24/2018 (on oral B12). Folatewas 6.3 on 07/05/2018 and 9.2 on 09/12/2018. She began monthly B12on 08/29/2018 (last03/19/2021).  EGDon 06/14/2018 revealed esophageal mucosal changes c/w short segment of Barrett's and a non-bleeding gastric ulcers. Pathology at the GE junction revealed squamocolumnar mucosa with moderate chronic inflammation. Gastric biopsy revealed no H pylori, dysplasia, or malignancy. EGDon 05/04/2019 revealed non-bleeding gastric ulcer with a clean ulcer base (Forrest Class III)and friable gastric mucosa. Biopsies revealed antral mild reactive gastropathy with no H pylori, metaplasia, dysplasia or malignancy.  Colonoscopyon 06/15/2018 revealed a 5 mm polyp in the sigmoid colon (tubular adenoma). Colonoscopyon 05/04/2019 revealed three 4-5 mm polyps in the sigmoid colon (hyperplastic polyps) and diverticulosis in the sigmoid colon.   Chest CT angiogramon 06/22/2018 reveled no pulmonary embolism. There was underlying emphysematous changes. There were small LUL pulmonary nodular changes (largest 3 mm). There was no thoracic adenopathy.   CT angiogramof the abdomen and pelvison 09/30/2019revealed no evidence of abdominal aortic aneurysm or dissection. No findings to suggest active GI bleeding on CT. There was non-vascular wall thickening involving the transverse colon extending to the splenic flexure, suspicious for infectious/inflammatory colitis. There was associated trace pelvic ascites. There was no pneumatosis or free air. C diff and GI panel by PCR was negative. She was treated with Flagyl and Augmentin.  Chest, abdomen and pelvis CT on 04/12/02021 revealed no evidence of malignancy within the chest, abdomen or pelvis to account for patient's weight loss.  There  were no acute findings.  There were a few tiny bilateral pulmonary nodules none greater than 3 mm in size without significant change from the prior exam.  There was mild uniform thickening of the gastric antrum which may be secondary to peristalsis/spasm, although gastritis/antritis was possible.  PET scan on 05/15/2020 revealed no definite signs of malignancy in the neck, chest, abdomen or pelvis. There was an equivocal area of variable uptake with some fullness in the LEFT glossotonsillar region and parapharyngeal region. This could relate to prior spinal surgery and apparent uptake pattern could be affected by the abundant artifact from cervical hardware, dental hardware and bilateral shoulder arthroplasty changes.  BILATERAL mammogramon 07/27/2018 revealed 2 indeterminate masses in the RIGHT breast at the 2:30 and 3 o'clock positions. Follow up breast ultrasoundsshows a 6 x 4 x 5 mm oval hypoechoic mass, 1 cm from the nipple, in the 2:30 position. In the 3 o'clock position, again 1 cm from the nipple, the was a 6 x 3 x 3 mm oval hypoechoic mass. LEFT breast ultrasound showed normal fibroglandular tissue at the 10 o'clock position. Ultrasound guided needle biopsy with clip placement on 08/03/2018. Pathology revealed revealed a dilated duct/cyst with sclerosis of the wall in the 2:30 lesion. Sample negative for atypia and malignancy. Lesion in the 3 o'clock position revealed an intraductal papilloma. Samplewasnegative for atypia and malignancy.  Right breast lumpectomy on 08/24/2018 revealed an intraductal papillomawith adjacent biopsy change. There was sclerotic changes suggestive of cyst/duct wall with adjacent biopsy change. There was duct ectasia and cysts in the tissue between the 2 biopsy sites. There was no atypia  or malignancy.  Screening bilateral mammogramon 08/16/2019 revealed no evidence of malignancy.   Symptomatically, she is hoarse.  She note interval rectal bleeding. Weight is  stable.  Plan: 1.   Labs today:  BMP. 2.Weight loss Current weight is 90 pounds (stable). Patient's baseline weight is105-108 pounds. Patient has intermittent diarrhea. Thyroid function is normal. Patient smokes.   Low dose chest CT 03/30/2019 revealed scattered small LLL nodules. CT angiogramof the abdomen and pelvison 09/30/2019revealedno masses.             Mammogram on 11/15/2018 revealed no evidence of malignancy. PET scan on 05/15/2020 was personally reviewed.  Agree with radiology interpretation.   There is no definitive evidence of malignancy.   There is equivocal uptake in the LEFT glossotonsillar region and parapharyngeal region.   Discuss referral to ENT.  Patient in agreement.  Continue to monitor. 3.Iron deficiency anemia Hematocrit 42.1.Hemoglobin14.9.MCV91.7 on 04/18/2020. Ferritin36 with an iron saturation of 12% and a TIBC of 322. Patient notes interval rectal bleeding. Encourage follow-up with GI. Patient to be seen immediately if recurrent bleeding. Continue to monitor. 4.B12 deficiency Patient receives B12 injections. Last B12was on 04/18/2020. B12 today and monthly x 6. Check folate annually. 5.Pulmonary nodules Low dose chest CT on 03/30/2019 revealedscattered small(up to 3.9 mm)bilateral pulmonary nodules in the left lower lobe. Chest CT on 02/19/2020 revealed no evidence of maligancy.  She has emphysema and a few tiny pulmonary nodules (none > 3 mm). Continue low dose chest CT program. 6. GI issues Patientincrease in rectal bleeding. She has chronic nausea. She remains on Protonix and ondansetron.  Rx: ondansetron. She has diarrhea and constipation. She notes no improvement with pancreatic enzymes. Follow-up with Dr Bonna Gains. 7. Hypokalemia, resolved  Potassium 3.6 after supplementation. 8.   ENT  referral. 9.   Please call Dr Bonna Gains office re: f/u appointment; rectal bleeding x 2. 10.   RTC monthly x 2 for weight check and B12. 11.   RTC in 3 months for MD assessment, labs (CBC with diff, CMP, ferritin), and B12.  I discussed the assessment and treatment plan with the patient.  The patient was provided an opportunity to ask questions and all were answered.  The patient agreed with the plan and demonstrated an understanding of the instructions.  The patient was advised to call back if the symptoms worsen or if the condition fails to improve as anticipated.  I provided 15 minutes of face-to-face time during this encounter and > 50% was spent counseling as documented under my assessment and plan.  .An additional 5 minutes were spent reviewing her chart (Epic and Care Everywhere) including notes, labs, and imaging studies.    Lequita Asal, MD, PhD    05/17/2020, 2:25 PM  I, De Burrs, am acting as scribe for [employer]. Mike Gip, MD, PhD.  I, Genita Nilsson C. Mike Gip, MD, have reviewed the above documentation for accuracy and completeness, and I agree with the above.

## 2020-05-17 ENCOUNTER — Ambulatory Visit: Payer: Medicare HMO

## 2020-05-17 ENCOUNTER — Telehealth: Payer: Self-pay

## 2020-05-17 ENCOUNTER — Other Ambulatory Visit: Payer: Self-pay

## 2020-05-17 ENCOUNTER — Encounter: Payer: Self-pay | Admitting: Hematology and Oncology

## 2020-05-17 ENCOUNTER — Inpatient Hospital Stay (HOSPITAL_BASED_OUTPATIENT_CLINIC_OR_DEPARTMENT_OTHER): Payer: Medicare HMO | Admitting: Hematology and Oncology

## 2020-05-17 ENCOUNTER — Inpatient Hospital Stay: Payer: Medicare HMO | Attending: Hematology and Oncology

## 2020-05-17 VITALS — BP 132/80 | HR 70 | Temp 96.8°F | Wt 90.7 lb

## 2020-05-17 DIAGNOSIS — K319 Disease of stomach and duodenum, unspecified: Secondary | ICD-10-CM | POA: Diagnosis not present

## 2020-05-17 DIAGNOSIS — Z8049 Family history of malignant neoplasm of other genital organs: Secondary | ICD-10-CM | POA: Diagnosis not present

## 2020-05-17 DIAGNOSIS — Z803 Family history of malignant neoplasm of breast: Secondary | ICD-10-CM | POA: Diagnosis not present

## 2020-05-17 DIAGNOSIS — R634 Abnormal weight loss: Secondary | ICD-10-CM | POA: Insufficient documentation

## 2020-05-17 DIAGNOSIS — E538 Deficiency of other specified B group vitamins: Secondary | ICD-10-CM | POA: Diagnosis not present

## 2020-05-17 DIAGNOSIS — D500 Iron deficiency anemia secondary to blood loss (chronic): Secondary | ICD-10-CM

## 2020-05-17 DIAGNOSIS — D509 Iron deficiency anemia, unspecified: Secondary | ICD-10-CM | POA: Insufficient documentation

## 2020-05-17 DIAGNOSIS — K2270 Barrett's esophagus without dysplasia: Secondary | ICD-10-CM | POA: Insufficient documentation

## 2020-05-17 DIAGNOSIS — Z79899 Other long term (current) drug therapy: Secondary | ICD-10-CM | POA: Insufficient documentation

## 2020-05-17 DIAGNOSIS — Z825 Family history of asthma and other chronic lower respiratory diseases: Secondary | ICD-10-CM | POA: Insufficient documentation

## 2020-05-17 DIAGNOSIS — K219 Gastro-esophageal reflux disease without esophagitis: Secondary | ICD-10-CM | POA: Diagnosis not present

## 2020-05-17 DIAGNOSIS — Z8719 Personal history of other diseases of the digestive system: Secondary | ICD-10-CM | POA: Diagnosis not present

## 2020-05-17 DIAGNOSIS — Z800 Family history of malignant neoplasm of digestive organs: Secondary | ICD-10-CM | POA: Insufficient documentation

## 2020-05-17 DIAGNOSIS — F17210 Nicotine dependence, cigarettes, uncomplicated: Secondary | ICD-10-CM | POA: Diagnosis not present

## 2020-05-17 DIAGNOSIS — D125 Benign neoplasm of sigmoid colon: Secondary | ICD-10-CM | POA: Diagnosis not present

## 2020-05-17 DIAGNOSIS — Z833 Family history of diabetes mellitus: Secondary | ICD-10-CM | POA: Insufficient documentation

## 2020-05-17 DIAGNOSIS — Z8249 Family history of ischemic heart disease and other diseases of the circulatory system: Secondary | ICD-10-CM | POA: Insufficient documentation

## 2020-05-17 DIAGNOSIS — R490 Dysphonia: Secondary | ICD-10-CM | POA: Diagnosis not present

## 2020-05-17 DIAGNOSIS — K5730 Diverticulosis of large intestine without perforation or abscess without bleeding: Secondary | ICD-10-CM | POA: Insufficient documentation

## 2020-05-17 DIAGNOSIS — Z820 Family history of epilepsy and other diseases of the nervous system: Secondary | ICD-10-CM | POA: Insufficient documentation

## 2020-05-17 DIAGNOSIS — M542 Cervicalgia: Secondary | ICD-10-CM | POA: Diagnosis not present

## 2020-05-17 DIAGNOSIS — R519 Headache, unspecified: Secondary | ICD-10-CM | POA: Insufficient documentation

## 2020-05-17 DIAGNOSIS — Z885 Allergy status to narcotic agent status: Secondary | ICD-10-CM | POA: Diagnosis not present

## 2020-05-17 DIAGNOSIS — Z8261 Family history of arthritis: Secondary | ICD-10-CM | POA: Insufficient documentation

## 2020-05-17 DIAGNOSIS — R42 Dizziness and giddiness: Secondary | ICD-10-CM | POA: Diagnosis not present

## 2020-05-17 DIAGNOSIS — K254 Chronic or unspecified gastric ulcer with hemorrhage: Secondary | ICD-10-CM | POA: Insufficient documentation

## 2020-05-17 DIAGNOSIS — I700 Atherosclerosis of aorta: Secondary | ICD-10-CM | POA: Insufficient documentation

## 2020-05-17 DIAGNOSIS — E876 Hypokalemia: Secondary | ICD-10-CM

## 2020-05-17 LAB — BASIC METABOLIC PANEL
Anion gap: 7 (ref 5–15)
BUN: 16 mg/dL (ref 6–20)
CO2: 31 mmol/L (ref 22–32)
Calcium: 9.6 mg/dL (ref 8.9–10.3)
Chloride: 103 mmol/L (ref 98–111)
Creatinine, Ser: 0.86 mg/dL (ref 0.44–1.00)
GFR calc Af Amer: >60 mL/min (ref >60)
GFR calc non Af Amer: >60 mL/min (ref >60)
Glucose, Bld: 97 mg/dL (ref 70–99)
Potassium: 3.6 mmol/L (ref 3.5–5.1)
Sodium: 141 mmol/L (ref 135–145)

## 2020-05-17 MED ORDER — CYANOCOBALAMIN 1000 MCG/ML IJ SOLN
1000.0000 ug | Freq: Once | INTRAMUSCULAR | Status: AC
  Administered 2020-05-17: 1000 ug via INTRAMUSCULAR

## 2020-05-17 NOTE — Telephone Encounter (Signed)
Faxed new patient referral to [facility] ENT / Dr Kathyrn Sheriff office.

## 2020-05-17 NOTE — Progress Notes (Signed)
The patient reports going to the bathroom to make a bm and she states that there was a large amount of bright red blood in the toilet. The patient states it happens from time to time, which is nothing new.

## 2020-05-20 ENCOUNTER — Other Ambulatory Visit: Payer: Self-pay | Admitting: *Deleted

## 2020-05-20 DIAGNOSIS — K219 Gastro-esophageal reflux disease without esophagitis: Secondary | ICD-10-CM

## 2020-05-20 MED ORDER — ONDANSETRON 4 MG PO TBDP: 4.0000 mg | ORAL_TABLET | Freq: Three times a day (TID) | ORAL | 0 refills | Status: DC | PRN

## 2020-05-24 DIAGNOSIS — F17200 Nicotine dependence, unspecified, uncomplicated: Secondary | ICD-10-CM | POA: Diagnosis not present

## 2020-05-24 DIAGNOSIS — J310 Chronic rhinitis: Secondary | ICD-10-CM | POA: Diagnosis not present

## 2020-05-24 DIAGNOSIS — R490 Dysphonia: Secondary | ICD-10-CM | POA: Diagnosis not present

## 2020-05-24 DIAGNOSIS — K219 Gastro-esophageal reflux disease without esophagitis: Secondary | ICD-10-CM | POA: Diagnosis not present

## 2020-05-27 ENCOUNTER — Other Ambulatory Visit: Payer: Self-pay

## 2020-05-27 ENCOUNTER — Inpatient Hospital Stay: Payer: Medicare HMO

## 2020-05-27 NOTE — Progress Notes (Signed)
Nutrition Follow-up:  Patient with iron deficiency anemia, B12 deficiency and unexplained weight loss.  Patient seen by GI.  Followed by Dr. Mike Gip.  Noted PET on 05/15/20 no evidenced by malignancy per MD note.   Met with patient in clinic for nutrition follow-up.  Patient reports that appetite is good, does have up and down days.  Patient reports gas and bloating daily.  Drank boost shake this am and went to bathroom before visit.  No diarrhea just gas.  Reports no diarrhea for several weeks.  Has not followed up with GI yet. Reports nausea yesterday that she took zofran for with relief.    Medications: zenpep, prilosec, zofran  Labs: reviewed  Anthropometrics:   Weight 91 lb 7 oz today in clinic.    7/9 90 lb 11.5 oz  92 lb 3 oz on 6/21   Estimated Energy Needs  Kcals: 1300-1500 Protein: 65-75 g Fluid: > 1.3 L  NUTRITION DIAGNOSIS: Inadequate oral intake continues   INTERVENTION:  Patient to call GI doctor's office today to get follow-up appointment. Provided patient more samples of orgain (vegan option, dairy free) shake today to see if less gas, bloating.  Patient has tried ensure clear shakes and really liked it.  Provided another sample and boost soothe sample.  Cautioned patient on high sugary beverages can increase GI issues.  Patient will monitor.   Patient has contact information    MONITORING, EVALUATION, GOAL: weight trends, intake, GI issues   NEXT VISIT: August 23 face to face  Molly Peters, [place], [place] Registered Dietitian [phone] (pager)

## 2020-06-07 ENCOUNTER — Other Ambulatory Visit: Payer: Self-pay

## 2020-06-07 ENCOUNTER — Emergency Department
Admission: EM | Admit: 2020-06-07 | Discharge: 2020-06-07 | Disposition: A | Discharge status: Home / Self Care | Payer: Medicare HMO | Attending: Emergency Medicine | Admitting: Emergency Medicine

## 2020-06-07 DIAGNOSIS — M25551 Pain in right hip: Secondary | ICD-10-CM | POA: Insufficient documentation

## 2020-06-07 DIAGNOSIS — Z5321 Procedure and treatment not carried out due to patient leaving prior to being seen by health care provider: Secondary | ICD-10-CM | POA: Diagnosis not present

## 2020-06-07 DIAGNOSIS — M545 Low back pain: Secondary | ICD-10-CM | POA: Diagnosis not present

## 2020-06-07 NOTE — ED Triage Notes (Signed)
Pt arrives via POV for reports of right hip and back pain x 3 days. No known injury. 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 Pt skin warm and dry.

## 2020-06-08 DIAGNOSIS — M79604 Pain in right leg: Secondary | ICD-10-CM | POA: Diagnosis not present

## 2020-06-08 DIAGNOSIS — Z981 Arthrodesis status: Secondary | ICD-10-CM | POA: Diagnosis not present

## 2020-06-08 DIAGNOSIS — M5126 Other intervertebral disc displacement, lumbar region: Secondary | ICD-10-CM | POA: Diagnosis not present

## 2020-06-08 DIAGNOSIS — R569 Unspecified convulsions: Secondary | ICD-10-CM | POA: Diagnosis not present

## 2020-06-08 DIAGNOSIS — M797 Fibromyalgia: Secondary | ICD-10-CM | POA: Diagnosis not present

## 2020-06-08 DIAGNOSIS — M419 Scoliosis, unspecified: Secondary | ICD-10-CM | POA: Diagnosis not present

## 2020-06-08 DIAGNOSIS — G8929 Other chronic pain: Secondary | ICD-10-CM | POA: Diagnosis not present

## 2020-06-08 DIAGNOSIS — R110 Nausea: Secondary | ICD-10-CM | POA: Diagnosis not present

## 2020-06-08 DIAGNOSIS — M8580 Other specified disorders of bone density and structure, unspecified site: Secondary | ICD-10-CM | POA: Diagnosis not present

## 2020-06-08 DIAGNOSIS — M5441 Lumbago with sciatica, right side: Secondary | ICD-10-CM | POA: Diagnosis not present

## 2020-06-08 DIAGNOSIS — I10 Essential (primary) hypertension: Secondary | ICD-10-CM | POA: Diagnosis not present

## 2020-06-08 DIAGNOSIS — F419 Anxiety disorder, unspecified: Secondary | ICD-10-CM | POA: Diagnosis not present

## 2020-06-08 DIAGNOSIS — M47816 Spondylosis without myelopathy or radiculopathy, lumbar region: Secondary | ICD-10-CM | POA: Diagnosis not present

## 2020-06-08 DIAGNOSIS — M5136 Other intervertebral disc degeneration, lumbar region: Secondary | ICD-10-CM | POA: Diagnosis not present

## 2020-06-11 ENCOUNTER — Other Ambulatory Visit: Payer: Self-pay | Admitting: Internal Medicine

## 2020-06-11 NOTE — Telephone Encounter (Signed)
Last filled 05-01-20 #90 Last OV 06-20-19 Next OV 06-25-20 [facility]

## 2020-06-14 ENCOUNTER — Inpatient Hospital Stay: Payer: Medicare HMO

## 2020-06-17 ENCOUNTER — Inpatient Hospital Stay: Payer: Medicare HMO | Attending: Hematology and Oncology

## 2020-06-17 ENCOUNTER — Other Ambulatory Visit: Payer: Self-pay

## 2020-06-17 VITALS — Wt 90.7 lb

## 2020-06-17 DIAGNOSIS — Z820 Family history of epilepsy and other diseases of the nervous system: Secondary | ICD-10-CM | POA: Diagnosis not present

## 2020-06-17 DIAGNOSIS — K219 Gastro-esophageal reflux disease without esophagitis: Secondary | ICD-10-CM | POA: Diagnosis not present

## 2020-06-17 DIAGNOSIS — D500 Iron deficiency anemia secondary to blood loss (chronic): Secondary | ICD-10-CM

## 2020-06-17 DIAGNOSIS — Z79899 Other long term (current) drug therapy: Secondary | ICD-10-CM | POA: Diagnosis not present

## 2020-06-17 DIAGNOSIS — Z836 Family history of other diseases of the respiratory system: Secondary | ICD-10-CM | POA: Diagnosis not present

## 2020-06-17 DIAGNOSIS — Z8261 Family history of arthritis: Secondary | ICD-10-CM | POA: Insufficient documentation

## 2020-06-17 DIAGNOSIS — Z800 Family history of malignant neoplasm of digestive organs: Secondary | ICD-10-CM | POA: Diagnosis not present

## 2020-06-17 DIAGNOSIS — E876 Hypokalemia: Secondary | ICD-10-CM | POA: Insufficient documentation

## 2020-06-17 DIAGNOSIS — Z833 Family history of diabetes mellitus: Secondary | ICD-10-CM | POA: Diagnosis not present

## 2020-06-17 DIAGNOSIS — Z8049 Family history of malignant neoplasm of other genital organs: Secondary | ICD-10-CM | POA: Insufficient documentation

## 2020-06-17 DIAGNOSIS — Z803 Family history of malignant neoplasm of breast: Secondary | ICD-10-CM | POA: Insufficient documentation

## 2020-06-17 DIAGNOSIS — Z8249 Family history of ischemic heart disease and other diseases of the circulatory system: Secondary | ICD-10-CM | POA: Insufficient documentation

## 2020-06-17 DIAGNOSIS — R911 Solitary pulmonary nodule: Secondary | ICD-10-CM | POA: Insufficient documentation

## 2020-06-17 DIAGNOSIS — E538 Deficiency of other specified B group vitamins: Secondary | ICD-10-CM | POA: Insufficient documentation

## 2020-06-17 DIAGNOSIS — J449 Chronic obstructive pulmonary disease, unspecified: Secondary | ICD-10-CM | POA: Insufficient documentation

## 2020-06-17 DIAGNOSIS — D509 Iron deficiency anemia, unspecified: Secondary | ICD-10-CM | POA: Insufficient documentation

## 2020-06-17 MED ORDER — CYANOCOBALAMIN 1000 MCG/ML IJ SOLN
1000.0000 ug | Freq: Once | INTRAMUSCULAR | Status: AC
  Administered 2020-06-17: 1000 ug via INTRAMUSCULAR

## 2020-06-25 ENCOUNTER — Ambulatory Visit (INDEPENDENT_AMBULATORY_CARE_PROVIDER_SITE_OTHER): Payer: Medicare HMO | Admitting: Internal Medicine

## 2020-06-25 ENCOUNTER — Other Ambulatory Visit: Payer: Self-pay

## 2020-06-25 ENCOUNTER — Encounter: Payer: Self-pay | Admitting: Internal Medicine

## 2020-06-25 VITALS — BP 128/80 | HR 68 | Temp 97.7°F | Ht 59.5 in | Wt 91.0 lb

## 2020-06-25 DIAGNOSIS — E440 Moderate protein-calorie malnutrition: Secondary | ICD-10-CM

## 2020-06-25 DIAGNOSIS — E441 Mild protein-calorie malnutrition: Secondary | ICD-10-CM | POA: Insufficient documentation

## 2020-06-25 DIAGNOSIS — J449 Chronic obstructive pulmonary disease, unspecified: Secondary | ICD-10-CM | POA: Diagnosis not present

## 2020-06-25 DIAGNOSIS — Z0000 Encounter for general adult medical examination without abnormal findings: Secondary | ICD-10-CM | POA: Diagnosis not present

## 2020-06-25 DIAGNOSIS — G40909 Epilepsy, unspecified, not intractable, without status epilepticus: Secondary | ICD-10-CM

## 2020-06-25 DIAGNOSIS — F39 Unspecified mood [affective] disorder: Secondary | ICD-10-CM | POA: Diagnosis not present

## 2020-06-25 DIAGNOSIS — Z7189 Other specified counseling: Secondary | ICD-10-CM

## 2020-06-25 MED ORDER — LAMOTRIGINE 100 MG PO TABS: 100.0000 mg | ORAL_TABLET | Freq: Two times a day (BID) | ORAL | 3 refills | Status: DC

## 2020-06-25 MED ORDER — ALBUTEROL SULFATE HFA 108 (90 BASE) MCG/ACT IN AERS: 2.0000 | INHALATION_SPRAY | Freq: Four times a day (QID) | RESPIRATORY_TRACT | 1 refills | Status: DC | PRN

## 2020-06-25 NOTE — Assessment & Plan Note (Signed)
Extensive work up negative On supplements, counseling, pancreatic enzyme replacement---nothing working

## 2020-06-25 NOTE — Assessment & Plan Note (Signed)
None on the lamictal

## 2020-06-25 NOTE — Assessment & Plan Note (Signed)
Requested DNR today---done

## 2020-06-25 NOTE — Assessment & Plan Note (Signed)
I have personally reviewed the Medicare Annual Wellness questionnaire and have noted 1. The patient's medical and social history 2. Their use of alcohol, tobacco or illicit drugs 3. Their current medications and supplements 4. The patient's functional ability including ADL's, fall risks, home safety risks and hearing or visual             impairment. 5. Diet and physical activities 6. Evidence for depression or mood disorders  The patients weight, height, BMI and visual acuity have been recorded in the chart I have made referrals, counseling and provided education to the patient based review of the above and I have provided the pt with a written personalized care plan for preventive services.  I have provided you with a copy of your personalized plan for preventive services. Please take the time to review along with your updated medication list.  Colon due again next year Yearly mammogram and surgeon evaluation Doesn't want COVID vaccine---counseled Prefers no flu vaccines

## 2020-06-25 NOTE — Assessment & Plan Note (Signed)
Breathing is worse Will set up with pulmonary for evaluation

## 2020-06-25 NOTE — Assessment & Plan Note (Signed)
Chronic anxiety Episodic depressed mood Okay on paroxetine and alprazolam

## 2020-06-25 NOTE — Progress Notes (Signed)
Hearing Screening   125Hz  250Hz  500Hz  1000Hz  2000Hz  3000Hz  4000Hz  6000Hz  8000Hz   Right ear:           Left ear:           Comments: January 2021  Vision Screening Comments: May 2021

## 2020-06-25 NOTE — Progress Notes (Signed)
Subjective:    Patient ID: Molly Peters, female    DOB: June 28, 1961, 59 y.o.   MRN: [id number]  HPI Here for Medicare wellness visit and follow up of chronic health conditions This visit occurred during the SARS-CoV-2 public health emergency.  Safety protocols were in place, including screening questions prior to the visit, additional usage of staff PPE, and extensive cleaning of exam room while observing appropriate contact time as indicated for disinfecting solutions.   Reviewed form and advanced directives Reviewed other doctors Rare beer Still smoking Vision is okay--new prescription Does a little exercise Hearing is fine Has some falls--due to dogs. Pit bull wrapped chain around her foot and took off (no longer walks them) Chronic mood problems Does instrumental ADLs but not all Memory is not good---no sig change  Chronic anemia Gets iron infusions, B12----hemoglobin last back to normal Has had evaluation due to weight loss Nothing found Uses boost/sees nutritionist EGD/colon done Empiric Rx with pancreatic enzymes---not helping Feels "tired"  Recent ER visit Bad pain in back, right hip and groin for 3-4 days MRI done and no worrisome findings (got some oxycodone)  No seizures Still on lamictal  Chronic anxiety Paroxetine daily and bid alprazolam regularly Intermittent down times--family stress, etc. Nothing persistent Not anhedonic  Breathing somewhat worse Constant cough---productive Still smoking---not ready to stop  Has had some chest pain--nothing recent Some palpitations--at rest Some dizziness-no syncope No edema  Current Outpatient Medications on File Prior to Visit  Medication Sig Dispense Refill  . albuterol (VENTOLIN HFA) 108 (90 Base) MCG/ACT inhaler Inhale 2 puffs into the lungs every 6 (six) hours as needed for wheezing or shortness of breath.    . ALPRAZolam (XANAX) 1 MG tablet TAKE 1/2 TO 1 TABLET BY MOUTH 3 TIMES DAILY AS NEEDED  ANXIETY 90 tablet 0  . lamoTRIgine (LAMICTAL) 100 MG tablet TAKE 1 TABLET BY MOUTH TWICE DAILY 180 tablet 3  . latanoprost (XALATAN) 0.005 % ophthalmic solution     . losartan-hydrochlorothiazide (HYZAAR) 100-12.5 MG tablet Take 1 tablet by mouth daily. 90 tablet 3  . methocarbamol (ROBAXIN) 500 MG tablet     . omeprazole (PRILOSEC) 40 MG capsule TAKE 1 CAPSULE BY MOUTH TWICE DAILY BEFORE MEALS 60 capsule 1  . ondansetron (ZOFRAN ODT) 4 MG disintegrating tablet Take 1 tablet (4 mg total) by mouth every 8 (eight) hours as needed for nausea or vomiting. 20 tablet 0  . oxyCODONE (OXY IR/ROXICODONE) 5 MG immediate release tablet     . Pancrelipase, Lip-Prot-Amyl, (ZENPEP) 40000-126000 units CPEP Take 2 capsules with the first bite of each meal and 1 capsule with the first bite of each snack 240 capsule 1  . PARoxetine (PAXIL) 20 MG tablet TAKE 1 TABLET BY MOUTH ONCE DAILY 90 tablet 3   No current facility-administered medications on file prior to visit.    Allergies  Allergen Reactions  . Carbamazepine Hives  . Divalproex Sodium Swelling and Other (See Comments)    HAIR FALLS OUT  . Clonazepam Other (See Comments)    HALLUCINATIONS  . Diphenhydramine Hcl Rash  . Tiotropium Bromide Monohydrate Other (See Comments)    CREATES YEAST IN THROAT  . Vicodin [Hydrocodone-Acetaminophen] Itching    Past Medical History:  Diagnosis Date  . Anxiety   . Cervical cancer ([place]) 1980's  . Chronic back pain 11/12/2009   Qualifier: Diagnosis of  By: Maxie Better FNP, Rosalita Levan   . COPD (chronic obstructive pulmonary disease) (HCC)    states SOB with  ADLs; no O2 use; able to speak in complete sentences without SOB(11/15/2014)  . Depression   . Difficulty swallowing pills    s/p cervical fusion  . Family history of adverse reaction to anesthesia    pt's mother and sister have hx. of post-op N/V  . Fibromyalgia   . GERD (gastroesophageal reflux disease)   . Heart murmur   . History of gastric ulcer     . Hypertension    states under control with med., has been on med. x 1-2 yr.  . IBS (irritable bowel syndrome)   . Internal hemorrhoid    states has had intermittent bright red bleeding with BM (11/15/2014)  . Intraductal papilloma of breast, right 08/08/2018  . Limited joint range of motion    neck - s/p cervical fusion  . Localized primary osteoarthritis of right shoulder region 11/23/2014  . Osteoarthritis of left shoulder 07/05/2015  . Osteoarthritis of right shoulder 11/2014  . Pneumonia   . Seizures ([place])    last seizure 2010  . Short-term memory loss   . TMJ syndrome   . Valvular heart disease    Initial workup a number of years ago at [facility].  Echo (10/2009) showed EF 60-65%,      normal LV size, moderate aortic insufficiency with a trileaflet aortic valve and normal aortic root size.  Mild      mitral regurgitation.   . Wears dentures    lower    Past Surgical History:  Procedure Laterality Date  . ABDOMINAL HYSTERECTOMY     partial  . ANTERIOR CERVICAL DECOMP/DISCECTOMY FUSION  02/06/2011   C4-5, C5-6, C6-7  . BREAST BIOPSY Bilateral 10+ yrs ago   neg  . BREAST BIOPSY Right 08/03/2018   2 areas bx, -neg  . BREAST LUMPECTOMY Bilateral 1989   x 3 - benign  . BREAST LUMPECTOMY Right 08/24/2018   Procedure: BREAST LUMPECTOMY WITH ULTRASOUND IN OR;  Surgeon: Vickie Epley, MD;  Location: [facility] ORS;  Service: General;  Laterality: Right;  . CARDIAC CATHETERIZATION  1995  . CESAREAN SECTION     x 2  . COLONOSCOPY  09/28/2008  . COLONOSCOPY N/A 06/15/2018   Procedure: COLONOSCOPY;  Surgeon: Virgel Manifold, MD;  Location: [facility] ENDOSCOPY;  Service: Endoscopy;  Laterality: N/A;  . COLONOSCOPY WITH PROPOFOL N/A 05/04/2019   Procedure: COLONOSCOPY WITH PROPOFOL;  Surgeon: Virgel Manifold, MD;  Location: [facility] ENDOSCOPY;  Service: Endoscopy;  Laterality: N/A;  . ESOPHAGOGASTRODUODENOSCOPY  09/28/2008  . ESOPHAGOGASTRODUODENOSCOPY N/A 06/14/2018   Procedure:  ESOPHAGOGASTRODUODENOSCOPY (EGD);  Surgeon: Virgel Manifold, MD;  Location[facility] ENDOSCOPY;  Service: Endoscopy;  Laterality: N/A;  . ESOPHAGOGASTRODUODENOSCOPY (EGD) WITH PROPOFOL N/A 05/04/2019   Procedure: ESOPHAGOGASTRODUODENOSCOPY (EGD) WITH PROPOFOL;  Surgeon: Virgel Manifold, MD;  Location: [facility] ENDOSCOPY;  Service: Endoscopy;  Laterality: N/A;  . ESOPHAGOGASTRODUODENOSCOPY (EGD) WITH PROPOFOL N/A 03/18/2020   Procedure: ESOPHAGOGASTRODUODENOSCOPY (EGD) WITH PROPOFOL;  Surgeon: Lin Landsman, MD;  Location: [facility] ENDOSCOPY;  Service: Gastroenterology;  Laterality: N/A;  . HARDWARE REMOVAL  11/17/2006   L5-S1  . LAMINECTOMY WITH POSTERIOR LATERAL ARTHRODESIS LEVEL 3  12/06/2009   with synovial cyst resection L3-4 bilat.  Marland Kitchen LAMINECTOMY WITH POSTERIOR LATERAL ARTHRODESIS LEVEL 3  11/17/2006   L4-5  . NM MYOVIEW LTD     Lexiscan myoview (10/2009): EF 77%, normal wall motion, normal perfusion.   Marland Kitchen SHOULDER ARTHROSCOPY WITH DEBRIDEMENT AND BICEP TENDON REPAIR Right 04/13/2014   Procedure: RIGHT SHOULDER ARTHROSCOPY WITH DEBRIDEMENT EXTENSIVE;  Surgeon: Johnny Bridge, MD;  Location: [place];  Service: Orthopedics;  Laterality: Right;  . TOTAL SHOULDER ARTHROPLASTY Right 11/23/2014   Procedure: TOTAL RIGHT SHOULDER ARTHROPLASTY;  Surgeon: Johnny Bridge, MD;  Location: [place];  Service: Orthopedics;  Laterality: Right;  . TOTAL SHOULDER ARTHROPLASTY Left 07/05/2015   Procedure: LEFT TOTAL SHOULDER REPLACEMENT;  Surgeon: Marchia Bond, MD;  Location: [place];  Service: Orthopedics;  Laterality: Left;    Family History  Problem Relation Age of Onset  . Cervical cancer Mother   . Anesthesia problems Mother        post-op N/V  . Rheum arthritis Mother   . Anxiety disorder Mother   . Cancer Father        colon cancer  . Migraines Sister   . Cervical cancer Sister   . Anesthesia problems Sister        post-op N/V  . Migraines  Brother   . Asthma Daughter   . Cerebral palsy Daughter        age 60  . Coronary artery disease Maternal Grandmother   . Hypertension Maternal Grandmother   . Diabetes Maternal Grandmother   . Coronary artery disease Paternal Grandmother   . Hypertension Paternal Grandmother   . Diabetes Paternal Grandmother   . Breast cancer Maternal Aunt   . Breast cancer Maternal Aunt   . Breast cancer Maternal Aunt     Social History   Socioeconomic History  . Marital status: Married    Spouse name: Not on file  . Number of children: 3  . Years of education: Not on file  . Highest education level: Not on file  Occupational History  . Occupation: Disabled 2003  Tobacco Use  . Smoking status: Current Every Day Smoker    Packs/day: 1.00    Years: 42.75    Pack years: 42.75    Types: Cigarettes  . Smokeless tobacco: Never Used  Vaping Use  . Vaping Use: Never used  Substance and Sexual Activity  . Alcohol use: Yes    Alcohol/week: 0.0 standard drinks    Comment: rare  . Drug use: Yes    Types: Marijuana    Comment: per pt she smoke thc at bedtime and prn  ( for pain relief and to help sleep since not given percocet anymore )-per pt   . Sexual activity: Not on file  Other Topics Concern  . Not on file  Social History Narrative   No living will   Husband, then oldest son Roosvelt Harps, should make decisions   Requests DNR--done 06/25/20   No tube feeds if cognitively unaware   Social Determinants of Health   Financial Resource Strain:   . Difficulty of Paying Living Expenses:   Food Insecurity:   . Worried About Charity fundraiser in the Last Year:   . Arboriculturist in the Last Year:   Transportation Needs:   . Film/video editor (Medical):   Marland Kitchen Lack of Transportation (Non-Medical):   Physical Activity:   . Days of Exercise per Week:   . Minutes of Exercise per Session:   Stress:   . Feeling of Stress :   Social Connections:   . Frequency of Communication with  Friends and Family:   . Frequency of Social Gatherings with Friends and Family:   . Attends Religious Services:   . Active Member of Clubs or Organizations:   . Attends Archivist  Meetings:   Marland Kitchen Marital Status:   Intimate Partner Violence:   . Fear of Current or Ex-Partner:   . Emotionally Abused:   Marland Kitchen Physically Abused:   . Sexually Abused:    Review of Systems Sleep is variable--some restless nights Wear seat belt Lots of teeth repairs lately No skin lesions of concern Bowels variable---constipation and diarrhea. Still some blood. Occasional incontinence Voids okay--no incontinence No heartburn on PPI---slight dysphagia    Objective:   Physical Exam Constitutional:      Comments: Clear wasting  HENT:     Mouth/Throat:     Comments: No lesions Eyes:     Conjunctiva/sclera: Conjunctivae normal.     Pupils: Pupils are equal, round, and reactive to light.  Cardiovascular:     Rate and Rhythm: Normal rate and regular rhythm.     Heart sounds: No murmur heard.  No gallop.      Comments: Faint pedal pulses Pulmonary:     Effort: Pulmonary effort is normal.     Breath sounds: No wheezing or rales.     Comments: Decreased breath sounds but clear Abdominal:     Palpations: Abdomen is soft.     Tenderness: There is no abdominal tenderness.  Musculoskeletal:     Cervical back: Neck supple.     Right lower leg: No edema.     Left lower leg: No edema.  Lymphadenopathy:     Cervical: No cervical adenopathy.  Skin:    Findings: No rash.  Neurological:     Mental Status: She is alert and oriented to person, place, and time.     Comments: President--- "Biden, Trump, OBama" 100-93-eighty...Marland KitchenMarland Kitchen. D-l-r-o-w Recall 3/3  Psychiatric:        Mood and Affect: Mood normal.        Behavior: Behavior normal.            Assessment & Plan:

## 2020-07-01 ENCOUNTER — Inpatient Hospital Stay: Payer: Medicare HMO | Attending: Hematology and Oncology

## 2020-07-01 NOTE — Progress Notes (Signed)
Nutrition Follow-up:  Patient with iron deficiency anemia, Vitamin B 12 deficiency and unexplained weight loss.  Patient follows with Dr. Mike Gip.  Noted PET no evidenced of malignancy per MD note.  Patient has been seen by GI.  Spoke with patient via phone for nutrition follow-up.  Patient reports appetite is about the same.  Reports that she eats regular meals and snacks during the day.  Has tried boost, ensure and orgain shakes and is able to drink them.  Reports stools are more solid now vs liquid.  Has not followed up with GI.  Continues taking pancreatic enzymes   Medications: zenpep, prilosec, zofran  Labs: reviewed  Anthropometrics:   Weight 91 lb on 8/17  91 lb 7 oz on 7/19 90 lb 11.5 oz on 7/9 92 lb 3 oz on 6/21   NUTRITION DIAGNOSIS: Inadequate oral intake stable with stable weight   INTERVENTION:  Reviewed with patient again daily calorie goal.  Encouraged patient to keep food diary and record number of calories eaten and compare to goal.  Strongly encouraged follow-up by GI to continue work up. Encouraged high calorie shakes for additional calories to prevent further weight loss Patient has contact information and will contact RD if needed in the future   NEXT VISIT: no follow-up Patient to contact RD if needed in the future  Aika Brzoska B. Zenia Resides, [place], [place] Registered Dietitian [phone] (mobile)

## 2020-07-12 ENCOUNTER — Inpatient Hospital Stay: Payer: Medicare HMO

## 2020-07-16 ENCOUNTER — Other Ambulatory Visit: Payer: Self-pay

## 2020-07-16 ENCOUNTER — Inpatient Hospital Stay: Payer: Medicare HMO

## 2020-07-30 ENCOUNTER — Other Ambulatory Visit: Payer: Self-pay | Admitting: Internal Medicine

## 2020-07-30 NOTE — Telephone Encounter (Signed)
Last filled 06-11-20 #90 Last OV 06-25-20 Next OV 06-30-21 [facility]

## 2020-08-02 ENCOUNTER — Other Ambulatory Visit: Payer: Self-pay | Admitting: Gastroenterology

## 2020-08-11 NOTE — Progress Notes (Signed)
[facility]  [address] [place], [place] [address] Phone: [phone]  Fax: [phone]   Clinic Day:  08/12/2020  Referring physician: Venia Carbon, MD  Chief Complaint: Molly Peters is a 59 y.o. female with iron deficiency anemia, B12 deficiency, andunexplained weight loss who is seen for 3 month assessment.  HPI: The patient was last seen in the hematology clinic on 05/17/2020.  At that time, she was hoarse.  She noted interval rectal bleeding. Weight was stable. BMP was normal. She was referred to ENT and was to follow up with Dr. Bonna Gains. She received a Vitamin B12 injection.  The patient spoke to Jennet Maduro RD on 05/27/2020. She reported that her appetite was up and down. She was provided Ensure and Boost samples. She was to call her GI physician that afternoon for a follow up appointment.  She spoke to Jennet Maduro RD on 07/01/2020. She had not followed up with GI. Her appetite was stable. She continued taking pancreatic enzymes.  She received a Vitamin B12 injection on 06/17/2020.  During the interim, she has been fine. She has not had any additional episodes of rectal bleeding. She has diarrhea today. She has not taken her pancreatic enzymes in 4 days because she ran out and cannot afford any more. She has not followed up with GI.  She sees Dr. Marius Ditch.  The patient has been tired and taking naps over the past week. Her shortness of breath and back pain are stable. She is trying to eat better. Her neck pain has improved.  She saw Dr. Richardson Landry about her hoarse voice; he said that "everything was fine."   Past Medical History:  Diagnosis Date  . Anxiety   . Cervical cancer ([place]) 1980's  . Chronic back pain 11/12/2009   Qualifier: Diagnosis of  By: Maxie Better FNP, Rosalita Levan   . COPD (chronic obstructive pulmonary disease) (HCC)    states SOB with ADLs; no O2 use; able to speak in complete sentences without SOB(11/15/2014)  .  Depression   . Difficulty swallowing pills    s/p cervical fusion  . Family history of adverse reaction to anesthesia    pt's mother and sister have hx. of post-op N/V  . Fibromyalgia   . GERD (gastroesophageal reflux disease)   . Heart murmur   . History of gastric ulcer   . Hypertension    states under control with med., has been on med. x 1-2 yr.  . IBS (irritable bowel syndrome)   . Internal hemorrhoid    states has had intermittent bright red bleeding with BM (11/15/2014)  . Intraductal papilloma of breast, right 08/08/2018  . Limited joint range of motion    neck - s/p cervical fusion  . Localized primary osteoarthritis of right shoulder region 11/23/2014  . Osteoarthritis of left shoulder 07/05/2015  . Osteoarthritis of right shoulder 11/2014  . Pneumonia   . Seizures ([place])    last seizure 2010  . Short-term memory loss   . TMJ syndrome   . Valvular heart disease    Initial work[facility] a number of years ago [facility].  Echo (10/2009) showed EF 60-65%,      normal LV size, moderate aortic insufficiency with a trileaflet aortic valve and normal aortic root size.  Mild      mitral regurgitation.   . Wears dentures    lower    Past Surgical History:  Procedure Laterality Date  . ABDOMINAL HYSTERECTOMY  partial  . ANTERIOR CERVICAL DECOMP/DISCECTOMY FUSION  02/06/2011   C4-5, C5-6, C6-7  . BREAST BIOPSY Bilateral 10+ yrs ago   neg  . BREAST BIOPSY Right 08/03/2018   2 areas bx, -neg  . BREAST LUMPECTOMY Bilateral 1989   x 3 - benign  . BREAST LUMPECTOMY Right 08/24/2018   Procedure: BREAST LUMPECTOMY WITH ULTRASOUND IN OR;  Surgeon: Vickie Epley, MD;  Location: [facility] ORS;  Service: General;  Laterality: Right;  . CARDIAC CATHETERIZATION  1995  . CESAREAN SECTION     x 2  . COLONOSCOPY  09/28/2008  . COLONOSCOPY N/A 06/15/2018   Procedure: COLONOSCOPY;  Surgeon: Virgel Manifold, MD;  Location: [facility] ENDOSCOPY;  Service: Endoscopy;  Laterality: N/A;  . COLONOSCOPY WITH  PROPOFOL N/A 05/04/2019   Procedure: COLONOSCOPY WITH PROPOFOL;  Surgeon: Virgel Manifold, MD;  Location: [facility] ENDOSCOPY;  Service: Endoscopy;  Laterality: N/A;  . ESOPHAGOGASTRODUODENOSCOPY  09/28/2008  . ESOPHAGOGASTRODUODENOSCOPY N/A 06/14/2018   Procedure: ESOPHAGOGASTRODUODENOSCOPY (EGD);  Surgeon: Virgel Manifold, MD;  Location: [facility] ENDOSCOPY;  Service: Endoscopy;  Laterality: N/A;  . ESOPHAGOGASTRODUODENOSCOPY (EGD) WITH PROPOFOL N/A 05/04/2019   Procedure: ESOPHAGOGASTRODUODENOSCOPY (EGD) WITH PROPOFOL;  Surgeon: Virgel Manifold, MD;  Location: [facility] ENDOSCOPY;  Service: Endoscopy;  Laterality: N/A;  . ESOPHAGOGASTRODUODENOSCOPY (EGD) WITH PROPOFOL N/A 03/18/2020   Procedure: ESOPHAGOGASTRODUODENOSCOPY (EGD) WITH PROPOFOL;  Surgeon: Lin Landsman, MD;  Location: [facility] ENDOSCOPY;  Service: Gastroenterology;  Laterality: N/A;  . HARDWARE REMOVAL  11/17/2006   L5-S1  . LAMINECTOMY WITH POSTERIOR LATERAL ARTHRODESIS LEVEL 3  12/06/2009   with synovial cyst resection L3-4 bilat.  Marland Kitchen LAMINECTOMY WITH POSTERIOR LATERAL ARTHRODESIS LEVEL 3  11/17/2006   L4-5  . NM MYOVIEW LTD     Lexiscan myoview (10/2009): EF 77%, normal wall motion, normal perfusion.   Marland Kitchen SHOULDER ARTHROSCOPY WITH DEBRIDEMENT AND BICEP TENDON REPAIR Right 04/13/2014   Procedure: RIGHT SHOULDER ARTHROSCOPY WITH DEBRIDEMENT EXTENSIVE;  Surgeon: Johnny Bridge, MD;  Location: [facility];  Service: Orthopedics;  Laterality: Right;  . TOTAL SHOULDER ARTHROPLASTY Right 11/23/2014   Procedure: TOTAL RIGHT SHOULDER ARTHROPLASTY;  Surgeon: Johnny Bridge, MD;  Location: [facility];  Service: Orthopedics;  Laterality: Right;  . TOTAL SHOULDER ARTHROPLASTY Left 07/05/2015   Procedure: LEFT TOTAL SHOULDER REPLACEMENT;  Surgeon: Marchia Bond, MD;  Location: [facility];  Service: Orthopedics;  Laterality: Left;    Family History  Problem Relation Age of Onset  . Cervical cancer  Mother   . Anesthesia problems Mother        post-op N/V  . Rheum arthritis Mother   . Anxiety disorder Mother   . Cancer Father        colon cancer  . Migraines Sister   . Cervical cancer Sister   . Anesthesia problems Sister        post-op N/V  . Migraines Brother   . Asthma Daughter   . Cerebral palsy Daughter        age 46  . Coronary artery disease Maternal Grandmother   . Hypertension Maternal Grandmother   . Diabetes Maternal Grandmother   . Coronary artery disease Paternal Grandmother   . Hypertension Paternal Grandmother   . Diabetes Paternal Grandmother   . Breast cancer Maternal Aunt   . Breast cancer Maternal Aunt   . Breast cancer Maternal Aunt   . Colon cancer Paternal Uncle   . Lung cancer Paternal Uncle     Social History:  reports that she has been smoking cigarettes. She has a 42.75 pack-year smoking history. She has never used smokeless tobacco. She reports current alcohol use. She reports current drug use. Drug: Marijuana. She is currently smoking half a pack a day.She drinks "a hair" every now and then. Patient has been on disability in 2005 for "ruptured back". She is a former [employer]. The patient is alone today.  Allergies:  Allergies  Allergen Reactions  . Carbamazepine Hives  . Divalproex Sodium Swelling and Other (See Comments)    HAIR FALLS OUT  . Clonazepam Other (See Comments)    HALLUCINATIONS  . Diphenhydramine Hcl Rash  . Tiotropium Bromide Monohydrate Other (See Comments)    CREATES YEAST IN THROAT  . Vicodin [Hydrocodone-Acetaminophen] Itching    Current Medications: Current Outpatient Medications  Medication Sig Dispense Refill  . albuterol (VENTOLIN HFA) 108 (90 Base) MCG/ACT inhaler Inhale 2 puffs into the lungs every 6 (six) hours as needed for wheezing or shortness of breath. 6 g 1  . ALPRAZolam (XANAX) 1 MG tablet TAKE 1/2 TO 1 TABLET BY MOUTH 3 TIMES DAILY AS NEEDED ANXIETY 90 tablet 0  . lamoTRIgine (LAMICTAL) 100 MG tablet Take  1 tablet (100 mg total) by mouth 2 (two) times daily. 180 tablet 3  . losartan-hydrochlorothiazide (HYZAAR) 100-12.5 MG tablet Take 1 tablet by mouth daily. 90 tablet 3  . methocarbamol (ROBAXIN) 500 MG tablet Take 500 mg by mouth every 6 (six) hours as needed.     Marland Kitchen omeprazole (PRILOSEC) 40 MG capsule TAKE 1 CAPSULE BY MOUTH TWICE DAILY BEFORE MEALS 60 capsule 1  . ondansetron (ZOFRAN ODT) 4 MG disintegrating tablet Take 1 tablet (4 mg total) by mouth every 8 (eight) hours as needed for nausea or vomiting. 20 tablet 0  . oxyCODONE (OXY IR/ROXICODONE) 5 MG immediate release tablet Take 5 mg by mouth every 6 (six) hours as needed.     Marland Kitchen PARoxetine (PAXIL) 20 MG tablet TAKE 1 TABLET BY MOUTH ONCE DAILY 90 tablet 3  . latanoprost (XALATAN) 0.005 % ophthalmic solution     . Pancrelipase, Lip-Prot-Amyl, (ZENPEP) 40000-126000 units CPEP TAKE 2 CAPSULES BY MOUTH EACH MEAL AND 1CAPSULE WITH [facility] (Patient not taking: Reported on 08/12/2020) 240 capsule 1   No current facility-administered medications for this visit.   Facility-Administered Medications Ordered in Other Visits  Medication Dose Route Frequency Provider Last Rate Last Admin  . cyanocobalamin ((VITAMIN B-12)) injection 1,000 mcg  1,000 mcg Intramuscular Once Lequita Asal, MD        Review of Systems  Constitutional: Positive for malaise/fatigue (takes naps). Negative for chills, diaphoresis, fever and weight loss (up 3 lbs).  HENT: Negative for congestion, ear discharge, ear pain, hearing loss, nosebleeds, sinus pain, sore throat and tinnitus.        Hoarse voice.  Eyes: Negative for blurred vision.  Respiratory: Positive for shortness of breath. Negative for cough, hemoptysis and sputum production.   Cardiovascular: Negative.  Negative for chest pain, palpitations and leg swelling.  Gastrointestinal: Positive for diarrhea (today). Negative for abdominal pain, blood in stool, constipation, heartburn, melena, nausea (on Zofran)  and vomiting.  Genitourinary: Negative.  Negative for dysuria, frequency, hematuria and urgency.  Musculoskeletal: Positive for back pain and neck pain (LEFT side; pain increases with movement, improved). Negative for joint pain and myalgias.  Skin: Negative.  Negative for itching and rash.  Neurological: Negative for dizziness, tingling, sensory change, weakness and headaches.  Endo/Heme/Allergies: Does not bruise/bleed easily.  Psychiatric/Behavioral: Negative for depression and memory loss. The patient is not nervous/anxious and does not have insomnia.   All other systems reviewed and are negative.   Performance status (ECOG): 1  Vitals Blood pressure (!) 146/84, pulse 68, temperature 98.4 F (36.9 C), temperature source Tympanic, weight 93 lb 5.8 oz (42.4 kg), SpO2 98 %.   Physical Exam Vitals and nursing note reviewed.  Constitutional:      General: She is not in acute distress.    Appearance: She is well-developed. She is not diaphoretic.     Comments: Thin woman sitting comfortably in the exam room in no acute distress.   HENT:     Head: Normocephalic and atraumatic.     Comments: Long graying hair.    Mouth/Throat:     Pharynx: No oropharyngeal exudate.  Eyes:     General: No scleral icterus.    Conjunctiva/sclera: Conjunctivae normal.     Pupils: Pupils are equal, round, and reactive to light.     Comments: Glasses.  Blue eyes.   Cardiovascular:     Rate and Rhythm: Normal rate and regular rhythm.     Heart sounds: Normal heart sounds. No murmur heard.   Pulmonary:     Effort: Pulmonary effort is normal. No respiratory distress.     Breath sounds: Normal breath sounds. No wheezing or rales.  Abdominal:     General: Bowel sounds are normal. There is no distension.     Palpations: Abdomen is soft. There is no hepatomegaly, splenomegaly or mass.     Tenderness: There is no abdominal tenderness. There is no guarding or rebound.  Musculoskeletal:        General: No  tenderness. Normal range of motion.     Cervical back: Normal range of motion and neck supple.  Lymphadenopathy:     Head:     Right side of head: No preauricular, posterior auricular or occipital adenopathy.     Left side of head: No preauricular, posterior auricular or occipital adenopathy.     Cervical: No cervical adenopathy.     Upper Body:     Right upper body: No supraclavicular or axillary adenopathy.     Left upper body: No supraclavicular or axillary adenopathy.     Lower Body: No right inguinal adenopathy. No left inguinal adenopathy.  Skin:    General: Skin is warm and dry.  Neurological:     Mental Status: She is alert and oriented to person, place, and time.  Psychiatric:        Behavior: Behavior normal.        Thought Content: Thought content normal.        Judgment: Judgment normal.    Appointment on 08/12/2020  Component Date Value Ref Range Status  . Sodium 08/12/2020 136  135 - 145 mmol/L Final  . Potassium 08/12/2020 3.2* 3.5 - 5.1 mmol/L Final  . Chloride 08/12/2020 102  98 - 111 mmol/L Final  . CO2 08/12/2020 26  22 - 32 mmol/L Final  . Glucose, Bld 08/12/2020 105* 70 - 99 mg/dL Final   Glucose reference range applies only to samples taken after fasting for at least 8 hours.  . BUN 08/12/2020 20  6 - 20 mg/dL Final  . Creatinine, Ser 08/12/2020 0.75  0.44 - 1.00 mg/dL Final  . Calcium 08/12/2020 9.0  8.9 - 10.3 mg/dL Final  . Total Protein 08/12/2020 7.4  6.5 - 8.1 g/dL Final  . Albumin 08/12/2020 4.2  3.5 - 5.0  g/dL Final  . AST 08/12/2020 24  15 - 41 U/L Final  . ALT 08/12/2020 12  0 - 44 U/L Final  . Alkaline Phosphatase 08/12/2020 65  38 - 126 U/L Final  . Total Bilirubin 08/12/2020 0.7  0.3 - 1.2 mg/dL Final  . GFR calc non Af Amer 08/12/2020 >60  >60 mL/min Final  . GFR calc Af Amer 08/12/2020 >60  >60 mL/min Final  . Anion gap 08/12/2020 8  5 - 15 Final   Performed at [facility], [address] [place], [place] [address]  . WBC  08/12/2020 7.0  4.0 - 10.5 K/uL Final  . RBC 08/12/2020 4.32  3.87 - 5.11 MIL/uL Final  . Hemoglobin 08/12/2020 14.3  12.0 - 15.0 g/dL Final  . HCT 08/12/2020 40.6  36 - 46 % Final  . MCV 08/12/2020 94.0  80.0 - 100.0 fL Final  . MCH 08/12/2020 33.1  26.0 - 34.0 pg Final  . MCHC 08/12/2020 35.2  30.0 - 36.0 g/dL Final  . RDW 08/12/2020 12.4  11.5 - 15.5 % Final  . Platelets 08/12/2020 312  150 - 400 K/uL Final  . nRBC 08/12/2020 0.0  0.0 - 0.2 % Final  . Neutrophils Relative % 08/12/2020 45  % Final  . Neutro Abs 08/12/2020 3.1  1.7 - 7.7 K/uL Final  . Lymphocytes Relative 08/12/2020 45  % Final  . Lymphs Abs 08/12/2020 3.1  0.7 - 4.0 K/uL Final  . Monocytes Relative 08/12/2020 8  % Final  . Monocytes Absolute 08/12/2020 0.5  0 - 1 K/uL Final  . Eosinophils Relative 08/12/2020 1  % Final  . Eosinophils Absolute 08/12/2020 0.1  0 - 0 K/uL Final  . Basophils Relative 08/12/2020 1  % Final  . Basophils Absolute 08/12/2020 0.1  0 - 0 K/uL Final  . Immature Granulocytes 08/12/2020 0  % Final  . Abs Immature Granulocytes 08/12/2020 0.01  0.00 - 0.07 K/uL Final   Performed at [facility], [address], [place], [place] [address]    Assessment:  Molly Peters is a 59 y.o. female withiron deficiency anemiasecondary to an upper GI bleed. She presented with intermittent melena and chest pain. Hemoglobin nadir was 6.9 on 06/14/2018.Ferritin was 9, iron saturation 11% and TIBC 393 on 06/20/2018. She received 1 unit of PRBCs.  Work-up on 08/27/2019revealed a hematocrit of 31, hemoglobin 10.2, and MCV 70.8. Ferritin was 7. B12 was 165 (low). Folate was 6.3 (>5.9). TSH was 1.256.  Ferritinhas been followed: 9 on 06/20/2018, 7 on 07/05/2018, 5 on 07/19/2018, 81 on 09/12/2018, 12 on 10/24/2018, 71 on 02/07/2019, 51 on 05/09/2019, 32 on 07/28/2019, 29 on 08/28/2019, 15 on 01/24/2020, 36 on 04/18/2020, and 21 on 08/12/2020.   She received Venofer200 mg on 07/25/2018,  weekly x 3 (08/15/2018 - 08/29/2018), weekly x 2 (01/10/2019 - 01/17/2019), and03/19/2021.  She has B12 deficiency. She began oral B12on 07/06/2018. B12 was 165 on 07/05/2018, 396 on 07/19/2018, 421 (after B12 injection) on 09/12/2018, and 246 on 10/24/2018 (on oral B12). Folatewas 6.3 on 07/05/2018 and 9.2 on 09/12/2018. She began monthly B12on 08/29/2018 (last08/07/2020).  EGDon 06/14/2018 revealed esophageal mucosal changes c/w short segment of Barrett's and a non-bleeding gastric ulcers. Pathology at the GE junction revealed squamocolumnar mucosa with moderate chronic inflammation. Gastric biopsy revealed no H pylori, dysplasia, or malignancy. EGDon 05/04/2019 revealed non-bleeding gastric ulcer with a clean ulcer base (Forrest Class III)and friable gastric mucosa. Biopsies revealed antral mild reactive gastropathy  with no H pylori, metaplasia, dysplasia or malignancy.  Colonoscopyon 06/15/2018 revealed a 5 mm polyp in the sigmoid colon (tubular adenoma). Colonoscopyon 05/04/2019 revealed three 4-5 mm polyps in the sigmoid colon (hyperplastic polyps) and diverticulosis in the sigmoid colon.   Chest CT angiogramon 06/22/2018 reveled no pulmonary embolism. There was underlying emphysematous changes. There were small LUL pulmonary nodular changes (largest 3 mm). There was no thoracic adenopathy.   CT angiogramof the abdomen and pelvison 09/30/2019revealed no evidence of abdominal aortic aneurysm or dissection. No findings to suggest active GI bleeding on CT. There was non-vascular wall thickening involving the transverse colon extending to the splenic flexure, suspicious for infectious/inflammatory colitis. There was associated trace pelvic ascites. There was no pneumatosis or free air. C diff and GI panel by PCR was negative. She was treated with Flagyl and Augmentin.  Chest, abdomen and pelvis CT on 04/12/02021 revealed no evidence of malignancy within the chest,  abdomen or pelvis to account for patient's weight loss.  There were no acute findings.  There were a few tiny bilateral pulmonary nodules none greater than 3 mm in size without significant change from the prior exam.  There was mild uniform thickening of the gastric antrum which may be secondary to peristalsis/spasm, although gastritis/antritis was possible.  PET scan on 05/15/2020 revealed no definite signs of malignancy in the neck, chest, abdomen or pelvis. There was an equivocal area of variable uptake with some fullness in the LEFT glossotonsillar region and parapharyngeal region. This could relate to prior spinal surgery and apparent uptake pattern could be affected by the abundant artifact from cervical hardware, dental hardware and bilateral shoulder arthroplasty changes.  BILATERAL mammogramon 07/27/2018 revealed 2 indeterminate masses in the RIGHT breast at the 2:30 and 3 o'clock positions. Follow up breast ultrasoundsshows a 6 x 4 x 5 mm oval hypoechoic mass, 1 cm from the nipple, in the 2:30 position. In the 3 o'clock position, again 1 cm from the nipple, the was a 6 x 3 x 3 mm oval hypoechoic mass. LEFT breast ultrasound showed normal fibroglandular tissue at the 10 o'clock position. Ultrasound guided needle biopsy with clip placement on 08/03/2018. Pathology revealed revealed a dilated duct/cyst with sclerosis of the wall in the 2:30 lesion. Sample negative for atypia and malignancy. Lesion in the 3 o'clock position revealed an intraductal papilloma. Samplewasnegative for atypia and malignancy.  Right breast lumpectomy on 08/24/2018 revealed an intraductal papillomawith adjacent biopsy change. There was sclerotic changes suggestive of cyst/duct wall with adjacent biopsy change. There was duct ectasia and cysts in the tissue between the 2 biopsy sites. There was no atypia or malignancy.  Screening bilateral mammogramon 08/16/2019 revealed no evidence of malignancy.    Symptomatically, she feels "fine". She has not had any episodes of rectal bleeding. She has diarrhea today. She has not taken her pancreatic enzymes in 4 days because she ran out and cannot afford any more.  Exam is stable.   Plan: 1.   Labs today:  CBC with diff, CMP, ferritin. 2.Weight loss Current weight is 93 pounds (up 3 pounds). Patient's baseline weight is105-108 pounds. Patient has intermittent diarrhea.   Diarrhea improved on pancreatic enzymes.   Diarrhea hasre turned secondary to running out of pancreatic enzyme Thyroid function is normal. Patient smokes.   Low dose chest CT 03/30/2019 revealed scattered small LLL nodules. CT angiogramof the abdomen and pelvison 09/30/2019revealedno masses.             Mammogram on 11/15/2018 revealed no evidence  of malignancy. PET scan on 05/15/2020 revealed no definitive evidence of malignancy.   There was equivocal uptake in the LEFT glossotonsillar region and parapharyngeal region.   ENT evaluation revealed no abnormality.  Continue to monitor. 3.Iron deficiency anemia Hematocrit 42.1.Hemoglobin14.9.MCV91.7 on 04/18/2020. Ferritin36 with an iron saturation of 12% and a TIBC of 322. Hematocrit 40.6.  Hemoglobin 14.3.  MCV 94.0 on 08/12/2020.  Ferritin 21 (available after clinic). Patient denies any interval rectal bleeding. Encourage follow-up with GI. Continue to monitor. 4.B12 deficiency Patient receives B12 injections. Last B12was on 06/17/2020. B12 today and monthly x6. Check folate with next lab draw. 5.Pulmonary nodules Low dose chest CT on 03/30/2019 revealedscattered small(up to 3.9 mm)bilateral pulmonary nodules in the left lower lobe. Chest CT on 02/19/2020 revealed no evidence of maligancy.  She has emphysema and a few tiny pulmonary nodules (none > 3 mm). Continue low-dose chest CT  program. 6. GI issues Patient notes diarrhea improved with pancreatic enzymes. Pancreatic enzymes are cost prohibitive. Courage follow-up with GI for samples and assistance with medication. 7. Hypokalemia  Potassium 3.2.  Rx:  Potassium 20 meq po q day x 3 days. 8.   No Venofer today. 9.   B12 today and monthly x 6. 10.   RTC in 3 months for labs (CBC, ferritin, folate), and B12 11.   RTC in 6 months for MD assessment, labs (CBC with diff, CMP, ferritin), and B12.  I discussed the assessment and treatment plan with the patient.  The patient was provided an opportunity to ask questions and all were answered.  The patient agreed with the plan and demonstrated an understanding of the instructions.  The patient was advised to call back if the symptoms worsen or if the condition fails to improve as anticipated.  I provided 16 minutes of face-to-face time during this this encounter and > 50% was spent counseling as documented under my assessment and plan.  An additional 7 minutes were spent reviewing her chart (Epic and Care Everywhere) including notes, labs, and imaging studies.    Lequita Asal, MD, PhD    08/12/2020, 2:08 PM  I, De Burrs, am acting as scribe for [employer]. Mike Gip, MD, PhD.  I, Saidi Santacroce C. Mike Gip, MD, have reviewed the above documentation for accuracy and completeness, and I agree with the above.

## 2020-08-12 ENCOUNTER — Inpatient Hospital Stay: Payer: Medicare HMO | Attending: Hematology and Oncology | Admitting: Hematology and Oncology

## 2020-08-12 ENCOUNTER — Encounter: Payer: Self-pay | Admitting: Hematology and Oncology

## 2020-08-12 ENCOUNTER — Other Ambulatory Visit: Payer: Self-pay | Admitting: *Deleted

## 2020-08-12 ENCOUNTER — Other Ambulatory Visit: Payer: Self-pay

## 2020-08-12 ENCOUNTER — Inpatient Hospital Stay: Payer: Medicare HMO

## 2020-08-12 VITALS — BP 146/84 | HR 68 | Temp 98.4°F | Wt 93.4 lb

## 2020-08-12 DIAGNOSIS — E876 Hypokalemia: Secondary | ICD-10-CM | POA: Insufficient documentation

## 2020-08-12 DIAGNOSIS — Z801 Family history of malignant neoplasm of trachea, bronchus and lung: Secondary | ICD-10-CM | POA: Insufficient documentation

## 2020-08-12 DIAGNOSIS — M549 Dorsalgia, unspecified: Secondary | ICD-10-CM | POA: Diagnosis not present

## 2020-08-12 DIAGNOSIS — J449 Chronic obstructive pulmonary disease, unspecified: Secondary | ICD-10-CM | POA: Diagnosis not present

## 2020-08-12 DIAGNOSIS — D509 Iron deficiency anemia, unspecified: Secondary | ICD-10-CM | POA: Diagnosis not present

## 2020-08-12 DIAGNOSIS — F17210 Nicotine dependence, cigarettes, uncomplicated: Secondary | ICD-10-CM | POA: Insufficient documentation

## 2020-08-12 DIAGNOSIS — Z800 Family history of malignant neoplasm of digestive organs: Secondary | ICD-10-CM | POA: Diagnosis not present

## 2020-08-12 DIAGNOSIS — M542 Cervicalgia: Secondary | ICD-10-CM | POA: Diagnosis not present

## 2020-08-12 DIAGNOSIS — Z8049 Family history of malignant neoplasm of other genital organs: Secondary | ICD-10-CM | POA: Diagnosis not present

## 2020-08-12 DIAGNOSIS — K219 Gastro-esophageal reflux disease without esophagitis: Secondary | ICD-10-CM | POA: Diagnosis not present

## 2020-08-12 DIAGNOSIS — E538 Deficiency of other specified B group vitamins: Secondary | ICD-10-CM

## 2020-08-12 DIAGNOSIS — R5383 Other fatigue: Secondary | ICD-10-CM | POA: Diagnosis not present

## 2020-08-12 DIAGNOSIS — Z8249 Family history of ischemic heart disease and other diseases of the circulatory system: Secondary | ICD-10-CM | POA: Insufficient documentation

## 2020-08-12 DIAGNOSIS — Z833 Family history of diabetes mellitus: Secondary | ICD-10-CM | POA: Diagnosis not present

## 2020-08-12 DIAGNOSIS — R0602 Shortness of breath: Secondary | ICD-10-CM | POA: Diagnosis not present

## 2020-08-12 DIAGNOSIS — D500 Iron deficiency anemia secondary to blood loss (chronic): Secondary | ICD-10-CM

## 2020-08-12 DIAGNOSIS — R634 Abnormal weight loss: Secondary | ICD-10-CM | POA: Diagnosis not present

## 2020-08-12 DIAGNOSIS — Z818 Family history of other mental and behavioral disorders: Secondary | ICD-10-CM | POA: Diagnosis not present

## 2020-08-12 DIAGNOSIS — Z803 Family history of malignant neoplasm of breast: Secondary | ICD-10-CM | POA: Diagnosis not present

## 2020-08-12 DIAGNOSIS — Z836 Family history of other diseases of the respiratory system: Secondary | ICD-10-CM | POA: Insufficient documentation

## 2020-08-12 DIAGNOSIS — Z8261 Family history of arthritis: Secondary | ICD-10-CM | POA: Insufficient documentation

## 2020-08-12 DIAGNOSIS — R918 Other nonspecific abnormal finding of lung field: Secondary | ICD-10-CM

## 2020-08-12 DIAGNOSIS — Z79899 Other long term (current) drug therapy: Secondary | ICD-10-CM | POA: Diagnosis not present

## 2020-08-12 DIAGNOSIS — Z7289 Other problems related to lifestyle: Secondary | ICD-10-CM | POA: Insufficient documentation

## 2020-08-12 DIAGNOSIS — K625 Hemorrhage of anus and rectum: Secondary | ICD-10-CM | POA: Insufficient documentation

## 2020-08-12 DIAGNOSIS — R197 Diarrhea, unspecified: Secondary | ICD-10-CM | POA: Diagnosis not present

## 2020-08-12 LAB — COMPREHENSIVE METABOLIC PANEL
ALT: 12 U/L (ref 0–44)
AST: 24 U/L (ref 15–41)
Albumin: 4.2 g/dL (ref 3.5–5.0)
Alkaline Phosphatase: 65 U/L (ref 38–126)
Anion gap: 8 (ref 5–15)
BUN: 20 mg/dL (ref 6–20)
CO2: 26 mmol/L (ref 22–32)
Calcium: 9 mg/dL (ref 8.9–10.3)
Chloride: 102 mmol/L (ref 98–111)
Creatinine, Ser: 0.75 mg/dL (ref 0.44–1.00)
GFR calc Af Amer: >60 mL/min (ref >60)
GFR calc non Af Amer: >60 mL/min (ref >60)
Glucose, Bld: 105 mg/dL — ABNORMAL HIGH (ref 70–99)
Potassium: 3.2 mmol/L — ABNORMAL LOW (ref 3.5–5.1)
Sodium: 136 mmol/L (ref 135–145)
Total Bilirubin: 0.7 mg/dL (ref 0.3–1.2)
Total Protein: 7.4 g/dL (ref 6.5–8.1)

## 2020-08-12 LAB — CBC WITH DIFFERENTIAL/PLATELET
Abs Immature Granulocytes: 0.01 10*3/uL (ref 0.00–0.07)
Basophils Absolute: 0.1 10*3/uL (ref 0.0–0.1)
Basophils Relative: 1 %
Eosinophils Absolute: 0.1 10*3/uL (ref 0.0–0.5)
Eosinophils Relative: 1 %
HCT: 40.6 % (ref 36.0–46.0)
Hemoglobin: 14.3 g/dL (ref 12.0–15.0)
Immature Granulocytes: 0 %
Lymphocytes Relative: 45 %
Lymphs Abs: 3.1 10*3/uL (ref 0.7–4.0)
MCH: 33.1 pg (ref 26.0–34.0)
MCHC: 35.2 g/dL (ref 30.0–36.0)
MCV: 94 fL (ref 80.0–100.0)
Monocytes Absolute: 0.5 10*3/uL (ref 0.1–1.0)
Monocytes Relative: 8 %
Neutro Abs: 3.1 10*3/uL (ref 1.7–7.7)
Neutrophils Relative %: 45 %
Platelets: 312 10*3/uL (ref 150–400)
RBC: 4.32 MIL/uL (ref 3.87–5.11)
RDW: 12.4 % (ref 11.5–15.5)
WBC: 7 10*3/uL (ref 4.0–10.5)
nRBC: 0 % (ref 0.0–0.2)

## 2020-08-12 LAB — FERRITIN: Ferritin: 21 ng/mL (ref 11–307)

## 2020-08-12 MED ORDER — CYANOCOBALAMIN 1000 MCG/ML IJ SOLN
1000.0000 ug | Freq: Once | INTRAMUSCULAR | Status: AC
  Administered 2020-08-12: 1000 ug via INTRAMUSCULAR
  Filled 2020-08-12: qty 1

## 2020-08-12 MED ORDER — POTASSIUM CHLORIDE CRYS ER 20 MEQ PO TBCR: 20.0000 meq | EXTENDED_RELEASE_TABLET | Freq: Every day | ORAL | 0 refills | Status: DC

## 2020-08-12 NOTE — Progress Notes (Signed)
Pt can't afford the pancrealipase so in the last few days her diarrhea has came back. She has good intake and output urine.. she has been tired for 3 days and feels like when she gets up that she could take it nap

## 2020-08-23 ENCOUNTER — Other Ambulatory Visit: Payer: Self-pay

## 2020-08-23 ENCOUNTER — Ambulatory Visit (INDEPENDENT_AMBULATORY_CARE_PROVIDER_SITE_OTHER): Payer: Medicare HMO | Admitting: Internal Medicine

## 2020-08-23 ENCOUNTER — Encounter: Payer: Self-pay | Admitting: Internal Medicine

## 2020-08-23 ENCOUNTER — Other Ambulatory Visit
Admission: RE | Admit: 2020-08-23 | Discharge: 2020-08-23 | Disposition: A | Discharge status: Home / Self Care | Payer: Medicare HMO | Attending: Internal Medicine | Admitting: Internal Medicine

## 2020-08-23 VITALS — BP 118/78 | HR 73 | Temp 97.7°F | Ht 61.0 in | Wt 95.0 lb

## 2020-08-23 DIAGNOSIS — Z87891 Personal history of nicotine dependence: Secondary | ICD-10-CM

## 2020-08-23 DIAGNOSIS — Z7185 Encounter for immunization safety counseling: Secondary | ICD-10-CM

## 2020-08-23 DIAGNOSIS — J449 Chronic obstructive pulmonary disease, unspecified: Secondary | ICD-10-CM | POA: Diagnosis not present

## 2020-08-23 DIAGNOSIS — Z23 Encounter for immunization: Secondary | ICD-10-CM | POA: Diagnosis not present

## 2020-08-23 DIAGNOSIS — R918 Other nonspecific abnormal finding of lung field: Secondary | ICD-10-CM

## 2020-08-23 MED ORDER — SPIRIVA RESPIMAT 2.5 MCG/ACT IN AERS: 2.0000 | INHALATION_SPRAY | Freq: Every day | RESPIRATORY_TRACT | 0 refills | Status: DC

## 2020-08-23 NOTE — Progress Notes (Signed)
OV 08/23/2020  Subjective:  Patient ID: Molly Peters, female , DOB: August 15, 1961 , age 59 y.o. , MRN: [id number] , ADDRESS: [place]  [id number] PCP Venia Carbon, MD Patient Care Team: Venia Carbon, MD as PCP - General Virgel Manifold, MD as Consulting Physician (Gastroenterology) Vickie Epley, MD (Inactive) as Consulting Physician (General Surgery) Lequita Asal, MD as Referring Physician (Hematology and Oncology)  This Provider for this visit: Treatment Team:  Attending Provider: Brand Males, MD    08/23/2020 -   Chief Complaint  Patient presents with  . Consult    PCP diagnosed with COPD years ago. SOB on exertion, prod cough-yellow mucus, wheezing when coughing or on exertion.     HPI Molly Peters 59 y.o. -is a smoker.  She has a strong family history of COPD on her mother side.  She is aware that she has had COPD for a few to several years but her symptoms documented below have been getting worse steadily.  Her primary care physician in the spring 2021 did a CT scan of the chest that showed emphysema with 3 mm nodules and therefore she has been referred here.  She only uses albuterol as rescue and on that only recently.  Her walking desaturation test was normal.  Overall she is active.  She has not had the flu shot or Covid vaccine.  She says multiple family members in the distant family who she has not been exposed to recovering from Covid.  He is willing to have the vaccine.  She says she was scared of the vaccine but after seeing people get Covid she is now willing to have the vaccine.  She understands the need to quit smoking and is trying gum.  She wants establish care here on a long-term basis.  She also has fatigue.  She has never been to pulmonary rehabilitation.  She reports allergy to Spiriva but I think that is from the DPI.  Currently we have Respimat and she is willing to try it can order it  CAT COPD  Symptom & Quality of Life Score ([employer] trademark) 0 is no burden. 5 is highest burden 08/23/2020   Never Cough -> Cough all the time 5  No phlegm in chest -> Chest is full of phlegm 4  No chest tightness -> Chest feels very tight 2  No dyspnea for 1 flight stairs/hill -> Very dyspneic for 1 flight of stairs 5  No limitations for ADL at home -> Very limited with ADL at home 3  Confident leaving home -> Not at all confident leaving home 4  Sleep soundly -> Do not sleep soundly because of lung condition 1  Lots of Energy -> No energy at all 5  TOTAL Score (max 40)  29   Simple office walk 185 feet x  3 laps goal with forehead probe 08/23/2020   O2 used ra  Number laps completed 3  Comments about pace ml  Resting Pulse Ox/HR 100% and 82/min  Final Pulse Ox/HR 100% and 84/min  Desaturated </= 88% mno  Desaturated <= 3% points no  Got Tachycardic >/= 90/min no  Symptoms at end of test non  Miscellaneous comments x    IMPRESSION: CT chest April 2021 1. No evidence of malignancy within the chest, abdomen or pelvis to account for patient's weight loss. No acute findings.  2. Emphysematous disease. Few tiny bilateral pulmonary nodules none greater than 3  mm in size without significant change from the prior exam. Recommend annual low-dose CT lung cancer screening as suggested previously.  3.  Mild colonic diverticulosis.  4. Aortic Atherosclerosis (ICD10-I70.0) and Emphysema (ICD10-J43.9). Atherosclerotic coronary artery disease.  5. Mild uniform thickening of the gastric antrum which may be secondary to peristalsis/spasm, although gastritis/antritis is possible.   Electronically Signed   By: Molly Peters M.D.   On: 02/19/2020 14:49    ROS - per HPI     has a past medical history of Anxiety, Cervical cancer ([place]) (1980's), Chronic back pain (11/12/2009), COPD (chronic obstructive pulmonary disease) ([place]), Depression, Difficulty swallowing pills, Family history of  adverse reaction to anesthesia, Fibromyalgia, GERD (gastroesophageal reflux disease), Heart murmur, History of gastric ulcer, Hypertension, IBS (irritable bowel syndrome), Internal hemorrhoid, Intraductal papilloma of breast, right (08/08/2018), Limited joint range of motion, Localized primary osteoarthritis of right shoulder region (11/23/2014), Osteoarthritis of left shoulder (07/05/2015), Osteoarthritis of right shoulder (11/2014), Pneumonia, Seizures ([place]), Short-term memory loss, TMJ syndrome, Valvular heart disease, and Wears dentures.   reports that she has been smoking cigarettes. She has a 42.75 pack-year smoking history. She has never used smokeless tobacco.  Past Surgical History:  Procedure Laterality Date  . ABDOMINAL HYSTERECTOMY     partial  . ANTERIOR CERVICAL DECOMP/DISCECTOMY FUSION  02/06/2011   C4-5, C5-6, C6-7  . BREAST BIOPSY Bilateral 10+ yrs ago   neg  . BREAST BIOPSY Right 08/03/2018   2 areas bx, -neg  . BREAST LUMPECTOMY Bilateral 1989   x 3 - benign  . BREAST LUMPECTOMY Right 08/24/2018   Procedure: BREAST LUMPECTOMY WITH ULTRASOUND IN OR;  Surgeon: Vickie Epley, MD;  Location: [facility];  Service: General;  Laterality: Right;  . CARDIAC CATHETERIZATION  1995  . CESAREAN SECTION     x 2  . COLONOSCOPY  09/28/2008  . COLONOSCOPY N/A 06/15/2018   Procedure: COLONOSCOPY;  Surgeon: Virgel Manifold, MD;  Location: [facility] ENDOSCOPY;  Service: Endoscopy;  Laterality: N/A;  . COLONOSCOPY WITH PROPOFOL N/A 05/04/2019   Procedure: COLONOSCOPY WITH PROPOFOL;  Surgeon: Virgel Manifold, MD;  Location: [facility] ENDOSCOPY;  Service: Endoscopy;  Laterality: N/A;  . ESOPHAGOGASTRODUODENOSCOPY  09/28/2008  . ESOPHAGOGASTRODUODENOSCOPY N/A 06/14/2018   Procedure: ESOPHAGOGASTRODUODENOSCOPY (EGD);  Surgeon: Virgel Manifold, MD;  Location: [facility] ENDOSCOPY;  Service: Endoscopy;  Laterality: N/A;  . ESOPHAGOGASTRODUODENOSCOPY (EGD) WITH PROPOFOL N/A 05/04/2019   Procedure:  ESOPHAGOGASTRODUODENOSCOPY (EGD) WITH PROPOFOL;  Surgeon: Virgel Manifold, MD;  Location: [facility] ENDOSCOPY;  Service: Endoscopy;  Laterality: N/A;  . ESOPHAGOGASTRODUODENOSCOPY (EGD) WITH PROPOFOL N/A 03/18/2020   Procedure: ESOPHAGOGASTRODUODENOSCOPY (EGD) WITH PROPOFOL;  Surgeon: Lin Landsman, MD;  Location: [facility] ENDOSCOPY;  Service: Gastroenterology;  Laterality: N/A;  . HARDWARE REMOVAL  11/17/2006   L5-S1  . LAMINECTOMY WITH POSTERIOR LATERAL ARTHRODESIS LEVEL 3  12/06/2009   with synovial cyst resection L3-4 bilat.  Marland Kitchen LAMINECTOMY WITH POSTERIOR LATERAL ARTHRODESIS LEVEL 3  11/17/2006   L4-5  . NM MYOVIEW LTD     Lexiscan myoview (10/2009): EF 77%, normal wall motion, normal perfusion.   Marland Kitchen SHOULDER ARTHROSCOPY WITH DEBRIDEMENT AND BICEP TENDON REPAIR Right 04/13/2014   Procedure: RIGHT SHOULDER ARTHROSCOPY WITH DEBRIDEMENT EXTENSIVE;  Surgeon: Johnny Bridge, MD;  Location: [place];  Service: Orthopedics;  Laterality: Right;  . TOTAL SHOULDER ARTHROPLASTY Right 11/23/2014   Procedure: TOTAL RIGHT SHOULDER ARTHROPLASTY;  Surgeon: Johnny Bridge, MD;  Location: [place];  Service: Orthopedics;  Laterality: Right;  .  TOTAL SHOULDER ARTHROPLASTY Left 07/05/2015   Procedure: LEFT TOTAL SHOULDER REPLACEMENT;  Surgeon: Marchia Bond, MD;  Location: [place];  Service: Orthopedics;  Laterality: Left;    Allergies  Allergen Reactions  . Carbamazepine Hives  . Divalproex Sodium Swelling and Other (See Comments)    HAIR FALLS OUT  . Clonazepam Other (See Comments)    HALLUCINATIONS  . Diphenhydramine Hcl Rash  . Tiotropium Bromide Monohydrate Other (See Comments)    CREATES YEAST IN THROAT  . Vicodin [Hydrocodone-Acetaminophen] Itching    Immunization History  Administered Date(s) Administered  . Influenza Split 09/28/2011  . Influenza Whole 10/03/2007, 09/20/2009, 10/21/2010  . Influenza,inj,Quad PF,6+ Mos 08/11/2016, 10/27/2017  .  Pneumococcal Conjugate-13 10/27/2017  . Pneumococcal Polysaccharide-23 10/09/2009  . Td 01/09/2003    Family History  Problem Relation Age of Onset  . Cervical cancer Mother   . Anesthesia problems Mother        post-op N/V  . Rheum arthritis Mother   . Anxiety disorder Mother   . Cancer Father        colon cancer  . Migraines Sister   . Cervical cancer Sister   . Anesthesia problems Sister        post-op N/V  . Migraines Brother   . Asthma Daughter   . Cerebral palsy Daughter        age 70  . Coronary artery disease Maternal Grandmother   . Hypertension Maternal Grandmother   . Diabetes Maternal Grandmother   . Coronary artery disease Paternal Grandmother   . Hypertension Paternal Grandmother   . Diabetes Paternal Grandmother   . Breast cancer Maternal Aunt   . Breast cancer Maternal Aunt   . Breast cancer Maternal Aunt   . Colon cancer Paternal Uncle   . Lung cancer Paternal Uncle      Current Outpatient Medications:  .  albuterol (VENTOLIN HFA) 108 (90 Base) MCG/ACT inhaler, Inhale 2 puffs into the lungs every 6 (six) hours as needed for wheezing or shortness of breath., Disp: 6 g, Rfl: 1 .  ALPRAZolam (XANAX) 1 MG tablet, TAKE 1/2 TO 1 TABLET BY MOUTH 3 TIMES DAILY AS NEEDED ANXIETY, Disp: 90 tablet, Rfl: 0 .  fluticasone (FLONASE) 50 MCG/ACT nasal spray, Place 2 sprays into both nostrils daily., Disp: , Rfl:  .  lamoTRIgine (LAMICTAL) 100 MG tablet, Take 1 tablet (100 mg total) by mouth 2 (two) times daily., Disp: 180 tablet, Rfl: 3 .  latanoprost (XALATAN) 0.005 % ophthalmic solution, , Disp: , Rfl:  .  losartan-hydrochlorothiazide (HYZAAR) 100-12.5 MG tablet, Take 1 tablet by mouth daily., Disp: 90 tablet, Rfl: 3 .  methocarbamol (ROBAXIN) 500 MG tablet, Take 500 mg by mouth every 6 (six) hours as needed. , Disp: , Rfl:  .  omeprazole (PRILOSEC) 40 MG capsule, TAKE 1 CAPSULE BY MOUTH TWICE DAILY BEFORE MEALS, Disp: 60 capsule, Rfl: 1 .  ondansetron (ZOFRAN ODT) 4  MG disintegrating tablet, Take 1 tablet (4 mg total) by mouth every 8 (eight) hours as needed for nausea or vomiting., Disp: 20 tablet, Rfl: 0 .  oxyCODONE (OXY IR/ROXICODONE) 5 MG immediate release tablet, Take 5 mg by mouth every 6 (six) hours as needed. , Disp: , Rfl:  .  PARoxetine (PAXIL) 20 MG tablet, TAKE 1 TABLET BY MOUTH ONCE DAILY, Disp: 90 tablet, Rfl: 3 .  Pancrelipase, Lip-Prot-Amyl, (ZENPEP) 40000-126000 units CPEP, TAKE 2 CAPSULES BY MOUTH EACH MEAL AND 1CAPSULE WITH [facility] (Patient not taking: Reported on  08/23/2020), Disp: 240 capsule, Rfl: 1 .  potassium chloride SA (KLOR-CON) 20 MEQ tablet, Take 1 tablet (20 mEq total) by mouth daily. (Patient not taking: Reported on 08/23/2020), Disp: 3 tablet, Rfl: 0      Objective:   Vitals:   08/23/20 0914  BP: 118/78  Pulse: 73  Temp: 97.7 F (36.5 C)  TempSrc: Temporal  SpO2: 96%  Weight: 95 lb (43.1 kg)  Height: 5\' 1"  (1.549 m)    Estimated body mass index is 17.95 kg/m as calculated from the following:   Height as of this encounter: 5\' 1"  (1.549 m).   Weight as of this encounter: 95 lb (43.1 kg).  @WEIGHTCHANGE @  [employer]   08/23/20 0914  Weight: 95 lb (43.1 kg)     Physical Exam  General Appearance:    Alert, cooperative, no distress, appears stated age - yes , Deconditioned looking - mild yes , OBESE  - no, Sitting on Wheelchair -  no  Head:    Normocephalic, without obvious abnormality, atraumatic  Eyes:    PERRL, conjunctiva/corneas clear,  Ears:    Normal TM's and external ear canals, both ears  Nose:   Nares normal, septum midline, mucosa normal, no drainage    or sinus tenderness. OXYGEN ON  - no . Patient is @ ra   Throat:   Lips, mucosa, and tongue normal; teeth and gums normal. Cyanosis on lips - no  Neck:   Supple, symmetrical, trachea midline, no adenopathy;    thyroid:  no enlargement/tenderness/nodules; no carotid   bruit or JVD  Back:     Symmetric, no curvature, ROM normal, no CVA  tenderness  Lungs:     Distress - no , Wheeze no, Barrell Chest - no, Purse lip breathing - no, Crackles - no   Chest Wall:    No tenderness or deformity.    Heart:    Regular rate and rhythm, S1 and S2 normal, no rub   or gallop, Murmur - no  Breast Exam:    NOT DONE  Abdomen:     Soft, non-tender, bowel sounds active all four quadrants,    no masses, no organomegaly. Visceral obesity - no  Genitalia:   NOT DONE  Rectal:   NOT DONE  Extremities:   Extremities - normal, Has Cane - no, Clubbing - no, Edema - no  Pulses:   2+ and symmetric all extremities  Skin:   Stigmata of Connective Tissue Disease - no  Lymph nodes:   Cervical, supraclavicular, and axillary nodes normal  Psychiatric:  Neurologic:   Pleasant - yes, Anxious - no, Flat affect - no  CAm-ICU - neg, Alert and Oriented x 3 - yes, Moves all 4s - yes, Speech - normal, Cognition - intact  \      Assessment:       ICD-10-CM   1. COPD (chronic obstructive pulmonary disease) with chronic bronchitis (HCC)  J44.9   2. History of smoking 25-50 pack years  Z87.891   3. Multiple pulmonary nodules determined by computed tomography of lung  R91.8   4. Vaccine counseling  Z71.85        Plan:     Patient Instructions  COPD (chronic obstructive pulmonary disease) with chronic bronchitis ([place])  -You have a lot of symptoms and at least some of this are a lot of this is coming from COPD not otherwise specified.  At this point severe it is not known  Plan -Start Spiriva Respimat 2  puffs daily -Use the albuterol only as needed -Check blood work for alpha-1 antitrypsin phenotype and level -Do pulmonary function testing in the next 6-8 weeks -At some point we can discuss about pulmonary rehabilitation  History of smoking 25-50 pack years  -In the long-term he had to quit smoking to help protect your lungs  Plan -For now use the gum that you are using but in the future we can discuss about quitting smoking  Multiple pulmonary  nodules determined by computed tomography of lung -3 mm in April 2021  Plan -Repeat CT scan of the chest without contrast in May 2022  Vaccine counseling   -Appreciate you being willing to take the Covid vaccine.  Benefit outweighs the risk  Plan -Covid vaccine today if possible at the location the CMA will direct you to -Flu shot 1 week later  Follow-up -6-8 weeks but after completing pulmonary function testing       SIGNATURE    Dr. Brand Males, M.D., F.C.C.P,  Pulmonary and Critical Care Medicine Staff Physician, [place] Director - Interstitial Lung Disease  Program  Pulmonary [place] at [place], [place], [address]  Pager: [phone], If no answer or between  15:00h - 7:00h: call [phone] Telephone: [phone]  10:02 AM 08/23/2020

## 2020-08-23 NOTE — Patient Instructions (Addendum)
COPD (chronic obstructive pulmonary disease) with chronic bronchitis ([place])  -You have a lot of symptoms and at least some of this are a lot of this is coming from COPD not otherwise specified.  At this point severe it is not known  Plan -Start Spiriva Respimat 2 puffs daily -Use the albuterol only as needed -Check blood work for alpha-1 antitrypsin phenotype and level -Do pulmonary function testing in the next 6-8 weeks -At some point we can discuss about pulmonary rehabilitation  History of smoking 25-50 pack years  -In the long-term he had to quit smoking to help protect your lungs  Plan -For now use the gum that you are using but in the future we can discuss about quitting smoking  Multiple pulmonary nodules determined by computed tomography of lung -3 mm in April 2021  Plan -Repeat CT scan of the chest without contrast in May 2022  Vaccine counseling   -Appreciate you being willing to take the Covid vaccine.  Benefit outweighs the risk  Plan -Covid vaccine today if possible at the location the CMA will direct you to -Flu shot 1 week later  Follow-up -6-8 weeks but after completing pulmonary function testing

## 2020-08-26 LAB — ALPHA-1 ANTITRYPSIN PHENOTYPE: A-1 Antitrypsin, Ser: 141 mg/dL (ref 101–187)

## 2020-08-27 NOTE — Progress Notes (Signed)
Alpha 1 is MM and normal. Will not call with results

## 2020-09-06 ENCOUNTER — Inpatient Hospital Stay: Payer: Medicare HMO

## 2020-09-06 ENCOUNTER — Other Ambulatory Visit: Payer: Self-pay

## 2020-09-06 DIAGNOSIS — D500 Iron deficiency anemia secondary to blood loss (chronic): Secondary | ICD-10-CM

## 2020-09-06 DIAGNOSIS — D509 Iron deficiency anemia, unspecified: Secondary | ICD-10-CM | POA: Diagnosis not present

## 2020-09-06 DIAGNOSIS — E538 Deficiency of other specified B group vitamins: Secondary | ICD-10-CM | POA: Diagnosis not present

## 2020-09-06 DIAGNOSIS — R5383 Other fatigue: Secondary | ICD-10-CM | POA: Diagnosis not present

## 2020-09-06 DIAGNOSIS — R0602 Shortness of breath: Secondary | ICD-10-CM | POA: Diagnosis not present

## 2020-09-06 DIAGNOSIS — J449 Chronic obstructive pulmonary disease, unspecified: Secondary | ICD-10-CM | POA: Diagnosis not present

## 2020-09-06 DIAGNOSIS — R197 Diarrhea, unspecified: Secondary | ICD-10-CM | POA: Diagnosis not present

## 2020-09-06 DIAGNOSIS — M542 Cervicalgia: Secondary | ICD-10-CM | POA: Diagnosis not present

## 2020-09-06 DIAGNOSIS — K625 Hemorrhage of anus and rectum: Secondary | ICD-10-CM | POA: Diagnosis not present

## 2020-09-06 DIAGNOSIS — E876 Hypokalemia: Secondary | ICD-10-CM | POA: Diagnosis not present

## 2020-09-06 MED ORDER — CYANOCOBALAMIN 1000 MCG/ML IJ SOLN
1000.0000 ug | Freq: Once | INTRAMUSCULAR | Status: AC
  Administered 2020-09-06: 1000 ug via INTRAMUSCULAR
  Filled 2020-09-06: qty 1

## 2020-09-09 ENCOUNTER — Inpatient Hospital Stay: Payer: Medicare HMO

## 2020-09-10 ENCOUNTER — Other Ambulatory Visit: Payer: Self-pay | Admitting: Internal Medicine

## 2020-09-11 NOTE — Telephone Encounter (Signed)
Last filled 07-31-20 #90 Last OV 06-25-20  Next OV 06-30-21 [facility]

## 2020-10-02 ENCOUNTER — Telehealth: Payer: Self-pay

## 2020-10-02 NOTE — Telephone Encounter (Signed)
Patient is aware of date/time of covid test prior to PFT.  

## 2020-10-07 ENCOUNTER — Other Ambulatory Visit: Payer: Self-pay

## 2020-10-07 ENCOUNTER — Inpatient Hospital Stay: Payer: Medicare HMO | Attending: Hematology and Oncology

## 2020-10-07 ENCOUNTER — Other Ambulatory Visit
Admission: RE | Admit: 2020-10-07 | Discharge: 2020-10-07 | Disposition: A | Discharge status: Home / Self Care | Payer: Medicare HMO | Source: Ambulatory Visit | Attending: Internal Medicine | Admitting: Internal Medicine

## 2020-10-07 DIAGNOSIS — Z20822 Contact with and (suspected) exposure to covid-19: Secondary | ICD-10-CM | POA: Insufficient documentation

## 2020-10-07 DIAGNOSIS — Z01812 Encounter for preprocedural laboratory examination: Secondary | ICD-10-CM | POA: Insufficient documentation

## 2020-10-07 DIAGNOSIS — K625 Hemorrhage of anus and rectum: Secondary | ICD-10-CM | POA: Diagnosis not present

## 2020-10-07 DIAGNOSIS — E538 Deficiency of other specified B group vitamins: Secondary | ICD-10-CM | POA: Insufficient documentation

## 2020-10-07 DIAGNOSIS — D500 Iron deficiency anemia secondary to blood loss (chronic): Secondary | ICD-10-CM

## 2020-10-07 LAB — SARS CORONAVIRUS 2 (TAT 6-24 HRS): SARS Coronavirus 2: NEGATIVE

## 2020-10-07 MED ORDER — CYANOCOBALAMIN 1000 MCG/ML IJ SOLN
1000.0000 ug | Freq: Once | INTRAMUSCULAR | Status: AC
  Administered 2020-10-07: 1000 ug via INTRAMUSCULAR
  Filled 2020-10-07: qty 1

## 2020-10-08 ENCOUNTER — Ambulatory Visit: Payer: Medicare HMO | Attending: Internal Medicine

## 2020-10-08 DIAGNOSIS — J449 Chronic obstructive pulmonary disease, unspecified: Secondary | ICD-10-CM | POA: Insufficient documentation

## 2020-10-08 DIAGNOSIS — F17210 Nicotine dependence, cigarettes, uncomplicated: Secondary | ICD-10-CM | POA: Diagnosis not present

## 2020-10-09 ENCOUNTER — Other Ambulatory Visit: Payer: Self-pay | Admitting: Internal Medicine

## 2020-10-15 LAB — PULMONARY FUNCTION TEST ARMC ONLY
DL/VA % pred: 68 %
DL/VA: 2.96 ml/min/mmHg/L
DLCO unc % pred: 58 %
DLCO unc: 10.65 ml/min/mmHg
FEF 25-75 Post: 1.14 L/sec
FEF 25-75 Pre: 1.37 L/sec
FEF2575-%Change-Post: -17 %
FEF2575-%Pred-Post: 52 %
FEF2575-%Pred-Pre: 62 %
FEV1-%Change-Post: 0 %
FEV1-%Pred-Post: 73 %
FEV1-%Pred-Pre: 74 %
FEV1-Post: 1.68 L
FEV1-Pre: 1.69 L
FEV1FVC-%Change-Post: -2 %
FEV1FVC-%Pred-Pre: 90 %
FEV6-%Change-Post: 2 %
FEV6-%Pred-Post: 84 %
FEV6-%Pred-Pre: 82 %
FEV6-Post: 2.41 L
FEV6-Pre: 2.34 L
FEV6FVC-%Change-Post: -1 %
FEV6FVC-%Pred-Post: 102 %
FEV6FVC-%Pred-Pre: 103 %
FVC-%Change-Post: 1 %
FVC-%Pred-Post: 82 %
FVC-%Pred-Pre: 81 %
FVC-Post: 2.43 L
FVC-Pre: 2.39 L
Post FEV1/FVC ratio: 69 %
Post FEV6/FVC ratio: 99 %
Pre FEV1/FVC ratio: 71 %
Pre FEV6/FVC Ratio: 100 %
RV % pred: 106 %
RV: 1.96 L
TLC % pred: 94 %
TLC: 4.34 L

## 2020-10-24 ENCOUNTER — Other Ambulatory Visit: Payer: Self-pay | Admitting: Internal Medicine

## 2020-10-24 NOTE — Telephone Encounter (Signed)
Last filled 09-12-20 #90 Last OV 8-17-1 Next OV 06-30-21 [facility]

## 2020-11-04 ENCOUNTER — Inpatient Hospital Stay: Payer: Medicare HMO | Attending: Hematology and Oncology

## 2020-11-04 ENCOUNTER — Other Ambulatory Visit: Payer: Self-pay

## 2020-11-04 ENCOUNTER — Inpatient Hospital Stay: Payer: Medicare HMO

## 2020-11-04 DIAGNOSIS — K922 Gastrointestinal hemorrhage, unspecified: Secondary | ICD-10-CM | POA: Insufficient documentation

## 2020-11-04 DIAGNOSIS — D500 Iron deficiency anemia secondary to blood loss (chronic): Secondary | ICD-10-CM | POA: Diagnosis not present

## 2020-11-04 DIAGNOSIS — E876 Hypokalemia: Secondary | ICD-10-CM | POA: Diagnosis not present

## 2020-11-04 DIAGNOSIS — Z79899 Other long term (current) drug therapy: Secondary | ICD-10-CM | POA: Diagnosis not present

## 2020-11-04 DIAGNOSIS — E538 Deficiency of other specified B group vitamins: Secondary | ICD-10-CM

## 2020-11-04 DIAGNOSIS — R911 Solitary pulmonary nodule: Secondary | ICD-10-CM | POA: Diagnosis not present

## 2020-11-04 LAB — CBC
HCT: 44.5 % (ref 36.0–46.0)
Hemoglobin: 15.7 g/dL — ABNORMAL HIGH (ref 12.0–15.0)
MCH: 32.8 pg (ref 26.0–34.0)
MCHC: 35.3 g/dL (ref 30.0–36.0)
MCV: 92.9 fL (ref 80.0–100.0)
Platelets: 337 10*3/uL (ref 150–400)
RBC: 4.79 MIL/uL (ref 3.87–5.11)
RDW: 13.1 % (ref 11.5–15.5)
WBC: 8 10*3/uL (ref 4.0–10.5)
nRBC: 0 % (ref 0.0–0.2)

## 2020-11-04 LAB — FOLATE: Folate: 10.1 ng/mL (ref >5.9)

## 2020-11-04 LAB — FERRITIN: Ferritin: 15 ng/mL (ref 11–307)

## 2020-11-04 MED ORDER — CYANOCOBALAMIN 1000 MCG/ML IJ SOLN
1000.0000 ug | Freq: Once | INTRAMUSCULAR | Status: AC
  Administered 2020-11-04: 1000 ug via INTRAMUSCULAR

## 2020-11-04 MED ORDER — CYANOCOBALAMIN 1000 MCG/ML IJ SOLN
INTRAMUSCULAR | Status: AC
  Filled 2020-11-04: qty 1

## 2020-11-26 ENCOUNTER — Telehealth: Payer: Medicare HMO | Admitting: Family Medicine

## 2020-11-26 ENCOUNTER — Ambulatory Visit
Admission: EM | Admit: 2020-11-26 | Discharge: 2020-11-26 | Disposition: A | Discharge status: Home / Self Care | Payer: Medicare HMO | Attending: Physician Assistant | Admitting: Physician Assistant

## 2020-11-26 ENCOUNTER — Other Ambulatory Visit: Payer: Self-pay

## 2020-11-26 ENCOUNTER — Encounter: Payer: Self-pay | Admitting: Emergency Medicine

## 2020-11-26 ENCOUNTER — Telehealth: Payer: Self-pay | Admitting: *Deleted

## 2020-11-26 ENCOUNTER — Ambulatory Visit (INDEPENDENT_AMBULATORY_CARE_PROVIDER_SITE_OTHER): Payer: Medicare HMO

## 2020-11-26 ENCOUNTER — Encounter: Payer: Self-pay | Admitting: Family Medicine

## 2020-11-26 VITALS — Ht 61.0 in

## 2020-11-26 DIAGNOSIS — R059 Cough, unspecified: Secondary | ICD-10-CM

## 2020-11-26 DIAGNOSIS — U071 COVID-19: Secondary | ICD-10-CM | POA: Diagnosis not present

## 2020-11-26 DIAGNOSIS — J441 Chronic obstructive pulmonary disease with (acute) exacerbation: Secondary | ICD-10-CM | POA: Diagnosis not present

## 2020-11-26 DIAGNOSIS — R0602 Shortness of breath: Secondary | ICD-10-CM

## 2020-11-26 MED ORDER — GUAIFENESIN-CODEINE 100-10 MG/5ML PO SYRP: 10.0000 mL | ORAL_SOLUTION | Freq: Four times a day (QID) | ORAL | 0 refills | Status: AC | PRN

## 2020-11-26 MED ORDER — PREDNISONE 20 MG PO TABS: 20.0000 mg | ORAL_TABLET | Freq: Every day | ORAL | 0 refills | Status: AC

## 2020-11-26 MED ORDER — DOXYCYCLINE HYCLATE 100 MG PO CAPS: 100.0000 mg | ORAL_CAPSULE | Freq: Two times a day (BID) | ORAL | 0 refills | Status: AC

## 2020-11-26 NOTE — ED Triage Notes (Signed)
Pt c/o cough, body aches, fatigue. Started about 2 weeks ago. She states her husband tested positive but has gotten better, she has not.

## 2020-11-26 NOTE — Telephone Encounter (Signed)
Please check on her tomorrow 

## 2020-11-26 NOTE — Telephone Encounter (Signed)
Patient called stating that she was diagnosed with covid [facility]. Patient stated that she has a productive cough, chest pain that goes thru to her back and is concerned about pneumonia because she has had that in the past. . Patient stated that she has body aches, SOB and difficulty breathing. Patient stated that she did have a fever but denies one at this time. Patient stated that her inhaler helps some but not much. Patient was advised with her symptoms she probably should have a face to face evaluation so that someone can listen her her lungs. Patient was given information on the [facility] since they have x-ray capability should she need one. Patient stated that she would feel better going to an UC to be seen in person.  Patient stated that her husband had to run an errand and she will plan on going to the UC when he returns.

## 2020-11-26 NOTE — Discharge Instructions (Signed)
Your chest x-ray is normal today.  It looks like you are having an exacerbation of your COPD.  I have sent an antibiotic, prednisone and a cough medication for you to take.  Follow-up with [place] or your PCP if you are not feeling better over the next week.  Go to emergency department if you have severe acute worsening of your breathing or any chest pain or weakness.  Increase your rest and fluid intake.  Continue your at home inhalers.

## 2020-11-26 NOTE — ED Provider Notes (Signed)
[facility]    CSN: [id number] Arrival date & time: 11/26/20  1435      History   Chief Complaint Chief Complaint  Patient presents with  . Cough  . covid     HPI Molly Peters is a 60 y.o. female presenting for 2 week history of cough, fatigue, and body aches. She says that her husband has COVID but has gotten better while she has not. Patient says that she thinks she has tested positive for COVID-19 through the health clinic. Patient says she has continued to have a productive cough. She says that the cough is productive of yellowish sputum. Admits to increased shortness of breath. Patient does have history of COPD. She states she takes Spiriva inhaler and uses albuterol couple times a day as needed for shortness of breath. She has been taking multiple over-the-counter cough medication without improvement in the cough. She denies any fevers, but states she has felt feverish. She has also had a mild sore throat and nasal congestion. Patient states she has an at home pulse ox and has been getting some readings at about 94 to 95%. She says that she spoke with her PCP and discussed this with them. She states that they advised her to go to urgent care to get checked out and obtain a chest x-ray. Other past medical history significant for hypertension, seizures, fibromyalgia, and osteoarthritis. Patient has no other complaints or concerns at this time.  HPI  Past Medical History:  Diagnosis Date  . Anxiety   . Cervical cancer ([place]) 1980's  . Chronic back pain 11/12/2009   Qualifier: Diagnosis of  By: Maxie Better FNP, Rosalita Levan   . COPD (chronic obstructive pulmonary disease) (HCC)    states SOB with ADLs; no O2 use; able to speak in complete sentences without SOB(11/15/2014)  . Depression   . Difficulty swallowing pills    s/p cervical fusion  . Family history of adverse reaction to anesthesia    pt's mother and sister have hx. of post-op N/V  . Fibromyalgia   . GERD  (gastroesophageal reflux disease)   . Heart murmur   . History of gastric ulcer   . Hypertension    states under control with med., has been on med. x 1-2 yr.  . IBS (irritable bowel syndrome)   . Internal hemorrhoid    states has had intermittent bright red bleeding with BM (11/15/2014)  . Intraductal papilloma of breast, right 08/08/2018  . Limited joint range of motion    neck - s/p cervical fusion  . Localized primary osteoarthritis of right shoulder region 11/23/2014  . Osteoarthritis of left shoulder 07/05/2015  . Osteoarthritis of right shoulder 11/2014  . Pneumonia   . Seizures ([place])    last seizure 2010  . Short-term memory loss   . TMJ syndrome   . Valvular heart disease    Initial workup a number of years ago at [facility].  Echo (10/2009) showed EF 60-65%,      normal LV size, moderate aortic insufficiency with a trileaflet aortic valve and normal aortic root size.  Mild      mitral regurgitation.   . Wears dentures    lower    Patient Active Problem List   Diagnosis Date Noted  . Malnutrition of moderate degree ([place]) 06/25/2020  . Hypokalemia 05/02/2020  . History of gastric ulcer   . Chronic nausea 02/20/2020  . Goals of care, counseling/discussion 02/20/2020  . Fatigue 01/30/2020  . Personal history  of colonic polyps   . Diverticulosis of large intestine without diverticulitis   . Pulmonary nodules 08/08/2018  . B12 deficiency 07/21/2018  . Weight loss 07/21/2018  . Iron deficiency anemia due to chronic blood loss 07/05/2018  . Polyp of colon   . Acute peptic ulcer of stomach   . Acute posthemorrhagic anemia   . Advance directive discussed with patient 10/27/2017  . Neuropathy 08/11/2016  . Vitamin D deficiency 03/15/2016  . 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 Hx of cervical spine surgery (Left ACDF) 02/02/2016  . Chronic pain 01/29/2016  . Marijuana use 01/29/2016  . Osteoarthritis of shoulder (Left) 07/05/2015  . S/P  Bilateral Total Shoulder Replacement (2016) 11/23/2014  . Osteoarthritis of shoulder (Right) 04/13/2014  . Routine general medical examination at a health care facility 04/05/2013  . CAROTID BRUIT, LEFT 01/02/2011  . Restless legs syndrome (RLS) 10/21/2010  . Essential hypertension, benign 06/23/2010  . HYPERLIPIDEMIA-MIXED 10/23/2009  . Valvular heart disease 03/27/2009  . IRRITABLE BOWEL SYNDROME 11/13/2008  . Mood disorder ([place]) 09/07/2007  . Tobacco use disorder 09/07/2007  . GLAUCOMA 09/07/2007  . HEMORRHOIDS 09/07/2007  . Esophagogastric ulcer 09/07/2007  . COPD (chronic obstructive pulmonary disease) with chronic bronchitis ([place]) 08/08/2007  . GERD 08/08/2007  . Seizure disorder ([place]) 08/08/2007    Past Surgical History:  Procedure Laterality Date  . ABDOMINAL HYSTERECTOMY     partial  . ANTERIOR CERVICAL DECOMP/DISCECTOMY FUSION  02/06/2011   C4-5, C5-6, C6-7  . BREAST BIOPSY Bilateral 10+ yrs ago   neg  . BREAST BIOPSY Right 08/03/2018   2 areas bx, -neg  . BREAST LUMPECTOMY Bilateral 1989   x 3 - benign  . BREAST LUMPECTOMY Right 08/24/2018   Procedure: BREAST LUMPECTOMY WITH ULTRASOUND IN OR;  Surgeon: Vickie Epley, MD;  Location: [facility] ORS;  Service: General;  Laterality: Right;  . CARDIAC CATHETERIZATION  1995  . CESAREAN SECTION     x 2  . COLONOSCOPY  09/28/2008  . COLONOSCOPY N/A 06/15/2018   Procedure: COLONOSCOPY;  Surgeon: Virgel Manifold, MD;  Location: [facility] ENDOSCOPY;  Service: Endoscopy;  Laterality: N/A;  . COLONOSCOPY WITH PROPOFOL N/A 05/04/2019   Procedure: COLONOSCOPY WITH PROPOFOL;  Surgeon: Virgel Manifold, MD;  Location: [facility] ENDOSCOPY;  Service: Endoscopy;  Laterality: N/A;  . ESOPHAGOGASTRODUODENOSCOPY  09/28/2008  . ESOPHAGOGASTRODUODENOSCOPY N/A 06/14/2018   Procedure: ESOPHAGOGASTRODUODENOSCOPY (EGD);  Surgeon: Virgel Manifold, MD;  Location: [facility] ENDOSCOPY;  Service: Endoscopy;  Laterality: N/A;  . ESOPHAGOGASTRODUODENOSCOPY  (EGD) WITH PROPOFOL N/A 05/04/2019   Procedure: ESOPHAGOGASTRODUODENOSCOPY (EGD) WITH PROPOFOL;  Surgeon: Virgel Manifold, MD;  Location: [facility] ENDOSCOPY;  Service: Endoscopy;  Laterality: N/A;  . ESOPHAGOGASTRODUODENOSCOPY (EGD) WITH PROPOFOL N/A 03/18/2020   Procedure: ESOPHAGOGASTRODUODENOSCOPY (EGD) WITH PROPOFOL;  Surgeon: Lin Landsman, MD;  Location: [facility] ENDOSCOPY;  Service: Gastroenterology;  Laterality: N/A;  . HARDWARE REMOVAL  11/17/2006   L5-S1  . LAMINECTOMY WITH POSTERIOR LATERAL ARTHRODESIS LEVEL 3  12/06/2009   with synovial cyst resection L3-4 bilat.  Marland Kitchen LAMINECTOMY WITH POSTERIOR LATERAL ARTHRODESIS LEVEL 3  11/17/2006   L4-5  . NM MYOVIEW LTD     Lexiscan myoview (10/2009): EF 77%, normal wall motion, normal perfusion.   Marland Kitchen SHOULDER ARTHROSCOPY WITH DEBRIDEMENT AND BICEP TENDON REPAIR Right 04/13/2014   Procedure: RIGHT SHOULDER ARTHROSCOPY WITH DEBRIDEMENT EXTENSIVE;  Surgeon: Johnny Bridge, MD;  Location: [place];  Service: Orthopedics;  Laterality: Right;  . TOTAL  SHOULDER ARTHROPLASTY Right 11/23/2014   Procedure: TOTAL RIGHT SHOULDER ARTHROPLASTY;  Surgeon: Johnny Bridge, MD;  Location: [place];  Service: Orthopedics;  Laterality: Right;  . TOTAL SHOULDER ARTHROPLASTY Left 07/05/2015   Procedure: LEFT TOTAL SHOULDER REPLACEMENT;  Surgeon: Marchia Bond, MD;  Location: [place];  Service: Orthopedics;  Laterality: Left;    OB History   No obstetric history on file.      Home Medications    Prior to Admission medications   Medication Sig Start Date End Date Taking? Authorizing Provider  albuterol (VENTOLIN HFA) 108 (90 Base) MCG/ACT inhaler Inhale 2 puffs into the lungs every 6 (six) hours as needed for wheezing or shortness of breath. 06/25/20  Yes Venia Carbon, MD  ALPRAZolam Duanne Moron) 1 MG tablet TAKE 1/2 TO 1 TABLET BY MOUTH 3 TIMES DAILY AS NEEDED ANXIETY 10/24/20  Yes Venia Carbon, MD   doxycycline (VIBRAMYCIN) 100 MG capsule Take 1 capsule (100 mg total) by mouth 2 (two) times daily for 7 days. 11/26/20 12/03/20 Yes Danton Clap, PA-C  guaiFENesin-codeine (ROBITUSSIN AC) 100-10 MG/5ML syrup Take 10 mLs by mouth 4 (four) times daily as needed for up to 7 days for cough. 11/26/20 12/03/20 Yes Danton Clap, PA-C  lamoTRIgine (LAMICTAL) 100 MG tablet Take 1 tablet (100 mg total) by mouth 2 (two) times daily. 06/25/20  Yes Venia Carbon, MD  losartan-hydrochlorothiazide (HYZAAR) 100-12.5 MG tablet Take 1 tablet by mouth daily. 10/12/19  Yes Venia Carbon, MD  omeprazole (PRILOSEC) 40 MG capsule TAKE 1 CAPSULE BY MOUTH TWICE DAILY BEFORE MEALS 05/01/20  Yes Tahiliani, Margretta Sidle B, MD  potassium chloride SA (KLOR-CON) 20 MEQ tablet Take 1 tablet (20 mEq total) by mouth daily. 08/12/20  Yes Corcoran, Drue Second, MD  predniSONE (DELTASONE) 20 MG tablet Take 1 tablet (20 mg total) by mouth daily for 5 days. 11/26/20 12/01/20 Yes Danton Clap, PA-C  Tiotropium Bromide Monohydrate (SPIRIVA RESPIMAT) 2.5 MCG/ACT AERS Inhale 2 puffs into the lungs daily for 1 day. 08/23/20 08/24/20 Yes Brand Males, MD  fluticasone (FLONASE) 50 MCG/ACT nasal spray Place 2 sprays into both nostrils daily.    [provider]  latanoprost (XALATAN) 0.005 % ophthalmic solution  12/18/19   [provider]  methocarbamol (ROBAXIN) 500 MG tablet Take 500 mg by mouth every 6 (six) hours as needed.  06/08/20   [provider]  ondansetron (ZOFRAN ODT) 4 MG disintegrating tablet Take 1 tablet (4 mg total) by mouth every 8 (eight) hours as needed for nausea or vomiting. 05/20/20   Lequita Asal, MD  oxyCODONE (OXY IR/ROXICODONE) 5 MG immediate release tablet Take 5 mg by mouth every 6 (six) hours as needed.  06/08/20   [provider]  Pancrelipase, Lip-Prot-Amyl, (ZENPEP) 40000-126000 units CPEP TAKE 2 CAPSULES BY MOUTH EACH MEAL AND 1CAPSULE WITH EACH SNACK 08/05/20   Lin Landsman, MD  PARoxetine (PAXIL) 20 MG tablet TAKE 1 TABLET BY MOUTH ONCE DAILY 10/09/20   Venia Carbon, MD    Family History Family History  Problem Relation Age of Onset  . Cervical cancer Mother   . Anesthesia problems Mother        post-op N/V  . Rheum arthritis Mother   . Anxiety disorder Mother   . Cancer Father        colon cancer  . Migraines Sister   . Cervical cancer Sister   . Anesthesia problems Sister  post-op N/V  . Migraines Brother   . Asthma Daughter   . Cerebral palsy Daughter        age 72  . Coronary artery disease Maternal Grandmother   . Hypertension Maternal Grandmother   . Diabetes Maternal Grandmother   . Coronary artery disease Paternal Grandmother   . Hypertension Paternal Grandmother   . Diabetes Paternal Grandmother   . Breast cancer Maternal Aunt   . Breast cancer Maternal Aunt   . Breast cancer Maternal Aunt   . Colon cancer Paternal Uncle   . Lung cancer Paternal Uncle     Social History Social History   Tobacco Use  . Smoking status: Current Every Day Smoker    Packs/day: 1.00    Years: 42.75    Pack years: 42.75    Types: Cigarettes  . Smokeless tobacco: Never Used  Vaping Use  . Vaping Use: Never used  Substance Use Topics  . Alcohol use: Yes    Alcohol/week: 0.0 standard drinks    Comment: rare  . Drug use: Yes    Types: Marijuana    Comment: per pt she smoke thc at bedtime and prn  ( for pain relief and to help sleep since not given percocet anymore )-per pt      Allergies   Carbamazepine, Divalproex sodium, Clonazepam, Diphenhydramine hcl, Tiotropium bromide monohydrate, and Vicodin [hydrocodone-acetaminophen]   Review of Systems Review of Systems  Constitutional: Positive for fatigue. Negative for chills, diaphoresis and fever.  HENT: Positive for rhinorrhea and sore throat. Negative for congestion, ear pain, sinus pressure and sinus pain.   Respiratory: Positive for cough and shortness of breath.  Negative for wheezing.   Cardiovascular: Negative for chest pain.  Gastrointestinal: Negative for abdominal pain, nausea and vomiting.  Musculoskeletal: Positive for myalgias. Negative for arthralgias.  Skin: Negative for rash.  Neurological: Negative for weakness and headaches.  Hematological: Negative for adenopathy.     Physical Exam Triage Vital Signs ED Triage Vitals  Enc Vitals Group     BP 11/26/20 1636 (!) 185/108     Pulse Rate 11/26/20 1636 69     Resp 11/26/20 1636 18     Temp 11/26/20 1636 98.3 F (36.8 C)     Temp Source 11/26/20 1636 Oral     SpO2 11/26/20 1636 99 %     Weight 11/26/20 1634 95 lb 0.3 oz (43.1 kg)     Height 11/26/20 1634 5\' 1"  (1.549 m)     Head Circumference --      Peak Flow --      Pain Score 11/26/20 1633 5     Pain Loc --      Pain Edu? --      Excl. in [place]? --    No data found.  Updated Vital Signs BP (!) 153/99 (BP Location: Right Arm)   Pulse 69   Temp 98.3 F (36.8 C) (Oral)   Resp 18   Ht 5\' 1"  (1.549 m)   Wt 95 lb 0.3 oz (43.1 kg)   SpO2 99%   BMI 17.95 kg/m    Physical Exam Vitals and nursing note reviewed.  Constitutional:      General: She is not in acute distress.    Appearance: Normal appearance. She is not ill-appearing or toxic-appearing.  HENT:     Head: Normocephalic and atraumatic.     Nose: Rhinorrhea (trace clear drainage) present.     Mouth/Throat:     Mouth: Mucous membranes are moist.  Pharynx: Oropharynx is clear. Posterior oropharyngeal erythema (mild) present.  Eyes:     General: No scleral icterus.       Right eye: No discharge.        Left eye: No discharge.     Conjunctiva/sclera: Conjunctivae normal.  Cardiovascular:     Rate and Rhythm: Normal rate and regular rhythm.     Heart sounds: Normal heart sounds.  Pulmonary:     Effort: Pulmonary effort is normal. No respiratory distress.     Breath sounds: Normal breath sounds. No wheezing, rhonchi or rales.  Musculoskeletal:     Cervical  back: Neck supple.  Skin:    General: Skin is dry.  Neurological:     General: No focal deficit present.     Mental Status: She is alert. Mental status is at baseline.     Motor: No weakness.     Gait: Gait normal.  Psychiatric:        Mood and Affect: Mood normal.        Behavior: Behavior normal.        Thought Content: Thought content normal.      UC Treatments / Results  Labs (all labs ordered are listed, but only abnormal results are displayed) Labs Reviewed - No data to display  EKG   Radiology DG Chest 2 View  Result Date: 11/26/2020 CLINICAL DATA:  Cough for 2 weeks COVID EXAM: CHEST - 2 VIEW COMPARISON:  06/22/2018, PET CT 05/15/2020 FINDINGS: Surgical hardware in the cervical and lumbar spine and bilateral shoulders. Scoliosis. No focal consolidation or effusion. Normal cardiomediastinal silhouette with aortic atherosclerosis. No pneumothorax. IMPRESSION: No active cardiopulmonary disease. Electronically Signed   By: Donavan Foil M.D.   On: 11/26/2020 17:39    Procedures Procedures (including critical care time)  Medications Ordered in UC Medications - No data to display  Initial Impression / Assessment and Plan / UC Course  I have reviewed the triage vital signs and the nursing notes.  Pertinent labs & imaging results that were available during my care of the patient were reviewed by me and considered in my medical decision making (see chart for details).   60 year old female with personal history of COVID-19 diagnosed about 2 weeks ago presents with continued symptoms. She does have COPD and has been using her albuterol and Spiriva inhalers as well as taking over-the-counter cough medication without improvement in her symptoms. Denies any worsening of symptoms but says it has not really gotten any better.  Blood pressure is little elevated 153/99. She is afebrile. Oxygen saturations 99%. Exam shows that she is in no acute distress and is well-appearing. Her  chest is clear to auscultation. She has mild nasal congestion and rhinorrhea as well as injected posterior pharynx.  Chest x-ray obtained today.  CXR reviewed by me. Chest x-ray normal.  Treating patient for COPD exacerbation with doxycycline and low-dose prednisone.  Also sent Cheratussin to pharmacy.  Advise supportive care with increasing rest and fluids. Advised to continue at home inhalers. ED precautions reviewed with patient.   Final Clinical Impressions(s) / UC Diagnoses   Final diagnoses:  COPD exacerbation ([place])  Cough     Discharge Instructions     Your chest x-ray is normal today.  It looks like you are having an exacerbation of your COPD.  I have sent an antibiotic, prednisone and a cough medication for you to take.  Follow-up with [place] or your PCP if you are not feeling better over the next  week.  Go to emergency department if you have severe acute worsening of your breathing or any chest pain or weakness.  Increase your rest and fluid intake.  Continue your at home inhalers.    ED Prescriptions    Medication Sig Dispense Auth. Provider   predniSONE (DELTASONE) 20 MG tablet Take 1 tablet (20 mg total) by mouth daily for 5 days. 5 tablet Danton Clap, PA-C   guaiFENesin-codeine (ROBITUSSIN AC) 100-10 MG/5ML syrup Take 10 mLs by mouth 4 (four) times daily as needed for up to 7 days for cough. 120 mL Laurene Footman B, PA-C   doxycycline (VIBRAMYCIN) 100 MG capsule Take 1 capsule (100 mg total) by mouth 2 (two) times daily for 7 days. 14 capsule Danton Clap, PA-C     I have reviewed the PDMP during this encounter.   Danton Clap, PA-C 11/26/20 1753

## 2020-11-27 NOTE — Telephone Encounter (Signed)
Spoke to pt. She is doing a little better today. Moving around a little more.

## 2020-11-28 NOTE — Progress Notes (Signed)
Visit canceled

## 2020-12-02 ENCOUNTER — Telehealth: Payer: Self-pay | Admitting: Hematology and Oncology

## 2020-12-02 ENCOUNTER — Inpatient Hospital Stay: Payer: Medicare HMO

## 2020-12-02 NOTE — Telephone Encounter (Signed)
Patient just called and said her husband has COVID and she is coughing but to be cautious she is to call back when symptoms has susided and tested.

## 2020-12-04 ENCOUNTER — Other Ambulatory Visit: Payer: Self-pay | Admitting: Internal Medicine

## 2020-12-04 NOTE — Telephone Encounter (Signed)
Last filled 10-24-20 #90 Last OV 11-26-20 acute Next OV 06-30-21 [facility]

## 2020-12-18 ENCOUNTER — Other Ambulatory Visit: Payer: Self-pay | Admitting: *Deleted

## 2020-12-18 DIAGNOSIS — K219 Gastro-esophageal reflux disease without esophagitis: Secondary | ICD-10-CM

## 2020-12-18 MED ORDER — ONDANSETRON 4 MG PO TBDP: 4.0000 mg | ORAL_TABLET | Freq: Three times a day (TID) | ORAL | 0 refills | Status: DC | PRN

## 2020-12-30 ENCOUNTER — Inpatient Hospital Stay: Payer: Medicare HMO | Attending: Hematology and Oncology

## 2020-12-30 ENCOUNTER — Other Ambulatory Visit: Payer: Self-pay

## 2020-12-30 DIAGNOSIS — E538 Deficiency of other specified B group vitamins: Secondary | ICD-10-CM | POA: Insufficient documentation

## 2020-12-30 DIAGNOSIS — K922 Gastrointestinal hemorrhage, unspecified: Secondary | ICD-10-CM | POA: Insufficient documentation

## 2020-12-30 DIAGNOSIS — D500 Iron deficiency anemia secondary to blood loss (chronic): Secondary | ICD-10-CM | POA: Insufficient documentation

## 2020-12-30 MED ORDER — CYANOCOBALAMIN 1000 MCG/ML IJ SOLN
1000.0000 ug | Freq: Once | INTRAMUSCULAR | Status: AC
  Administered 2020-12-30: 1000 ug via INTRAMUSCULAR
  Filled 2020-12-30: qty 1

## 2021-01-03 ENCOUNTER — Other Ambulatory Visit: Payer: Self-pay | Admitting: Internal Medicine

## 2021-01-08 ENCOUNTER — Other Ambulatory Visit: Payer: Self-pay | Admitting: *Deleted

## 2021-01-08 MED ORDER — SPIRIVA RESPIMAT 2.5 MCG/ACT IN AERS: 2.0000 | INHALATION_SPRAY | Freq: Every day | RESPIRATORY_TRACT | 11 refills | Status: DC

## 2021-01-08 NOTE — Telephone Encounter (Signed)
Patient called stating that she has been trying to get a refill on her Spiriva and can not get anyone at her pulmonologist office to answer the phone. Patient stated that Dr. Silvio Pate sent in an inhaler for her but that wasn't the right one. Patient stated that she is going to hold on the the albertol. Patient wants to know if Dr. Silvio Pate will refill her Spiriva. Last office visit 11/26/20 video,  Last refill 08/23/20  4 G  Dr. Chase [facility]

## 2021-01-09 ENCOUNTER — Other Ambulatory Visit: Payer: Self-pay | Admitting: Internal Medicine

## 2021-01-09 NOTE — Telephone Encounter (Signed)
Last filled 12-05-20 #90 Last Ov Acute 11-26-20 Next OV 06-30-21 [facility]

## 2021-01-23 NOTE — Progress Notes (Signed)
[facility]  [address] [place], [place] [address] Phone: [phone]  Fax: [phone]   Clinic Day:  01/27/2021  Referring physician: Venia Carbon, MD  Chief Complaint: Molly Peters is a 60 y.o. female with iron deficiency anemia, B12 deficiency, andunexplained weight loss who is seen for 6 month assessment.  HPI:  The patient was last seen in the hematology clinic on 08/12/2020.  At that time,  she felt "fine". She had not had any episodes of rectal bleeding. She had diarrhea that day. She had not taken her pancreatic enzymes in 4 days.  Exam was stable. Hematocrit was 40.6, hemoglobin 14.3, platelets 312,000, WBC 7,000. Potassium was 3.2. Ferritin was 21.  She was prescribed potassium 20 mEq x 3 days. She was encouraged to follow up with GI.   She received vitamin B12 injections monthly x 5 (08/12/2020 - 12/30/2020).  PFTs on 10/08/2020 revealed no obvious evidence of obstructive airways disease or restrictive lung disease.  The patient was seen in Urgent Care on 11/26/2020 for cough, fatigue, and body aches. She reported that her husband had COVID-19 a couple of weeks prior but the patient never tested positive. CXR showed no active cardiopulmonary disease. She was treated for COPD exacerbation with doxycycline, low dose prednisone, and Cheratussin.  Labs on 11/04/2020 revealed a hematocrit of 44.5, hemoglobin 15.7, platelets 337,000, WBC 8,000. Ferritin was 15. Folate was 10.1.  During the interim, she has been "tired" which is normal for her. She had some bright red rectal bleeding about 3 weeks ago. She has not seen GI about this. Her bowels fluctuate between constipation and diarrhea. She denies any other bleeding. Her nausea is managed with Zofran. She occasionally has abdominal pain  That goes away when she has a bowel movement.  She has on and off headaches. Her neck and back pain are stable.  She is seeing her PCP tomorrow.  She weighs herself one a week and got up to 98 lbs recently. She eats "like a pig" and is not sure why she is losing weight.   Past Medical History:  Diagnosis Date  . Anxiety   . Cervical cancer ([place]) 1980's  . Chronic back pain 11/12/2009   Qualifier: Diagnosis of  By: Maxie Better FNP, Rosalita Levan   . COPD (chronic obstructive pulmonary disease) (HCC)    states SOB with ADLs; no O2 use; able to speak in complete sentences without SOB(11/15/2014)  . Depression   . Difficulty swallowing pills    s/p cervical fusion  . Family history of adverse reaction to anesthesia    pt's mother and sister have hx. of post-op N/V  . Fibromyalgia   . GERD (gastroesophageal reflux disease)   . Heart murmur   . History of gastric ulcer   . Hypertension    states under control with med., has been on med. x 1-2 yr.  . IBS (irritable bowel syndrome)   . Internal hemorrhoid    states has had intermittent bright red bleeding with BM (11/15/2014)  . Intraductal papilloma of breast, right 08/08/2018  . Limited joint range of motion    neck - s/p cervical fusion  . Localized primary osteoarthritis of right shoulder region 11/23/2014  . Osteoarthritis of left shoulder 07/05/2015  . Osteoarthritis of right shoulder 11/2014  . Pneumonia   . Seizures ([place])    last seizure 2010  . Short-term memory loss   . TMJ syndrome   . Valvular heart disease  Initial workup a number of years ago at [facility].  Echo (10/2009) showed EF 60-65%,      normal LV size, moderate aortic insufficiency with a trileaflet aortic valve and normal aortic root size.  Mild      mitral regurgitation.   . Wears dentures    lower    Past Surgical History:  Procedure Laterality Date  . ABDOMINAL HYSTERECTOMY     partial  . ANTERIOR CERVICAL DECOMP/DISCECTOMY FUSION  02/06/2011   C4-5, C5-6, C6-7  . BREAST BIOPSY Bilateral 10+ yrs ago   neg  . BREAST BIOPSY Right 08/03/2018   2 areas bx, -neg  . BREAST LUMPECTOMY Bilateral 1989   x 3 -  benign  . BREAST LUMPECTOMY Right 08/24/2018   Procedure: BREAST LUMPECTOMY WITH ULTRASOUND IN OR;  Surgeon: Vickie Epley, MD;  Location: [facility] ORS;  Service: General;  Laterality: Right;  . CARDIAC CATHETERIZATION  1995  . CESAREAN SECTION     x 2  . COLONOSCOPY  09/28/2008  . COLONOSCOPY N/A 06/15/2018   Procedure: COLONOSCOPY;  Surgeon: Virgel Manifold, MD;  Location: [facility] ENDOSCOPY;  Service: Endoscopy;  Laterality: N/A;  . COLONOSCOPY WITH PROPOFOL N/A 05/04/2019   Procedure: COLONOSCOPY WITH PROPOFOL;  Surgeon: Virgel Manifold, MD;  Location: [facility] ENDOSCOPY;  Service: Endoscopy;  Laterality: N/A;  . ESOPHAGOGASTRODUODENOSCOPY  09/28/2008  . ESOPHAGOGASTRODUODENOSCOPY N/A 06/14/2018   Procedure: ESOPHAGOGASTRODUODENOSCOPY (EGD);  Surgeon: Virgel Manifold, MD;  Location: [facility] ENDOSCOPY;  Service: Endoscopy;  Laterality: N/A;  . ESOPHAGOGASTRODUODENOSCOPY (EGD) WITH PROPOFOL N/A 05/04/2019   Procedure: ESOPHAGOGASTRODUODENOSCOPY (EGD) WITH PROPOFOL;  Surgeon: Virgel Manifold, MD;  Location: [facility] ENDOSCOPY;  Service: Endoscopy;  Laterality: N/A;  . ESOPHAGOGASTRODUODENOSCOPY (EGD) WITH PROPOFOL N/A 03/18/2020   Procedure: ESOPHAGOGASTRODUODENOSCOPY (EGD) WITH PROPOFOL;  Surgeon: Lin Landsman, MD;  Location: [facility] ENDOSCOPY;  Service: Gastroenterology;  Laterality: N/A;  . HARDWARE REMOVAL  11/17/2006   L5-S1  . LAMINECTOMY WITH POSTERIOR LATERAL ARTHRODESIS LEVEL 3  12/06/2009   with synovial cyst resection L3-4 bilat.  Marland Kitchen LAMINECTOMY WITH POSTERIOR LATERAL ARTHRODESIS LEVEL 3  11/17/2006   L4-5  . NM MYOVIEW LTD     Lexiscan myoview (10/2009): EF 77%, normal wall motion, normal perfusion.   Marland Kitchen SHOULDER ARTHROSCOPY WITH DEBRIDEMENT AND BICEP TENDON REPAIR Right 04/13/2014   Procedure: RIGHT SHOULDER ARTHROSCOPY WITH DEBRIDEMENT EXTENSIVE;  Surgeon: Johnny Bridge, MD;  Location: [place];  Service: Orthopedics;  Laterality: Right;  . TOTAL SHOULDER  ARTHROPLASTY Right 11/23/2014   Procedure: TOTAL RIGHT SHOULDER ARTHROPLASTY;  Surgeon: Johnny Bridge, MD;  Location: [place];  Service: Orthopedics;  Laterality: Right;  . TOTAL SHOULDER ARTHROPLASTY Left 07/05/2015   Procedure: LEFT TOTAL SHOULDER REPLACEMENT;  Surgeon: Marchia Bond, MD;  Location: [place];  Service: Orthopedics;  Laterality: Left;    Family History  Problem Relation Age of Onset  . Cervical cancer Mother   . Anesthesia problems Mother        post-op N/V  . Rheum arthritis Mother   . Anxiety disorder Mother   . Cancer Father        colon cancer  . Migraines Sister   . Cervical cancer Sister   . Anesthesia problems Sister        post-op N/V  . Migraines Brother   . Asthma Daughter   . Cerebral palsy Daughter        age 15  . Coronary artery disease Maternal Grandmother   .  Hypertension Maternal Grandmother   . Diabetes Maternal Grandmother   . Coronary artery disease Paternal Grandmother   . Hypertension Paternal Grandmother   . Diabetes Paternal Grandmother   . Breast cancer Maternal Aunt   . Breast cancer Maternal Aunt   . Breast cancer Maternal Aunt   . Colon cancer Paternal Uncle   . Lung cancer Paternal Uncle     Social History:  reports that she has been smoking cigarettes. She has a 42.75 pack-year smoking history. She has never used smokeless tobacco. She reports current alcohol use. She reports current drug use. Drug: Marijuana. She is currently smoking half a pack a day.She drinks "a hair" every now and then. Patient has been on disability in 2005 for "ruptured back". She is a former [employer]. The patient is alone today.  Allergies:  Allergies  Allergen Reactions  . Carbamazepine Hives  . Divalproex Sodium Swelling and Other (See Comments)    HAIR FALLS OUT  . Clonazepam Other (See Comments)    HALLUCINATIONS  . Diphenhydramine Hcl Rash  . Tiotropium Bromide Monohydrate Other (See Comments)    CREATES YEAST  IN THROAT  . Vicodin [Hydrocodone-Acetaminophen] Itching    Current Medications: Current Outpatient Medications  Medication Sig Dispense Refill  . albuterol (VENTOLIN HFA) 108 (90 Base) MCG/ACT inhaler INHALE 2 PUFFS BY MOUTH EVERY 6 HOURS ASNEEDED WHEEEZING/ SHORTNESS OF BREATH 8.5 g 1  . ALPRAZolam (XANAX) 1 MG tablet TAKE 1/2 TO 1 TABLET BY MOUTH 3 TIMES DAILY AS NEEDED ANXIETY 90 tablet 0  . fluticasone (FLONASE) 50 MCG/ACT nasal spray Place 2 sprays into both nostrils daily.    Marland Kitchen lamoTRIgine (LAMICTAL) 100 MG tablet Take 1 tablet (100 mg total) by mouth 2 (two) times daily. 180 tablet 3  . latanoprost (XALATAN) 0.005 % ophthalmic solution     . losartan-hydrochlorothiazide (HYZAAR) 100-12.5 MG tablet TAKE 1 TABLET BY MOUTH ONCE DAILY 90 tablet 3  . omeprazole (PRILOSEC) 40 MG capsule TAKE 1 CAPSULE BY MOUTH TWICE DAILY BEFORE MEALS 60 capsule 1  . ondansetron (ZOFRAN ODT) 4 MG disintegrating tablet Take 1 tablet (4 mg total) by mouth every 8 (eight) hours as needed for nausea or vomiting. 20 tablet 0  . PARoxetine (PAXIL) 20 MG tablet TAKE 1 TABLET BY MOUTH ONCE DAILY 90 tablet 2  . Tiotropium Bromide Monohydrate (SPIRIVA RESPIMAT) 2.5 MCG/ACT AERS Inhale 2 puffs into the lungs daily for 1 day. 4 g 11  . methocarbamol (ROBAXIN) 500 MG tablet Take 500 mg by mouth every 6 (six) hours as needed.  (Patient not taking: Reported on 01/27/2021)    . oxyCODONE (OXY IR/ROXICODONE) 5 MG immediate release tablet Take 5 mg by mouth every 6 (six) hours as needed.  (Patient not taking: Reported on 01/27/2021)    . Pancrelipase, Lip-Prot-Amyl, (ZENPEP) 40000-126000 units CPEP TAKE 2 CAPSULES BY MOUTH EACH MEAL AND 1CAPSULE WITH EACH SNACK 240 capsule 1  . potassium chloride SA (KLOR-CON) 20 MEQ tablet Take 1 tablet (20 mEq total) by mouth daily. (Patient not taking: Reported on 01/27/2021) 3 tablet 0   No current facility-administered medications for this visit.    Review of Systems  Constitutional:  Positive for malaise/fatigue. Negative for chills, diaphoresis, fever and weight loss (stable).  HENT: Negative for congestion, ear discharge, ear pain, hearing loss, nosebleeds, sinus pain, sore throat and tinnitus.   Eyes: Negative for blurred vision.  Respiratory: Negative for cough, hemoptysis, sputum production and shortness of breath.   Cardiovascular: Negative.  Negative for  chest pain, palpitations and leg swelling.  Gastrointestinal: Positive for abdominal pain, blood in stool (3 weeks ago), constipation, diarrhea and nausea (on Zofran). Negative for heartburn, melena and vomiting.  Genitourinary: Negative.  Negative for dysuria, frequency, hematuria and urgency.  Musculoskeletal: Positive for back pain and neck pain (LEFT side; pain increases with movement, improved). Negative for joint pain and myalgias.  Skin: Negative.  Negative for itching and rash.  Neurological: Positive for headaches (on and off). Negative for dizziness, tingling, sensory change and weakness.  Endo/Heme/Allergies: Does not bruise/bleed easily.  Psychiatric/Behavioral: Negative for depression and memory loss. The patient is not nervous/anxious and does not have insomnia.   All other systems reviewed and are negative.   Performance status (ECOG): 1  Vitals Blood pressure (!) 171/98, pulse 82, temperature 97.7 F (36.5 C), temperature source Tympanic, resp. rate 18, weight 93 lb 7.6 oz (42.4 kg), SpO2 97 %.   Physical Exam Vitals and nursing note reviewed.  Constitutional:      General: She is not in acute distress.    Appearance: She is well-developed. She is not diaphoretic.     Comments: Thin woman sitting comfortably in the exam room in no acute distress.   HENT:     Head: Normocephalic and atraumatic.     Comments: Long graying hair.    Mouth/Throat:     Pharynx: No oropharyngeal exudate.  Eyes:     General: No scleral icterus.    Conjunctiva/sclera: Conjunctivae normal.     Pupils: Pupils are  equal, round, and reactive to light.     Comments: Glasses.  Blue eyes.   Cardiovascular:     Rate and Rhythm: Normal rate and regular rhythm.     Heart sounds: Normal heart sounds. No murmur heard.   Pulmonary:     Effort: Pulmonary effort is normal. No respiratory distress.     Breath sounds: Normal breath sounds. No wheezing or rales.  Chest:  Breasts:     Right: No axillary adenopathy or supraclavicular adenopathy.     Left: No axillary adenopathy or supraclavicular adenopathy.    Abdominal:     General: Bowel sounds are normal. There is no distension.     Palpations: Abdomen is soft. There is no hepatomegaly, splenomegaly or mass.     Tenderness: There is abdominal tenderness (left side with palpation). There is no guarding or rebound.  Musculoskeletal:        General: No tenderness. Normal range of motion.     Cervical back: Normal range of motion and neck supple.  Lymphadenopathy:     Head:     Right side of head: No preauricular, posterior auricular or occipital adenopathy.     Left side of head: No preauricular, posterior auricular or occipital adenopathy.     Cervical: No cervical adenopathy.     Upper Body:     Right upper body: No supraclavicular or axillary adenopathy.     Left upper body: No supraclavicular or axillary adenopathy.     Lower Body: No right inguinal adenopathy. No left inguinal adenopathy.  Skin:    General: Skin is warm and dry.  Neurological:     Mental Status: She is alert and oriented to person, place, and time.  Psychiatric:        Behavior: Behavior normal.        Thought Content: Thought content normal.        Judgment: Judgment normal.    Appointment on 01/27/2021  Component Date Value  Ref Range Status  . Sodium 01/27/2021 138  135 - 145 mmol/L Final  . Potassium 01/27/2021 3.4* 3.5 - 5.1 mmol/L Final  . Chloride 01/27/2021 101  98 - 111 mmol/L Final  . CO2 01/27/2021 25  22 - 32 mmol/L Final  . Glucose, Bld 01/27/2021 107* 70 - 99  mg/dL Final   Glucose reference range applies only to samples taken after fasting for at least 8 hours.  . BUN 01/27/2021 13  8 - 23 mg/dL Final  . Creatinine, Ser 01/27/2021 0.81  0.44 - 1.00 mg/dL Final  . Calcium 01/27/2021 9.7  8.9 - 10.3 mg/dL Final  . Total Protein 01/27/2021 8.3* 6.5 - 8.1 g/dL Final  . Albumin 01/27/2021 4.6  3.5 - 5.0 g/dL Final  . AST 01/27/2021 26  15 - 41 U/L Final  . ALT 01/27/2021 12  0 - 44 U/L Final  . Alkaline Phosphatase 01/27/2021 72  38 - 126 U/L Final  . Total Bilirubin 01/27/2021 0.5  0.3 - 1.2 mg/dL Final  . GFR, Estimated 01/27/2021 >60  >60 mL/min Final   Comment: (NOTE) Calculated using the CKD-EPI Creatinine Equation (2021)   . Anion gap 01/27/2021 12  5 - 15 Final   Performed at [facility], [address], [place], [place] [address]  . WBC 01/27/2021 8.4  4.0 - 10.5 K/uL Final  . RBC 01/27/2021 4.73  3.87 - 5.11 MIL/uL Final  . Hemoglobin 01/27/2021 15.8* 12.0 - 15.0 g/dL Final  . HCT 01/27/2021 44.3  36.0 - 46.0 % Final  . MCV 01/27/2021 93.7  80.0 - 100.0 fL Final  . MCH 01/27/2021 33.4  26.0 - 34.0 pg Final  . MCHC 01/27/2021 35.7  30.0 - 36.0 g/dL Final  . RDW 01/27/2021 12.5  11.5 - 15.5 % Final  . Platelets 01/27/2021 347  150 - 400 K/uL Final  . nRBC 01/27/2021 0.0  0.0 - 0.2 % Final  . Neutrophils Relative % 01/27/2021 47  % Final  . Neutro Abs 01/27/2021 3.9  1.7 - 7.7 K/uL Final  . Lymphocytes Relative 01/27/2021 43  % Final  . Lymphs Abs 01/27/2021 3.6  0.7 - 4.0 K/uL Final  . Monocytes Relative 01/27/2021 8  % Final  . Monocytes Absolute 01/27/2021 0.7  0.1 - 1.0 K/uL Final  . Eosinophils Relative 01/27/2021 1  % Final  . Eosinophils Absolute 01/27/2021 0.1  0.0 - 0.5 K/uL Final  . Basophils Relative 01/27/2021 1  % Final  . Basophils Absolute 01/27/2021 0.1  0.0 - 0.1 K/uL Final  . Immature Granulocytes 01/27/2021 0  % Final  . Abs Immature Granulocytes 01/27/2021 0.02  0.00 - 0.07 K/uL Final   Performed  at [facility], [address], [place], [place] [address]    Assessment:  Molly Peters is a 60 y.o. female withiron deficiency anemiasecondary to an upper GI bleed. She presented with intermittent melena and chest pain. Hemoglobin nadir was 6.9 on 06/14/2018.Ferritin was 9, iron saturation 11% and TIBC 393 on 06/20/2018. She received 1 unit of PRBCs.  Work-up on 08/27/2019revealed a hematocrit of 31, hemoglobin 10.2, and MCV 70.8. Ferritin was 7. B12 was 165 (low). Folate was 6.3 (>5.9). TSH was 1.256.  Ferritinhas been followed: 9 on 06/20/2018, 7 on 07/05/2018, 5 on 07/19/2018, 81 on 09/12/2018, 12 on 10/24/2018, 71 on 02/07/2019, 51 on 05/09/2019, 32 on 07/28/2019, 29 on 08/28/2019, 15 on 01/24/2020, 36 on 04/18/2020, 21 on 08/12/2020, and 15 on 11/04/2020.  She received Venofer200 mg on 07/25/2018, weekly x 3 (08/15/2018 - 08/29/2018), weekly x 2 (01/10/2019 - 01/17/2019), and 01/26/2020.  She has B12 deficiency. She began oral B12on 07/06/2018. B12 was 165 on 07/05/2018 and 246 on 10/24/2018 (on oral B12). Folatewas 10.1 on 11/04/2020. She began monthly B12on 08/29/2018 (last02/21/2021).  EGDon 06/14/2018 revealed esophageal mucosal changes c/w short segment of Barrett's and a non-bleeding gastric ulcers. Pathology at the GE junction revealed squamocolumnar mucosa with moderate chronic inflammation. Gastric biopsy revealed no H pylori, dysplasia, or malignancy. EGDon 05/04/2019 revealed non-bleeding gastric ulcer with a clean ulcer base (Forrest Class III)and friable gastric mucosa. Biopsies revealed antral mild reactive gastropathy with no H pylori, metaplasia, dysplasia or malignancy.  Colonoscopyon 06/15/2018 revealed a 5 mm polyp in the sigmoid colon (tubular adenoma). Colonoscopyon 05/04/2019 revealed three 4-5 mm polyps in the sigmoid colon (hyperplastic polyps) and diverticulosis in the sigmoid colon.   Chest CT angiogramon  06/22/2018 reveled no pulmonary embolism. There was underlying emphysematous changes. There were small LUL pulmonary nodular changes (largest 3 mm). There was no thoracic adenopathy.   CT angiogramof the abdomen and pelvison 09/30/2019revealed no evidence of abdominal aortic aneurysm or dissection. No findings to suggest active GI bleeding on CT. There was non-vascular wall thickening involving the transverse colon extending to the splenic flexure, suspicious for infectious/inflammatory colitis. There was associated trace pelvic ascites. There was no pneumatosis or free air. C diff and GI panel by PCR was negative. She was treated with Flagyl and Augmentin.  Chest, abdomen and pelvis CT on 04/12/02021 revealed no evidence of malignancy within the chest, abdomen or pelvis to account for patient's weight loss.  There were no acute findings.  There were a few tiny bilateral pulmonary nodules none greater than 3 mm in size without significant change from the prior exam.  There was mild uniform thickening of the gastric antrum which may be secondary to peristalsis/spasm, although gastritis/antritis was possible.  PET scan on 05/15/2020 revealed no definite signs of malignancy in the neck, chest, abdomen or pelvis. There was an equivocal area of variable uptake with some fullness in the LEFT glossotonsillar region and parapharyngeal region. This could relate to prior spinal surgery and apparent uptake pattern could be affected by the abundant artifact from cervical hardware, dental hardware and bilateral shoulder arthroplasty changes.  BILATERAL mammogramon 07/27/2018 revealed 2 indeterminate masses in the RIGHT breast at the 2:30 and 3 o'clock positions. Follow up breast ultrasoundsshows a 6 x 4 x 5 mm oval hypoechoic mass, 1 cm from the nipple, in the 2:30 position. In the 3 o'clock position, again 1 cm from the nipple, the was a 6 x 3 x 3 mm oval hypoechoic mass. LEFT breast ultrasound showed  normal fibroglandular tissue at the 10 o'clock position. Ultrasound guided needle biopsy with clip placement on 08/03/2018. Pathology revealed revealed a dilated duct/cyst with sclerosis of the wall in the 2:30 lesion. Sample negative for atypia and malignancy. Lesion in the 3 o'clock position revealed an intraductal papilloma. Samplewasnegative for atypia and malignancy.  Right breast lumpectomy on 08/24/2018 revealed an intraductal papillomawith adjacent biopsy change. There was sclerotic changes suggestive of cyst/duct wall with adjacent biopsy change. There was duct ectasia and cysts in the tissue between the 2 biopsy sites. There was no atypia or malignancy.  Screening bilateral mammogramon 08/16/2019 revealed no evidence of malignancy.   Symptomatically, she feels "tired". She had some bright red rectal bleeding about 3 weeks ago.  Bowels fluctuate between constipation and diarrhea. She  denies any other bleeding. Her nausea is managed with Zofran. She occasionally has abdominal pain.  She eats well, but is losing weight.  Exam is stable.    Plan: 1.   Labs today: CBC with diff, CMP, ferritin. 2.Weight loss Current weight is 93 pounds (stable). Patient's baseline weight is105-108 pounds. Patient has intermittent diarrhea.   Diarrhea improved on pancreatic enzymes.   Diarrhea has returned secondary to running out of pancreatic enzyme Thyroid function is normal. Patient smokes.   Low dose chest CT 03/30/2019 revealed scattered small LLL nodules. CT angiogramof the abdomen and pelvison 09/30/2019revealedno masses.             Mammogram on 11/15/2018 revealed no evidence of malignancy. PET scan on 05/15/2020 revealed no definitive evidence of malignancy.   There was equivocal uptake in the LEFT glossotonsillar region and parapharyngeal region.   ENT evaluation revealed no  abnormality.  Continue to evaluate for underlying etiology. 3.Iron deficiency anemia Hematocrit 44.3.  Hemoglobin 15.8.  MCV 93.7 on 01/27/2021.  Ferritin 21 (available after clinic). Patient notes rectal bleeding 3 weeks ago. Encourage follow-up with GI. MD to contact Dr. Bonna Gains regarding rectal bleeding. Continue to monitor. 4.B12 deficiency Patient receives B12 injections. Last B12was on 12/30/2020. B12 today and monthly x6. Folate was 10.1 on 11/04/2020. 5.Pulmonary nodules Low dose chest CT on 03/30/2019 revealedscattered small(up to 3.9 mm)bilateral pulmonary nodules in the left lower lobe. Chest CT on 02/19/2020 revealed no evidence of maligancy.  She has emphysema and a few tiny pulmonary nodules (none > 3 mm). Continue low-dose chest CT program. 6. GI issues Patient notes diarrhea improved with pancreatic enzymes. Pancreatic enzymes are cost prohibitive. Follow-up with Dr. Bonna Gains regarding rectal bleeding. 7. Hypokalemia  Potassium 3.4.  Discuss potassium rich foods. 8.   Hypertension  BP 147/100.  RN:  Recheck BP. 9.   B12 today and monthly x 6. 10.   MD to contact Dr Bonna Gains re: rectal bleeding. 11.   Weight checks every other month x 6 months. 12.   RTC in 6 months for MD assessment, labs (CBC with diff, CMP, ferritin), and B12.  I discussed the assessment and treatment plan with the patient.  The patient was provided an opportunity to ask questions and all were answered.  The patient agreed with the plan and demonstrated an understanding of the instructions.  The patient was advised to call back if the symptoms worsen or if the condition fails to improve as anticipated.  I provided 20 minutes of face-to-face time during this this encounter and > 50% was spent counseling as documented under my assessment and plan.  An additional 8 minutes were spent reviewing her chart (Epic and Care Everywhere) including notes, labs, and imaging studies.     Lequita Asal, MD, PhD    01/27/2021, 1:47 PM  I, De Burrs, am acting as scribe for [employer]. Mike Gip, MD, PhD.  I, Bosco Paparella C. Mike Gip, MD, have reviewed the above documentation for accuracy and completeness, and I agree with the above.

## 2021-01-24 ENCOUNTER — Telehealth: Payer: Self-pay

## 2021-01-27 ENCOUNTER — Inpatient Hospital Stay: Payer: Medicare HMO | Attending: Hematology and Oncology | Admitting: Hematology and Oncology

## 2021-01-27 ENCOUNTER — Inpatient Hospital Stay: Payer: Medicare HMO

## 2021-01-27 ENCOUNTER — Other Ambulatory Visit: Payer: Self-pay

## 2021-01-27 ENCOUNTER — Encounter: Payer: Self-pay | Admitting: Hematology and Oncology

## 2021-01-27 ENCOUNTER — Other Ambulatory Visit: Payer: Self-pay | Admitting: *Deleted

## 2021-01-27 VITALS — BP 147/100 | HR 82 | Temp 97.7°F | Resp 18 | Wt 93.5 lb

## 2021-01-27 DIAGNOSIS — Z79899 Other long term (current) drug therapy: Secondary | ICD-10-CM | POA: Insufficient documentation

## 2021-01-27 DIAGNOSIS — D509 Iron deficiency anemia, unspecified: Secondary | ICD-10-CM | POA: Insufficient documentation

## 2021-01-27 DIAGNOSIS — E538 Deficiency of other specified B group vitamins: Secondary | ICD-10-CM

## 2021-01-27 DIAGNOSIS — D500 Iron deficiency anemia secondary to blood loss (chronic): Secondary | ICD-10-CM

## 2021-01-27 DIAGNOSIS — E876 Hypokalemia: Secondary | ICD-10-CM

## 2021-01-27 DIAGNOSIS — R911 Solitary pulmonary nodule: Secondary | ICD-10-CM | POA: Diagnosis not present

## 2021-01-27 DIAGNOSIS — R634 Abnormal weight loss: Secondary | ICD-10-CM

## 2021-01-27 DIAGNOSIS — R918 Other nonspecific abnormal finding of lung field: Secondary | ICD-10-CM | POA: Diagnosis not present

## 2021-01-27 DIAGNOSIS — Z87891 Personal history of nicotine dependence: Secondary | ICD-10-CM

## 2021-01-27 DIAGNOSIS — Z122 Encounter for screening for malignant neoplasm of respiratory organs: Secondary | ICD-10-CM

## 2021-01-27 DIAGNOSIS — F17200 Nicotine dependence, unspecified, uncomplicated: Secondary | ICD-10-CM

## 2021-01-27 LAB — CBC WITH DIFFERENTIAL/PLATELET
Abs Immature Granulocytes: 0.02 10*3/uL (ref 0.00–0.07)
Basophils Absolute: 0.1 10*3/uL (ref 0.0–0.1)
Basophils Relative: 1 %
Eosinophils Absolute: 0.1 10*3/uL (ref 0.0–0.5)
Eosinophils Relative: 1 %
HCT: 44.3 % (ref 36.0–46.0)
Hemoglobin: 15.8 g/dL — ABNORMAL HIGH (ref 12.0–15.0)
Immature Granulocytes: 0 %
Lymphocytes Relative: 43 %
Lymphs Abs: 3.6 10*3/uL (ref 0.7–4.0)
MCH: 33.4 pg (ref 26.0–34.0)
MCHC: 35.7 g/dL (ref 30.0–36.0)
MCV: 93.7 fL (ref 80.0–100.0)
Monocytes Absolute: 0.7 10*3/uL (ref 0.1–1.0)
Monocytes Relative: 8 %
Neutro Abs: 3.9 10*3/uL (ref 1.7–7.7)
Neutrophils Relative %: 47 %
Platelets: 347 10*3/uL (ref 150–400)
RBC: 4.73 MIL/uL (ref 3.87–5.11)
RDW: 12.5 % (ref 11.5–15.5)
WBC: 8.4 10*3/uL (ref 4.0–10.5)
nRBC: 0 % (ref 0.0–0.2)

## 2021-01-27 LAB — COMPREHENSIVE METABOLIC PANEL
ALT: 12 U/L (ref 0–44)
AST: 26 U/L (ref 15–41)
Albumin: 4.6 g/dL (ref 3.5–5.0)
Alkaline Phosphatase: 72 U/L (ref 38–126)
Anion gap: 12 (ref 5–15)
BUN: 13 mg/dL (ref 8–23)
CO2: 25 mmol/L (ref 22–32)
Calcium: 9.7 mg/dL (ref 8.9–10.3)
Chloride: 101 mmol/L (ref 98–111)
Creatinine, Ser: 0.81 mg/dL (ref 0.44–1.00)
GFR, Estimated: >60 mL/min (ref >60)
Glucose, Bld: 107 mg/dL — ABNORMAL HIGH (ref 70–99)
Potassium: 3.4 mmol/L — ABNORMAL LOW (ref 3.5–5.1)
Sodium: 138 mmol/L (ref 135–145)
Total Bilirubin: 0.5 mg/dL (ref 0.3–1.2)
Total Protein: 8.3 g/dL — ABNORMAL HIGH (ref 6.5–8.1)

## 2021-01-27 LAB — FERRITIN: Ferritin: 21 ng/mL (ref 11–307)

## 2021-01-27 MED ORDER — CYANOCOBALAMIN 1000 MCG/ML IJ SOLN
1000.0000 ug | Freq: Once | INTRAMUSCULAR | Status: AC
  Administered 2021-01-27: 1000 ug via INTRAMUSCULAR
  Filled 2021-01-27: qty 1

## 2021-01-27 NOTE — Progress Notes (Signed)
Patient here for oncology follow-up appointment, expresses concerns of n/v, shortness of breath , headaches and low back pain

## 2021-01-27 NOTE — Progress Notes (Signed)
Contacted and scheduled for annual lung screening scan. Patient is a current smoker with a 64.5 pack year history.

## 2021-01-27 NOTE — Progress Notes (Signed)
Rechecked BP 147/100, patient verbalizes she will notify PCP when she leaves

## 2021-02-12 ENCOUNTER — Telehealth: Payer: Self-pay

## 2021-02-12 NOTE — Telephone Encounter (Signed)
Spoke with patient who stated that for the last couple of weeks her muscles have been "drawn up and tight." Patient stated that she is not taking the Robaxin on her medication list because it doesn't work for her. Patient is wanting to know if she can have a muscle relaxer prescribed to her to help with this issue. Patient uses [facility]. Please advise.

## 2021-02-12 NOTE — Telephone Encounter (Signed)
Patient called and LVM on triage line stating that her muscles are "bound up" and she wanted Dr. Silvio Pate to call her in a prescription for a muscle relaxer. Called patient back and LVM for patient to call office back for further information.

## 2021-02-13 MED ORDER — TIZANIDINE HCL 2 MG PO TABS: 2.0000 mg | ORAL_TABLET | Freq: Four times a day (QID) | ORAL | 1 refills | Status: DC | PRN

## 2021-02-13 NOTE — Addendum Note (Signed)
Addended by: Viviana Simpler I on: 02/13/2021 12:54 PM   Modules accepted: Orders

## 2021-02-13 NOTE — Telephone Encounter (Signed)
Spoke to pt

## 2021-02-13 NOTE — Telephone Encounter (Signed)
Please let her know that I sent in a different muscle relaxer for her to try. If that doesn't help, I will probably need to see her before trying something else

## 2021-02-18 ENCOUNTER — Telehealth: Payer: Self-pay | Admitting: Gastroenterology

## 2021-02-18 NOTE — Telephone Encounter (Signed)
Message Received: Lanny Cramp, MD  Molly Peters I Please have pt come to clinic for evaluation of rectal bleeding    Called patient to schedule. LVM to call back to schedule.

## 2021-02-19 ENCOUNTER — Ambulatory Visit: Payer: Medicare HMO | Admitting: Gastroenterology

## 2021-02-19 ENCOUNTER — Other Ambulatory Visit: Payer: Self-pay

## 2021-02-19 ENCOUNTER — Encounter: Payer: Self-pay | Admitting: Gastroenterology

## 2021-02-19 VITALS — BP 155/89 | HR 80 | Temp 98.2°F | Ht 61.0 in | Wt 94.8 lb

## 2021-02-19 DIAGNOSIS — E164 Increased secretion of gastrin: Secondary | ICD-10-CM

## 2021-02-19 DIAGNOSIS — K8681 Exocrine pancreatic insufficiency: Secondary | ICD-10-CM | POA: Diagnosis not present

## 2021-02-19 DIAGNOSIS — K602 Anal fissure, unspecified: Secondary | ICD-10-CM

## 2021-02-19 MED ORDER — PANCRELIPASE (LIP-PROT-AMYL) 24000-76000 UNITS PO CPEP: ORAL_CAPSULE | ORAL | 0 refills | Status: DC

## 2021-02-19 NOTE — Patient Instructions (Addendum)
Your Nifedipine Ointment will be ordered at [facility] at [phone]  [address] [place], [place] [address].  Please take Pancrealipase, two with each meal and one with snack.

## 2021-02-20 NOTE — Progress Notes (Signed)
[name], MD [address]  [place]  [place], [place] [address]  Main: [phone]  Fax: [phone]   Primary Care Physician: Venia Carbon, MD   Chief Complaint  Patient presents with  . Rectal Bleeding    HPI: Molly Peters is a 60 y.o. female reports intermittent rectal bleeding, and rectal pain with bowel movements.  Ongoing for 2 to 3 weeks.  Mostly on toilet paper only.  No abdominal pain, nausea or vomiting.  Patient was on Zenpep, but states due to increased cost, she was not able to take it anymore.  It did help with her abdominal bloating and diarrhea.  Current Outpatient Medications  Medication Sig Dispense Refill  . albuterol (VENTOLIN HFA) 108 (90 Base) MCG/ACT inhaler INHALE 2 PUFFS BY MOUTH EVERY 6 HOURS ASNEEDED WHEEEZING/ SHORTNESS OF BREATH 8.5 g 1  . ALPRAZolam (XANAX) 1 MG tablet TAKE 1/2 TO 1 TABLET BY MOUTH 3 TIMES DAILY AS NEEDED ANXIETY 90 tablet 0  . fluticasone (FLONASE) 50 MCG/ACT nasal spray Place 2 sprays into both nostrils daily.    Marland Kitchen lamoTRIgine (LAMICTAL) 100 MG tablet Take 1 tablet (100 mg total) by mouth 2 (two) times daily. 180 tablet 3  . latanoprost (XALATAN) 0.005 % ophthalmic solution     . losartan-hydrochlorothiazide (HYZAAR) 100-12.5 MG tablet TAKE 1 TABLET BY MOUTH ONCE DAILY 90 tablet 3  . omeprazole (PRILOSEC) 40 MG capsule TAKE 1 CAPSULE BY MOUTH TWICE DAILY BEFORE MEALS 60 capsule 1  . ondansetron (ZOFRAN ODT) 4 MG disintegrating tablet Take 1 tablet (4 mg total) by mouth every 8 (eight) hours as needed for nausea or vomiting. 20 tablet 0  . oxyCODONE (OXY IR/ROXICODONE) 5 MG immediate release tablet Take 5 mg by mouth every 6 (six) hours as needed.    . Pancrelipase, Lip-Prot-Amyl, 24000-76000 units CPEP Take 2 capsules (48,000 Units total) by mouth with breakfast, with lunch, and with evening meal AND 1 capsule (24,000 Units total) with snacks. 810 capsule 0  . PARoxetine (PAXIL) 20 MG tablet TAKE 1 TABLET  BY MOUTH ONCE DAILY 90 tablet 2  . potassium chloride SA (KLOR-CON) 20 MEQ tablet Take 1 tablet (20 mEq total) by mouth daily. 3 tablet 0  . tiZANidine (ZANAFLEX) 2 MG tablet Take 1-2 tablets (2-4 mg total) by mouth every 6 (six) hours as needed for muscle spasms. 30 tablet 1  . Tiotropium Bromide Monohydrate (SPIRIVA RESPIMAT) 2.5 MCG/ACT AERS Inhale 2 puffs into the lungs daily for 1 day. 4 g 11   No current facility-administered medications for this visit.    Allergies as of 02/19/2021 - Review Complete 02/19/2021  Allergen Reaction Noted  . Carbamazepine Hives 08/26/2015  . Divalproex sodium Swelling and Other (See Comments) 01/01/2016  . Clonazepam Other (See Comments) 08/12/2005  . Diphenhydramine hcl Rash 08/26/2015  . Tiotropium bromide monohydrate Other (See Comments)   . Vicodin [hydrocodone-acetaminophen] Itching 11/15/2014    ROS:  General: Negative for anorexia, weight loss, fever, chills, fatigue, weakness. ENT: Negative for hoarseness, difficulty swallowing , nasal congestion. CV: Negative for chest pain, angina, palpitations, dyspnea on exertion, peripheral edema.  Respiratory: Negative for dyspnea at rest, dyspnea on exertion, cough, sputum, wheezing.  GI: See history of present illness. GU:  Negative for dysuria, hematuria, urinary incontinence, urinary frequency, nocturnal urination.  Endo: Negative for unusual weight change.    Physical Examination:   BP (!) 155/89   Pulse 80   Temp 98.2 F (36.8 C) (Oral)  Ht 5\' 1"  (1.549 m)   Wt 94 lb 12.8 oz (43 kg)   BMI 17.91 kg/m   General: Well-nourished, well-developed in no acute distress.  Eyes: No icterus. Conjunctivae pink. Mouth: Oropharyngeal mucosa moist and pink , no lesions erythema or exudate. Neck: Supple, Trachea midline Abdomen: Bowel sounds are normal, nontender, nondistended, no hepatosplenomegaly or masses, no abdominal bruits or hernia , no rebound or guarding.   Rectal exam: Small posterior  anal fissure seen.  No hemorrhoids present.  No stool in rectal vault.  No blood on DRE. Extremities: No lower extremity edema. No clubbing or deformities. Neuro: Alert and oriented x 3.  Grossly intact. Skin: Warm and dry, no jaundice.   Psych: Alert and cooperative, normal mood and affect.   Labs: CMP     Component Value Date/Time   NA 138 01/27/2021 1302   K 3.4 (L) 01/27/2021 1302   CL 101 01/27/2021 1302   CO2 25 01/27/2021 1302   GLUCOSE 107 (H) 01/27/2021 1302   BUN 13 01/27/2021 1302   CREATININE 0.81 01/27/2021 1302   CALCIUM 9.7 01/27/2021 1302   PROT 8.3 (H) 01/27/2021 1302   ALBUMIN 4.6 01/27/2021 1302   AST 26 01/27/2021 1302   ALT 12 01/27/2021 1302   ALKPHOS 72 01/27/2021 1302   BILITOT 0.5 01/27/2021 1302   GFRNONAA >60 01/27/2021 1302   GFRAA >60 08/12/2020 1313   Lab Results  Component Value Date   WBC 8.4 01/27/2021   HGB 15.8 (H) 01/27/2021   HCT 44.3 01/27/2021   MCV 93.7 01/27/2021   PLT 347 01/27/2021    Imaging Studies: No results found.  Assessment and Plan:   Molly Peters is a 60 y.o. y/o female here for evaluation of rectal bleeding  Symptoms from anal fissure Nifedipine ointment sent to specialty pharmacy  High-fiber diet discussed.  Patient reporting soft stools.  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    Stop PPI for 2 weeks and obtain fasting gastrin level.  She has had several imaging studies for other reasons including CT abdomen pelvis with contrast, PET scan that have not shown any malignant lesions  When I tried prescribing pancreatic replacement enzymes, it appears pancrelipase is under formulary for her insurance.  We will try prescribing this and see if it allows for better coverage for the patient.   Dr [name]

## 2021-02-27 ENCOUNTER — Ambulatory Visit
Admission: RE | Admit: 2021-02-27 | Discharge: 2021-02-27 | Disposition: A | Discharge status: Home / Self Care | Payer: Medicare HMO | Source: Ambulatory Visit | Attending: Nurse Practitioner | Admitting: Nurse Practitioner

## 2021-02-27 ENCOUNTER — Ambulatory Visit: Payer: Medicare HMO

## 2021-02-27 ENCOUNTER — Other Ambulatory Visit: Payer: Self-pay

## 2021-02-27 ENCOUNTER — Inpatient Hospital Stay: Payer: Medicare HMO | Attending: Hematology and Oncology

## 2021-02-27 DIAGNOSIS — E538 Deficiency of other specified B group vitamins: Secondary | ICD-10-CM | POA: Insufficient documentation

## 2021-02-27 DIAGNOSIS — Z122 Encounter for screening for malignant neoplasm of respiratory organs: Secondary | ICD-10-CM

## 2021-02-27 DIAGNOSIS — D509 Iron deficiency anemia, unspecified: Secondary | ICD-10-CM | POA: Insufficient documentation

## 2021-02-27 DIAGNOSIS — F17200 Nicotine dependence, unspecified, uncomplicated: Secondary | ICD-10-CM

## 2021-02-27 DIAGNOSIS — Z87891 Personal history of nicotine dependence: Secondary | ICD-10-CM | POA: Diagnosis not present

## 2021-02-27 DIAGNOSIS — D500 Iron deficiency anemia secondary to blood loss (chronic): Secondary | ICD-10-CM

## 2021-02-27 MED ORDER — CYANOCOBALAMIN 1000 MCG/ML IJ SOLN
1000.0000 ug | Freq: Once | INTRAMUSCULAR | Status: AC
Start: 2021-02-27 — End: 2021-02-27
  Administered 2021-02-27: 1000 ug via INTRAMUSCULAR
  Filled 2021-02-27: qty 1

## 2021-02-28 ENCOUNTER — Other Ambulatory Visit: Payer: Self-pay | Admitting: Internal Medicine

## 2021-02-28 NOTE — Telephone Encounter (Signed)
Last filled 01-09-21 #90 Last OV Acute 11-26-20 Next OV 06-30-21 [facility]

## 2021-03-06 ENCOUNTER — Emergency Department: Payer: Medicare HMO

## 2021-03-06 ENCOUNTER — Encounter: Payer: Self-pay | Admitting: Emergency Medicine

## 2021-03-06 ENCOUNTER — Emergency Department
Admission: EM | Admit: 2021-03-06 | Discharge: 2021-03-07 | Disposition: A | Discharge status: Home / Self Care | Payer: Medicare HMO | Attending: Emergency Medicine | Admitting: Emergency Medicine

## 2021-03-06 ENCOUNTER — Other Ambulatory Visit: Payer: Self-pay

## 2021-03-06 ENCOUNTER — Other Ambulatory Visit: Payer: Self-pay | Admitting: Internal Medicine

## 2021-03-06 DIAGNOSIS — R1032 Left lower quadrant pain: Secondary | ICD-10-CM | POA: Diagnosis not present

## 2021-03-06 DIAGNOSIS — K5750 Diverticulosis of both small and large intestine without perforation or abscess without bleeding: Secondary | ICD-10-CM | POA: Diagnosis not present

## 2021-03-06 DIAGNOSIS — R0602 Shortness of breath: Secondary | ICD-10-CM | POA: Diagnosis not present

## 2021-03-06 DIAGNOSIS — Z96612 Presence of left artificial shoulder joint: Secondary | ICD-10-CM | POA: Diagnosis not present

## 2021-03-06 DIAGNOSIS — I10 Essential (primary) hypertension: Secondary | ICD-10-CM | POA: Diagnosis not present

## 2021-03-06 DIAGNOSIS — K219 Gastro-esophageal reflux disease without esophagitis: Secondary | ICD-10-CM | POA: Insufficient documentation

## 2021-03-06 DIAGNOSIS — I700 Atherosclerosis of aorta: Secondary | ICD-10-CM | POA: Diagnosis not present

## 2021-03-06 DIAGNOSIS — K625 Hemorrhage of anus and rectum: Secondary | ICD-10-CM | POA: Insufficient documentation

## 2021-03-06 DIAGNOSIS — R079 Chest pain, unspecified: Secondary | ICD-10-CM | POA: Diagnosis not present

## 2021-03-06 DIAGNOSIS — F17210 Nicotine dependence, cigarettes, uncomplicated: Secondary | ICD-10-CM | POA: Diagnosis not present

## 2021-03-06 DIAGNOSIS — Z79899 Other long term (current) drug therapy: Secondary | ICD-10-CM | POA: Insufficient documentation

## 2021-03-06 DIAGNOSIS — N131 Hydronephrosis with ureteral stricture, not elsewhere classified: Secondary | ICD-10-CM | POA: Diagnosis not present

## 2021-03-06 DIAGNOSIS — Z7951 Long term (current) use of inhaled steroids: Secondary | ICD-10-CM | POA: Diagnosis not present

## 2021-03-06 DIAGNOSIS — Z96611 Presence of right artificial shoulder joint: Secondary | ICD-10-CM | POA: Diagnosis not present

## 2021-03-06 DIAGNOSIS — Z8541 Personal history of malignant neoplasm of cervix uteri: Secondary | ICD-10-CM | POA: Diagnosis not present

## 2021-03-06 DIAGNOSIS — J449 Chronic obstructive pulmonary disease, unspecified: Secondary | ICD-10-CM | POA: Diagnosis not present

## 2021-03-06 DIAGNOSIS — R102 Pelvic and perineal pain: Secondary | ICD-10-CM | POA: Diagnosis not present

## 2021-03-06 DIAGNOSIS — J439 Emphysema, unspecified: Secondary | ICD-10-CM | POA: Diagnosis not present

## 2021-03-06 LAB — CBC
HCT: 41.7 % (ref 36.0–46.0)
Hemoglobin: 14.4 g/dL (ref 12.0–15.0)
MCH: 33.3 pg (ref 26.0–34.0)
MCHC: 34.5 g/dL (ref 30.0–36.0)
MCV: 96.3 fL (ref 80.0–100.0)
Platelets: 307 10*3/uL (ref 150–400)
RBC: 4.33 MIL/uL (ref 3.87–5.11)
RDW: 12.2 % (ref 11.5–15.5)
WBC: 9.2 10*3/uL (ref 4.0–10.5)
nRBC: 0 % (ref 0.0–0.2)

## 2021-03-06 LAB — TYPE AND SCREEN
ABO/RH(D): B POS
Antibody Screen: NEGATIVE

## 2021-03-06 LAB — BASIC METABOLIC PANEL
Anion gap: 9 (ref 5–15)
BUN: 10 mg/dL (ref 8–23)
CO2: 28 mmol/L (ref 22–32)
Calcium: 9.2 mg/dL (ref 8.9–10.3)
Chloride: 101 mmol/L (ref 98–111)
Creatinine, Ser: 0.85 mg/dL (ref 0.44–1.00)
GFR, Estimated: >60 mL/min (ref >60)
Glucose, Bld: 99 mg/dL (ref 70–99)
Potassium: 3 mmol/L — ABNORMAL LOW (ref 3.5–5.1)
Sodium: 138 mmol/L (ref 135–145)

## 2021-03-06 LAB — PROCALCITONIN: Procalcitonin: <0.1 ng/mL

## 2021-03-06 LAB — TROPONIN I (HIGH SENSITIVITY): Troponin I (High Sensitivity): 7 ng/L (ref <18)

## 2021-03-06 LAB — LIPASE, BLOOD: Lipase: 79 U/L — ABNORMAL HIGH (ref 11–51)

## 2021-03-06 LAB — MAGNESIUM: Magnesium: 1.9 mg/dL (ref 1.7–2.4)

## 2021-03-06 MED ORDER — IOHEXOL 300 MG/ML  SOLN
75.0000 mL | Freq: Once | INTRAMUSCULAR | Status: AC | PRN
  Administered 2021-03-06: 75 mL via INTRAVENOUS

## 2021-03-06 MED ORDER — IOHEXOL 9 MG/ML PO SOLN
500.0000 mL | ORAL | Status: AC
  Administered 2021-03-06 (×2): 500 mL via ORAL

## 2021-03-06 MED ORDER — SODIUM CHLORIDE 0.9 % IV BOLUS
1000.0000 mL | Freq: Once | INTRAVENOUS | Status: AC
  Administered 2021-03-06: 1000 mL via INTRAVENOUS

## 2021-03-06 MED ORDER — DOCUSATE SODIUM 100 MG PO CAPS: 100.0000 mg | ORAL_CAPSULE | Freq: Two times a day (BID) | ORAL | 0 refills | Status: AC

## 2021-03-06 NOTE — ED Notes (Signed)
Patient ambulated to room commode with a steady gait. No blood noted in toilet or on tissue. Patient started on 2nd bottle of oral contrast at 2230.

## 2021-03-06 NOTE — ED Notes (Signed)
Report given to Emily Rn

## 2021-03-06 NOTE — Telephone Encounter (Signed)
Last filled 02-13-21 #30 Last OV 11-26-20 Next OV 06-30-21 [facility]

## 2021-03-06 NOTE — ED Notes (Signed)
Patient is still tolerating oral contrast well. No blood noted from rectum at this time.

## 2021-03-06 NOTE — ED Triage Notes (Signed)
Pt via POV from home. Pt presents to the ED with multiple complaints. Pt c/o LLQ abd pain, pelvic pain, rectal bleeding, SOB, and centralized CP. CP and SOB been going on for a couple of weeks. Rectal bleeding started Sun. Pt noticed bright red blood in her stool. Denies blood thinners. Pt is A&Ox4 and NAD.

## 2021-03-06 NOTE — ED Provider Notes (Addendum)
[facility] Emergency Department Provider Note  Time seen: 9:29 PM  I have reviewed the triage vital signs and the nursing notes.   HISTORY  Chief Complaint Chest Pain, Shortness of Breath, and Rectal Bleeding   HPI Molly Peters is a 60 y.o. female with a past medical history of anxiety, depression, fibromyalgia, hypertension, internal hemorrhoids, presents to the emergency department for left lower quadrant abdominal pain, rectal bleeding intermittent chest pain.  According to the patient for the past few months she has been experiencing intermittent chest pain but denies any worsening recently.  States she has been experiencing vague abdominal pains including left lower quadrant pain, mild to moderate in severity and intermittent.  She also has noticed over the past 5 days rectal bleeding when she has bowel movements.  Patient states a history of rectal bleeding previously and follows up with Dr. Bonna Gains of GI medicine.  Patient denies any nausea vomiting.   Past Medical History:  Diagnosis Date  . Anxiety   . Cervical cancer ([place]) 1980's  . Chronic back pain 11/12/2009   Qualifier: Diagnosis of  By: Maxie Better FNP, Rosalita Levan   . COPD (chronic obstructive pulmonary disease) (HCC)    states SOB with ADLs; no O2 use; able to speak in complete sentences without SOB(11/15/2014)  . Depression   . Difficulty swallowing pills    s/p cervical fusion  . Family history of adverse reaction to anesthesia    pt's mother and sister have hx. of post-op N/V  . Fibromyalgia   . GERD (gastroesophageal reflux disease)   . Heart murmur   . History of gastric ulcer   . Hypertension    states under control with med., has been on med. x 1-2 yr.  . IBS (irritable bowel syndrome)   . Internal hemorrhoid    states has had intermittent bright red bleeding with BM (11/15/2014)  . Intraductal papilloma of breast, right 08/08/2018  . Limited joint range of motion    neck - s/p  cervical fusion  . Localized primary osteoarthritis of right shoulder region 11/23/2014  . Osteoarthritis of left shoulder 07/05/2015  . Osteoarthritis of right shoulder 11/2014  . Pneumonia   . Seizures ([place])    last seizure 2010  . Short-term memory loss   . TMJ syndrome   . Valvular heart disease    Initial workup a number of years ago at [facility].  Echo (10/2009) showed EF 60-65%,      normal LV size, moderate aortic insufficiency with a trileaflet aortic valve and normal aortic root size.  Mild      mitral regurgitation.   . Wears dentures    lower    Patient Active Problem List   Diagnosis Date Noted  . Malnutrition of moderate degree ([place]) 06/25/2020  . Hypokalemia 05/02/2020  . History of gastric ulcer   . Chronic nausea 02/20/2020  . Goals of care, counseling/discussion 02/20/2020  . Fatigue 01/30/2020  . Personal history of colonic polyps   . Diverticulosis of large intestine without diverticulitis   . Pulmonary nodules 08/08/2018  . B12 deficiency 07/21/2018  . Weight loss 07/21/2018  . Iron deficiency anemia due to chronic blood loss 07/05/2018  . Polyp of colon   . Acute peptic ulcer of stomach   . Acute posthemorrhagic anemia   . Advance directive discussed with patient 10/27/2017  . Neuropathy 08/11/2016  . Vitamin D deficiency 03/15/2016  . 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 Hx of cervical spine surgery (Left ACDF) 02/02/2016  . Chronic pain 01/29/2016  . Marijuana use 01/29/2016  . Osteoarthritis of shoulder (Left) 07/05/2015  . S/P Bilateral Total Shoulder Replacement (2016) 11/23/2014  . Osteoarthritis of shoulder (Right) 04/13/2014  . Routine general medical examination at a health care facility 04/05/2013  . CAROTID BRUIT, LEFT 01/02/2011  . Restless legs syndrome (RLS) 10/21/2010  . Essential hypertension, benign 06/23/2010  . HYPERLIPIDEMIA-MIXED 10/23/2009  . Valvular heart disease 03/27/2009  . IRRITABLE  BOWEL SYNDROME 11/13/2008  . Mood disorder ([place]) 09/07/2007  . Tobacco use disorder 09/07/2007  . GLAUCOMA 09/07/2007  . HEMORRHOIDS 09/07/2007  . Esophagogastric ulcer 09/07/2007  . COPD (chronic obstructive pulmonary disease) with chronic bronchitis ([place]) 08/08/2007  . GERD 08/08/2007  . Seizure disorder ([place]) 08/08/2007    Past Surgical History:  Procedure Laterality Date  . ABDOMINAL HYSTERECTOMY     partial  . ANTERIOR CERVICAL DECOMP/DISCECTOMY FUSION  02/06/2011   C4-5, C5-6, C6-7  . BREAST BIOPSY Bilateral 10+ yrs ago   neg  . BREAST BIOPSY Right 08/03/2018   2 areas bx, -neg  . BREAST LUMPECTOMY Bilateral 1989   x 3 - benign  . BREAST LUMPECTOMY Right 08/24/2018   Procedure: BREAST LUMPECTOMY WITH ULTRASOUND IN OR;  Surgeon: Vickie Epley, MD;  Location: [facility] ORS;  Service: General;  Laterality: Right;  . CARDIAC CATHETERIZATION  1995  . CESAREAN SECTION     x 2  . COLONOSCOPY  09/28/2008  . COLONOSCOPY N/A 06/15/2018   Procedure: COLONOSCOPY;  Surgeon: Virgel Manifold, MD;  Location: [facility] ENDOSCOPY;  Service: Endoscopy;  Laterality: N/A;  . COLONOSCOPY WITH PROPOFOL N/A 05/04/2019   Procedure: COLONOSCOPY WITH PROPOFOL;  Surgeon: Virgel Manifold, MD;  Location: [facility] ENDOSCOPY;  Service: Endoscopy;  Laterality: N/A;  . ESOPHAGOGASTRODUODENOSCOPY  09/28/2008  . ESOPHAGOGASTRODUODENOSCOPY N/A 06/14/2018   Procedure: ESOPHAGOGASTRODUODENOSCOPY (EGD);  Surgeon: Virgel Manifold, MD;  Location[facility] ENDOSCOPY;  Service: Endoscopy;  Laterality: N/A;  . ESOPHAGOGASTRODUODENOSCOPY (EGD) WITH PROPOFOL N/A 05/04/2019   Procedure: ESOPHAGOGASTRODUODENOSCOPY (EGD) WITH PROPOFOL;  Surgeon: Virgel Manifold, MD;  Location: [facility] ENDOSCOPY;  Service: Endoscopy;  Laterality: N/A;  . ESOPHAGOGASTRODUODENOSCOPY (EGD) WITH PROPOFOL N/A 03/18/2020   Procedure: ESOPHAGOGASTRODUODENOSCOPY (EGD) WITH PROPOFOL;  Surgeon: Lin Landsman, MD;  Location: [facility] ENDOSCOPY;   Service: Gastroenterology;  Laterality: N/A;  . HARDWARE REMOVAL  11/17/2006   L5-S1  . LAMINECTOMY WITH POSTERIOR LATERAL ARTHRODESIS LEVEL 3  12/06/2009   with synovial cyst resection L3-4 bilat.  Marland Kitchen LAMINECTOMY WITH POSTERIOR LATERAL ARTHRODESIS LEVEL 3  11/17/2006   L4-5  . NM MYOVIEW LTD     Lexiscan myoview (10/2009): EF 77%, normal wall motion, normal perfusion.   Marland Kitchen SHOULDER ARTHROSCOPY WITH DEBRIDEMENT AND BICEP TENDON REPAIR Right 04/13/2014   Procedure: RIGHT SHOULDER ARTHROSCOPY WITH DEBRIDEMENT EXTENSIVE;  Surgeon: Johnny Bridge, MD;  Location: [facility];  Service: Orthopedics;  Laterality: Right;  . TOTAL SHOULDER ARTHROPLASTY Right 11/23/2014   Procedure: TOTAL RIGHT SHOULDER ARTHROPLASTY;  Surgeon: Johnny Bridge, MD;  Location: [place];  Service: Orthopedics;  Laterality: Right;  . TOTAL SHOULDER ARTHROPLASTY Left 07/05/2015   Procedure: LEFT TOTAL SHOULDER REPLACEMENT;  Surgeon: Marchia Bond, MD;  Location: [place];  Service: Orthopedics;  Laterality: Left;    Prior to Admission medications   Medication Sig Start Date End Date Taking? Authorizing Provider  albuterol (VENTOLIN HFA) 108 (90 Base) MCG/ACT inhaler INHALE 2  PUFFS BY MOUTH EVERY 6 HOURS ASNEEDED WHEEEZING/ SHORTNESS OF BREATH 03/06/21   Venia Carbon, MD  ALPRAZolam Duanne Moron) 1 MG tablet TAKE 1/2 TO 1 TABLET BY MOUTH 3 TIMES DAILY AS NEEDED ANXIETY 02/28/21   Venia Carbon, MD  fluticasone (FLONASE) 50 MCG/ACT nasal spray Place 2 sprays into both nostrils daily.    [provider]  lamoTRIgine (LAMICTAL) 100 MG tablet Take 1 tablet (100 mg total) by mouth 2 (two) times daily. 06/25/20   Venia Carbon, MD  latanoprost (XALATAN) 0.005 % ophthalmic solution  12/18/19   [provider]  losartan-hydrochlorothiazide (HYZAAR) 100-12.5 MG tablet TAKE 1 TABLET BY MOUTH ONCE DAILY 01/03/21   Viviana Simpler I, MD  omeprazole (PRILOSEC) 40 MG capsule TAKE 1  CAPSULE BY MOUTH TWICE DAILY BEFORE MEALS 05/01/20   Virgel Manifold, MD  ondansetron (ZOFRAN ODT) 4 MG disintegrating tablet Take 1 tablet (4 mg total) by mouth every 8 (eight) hours as needed for nausea or vomiting. 12/18/20   Lequita Asal, MD  oxyCODONE (OXY IR/ROXICODONE) 5 MG immediate release tablet Take 5 mg by mouth every 6 (six) hours as needed. 06/08/20   [provider]  Pancrelipase, Lip-Prot-Amyl, 24000-76000 units CPEP Take 2 capsules (48,000 Units total) by mouth with breakfast, with lunch, and with evening meal AND 1 capsule (24,000 Units total) with snacks. 02/19/21 05/20/21  Virgel Manifold, MD  PARoxetine (PAXIL) 20 MG tablet TAKE 1 TABLET BY MOUTH ONCE DAILY 10/09/20   Viviana Simpler I, MD  potassium chloride SA (KLOR-CON) 20 MEQ tablet Take 1 tablet (20 mEq total) by mouth daily. 08/12/20   Lequita Asal, MD  Tiotropium Bromide Monohydrate (SPIRIVA RESPIMAT) 2.5 MCG/ACT AERS Inhale 2 puffs into the lungs daily for 1 day. 01/08/21 01/09/21  Venia Carbon, MD  tiZANidine (ZANAFLEX) 2 MG tablet TAKE 1-2 TABLETS BY MOUTH EVERY 6 HOURS AS NEEDED FOR MUSCLE SPASMS 03/06/21   Venia Carbon, MD    Allergies  Allergen Reactions  . Carbamazepine Hives  . Divalproex Sodium Swelling and Other (See Comments)    HAIR FALLS OUT  . Clonazepam Other (See Comments)    HALLUCINATIONS  . Diphenhydramine Hcl Rash  . Tiotropium Bromide Monohydrate Other (See Comments)    CREATES YEAST IN THROAT  . Vicodin [Hydrocodone-Acetaminophen] Itching    Family History  Problem Relation Age of Onset  . Cervical cancer Mother   . Anesthesia problems Mother        post-op N/V  . Rheum arthritis Mother   . Anxiety disorder Mother   . Cancer Father        colon cancer  . Migraines Sister   . Cervical cancer Sister   . Anesthesia problems Sister        post-op N/V  . Migraines Brother   . Asthma Daughter   . Cerebral palsy Daughter        age 76  . Coronary artery  disease Maternal Grandmother   . Hypertension Maternal Grandmother   . Diabetes Maternal Grandmother   . Coronary artery disease Paternal Grandmother   . Hypertension Paternal Grandmother   . Diabetes Paternal Grandmother   . Breast cancer Maternal Aunt   . Breast cancer Maternal Aunt   . Breast cancer Maternal Aunt   . Colon cancer Paternal Uncle   . Lung cancer Paternal Uncle     Social History Social History   Tobacco Use  . Smoking status: Current Every Day Smoker  Packs/day: 1.00    Years: 42.75    Pack years: 42.75    Types: Cigarettes  . Smokeless tobacco: Never Used  Vaping Use  . Vaping Use: Never used  Substance Use Topics  . Alcohol use: Yes    Alcohol/week: 0.0 standard drinks    Comment: rare  . Drug use: Yes    Types: Marijuana    Comment: per pt she smoke thc at bedtime and prn  ( for pain relief and to help sleep since not given percocet anymore )-per pt     Review of Systems Constitutional: Negative for fever. Cardiovascular: Negative for chest pain. Respiratory: Negative for shortness of breath. Gastrointestinal: Left lower quadrant abdominal pain.  Negative for nausea vomiting.  Positive for rectal bleeding. Musculoskeletal: Negative for musculoskeletal complaints Neurological: Negative for headache All other ROS negative  ____________________________________________   PHYSICAL EXAM:  VITAL SIGNS: ED Triage Vitals  Enc Vitals Group     BP 03/06/21 1838 (!) 150/98     Pulse Rate 03/06/21 1838 69     Resp 03/06/21 1838 18     Temp 03/06/21 1838 98.8 F (37.1 C)     Temp Source 03/06/21 1838 Oral     SpO2 03/06/21 1838 98 %     Weight 03/06/21 1838 94 lb (42.6 kg)     Height 03/06/21 1838 5' (1.524 m)     Head Circumference --      Peak Flow --      Pain Score 03/06/21 1845 5     Pain Loc --      Pain Edu? --      Excl. in [place]? --    Constitutional: Alert and oriented. Well appearing and in no distress. Eyes: Normal exam ENT       Head: Normocephalic and atraumatic.      Mouth/Throat: Mucous membranes are moist. Cardiovascular: Normal rate, regular rhythm.  Respiratory: Normal respiratory effort without tachypnea nor retractions. Breath sounds are clear  Gastrointestinal: Soft and nontender. No distention. Musculoskeletal: Nontender with normal range of motion in all extremities.  Neurologic:  Normal speech and language. No gross focal neurologic deficits  Skin:  Skin is warm, dry and intact.  Psychiatric: Mood and affect are normal.   ____________________________________________   RADIOLOGY  Chest x-ray negative. CT scan is negative for acute abnormality. ____________________________________________   INITIAL IMPRESSION / ASSESSMENT AND PLAN / ED COURSE  Pertinent labs & imaging results that were available during my care of the patient were reviewed by me and considered in my medical decision making (see chart for details).   Patient presents emergency department for left lower quadrant abdominal pain and rectal bleeding intermittent chest pains.  Overall the patient appears well, does have mild to moderate left lower quadrant tenderness to palpation without rebound guarding or distention.  Rectal examination shows brown stool with pink-tinged mucus guaiac positive.  Reassuringly patient's lab work shows a normal H&H.  Lipase is mildly elevated.  Patient denies alcohol use.  Given 5 days of symptoms with a normal H&H I suspect patient will likely be able to follow-up with GI medicine as an outpatient if her CT scan does not show any concerning findings, we will place the patient on stool softeners.  Troponin is negative.  Lab work is reassuring besides mild lipase elevation.  CT scans pending.  Patient care signed out to oncoming provider.  CT scan is negative for acute abnormality.  Lipase is mildly elevated.  We will place the  patient on Colace and have the patient follow-up with her GI doctor.  Patient  agreeable to plan of care.  Discussed return precautions.  Molly Peters was evaluated in Emergency Department on 03/06/2021 for the symptoms described in the history of present illness. She was evaluated in the context of the global COVID-19 pandemic, which necessitated consideration that the patient might be at risk for infection with the SARS-CoV-2 virus that causes COVID-19. Institutional protocols and algorithms that pertain to the evaluation of patients at risk for COVID-19 are in a state of rapid change based on information released by regulatory bodies including the CDC and federal and state organizations. These policies and algorithms were followed during the patient's care in the ED.  ____________________________________________   FINAL CLINICAL IMPRESSION(S) / ED DIAGNOSES  Left lower quadrant abdominal pain. Rectal bleeding.   Harvest Dark, MD 03/06/21 0071    Harvest Dark, MD 03/07/21 [phone]

## 2021-03-10 ENCOUNTER — Telehealth: Payer: Self-pay

## 2021-03-10 NOTE — Telephone Encounter (Signed)
Called pt to see how she was doing after recent ER Visit for rectal bleeding and to make sure she has been set up with GI. She said she is to see them near the end of the month. Said she is doing better.

## 2021-03-13 ENCOUNTER — Encounter: Payer: Self-pay | Admitting: *Deleted

## 2021-03-26 ENCOUNTER — Telehealth: Payer: Self-pay | Admitting: Oncology

## 2021-03-26 NOTE — Telephone Encounter (Signed)
Left VM with patient to notify her that her appt on 5/23 has been rescheduled to 5/24 due to the office being closed.

## 2021-03-28 ENCOUNTER — Other Ambulatory Visit: Payer: Self-pay | Admitting: Internal Medicine

## 2021-03-28 DIAGNOSIS — K219 Gastro-esophageal reflux disease without esophagitis: Secondary | ICD-10-CM

## 2021-03-28 MED ORDER — ONDANSETRON 4 MG PO TBDP: 4.0000 mg | ORAL_TABLET | Freq: Three times a day (TID) | ORAL | 1 refills | Status: DC | PRN

## 2021-03-28 NOTE — Addendum Note (Signed)
Addended by: Pilar Grammes on: 03/28/2021 11:22 AM   Modules accepted: Orders

## 2021-03-28 NOTE — Addendum Note (Signed)
Addended by: Viviana Simpler I on: 03/28/2021 01:40 PM   Modules accepted: Orders

## 2021-03-28 NOTE — Telephone Encounter (Signed)
  LAST APPOINTMENT DATE: 03/10/2021   NEXT APPOINTMENT DATE:@8 /22/2022  MEDICATION: zofran  PHARMACY: [facility]  Let patient know to contact pharmacy at the end of the day to make sure medication is ready.  Please notify patient to allow 48-72 hours to process  Encourage patient to contact the pharmacy for refills or they can request refills through [place]:   LAST REFILL:  QTY:  REFILL DATE:    OTHER COMMENTS:    Okay for refill?  Please advise

## 2021-03-31 ENCOUNTER — Ambulatory Visit: Payer: Medicare HMO

## 2021-04-01 ENCOUNTER — Other Ambulatory Visit: Payer: Self-pay

## 2021-04-01 ENCOUNTER — Inpatient Hospital Stay: Payer: Medicare HMO | Attending: Oncology

## 2021-04-01 VITALS — Wt 95.2 lb

## 2021-04-01 DIAGNOSIS — D509 Iron deficiency anemia, unspecified: Secondary | ICD-10-CM | POA: Insufficient documentation

## 2021-04-01 DIAGNOSIS — E538 Deficiency of other specified B group vitamins: Secondary | ICD-10-CM | POA: Diagnosis not present

## 2021-04-01 DIAGNOSIS — D500 Iron deficiency anemia secondary to blood loss (chronic): Secondary | ICD-10-CM

## 2021-04-01 MED ORDER — CYANOCOBALAMIN 1000 MCG/ML IJ SOLN
1000.0000 ug | Freq: Once | INTRAMUSCULAR | Status: AC
  Administered 2021-04-01: 1000 ug via INTRAMUSCULAR

## 2021-04-03 ENCOUNTER — Ambulatory Visit: Payer: Medicare HMO | Admitting: Gastroenterology

## 2021-04-03 ENCOUNTER — Other Ambulatory Visit: Payer: Self-pay

## 2021-04-03 ENCOUNTER — Encounter: Payer: Self-pay | Admitting: Gastroenterology

## 2021-04-03 VITALS — BP 134/86 | HR 73 | Ht 60.0 in | Wt 94.6 lb

## 2021-04-03 DIAGNOSIS — R1084 Generalized abdominal pain: Secondary | ICD-10-CM | POA: Diagnosis not present

## 2021-04-03 DIAGNOSIS — Z86010 Personal history of colonic polyps: Secondary | ICD-10-CM | POA: Diagnosis not present

## 2021-04-03 DIAGNOSIS — Z8711 Personal history of peptic ulcer disease: Secondary | ICD-10-CM | POA: Diagnosis not present

## 2021-04-03 MED ORDER — NA SULFATE-K SULFATE-MG SULF 17.5-3.13-1.6 GM/177ML PO SOLN: 1.0000 | Freq: Once | ORAL | 0 refills | Status: AC

## 2021-04-03 NOTE — Progress Notes (Signed)
Molly Antigua, MD [address]  [place], [place] [address]  Main: [phone]  Fax: [phone]   Primary Care Physician: Venia Carbon, MD   Chief complaint: Abdominal pain  HPI: Molly Peters is a 60 y.o. female with history of gastric ulcers, exocrine pancreatic insufficiency, here for follow-up.  Patient was able to obtain Creon after last visit and reports that her abdominal bloating and diarrhea have resolved.  Does have some epigastric pain.  Denies any dysphagia, nausea or vomiting.  She has had history of gastric ulcers on previous endoscopies.  Gastrin level was ordered but patient never got it done.  Her previous colonoscopy showed fair prep and repeat was recommended in 2 years with 2-day prep which would be this year.  Current Outpatient Medications  Medication Sig Dispense Refill  . albuterol (VENTOLIN HFA) 108 (90 Base) MCG/ACT inhaler INHALE 2 PUFFS BY MOUTH EVERY 6 HOURS ASNEEDED WHEEEZING/ SHORTNESS OF BREATH 8.5 g 1  . ALPRAZolam (XANAX) 1 MG tablet TAKE 1/2 TO 1 TABLET BY MOUTH 3 TIMES DAILY AS NEEDED ANXIETY 90 tablet 0  . docusate sodium (COLACE) 100 MG capsule Take 1 capsule (100 mg total) by mouth 2 (two) times daily. 60 capsule 0  . fluticasone (FLONASE) 50 MCG/ACT nasal spray Place 2 sprays into both nostrils daily.    Marland Kitchen lamoTRIgine (LAMICTAL) 100 MG tablet Take 1 tablet (100 mg total) by mouth 2 (two) times daily. 180 tablet 3  . latanoprost (XALATAN) 0.005 % ophthalmic solution     . losartan-hydrochlorothiazide (HYZAAR) 100-12.5 MG tablet TAKE 1 TABLET BY MOUTH ONCE DAILY 90 tablet 3  . Na Sulfate-K Sulfate-Mg Sulf 17.5-3.13-1.6 GM/177ML SOLN Take 1 kit by mouth once for 1 dose. 354 mL 0  . omeprazole (PRILOSEC) 40 MG capsule TAKE 1 CAPSULE BY MOUTH TWICE DAILY BEFORE MEALS 60 capsule 1  . ondansetron (ZOFRAN ODT) 4 MG disintegrating tablet Take 1 tablet (4 mg total) by mouth every 8 (eight) hours as needed for nausea or  vomiting. 30 tablet 1  . Pancrelipase, Lip-Prot-Amyl, 24000-76000 units CPEP Take 2 capsules (48,000 Units total) by mouth with breakfast, with lunch, and with evening meal AND 1 capsule (24,000 Units total) with snacks. 810 capsule 0  . PARoxetine (PAXIL) 20 MG tablet TAKE 1 TABLET BY MOUTH ONCE DAILY 90 tablet 2  . potassium chloride SA (KLOR-CON) 20 MEQ tablet Take 1 tablet (20 mEq total) by mouth daily. 3 tablet 0  . tiZANidine (ZANAFLEX) 2 MG tablet TAKE 1-2 TABLETS BY MOUTH EVERY 6 HOURS AS NEEDED FOR MUSCLE SPASMS 30 tablet 1  . Tiotropium Bromide Monohydrate (SPIRIVA RESPIMAT) 2.5 MCG/ACT AERS Inhale 2 puffs into the lungs daily for 1 day. 4 g 11   No current facility-administered medications for this visit.    Allergies as of 04/03/2021 - Review Complete 04/03/2021  Allergen Reaction Noted  . Carbamazepine Hives 08/26/2015  . Divalproex sodium Swelling and Other (See Comments) 01/01/2016  . Clonazepam Other (See Comments) 08/12/2005  . Diphenhydramine hcl Rash 08/26/2015  . Tiotropium bromide monohydrate Other (See Comments)   . Vicodin [hydrocodone-acetaminophen] Itching 11/15/2014    ROS:  General: Negative for anorexia, weight loss, fever, chills, fatigue, weakness. ENT: Negative for hoarseness, difficulty swallowing , nasal congestion. CV: Negative for chest pain, angina, palpitations, dyspnea on exertion, peripheral edema.  Respiratory: Negative for dyspnea at rest, dyspnea on exertion, cough, sputum, wheezing.  GI: See history of present illness. GU:  Negative for dysuria,  hematuria, urinary incontinence, urinary frequency, nocturnal urination.  Endo: Negative for unusual weight change.    Physical Examination:   BP 134/86   Pulse 73   Ht 5' (1.524 m)   Wt 94 lb 9.6 oz (42.9 kg)   BMI 18.48 kg/m   General: Well-nourished, well-developed in no acute distress.  Eyes: No icterus. Conjunctivae pink. Mouth: Oropharyngeal mucosa moist and pink , no lesions erythema  or exudate. Neck: Supple, Trachea midline Abdomen: Bowel sounds are normal, nontender, nondistended, no hepatosplenomegaly or masses, no abdominal bruits or hernia , no rebound or guarding.   Extremities: No lower extremity edema. No clubbing or deformities. Neuro: Alert and oriented x 3.  Grossly intact. Skin: Warm and dry, no jaundice.   Psych: Alert and cooperative, normal mood and affect.   Labs: CMP     Component Value Date/Time   NA 138 03/06/2021 1849   K 3.0 (L) 03/06/2021 1849   CL 101 03/06/2021 1849   CO2 28 03/06/2021 1849   GLUCOSE 99 03/06/2021 1849   BUN 10 03/06/2021 1849   CREATININE 0.85 03/06/2021 1849   CALCIUM 9.2 03/06/2021 1849   PROT 8.3 (H) 01/27/2021 1302   ALBUMIN 4.6 01/27/2021 1302   AST 26 01/27/2021 1302   ALT 12 01/27/2021 1302   ALKPHOS 72 01/27/2021 1302   BILITOT 0.5 01/27/2021 1302   GFRNONAA >60 03/06/2021 1849   GFRAA >60 08/12/2020 1313   Lab Results  Component Value Date   WBC 9.2 03/06/2021   HGB 14.4 03/06/2021   HCT 41.7 03/06/2021   MCV 96.3 03/06/2021   PLT 307 03/06/2021    Imaging Studies: DG Chest 2 View  Result Date: 03/06/2021 CLINICAL DATA:  Chest pain shortness of breath EXAM: CHEST - 2 VIEW COMPARISON:  CT February 27, 2021 and radiograph November 24, 2020. FINDINGS: The heart size and mediastinal contours are within normal limits. Aortic atherosclerosis. Emphysematous change with hyperinflation of the lungs. No focal consolidation. No pleural effusion. No pneumothorax. Bilateral shoulder arthroplasties, partially visualized lumbar spinal fusion hardware. Partially visualized anterior cervical fusion hardware. S-shaped scoliotic curvature of the thoracolumbar spine. IMPRESSION: No active cardiopulmonary disease. Electronically Signed   By: Dahlia Bailiff MD   On: 03/06/2021 19:21   CT ABDOMEN PELVIS W CONTRAST  Result Date: 03/06/2021 CLINICAL DATA:  Left lower quadrant pain, pelvic pain, rectal bleeding EXAM: CT ABDOMEN  AND PELVIS WITH CONTRAST TECHNIQUE: Multidetector CT imaging of the abdomen and pelvis was performed using the standard protocol following bolus administration of intravenous contrast. CONTRAST:  70m OMNIPAQUE IOHEXOL 300 MG/ML  SOLN COMPARISON:  02/19/2020 FINDINGS: Lower chest: The visualized lung bases are clear. At least moderate right coronary artery calcification. Global cardiac size within normal limits. Hepatobiliary: No focal liver abnormality is seen. No gallstones, gallbladder wall thickening, or biliary dilatation. Pancreas: Unremarkable Spleen: Unremarkable Adrenals/Urinary Tract: The adrenal glands are unremarkable. The kidneys are normal in size and position and demonstrate symmetric cortical enhancement. Mild right hydronephrosis with decompression of the right ureter is again identified in keeping with a mild right UPJ obstruction, similar to that noted on prior examination. No hydronephrosis on the left. No intrarenal or ureteral calculi. The bladder is unremarkable. Stomach/Bowel: Mild sigmoid diverticulosis. The stomach, small bowel, and large bowel are otherwise unremarkable. No evidence of obstruction or focal inflammation. The appendix is normal. No free intraperitoneal gas or fluid. Vascular/Lymphatic: Moderate aortoiliac atherosclerotic calcification. No aortic aneurysm. No pathologic adenopathy within the abdomen and pelvis. Reproductive: Status post hysterectomy.  No adnexal masses. Other: No abdominal wall hernia.  The rectum is unremarkable. Musculoskeletal: L3-S1 lumbar fusion with instrumentation and resection of the spinous processes of L4 and L5 are again identified with defect within the right iliac spine likely related to bone graft harvesting. No acute bone abnormality. No lytic or blastic bone lesion. IMPRESSION: No acute intra-abdominal pathology identified. No radiographic explanation for the patient's reported symptoms. Stable mild right UPJ obstruction. No associated renal  cortical atrophy or asymmetric cortical enhancement. Aortic Atherosclerosis (ICD10-I70.0). Electronically Signed   By: Ashesh  Parikh MD   On: 03/06/2021 23:50    Assessment and Plan:   Keora C Sippel is a 60 y.o. y/o female with history of gastric ulcers on Upper endoscopy, exocrine pancreatic insufficiency here for follow-up  Given patient's ongoing epigastric pain and history of persistent gastric ulcers, would recommend repeat upper endoscopy to reevaluate need and if gastric ulcers are still present  Patient has not taken her PPI in 1 week and I have advised her to continue to hold it and obtain gastrin level next week and she verbalized understanding of these instructions  Patient also due for repeat colonoscopy given fair prep on last exam and history of tubular adenoma polyp in 2019  I have discussed alternative options, risks & benefits,  which include, but are not limited to, bleeding, infection, perforation,respiratory complication & drug reaction.  The patient agrees with this plan & written consent will be obtained.     Dr Varnita Tahiliani  

## 2021-04-04 ENCOUNTER — Other Ambulatory Visit: Payer: Self-pay

## 2021-04-08 ENCOUNTER — Encounter: Payer: Self-pay | Admitting: *Deleted

## 2021-04-10 ENCOUNTER — Other Ambulatory Visit: Payer: Self-pay | Admitting: Internal Medicine

## 2021-04-10 NOTE — Telephone Encounter (Signed)
Last filled 02-28-21 #90 Last OV 11-26-20 Next OV 06-30-21 [facility]

## 2021-04-17 ENCOUNTER — Telehealth: Payer: Self-pay | Admitting: Internal Medicine

## 2021-04-17 NOTE — Chronic Care Management (AMB) (Signed)
  Chronic Care Management   Outreach Note  04/17/2021 Name: Molly Peters MRN: [id number] DOB: 09/28/1961  Referred by: Venia Carbon, MD Reason for referral : No chief complaint on file.   An unsuccessful telephone outreach was attempted today. The patient was referred to the pharmacist for assistance with care management and care coordination.   Follow Up Plan:  Tatjana Dellinger Upstream Scheduler

## 2021-04-17 NOTE — Chronic Care Management (AMB) (Signed)
  Chronic Care Management   Note  04/17/2021 Name: Molly Peters MRN: [id number] DOB: [birth year]  Molly Peters is a 60 y.o. year old female who is a primary care patient of Venia Carbon, MD. I reached out to [name] by phone today in response to a referral sent by Molly Peters PCP, Venia Carbon, MD.   Molly Peters was given information about Chronic Care Management services today including:  CCM service includes personalized support from designated clinical staff supervised by her physician, including individualized plan of care and coordination with other care providers 24/7 contact phone numbers for assistance for urgent and routine care needs. Service will only be billed when office clinical staff spend 20 minutes or more in a month to coordinate care. Only one practitioner may furnish and bill the service in a calendar month. The patient may stop CCM services at any time (effective at the end of the month) by phone call to the office staff.   Patient agreed to services and verbal consent obtained.   Follow up plan:   Tatjana Secretary/administrator

## 2021-04-21 ENCOUNTER — Ambulatory Visit: Payer: Medicare HMO | Admitting: Anesthesiology

## 2021-04-21 ENCOUNTER — Other Ambulatory Visit: Payer: Self-pay | Admitting: Gastroenterology

## 2021-04-21 ENCOUNTER — Encounter: Payer: Self-pay | Admitting: Gastroenterology

## 2021-04-21 ENCOUNTER — Other Ambulatory Visit: Payer: Self-pay

## 2021-04-21 ENCOUNTER — Ambulatory Visit
Admission: RE | Admit: 2021-04-21 | Discharge: 2021-04-21 | Disposition: A | Discharge status: Home / Self Care | Payer: Medicare HMO | Attending: Gastroenterology | Admitting: Gastroenterology

## 2021-04-21 ENCOUNTER — Encounter
Admission: RE | Disposition: A | Discharge status: Home / Self Care | Payer: Self-pay | Source: Home / Self Care | Attending: Gastroenterology

## 2021-04-21 DIAGNOSIS — J449 Chronic obstructive pulmonary disease, unspecified: Secondary | ICD-10-CM | POA: Insufficient documentation

## 2021-04-21 DIAGNOSIS — Z79899 Other long term (current) drug therapy: Secondary | ICD-10-CM | POA: Diagnosis not present

## 2021-04-21 DIAGNOSIS — R569 Unspecified convulsions: Secondary | ICD-10-CM | POA: Insufficient documentation

## 2021-04-21 DIAGNOSIS — D123 Benign neoplasm of transverse colon: Secondary | ICD-10-CM | POA: Diagnosis not present

## 2021-04-21 DIAGNOSIS — D125 Benign neoplasm of sigmoid colon: Secondary | ICD-10-CM | POA: Diagnosis not present

## 2021-04-21 DIAGNOSIS — Z86010 Personal history of colonic polyps: Secondary | ICD-10-CM

## 2021-04-21 DIAGNOSIS — K2100 Gastro-esophageal reflux disease with esophagitis, without bleeding: Secondary | ICD-10-CM | POA: Insufficient documentation

## 2021-04-21 DIAGNOSIS — K644 Residual hemorrhoidal skin tags: Secondary | ICD-10-CM | POA: Diagnosis not present

## 2021-04-21 DIAGNOSIS — K253 Acute gastric ulcer without hemorrhage or perforation: Secondary | ICD-10-CM

## 2021-04-21 DIAGNOSIS — Z8541 Personal history of malignant neoplasm of cervix uteri: Secondary | ICD-10-CM | POA: Insufficient documentation

## 2021-04-21 DIAGNOSIS — K2289 Other specified disease of esophagus: Secondary | ICD-10-CM | POA: Diagnosis not present

## 2021-04-21 DIAGNOSIS — Z1211 Encounter for screening for malignant neoplasm of colon: Secondary | ICD-10-CM | POA: Insufficient documentation

## 2021-04-21 DIAGNOSIS — K289 Gastrojejunal ulcer, unspecified as acute or chronic, without hemorrhage or perforation: Secondary | ICD-10-CM | POA: Diagnosis not present

## 2021-04-21 DIAGNOSIS — K259 Gastric ulcer, unspecified as acute or chronic, without hemorrhage or perforation: Secondary | ICD-10-CM | POA: Insufficient documentation

## 2021-04-21 DIAGNOSIS — R1084 Generalized abdominal pain: Secondary | ICD-10-CM

## 2021-04-21 DIAGNOSIS — K648 Other hemorrhoids: Secondary | ICD-10-CM | POA: Diagnosis not present

## 2021-04-21 DIAGNOSIS — K3189 Other diseases of stomach and duodenum: Secondary | ICD-10-CM | POA: Diagnosis not present

## 2021-04-21 DIAGNOSIS — Z90711 Acquired absence of uterus with remaining cervical stump: Secondary | ICD-10-CM | POA: Insufficient documentation

## 2021-04-21 DIAGNOSIS — Z96612 Presence of left artificial shoulder joint: Secondary | ICD-10-CM | POA: Insufficient documentation

## 2021-04-21 DIAGNOSIS — Z888 Allergy status to other drugs, medicaments and biological substances status: Secondary | ICD-10-CM | POA: Diagnosis not present

## 2021-04-21 DIAGNOSIS — K257 Chronic gastric ulcer without hemorrhage or perforation: Secondary | ICD-10-CM | POA: Diagnosis not present

## 2021-04-21 DIAGNOSIS — K635 Polyp of colon: Secondary | ICD-10-CM

## 2021-04-21 DIAGNOSIS — Z66 Do not resuscitate: Secondary | ICD-10-CM | POA: Diagnosis not present

## 2021-04-21 DIAGNOSIS — Z8711 Personal history of peptic ulcer disease: Secondary | ICD-10-CM

## 2021-04-21 DIAGNOSIS — D120 Benign neoplasm of cecum: Secondary | ICD-10-CM | POA: Diagnosis not present

## 2021-04-21 DIAGNOSIS — Z8249 Family history of ischemic heart disease and other diseases of the circulatory system: Secondary | ICD-10-CM | POA: Insufficient documentation

## 2021-04-21 DIAGNOSIS — F17210 Nicotine dependence, cigarettes, uncomplicated: Secondary | ICD-10-CM | POA: Diagnosis not present

## 2021-04-21 DIAGNOSIS — R109 Unspecified abdominal pain: Secondary | ICD-10-CM | POA: Diagnosis not present

## 2021-04-21 DIAGNOSIS — Z885 Allergy status to narcotic agent status: Secondary | ICD-10-CM | POA: Insufficient documentation

## 2021-04-21 DIAGNOSIS — I10 Essential (primary) hypertension: Secondary | ICD-10-CM | POA: Diagnosis not present

## 2021-04-21 DIAGNOSIS — K219 Gastro-esophageal reflux disease without esophagitis: Secondary | ICD-10-CM | POA: Diagnosis not present

## 2021-04-21 DIAGNOSIS — K5730 Diverticulosis of large intestine without perforation or abscess without bleeding: Secondary | ICD-10-CM | POA: Insufficient documentation

## 2021-04-21 HISTORY — PX: ESOPHAGOGASTRODUODENOSCOPY (EGD) WITH PROPOFOL: SHX5813

## 2021-04-21 HISTORY — PX: COLONOSCOPY WITH PROPOFOL: SHX5780

## 2021-04-21 SURGERY — COLONOSCOPY WITH PROPOFOL
Anesthesia: General

## 2021-04-21 MED ORDER — FENTANYL CITRATE (PF) 100 MCG/2ML IJ SOLN
INTRAMUSCULAR | Status: DC | PRN
  Administered 2021-04-21 (×2): 25 ug via INTRAVENOUS

## 2021-04-21 MED ORDER — LIDOCAINE HCL (PF) 2 % IJ SOLN
INTRAMUSCULAR | Status: AC
  Filled 2021-04-21: qty 5

## 2021-04-21 MED ORDER — LIDOCAINE HCL (CARDIAC) PF 100 MG/5ML IV SOSY
PREFILLED_SYRINGE | INTRAVENOUS | Status: DC | PRN
  Administered 2021-04-21: 80 mg via INTRAVENOUS

## 2021-04-21 MED ORDER — PROPOFOL 10 MG/ML IV BOLUS
INTRAVENOUS | Status: DC | PRN
  Administered 2021-04-21: 20 mg via INTRAVENOUS
  Administered 2021-04-21: 50 mg via INTRAVENOUS
  Administered 2021-04-21: 20 mg via INTRAVENOUS

## 2021-04-21 MED ORDER — PROPOFOL 500 MG/50ML IV EMUL
INTRAVENOUS | Status: AC
  Filled 2021-04-21: qty 50

## 2021-04-21 MED ORDER — SODIUM CHLORIDE 0.9 % IV SOLN
INTRAVENOUS | Status: DC
  Administered 2021-04-21: 1000 mL via INTRAVENOUS

## 2021-04-21 MED ORDER — MIDAZOLAM HCL 2 MG/2ML IJ SOLN
INTRAMUSCULAR | Status: AC
  Filled 2021-04-21: qty 2

## 2021-04-21 MED ORDER — PROPOFOL 10 MG/ML IV BOLUS
INTRAVENOUS | Status: AC
  Filled 2021-04-21: qty 20

## 2021-04-21 MED ORDER — PROPOFOL 500 MG/50ML IV EMUL
INTRAVENOUS | Status: DC | PRN
  Administered 2021-04-21: 150 ug/kg/min via INTRAVENOUS

## 2021-04-21 MED ORDER — FENTANYL CITRATE (PF) 100 MCG/2ML IJ SOLN
INTRAMUSCULAR | Status: AC
  Filled 2021-04-21: qty 2

## 2021-04-21 MED ORDER — ONDANSETRON HCL 4 MG/2ML IJ SOLN
INTRAMUSCULAR | Status: DC | PRN
  Administered 2021-04-21: 4 mg via INTRAVENOUS

## 2021-04-21 NOTE — Op Note (Signed)
[facility] Gastroenterology Patient Name: Molly Peters Procedure Date: 04/21/2021 8:05 AM MRN: [id number] Account #: [id number] Date of Birth: 20-Nov-1960 Admit Type: Outpatient Age: 60 Room: [facility] ENDO ROOM 2 Gender: Female Note Status: Finalized Procedure:             Upper GI endoscopy Indications:           Abdominal pain, Follow-up of ulcer of the GI tract Providers:             Hollyann Pablo B. Bonna Gains MD, MD Medicines:             Monitored Anesthesia Care Complications:         No immediate complications. Procedure:             Pre-Anesthesia Assessment:                        - The risks and benefits of the procedure and the                         sedation options and risks were discussed with the                         patient. All questions were answered and informed                         consent was obtained.                        - Patient identification and proposed procedure were                         verified prior to the procedure.                        - ASA Grade Assessment: II - A patient with mild                         systemic disease.                        After obtaining informed consent, the endoscope was                         passed under direct vision. Throughout the procedure,                         the patient's blood pressure, pulse, and oxygen                         saturations were monitored continuously. The Endoscope                         was introduced through the mouth, and advanced to the                         second part of duodenum. The upper GI endoscopy was                         accomplished with ease. The patient tolerated the  procedure well. Findings:      A single 5 mm mucosal nodule was found at the gastroesophageal junction.       Biopsies were taken with a cold forceps for histology.      The exam of the esophagus was otherwise normal.      LA Grade A (one or more mucosal  breaks less than 5 mm, not extending       between tops of 2 mucosal folds) esophagitis with no bleeding was found       in the distal esophagus.      One non-bleeding gastric ulcer with a clean ulcer base (Forrest Class       III) was found in the gastric antrum. The lesion was 6 mm in largest       dimension. Biopsies were taken with a cold forceps for histology.       Biopsies were obtained in the gastric body, at the incisura and in the       gastric antrum with cold forceps for histology.      There was some narrowing or angulation at the site of the ulcer but the       scope was able to pass into the duodenum without resistance.      A single localized erosion with no bleeding and no stigmata of recent       bleeding was found in the gastric antrum. Biopsies were taken with a       cold forceps for histology.      The exam of the stomach was otherwise normal.      The exam of the duodenum was otherwise normal. Impression:            - Mucosal nodule found in the esophagus. Biopsied.                        - LA Grade A reflux esophagitis with no bleeding.                        - Non-bleeding gastric ulcer with a clean ulcer base                         (Forrest Class III). Biopsied.                        - Erosive gastropathy with no bleeding and no stigmata                         of recent bleeding. Biopsied.                        - Biopsies were obtained in the gastric body, at the                         incisura and in the gastric antrum. Recommendation:        - Await pathology results.                        - Take prescribed proton pump inhibitor or H2 blocker                         (antacid) medications 30 - 60 minutes before meals.                        -  Follow an antireflux regimen.                        - Avoid NSAIDs except Aspirin if medically indicated                         by PCP                        - Reommend avoid smoking                        -  Continue present medications.                        - Return to my office as previously scheduled.                        - The findings and recommendations were discussed with                         the patient.                        - The findings and recommendations were discussed with                         the patient's family. Procedure Code(s):     --- Professional ---                        [phone], Esophagogastroduodenoscopy, flexible,                         transoral; with biopsy, single or multiple Diagnosis Code(s):     --- Professional ---                        K22.8, Other specified diseases of esophagus                        K21.00, Gastro-esophageal reflux disease with                         esophagitis, without bleeding                        K25.9, Gastric ulcer, unspecified as acute or chronic,                         without hemorrhage or perforation                        K31.89, Other diseases of stomach and duodenum                        R10.9, Unspecified abdominal pain                        K28.9, Gastrojejunal ulcer, unspecified as acute or                         chronic, without hemorrhage or perforation CPT copyright 2019 [employer] Medical  Association. All rights reserved. The codes documented in this report are preliminary and upon coder review may  be revised to meet current compliance requirements.  [name], MD Margretta Sidle B. Bonna Gains MD, MD 04/21/2021 8:28:16 AM This report has been signed electronically. Number of Addenda: 0 Note Initiated On: 04/21/2021 8:05 AM Estimated Blood Loss:  Estimated blood loss: none.      [facility]

## 2021-04-21 NOTE — H&P (Signed)
[name], MD [address] [place], [place], [place], [id number], [place], [place], [place], [address] Phone: [phone]  Fax: [phone]  Primary Care Physician:  Venia Carbon, MD   Pre-Procedure History & Physical: HPI:  Molly Peters is a 60 y.o. female is here for a colonoscopy and EGD.   Past Medical History:  Diagnosis Date   Anxiety    Cervical cancer ([place]) 1980's   Chronic back pain 11/12/2009   Qualifier: Diagnosis of  By: Maxie Better FNP, Rosalita Levan    COPD (chronic obstructive pulmonary disease) ([place])    states SOB with ADLs; no O2 use; able to speak in complete sentences without SOB(11/15/2014)   Depression    Difficulty swallowing pills    s/p cervical fusion   Family history of adverse reaction to anesthesia    pt's mother and sister have hx. of post-op N/V   Fibromyalgia    GERD (gastroesophageal reflux disease)    Heart murmur    History of gastric ulcer    Hypertension    states under control with med., has been on med. x 1-2 yr.   IBS (irritable bowel syndrome)    Internal hemorrhoid    states has had intermittent bright red bleeding with BM (11/15/2014)   Intraductal papilloma of breast, right 08/08/2018   Limited joint range of motion    neck - s/p cervical fusion   Localized primary osteoarthritis of right shoulder region 11/23/2014   Osteoarthritis of left shoulder 07/05/2015   Osteoarthritis of right shoulder 11/2014   Pneumonia    Seizures ([place])    last seizure 2010   Short-term memory loss    TMJ syndrome    Valvular heart disease    Initial workup a number of years ago at [facility].  Echo (10/2009) showed EF 60-65%,      normal LV size, moderate aortic insufficiency with a trileaflet aortic valve and normal aortic root size.  Mild      mitral regurgitation.    Wears dentures    lower    Past Surgical History:  Procedure Laterality Date   ABDOMINAL HYSTERECTOMY     partial   ANTERIOR CERVICAL DECOMP/DISCECTOMY  FUSION  02/06/2011   C4-5, C5-6, C6-7   BREAST BIOPSY Bilateral 10+ yrs ago   neg   BREAST BIOPSY Right 08/03/2018   2 areas bx, -neg   BREAST LUMPECTOMY Bilateral 1989   x 3 - benign   BREAST LUMPECTOMY Right 08/24/2018   Procedure: BREAST LUMPECTOMY WITH ULTRASOUND IN OR;  Surgeon: Vickie Epley, MD;  Location: [facility] ORS;  Service: General;  Laterality: Right;   [place]     x 2   COLONOSCOPY  09/28/2008   COLONOSCOPY N/A 06/15/2018   Procedure: COLONOSCOPY;  Surgeon: Virgel Manifold, MD;  Location: [facility] ENDOSCOPY;  Service: Endoscopy;  Laterality: N/A;   COLONOSCOPY WITH PROPOFOL N/A 05/04/2019   Procedure: COLONOSCOPY WITH PROPOFOL;  Surgeon: Virgel Manifold, MD;  Location: [facility] ENDOSCOPY;  Service: Endoscopy;  Laterality: N/A;   ESOPHAGOGASTRODUODENOSCOPY  09/28/2008   ESOPHAGOGASTRODUODENOSCOPY N/A 06/14/2018   Procedure: ESOPHAGOGASTRODUODENOSCOPY (EGD);  Surgeon: Virgel Manifold, MD;  Location: [facility] ENDOSCOPY;  Service: Endoscopy;  Laterality: N/A;   ESOPHAGOGASTRODUODENOSCOPY (EGD) WITH PROPOFOL N/A 05/04/2019   Procedure: ESOPHAGOGASTRODUODENOSCOPY (EGD) WITH PROPOFOL;  Surgeon: Virgel Manifold, MD;  Location: [facility] ENDOSCOPY;  Service: Endoscopy;  Laterality: N/A;   ESOPHAGOGASTRODUODENOSCOPY (EGD) WITH PROPOFOL N/A 03/18/2020  Procedure: ESOPHAGOGASTRODUODENOSCOPY (EGD) WITH PROPOFOL;  Surgeon: Lin Landsman, MD;  Location: [facility] ENDOSCOPY;  Service: Gastroenterology;  Laterality: N/A;   HARDWARE REMOVAL  11/17/2006   L5-S1   LAMINECTOMY WITH POSTERIOR LATERAL ARTHRODESIS LEVEL 3  12/06/2009   with synovial cyst resection L3-4 bilat.   LAMINECTOMY WITH POSTERIOR LATERAL ARTHRODESIS LEVEL 3  11/17/2006   L4-5   NM MYOVIEW LTD     Lexiscan myoview (10/2009): EF 77%, normal wall motion, normal perfusion.    SHOULDER ARTHROSCOPY WITH DEBRIDEMENT AND BICEP TENDON REPAIR Right 04/13/2014   Procedure: RIGHT SHOULDER  ARTHROSCOPY WITH DEBRIDEMENT EXTENSIVE;  Surgeon: Johnny Bridge, MD;  Location: [place];  Service: Orthopedics;  Laterality: Right;   TOTAL SHOULDER ARTHROPLASTY Right 11/23/2014   Procedure: TOTAL RIGHT SHOULDER ARTHROPLASTY;  Surgeon: Johnny Bridge, MD;  Location: [place];  Service: Orthopedics;  Laterality: Right;   TOTAL SHOULDER ARTHROPLASTY Left 07/05/2015   Procedure: LEFT TOTAL SHOULDER REPLACEMENT;  Surgeon: Marchia Bond, MD;  Location: [place];  Service: Orthopedics;  Laterality: Left;    Prior to Admission medications   Medication Sig Start Date End Date Taking? Authorizing Provider  fluticasone (FLONASE) 50 MCG/ACT nasal spray Place 2 sprays into both nostrils daily.   Yes [provider]  lamoTRIgine (LAMICTAL) 100 MG tablet Take 1 tablet (100 mg total) by mouth 2 (two) times daily. 06/25/20  Yes Venia Carbon, MD  latanoprost (XALATAN) 0.005 % ophthalmic solution  12/18/19  Yes [provider]  losartan-hydrochlorothiazide (HYZAAR) 100-12.5 MG tablet TAKE 1 TABLET BY MOUTH ONCE DAILY 01/03/21  Yes Venia Carbon, MD  omeprazole (PRILOSEC) 40 MG capsule TAKE 1 CAPSULE BY MOUTH TWICE DAILY BEFORE MEALS 05/01/20  Yes Virgel Manifold, MD  ondansetron (ZOFRAN ODT) 4 MG disintegrating tablet Take 1 tablet (4 mg total) by mouth every 8 (eight) hours as needed for nausea or vomiting. 03/28/21  Yes Viviana Simpler I, MD  PARoxetine (PAXIL) 20 MG tablet TAKE 1 TABLET BY MOUTH ONCE DAILY 10/09/20  Yes Venia Carbon, MD  albuterol (VENTOLIN HFA) 108 (90 Base) MCG/ACT inhaler INHALE 2 PUFFS BY MOUTH EVERY 6 HOURS ASNEEDED WHEEEZING/ SHORTNESS OF BREATH 03/06/21   Venia Carbon, MD  ALPRAZolam (XANAX) 1 MG tablet TAKE 1/2 TO 1 TABLET BY MOUTH 3 TIMES DAILY AS NEEDED ANXIETY 04/10/21   Venia Carbon, MD  Pancrelipase, Lip-Prot-Amyl, 24000-76000 units CPEP Take 2 capsules (48,000 Units total) by mouth with  breakfast, with lunch, and with evening meal AND 1 capsule (24,000 Units total) with snacks. 02/19/21 05/20/21  Virgel Manifold, MD  potassium chloride SA (KLOR-CON) 20 MEQ tablet Take 1 tablet (20 mEq total) by mouth daily. Patient not taking: Reported on 04/21/2021 08/12/20   Lequita Asal, MD  Tiotropium Bromide Monohydrate (SPIRIVA RESPIMAT) 2.5 MCG/ACT AERS Inhale 2 puffs into the lungs daily for 1 day. 01/08/21 01/09/21  Venia Carbon, MD  tiZANidine (ZANAFLEX) 2 MG tablet TAKE 1-2 TABLETS BY MOUTH EVERY 6 HOURS AS NEEDED FOR MUSCLE SPASMS 03/06/21   Venia Carbon, MD    Allergies as of 04/04/2021 - Review Complete 04/03/2021  Allergen Reaction Noted   Carbamazepine Hives 08/26/2015   Divalproex sodium Swelling and Other (See Comments) 01/01/2016   Clonazepam Other (See Comments) 08/12/2005   Diphenhydramine hcl Rash 08/26/2015   Tiotropium bromide monohydrate Other (See Comments)    Vicodin [hydrocodone-acetaminophen] Itching 11/15/2014    Family History  Problem Relation Age  of Onset   Cervical cancer Mother    Anesthesia problems Mother        post-op N/V   Rheum arthritis Mother    Anxiety disorder Mother    Cancer Father        colon cancer   Migraines Sister    Cervical cancer Sister    Anesthesia problems Sister        post-op N/V   Migraines Brother    Asthma Daughter    Cerebral palsy Daughter        age 67   Coronary artery disease Maternal Grandmother    Hypertension Maternal Grandmother    Diabetes Maternal Grandmother    Coronary artery disease Paternal Grandmother    Hypertension Paternal Grandmother    Diabetes Paternal Grandmother    Breast cancer Maternal Aunt    Breast cancer Maternal Aunt    Breast cancer Maternal Aunt    Colon cancer Paternal Uncle    Lung cancer Paternal Uncle     Social History   Socioeconomic History   Marital status: Married    Spouse name: Not on file   Number of children: 3   Years of education: Not on  file   Highest education level: Not on file  Occupational History   Occupation: Disabled 2003  Tobacco Use   Smoking status: Every Day    Packs/day: 1.00    Years: 42.75    Pack years: 42.75    Types: Cigarettes   Smokeless tobacco: Never  Vaping Use   Vaping Use: Never used  Substance and Sexual Activity   Alcohol use: Yes    Alcohol/week: 0.0 standard drinks    Comment: rare   Drug use: Yes    Types: Marijuana    Comment: marijuana 2 days ago   Sexual activity: Not on file  Other Topics Concern   Not on file  Social History Narrative   No living will   Husband, then oldest son Roosvelt Harps, should make decisions   Requests DNR--done 06/25/20   No tube feeds if cognitively unaware   Social Determinants of Health   Financial Resource Strain: Not on file  Food Insecurity: Not on file  Transportation Needs: Not on file  Physical Activity: Not on file  Stress: Not on file  Social Connections: Not on file  Intimate Partner Violence: Not on file    Review of Systems: See HPI, otherwise negative ROS  Physical Exam: BP (!) 160/100   Pulse 84   Temp (!) 97.5 F (36.4 C) (Temporal)   Resp 20   Ht 5' (1.524 m)   Wt 42.7 kg   SpO2 98%   BMI 18.39 kg/m  General:   Alert,  pleasant and cooperative in NAD Head:  Normocephalic and atraumatic. Neck:  Supple; no masses or thyromegaly. Lungs:  Clear throughout to auscultation, normal respiratory effort.    Heart:  +S1, +S2, Regular rate and rhythm, No edema. Abdomen:  Soft, nontender and nondistended. Normal bowel sounds, without guarding, and without rebound.   Neurologic:  Alert and  oriented x4;  grossly normal neurologically.  Impression/Plan: Molly Peters is here for a colonoscopy to be performed for history of polyps and EGD for Abdominal pain, history of gastric ulcers  Risks, benefits, limitations, and alternatives regarding the procedures have been reviewed with the patient.  Questions have been answered.   All parties agreeable.   Virgel Manifold, MD  04/21/2021, 8:01 AM

## 2021-04-21 NOTE — Op Note (Signed)
[facility] Patient Name: Molly Peters Procedure Date: 04/21/2021 8:05 AM MRN: [id number] Account #: [id number] Date of Birth: 04/02/1961 Admit Type: Outpatient Age: 60 Room: [facility] ENDO ROOM 2 Gender: Female Note Status: Finalized Procedure:             Colonoscopy Indications:           Screening for colorectal malignant neoplasm Providers:             Daila Elbert B. Bonna Gains MD, MD Medicines:             Monitored Anesthesia Care Complications:         No immediate complications. Procedure:             Pre-Anesthesia Assessment:                        - ASA Grade Assessment: II - A patient with mild                         systemic disease.                        - Prior to the procedure, a History and Physical was                         performed, and patient medications, allergies and                         sensitivities were reviewed. The patient's tolerance                         of previous anesthesia was reviewed.                        - The risks and benefits of the procedure and the                         sedation options and risks were discussed with the                         patient. All questions were answered and informed                         consent was obtained.                        - Patient identification and proposed procedure were                         verified prior to the procedure by the physician, the                         nurse, the anesthesiologist, the anesthetist and the                         technician. The procedure was verified in the                         procedure room.  After obtaining informed consent, the colonoscope was                         passed under direct vision. Throughout the procedure,                         the patient's blood pressure, pulse, and oxygen                         saturations were monitored continuously. The                          Colonoscope was introduced through the anus and                         advanced to the the cecum, identified by appendiceal                         orifice and ileocecal valve. The colonoscopy was                         performed with ease. The patient tolerated the                         procedure well. The quality of the bowel preparation                         was good. Findings:      The perianal and digital rectal examinations were normal.      Two flat polyps were found in the transverse colon and cecum. The polyps       were 4 to 5 mm in size. These polyps were removed with a cold biopsy       forceps. Resection and retrieval were complete.      Three sessile polyps were found in the sigmoid colon. The polyps were 3       to 4 mm in size. These polyps were removed with a cold biopsy forceps.       Resection and retrieval were complete.      Multiple diverticula were found in the sigmoid colon.      The exam was otherwise without abnormality.      The rectum, sigmoid colon, descending colon, transverse colon, ascending       colon and cecum appeared normal.      External and internal hemorrhoids were found during retroflexion and       during perianal exam.      No additional abnormalities were found on retroflexion. Impression:            - Two 4 to 5 mm polyps in the transverse colon and in                         the cecum, removed with a cold biopsy forceps.                         Resected and retrieved.                        - Three 3 to 4 mm polyps in the sigmoid colon, removed  with a cold biopsy forceps. Resected and retrieved.                        - Diverticulosis in the sigmoid colon.                        - The examination was otherwise normal.                        - The rectum, sigmoid colon, descending colon,                         transverse colon, ascending colon and cecum are normal.                        - External and internal  hemorrhoids. Recommendation:        - Discharge patient to home (with escort).                        - High fiber diet.                        - Advance diet as tolerated.                        - Continue present medications.                        - Await pathology results.                        - Repeat colonoscopy date to be determined after                         pending pathology results are reviewed.                        - The findings and recommendations were discussed with                         the patient.                        - The findings and recommendations were discussed with                         the patient's family.                        - Return to primary care physician as previously                         scheduled. Procedure Code(s):     --- Professional ---                        [phone], Colonoscopy, flexible; with biopsy, single or                         multiple Diagnosis Code(s):     --- Professional ---  Z12.11, Encounter for screening for malignant neoplasm                         of colon                        K63.5, Polyp of colon CPT copyright 2019 American Medical Association. All rights reserved. The codes documented in this report are preliminary and upon coder review may  be revised to meet current compliance requirements.  [name], MD Margretta Sidle B. Bonna Gains MD, MD 04/21/2021 9:13:01 AM This report has been signed electronically. Number of Addenda: 0 Note Initiated On: 04/21/2021 8:05 AM Scope Withdrawal Time: 0 hours 26 minutes 11 seconds  Total Procedure Duration: 0 hours 31 minutes 58 seconds  Estimated Blood Loss:  Estimated blood loss: none.      [facility]

## 2021-04-21 NOTE — Transfer of Care (Signed)
Immediate Anesthesia Transfer of Care Note  Patient: GUNHILD BAUTCH  Procedure(s) Performed: COLONOSCOPY WITH PROPOFOL ESOPHAGOGASTRODUODENOSCOPY (EGD) WITH PROPOFOL  Patient Location: PACU  Anesthesia Type:General  Level of Consciousness: sedated  Airway & Oxygen Therapy: Patient Spontanous Breathing and Patient connected to nasal cannula oxygen  Post-op Assessment: Report given to RN and Post -op Vital signs reviewed and stable  Post vital signs: Reviewed and stable  Last Vitals:  Vitals Value Taken Time  BP 159/88 04/21/21 0910  Temp 35.9 C 04/21/21 0908  Pulse 70 04/21/21 0911  Resp 26 04/21/21 0911  SpO2 91 % 04/21/21 0911  Vitals shown include unvalidated device data.  Last Pain:  Vitals:   04/21/21 0908  TempSrc: Tympanic  PainSc: Asleep         Complications: No notable events documented.

## 2021-04-21 NOTE — Anesthesia Procedure Notes (Signed)
Date/Time: 04/21/2021 8:05 AM Performed by: Allean Found, CRNA Pre-anesthesia Checklist: Patient identified, Emergency Drugs available, Suction available, Patient being monitored and Timeout performed Patient Re-evaluated:Patient Re-evaluated prior to induction Oxygen Delivery Method: Nasal cannula Placement Confirmation: positive ETCO2

## 2021-04-21 NOTE — Anesthesia Postprocedure Evaluation (Signed)
Anesthesia Post Note  Patient: Molly Peters  Procedure(s) Performed: COLONOSCOPY WITH PROPOFOL ESOPHAGOGASTRODUODENOSCOPY (EGD) WITH PROPOFOL  Patient location during evaluation: Phase II Anesthesia Type: General Level of consciousness: awake and alert, awake and oriented Pain management: pain level controlled Vital Signs Assessment: post-procedure vital signs reviewed and stable Respiratory status: spontaneous breathing, nonlabored ventilation and respiratory function stable Cardiovascular status: blood pressure returned to baseline and stable Postop Assessment: no apparent nausea or vomiting Anesthetic complications: no   No notable events documented.   Last Vitals:  Vitals:   04/21/21 0928 04/21/21 0938  BP: (!) 165/94 (!) 148/94  Pulse: 70 65  Resp: 17 16  Temp:    SpO2: 91% 95%    Last Pain:  Vitals:   04/21/21 0938  TempSrc:   PainSc: 0-No pain                 Phill Mutter

## 2021-04-21 NOTE — Anesthesia Preprocedure Evaluation (Signed)
Anesthesia Evaluation  Patient identified by MRN, date of birth, ID band Patient awake    Reviewed: Allergy & Precautions, NPO status , Patient's Chart, lab work & pertinent test results  History of Anesthesia Complications (+) Family history of anesthesia reactionNegative for: history of anesthetic complications (mother and sister with PONV)  Airway Mallampati: II  TM Distance: >3 FB Neck ROM: Full    Dental  (+) Lower Dentures   Pulmonary neg sleep apnea, pneumonia, resolved, COPD,  COPD inhaler, Current Smoker and Patient abstained from smoking.,    Pulmonary exam normal        Cardiovascular hypertension, Pt. on medications (-) Past MI and (-) CHF Normal cardiovascular exam(-) dysrhythmias + Valvular Problems/Murmurs (? aortic insufficiency) MVP      Neuro/Psych Seizures - (none in last 10 years), Well Controlled,  PSYCHIATRIC DISORDERS Anxiety Depression  Neuromuscular disease    GI/Hepatic Neg liver ROS, PUD, Bowel prep,GERD  Medicated and Controlled,  Endo/Other  neg diabetes  Renal/GU negative Renal ROS     Musculoskeletal  (+) Arthritis , Fibromyalgia -  Abdominal   Peds  Hematology  (+) anemia ,   Anesthesia Other Findings Anxiety    Cervical cancer ([place]) 1980's   Chronic back pain 11/12/2009 Qualifier: Diagnosis of By: Maxie Better FNP, Rosalita Levan   COPD (chronic obstructive pulmonary disease) ([place])  states SOB with ADLs; no O2 use; able to speak in complete sentences without SOB(11/15/2014)  Depression    Difficulty swallowing pills  s/p cervical fusion  Family history of adverse reaction to anesthesia  pt's mother and sister have hx. of post-op N/V  Fibromyalgia    GERD (gastroesophageal reflux disease)    Heart murmur    History of gastric ulcer    Hypertension  states under control with med., has been on med. x 1-2 yr.  IBS (irritable bowel syndrome)    Internal hemorrhoid  states has had  intermittent bright red bleeding with BM (11/15/2014)  Intraductal papilloma of breast, right 08/08/2018   Limited joint range of motion  neck - s/p cervical fusion  Localized primary osteoarthritis of right shoulder region 11/23/2014   Osteoarthritis of left shoulder 07/05/2015   Osteoarthritis of right shoulder 11/2014   Pneumonia    Seizures ([place])  last seizure 2010  Short-term memory loss    TMJ syndrome    Valvular heart disease       Reproductive/Obstetrics                             Anesthesia Physical  Anesthesia Plan  ASA: 3  Anesthesia Plan: General   Post-op Pain Management:    Induction: Intravenous  PONV Risk Score and Plan: 2 and TIVA and Propofol infusion  Airway Management Planned: Nasal Cannula and Natural Airway  Additional Equipment:   Intra-op Plan:   Post-operative Plan:   Informed Consent: I have reviewed the patients History and Physical, chart, labs and discussed the procedure including the risks, benefits and alternatives for the proposed anesthesia with the patient or authorized representative who has indicated his/her understanding and acceptance.       Plan Discussed with: CRNA, Anesthesiologist and Surgeon  Anesthesia Plan Comments:         Anesthesia Quick Evaluation

## 2021-04-22 ENCOUNTER — Encounter: Payer: Self-pay | Admitting: Gastroenterology

## 2021-04-22 LAB — GASTRIN: Gastrin: 14 pg/mL (ref 0–115)

## 2021-04-22 LAB — SURGICAL PATHOLOGY

## 2021-04-25 ENCOUNTER — Telehealth: Payer: Self-pay

## 2021-04-25 ENCOUNTER — Encounter: Payer: Self-pay | Admitting: Gastroenterology

## 2021-04-25 NOTE — Telephone Encounter (Signed)
-----   Message from Virgel Manifold, MD sent at 04/25/2021 11:54 AM EDT ----- Please ensure pt has clinic follow up in 4-6 weeks so I can discuss her gastric ulcer and further management of this in detail with her.

## 2021-04-25 NOTE — Telephone Encounter (Signed)
Made follow up appointment and informed patient of this information

## 2021-04-25 NOTE — Progress Notes (Signed)
7

## 2021-04-29 ENCOUNTER — Inpatient Hospital Stay: Payer: Medicare HMO | Attending: Nurse Practitioner

## 2021-04-29 ENCOUNTER — Other Ambulatory Visit: Payer: Self-pay | Admitting: Internal Medicine

## 2021-04-29 ENCOUNTER — Other Ambulatory Visit: Payer: Self-pay

## 2021-04-29 DIAGNOSIS — R911 Solitary pulmonary nodule: Secondary | ICD-10-CM | POA: Diagnosis not present

## 2021-04-29 DIAGNOSIS — K922 Gastrointestinal hemorrhage, unspecified: Secondary | ICD-10-CM | POA: Insufficient documentation

## 2021-04-29 DIAGNOSIS — E538 Deficiency of other specified B group vitamins: Secondary | ICD-10-CM | POA: Insufficient documentation

## 2021-04-29 DIAGNOSIS — J449 Chronic obstructive pulmonary disease, unspecified: Secondary | ICD-10-CM | POA: Insufficient documentation

## 2021-04-29 DIAGNOSIS — Z79899 Other long term (current) drug therapy: Secondary | ICD-10-CM | POA: Diagnosis not present

## 2021-04-29 DIAGNOSIS — E876 Hypokalemia: Secondary | ICD-10-CM | POA: Diagnosis not present

## 2021-04-29 DIAGNOSIS — D500 Iron deficiency anemia secondary to blood loss (chronic): Secondary | ICD-10-CM | POA: Insufficient documentation

## 2021-04-29 MED ORDER — CYANOCOBALAMIN 1000 MCG/ML IJ SOLN
1000.0000 ug | Freq: Once | INTRAMUSCULAR | Status: AC
  Administered 2021-04-29: 1000 ug via INTRAMUSCULAR
  Filled 2021-04-29: qty 1

## 2021-04-29 NOTE — Telephone Encounter (Signed)
Last filled 03-06-21 #30 Last OV Acute 11-26-20 Next OV 06-30-21 [facility]

## 2021-05-29 ENCOUNTER — Other Ambulatory Visit: Payer: Self-pay

## 2021-05-29 ENCOUNTER — Other Ambulatory Visit: Payer: Self-pay | Admitting: Internal Medicine

## 2021-05-29 ENCOUNTER — Inpatient Hospital Stay: Payer: Medicare HMO | Attending: Oncology

## 2021-05-29 VITALS — Wt 94.0 lb

## 2021-05-29 DIAGNOSIS — K922 Gastrointestinal hemorrhage, unspecified: Secondary | ICD-10-CM | POA: Insufficient documentation

## 2021-05-29 DIAGNOSIS — D500 Iron deficiency anemia secondary to blood loss (chronic): Secondary | ICD-10-CM | POA: Insufficient documentation

## 2021-05-29 DIAGNOSIS — E538 Deficiency of other specified B group vitamins: Secondary | ICD-10-CM | POA: Diagnosis not present

## 2021-05-29 MED ORDER — CYANOCOBALAMIN 1000 MCG/ML IJ SOLN
1000.0000 ug | Freq: Once | INTRAMUSCULAR | Status: AC
  Administered 2021-05-29: 1000 ug via INTRAMUSCULAR
  Filled 2021-05-29: qty 1

## 2021-05-29 NOTE — Telephone Encounter (Signed)
Last filled 04-10-21 #90 Last OV 11-26-20 Next OV 06-30-21 [facility]

## 2021-05-30 ENCOUNTER — Other Ambulatory Visit: Payer: Self-pay | Admitting: Gastroenterology

## 2021-05-30 ENCOUNTER — Other Ambulatory Visit: Payer: Self-pay | Admitting: Internal Medicine

## 2021-05-30 DIAGNOSIS — K219 Gastro-esophageal reflux disease without esophagitis: Secondary | ICD-10-CM

## 2021-06-02 ENCOUNTER — Other Ambulatory Visit: Payer: Self-pay

## 2021-06-02 ENCOUNTER — Ambulatory Visit: Payer: Medicare HMO | Admitting: Gastroenterology

## 2021-06-02 ENCOUNTER — Encounter: Payer: Self-pay | Admitting: Gastroenterology

## 2021-06-02 VITALS — BP 154/91 | HR 77 | Temp 98.1°F | Ht 60.0 in | Wt 94.4 lb

## 2021-06-02 DIAGNOSIS — K279 Peptic ulcer, site unspecified, unspecified as acute or chronic, without hemorrhage or perforation: Secondary | ICD-10-CM | POA: Diagnosis not present

## 2021-06-02 DIAGNOSIS — K8681 Exocrine pancreatic insufficiency: Secondary | ICD-10-CM

## 2021-06-02 DIAGNOSIS — Z8711 Personal history of peptic ulcer disease: Secondary | ICD-10-CM | POA: Diagnosis not present

## 2021-06-02 MED ORDER — PANTOPRAZOLE SODIUM 40 MG PO TBEC: 40.0000 mg | DELAYED_RELEASE_TABLET | Freq: Two times a day (BID) | ORAL | 0 refills | Status: DC

## 2021-06-02 NOTE — Progress Notes (Signed)
Molly Antigua, MD [address]  [place]  [place], [place] [address]  Main: [phone]  Fax: [phone]   Primary Care Physician: Venia Carbon, MD   Chief Complaint  Patient presents with   Follow-up    discuss her gastric ulcer and management     HPI: Molly Peters is a 60 y.o. female here for follow-up of gastric ulcers. The patient denies abdominal or flank pain, anorexia, nausea or vomiting, dysphagia, change in bowel habits or black or bloody stools or weight loss.  Patient is taking omeprazole once daily.  Continues to smoke.  Most recent gastrin level was normal.  After further questioning, does admit to Aleve use 1-2 times a week.  Denies melena.   ROS: All ROS reviewed and negative except as per HPI   Past Medical History:  Diagnosis Date   Anxiety    Cervical cancer ([place]) 1980's   Chronic back pain 11/12/2009   Qualifier: Diagnosis of  By: Maxie Better FNP, Rosalita Levan    COPD (chronic obstructive pulmonary disease) ([place])    states SOB with ADLs; no O2 use; able to speak in complete sentences without SOB(11/15/2014)   Depression    Difficulty swallowing pills    s/p cervical fusion   Family history of adverse reaction to anesthesia    pt's mother and sister have hx. of post-op N/V   Fibromyalgia    GERD (gastroesophageal reflux disease)    Heart murmur    History of gastric ulcer    Hypertension    states under control with med., has been on med. x 1-2 yr.   IBS (irritable bowel syndrome)    Internal hemorrhoid    states has had intermittent bright red bleeding with BM (11/15/2014)   Intraductal papilloma of breast, right 08/08/2018   Limited joint range of motion    neck - s/p cervical fusion   Localized primary osteoarthritis of right shoulder region 11/23/2014   Osteoarthritis of left shoulder 07/05/2015   Osteoarthritis of right shoulder 11/2014   Pneumonia    Seizures ([place])    last seizure 2010   Short-term memory loss     TMJ syndrome    Valvular heart disease    Initial workup a number of years ago at [facility].  Echo (10/2009) showed EF 60-65%,      normal LV size, moderate aortic insufficiency with a trileaflet aortic valve and normal aortic root size.  Mild      mitral regurgitation.    Wears dentures    lower    Past Surgical History:  Procedure Laterality Date   ABDOMINAL HYSTERECTOMY     partial   ANTERIOR CERVICAL DECOMP/DISCECTOMY FUSION  02/06/2011   C4-5, C5-6, C6-7   BREAST BIOPSY Bilateral 10+ yrs ago   neg   BREAST BIOPSY Right 08/03/2018   2 areas bx, -neg   BREAST LUMPECTOMY Bilateral 1989   x 3 - benign   BREAST LUMPECTOMY Right 08/24/2018   Procedure: BREAST LUMPECTOMY WITH ULTRASOUND IN OR;  Surgeon: Vickie Epley, MD;  Location: [facility] ORS;  Service: General;  Laterality: Right;   [place]     x 2   COLONOSCOPY  09/28/2008   COLONOSCOPY N/A 06/15/2018   Procedure: COLONOSCOPY;  Surgeon: Virgel Manifold, MD;  Location: [facility] ENDOSCOPY;  Service: Endoscopy;  Laterality: N/A;   COLONOSCOPY WITH PROPOFOL N/A 05/04/2019   Procedure: COLONOSCOPY WITH PROPOFOL;  Surgeon: Virgel Manifold,  MD;  Location: [facility] ENDOSCOPY;  Service: Endoscopy;  Laterality: N/A;   COLONOSCOPY WITH PROPOFOL N/A 04/21/2021   Procedure: COLONOSCOPY WITH PROPOFOL;  Surgeon: Virgel Manifold, MD;  Location: [facility] ENDOSCOPY;  Service: Endoscopy;  Laterality: N/A;   ESOPHAGOGASTRODUODENOSCOPY  09/28/2008   ESOPHAGOGASTRODUODENOSCOPY N/A 06/14/2018   Procedure: ESOPHAGOGASTRODUODENOSCOPY (EGD);  Surgeon: Virgel Manifold, MD;  Location: [facility] ENDOSCOPY;  Service: Endoscopy;  Laterality: N/A;   ESOPHAGOGASTRODUODENOSCOPY (EGD) WITH PROPOFOL N/A 05/04/2019   Procedure: ESOPHAGOGASTRODUODENOSCOPY (EGD) WITH PROPOFOL;  Surgeon: Virgel Manifold, MD;  Location: [facility] ENDOSCOPY;  Service: Endoscopy;  Laterality: N/A;   ESOPHAGOGASTRODUODENOSCOPY (EGD) WITH PROPOFOL N/A  03/18/2020   Procedure: ESOPHAGOGASTRODUODENOSCOPY (EGD) WITH PROPOFOL;  Surgeon: Lin Landsman, MD;  Location: [facility] ENDOSCOPY;  Service: Gastroenterology;  Laterality: N/A;   ESOPHAGOGASTRODUODENOSCOPY (EGD) WITH PROPOFOL N/A 04/21/2021   Procedure: ESOPHAGOGASTRODUODENOSCOPY (EGD) WITH PROPOFOL;  Surgeon: Virgel Manifold, MD;  Location: [facility] ENDOSCOPY;  Service: Endoscopy;  Laterality: N/A;   HARDWARE REMOVAL  11/17/2006   L5-S1   LAMINECTOMY WITH POSTERIOR LATERAL ARTHRODESIS LEVEL 3  12/06/2009   with synovial cyst resection L3-4 bilat.   LAMINECTOMY WITH POSTERIOR LATERAL ARTHRODESIS LEVEL 3  11/17/2006   L4-5   NM MYOVIEW LTD     Lexiscan myoview (10/2009): EF 77%, normal wall motion, normal perfusion.    SHOULDER ARTHROSCOPY WITH DEBRIDEMENT AND BICEP TENDON REPAIR Right 04/13/2014   Procedure: RIGHT SHOULDER ARTHROSCOPY WITH DEBRIDEMENT EXTENSIVE;  Surgeon: Johnny Bridge, MD;  Location: [facility];  Service: Orthopedics;  Laterality: Right;   TOTAL SHOULDER ARTHROPLASTY Right 11/23/2014   Procedure: TOTAL RIGHT SHOULDER ARTHROPLASTY;  Surgeon: Johnny Bridge, MD;  Location: [facility];  Service: Orthopedics;  Laterality: Right;   TOTAL SHOULDER ARTHROPLASTY Left 07/05/2015   Procedure: LEFT TOTAL SHOULDER REPLACEMENT;  Surgeon: Marchia Bond, MD;  Location: [facility];  Service: Orthopedics;  Laterality: Left;    Prior to Admission medications   Medication Sig Start Date End Date Taking? Authorizing Provider  albuterol (VENTOLIN HFA) 108 (90 Base) MCG/ACT inhaler INHALE 2 PUFFS BY MOUTH EVERY 6 HOURS ASNEEDED WHEEEZING/ SHORTNESS OF BREATH 03/06/21  Yes Venia Carbon, MD  ALPRAZolam (XANAX) 1 MG tablet TAKE 1/2 TO 1 TABLET BY MOUTH 3 TIMES DAILY AS NEEDED ANXIETY 05/30/21  Yes Venia Carbon, MD  fluticasone (FLONASE) 50 MCG/ACT nasal spray Place 2 sprays into both nostrils daily.   Yes [provider]  lamoTRIgine  (LAMICTAL) 100 MG tablet Take 1 tablet (100 mg total) by mouth 2 (two) times daily. 06/25/20  Yes Venia Carbon, MD  losartan-hydrochlorothiazide (HYZAAR) 100-12.5 MG tablet TAKE 1 TABLET BY MOUTH ONCE DAILY 01/03/21  Yes Viviana Simpler I, MD  ondansetron (ZOFRAN-ODT) 4 MG disintegrating tablet DISSOLVE 1 TABLET ON THE TONGUE EVERY 8 HOURS AS NEEDED FOR NAUSEA OR VOMIT 05/30/21  Yes Venia Carbon, MD  Pancrelipase, Lip-Prot-Amyl, (CREON) 24000-76000 units CPEP Take by mouth. 2 capsules with each meal and 1 capsule with snack   Yes [provider]  pantoprazole (PROTONIX) 40 MG tablet Take 1 tablet (40 mg total) by mouth 2 (two) times daily before a meal. 06/02/21  Yes Molly Peters B, MD  PARoxetine (PAXIL) 20 MG tablet TAKE 1 TABLET BY MOUTH ONCE DAILY 05/30/21  Yes Viviana Simpler I, MD  tiZANidine (ZANAFLEX) 2 MG tablet TAKE 1-2 TABLETS BY MOUTH EVERY 6 HOURS AS NEEDED FOR MUSCLE SPASMS 05/30/21  Yes Venia Carbon, MD  Tiotropium Bromide  Monohydrate (SPIRIVA RESPIMAT) 2.5 MCG/ACT AERS Inhale 2 puffs into the lungs daily for 1 day. Patient not taking: Reported on 06/02/2021 01/08/21 01/09/21  Venia Carbon, MD    Family History  Problem Relation Age of Onset   Cervical cancer Mother    Anesthesia problems Mother        post-op N/V   Rheum arthritis Mother    Anxiety disorder Mother    Cancer Father        colon cancer   Migraines Sister    Cervical cancer Sister    Anesthesia problems Sister        post-op N/V   Migraines Brother    Asthma Daughter    Cerebral palsy Daughter        age 70   Coronary artery disease Maternal Grandmother    Hypertension Maternal Grandmother    Diabetes Maternal Grandmother    Coronary artery disease Paternal Grandmother    Hypertension Paternal Grandmother    Diabetes Paternal Grandmother    Breast cancer Maternal Aunt    Breast cancer Maternal Aunt    Breast cancer Maternal Aunt    Colon cancer Paternal Uncle    Lung cancer  Paternal Uncle      Social History   Tobacco Use   Smoking status: Every Day    Packs/day: 1.00    Years: 42.75    Pack years: 42.75    Types: Cigarettes   Smokeless tobacco: Never  Vaping Use   Vaping Use: Never used  Substance Use Topics   Alcohol use: Yes    Alcohol/week: 0.0 standard drinks    Comment: rare   Drug use: Yes    Types: Marijuana    Comment: marijuana 2 days ago    Allergies as of 06/02/2021 - Review Complete 06/02/2021  Allergen Reaction Noted   Carbamazepine Hives 08/26/2015   Divalproex sodium Swelling and Other (See Comments) 01/01/2016   Clonazepam Other (See Comments) 08/12/2005   Diphenhydramine hcl Rash 08/26/2015   Tiotropium bromide monohydrate Other (See Comments)    Vicodin [hydrocodone-acetaminophen] Itching 11/15/2014    Physical Examination:  Constitutional: General:   Alert,  Well-developed, well-nourished, pleasant and cooperative in NAD BP (!) 154/91   Pulse 77   Temp 98.1 F (36.7 C) (Oral)   Ht 5' (1.524 m)   Wt 94 lb 6.4 oz (42.8 kg)   BMI 18.44 kg/m   Respiratory: Normal respiratory effort  Gastrointestinal:  Soft, non-tender and non-distended without masses, hepatosplenomegaly or hernias noted.  No guarding or rebound tenderness.     Cardiac: No clubbing or edema.  No cyanosis. Normal posterior tibial pedal pulses noted.  Psych:  Alert and cooperative. Normal mood and affect.  Musculoskeletal:  Normal gait. Head normocephalic, atraumatic. Symmetrical without gross deformities. 5/5 Lower extremity strength bilaterally.  Skin: Warm. Intact without significant lesions or rashes. No jaundice.  Neck: Supple, trachea midline  Lymph: No cervical lymphadenopathy  Psych:  Alert and oriented x3, Alert and cooperative. Normal mood and affect.  Labs: CMP     Component Value Date/Time   NA 138 03/06/2021 1849   K 3.0 (L) 03/06/2021 1849   CL 101 03/06/2021 1849   CO2 28 03/06/2021 1849   GLUCOSE 99 03/06/2021 1849    BUN 10 03/06/2021 1849   CREATININE 0.85 03/06/2021 1849   CALCIUM 9.2 03/06/2021 1849   PROT 8.3 (H) 01/27/2021 1302   ALBUMIN 4.6 01/27/2021 1302   AST 26 01/27/2021 1302   ALT 12  01/27/2021 1302   ALKPHOS 72 01/27/2021 1302   BILITOT 0.5 01/27/2021 1302   GFRNONAA >60 03/06/2021 1849   GFRAA >60 08/12/2020 1313   Lab Results  Component Value Date   WBC 9.2 03/06/2021   HGB 14.4 03/06/2021   HCT 41.7 03/06/2021   MCV 96.3 03/06/2021   PLT 307 03/06/2021    Imaging Studies:   Assessment and Plan:   AQUANETTE LANDECK is a 60 y.o. y/o female here for follow-up of persistent gastric ulcers on repeat upper endoscopies  Patient's gastrin level has been normal and previous imaging has not shown any evidence of tumor  Persistent gastric ulcers likely due to patient's continued NSAID use given that she is admitting to Aleve use about once or twice a week, in addition to her smoking  Patient highly encouraged to work on quitting smoking as she can and she verbalized understanding  Importance of avoiding NSAID use discussed in detail today and she verbalized understanding  I will check H. pylori serology to confirm absence of H. pylori given her persistent ulcers  We will discontinue omeprazole and try alternative PPI given persistent ulceration and she is agreeable to this.  Full dose Protonix indicated at this time given persistent ulcers  (Risks of PPI use were discussed with patient including bone loss, C. Diff diarrhea, pneumonia, infections, CKD, electrolyte abnormalities.  Pt. Verbalizes understanding and chooses to continue the medication.)  Repeat upper endoscopy in 2 to 3 months then possibly decrease PPI at that time if improvement in ulcers  Patient is on Creon and this is helping her.  However, she only takes 2 pills with 2 meals during the day and states has about 5 snacks during the day out of which, she only takes Creon with one of them.  She was encouraged to take  Creon with at least 2-3 snacks.  With the current dose of Creon, she is reporting improvement in her previous abdominal bloating and diarrhea symptoms.  However, still having some bloating, which is much better than before    Dr Molly Peters

## 2021-06-04 LAB — H PYLORI, IGM, IGG, IGA AB
H pylori, IgM Abs: <9 units (ref 0.0–8.9)
H. pylori, IgA Abs: <9 units (ref 0.0–8.9)
H. pylori, IgG AbS: 0.25 Index Value (ref 0.00–0.79)

## 2021-06-05 ENCOUNTER — Ambulatory Visit: Payer: Medicare HMO | Admitting: Gastroenterology

## 2021-06-09 ENCOUNTER — Telehealth: Payer: Self-pay

## 2021-06-09 NOTE — Chronic Care Management (AMB) (Addendum)
Chronic Care Management Pharmacy Assistant   Name: KHADY DUWE  MRN: [id number] DOB: 1961-10-01  Molly Peters is an 60 y.o. year old female who presents for his initial CCM visit with the clinical pharmacist.  Reason for Encounter: Initial Questions   Conditions to be addressed/monitored: HTN, HLD, and COPD  Recent office visits:  None in 6 months  Recent consult visits:  06/02/21 - Gastroenterology - Patient presented for gastric ulcers. Stopped omeprazole and started Pantoprazole '40mg'$  take 1 tablet 2 times daily. 04/21/21 [facility] - Patient presented for colonoscopy.  04/03/21 - Gastroenterology - Patient presented for follow up gastric ulcers. Colonoscopy scheduled. 04/01/21 [facility] - Patient presented for B12 injection. 02/19/21 - Gastroenterology - Patient presented for rectal bleeding. Nifedipine ointment sent to specialty pharmacy. Please take pancrelipase, two with each meal and one with snack. 01/27/21 - Oncology - Patient presented for follow up anemia, B-12 deficiency, weight loss labs ordered  B12 injection no medication changes 12/30/20 [facility] - Patient presented for B12 injection.  Hospital visits:  03/06/21 [facility] - ED patient presented for rectal bleeding SOB chest pain xrays CT scans labs ordered started the patiet on stool softners no admission   Medications: Outpatient Encounter Medications as of 06/09/2021  Medication Sig   albuterol (VENTOLIN HFA) 108 (90 Base) MCG/ACT inhaler INHALE 2 PUFFS BY MOUTH EVERY 6 HOURS ASNEEDED WHEEEZING/ SHORTNESS OF BREATH   ALPRAZolam (XANAX) 1 MG tablet TAKE 1/2 TO 1 TABLET BY MOUTH 3 TIMES DAILY AS NEEDED ANXIETY   fluticasone (FLONASE) 50 MCG/ACT nasal spray Place 2 sprays into both nostrils daily.   lamoTRIgine (LAMICTAL) 100 MG tablet Take 1 tablet (100 mg total) by mouth 2 (two) times daily.   losartan-hydrochlorothiazide (HYZAAR) 100-12.5 MG tablet TAKE 1 TABLET BY MOUTH ONCE DAILY   ondansetron (ZOFRAN-ODT) 4 MG  disintegrating tablet DISSOLVE 1 TABLET ON THE TONGUE EVERY 8 HOURS AS NEEDED FOR NAUSEA OR VOMIT   Pancrelipase, Lip-Prot-Amyl, (CREON) 24000-76000 units CPEP Take by mouth. 2 capsules with each meal and 1 capsule with snack   pantoprazole (PROTONIX) 40 MG tablet Take 1 tablet (40 mg total) by mouth 2 (two) times daily before a meal.   PARoxetine (PAXIL) 20 MG tablet TAKE 1 TABLET BY MOUTH ONCE DAILY   Tiotropium Bromide Monohydrate (SPIRIVA RESPIMAT) 2.5 MCG/ACT AERS Inhale 2 puffs into the lungs daily for 1 day. (Patient not taking: Reported on 06/02/2021)   tiZANidine (ZANAFLEX) 2 MG tablet TAKE 1-2 TABLETS BY MOUTH EVERY 6 HOURS AS NEEDED FOR MUSCLE SPASMS   No facility-administered encounter medications on file as of 06/09/2021.   No results found for: HGBA1C, MICROALBUR   BP Readings from Last 3 Encounters:  06/02/21 (!) 154/91  04/21/21 (!) 148/94  04/03/21 134/86    Patient contacted to review initial questions prior to visit with Debbora Dus.  Have you seen any other providers since your last visit with PCP? Yes  SHARRIE BRILLANTES did not answer the phone to remind her to have all medications, supplements and any blood glucose and blood pressure readings available for review with Debbora Dus, Pharm. D, at her telephone visit on 06/10/2021 at 8:30am. Message left on machine with details  The phone number listed at time of call was husband's cell number so the husband gave me the home phone number  [phone]. Unable to reach at home number.  Star Rating Drugs:  Medication:  Last Fill: Day Supply Losartan 100-12.'5mg'$  01/03/21  90  Debbora Dus, CPP notified  Avel Sensor, [place] Assistant [phone]  I have reviewed the care management and care coordination activities outlined in this encounter and I am certifying that I agree with the content of this note. No further action required.  Debbora Dus, PharmD Clinical Pharmacist [facility]  at [facility] [phone]

## 2021-06-09 NOTE — Progress Notes (Signed)
Entered in Error

## 2021-06-10 ENCOUNTER — Ambulatory Visit (INDEPENDENT_AMBULATORY_CARE_PROVIDER_SITE_OTHER): Payer: Medicare HMO

## 2021-06-10 ENCOUNTER — Telehealth: Payer: Self-pay

## 2021-06-10 ENCOUNTER — Other Ambulatory Visit: Payer: Self-pay

## 2021-06-10 DIAGNOSIS — I10 Essential (primary) hypertension: Secondary | ICD-10-CM

## 2021-06-10 DIAGNOSIS — J449 Chronic obstructive pulmonary disease, unspecified: Secondary | ICD-10-CM | POA: Diagnosis not present

## 2021-06-10 MED ORDER — SPIRIVA RESPIMAT 2.5 MCG/ACT IN AERS: 2.0000 | INHALATION_SPRAY | Freq: Every day | RESPIRATORY_TRACT | 11 refills | Status: DC

## 2021-06-10 NOTE — Telephone Encounter (Signed)
This has been updated.

## 2021-06-10 NOTE — Progress Notes (Signed)
Chronic Care Management Pharmacy Note  06/10/2021 Name:  Molly Peters MRN:  [id number] DOB:  October 09, 1961  Summary: Patient ran out of Spiriva samples, needs refill. She is using albuterol inhaler most days. She also used to be on statin and had not lipid panel updated since 2015. She is past due for losartan-hctz refill. Denies missed doses. No other medication concerns identified.  Recommendations/Changes made from today's visit: Update lipid panel at [facility] this month.  Plan: -Send PCP request to refill Spiriva, encouraged patient to follow up with pulmonology -PCP visit scheduled this month -CCM in 3 months   Subjective: Molly Peters is an 60 y.o. year old female who is a primary patient of Venia Carbon, MD.  The CCM team was consulted for assistance with disease management and care coordination needs.    Engaged with patient by telephone for initial visit in response to provider referral for pharmacy case management and/or care coordination services.   Consent to Services:  The patient was given the following information about Chronic Care Management services today, agreed to services, and gave verbal consent: 1. CCM service includes personalized support from designated clinical staff supervised by the primary care provider, including individualized plan of care and coordination with other care providers 2. 24/7 contact phone numbers for assistance for urgent and routine care needs. 3. Service will only be billed when office clinical staff spend 20 minutes or more in a month to coordinate care. 4. Only one practitioner may furnish and bill the service in a calendar month. 5.The patient may stop CCM services at any time (effective at the end of the month) by phone call to the office staff. 6. The patient will be responsible for cost sharing (co-pay) of up to 20% of the service fee (after annual deductible is met). Patient agreed to services and consent obtained.  Patient  Care Team: Venia Carbon, MD as PCP - General Virgel Manifold, MD as Consulting Physician (Gastroenterology) Vickie Epley, MD (Inactive) as Consulting Physician (General Surgery) Lequita Asal, MD as Referring Physician (Hematology and Oncology) Debbora Dus, [facility] as Pharmacist (Pharmacist)  Recent office visits:  11/26/20 - Dr. Silvio Pate, PCP - COPD - Visit cancelled 06/25/21 - Dr. Silvio Pate, PCP - Pt presented for [facility]. Referral to pulmonology.   Recent consult visits:  06/02/21 - Gastroenterology - Patient presented for gastric ulcers. Stopped omeprazole and started Pantoprazole 70m take 1 tablet 2 times daily. Take Creon with meals and at least 2-3 snacks.  05/29/21 - Hematology - Iron sucrose infusion, monthly B12 injection 04/03/21 - Gastroenterology - Patient presented for follow up gastric ulcers. Colonoscopy scheduled. 04/01/21 - Hematology - Patient presented for B12 injection. 02/19/21 - Gastroenterology - Patient presented for rectal bleeding. Nifedipine ointment sent to specialty pharmacy. Please take pancrelipase, two with each meal and one with snack. 01/27/21 - Oncology - Patient presented for follow up anemia, B-12 deficiency, weight loss labs ordered  B12 injection no medication changes 12/30/20 -[facility]- Patient presented for B12 injection 10/08/20 - PFTS - No obvious evidence of restrictive lung disease, f/u as needed   Hospital visits:  04/21/21 -[facility]- Patient presented for colonoscopy.  03/06/21 -[facility]- ED patient presented for rectal bleeding SOB chest pain xrays CT scans labs ordered started the stool softeners no admission 11/26/20 - Urgent Care - COPD exacerbation     Objective:  Lab Results  Component Value Date   CREATININE 0.85 03/06/2021   BUN 10  03/06/2021   GFR 55.36 (L) 06/20/2019   GFRNONAA >60 03/06/2021   GFRAA >60 08/12/2020   NA 138 03/06/2021   K 3.0 (L) 03/06/2021   CALCIUM 9.2 03/06/2021   CO2 28 03/06/2021   GLUCOSE 99  03/06/2021    Lab Results  Component Value Date/Time   GFR 55.36 (L) 06/20/2019 11:41 AM   GFR 61.98 10/27/2017 10:48 AM    Lab Results  Component Value Date   CHOL 148 04/05/2014   HDL 42.70 04/05/2014   LDLCALC 82 04/05/2014   LDLDIRECT 155.7 10/10/2009   TRIG 116.0 04/05/2014   CHOLHDL 3 04/05/2014    Hepatic Function Latest Ref Rng & Units 01/27/2021 08/12/2020 04/18/2020  Total Protein 6.5 - 8.1 g/dL 8.3(H) 7.4 7.7  Albumin 3.5 - 5.0 g/dL 4.6 4.2 4.3  AST 15 - 41 U/L '26 24 23  ' ALT 0 - 44 U/L '12 12 14  ' Alk Phosphatase 38 - 126 U/L 72 65 73  Total Bilirubin 0.3 - 1.2 mg/dL 0.5 0.7 0.2(L)  Bilirubin, Direct 0.0 - 0.3 mg/dL - - -    Lab Results  Component Value Date/Time   TSH 1.256 07/05/2018 12:35 PM   TSH 0.30 (L) 04/04/2014 01:02 PM   TSH 0.43 04/05/2013 11:50 AM   FREET4 0.67 01/26/2020 01:07 PM   FREET4 0.76 06/20/2019 11:41 AM    CBC Latest Ref Rng & Units 03/06/2021 01/27/2021 11/04/2020  WBC 4.0 - 10.5 K/uL 9.2 8.4 8.0  Hemoglobin 12.0 - 15.0 g/dL 14.4 15.8(H) 15.7(H)  Hematocrit 36.0 - 46.0 % 41.7 44.3 44.5  Platelets 150 - 400 K/uL 307 347 337    Lab Results  Component Value Date/Time   VD25OH 10.5 (L) 02/18/2016 02:16 PM   VD25OH - Total on 03/12/20 38 (Gastroenterology)  Clinical ASCVD: Yes  The ASCVD Risk score Mikey Bussing DC Jr., et al., 2013) failed to calculate for the following reasons:   Cannot find a previous HDL lab   Cannot find a previous total cholesterol lab    Depression screen [facility] 2/9 06/25/2020 06/20/2019  Decreased Interest 0 0  Down, Depressed, Hopeless 1 0  PHQ - 2 Score 1 0  Some recent data might be hidden    Social History   Tobacco Use  Smoking Status Every Day   Packs/day: 1.00   Years: 42.75   Pack years: 42.75   Types: Cigarettes  Smokeless Tobacco Never   BP Readings from Last 3 Encounters:  06/02/21 (!) 154/91  04/21/21 (!) 148/94  04/03/21 134/86   Pulse Readings from Last 3 Encounters:  06/02/21 77  04/21/21 65   04/03/21 73   Wt Readings from Last 3 Encounters:  06/02/21 94 lb 6.4 oz (42.8 kg)  05/29/21 94 lb (42.6 kg)  04/21/21 94 lb 2.9 oz (42.7 kg)   BMI Readings from Last 3 Encounters:  06/02/21 18.44 kg/m  05/29/21 18.36 kg/m  04/21/21 18.39 kg/m    Assessment/Interventions: Review of patient past medical history, allergies, medications, health status, including review of consultants reports, laboratory and other test data, was performed as part of comprehensive evaluation and provision of chronic care management services.   SDOH:  (Social Determinants of Health) assessments and interventions performed: Yes SDOH Interventions    Flowsheet Row Most Recent Value  SDOH Interventions   Financial Strain Interventions Intervention Not Indicated      SDOH Screenings   Alcohol Screen: Not on file  Depression (PHQ2-9): Low Risk    PHQ-2 Score: 1  Financial  Resource Strain: Low Risk    Difficulty of Paying Living Expenses: Not very hard  Food Insecurity: Not on file  Housing: Not on file  Physical Activity: Not on file  Social Connections: Not on file  Stress: Not on file  Tobacco Use: High Risk   Smoking Tobacco Use: Every Day   Smokeless Tobacco Use: Never  Transportation Needs: Not on file    [place]  Allergies  Allergen Reactions   Carbamazepine Hives   Divalproex Sodium Swelling and Other (See Comments)    HAIR FALLS OUT   Clonazepam Other (See Comments)    HALLUCINATIONS   Diphenhydramine Hcl Rash   Tiotropium Bromide Monohydrate Other (See Comments)    CREATES YEAST IN THROAT   Vicodin [Hydrocodone-Acetaminophen] Itching    Medications Reviewed Today     Reviewed by Debbora Dus, [facility] (Pharmacist) on 06/10/21 at [place] List Status: <None>   Medication Order Taking? Sig Documenting Provider Last Dose Status Informant  albuterol (VENTOLIN HFA) 108 (90 Base) MCG/ACT inhaler [id number] Yes INHALE 2 PUFFS BY MOUTH EVERY 6 HOURS ASNEEDED WHEEEZING/  SHORTNESS OF BREATH Venia Carbon, MD Taking Active   ALPRAZolam Duanne Moron) 1 MG tablet [id number] Yes TAKE 1/2 TO 1 TABLET BY MOUTH 3 TIMES DAILY AS NEEDED ANXIETY Venia Carbon, MD Taking Active   fluticasone (FLONASE) 50 MCG/ACT nasal spray [id number] Yes Place 2 sprays into both nostrils daily. [provider] Taking Active   lamoTRIgine (LAMICTAL) 100 MG tablet [id number] Yes Take 1 tablet (100 mg total) by mouth 2 (two) times daily. Venia Carbon, MD Taking Active   losartan-hydrochlorothiazide [facility]) 100-12.5 MG tablet [id number] Yes TAKE 1 TABLET BY MOUTH ONCE DAILY Venia Carbon, MD Taking Active   ondansetron (ZOFRAN-ODT) 4 MG disintegrating tablet [id number] Yes DISSOLVE 1 TABLET ON THE TONGUE EVERY 8 HOURS AS NEEDED FOR NAUSEA OR VOMIT Venia Carbon, MD Taking Active   Pancrelipase, Lip-Prot-Amyl, (CREON) 32951-88416 units CPEP [id number] Yes Take by mouth. 2 capsules with each meal and 1 capsule with snack [provider] Taking Active   pantoprazole (PROTONIX) 40 MG tablet [id number] Yes Take 1 tablet (40 mg total) by mouth 2 (two) times daily before a meal. [name] B, MD Taking Active   PARoxetine (PAXIL) 20 MG tablet [id number] Yes TAKE 1 TABLET BY MOUTH ONCE DAILY Venia Carbon, MD Taking Active   Tiotropium Bromide Monohydrate (SPIRIVA RESPIMAT) 2.5 MCG/ACT AERS [id number] No Inhale 2 puffs into the lungs daily for 1 day.  Patient not taking: No sig reported   Venia Carbon, MD Not Taking Expired 01/09/21 2359   tiZANidine (ZANAFLEX) 2 MG tablet [id number] Yes TAKE 1-2 TABLETS BY MOUTH EVERY 6 HOURS AS NEEDED FOR MUSCLE SPASMS Venia Carbon, MD Taking Active   Med List Note Dewayne Shorter, RN 01/29/16 1519): UDS 01-29-16            Patient Active Problem List   Diagnosis Date Noted   Generalized abdominal pain    Acute gastric ulcer without hemorrhage or perforation    Acute gastric erosion    History of colonic polyps     Malnutrition of moderate degree ([place]) 06/25/2020   Hypokalemia 05/02/2020   History of gastric ulcer    Chronic nausea 02/20/2020   Goals of care, counseling/discussion 02/20/2020   Fatigue 01/30/2020   Personal history of colonic polyps    Diverticulosis of large intestine without diverticulitis    Pulmonary nodules 08/08/2018  B12 deficiency 07/21/2018   Weight loss 07/21/2018   Iron deficiency anemia due to chronic blood loss 07/05/2018   Polyp of colon    Acute peptic ulcer of stomach    Acute posthemorrhagic anemia    Advance directive discussed with patient 10/27/2017   Neuropathy 08/11/2016   Vitamin D deficiency 03/15/2016   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   Hx of cervical spine surgery (Left ACDF) 02/02/2016   Chronic pain 01/29/2016   Marijuana use 01/29/2016   Osteoarthritis of shoulder (Left) 07/05/2015   S/P Bilateral Total Shoulder Replacement (2016) 11/23/2014   Osteoarthritis of shoulder (Right) 04/13/2014   Routine general medical examination at a health care facility 04/05/2013   CAROTID BRUIT, LEFT 01/02/2011   Restless legs syndrome (RLS) 10/21/2010   Essential hypertension, benign 06/23/2010   HYPERLIPIDEMIA-MIXED 10/23/2009   Valvular heart disease 03/27/2009   IRRITABLE BOWEL SYNDROME 11/13/2008   Mood disorder ([place]) 09/07/2007   Tobacco use disorder 09/07/2007   GLAUCOMA 09/07/2007   HEMORRHOIDS 09/07/2007   Esophagogastric ulcer 09/07/2007   COPD (chronic obstructive pulmonary disease) with chronic bronchitis ([place]) 08/08/2007   GERD 08/08/2007   Seizure disorder ([place]) 08/08/2007    Immunization History  Administered Date(s) Administered   Influenza Split 09/28/2011   Influenza Whole 10/03/2007, 09/20/2009, 10/21/2010   Influenza,inj,Quad PF,6+ Mos 08/11/2016, 10/27/2017   Moderna Sars-Covid-2 Vaccination 08/23/2020   Pneumococcal Conjugate-13 10/27/2017   Pneumococcal Polysaccharide-23 10/09/2009    Td 01/09/2003    Conditions to be addressed/monitored:  Hypertension, Hyperlipidemia, GERD, COPD, Anxiety, Osteoarthritis, Tobacco use, and Iron deficiency  Care Plan : [place]  Updates made by Debbora Dus, [facility] since 06/10/2021 12:00 AM     Problem: Disease Management   Priority: High  Onset Date: 06/10/2021  Note:   Current Barriers:  Ran out of Spiriva samples Due for lipid panel update  Pharmacist Clinical Goal(s):  Patient will contact provider office for questions/concerns as evidenced notation of same in electronic health record through collaboration with PharmD and provider.   Interventions: 1:1 collaboration with Venia Carbon, MD regarding development and update of comprehensive plan of care as evidenced by provider attestation and co-signature Inter-disciplinary care team collaboration (see longitudinal plan of care) Comprehensive medication review performed; medication list updated in electronic medical record  Hypertension  (BP goal <140/90) -Query Controlled - last 2 clinic readings elevated, pt reports it fluctuates at home, but is using a fingertip monitor. Unsure how accurate this can be. -Current treatment: Losartan-HCTZ 100- 12.5 mg - 1 tablet daily -Medications previously tried: none  -Current home readings: checks BP a couple weeks ago, when not feeling well - 130/70s, sometimes higher  Reports BP monitor checks by finger tip.  -Exercise - limited due to COPD, back pain. She tries to work out on the [employer] at night.  -Denies hypotensive/hypertensive symptoms -Educated on BP goals and benefits of medications for prevention of heart attack, stroke and kidney damage; -If BP elevated at office visit with PCP this month, we will recommend a home BP monitor with arm cuff. Until then, would avoid using the finger monitor due to unverified accuracy. -Recommended to continue current medication  Hyperlipidemia: (LDL goal < 100) -Unable to  assess, last lipid panel 2015 and patient was on simvastatin at this time. Reason for d/c not documented. -Current treatment: None -Medications previously tried:  simvastatin 20 mg - no adverse effects documented -Recommended update lipid panel at [facility]  COPD (Goal:  control symptoms and prevent exacerbations) -Not ideally controlled -Initial pulmonology consult completed 08/23/20, Spiriva started. Pt completed PFTs but has not follow up since then. PFTs considered normal. She ran out of Spiriva samples and has not taken in a few months. She has not tolerated Spiriva DPI in the past. -She has a hard time breathing with the heat. She has her husband run errands for her. She runs out breath sweeping/mopping. She uses albuterol multiple times most days. No maintenance inhaler. Coughs a lot at night. -Current smoker - 3/4 PPD -Quit history -she quit for 15 months with nicotine patches/inhaler, went back to work and coworkers smoked. -Current treatment  She was on Spiriva samples, ran out Albuterol - PRN -Medications previously tried: Advair, Symbicort - unknown   -Gold Grade: Gold 1 (FEV1>80%) -Current COPD Classification:  B (high sx, <2 exacerbations/yr) -MMRC/CAT score: none -Pulmonary function testing: 09/2020 -Exacerbations requiring treatment in last 6 months: none -Patient denies consistent use of maintenance inhaler -Frequency of rescue inhaler use: she has not used in past 2 days but uses most days -Counseled on Benefits of consistent maintenance inhaler use -Recommended to continue current medication; Schedule f/u with pulmonology. Will see about resuming Spiriva today.  Depression/Anxiety (Goal: Improve mood) -Controlled - per patient report -Current treatment: Paroxetine 20 mg - 1 tablet daily Xanax 1 mg - 1/2-1 tablet TID PRN (pt takes 1 tablet BID) -Medications previously tried/failed: none -PHQ9: 2 (2019) -Family stressors but overall stable, doing well on current  therapy -Recommended to continue current medication  Osteoarthritis (Goal: Improve pain level) -Not ideally controlled - per patient report -She reports h/o both shoulders replaced, knee surgery, 3 back surgeries and ruptured disc -She has stopped NSAIDs due to PUD. -Current treatment  Voltaren gel 1% - apply PRN (used twice so far) Tylenol Arthritis Strength - as needed (reports ineffective) -Medications previously tried: multiple -We discussed scheduling Tylenol and rotating with Voltaren gel.  -Recommended to continue current medication; Try scheduling Tylenol 500 mg-1000 mg TID. Use Voltaren gel 2-4 times daily in between use of Tylenol.  Seizures (Goal: Seizure free) -Controlled - pt reports seizure free about 15 years -Does not drive much, but able -Current treatment  Lamotrigine 100 mg - 1 tablet twice daily -Medications previously tried: yes, but unknown  -Recommended to continue current medication  Iron Deficiency (Goal: Iron panel WNL) -Managed by hematology/oncology -She takes oral iron and gets infusions -Current treatment  Iron 65 mg - 1 tablet every day -Medications previously tried: none reported  -Recommended to continue current medication  Other -  B12 injections monthly  Protonix BID for PUD Creon - 2 before meals,1  before snacks PRN - Tizanidine, Zofran She is out of her eye drops for glaucoma  Patient Goals/Self-Care Activities Patient will:  - take medications as prescribed  Follow Up Plan: Telephone follow up appointment with care management team member scheduled for:  3 months     Medication Assistance: None required.  Patient affirms current coverage meets needs.  Compliance/Adherence/Medication fill history: Care Gaps: Vaccines - Shingrix, Tdap, Flu, Pneumococcal  Star-Rating Drugs: Medication:                Last Fill:         Day Supply Losartan 100-12.73m 01/03/21                        90  Losartan past due for refill - pt denies  missed doses  Patient's preferred  pharmacy is: [facility], [place] [address] Phone: [phone] Fax: [phone]  Uses pill box? No - denies need for it Pt endorses 100% compliance  We discussed: Benefits of medication synchronization, packaging and delivery as well as enhanced pharmacist oversight with [employer]. Patient decided to: Continue current medication management strategy  Care Plan and Follow Up Patient Decision:  Patient agrees to Care Plan and Follow-up.  Debbora Dus, PharmD Clinical Pharmacist [facility] [phone]

## 2021-06-10 NOTE — Telephone Encounter (Signed)
Rx sent electronically.  

## 2021-06-10 NOTE — Patient Instructions (Addendum)
June 10, 2021  Dear Molly Peters,  It was a pleasure meeting you during our initial appointment on June 10, 2021. Below is a summary of the goals we discussed and components of chronic care management. Please contact me anytime with questions or concerns.   Visit Information  Patient Care Plan: CCM Pharmacy Care Plan     Problem Identified: Disease Management   Priority: High  Onset Date: 06/10/2021  Note:   Current Barriers:  Ran out of Spiriva samples Due for lipid panel update  Pharmacist Clinical Goal(s):  Patient will contact provider office for questions/concerns as evidenced notation of same in electronic health record through collaboration with PharmD and provider.   Interventions: 1:1 collaboration with Venia Carbon, MD regarding development and update of comprehensive plan of care as evidenced by provider attestation and co-signature Inter-disciplinary care team collaboration (see longitudinal plan of care) Comprehensive medication review performed; medication list updated in electronic medical record  Hypertension  (BP goal <140/90) -Query Controlled - last 2 clinic readings elevated, pt reports it fluctuates at home, but is using a fingertip monitor. Unsure how accurate this can be. -Current treatment: Losartan-HCTZ 100- 12.5 mg - 1 tablet daily -Medications previously tried: none  -Current home readings: checks BP a couple weeks ago, when not feeling well - 130/70s, sometimes higher  Reports BP monitor checks by finger tip.  -Exercise - limited due to COPD, back pain. She tries to work out on the [employer] at night.  -Denies hypotensive/hypertensive symptoms -Educated on BP goals and benefits of medications for prevention of heart attack, stroke and kidney damage; -If BP elevated at office visit with PCP this month, we will recommend a home BP monitor with arm cuff. Until then, would avoid using the finger monitor due to unverified accuracy. -Recommended to  continue current medication  Hyperlipidemia: (LDL goal < 100) -Unable to assess, last lipid panel 2015 and patient was on simvastatin at this time. Reason for d/c not documented. -Current treatment: None -Medications previously tried:  simvastatin 20 mg - no adverse effects documented -Recommended update lipid panel at [facility]  COPD (Goal: control symptoms and prevent exacerbations) -Not ideally controlled -Initial pulmonology consult completed 08/23/20, Spiriva started. Pt completed PFTs but has not follow up since then. PFTs considered normal. She ran out of Spiriva samples and has not taken in a few months. She has not tolerated Spiriva DPI in the past. -She has a hard time breathing with the heat. She has her husband run errands for her. She runs out breath sweeping/mopping. She uses albuterol multiple times most days. No maintenance inhaler. Coughs a lot at night. -Current smoker - 3/4 PPD -Quit history -she quit for 15 months with nicotine patches/inhaler, went back to work and coworkers smoked. -Current treatment  She was on Spiriva samples, ran out Albuterol - PRN -Medications previously tried: Advair, Symbicort - unknown   -Gold Grade: Gold 1 (FEV1>80%) -Current COPD Classification:  B (high sx, <2 exacerbations/yr) -MMRC/CAT score: none -Pulmonary function testing: 09/2020 -Exacerbations requiring treatment in last 6 months: none -Patient denies consistent use of maintenance inhaler -Frequency of rescue inhaler use: she has not used in past 2 days but uses most days -Counseled on Benefits of consistent maintenance inhaler use -Recommended to continue current medication; Schedule f/u with pulmonology. Will see about resuming Spiriva today.  Depression/Anxiety (Goal: Improve mood) -Controlled - per patient report -Current treatment: Paroxetine 20 mg - 1 tablet daily Xanax 1 mg - 1/2-1 tablet TID PRN (pt  takes 1 tablet BID) -Medications previously tried/failed: none -PHQ9: 2  (2019) -Family stressors but overall stable, doing well on current therapy -Recommended to continue current medication  Osteoarthritis (Goal: Improve pain level) -Not ideally controlled - per patient report -She reports h/o both shoulders replaced, knee surgery, 3 back surgeries and ruptured disc -She has stopped NSAIDs due to PUD. -Current treatment  Voltaren gel 1% - apply PRN (used twice so far) Tylenol Arthritis Strength - as needed (reports ineffective) -Medications previously tried: multiple -We discussed scheduling Tylenol and rotating with Voltaren gel.  -Recommended to continue current medication; Try scheduling Tylenol 500 mg-1000 mg TID. Use Voltaren gel 2-4 times daily in between use of Tylenol.  Seizures (Goal: Seizure free) -Controlled - pt reports seizure free about 15 years -Does not drive much, but able -Current treatment  Lamotrigine 100 mg - 1 tablet twice daily -Medications previously tried: yes, but unknown  -Recommended to continue current medication  Iron Deficiency (Goal: Iron panel WNL) -Managed by hematology/oncology -She takes oral iron and gets infusions -Current treatment  Iron 65 mg - 1 tablet every day -Medications previously tried: none reported  -Recommended to continue current medication  Other -  B12 injections monthly  Protonix BID for PUD Creon - 2 before meals,1  before snacks PRN - Tizanidine, Zofran She is out of her eye drops for glaucoma  Patient Goals/Self-Care Activities Patient will:  - take medications as prescribed  Follow Up Plan: Telephone follow up appointment with care management team member scheduled for:  3 months     Molly Peters was given information about Chronic Care Management services today including:  CCM service includes personalized support from designated clinical staff supervised by her physician, including individualized plan of care and coordination with other care providers 24/7 contact phone numbers  for assistance for urgent and routine care needs. Standard insurance, coinsurance, copays and deductibles apply for chronic care management only during months in which we provide at least 20 minutes of these services. Most insurances cover these services at 100%, however patients may be responsible for any copay, coinsurance and/or deductible if applicable. This service may help you avoid the need for more expensive face-to-face services. Only one practitioner may furnish and bill the service in a calendar month. The patient may stop CCM services at any time (effective at the end of the month) by phone call to the office staff.  Patient agreed to services and verbal consent obtained.   Patient verbalizes understanding of instructions provided today and agrees to view in [place].   Debbora Dus, PharmD Clinical Pharmacist [facility] at [facility] [phone]

## 2021-06-10 NOTE — Telephone Encounter (Signed)
Patient ran out of Spiriva samples, could we send a refill to her pharmacy? She has not seen pulmonology since 11/22. Follow up was recommended PRN. Encouraged her to schedule f/u. PCP visit scheduled this month.  Debbora Dus, PharmD Clinical Pharmacist [facility] at [facility] [phone]

## 2021-06-10 NOTE — Telephone Encounter (Signed)
Patient would like contact info updated. Primary phone (home) is: [phone]  Thanks,  Debbora Dus, PharmD Clinical Pharmacist [facility] at [facility] [phone]

## 2021-06-30 ENCOUNTER — Other Ambulatory Visit: Payer: Self-pay

## 2021-06-30 ENCOUNTER — Encounter: Payer: Self-pay | Admitting: Internal Medicine

## 2021-06-30 ENCOUNTER — Ambulatory Visit (INDEPENDENT_AMBULATORY_CARE_PROVIDER_SITE_OTHER): Payer: Medicare HMO | Admitting: Internal Medicine

## 2021-06-30 VITALS — BP 128/82 | HR 68 | Temp 97.7°F | Ht 59.5 in | Wt 93.8 lb

## 2021-06-30 DIAGNOSIS — I10 Essential (primary) hypertension: Secondary | ICD-10-CM

## 2021-06-30 DIAGNOSIS — Z0000 Encounter for general adult medical examination without abnormal findings: Secondary | ICD-10-CM | POA: Diagnosis not present

## 2021-06-30 DIAGNOSIS — F1320 Sedative, hypnotic or anxiolytic dependence, uncomplicated: Secondary | ICD-10-CM | POA: Insufficient documentation

## 2021-06-30 DIAGNOSIS — E441 Mild protein-calorie malnutrition: Secondary | ICD-10-CM | POA: Diagnosis not present

## 2021-06-30 DIAGNOSIS — G40909 Epilepsy, unspecified, not intractable, without status epilepticus: Secondary | ICD-10-CM | POA: Diagnosis not present

## 2021-06-30 DIAGNOSIS — J449 Chronic obstructive pulmonary disease, unspecified: Secondary | ICD-10-CM

## 2021-06-30 DIAGNOSIS — F39 Unspecified mood [affective] disorder: Secondary | ICD-10-CM | POA: Diagnosis not present

## 2021-06-30 MED ORDER — LAMOTRIGINE 100 MG PO TABS: 100.0000 mg | ORAL_TABLET | Freq: Two times a day (BID) | ORAL | 3 refills | Status: DC

## 2021-06-30 MED ORDER — PAROXETINE HCL 20 MG PO TABS: 20.0000 mg | ORAL_TABLET | Freq: Every day | ORAL | 3 refills | Status: DC

## 2021-06-30 MED ORDER — ALPRAZOLAM 1 MG PO TABS: ORAL_TABLET | ORAL | 0 refills | Status: DC

## 2021-06-30 NOTE — Assessment & Plan Note (Signed)
None on lamictal

## 2021-06-30 NOTE — Assessment & Plan Note (Signed)
BP Readings from Last 3 Encounters:  06/30/21 128/82  06/02/21 (!) 154/91  04/21/21 (!) 148/94   Okay on losartan HCTZ Will check labs

## 2021-06-30 NOTE — Progress Notes (Signed)
Subjective:    Patient ID: Molly Peters, female    DOB: 19-Oct-1961, 60 y.o.   MRN: [id number]  HPI Here for Medicare wellness visit and follow up of chronic health conditions This visit occurred during the SARS-CoV-2 public health emergency.  Safety protocols were in place, including screening questions prior to the visit, additional usage of staff PPE, and extensive cleaning of exam room while observing appropriate contact time as indicated for disinfecting solutions.   Reviewed form and advanced directives Reviewed other doctors Still smoking ---not ready to quit Rare glass of wine Vision is off some----needs new prescription Walks some with the dog Hearing is not great --not ready for aides 2 falls--both due to the dog. No injuries Does much of the housework--has to pace herself. Husband does the shopping Mild stable memory issues  Ongoing Rx for ulcer On pantoprazole and probiotic Continues on creon for pancreatic replacement  Breathing is about the same Will have more trouble--like in [facility] Regular cough No regular wheezing  One visit with pulmonary---has albuterol for prn. One month of spiriva---some help but then couldn't afford it  Ongoing anxiety Uses the paroxetine and xanax No regular depression or anhedonia  Takes tylenol for the chronic back pain Will still flare at times---also shoulders  No chest pain Gets palpitations if she gets tense No dizziness or syncope  Appetite is "fair" Does well for supper only Weight is stable--but still low  No seizures on the lamictal  Current Outpatient Medications on File Prior to Visit  Medication Sig Dispense Refill   albuterol (VENTOLIN HFA) 108 (90 Base) MCG/ACT inhaler INHALE 2 PUFFS BY MOUTH EVERY 6 HOURS ASNEEDED WHEEEZING/ SHORTNESS OF BREATH 8.5 g 1   ALPRAZolam (XANAX) 1 MG tablet TAKE 1/2 TO 1 TABLET BY MOUTH 3 TIMES DAILY AS NEEDED ANXIETY 90 tablet 0   CREON 24000-76000 units CPEP TAKE 2 CAPSULES  BY MOUTH 3 TIMES A DAY AND 1 CAPSULE WITH SNACKS 810 capsule 0   fluticasone (FLONASE) 50 MCG/ACT nasal spray Place 2 sprays into both nostrils daily.     lamoTRIgine (LAMICTAL) 100 MG tablet Take 1 tablet (100 mg total) by mouth 2 (two) times daily. 180 tablet 3   losartan-hydrochlorothiazide (HYZAAR) 100-12.5 MG tablet TAKE 1 TABLET BY MOUTH ONCE DAILY 90 tablet 3   ondansetron (ZOFRAN-ODT) 4 MG disintegrating tablet DISSOLVE 1 TABLET ON THE TONGUE EVERY 8 HOURS AS NEEDED FOR NAUSEA OR VOMIT 30 tablet 0   pantoprazole (PROTONIX) 40 MG tablet Take 1 tablet (40 mg total) by mouth 2 (two) times daily before a meal. 180 tablet 0   PARoxetine (PAXIL) 20 MG tablet TAKE 1 TABLET BY MOUTH ONCE DAILY 90 tablet 0   tiZANidine (ZANAFLEX) 2 MG tablet TAKE 1-2 TABLETS BY MOUTH EVERY 6 HOURS AS NEEDED FOR MUSCLE SPASMS 30 tablet 0   No current facility-administered medications on file prior to visit.    Allergies  Allergen Reactions   Carbamazepine Hives   Divalproex Sodium Swelling and Other (See Comments)    HAIR FALLS OUT   Clonazepam Other (See Comments)    HALLUCINATIONS   Diphenhydramine Hcl Rash   Tiotropium Bromide Monohydrate Other (See Comments)    CREATES YEAST IN THROAT   Vicodin [Hydrocodone-Acetaminophen] Itching    Past Medical History:  Diagnosis Date   Anxiety    Cervical cancer ([place]) 1980's   Chronic back pain 11/12/2009   Qualifier: Diagnosis of  By: Maxie Better FNP, Rosalita Levan    COPD (chronic  obstructive pulmonary disease) (HCC)    states SOB with ADLs; no O2 use; able to speak in complete sentences without SOB(11/15/2014)   Depression    Difficulty swallowing pills    s/p cervical fusion   Family history of adverse reaction to anesthesia    pt's mother and sister have hx. of post-op N/V   Fibromyalgia    GERD (gastroesophageal reflux disease)    Heart murmur    History of gastric ulcer    Hypertension    states under control with med., has been on med. x 1-2 yr.    IBS (irritable bowel syndrome)    Internal hemorrhoid    states has had intermittent bright red bleeding with BM (11/15/2014)   Intraductal papilloma of breast, right 08/08/2018   Limited joint range of motion    neck - s/p cervical fusion   Localized primary osteoarthritis of right shoulder region 11/23/2014   Osteoarthritis of left shoulder 07/05/2015   Osteoarthritis of right shoulder 11/2014   Pneumonia    Seizures ([place])    last seizure 2010   Short-term memory loss    TMJ syndrome    Valvular heart disease    Initial workup a number of years ago at [facility].  Echo (10/2009) showed EF 60-65%,      normal LV size, moderate aortic insufficiency with a trileaflet aortic valve and normal aortic root size.  Mild      mitral regurgitation.    Wears dentures    lower    Past Surgical History:  Procedure Laterality Date   ABDOMINAL HYSTERECTOMY     partial   ANTERIOR CERVICAL DECOMP/DISCECTOMY FUSION  02/06/2011   C4-5, C5-6, C6-7   BREAST BIOPSY Bilateral 10+ yrs ago   neg   BREAST BIOPSY Right 08/03/2018   2 areas bx, -neg   BREAST LUMPECTOMY Bilateral 1989   x 3 - benign   BREAST LUMPECTOMY Right 08/24/2018   Procedure: BREAST LUMPECTOMY WITH ULTRASOUND IN OR;  Surgeon: Vickie Epley, MD;  Location: [facility] ORS;  Service: General;  Laterality: Right;   [place]     x 2   COLONOSCOPY  09/28/2008   COLONOSCOPY N/A 06/15/2018   Procedure: COLONOSCOPY;  Surgeon: Virgel Manifold, MD;  Location: [facility] ENDOSCOPY;  Service: Endoscopy;  Laterality: N/A;   COLONOSCOPY WITH PROPOFOL N/A 05/04/2019   Procedure: COLONOSCOPY WITH PROPOFOL;  Surgeon: Virgel Manifold, MD;  Location: [facility] ENDOSCOPY;  Service: Endoscopy;  Laterality: N/A;   COLONOSCOPY WITH PROPOFOL N/A 04/21/2021   Procedure: COLONOSCOPY WITH PROPOFOL;  Surgeon: Virgel Manifold, MD;  Location: [facility] ENDOSCOPY;  Service: Endoscopy;  Laterality: N/A;   ESOPHAGOGASTRODUODENOSCOPY   09/28/2008   ESOPHAGOGASTRODUODENOSCOPY N/A 06/14/2018   Procedure: ESOPHAGOGASTRODUODENOSCOPY (EGD);  Surgeon: Virgel Manifold, MD;  Location[facility] ENDOSCOPY;  Service: Endoscopy;  Laterality: N/A;   ESOPHAGOGASTRODUODENOSCOPY (EGD) WITH PROPOFOL N/A 05/04/2019   Procedure: ESOPHAGOGASTRODUODENOSCOPY (EGD) WITH PROPOFOL;  Surgeon: Virgel Manifold, MD;  Location: [facility] ENDOSCOPY;  Service: Endoscopy;  Laterality: N/A;   ESOPHAGOGASTRODUODENOSCOPY (EGD) WITH PROPOFOL N/A 03/18/2020   Procedure: ESOPHAGOGASTRODUODENOSCOPY (EGD) WITH PROPOFOL;  Surgeon: Lin Landsman, MD;  Location: [facility] ENDOSCOPY;  Service: Gastroenterology;  Laterality: N/A;   ESOPHAGOGASTRODUODENOSCOPY (EGD) WITH PROPOFOL N/A 04/21/2021   Procedure: ESOPHAGOGASTRODUODENOSCOPY (EGD) WITH PROPOFOL;  Surgeon: Virgel Manifold, MD;  Location: [facility] ENDOSCOPY;  Service: Endoscopy;  Laterality: N/A;   HARDWARE REMOVAL  11/17/2006   L5-S1   LAMINECTOMY WITH POSTERIOR LATERAL  ARTHRODESIS LEVEL 3  12/06/2009   with synovial cyst resection L3-4 bilat.   LAMINECTOMY WITH POSTERIOR LATERAL ARTHRODESIS LEVEL 3  11/17/2006   L4-5   NM MYOVIEW LTD     Lexiscan myoview (10/2009): EF 77%, normal wall motion, normal perfusion.    SHOULDER ARTHROSCOPY WITH DEBRIDEMENT AND BICEP TENDON REPAIR Right 04/13/2014   Procedure: RIGHT SHOULDER ARTHROSCOPY WITH DEBRIDEMENT EXTENSIVE;  Surgeon: Johnny Bridge, MD;  Location: [place];  Service: Orthopedics;  Laterality: Right;   TOTAL SHOULDER ARTHROPLASTY Right 11/23/2014   Procedure: TOTAL RIGHT SHOULDER ARTHROPLASTY;  Surgeon: Johnny Bridge, MD;  Location: [place];  Service: Orthopedics;  Laterality: Right;   TOTAL SHOULDER ARTHROPLASTY Left 07/05/2015   Procedure: LEFT TOTAL SHOULDER REPLACEMENT;  Surgeon: Marchia Bond, MD;  Location: [place];  Service: Orthopedics;  Laterality: Left;    Family History  Problem Relation Age of Onset    Cervical cancer Mother    Anesthesia problems Mother        post-op N/V   Rheum arthritis Mother    Anxiety disorder Mother    Cancer Father        colon cancer   Migraines Sister    Cervical cancer Sister    Anesthesia problems Sister        post-op N/V   Migraines Brother    Asthma Daughter    Cerebral palsy Daughter        age 72   Coronary artery disease Maternal Grandmother    Hypertension Maternal Grandmother    Diabetes Maternal Grandmother    Coronary artery disease Paternal Grandmother    Hypertension Paternal Grandmother    Diabetes Paternal Grandmother    Breast cancer Maternal Aunt    Breast cancer Maternal Aunt    Breast cancer Maternal Aunt    Colon cancer Paternal Uncle    Lung cancer Paternal Uncle     Social History   Socioeconomic History   Marital status: Married    Spouse name: Not on file   Number of children: 3   Years of education: Not on file   Highest education level: Not on file  Occupational History   Occupation: Disabled 2003  Tobacco Use   Smoking status: Every Day    Packs/day: 1.00    Years: 42.75    Pack years: 42.75    Types: Cigarettes   Smokeless tobacco: Never  Vaping Use   Vaping Use: Never used  Substance and Sexual Activity   Alcohol use: Yes    Alcohol/week: 0.0 standard drinks    Comment: rare   Drug use: Yes    Types: Marijuana    Comment: marijuana 2 days ago   Sexual activity: Not on file  Other Topics Concern   Not on file  Social History Narrative   No living will   Husband, then oldest son Roosvelt Harps, should make decisions   Requests DNR--done 06/25/20   No tube feeds if cognitively unaware   Social Determinants of Health   Financial Resource Strain: Low Risk    Difficulty of Paying Living Expenses: Not very hard  Food Insecurity: Not on file  Transportation Needs: Not on file  Physical Activity: Not on file  Stress: Not on file  Social Connections: Not on file  Intimate Partner Violence: Not on  file   Review of Systems Usually sleeps okay Wears seat belt Lower plate. Keeps up with dentist No suspicious skin lesions Occasional heartburn--spicy food.  No dysphagia Bowels are fine---no blood No dysuria or hematuria    Objective:   Physical Exam Constitutional:      Appearance: Normal appearance.  HENT:     Mouth/Throat:     Comments: No lesions Eyes:     Conjunctiva/sclera: Conjunctivae normal.     Pupils: Pupils are equal, round, and reactive to light.  Cardiovascular:     Rate and Rhythm: Normal rate and regular rhythm.     Pulses: Normal pulses.     Heart sounds:    No gallop.     Comments: Soft aortic systolic murmur Pulmonary:     Effort: Pulmonary effort is normal.     Breath sounds: No wheezing or rales.     Comments: Decreased breath sounds but clear Abdominal:     Palpations: Abdomen is soft.     Tenderness: There is no abdominal tenderness.  Musculoskeletal:     Cervical back: Neck supple.     Right lower leg: No edema.     Left lower leg: No edema.  Lymphadenopathy:     Cervical: No cervical adenopathy.  Skin:    General: Skin is warm.     Findings: No rash.  Neurological:     Mental Status: She is alert and oriented to person, place, and time.     Comments: President---- "Edmon Crape, Trump, Bush----Obama" [phone] D-l-? Recall 3/3  Psychiatric:        Mood and Affect: Mood normal.        Behavior: Behavior normal.           Assessment & Plan:

## 2021-06-30 NOTE — Assessment & Plan Note (Signed)
Still underweight but stable Add ensure/boost for skipped meals

## 2021-06-30 NOTE — Assessment & Plan Note (Signed)
Has albuterol for prn Asked her to look into getting spiriva with new Medicare season

## 2021-06-30 NOTE — Assessment & Plan Note (Signed)
PDMP reviewed No concerns 

## 2021-06-30 NOTE — Assessment & Plan Note (Signed)
Chronic anxiety On paroxetine and regular xanax

## 2021-06-30 NOTE — Progress Notes (Signed)
Hearing Screening  Method: Audiometry   '500Hz'$  '1000Hz'$  '2000Hz'$  '4000Hz'$   Right ear 0 '25 25 20  '$ Left ear 0 '20 20 20   '$ Vision Screening   Right eye Left eye Both eyes  Without correction     With correction 20/50 20/50 20/40

## 2021-06-30 NOTE — Assessment & Plan Note (Signed)
I have personally reviewed the Medicare Annual Wellness questionnaire and have noted 1. The patient's medical and social history 2. Their use of alcohol, tobacco or illicit drugs 3. Their current medications and supplements 4. The patient's functional ability including ADL's, fall risks, home safety risks and hearing or visual             impairment. 5. Diet and physical activities 6. Evidence for depression or mood disorders  The patients weight, height, BMI and visual acuity have been recorded in the chart I have made referrals, counseling and provided education to the patient based review of the above and I have provided the pt with a written personalized care plan for preventive services.  I have provided you with a copy of your personalized plan for preventive services. Please take the time to review along with your updated medication list.  Colonoscopy due 2027 Mammogram due now No pap due the hyster Needs to stop smoking but won't  Does lung cancer screening Had 1 COVID vaccine--urged getting another---and flu vaccine

## 2021-07-01 ENCOUNTER — Inpatient Hospital Stay: Payer: Medicare HMO | Attending: Oncology

## 2021-07-01 DIAGNOSIS — D500 Iron deficiency anemia secondary to blood loss (chronic): Secondary | ICD-10-CM

## 2021-07-01 DIAGNOSIS — D509 Iron deficiency anemia, unspecified: Secondary | ICD-10-CM | POA: Diagnosis not present

## 2021-07-01 DIAGNOSIS — E538 Deficiency of other specified B group vitamins: Secondary | ICD-10-CM | POA: Diagnosis not present

## 2021-07-01 LAB — COMPREHENSIVE METABOLIC PANEL
ALT: 8 U/L (ref 0–35)
AST: 18 U/L (ref 0–37)
Albumin: 4.2 g/dL (ref 3.5–5.2)
Alkaline Phosphatase: 74 U/L (ref 39–117)
BUN: 12 mg/dL (ref 6–23)
CO2: 33 mEq/L — ABNORMAL HIGH (ref 19–32)
Calcium: 10 mg/dL (ref 8.4–10.5)
Chloride: 99 mEq/L (ref 96–112)
Creatinine, Ser: 0.89 mg/dL (ref 0.40–1.20)
GFR: 69.9 mL/min (ref >60.00)
Glucose, Bld: 92 mg/dL (ref 70–99)
Potassium: 3.7 mEq/L (ref 3.5–5.1)
Sodium: 140 mEq/L (ref 135–145)
Total Bilirubin: 0.5 mg/dL (ref 0.2–1.2)
Total Protein: 7.1 g/dL (ref 6.0–8.3)

## 2021-07-01 LAB — CBC
HCT: 42.9 % (ref 36.0–46.0)
Hemoglobin: 15.1 g/dL — ABNORMAL HIGH (ref 12.0–15.0)
MCHC: 35.3 g/dL (ref 30.0–36.0)
MCV: 97.2 fl (ref 78.0–100.0)
Platelets: 394 10*3/uL (ref 150.0–400.0)
RBC: 4.41 Mil/uL (ref 3.87–5.11)
RDW: 12.8 % (ref 11.5–15.5)
WBC: 7.5 10*3/uL (ref 4.0–10.5)

## 2021-07-01 LAB — T4, FREE: Free T4: 0.77 ng/dL (ref 0.60–1.60)

## 2021-07-01 MED ORDER — IRON SUCROSE 20 MG/ML IV SOLN: 200.0000 mg | Freq: Once | INTRAVENOUS | Status: DC

## 2021-07-01 MED ORDER — CYANOCOBALAMIN 1000 MCG/ML IJ SOLN
1000.0000 ug | Freq: Once | INTRAMUSCULAR | Status: AC
  Administered 2021-07-01: 1000 ug via INTRAMUSCULAR

## 2021-07-06 DIAGNOSIS — F17210 Nicotine dependence, cigarettes, uncomplicated: Secondary | ICD-10-CM | POA: Diagnosis not present

## 2021-07-06 DIAGNOSIS — M542 Cervicalgia: Secondary | ICD-10-CM | POA: Diagnosis not present

## 2021-07-06 DIAGNOSIS — S161XXA Strain of muscle, fascia and tendon at neck level, initial encounter: Secondary | ICD-10-CM | POA: Diagnosis not present

## 2021-07-06 DIAGNOSIS — Z79899 Other long term (current) drug therapy: Secondary | ICD-10-CM | POA: Diagnosis not present

## 2021-07-06 DIAGNOSIS — F419 Anxiety disorder, unspecified: Secondary | ICD-10-CM | POA: Diagnosis not present

## 2021-07-06 DIAGNOSIS — M25512 Pain in left shoulder: Secondary | ICD-10-CM | POA: Diagnosis not present

## 2021-07-06 DIAGNOSIS — I10 Essential (primary) hypertension: Secondary | ICD-10-CM | POA: Diagnosis not present

## 2021-07-06 DIAGNOSIS — R110 Nausea: Secondary | ICD-10-CM | POA: Diagnosis not present

## 2021-07-12 DIAGNOSIS — Z885 Allergy status to narcotic agent status: Secondary | ICD-10-CM | POA: Diagnosis not present

## 2021-07-12 DIAGNOSIS — F419 Anxiety disorder, unspecified: Secondary | ICD-10-CM | POA: Diagnosis not present

## 2021-07-12 DIAGNOSIS — I10 Essential (primary) hypertension: Secondary | ICD-10-CM | POA: Diagnosis not present

## 2021-07-12 DIAGNOSIS — M25512 Pain in left shoulder: Secondary | ICD-10-CM | POA: Diagnosis not present

## 2021-07-12 DIAGNOSIS — M62838 Other muscle spasm: Secondary | ICD-10-CM | POA: Diagnosis not present

## 2021-07-12 DIAGNOSIS — Z79899 Other long term (current) drug therapy: Secondary | ICD-10-CM | POA: Diagnosis not present

## 2021-07-12 DIAGNOSIS — M47812 Spondylosis without myelopathy or radiculopathy, cervical region: Secondary | ICD-10-CM | POA: Diagnosis not present

## 2021-07-12 DIAGNOSIS — Z981 Arthrodesis status: Secondary | ICD-10-CM | POA: Diagnosis not present

## 2021-07-12 DIAGNOSIS — M542 Cervicalgia: Secondary | ICD-10-CM | POA: Diagnosis not present

## 2021-07-12 DIAGNOSIS — M5031 Other cervical disc degeneration,  high cervical region: Secondary | ICD-10-CM | POA: Diagnosis not present

## 2021-07-12 DIAGNOSIS — S161XXA Strain of muscle, fascia and tendon at neck level, initial encounter: Secondary | ICD-10-CM | POA: Diagnosis not present

## 2021-07-12 DIAGNOSIS — Z888 Allergy status to other drugs, medicaments and biological substances status: Secondary | ICD-10-CM | POA: Diagnosis not present

## 2021-07-30 ENCOUNTER — Ambulatory Visit: Payer: Medicare HMO | Admitting: Oncology

## 2021-07-30 ENCOUNTER — Other Ambulatory Visit: Payer: Medicare HMO

## 2021-07-30 ENCOUNTER — Ambulatory Visit: Payer: Medicare HMO

## 2021-08-05 ENCOUNTER — Other Ambulatory Visit: Payer: Self-pay

## 2021-08-05 DIAGNOSIS — D500 Iron deficiency anemia secondary to blood loss (chronic): Secondary | ICD-10-CM

## 2021-08-06 ENCOUNTER — Inpatient Hospital Stay: Payer: Medicare HMO

## 2021-08-06 ENCOUNTER — Inpatient Hospital Stay (HOSPITAL_BASED_OUTPATIENT_CLINIC_OR_DEPARTMENT_OTHER): Payer: Medicare HMO | Admitting: Oncology

## 2021-08-06 ENCOUNTER — Other Ambulatory Visit: Payer: Self-pay

## 2021-08-06 ENCOUNTER — Inpatient Hospital Stay: Payer: Medicare HMO | Attending: Oncology

## 2021-08-06 VITALS — BP 158/104 | HR 72 | Temp 97.1°F | Resp 14 | Wt 94.4 lb

## 2021-08-06 DIAGNOSIS — Z8261 Family history of arthritis: Secondary | ICD-10-CM | POA: Diagnosis not present

## 2021-08-06 DIAGNOSIS — Z820 Family history of epilepsy and other diseases of the nervous system: Secondary | ICD-10-CM | POA: Diagnosis not present

## 2021-08-06 DIAGNOSIS — D500 Iron deficiency anemia secondary to blood loss (chronic): Secondary | ICD-10-CM

## 2021-08-06 DIAGNOSIS — Z800 Family history of malignant neoplasm of digestive organs: Secondary | ICD-10-CM | POA: Diagnosis not present

## 2021-08-06 DIAGNOSIS — Z66 Do not resuscitate: Secondary | ICD-10-CM | POA: Diagnosis not present

## 2021-08-06 DIAGNOSIS — Z801 Family history of malignant neoplasm of trachea, bronchus and lung: Secondary | ICD-10-CM | POA: Insufficient documentation

## 2021-08-06 DIAGNOSIS — Z8249 Family history of ischemic heart disease and other diseases of the circulatory system: Secondary | ICD-10-CM | POA: Diagnosis not present

## 2021-08-06 DIAGNOSIS — E876 Hypokalemia: Secondary | ICD-10-CM | POA: Diagnosis not present

## 2021-08-06 DIAGNOSIS — I10 Essential (primary) hypertension: Secondary | ICD-10-CM | POA: Diagnosis not present

## 2021-08-06 DIAGNOSIS — J449 Chronic obstructive pulmonary disease, unspecified: Secondary | ICD-10-CM | POA: Diagnosis not present

## 2021-08-06 DIAGNOSIS — Z836 Family history of other diseases of the respiratory system: Secondary | ICD-10-CM | POA: Diagnosis not present

## 2021-08-06 DIAGNOSIS — Z885 Allergy status to narcotic agent status: Secondary | ICD-10-CM | POA: Diagnosis not present

## 2021-08-06 DIAGNOSIS — Z8719 Personal history of other diseases of the digestive system: Secondary | ICD-10-CM | POA: Insufficient documentation

## 2021-08-06 DIAGNOSIS — Z803 Family history of malignant neoplasm of breast: Secondary | ICD-10-CM | POA: Diagnosis not present

## 2021-08-06 DIAGNOSIS — Z8711 Personal history of peptic ulcer disease: Secondary | ICD-10-CM | POA: Insufficient documentation

## 2021-08-06 DIAGNOSIS — E538 Deficiency of other specified B group vitamins: Secondary | ICD-10-CM

## 2021-08-06 DIAGNOSIS — K219 Gastro-esophageal reflux disease without esophagitis: Secondary | ICD-10-CM | POA: Diagnosis not present

## 2021-08-06 DIAGNOSIS — Z79899 Other long term (current) drug therapy: Secondary | ICD-10-CM | POA: Insufficient documentation

## 2021-08-06 DIAGNOSIS — Z8541 Personal history of malignant neoplasm of cervix uteri: Secondary | ICD-10-CM | POA: Insufficient documentation

## 2021-08-06 DIAGNOSIS — Z818 Family history of other mental and behavioral disorders: Secondary | ICD-10-CM | POA: Insufficient documentation

## 2021-08-06 DIAGNOSIS — Z833 Family history of diabetes mellitus: Secondary | ICD-10-CM | POA: Insufficient documentation

## 2021-08-06 LAB — CBC WITH DIFFERENTIAL/PLATELET
Abs Immature Granulocytes: 0.02 10*3/uL (ref 0.00–0.07)
Basophils Absolute: 0.1 10*3/uL (ref 0.0–0.1)
Basophils Relative: 1 %
Eosinophils Absolute: 0.1 10*3/uL (ref 0.0–0.5)
Eosinophils Relative: 2 %
HCT: 41.3 % (ref 36.0–46.0)
Hemoglobin: 14.8 g/dL (ref 12.0–15.0)
Immature Granulocytes: 0 %
Lymphocytes Relative: 44 %
Lymphs Abs: 3.4 10*3/uL (ref 0.7–4.0)
MCH: 33 pg (ref 26.0–34.0)
MCHC: 35.8 g/dL (ref 30.0–36.0)
MCV: 92 fL (ref 80.0–100.0)
Monocytes Absolute: 0.6 10*3/uL (ref 0.1–1.0)
Monocytes Relative: 8 %
Neutro Abs: 3.5 10*3/uL (ref 1.7–7.7)
Neutrophils Relative %: 45 %
Platelets: 355 10*3/uL (ref 150–400)
RBC: 4.49 MIL/uL (ref 3.87–5.11)
RDW: 12.5 % (ref 11.5–15.5)
WBC: 7.7 10*3/uL (ref 4.0–10.5)
nRBC: 0 % (ref 0.0–0.2)

## 2021-08-06 LAB — COMPREHENSIVE METABOLIC PANEL
ALT: 10 U/L (ref 0–44)
AST: 23 U/L (ref 15–41)
Albumin: 4.2 g/dL (ref 3.5–5.0)
Alkaline Phosphatase: 87 U/L (ref 38–126)
Anion gap: 12 (ref 5–15)
BUN: 13 mg/dL (ref 8–23)
CO2: 26 mmol/L (ref 22–32)
Calcium: 9.4 mg/dL (ref 8.9–10.3)
Chloride: 99 mmol/L (ref 98–111)
Creatinine, Ser: 0.78 mg/dL (ref 0.44–1.00)
GFR, Estimated: >60 mL/min (ref >60)
Glucose, Bld: 98 mg/dL (ref 70–99)
Potassium: 3.4 mmol/L — ABNORMAL LOW (ref 3.5–5.1)
Sodium: 137 mmol/L (ref 135–145)
Total Bilirubin: 0.7 mg/dL (ref 0.3–1.2)
Total Protein: 7.9 g/dL (ref 6.5–8.1)

## 2021-08-06 LAB — FERRITIN: Ferritin: 24 ng/mL (ref 11–307)

## 2021-08-06 MED ORDER — CYANOCOBALAMIN 1000 MCG/ML IJ SOLN
1000.0000 ug | Freq: Once | INTRAMUSCULAR | Status: AC
  Administered 2021-08-06: 1000 ug via INTRAMUSCULAR
  Filled 2021-08-06: qty 1

## 2021-08-06 MED ORDER — CYANOCOBALAMIN 1000 MCG/ML IJ SOLN: 1000.0000 ug | INTRAMUSCULAR | 0 refills | Status: DC

## 2021-08-06 MED ORDER — "SYRINGE 25G X 1"" 3 ML MISC": 3.0000 mL | 0 refills | Status: DC

## 2021-08-10 ENCOUNTER — Encounter: Payer: Self-pay | Admitting: Oncology

## 2021-08-10 ENCOUNTER — Encounter: Payer: Self-pay | Admitting: Urgent Care

## 2021-08-10 NOTE — Progress Notes (Signed)
Hematology/Oncology Consult note [facility]  Telephone:([phone] Fax:([phone]  Patient Care Team: Venia Carbon, MD as PCP - General Virgel Manifold, MD as Consulting Physician (Gastroenterology) Vickie Epley, MD (Inactive) as Consulting Physician (General Surgery) Debbora Dus, [facility] as Pharmacist (Pharmacist)   Name of the patient: Molly Peters  [id number]  12-11-1960   Date of visit: 08/10/21  Diagnosis-history of iron and B12 deficiency anemia  Chief complaint/ Reason for visit-routine follow-up of anemia  Heme/Onc history: Patient is a 60 year old female who is transferring her care from Dr. Mike Gip.  She has history of iron and B12 deficiency anemia.  She has received Venofer in the past and is on monthly B12 injections.EGD on 06/14/2018 revealed esophageal mucosal changes c/w short segment of Barrett's and a non-bleeding gastric ulcers.  Pathology at the GE junction revealed squamocolumnar mucosa with moderate chronic inflammation.  Gastric biopsy revealed no H pylori, dysplasia, or malignancy.  EGD on 05/04/2019 revealed non-bleeding gastric ulcer with a clean ulcer base (Forrest Class III) and friable gastric mucosa.  Biopsies revealed antral mild reactive gastropathy with no H pylori, metaplasia, dysplasia or malignancy.   Colonoscopy on 06/15/2018 revealed a 5 mm polyp in the sigmoid colon (tubular adenoma).  Colonoscopy on 05/04/2019 revealed three 4 - 5 mm polyps in the sigmoid colon (hyperplastic polyps) and diverticulosis in the sigmoid colon.    Patient is also getting low-dose screening CT chest for lung nodules.  Interval history- Patient is doing well overall and denies any specific complaints at this time other than mild fatigue.  Denies any blood loss in her stool or urine.  Denies any dark melanotic stools.  Appetite has remained stable and patient has not lost any further weight.  ECOG PS- 1 Pain scale-  0   Review of systems- Review of Systems  Constitutional:  Positive for malaise/fatigue. Negative for chills, fever and weight loss.  HENT:  Negative for congestion, ear discharge and nosebleeds.   Eyes:  Negative for blurred vision.  Respiratory:  Negative for cough, hemoptysis, sputum production, shortness of breath and wheezing.   Cardiovascular:  Negative for chest pain, palpitations, orthopnea and claudication.  Gastrointestinal:  Negative for abdominal pain, blood in stool, constipation, diarrhea, heartburn, melena, nausea and vomiting.  Genitourinary:  Negative for dysuria, flank pain, frequency, hematuria and urgency.  Musculoskeletal:  Negative for back pain, joint pain and myalgias.  Skin:  Negative for rash.  Neurological:  Negative for dizziness, tingling, focal weakness, seizures, weakness and headaches.  Endo/Heme/Allergies:  Does not bruise/bleed easily.  Psychiatric/Behavioral:  Negative for depression and suicidal ideas. The patient does not have insomnia.       Allergies  Allergen Reactions   Carbamazepine Hives   Divalproex Sodium Swelling and Other (See Comments)    HAIR FALLS OUT   Clonazepam Other (See Comments)    HALLUCINATIONS   Diphenhydramine Hcl Rash   Tiotropium Bromide Monohydrate Other (See Comments)    CREATES YEAST IN THROAT   Vicodin [Hydrocodone-Acetaminophen] Itching     Past Medical History:  Diagnosis Date   Anxiety    Cervical cancer ([place]) 1980's   Chronic back pain 11/12/2009   Qualifier: Diagnosis of  By: Maxie Better FNP, Rosalita Levan    COPD (chronic obstructive pulmonary disease) ([place])    states SOB with ADLs; no O2 use; able to speak in complete sentences without SOB(11/15/2014)   Depression    Difficulty swallowing pills    s/p cervical fusion  Family history of adverse reaction to anesthesia    pt's mother and sister have hx. of post-op N/V   Fibromyalgia    GERD (gastroesophageal reflux disease)    Heart murmur    History of  gastric ulcer    Hypertension    states under control with med., has been on med. x 1-2 yr.   IBS (irritable bowel syndrome)    Internal hemorrhoid    states has had intermittent bright red bleeding with BM (11/15/2014)   Intraductal papilloma of breast, right 08/08/2018   Limited joint range of motion    neck - s/p cervical fusion   Localized primary osteoarthritis of right shoulder region 11/23/2014   Osteoarthritis of left shoulder 07/05/2015   Osteoarthritis of right shoulder 11/2014   Pneumonia    Seizures ([place])    last seizure 2010   Short-term memory loss    TMJ syndrome    Valvular heart disease    Initial workup a number of years ago at [facility].  Echo (10/2009) showed EF 60-65%,      normal LV size, moderate aortic insufficiency with a trileaflet aortic valve and normal aortic root size.  Mild      mitral regurgitation.    Wears dentures    lower     Past Surgical History:  Procedure Laterality Date   ABDOMINAL HYSTERECTOMY     partial   ANTERIOR CERVICAL DECOMP/DISCECTOMY FUSION  02/06/2011   C4-5, C5-6, C6-7   BREAST BIOPSY Bilateral 10+ yrs ago   neg   BREAST BIOPSY Right 08/03/2018   2 areas bx, -neg   BREAST LUMPECTOMY Bilateral 1989   x 3 - benign   BREAST LUMPECTOMY Right 08/24/2018   Procedure: BREAST LUMPECTOMY WITH ULTRASOUND IN OR;  Surgeon: Vickie Epley, MD;  Location: [facility] ORS;  Service: General;  Laterality: Right;   [place]     x 2   COLONOSCOPY  09/28/2008   COLONOSCOPY N/A 06/15/2018   Procedure: COLONOSCOPY;  Surgeon: Virgel Manifold, MD;  Location: [facility] ENDOSCOPY;  Service: Endoscopy;  Laterality: N/A;   COLONOSCOPY WITH PROPOFOL N/A 05/04/2019   Procedure: COLONOSCOPY WITH PROPOFOL;  Surgeon: Virgel Manifold, MD;  Location: [facility] ENDOSCOPY;  Service: Endoscopy;  Laterality: N/A;   COLONOSCOPY WITH PROPOFOL N/A 04/21/2021   Procedure: COLONOSCOPY WITH PROPOFOL;  Surgeon: Virgel Manifold, MD;   Location: [facility] ENDOSCOPY;  Service: Endoscopy;  Laterality: N/A;   ESOPHAGOGASTRODUODENOSCOPY  09/28/2008   ESOPHAGOGASTRODUODENOSCOPY N/A 06/14/2018   Procedure: ESOPHAGOGASTRODUODENOSCOPY (EGD);  Surgeon: Virgel Manifold, MD;  Location: [facility] ENDOSCOPY;  Service: Endoscopy;  Laterality: N/A;   ESOPHAGOGASTRODUODENOSCOPY (EGD) WITH PROPOFOL N/A 05/04/2019   Procedure: ESOPHAGOGASTRODUODENOSCOPY (EGD) WITH PROPOFOL;  Surgeon: Virgel Manifold, MD;  Location: [facility] ENDOSCOPY;  Service: Endoscopy;  Laterality: N/A;   ESOPHAGOGASTRODUODENOSCOPY (EGD) WITH PROPOFOL N/A 03/18/2020   Procedure: ESOPHAGOGASTRODUODENOSCOPY (EGD) WITH PROPOFOL;  Surgeon: Lin Landsman, MD;  Location: [facility] ENDOSCOPY;  Service: Gastroenterology;  Laterality: N/A;   ESOPHAGOGASTRODUODENOSCOPY (EGD) WITH PROPOFOL N/A 04/21/2021   Procedure: ESOPHAGOGASTRODUODENOSCOPY (EGD) WITH PROPOFOL;  Surgeon: Virgel Manifold, MD;  Location: [facility] ENDOSCOPY;  Service: Endoscopy;  Laterality: N/A;   HARDWARE REMOVAL  11/17/2006   L5-S1   LAMINECTOMY WITH POSTERIOR LATERAL ARTHRODESIS LEVEL 3  12/06/2009   with synovial cyst resection L3-4 bilat.   LAMINECTOMY WITH POSTERIOR LATERAL ARTHRODESIS LEVEL 3  11/17/2006   L4-5   NM MYOVIEW LTD     Lexiscan myoview (  10/2009): EF 77%, normal wall motion, normal perfusion.    SHOULDER ARTHROSCOPY WITH DEBRIDEMENT AND BICEP TENDON REPAIR Right 04/13/2014   Procedure: RIGHT SHOULDER ARTHROSCOPY WITH DEBRIDEMENT EXTENSIVE;  Surgeon: Johnny Bridge, MD;  Location: [place];  Service: Orthopedics;  Laterality: Right;   TOTAL SHOULDER ARTHROPLASTY Right 11/23/2014   Procedure: TOTAL RIGHT SHOULDER ARTHROPLASTY;  Surgeon: Johnny Bridge, MD;  Location: [place];  Service: Orthopedics;  Laterality: Right;   TOTAL SHOULDER ARTHROPLASTY Left 07/05/2015   Procedure: LEFT TOTAL SHOULDER REPLACEMENT;  Surgeon: Marchia Bond, MD;  Location: [place];   Service: Orthopedics;  Laterality: Left;    Social History   Socioeconomic History   Marital status: Married    Spouse name: Not on file   Number of children: 3   Years of education: Not on file   Highest education level: Not on file  Occupational History   Occupation: Disabled 2003  Tobacco Use   Smoking status: Every Day    Packs/day: 1.00    Years: 42.75    Pack years: 42.75    Types: Cigarettes   Smokeless tobacco: Never  Vaping Use   Vaping Use: Never used  Substance and Sexual Activity   Alcohol use: Yes    Alcohol/week: 0.0 standard drinks    Comment: rare   Drug use: Yes    Types: Marijuana    Comment: marijuana 2 days ago   Sexual activity: Not on file  Other Topics Concern   Not on file  Social History Narrative   No living will   Husband, then oldest son Roosvelt Harps, should make decisions   Requests DNR--done 06/25/20   No tube feeds if cognitively unaware   Social Determinants of Health   Financial Resource Strain: Low Risk    Difficulty of Paying Living Expenses: Not very hard  Food Insecurity: Not on file  Transportation Needs: Not on file  Physical Activity: Not on file  Stress: Not on file  Social Connections: Not on file  Intimate Partner Violence: Not on file    Family History  Problem Relation Age of Onset   Cervical cancer Mother    Anesthesia problems Mother        post-op N/V   Rheum arthritis Mother    Anxiety disorder Mother    Cancer Father        colon cancer   Migraines Sister    Cervical cancer Sister    Anesthesia problems Sister        post-op N/V   Migraines Brother    Asthma Daughter    Cerebral palsy Daughter        age 56   Coronary artery disease Maternal Grandmother    Hypertension Maternal Grandmother    Diabetes Maternal Grandmother    Coronary artery disease Paternal Grandmother    Hypertension Paternal Grandmother    Diabetes Paternal Grandmother    Breast cancer Maternal Aunt    Breast cancer Maternal  Aunt    Breast cancer Maternal Aunt    Colon cancer Paternal Uncle    Lung cancer Paternal Uncle      Current Outpatient Medications:    albuterol (VENTOLIN HFA) 108 (90 Base) MCG/ACT inhaler, INHALE 2 PUFFS BY MOUTH EVERY 6 HOURS ASNEEDED WHEEEZING/ SHORTNESS OF BREATH, Disp: 8.5 g, Rfl: 1   ALPRAZolam (XANAX) 1 MG tablet, Take 1/2 to 1 tablet 3 times daily as needed, Disp: 90 tablet, Rfl: 0   CREON 24000-76000  units CPEP, TAKE 2 CAPSULES BY MOUTH 3 TIMES A DAY AND 1 CAPSULE WITH SNACKS, Disp: 810 capsule, Rfl: 0   cyanocobalamin (,VITAMIN B-12,) 1000 MCG/ML injection, Inject 1 mL (1,000 mcg total) into the muscle every 30 (thirty) days for 12 doses., Disp: 12 mL, Rfl: 0   fluticasone (FLONASE) 50 MCG/ACT nasal spray, Place 2 sprays into both nostrils daily., Disp: , Rfl:    lamoTRIgine (LAMICTAL) 100 MG tablet, Take 1 tablet (100 mg total) by mouth 2 (two) times daily., Disp: 180 tablet, Rfl: 3   losartan-hydrochlorothiazide (HYZAAR) 100-12.5 MG tablet, TAKE 1 TABLET BY MOUTH ONCE DAILY, Disp: 90 tablet, Rfl: 3   morphine 30 MG SUPP, Place 30 mg rectally every 3 (three) hours as needed. Per [facility] ER visit due to neck pain., Disp: , Rfl:    ondansetron (ZOFRAN-ODT) 4 MG disintegrating tablet, DISSOLVE 1 TABLET ON THE TONGUE EVERY 8 HOURS AS NEEDED FOR NAUSEA OR VOMIT, Disp: 30 tablet, Rfl: 0   pantoprazole (PROTONIX) 40 MG tablet, Take 1 tablet (40 mg total) by mouth 2 (two) times daily before a meal., Disp: 180 tablet, Rfl: 0   PARoxetine (PAXIL) 20 MG tablet, Take 1 tablet (20 mg total) by mouth daily., Disp: 90 tablet, Rfl: 3   Syringe/Needle, Disp, (SYRINGE 3CC/25GX1") 25G X 1" 3 ML MISC, 3 mLs by Does not apply route every 30 (thirty) days., Disp: 12 each, Rfl: 0   tiZANidine (ZANAFLEX) 2 MG tablet, TAKE 1-2 TABLETS BY MOUTH EVERY 6 HOURS AS NEEDED FOR MUSCLE SPASMS, Disp: 30 tablet, Rfl: 0  Physical exam:  Vitals:   08/06/21 1324  BP: (!) 158/104  Pulse: 72  Resp: 14  Temp: (!) 97.1  F (36.2 C)  SpO2: 97%  Weight: 94 lb 5.7 oz (42.8 kg)   Physical Exam Constitutional:      General: She is not in acute distress. Cardiovascular:     Rate and Rhythm: Normal rate and regular rhythm.     Heart sounds: Normal heart sounds.  Pulmonary:     Effort: Pulmonary effort is normal.     Breath sounds: Normal breath sounds.  Abdominal:     General: Bowel sounds are normal.     Palpations: Abdomen is soft.  Skin:    General: Skin is warm and dry.  Neurological:     Mental Status: She is alert and oriented to person, place, and time.     CMP Latest Ref Rng & Units 08/06/2021  Glucose 70 - 99 mg/dL 98  BUN 8 - 23 mg/dL 13  Creatinine 0.44 - 1.00 mg/dL 0.78  Sodium 135 - 145 mmol/L 137  Potassium 3.5 - 5.1 mmol/L 3.4(L)  Chloride 98 - 111 mmol/L 99  CO2 22 - 32 mmol/L 26  Calcium 8.9 - 10.3 mg/dL 9.4  Total Protein 6.5 - 8.1 g/dL 7.9  Total Bilirubin 0.3 - 1.2 mg/dL 0.7  Alkaline Phos 38 - 126 U/L 87  AST 15 - 41 U/L 23  ALT 0 - 44 U/L 10   CBC Latest Ref Rng & Units 08/06/2021  WBC 4.0 - 10.5 K/uL 7.7  Hemoglobin 12.0 - 15.0 g/dL 14.8  Hematocrit 36.0 - 46.0 % 41.3  Platelets 150 - 400 K/uL 355     Assessment and plan- Patient is a 60 y.o. female with iron and B12 deficiency anemia here for routine follow-up  Patient is not currently anemic with an H&H of 14.8/21.8.  LFTs are normal mild low potassium of 3.4.  Ferritin levels are low at 24.  Given that she is not anemic I have asked her to take oral iron every other day if she can tolerate it.  She has also been receiving monthly B12 injections at the [facility] and I have asked her to see if she can take B12 injections self-administered and we will send in the prescription for the same.  Repeat CBC ferritin and iron studies and B12 in 6 months followed by video visit 2 days later   Visit Diagnosis 1. Iron deficiency anemia due to chronic blood loss   2. B12 deficiency      Dr. Randa Evens, MD,  [facility] [phone] 08/10/2021 7:19 AM

## 2021-08-12 ENCOUNTER — Ambulatory Visit: Payer: Medicare HMO | Admitting: Gastroenterology

## 2021-08-25 ENCOUNTER — Telehealth: Payer: Self-pay

## 2021-08-25 NOTE — Progress Notes (Addendum)
Chronic Care Management Pharmacy Assistant   Name: Molly Peters  MRN: [id number] DOB: [birth year]  Reason for Encounter: CCM (General Adherence)   Recent office visits:  06/30/2021 - Viviana Simpler, MD - Patient presented for Annual Wellness visit. Labs: CBC, CMP and T4. Change: ALPRAZolam (XANAX) 1 MG tablet. Stop: Tiotropium Bromide Monohydrate (SPIRIVA RESPIMAT) 2.5 MCG/ACT AERS due to patient preference.   Recent consult visits:  08/06/2021 - Oncology - Patient presented for Iron Deficiency. Labs: CBC, Ferritin, Iron and TIBC, Vit B. Start: cyanocobalamin (,VITAMIN B-12,) 1000 MCG/ML injection. 07/12/2021 [facility] Emergency Department - Patient presented for neck pain. Start: Methocarbamol 500mg  and Morphine 30mg . Discharged same day.  07/06/2021 [facility] - Patient presented for [place]. Procedures: PR INJECTION,THERAP/PROPH/DIAGNOST, IM OR SUBCUT and PR KETOROLAC TROMETHAMINE INJ. Discharged same day.  07/01/2021 - [place] - Patient presented for Iron Deficiency; [place]. Procedures: PR VITAMIN B12 INJECTION and PR INJECTION,THERAP/PROPH/DIAGNOST, IM [facility] visits:  None since last CCM contact  Medications: Outpatient Encounter Medications as of 08/25/2021  Medication Sig   albuterol (VENTOLIN HFA) 108 (90 Base) MCG/ACT inhaler INHALE 2 PUFFS BY MOUTH EVERY 6 HOURS ASNEEDED WHEEEZING/ SHORTNESS OF BREATH   ALPRAZolam (XANAX) 1 MG tablet Take 1/2 to 1 tablet 3 times daily as needed   CREON 24000-76000 units CPEP TAKE 2 CAPSULES BY MOUTH 3 TIMES A DAY AND 1 CAPSULE WITH SNACKS   cyanocobalamin (,VITAMIN B-12,) 1000 MCG/ML injection Inject 1 mL (1,000 mcg total) into the muscle every 30 (thirty) days for 12 doses.   fluticasone (FLONASE) 50 MCG/ACT nasal spray Place 2 sprays into both nostrils daily.   lamoTRIgine (LAMICTAL) 100 MG tablet Take 1 tablet (100 mg total) by mouth 2 (two) times daily.    losartan-hydrochlorothiazide (HYZAAR) 100-12.5 MG tablet TAKE 1 TABLET BY MOUTH ONCE DAILY   morphine 30 MG SUPP Place 30 mg rectally every 3 (three) hours as needed. [facility] visit due to neck pain.   ondansetron (ZOFRAN-ODT) 4 MG disintegrating tablet DISSOLVE 1 TABLET ON THE TONGUE EVERY 8 HOURS AS NEEDED FOR NAUSEA OR VOMIT   pantoprazole (PROTONIX) 40 MG tablet Take 1 tablet (40 mg total) by mouth 2 (two) times daily before a meal.   PARoxetine (PAXIL) 20 MG tablet Take 1 tablet (20 mg total) by mouth daily.   Syringe/Needle, Disp, (SYRINGE 3CC/25GX1") 25G X 1" 3 ML MISC 3 mLs by Does not apply route every 30 (thirty) days.   tiZANidine (ZANAFLEX) 2 MG tablet TAKE 1-2 TABLETS BY MOUTH EVERY 6 HOURS AS NEEDED FOR MUSCLE SPASMS   No facility-administered encounter medications on file as of 08/25/2021.   [place] on 08/27/2021 for general disease state and medication adherence call.   Patient is not > 5 days past due for refill on the following medications per chart history:  [facility] Medications: Medication Name/mg  Last Fill Days Supply Losartan HCTZ 100-12.5mg   05/30/2021 90  What concerns do you have about your medications? Patient did not have any concerns.   The patient denies side effects with her medications.   How often do you forget or accidentally miss a dose? Rarely  Do you use a pillbox? No  Are you having any problems getting your medications from your pharmacy? No  Has the cost of your medications been a concern? No  Since last visit with CPP, no interventions have been made.  The patient has had an ED visit since last  contact.   The patient denies problems with their health.   she denies  concerns or questions for Debbora Dus, Pharm. D at this time.   Counseled patient on:  Great job taking medications  Care Gaps: Annual wellness visit in last year? No Most Recent BP reading: 158/104 on 08/06/2021  CCM appointment on  09/16/2021  Debbora Dus, CPP notified  Marijean Niemann, [place] Assistant [phone]  I have reviewed the care management and care coordination activities outlined in this encounter and I am certifying that I agree with the content of this note. No further action required.  Debbora Dus, PharmD Clinical Pharmacist [facility] [place] Time Spent: 30 Minutes

## 2021-08-26 LAB — URINALYSIS WITH REFLEX TO CULTURE
Bilirubin Urine: NEGATIVE
Glucose, UA: NEGATIVE
Ketones, Urine: NEGATIVE
Nitrite, Urine: NEGATIVE
Specific Gravity, UA: 1.02 (ref 1.003–1.035)
Urobilinogen, Urine: 0.2 EU/dL
pH, UA: 6 (ref 4.5–8.0)

## 2021-08-26 NOTE — ED Notes (Signed)
ED Patient Education Note     Patient Education Materials Follows:  Obstetrics and Gynecology     Urinary Tract Infection, Adult      A urinary tract infection (UTI) is an infection of any part of the urinary tract. The urinary tract includes:   The kidneys.     The ureters.     The bladder.     The urethra.      These organs make, store, and get rid of pee (urine) in the body.      What are the causes?    This infection is caused by germs (bacteria) in your genital area. These germs grow and cause swelling (inflammation) of your urinary tract.      What increases the risk?    The following factors may make you more likely to develop this condition:   Using a small, thin tube (catheter) to drain pee.     Not being able to control when you pee or poop (incontinence).     Being female. If you are female, these things can increase the risk:  ? Using these methods to prevent pregnancy:  ? A medicine that kills sperm (spermicide).    ? A device that blocks sperm (diaphragm).      ? Having low levels of a female hormone (estrogen).    ? Being pregnant.        You are more likely to develop this condition if:   You have genes that add to your risk.     You are sexually active.     You take antibiotic medicines.      You have trouble peeing because of:  ? A prostate that is bigger than normal, if you are female.    ? A blockage in the part of your body that drains pee from the bladder.    ? A kidney stone.     ? A nerve condition that affects your bladder.    ? Not getting enough to drink.     ? Not peeing often enough.       You have other conditions, such as:  ? Diabetes.     ? A weak disease-fighting system (immune system).    ? Sickle cell disease.     ? Gout.     ? Injury of the spine.          What are the signs or symptoms?    Symptoms of this condition include:   Needing to pee right away.     Peeing small amounts often.     Pain or burning when peeing.     Blood in the pee.     Pee that smells bad or not like  normal.     Trouble peeing.     Pee that is cloudy.     Fluid coming from the vagina, if you are female.     Pain in the belly or lower back.      Other symptoms include:   Vomiting.     Not feeling hungry.     Feeling mixed up (confused). This may be the first symptom in older adults.     Being tired and grouchy (irritable).     A fever.     Watery poop (diarrhea).        How is this treated?     Taking antibiotic medicine.     Taking other medicines.     Drinking enough water.      In some cases, you may need to see a specialist.      Follow these instructions at home:      Medicines     Take over-the-counter and prescription medicines only as told by your doctor.     If you were prescribed an antibiotic medicine, take it as told by your doctor. Do not stop taking it even if you start to feel better.      General instructions     Make sure you:  ? Pee until your bladder is empty.     ? Do not hold pee for a long time.    ? Empty your bladder after sex.    ? Wipe from front to back after peeing or pooping if you are a female. Use each tissue one time when you wipe.       Drink enough fluid to keep your pee pale yellow.     Keep all follow-up visits.        Contact a doctor if:     You do not get better after 1?2 days.     Your symptoms go away and then come back.      Get help right away if:     You have very bad back pain.     You have very bad pain in your lower belly.     You have a fever.     You have chills.     You feeling like you will vomit or you vomit.      Summary     A urinary tract infection (UTI) is an infection of any part of the urinary tract.     This condition is caused by germs in your genital area.     There are many risk factors for a UTI.     Treatment includes antibiotic medicines.     Drink enough fluid to keep your pee pale yellow.      This information is not intended to replace advice given to you by your health care provider. Make sure you discuss any questions you have with your health  care provider.      Document Revised: 06/07/2020 Document Reviewed: 06/07/2020  Elsevier Patient Education ? 2021 [employer]

## 2021-08-26 NOTE — ED Notes (Signed)
ED Triage Note       ED Secondary Triage Entered On:  08/26/2021 7:16 EDT    Performed On:  08/26/2021 7:12 EDT by Tally Due, RN, RACHAEL M               General Information   Barriers to Learning :   None evident   Languages :   English   COVID-19 Vaccine Status :   2 Doses received   2 Doses Received Manufacturer :   Pfizer vaccine   ED Home Meds Section :   Document assessment   [facility] ED Fall Risk Section :   Document assessment   ED Advance Directives Section :   Document assessment   ED Palliative Screen :   N/A (prefilled for <65yo)   Verrochi, RN, Burnell Blanks M - 08/26/2021 7:12 EDT   (As Of: 08/26/2021 07:16:20 EDT)   Problems(Active)    Anxiety (SNOMED CT  :[id number] )  Name of Problem:   Anxiety ; Recorder:   Verrochi, RN, Earlene Plater; Confirmation:   Confirmed ; Classification:   Patient Stated ; Code:   [id number] ; Contributor System:   PowerChart ; Last Updated:   08/26/2021 7:14 EDT ; Life Cycle Date:   08/26/2021 ; Life Cycle Status:   Active ; Vocabulary:   SNOMED CT          Diagnoses(Active)    Dysuria  Date:   08/26/2021 ; Diagnosis Type:   Reason For Visit ; Confirmation:   Complaint of ; Clinical Dx:   Dysuria ; Classification:   Medical ; Clinical Service:   Non-Specified ; Code:   PNED ; Probability:   0 ; Diagnosis Code:   [id number]             -    Procedure History   (As Of: 08/26/2021 07:16:20 EDT)     Anesthesia Minutes:   0 ; Procedure Name:   Fibroid ; Procedure Minutes:   0 ; Last Reviewed Dt/Tm:   08/26/2021 07:14:32 EDT            [facility] Fall Risk Assessment Tool   Hx of falling last 3 months ED Fall :   No   Patient confused or disoriented ED Fall :   No   Patient intoxicated or sedated ED Fall :   No   Patient impaired gait ED Fall :   No   Use a mobility assistance device ED Fall :   No   Patient altered elimination ED Fall :   No   [facility] ED Fall Score :   0    Verrochi, RN, Burnell Blanks M - 08/26/2021 7:12 EDT   ED Advance Directive   Advance Directive :    No   Verrochi, RN, Earlene Plater - 08/26/2021 7:12 EDT   Med Hx   Medication List   (As Of: 08/26/2021 07:16:20 EDT)   Home Meds      Status:   Processing ; Ordered As Mnemonic:   Wellbutrin ; Simple Display Line:   150 mg, Oral, 0 Refill(s) ; Action Display:   Document ; Catalog Code:   buPROPion ; Order Dt/Tm:   08/26/2021 07:16:02 EDT

## 2021-08-26 NOTE — Discharge Summary (Signed)
ED Clinical Summary                     [facility]  [address]  [place], [place], [address]  [phone]          PERSON INFORMATION  Name: Tabitha Fisher, Tabitha Fisher Age:  60 Years DOB: 1961/02/16   Sex: Female Language: English PCP: PCP,  UNKNOWN   Marital Status: Divorced Phone:  Med Service: MED-Medicine   MRN: [id number] Acct# [id number] Arrival: 08/26/2021 06:13:00   Visit Reason: Dysuria; VAGINAL BURNING/ABD PAIN Acuity: 4 LOS: 000 02:45   Address:    [address] [facility] [place] [address]   Diagnosis:    Acute UTI  Medications:          New Medications  Printed Prescriptions  cefdinir (cefdinir 300 mg oral capsule) 1 Capsules Oral (given by mouth) every 12 hours for 7 Days. Refills: 0.  Last Dose:____________________  phenazopyridine (Pyridium 200 mg oral tablet) 1 Tabs Oral (given by mouth) 3 times a day for 3 Days. Refills: 0.  Last Dose:____________________  Medications that have not changed  Other Medications  buPROPion (Wellbutrin) 150 Milligram Oral (given by mouth).  Last Dose:____________________      Medications Administered During Visit:                Medication Dose Route   cefdinir 300 mg Oral   phenazopyridine 200 mg Oral               Allergies      No Known Medication Allergies      Major Tests and Procedures:  The following procedures and tests were performed during your ED visit.  COMMON PROCEDURES%>  COMMON PROCEDURES COMMENTS%>                PROVIDER INFORMATION               Provider Role Assigned Dominica Severin [facility] ED Provider 08/26/2021 06:22:18    Hulda Humphrey ED Nurse 08/26/2021 06:27:22 08/26/2021 07:10:58   Verrochi, RN, Earlene Plater ED Nurse 08/26/2021 07:56:35        Attending Physician:  Marzetta Board CHARLES-MD      Admit Doc  DITTRICH,  THOMAS CHARLES-MD     Consulting Doc       VITALS INFORMATION  Vital Sign Triage Latest   Temp Oral ORAL_1%> ORAL%>   Temp Temporal TEMPORAL_1%> TEMPORAL%>   Temp Intravascular  INTRAVASCULAR_1%> INTRAVASCULAR%>   Temp Axillary AXILLARY_1%> AXILLARY%>   Temp Rectal RECTAL_1%> RECTAL%>   02 Sat 98 % 100 %   Respiratory Rate RATE_1%> RATE%>   Peripheral Pulse Rate PULSE RATE_1%> PULSE RATE%>   Apical Heart Rate HEART RATE_1%> HEART RATE%>   Blood Pressure BLOOD PRESSURE_1%>/ BLOOD PRESSURE_1%>81 mmHg BLOOD PRESSURE%> / BLOOD PRESSURE%>83 mmHg                 Immunizations      No Immunizations Documented This Visit          DISCHARGE INFORMATION   Discharge Disposition: H Outpt-Sent Home   Discharge Location:  Home   Discharge Date and Time:  08/26/2021 08:58:52   ED Checkout Date and Time:  08/26/2021 08:58:52     DEPART REASON INCOMPLETE INFORMATION               Depart Action Incomplete Reason   Interactive View/I&O Recently assessed  Problems      No Problems Documented              Smoking Status      No Smoking Status Documented         PATIENT EDUCATION INFORMATION  Instructions:     Urinary Tract Infection, Adult, Easy-to-Read     Follow up:                   With: Address: When:   Return to Emergency Department  , only if needed   Comments:   if you have any new or worsening symptoms              ED PROVIDER DOCUMENTATION     Patient:   Tabitha Fisher, Tabitha Fisher             MRN: [id number]            FIN: [id number]               Age:   60 years     Sex:  Female     DOB:  [birth year]   Associated Diagnoses:   Acute UTI   Author:   Marzetta Board CHARLES-MD      History of Present Illness   The patient presents with dysuria and frequency.  The onset was 1  days ago.  The course/duration of symptoms is constant.  Radiating pain: none. The character of symptoms is burning.  The degree at onset was moderate.  The degree at present is moderate.  The exacerbating factor is none.  The relieving factor is none.  Risk factors consist of none.  Therapy today: none.  Associated symptoms: denies fever, denies chills, denies nausea, denies vomiting, denies abdominal pain, denies back pain,  denies pelvic pain, denies hematuria, denies urethral discharge and denies rash.        Review of Systems   Constitutional symptoms:  Negative except as documented in HPI.   Skin symptoms:  Negative except as documented in HPI.   Eye symptoms:  Negative except as documented in HPI.   Respiratory symptoms:  Negative except as documented in HPI.   Cardiovascular symptoms:  Negative except as documented in HPI.   Gastrointestinal symptoms:  Negative except as documented in HPI.   Genitourinary symptoms:  Negative except as documented in HPI.   Musculoskeletal symptoms:  Negative except as documented in HPI.   Neurologic symptoms:  Negative except as documented in HPI.   Psychiatric symptoms:  Negative except as documented in HPI.             Additional review of systems information: All other systems reviewed and otherwise negative.      Past Medical/ Family/ Social History   Medical history: Reviewed as documented in chart.   Surgical history: Reviewed as documented in chart.   Family history: Not significant.   Social history: Reviewed as documented in chart.   Problem list:    No qualifying data available  .      Physical Examination               Vital Signs   Oxygen saturation.   General:  Alert, no acute distress.    Skin:  Warm, dry, pink.    Head:  Normocephalic.   Neck:  Supple, trachea midline.    Eye:  Pupils are equal, round and reactive to light, extraocular movements are intact.    Ears, nose, mouth and throat:  Oral mucosa  moist.   Cardiovascular:  Regular rate and rhythm.   Respiratory:  Lungs are clear to auscultation, respirations are non-labored.    Back:  Normal range of motion.   Musculoskeletal:  Normal ROM, no tenderness.    Chest wall   Gastrointestinal:  Soft, Nontender, Non distended, Normal bowel sounds, No organomegaly.    Neurological:  Alert and oriented to person, place, time, and situation, No focal neurological deficit observed, normal sensory observed, normal motor observed.     Lymphatics:  No lymphadenopathy.   Psychiatric:  Cooperative, appropriate mood & affect.       Medical Decision Making   Differential Diagnosis:  Urinary tract infection, cystitis, urethritis, vulvovaginitis.    Rationale:  60yo female with dysuria. Plan for ua and culture.   Results review:  All Results   08/26/2021 7:12 EDT ED Triage Note     08/26/2021 6:39 EDT ED Note-Physician Dysuria *ED (In Progress)   08/26/2021 6:22 EDT ED Triage Note     08/26/2021 6:13 EDT Consent Forms     08/26/2021 7:22 EDT UA Color Yellow     UA Appear Slightly Cloudy     UA Glucose Negative     UA Bili Negative     UA Ketones Negative     UA Spec Grav 1.020     UA Blood Large     UA pH 6.0     Protein U Trace     UA Urobilinogen 0.2 EU/dL     UA Nitrite Negative     UA Leuk Est Large     WBC U 11-20 /HPF     RBC U 3-5 /HPF     Sq Epi U Few /LPF     Bacteria U Few    08/26/2021 7:16 EDT Genitourinary Symptoms Dysuria, Urgency     Urine Color Yellow     Urine Description Cloudy     Skin Color General Usual for ethnicity     Skin Temperature Warm     Skin Moisture General Dry     Level of Consciousness - APVU Alert     Affect/Behavior Appropriate     Orientation Assessment Oriented x 4     Respirations Unlabored     Respiratory Pattern Regular     Patient Airway Status Patent without support     Oxygen Therapy Room air     Systolic Blood Pressure 159 mmHg  HI     Diastolic Blood Pressure 80 mmHg     Heart Rate Monitored 60 bpm     Respiratory Rate 15 br/min     SpO2 100 %    08/26/2021 7:12 EDT Advanced Directives No     Hx of falling last 3 months ED Fall No     Patient confused of disoriented ED Fall No     Patient intoxicated or sedated ED Fall No     Patient have an impaired gait ED Fall No     Use a mobility assistance device ED Fall No     Patient altered elimination ED Fall No     [facility] ED Fall Score 0     COVID-19 Vaccine Status 2 Doses received     Barriers to Learning None evident    08/26/2021 6:24 EDT Body Mass Index  est meas 21.68 kg/m2     Body Mass Index Measured 21.68 kg/m2    08/26/2021 6:22 EDT ED Behavioral Activity Rating Scale 4 - Quiet and awake (normal level of activity)  Pregnancy Status Patient denies     SIRS Possible Source of Infectionn Yes     SIRS Source of Infection None of the above     Last 3 mo, thoughts killing self/others Patient denies     Suspect or Concern for: None     Feels Safe Where Live Yes     Oxygen Therapy Room air     TB Symptom Screen ID No symptoms     C. diff Symptom/History ID Neither of the above     Last 90 days COVID-19 ID No     Height/Length Measured 165.1 cm     Weight Dosing 59.1 kg     Numeric Rating Pain Scale 2     Systolic Blood Pressure 160 mmHg  HI     Diastolic Blood Pressure 81 mmHg     Temperature Oral 36.3 degC     Heart Rate Monitored 59 bpm  LOW     Respiratory Rate 18 br/min     SpO2 98 %    .   Notes:  Workup demonstrates bacteruria. Plan for treatment with cefdinir. Will refer to urology for recurrent UTI. .      Impression and Plan   Diagnosis   Acute UTI (ICD10-CM N39.0, Discharge, Medical)   Plan   Condition: Stable.    Disposition: Discharged: to home.    Prescriptions: Launch prescriptions   Pharmacy:  cefdinir 300 mg oral capsule (Prescribe): 300 mg, 1 caps, Oral, q12hr, for 7 days, 14 caps, 0 Refill(s).    Patient was given the following educational materials: Urinary Tract Infection, Adult, Easy-to-Read, Urinary Tract Infection, Adult, Easy-to-Read.    Limitations: Limited activity.    Follow up with: Return to Emergency Department , only if needed if you have any new or worsening symptoms, Return to Emergency Department, In: as needed.    Counseled: Patient.

## 2021-08-26 NOTE — ED Notes (Signed)
 ED Patient Summary       ;       [facility] Emergency Department  [address] [place] [address]  [phone]  Discharge Instructions (Patient)  Name: Tabitha Fisher, Tabitha Fisher  DOB: [birth year]                   MRN: [id number]                   FIN: [id number]  Reason For Visit: Dysuria; VAGINAL BURNING/ABD PAIN  Final Diagnosis: Acute UTI     Visit Date: 08/26/2021 06:13:00  Address: [address] [facility] [place] [address]  Phone:      Emergency Department Providers:         Primary Physician:      WILNETTE DEBBY CARLIN Tabitha Fisher would like to thank you for allowing us  to assist you with your healthcare needs. The following includes patient education materials and information regarding your injury/illness.     Follow-up Instructions:  You were seen today on an emergency basis. Please contact your primary care doctor for a follow up appointment. If you received a referral to a specialist doctor, it is important you follow-up as instructed.    It is important that you call your follow-up doctor to schedule and confirm the location of your next appointment. Your doctor may practice at multiple locations. The office location of your follow-up appointment may be different to the one written on your discharge instructions.    If you do not have a primary care doctor, please call [phone] for help in finding a [facility] Provider. For help in finding a specialist doctor, please call [phone].    If your condition gets worse before your follow-up with your primary care doctor or specialist, please return to the Emergency Department.      Coronavirus 2019 (COVID-19) Reminders:     Patients age 37 - 43, with parental consent, and patients over age 3 can make an appointment for a COVID-19 vaccine. Patients can contact their [facility] doctors' offices to schedule an appointment to receive the COVID-19 vaccine. Patients who do not have a  [facility] physician can call [phone] to schedule vaccination appointments.      Follow Up Appointments:  Primary Care Provider:     Name: PCP,  UNKNOWN     Phone:                  With: Address: When:   Return to Emergency Department  , only if needed   Comments:   if you have any new or worsening symptoms              Post [facility] SERVICES%>          New Medications  Printed Prescriptions  cefdinir (cefdinir 300 mg oral capsule) 1 Capsules Oral (given by mouth) every 12 hours for 7 Days. Refills: 0.  Last Dose:____________________  phenazopyridine (Pyridium 200 mg oral tablet) 1 Tabs Oral (given by mouth) 3 times a day for 3 Days. Refills: 0.  Last Dose:____________________  Medications that have not changed  Other Medications  buPROPion (Wellbutrin) 150 Milligram Oral (given by mouth).  Last Dose:____________________      Allergy Info: No Known Medication Allergies     Discharge Additional Information          Discharge Patient 08/26/21 8:44:00  EDT      Patient Education Materials:        Urinary Tract Infection, Adult      A urinary tract infection (UTI) is an infection of any part of the urinary tract. The urinary tract includes:   The kidneys.     The ureters.     The bladder.     The urethra.      These organs make, store, and get rid of pee (urine) in the body.      What are the causes?    This infection is caused by germs (bacteria) in your genital area. These germs grow and cause swelling (inflammation) of your urinary tract.      What increases the risk?    The following factors may make you more likely to develop this condition:   Using a small, thin tube (catheter) to drain pee.     Not being able to control when you pee or poop (incontinence).     Being female. If you are female, these things can increase the risk:  ? Using these methods to prevent pregnancy:  ? A medicine that kills sperm (spermicide).    ? A device that blocks sperm (diaphragm).      ? Having low  levels of a female hormone (estrogen).    ? Being pregnant.        You are more likely to develop this condition if:   You have genes that add to your risk.     You are sexually active.     You take antibiotic medicines.      You have trouble peeing because of:  ? A prostate that is bigger than normal, if you are female.    ? A blockage in the part of your body that drains pee from the bladder.    ? A kidney stone.     ? A nerve condition that affects your bladder.    ? Not getting enough to drink.     ? Not peeing often enough.       You have other conditions, such as:  ? Diabetes.     ? A weak disease-fighting system (immune system).    ? Sickle cell disease.     ? Gout.     ? Injury of the spine.          What are the signs or symptoms?    Symptoms of this condition include:   Needing to pee right away.     Peeing small amounts often.     Pain or burning when peeing.     Blood in the pee.     Pee that smells bad or not like normal.     Trouble peeing.     Pee that is cloudy.     Fluid coming from the vagina, if you are female.     Pain in the belly or lower back.      Other symptoms include:   Vomiting.     Not feeling hungry.     Feeling mixed up (confused). This may be the first symptom in older adults.     Being tired and grouchy (irritable).     A fever.     Watery poop (diarrhea).        How is this treated?     Taking antibiotic medicine.     Taking other medicines.     Drinking enough water.    In some cases,  you may need to see a specialist.      Follow these instructions at home:      Medicines     Take over-the-counter and prescription medicines only as told by your doctor.     If you were prescribed an antibiotic medicine, take it as told by your doctor. Do not stop taking it even if you start to feel better.      General instructions     Make sure you:  ? Pee until your bladder is empty.     ? Do not hold pee for a long time.    ? Empty your bladder after sex.    ? Wipe from front to back after peeing  or pooping if you are a female. Use each tissue one time when you wipe.       Drink enough fluid to keep your pee pale yellow.     Keep all follow-up visits.        Contact a doctor if:     You do not get better after 1?2 days.     Your symptoms go away and then come back.      Get help right away if:     You have very bad back pain.     You have very bad pain in your lower belly.     You have a fever.     You have chills.     You feeling like you will vomit or you vomit.      Summary     A urinary tract infection (UTI) is an infection of any part of the urinary tract.     This condition is caused by germs in your genital area.     There are many risk factors for a UTI.     Treatment includes antibiotic medicines.     Drink enough fluid to keep your pee pale yellow.      This information is not intended to replace advice given to you by your health care provider. Make sure you discuss any questions you have with your health care provider.      Document Revised: 06/07/2020 Document Reviewed: 06/07/2020  Elsevier Patient Education ? 2021 Elsevier Inc.      ---------------------------------------------------------------------------------------------------------------------  [facility] allows patients to review your COVID and other test results as well as discharge documents from any [facility], Emergency Department, surgical center or outpatient lab. Test results are typically available 36 hours after the test is completed.     [facility] encourages you to self-enroll in the [facility] Patient Portal.     To begin your self-enrollment process, please visit [email or web address] Under [facility], click on "Sign up now".     NOTE: You must be 16 years and older to use [facility] Self-Enroll online. If you are a parent, caregiver, or guardian; you need an invite to access your child's or dependent's health records. To obtain an invite, contact the  Medical Records department at [phone] Monday through Friday, 8-4:30, select option 3 . If we receive your call afterhours, we will return your call the next business day.     If you have issues trying to create or access your account, contact [employer] support at [phone] available 7 days a week 24 hours a day.     Comment:

## 2021-08-26 NOTE — ED Notes (Signed)
ED Triage Note       ED Triage Adult Entered On:  08/26/2021 6:24 EDT    Performed On:  08/26/2021 6:22 EDT by Alger Simons, RN, Shary Decamp               Triage   Numeric Rating Pain Scale :   2   Chief Complaint :   pt reports dysuria and frequency that started last night.    [place] Mode of Arrival :   Private vehicle   Infectious Disease Documentation :   Document assessment   Temperature Oral :   36.3 degC(Converted to: 97.3 degF)    Heart Rate Monitored :   59 bpm (LOW)    Respiratory Rate :   18 br/min   Systolic Blood Pressure :   160 mmHg (HI)    Diastolic Blood Pressure :   81 mmHg   SpO2 :   98 %   Oxygen Therapy :   Room air   Patient presentation :   None of the above   Chief Complaint or Presentation suggest infection :   Yes   Weight Dosing :   59.1 kg(Converted to: 130 lb 5 oz)    Height :   165.1 cm(Converted to: 5 ft 5 in)    Body Mass Index Dosing :   22 kg/m2   Hoffer, RN, Shary Decamp - 08/26/2021 6:22 EDT   DCP GENERIC CODE   Tracking Acuity :   4   Tracking Group :   ED [address] Tracking Group   [place], RN, Shary Decamp 08/26/2021 6:22 EDT   ED General Section :   Document assessment   Pregnancy Status :   Patient denies   ED Allergies Section :   Document assessment   ED Reason for Visit Section :   Document assessment   ED Quick Assessment :   Patient appears awake, alert, oriented to baseline. Skin warm and dry. Moves all extremities. Respiration even and unlabored. Appears in no apparent distress.   Hoffer, RN, Shary Decamp - 08/26/2021 6:22 EDT   ID Risk Screen Symptoms   TB Symptom Screen :   No symptoms   Last 90 days COVID-19 ID :   No   C. diff Symptom/History ID :   Neither of the above   Hoffer, RN, Shary Decamp - 08/26/2021 6:22 EDT   Allergies   (As Of: 08/26/2021 06:24:36 EDT)   Allergies (Active)   No Known Medication Allergies  Estimated Onset Date:   Unspecified ; Created By:   Quentin Ore; Reaction Status:   Active ; Category:   Drug ; Substance:   No Known Medication  Allergies ; Type:   Allergy ; Updated By:   Quentin Ore; Reviewed Date:   08/26/2021 6:22 EDT        Psycho-Social   Last 3 mo, thoughts killing self/others :   Patient denies   Right click within box for Suspected Abuse policy link. :   None   Feels Safe Where Live :   Yes   ED Behavioral Activity Rating Scale :   4 - Quiet and awake (normal level of activity)   Hoffer, RN, Shary Decamp 08/26/2021 6:22 EDT   ED Reason for Visit   (As Of: 08/26/2021 06:24:36 EDT)   Diagnoses(Active)    Dysuria  Date:   08/26/2021 ; Diagnosis Type:   Reason For Visit ; Confirmation:   Complaint of ; Clinical  Dx:   Dysuria ; Classification:   Medical ; Clinical Service:   Non-Specified ; Code:   PNED ; Probability:   0 ; Diagnosis Code:   [id number]

## 2021-08-26 NOTE — ED Provider Notes (Signed)
Dysuria *ED        Patient:   Tabitha Fisher, Tabitha Fisher             MRN: [id number]            FIN: [id number]               Age:   60 years     Sex:  Female     DOB:  11-12-1960   Associated Diagnoses:   Acute UTI   Author:   Marzetta Board CHARLES-MD      History of Present Illness   The patient presents with dysuria and frequency.  The onset was 1  days ago.  The course/duration of symptoms is constant.  Radiating pain: none. The character of symptoms is burning.  The degree at onset was moderate.  The degree at present is moderate.  The exacerbating factor is none.  The relieving factor is none.  Risk factors consist of none.  Therapy today: none.  Associated symptoms: denies fever, denies chills, denies nausea, denies vomiting, denies abdominal pain, denies back pain, denies pelvic pain, denies hematuria, denies urethral discharge and denies rash.        Review of Systems   Constitutional symptoms:  Negative except as documented in HPI.   Skin symptoms:  Negative except as documented in HPI.   Eye symptoms:  Negative except as documented in HPI.   Respiratory symptoms:  Negative except as documented in HPI.   Cardiovascular symptoms:  Negative except as documented in HPI.   Gastrointestinal symptoms:  Negative except as documented in HPI.   Genitourinary symptoms:  Negative except as documented in HPI.   Musculoskeletal symptoms:  Negative except as documented in HPI.   Neurologic symptoms:  Negative except as documented in HPI.   Psychiatric symptoms:  Negative except as documented in HPI.             Additional review of systems information: All other systems reviewed and otherwise negative.      Past Medical/ Family/ Social History   Medical history: Reviewed as documented in chart.   Surgical history: Reviewed as documented in chart.   Family history: Not significant.   Social history: Reviewed as documented in chart.   Problem list:    No qualifying data available  .      Physical Examination               Vital  Signs   Oxygen saturation.   General:  Alert, no acute distress.    Skin:  Warm, dry, pink.    Head:  Normocephalic.   Neck:  Supple, trachea midline.    Eye:  Pupils are equal, round and reactive to light, extraocular movements are intact.    Ears, nose, mouth and throat:  Oral mucosa moist.   Cardiovascular:  Regular rate and rhythm.   Respiratory:  Lungs are clear to auscultation, respirations are non-labored.    Back:  Normal range of motion.   Musculoskeletal:  Normal ROM, no tenderness.    Chest wall   Gastrointestinal:  Soft, Nontender, Non distended, Normal bowel sounds, No organomegaly.    Neurological:  Alert and oriented to person, place, time, and situation, No focal neurological deficit observed, normal sensory observed, normal motor observed.    Lymphatics:  No lymphadenopathy.   Psychiatric:  Cooperative, appropriate mood & affect.       Medical Decision Making   Differential Diagnosis:  Urinary tract infection, cystitis, urethritis, vulvovaginitis.  Rationale:  60yo female with dysuria. Plan for ua and culture.   Results review:  All Results   08/26/2021 7:12 EDT ED Triage Note     08/26/2021 6:39 EDT ED Note-Physician Dysuria *ED (In Progress)   08/26/2021 6:22 EDT ED Triage Note     08/26/2021 6:13 EDT Consent Forms     08/26/2021 7:22 EDT UA Color Yellow     UA Appear Slightly Cloudy     UA Glucose Negative     UA Bili Negative     UA Ketones Negative     UA Spec Grav 1.020     UA Blood Large     UA pH 6.0     Protein U Trace     UA Urobilinogen 0.2 EU/dL     UA Nitrite Negative     UA Leuk Est Large     WBC U 11-20 /HPF     RBC U 3-5 /HPF     Sq Epi U Few /LPF     Bacteria U Few    08/26/2021 7:16 EDT Genitourinary Symptoms Dysuria, Urgency     Urine Color Yellow     Urine Description Cloudy     Skin Color General Usual for ethnicity     Skin Temperature Warm     Skin Moisture General Dry     Level of Consciousness - APVU Alert     Affect/Behavior Appropriate     Orientation Assessment Oriented  x 4     Respirations Unlabored     Respiratory Pattern Regular     Patient Airway Status Patent without support     Oxygen Therapy Room air     Systolic Blood Pressure 159 mmHg  HI     Diastolic Blood Pressure 80 mmHg     Heart Rate Monitored 60 bpm     Respiratory Rate 15 br/min     SpO2 100 %    08/26/2021 7:12 EDT Advanced Directives No     Hx of falling last 3 months ED Fall No     Patient confused of disoriented ED Fall No     Patient intoxicated or sedated ED Fall No     Patient have an impaired gait ED Fall No     Use a mobility assistance device ED Fall No     Patient altered elimination ED Fall No     [facility] ED Fall Score 0     COVID-19 Vaccine Status 2 Doses received     Barriers to Learning None evident    08/26/2021 6:24 EDT Body Mass Index est meas 21.68 kg/m2     Body Mass Index Measured 21.68 kg/m2    08/26/2021 6:22 EDT ED Behavioral Activity Rating Scale 4 - Quiet and awake (normal level of activity)     Pregnancy Status Patient denies     SIRS Possible Source of Infectionn Yes     SIRS Source of Infection None of the above     Last 3 mo, thoughts killing self/others Patient denies     Suspect or Concern for: None     Feels Safe Where Live Yes     Oxygen Therapy Room air     TB Symptom Screen ID No symptoms     C. diff Symptom/History ID Neither of the above     Last 90 days COVID-19 ID No     Height/Length Measured 165.1 cm     Weight Dosing 59.1 kg     Numeric Rating  Pain Scale 2     Systolic Blood Pressure 160 mmHg  HI     Diastolic Blood Pressure 81 mmHg     Temperature Oral 36.3 degC     Heart Rate Monitored 59 bpm  LOW     Respiratory Rate 18 br/min     SpO2 98 %    .   Notes:  Workup demonstrates bacteruria. Plan for treatment with cefdinir. Will refer to urology for recurrent UTI. .      Impression and Plan   Diagnosis   Acute UTI (ICD10-CM N39.0, Discharge, Medical)   Plan   Condition: Stable.    Disposition: Discharged: to home.    Prescriptions: Launch prescriptions    Pharmacy:  cefdinir 300 mg oral capsule (Prescribe): 300 mg, 1 caps, Oral, q12hr, for 7 days, 14 caps, 0 Refill(s).    Patient was given the following educational materials: Urinary Tract Infection, Adult, Easy-to-Read, Urinary Tract Infection, Adult, Easy-to-Read.    Limitations: Limited activity.    Follow up with: Return to Emergency Department , only if needed if you have any new or worsening symptoms, Return to Emergency Department, In: as needed.    Counseled: Patient.      Signature Line     Electronically Signed on 08/26/2021 08:44 AM EDT   ________________________________________________   Dorena Dew, MD, Thermon Leyland by: Dorena Dew, MD, Jesse Sans on 08/26/2021 08:44 AM EDT

## 2021-08-27 ENCOUNTER — Other Ambulatory Visit: Payer: Self-pay | Admitting: Internal Medicine

## 2021-08-27 LAB — CULTURE, URINE: FINAL REPORT: >100000

## 2021-08-27 NOTE — Telephone Encounter (Signed)
Last filled 06-30-21 #90 Last OV 06-30-21 Next OV 07-07-22 [facility]

## 2021-08-28 NOTE — ED Notes (Signed)
 ED Results Callback Log     Urine Culture  Collected: 08/26/2021 EC  Complete Body site:   Specimen Type: U CleanCatch 08/28/2021 08:53      08/28/2021 12:24 [place], RN, [place]) No further action required Urine culture came back positive, sensitive to cefazolin. Pt prescribedcefdinir

## 2021-09-11 ENCOUNTER — Telehealth: Payer: Self-pay

## 2021-09-11 NOTE — Chronic Care Management (AMB) (Signed)
    Chronic Care Management Pharmacy Assistant   Name: Molly Peters  MRN: [id number] DOB: [birth year]    Reason for Encounter: Reminder Call   Conditions to be addressed/monitored: HTN, HLD, and COPD    Medications: Outpatient Encounter Medications as of 09/11/2021  Medication Sig   albuterol (VENTOLIN HFA) 108 (90 Base) MCG/ACT inhaler INHALE 2 PUFFS BY MOUTH EVERY 6 HOURS ASNEEDED WHEEEZING/ SHORTNESS OF BREATH   ALPRAZolam (XANAX) 1 MG tablet TAKE 1/2 TO 1 TABLET BY MOUTH 3 TIMES DAILY AS NEEDED   CREON 24000-76000 units CPEP TAKE 2 CAPSULES BY MOUTH 3 TIMES A DAY AND 1 CAPSULE WITH SNACKS   cyanocobalamin (,VITAMIN B-12,) 1000 MCG/ML injection Inject 1 mL (1,000 mcg total) into the muscle every 30 (thirty) days for 12 doses.   fluticasone (FLONASE) 50 MCG/ACT nasal spray Place 2 sprays into both nostrils daily.   lamoTRIgine (LAMICTAL) 100 MG tablet Take 1 tablet (100 mg total) by mouth 2 (two) times daily.   losartan-hydrochlorothiazide (HYZAAR) 100-12.5 MG tablet TAKE 1 TABLET BY MOUTH ONCE DAILY   morphine 30 MG SUPP Place 30 mg rectally every 3 (three) hours as needed. Per [facility] ER visit due to neck pain.   ondansetron (ZOFRAN-ODT) 4 MG disintegrating tablet DISSOLVE 1 TABLET ON THE TONGUE EVERY 8 HOURS AS NEEDED FOR NAUSEA OR VOMIT   pantoprazole (PROTONIX) 40 MG tablet Take 1 tablet (40 mg total) by mouth 2 (two) times daily before a meal.   PARoxetine (PAXIL) 20 MG tablet Take 1 tablet (20 mg total) by mouth daily.   Syringe/Needle, Disp, (SYRINGE 3CC/25GX1") 25G X 1" 3 ML MISC 3 mLs by Does not apply route every 30 (thirty) days.   tiZANidine (ZANAFLEX) 2 MG tablet TAKE 1-2 TABLETS BY MOUTH EVERY 6 HOURS AS NEEDED FOR MUSCLE SPASMS   No facility-administered encounter medications on file as of 09/11/2021.   [name]  did not answer the telephone to remind her of her upcoming telephone visit with Debbora Dus on 09/16/21 at 10:00am. Patient was reminded to have  all medications, supplements and any blood glucose and blood pressure readings available for review at appointment. No voice mail on cellular or home phone.    Star Rating Drugs: Medication:  Last Fill: Day Supply Losartan/Hctz  05/30/21 [place], CPP notified  Avel Sensor, [place] Assistant [phone]  Total time spent for month CPA: 10 min

## 2021-09-16 ENCOUNTER — Ambulatory Visit (INDEPENDENT_AMBULATORY_CARE_PROVIDER_SITE_OTHER): Payer: Medicare HMO

## 2021-09-16 ENCOUNTER — Telehealth: Payer: Self-pay

## 2021-09-16 ENCOUNTER — Other Ambulatory Visit: Payer: Self-pay

## 2021-09-16 DIAGNOSIS — G2581 Restless legs syndrome: Secondary | ICD-10-CM

## 2021-09-16 DIAGNOSIS — I10 Essential (primary) hypertension: Secondary | ICD-10-CM

## 2021-09-16 DIAGNOSIS — J449 Chronic obstructive pulmonary disease, unspecified: Secondary | ICD-10-CM

## 2021-09-16 NOTE — Patient Instructions (Signed)
Dear Molly Peters,  Below is a summary of the goals we discussed during our follow up appointment on September 16, 2021. Please contact me anytime with questions or concerns.   Visit Information  Patient Care Plan: CCM Pharmacy Care Plan     Problem Identified: Disease Management   Priority: High  Onset Date: 06/10/2021  Note:   Current Barriers:  Cost - Spiriva and Creon   Pharmacist Clinical Goal(s):  Patient will contact provider office for questions/concerns as evidenced notation of same in electronic health record through collaboration with PharmD and provider.   Interventions: 1:1 collaboration with Venia Carbon, MD regarding development and update of comprehensive plan of care as evidenced by provider attestation and co-signature Inter-disciplinary care team collaboration (see longitudinal plan of care) Comprehensive medication review performed; medication list updated in electronic medical record  Hypertension  (BP goal <140/90) -Controlled - last PCP visit BP was within goal  -Current treatment: Losartan-HCTZ 100- 12.5 mg - 1 tablet daily -Medications previously tried: none  -Current home readings: none reported, she has a fingertip monitor but I encouraged her not to use this -From initial visit 06/10/21 -Exercise - limited due to COPD, back pain. She tries to work out on the [employer] at night.  -Denies hypotensive/hypertensive symptoms -Educated on BP goals and benefits of medications for prevention of heart attack, stroke and kidney damage -Recommended to continue current medication; Consider purchasing a home BP monitor with arm cuff.  Hyperlipidemia: (LDL goal < 100) -Unable to assess, last lipid panel 2015 and patient was on simvastatin at this time. Reason for d/c not documented. Lipid panel not updated at recent wellness visit. -Current treatment: None -Medications previously tried:  simvastatin 20 mg - no adverse effects documented -Recommended update  lipid panel; Patient does not like to drive/gas money so we will wait until next lab visit.   COPD (Goal: control symptoms and prevent exacerbations) -Not ideally controlled, pt tried Spiriva last month but stopped due to cost. She noticed some improvement with Spiriva. -Current treatment  Albuterol - PRN -Medications previously tried: Advair, Symbicort - unknown, Spiriva DPI  -Initial pulmonology consult completed 08/23/20, Spiriva started. Pt completed PFTs but no follow up since then. PFTs considered normal. Pt has not scheduled follow up. -Gold Grade: Gold 1 (FEV1>80%) -Current COPD Classification:  B (high sx, <2 exacerbations/yr) -MMRC/CAT score: none -Pulmonary function testing: 09/2020 -Exacerbations requiring treatment in last 6 months: none Tobacco: -Current smoker - 3/4 PPD -Quit history -she quit for 15 months with nicotine patches/inhaler, went back to work and coworkers smoked. -Patient denies consistent use of maintenance inhaler -Frequency of rescue inhaler use: several times/day, she has difficulty with daily activities such as cleaning due to SOB -Counseled on Benefits of consistent maintenance inhaler use -Recommended to continue current medication; Schedule f/u with pulmonology. Will start Spiriva PAP. Mail forms.  Depression/Anxiety (Goal: Improve mood) -Controlled - per patient report (Not discussed 09/16/21) -Current treatment: Paroxetine 20 mg - 1 tablet daily Xanax 1 mg - 1/2-1 tablet TID PRN (pt takes 1 tablet BID) -Medications previously tried/failed: none -PHQ9: 2 (2019) -Family stressors but overall stable, doing well on current therapy -Recommended to continue current medication  Osteoarthritis (Goal: Improve pain level) -Not ideally controlled - per patient report -Pt went to ED twice in August for neck pain - still taking methocarbamol and morphine sparingly PRN. -She reports h/o both shoulders replaced, knee surgery, 3 back surgeries and ruptured  disc -She has stopped NSAIDs due to PUD. -  Current treatment  Voltaren gel 1% - apply PRN (used twice so far) Tylenol Arthritis Strength - as needed (reports ineffective) -Medications previously tried: multiple -We discussed scheduling Tylenol and rotating with Voltaren gel.  -Recommended to continue current medication; Try scheduling Tylenol 500 mg-1000 mg TID. Use Voltaren gel 2-4 times daily in between use of Tylenol.  Seizures (Goal: Seizure free) -Controlled - pt reports seizure free about 15 years (Not discussed 09/16/21) -Does not drive much, but able -Current treatment  Lamotrigine 100 mg - 1 tablet twice daily -Medications previously tried: yes, but unknown  -Recommended to continue current medication  Iron Deficiency (Goal: Iron panel WNL) -Managed by hematology/oncology (Not discussed 09/16/21) -She takes oral iron and gets infusions -Current treatment  Iron 65 mg - 1 tablet every day -Medications previously tried: none reported  -Recommended to continue current medication  Other -  B12 injections monthly (self-injects) Protonix BID for PUD (pt takes PRN) Creon - 2 before meals,1  before snacks (not taking) PRN - Zofran  Patient Goals/Self-Care Activities Patient will:  - work with provider on medication access solutions   Follow Up Plan: Telephone follow up appointment with care management team member scheduled for:  1 month      Patient verbalizes understanding of instructions provided today and agrees to view in [place].   Debbora Dus, PharmD Clinical Pharmacist [facility] [phone]

## 2021-09-16 NOTE — Telephone Encounter (Addendum)
Patient would like to apply for Spiriva patient assistance through [facility] (no active prescription - leave this part of form blank) and Creon DR capsules through [employer]. Please complete forms and send to me for review.  Debbora Dus, PharmD Clinical Pharmacist [facility] [phone]

## 2021-09-16 NOTE — Progress Notes (Signed)
Chronic Care Management Pharmacy Note  09/16/2021 Name:  Molly Peters MRN:  [id number] DOB:  Dec 09, 1960  Summary: - Patient unable to afford Spiriva. She did notice some improvement with Spiriva but stopped taking due to cost. She has not schedule pulmonology follow up. She continues albuterol inhaler PRN most days. - Stopped Creon about 2 weeks ago. Unable to afford.  - She also used to be on statin and had not lipid panel updated since 2015. This was not updated at recent PCP visit.  -She had 2 visits to ED for neck pain since last CCM  -She continues all other meds as prescribed   Recommendations/Changes made from today's visit: -Order lipid panel with next labs   Plan: -Start Spiriva PAP -Check on cost assistance for Creon -Patient to follow up with [name] in 1 month    Subjective: Molly Peters is an 60 y.o. year old female who is a primary patient of Venia Carbon, MD.  The CCM team was consulted for assistance with disease management and care coordination needs.    Engaged with patient by telephone for follow up visit in response to provider referral for pharmacy case management and/or care coordination services.   Consent to Services:  The patient was given information about Chronic Care Management services, agreed to services, and gave verbal consent prior to initiation of services.  Please see initial visit note for detailed documentation.   Patient Care Team: Venia Carbon, MD as PCP - General Virgel Manifold, MD as Consulting Physician (Gastroenterology) Vickie Epley, MD (Inactive) as Consulting Physician (General Surgery) Debbora Dus, [facility] as Pharmacist (Pharmacist)  Recent office visits:  06/30/21 - PCP - Pt presented for routine medical visit. COPD, She was unable to afford Spiriva. Continues albuterol. HTN, good today. Malnutrition, add Ensure for skipped meals. Seizure free on Lamictal. Mood disorder stable on Xanax,  paroxetine. RTC 1 year.   Recent consult visits:  08/06/2021 - Oncology - Patient presented for Iron Deficiency. Labs: CBC, Ferritin, Iron and TIBC, Vit B. Start: cyanocobalamin (,VITAMIN B-12,) 1000 MCG/ML injection. 07/01/2021 - [place] - Patient presented for Iron Deficiency; [place]. Procedures: PR VITAMIN B12 INJECTION and PR Jake Michaelis, IM [place] [facility] visits:  07/12/2021 [facility] Emergency Department - Patient presented for neck pain. Start: Methocarbamol 533m and Morphine 361m Discharged same day.  [facility] Patient presented for Cervicalgia. Procedures: PR INJECTION,THERAP/PROPH/DIAGNOST, IM OR SUBCUT and PR KETOROLAC TROMETHAMINE INJ. Discharged same day.      Objective:  Lab Results  Component Value Date   CREATININE 0.78 08/06/2021   BUN 13 08/06/2021   GFR 69.90 06/30/2021   GFRNONAA >60 08/06/2021   GFRAA >60 08/12/2020   NA 137 08/06/2021   K 3.4 (L) 08/06/2021   CALCIUM 9.4 08/06/2021   CO2 26 08/06/2021   GLUCOSE 98 08/06/2021    Lab Results  Component Value Date/Time   GFR 69.90 06/30/2021 03:28 PM   GFR 55.36 (L) 06/20/2019 11:41 AM    Lab Results  Component Value Date   CHOL 148 04/05/2014   HDL 42.70 04/05/2014   LDLCALC 82 04/05/2014   LDLDIRECT 155.7 10/10/2009   TRIG 116.0 04/05/2014   CHOLHDL 3 04/05/2014    Hepatic Function Latest Ref Rng & Units 08/06/2021 06/30/2021 01/27/2021  Total Protein 6.5 - 8.1 g/dL 7.9 7.1 8.3(H)  Albumin 3.5 - 5.0 g/dL 4.2 4.2 4.6  AST 15 - 41 U/L 23 18  26  ALT 0 - 44 U/L _0 Alk Phosphatase 38 - 126 U/L 87 74 72  Total Bilirubin 0.3 - 1.2 mg/dL 0.7 0.5 0.5  Bilirubin, Direct 0.0 - 0.3 mg/dL - - -    Lab Results  Component Value Date/Time   TSH 1.256 07/05/2018 12:35 PM   TSH 0.30 (L) 04/04/2014 01:02 PM   TSH 0.43 04/05/2013 11:50 AM   FREET4 0.77 06/30/2021 03:28 PM   FREET4 0.67 01/26/2020 01:07 PM    CBC Latest  Ref Rng & Units 08/06/2021 06/30/2021 03/06/2021  WBC 4.0 - 10.5 K/uL 7.7 7.5 9.2  Hemoglobin 12.0 - 15.0 g/dL 14.8 15.1(H) 14.4  Hematocrit 36.0 - 46.0 % 41.3 42.9 41.7  Platelets 150 - 400 K/uL 355 394.0 307    Lab Results  Component Value Date/Time   VD25OH 10.5 (L) 02/18/2016 02:16 PM   VD25OH - Total on 03/12/20 38 (Gastroenterology)  Clinical ASCVD: Yes  The ASCVD Risk score (Arnett DK, et al., 2019) failed to calculate for the following reasons:   Cannot find a previous HDL lab   Cannot find a previous total cholesterol lab    Depression screen [facility] 2/9 06/30/2021 06/25/2020 06/20/2019  Decreased Interest 0 0 0  Down, Depressed, Hopeless 0 1 0  PHQ - 2 Score 0 1 0  Some recent data might be hidden    Social History   Tobacco Use  Smoking Status Every Day   Packs/day: 1.00   Years: 42.75   Pack years: 42.75   Types: Cigarettes  Smokeless Tobacco Never   BP Readings from Last 3 Encounters:  08/06/21 (!) 158/104  06/30/21 128/82  06/02/21 (!) 154/91   Pulse Readings from Last 3 Encounters:  08/06/21 72  06/30/21 68  06/02/21 77   Wt Readings from Last 3 Encounters:  08/06/21 94 lb 5.7 oz (42.8 kg)  06/30/21 93 lb 12 oz (42.5 kg)  06/02/21 94 lb 6.4 oz (42.8 kg)   BMI Readings from Last 3 Encounters:  08/06/21 18.74 kg/m  06/30/21 18.62 kg/m  06/02/21 18.44 kg/m    Assessment/Interventions: Review of patient past medical history, allergies, medications, health status, including review of consultants reports, laboratory and other test data, was performed as part of comprehensive evaluation and provision of chronic care management services.   SDOH:  (Social Determinants of Health) assessments and interventions performed: Yes SDOH Interventions    Flowsheet Row Most Recent Value  SDOH Interventions   Financial Strain Interventions Other (Comment)  [Discussed Spiriva assistance options]       SDOH Screenings   Alcohol Screen: Not on file  Depression  (PHQ2-9): Low Risk    PHQ-2 Score: 0  Financial Resource Strain: Medium Risk   Difficulty of Paying Living Expenses: Somewhat hard  Food Insecurity: Not on file  Housing: Not on file  Physical Activity: Not on file  Social Connections: Not on file  Stress: Not on file  Tobacco Use: High Risk   Smoking Tobacco Use: Every Day   Smokeless Tobacco Use: Never   Passive Exposure: Not on file  Transportation Needs: Not on file    CCM Care Plan  Allergies  Allergen Reactions   Carbamazepine Hives   Divalproex Sodium Swelling and Other (See Comments)    HAIR FALLS OUT   Clonazepam Other (See Comments)    HALLUCINATIONS   Diphenhydramine Hcl Rash   Tiotropium Bromide Monohydrate Other (See Comments)    CREATES YEAST IN THROAT   Vicodin [Hydrocodone-Acetaminophen]  Itching    Medications Reviewed Today     Reviewed by Debbora Dus, [facility] (Pharmacist) on 09/16/21 at 1058  Med List Status: <None>   Medication Order Taking? Sig Documenting Provider Last Dose Status Informant  albuterol (VENTOLIN HFA) 108 (90 Base) MCG/ACT inhaler [id number] Yes INHALE 2 PUFFS BY MOUTH EVERY 6 HOURS ASNEEDED WHEEEZING/ SHORTNESS OF BREATH Venia Carbon, MD Taking Active   ALPRAZolam Duanne Moron) 1 MG tablet [id number] Yes TAKE 1/2 TO 1 TABLET BY MOUTH 3 TIMES DAILY AS NEEDED Venia Carbon, MD Taking Active   CREON 24000-76000 units CPEP [id number] No TAKE 2 CAPSULES BY MOUTH 3 TIMES A DAY AND 1 CAPSULE WITH SNACKS  Patient not taking: No sig reported   Virgel Manifold, MD Not Taking Active   cyanocobalamin (,VITAMIN B-12,) 1000 MCG/ML injection [id number] Yes Inject 1 mL (1,000 mcg total) into the muscle every 30 (thirty) days for 12 doses. Sindy Guadeloupe, MD Taking Active   fluticasone Asencion Islam) 50 MCG/ACT nasal spray [id number] Yes Place 2 sprays into both nostrils daily. [provider] Taking Active   lamoTRIgine (LAMICTAL) 100 MG tablet [id number] Yes Take 1 tablet (100 mg total) by mouth  2 (two) times daily. Venia Carbon, MD Taking Active   losartan-hydrochlorothiazide [facility]) 100-12.5 MG tablet [id number] Yes TAKE 1 TABLET BY MOUTH ONCE DAILY Venia Carbon, MD Taking Active   methocarbamol (ROBAXIN) 500 MG tablet [id number] Yes Take 1,000 mg by mouth 3 (three) times daily as needed for muscle spasms. For 7 days (21 tablets prescribed at ED 07/12/21) [provider] Taking Active Self  morphine (MSIR) 30 MG tablet [id number] Yes Take 30 mg by mouth every 4 (four) hours as needed for severe pain. Up to 5 days (prescribed at ED 07/12/21) [provider] Taking Active Self  ondansetron (ZOFRAN-ODT) 4 MG disintegrating tablet [id number] Yes DISSOLVE 1 TABLET ON THE TONGUE EVERY 8 HOURS AS NEEDED FOR NAUSEA OR VOMIT Venia Carbon, MD Taking Active   pantoprazole (PROTONIX) 40 MG tablet [id number] Yes Take 1 tablet (40 mg total) by mouth 2 (two) times daily before a meal. [name] B, MD Taking Active   PARoxetine (PAXIL) 20 MG tablet [id number] Yes Take 1 tablet (20 mg total) by mouth daily. Venia Carbon, MD Taking Active   Syringe/Needle, Disp, (SYRINGE 3CC/25GX1") 25G X 1" 3 ML MISC [id number] Yes 3 mLs by Does not apply route every 30 (thirty) days. Sindy Guadeloupe, MD Taking Active   Med List Note Dewayne Shorter, RN 01/29/16 1519): UDS 01-29-16            Patient Active Problem List   Diagnosis Date Noted   Benzodiazepine dependence ([place]) 06/30/2021   Acute gastric ulcer without hemorrhage or perforation    Acute gastric erosion    Malnutrition of mild degree ([place]) 06/25/2020   Personal history of colonic polyps    Diverticulosis of large intestine without diverticulitis    Pulmonary nodules 08/08/2018   Iron deficiency anemia due to chronic blood loss 07/05/2018   Polyp of colon    Acute peptic ulcer of stomach    Advance directive discussed with patient 10/27/2017   Neuropathy 08/11/2016   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   S/P Bilateral Total Shoulder Replacement (2016) 11/23/2014   Osteoarthritis of shoulder (Right)  04/13/2014   Routine general medical examination at a health care facility 04/05/2013   Restless legs syndrome (RLS) 10/21/2010   Essential hypertension, benign 06/23/2010   HYPERLIPIDEMIA-MIXED 10/23/2009   Valvular heart disease 03/27/2009   IRRITABLE BOWEL SYNDROME 11/13/2008   Mood disorder ([place]) 09/07/2007   Tobacco use disorder 09/07/2007   GLAUCOMA 09/07/2007   HEMORRHOIDS 09/07/2007   Esophagogastric ulcer 09/07/2007   COPD (chronic obstructive pulmonary disease) with chronic bronchitis ([place]) 08/08/2007   GERD 08/08/2007   Seizure disorder ([place]) 08/08/2007    Immunization History  Administered Date(s) Administered   Influenza Split 09/28/2011   Influenza Whole 10/03/2007, 09/20/2009, 10/21/2010   Influenza, Quadrivalent, Recombinant, Inj, Pf 08/23/2020   Influenza,inj,Quad PF,6+ Mos 08/11/2016, 10/27/2017   Pneumococcal Conjugate-13 10/27/2017   Pneumococcal Polysaccharide-23 10/09/2009   Td 01/09/2003    Conditions to be addressed/monitored:  Hypertension, Hyperlipidemia, GERD, COPD, Anxiety, Osteoarthritis, Tobacco use, and Iron deficiency  Care Plan : [place]  Updates made by Debbora Dus, [facility] since 09/16/2021 12:00 AM     Problem: Disease Management   Priority: High  Onset Date: 06/10/2021  Note:   Current Barriers:  Cost - Spiriva and Creon   Pharmacist Clinical Goal(s):  Patient will contact provider office for questions/concerns as evidenced notation of same in electronic health record through collaboration with PharmD and provider.   Interventions: 1:1 collaboration with Venia Carbon, MD regarding development and update of comprehensive plan of care as evidenced by provider  attestation and co-signature Inter-disciplinary care team collaboration (see longitudinal plan of care) Comprehensive medication review performed; medication list updated in electronic medical record  Hypertension  (BP goal <140/90) -Controlled - last PCP visit BP was within goal  -Current treatment: Losartan-HCTZ 100- 12.5 mg - 1 tablet daily -Medications previously tried: none  -Current home readings: none reported, she has a fingertip monitor but I encouraged her not to use this -From initial visit 06/10/21 -Exercise - limited due to COPD, back pain. She tries to work out on the [employer] at night.  -Denies hypotensive/hypertensive symptoms -Educated on BP goals and benefits of medications for prevention of heart attack, stroke and kidney damage -Recommended to continue current medication; Consider purchasing a home BP monitor with arm cuff.  Hyperlipidemia: (LDL goal < 100) -Unable to assess, last lipid panel 2015 and patient was on simvastatin at this time. Reason for d/c not documented. Lipid panel not updated at recent wellness visit. -Current treatment: None -Medications previously tried:  simvastatin 20 mg - no adverse effects documented -Recommended update lipid panel; Patient does not like to drive/gas money so we will wait until next lab visit.   COPD (Goal: control symptoms and prevent exacerbations) -Not ideally controlled, pt tried Spiriva last month but stopped due to cost. She noticed some improvement with Spiriva. -Current treatment  Albuterol - PRN -Medications previously tried: Advair, Symbicort - unknown, Spiriva DPI  -Initial pulmonology consult completed 08/23/20, Spiriva started. Pt completed PFTs but no follow up since then. PFTs considered normal. Pt has not scheduled follow up. -Gold Grade: Gold 1 (FEV1>80%) -Current COPD Classification:  B (high sx, <2 exacerbations/yr) -MMRC/CAT score: none -Pulmonary function testing: 09/2020 -Exacerbations requiring  treatment in last 6 months: none Tobacco: -Current smoker - 3/4 PPD -Quit history -she quit for 15 months with nicotine patches/inhaler, went back to work and coworkers smoked. -Patient denies consistent use of maintenance inhaler -Frequency of rescue inhaler use: several times/day, she has difficulty with daily activities such as cleaning due to  SOB -Counseled on Benefits of consistent maintenance inhaler use -Recommended to continue current medication; Schedule f/u with pulmonology. Will start Spiriva PAP. Mail forms.  Depression/Anxiety (Goal: Improve mood) -Controlled - per patient report (Not discussed 09/16/21) -Current treatment: Paroxetine 20 mg - 1 tablet daily Xanax 1 mg - 1/2-1 tablet TID PRN (pt takes 1 tablet BID) -Medications previously tried/failed: none -PHQ9: 2 (2019) -Family stressors but overall stable, doing well on current therapy -Recommended to continue current medication  Osteoarthritis (Goal: Improve pain level) -Not ideally controlled - per patient report -Pt went to ED twice in August for neck pain - still taking methocarbamol and morphine sparingly PRN. -She reports h/o both shoulders replaced, knee surgery, 3 back surgeries and ruptured disc -She has stopped NSAIDs due to PUD. -Current treatment  Voltaren gel 1% - apply PRN (used twice so far) Tylenol Arthritis Strength - as needed (reports ineffective) -Medications previously tried: multiple -We discussed scheduling Tylenol and rotating with Voltaren gel.  -Recommended to continue current medication; Try scheduling Tylenol 500 mg-1000 mg TID. Use Voltaren gel 2-4 times daily in between use of Tylenol.  Seizures (Goal: Seizure free) -Controlled - pt reports seizure free about 15 years (Not discussed 09/16/21) -Does not drive much, but able -Current treatment  Lamotrigine 100 mg - 1 tablet twice daily -Medications previously tried: yes, but unknown  -Recommended to continue current medication  Iron  Deficiency (Goal: Iron panel WNL) -Managed by hematology/oncology (Not discussed 09/16/21) -She takes oral iron and gets infusions -Current treatment  Iron 65 mg - 1 tablet every day -Medications previously tried: none reported  -Recommended to continue current medication  Other -  B12 injections monthly (self-injects) Protonix BID for PUD (pt takes PRN) Creon - 2 before meals,1  before snacks (not taking) PRN - Zofran  Patient Goals/Self-Care Activities Patient will:  - work with provider on medication access solutions   Follow Up Plan: Telephone follow up appointment with care management team member scheduled for:  1 month     Medication Assistance: Application for Spiriva  medication assistance program. in process.  Anticipated assistance start date TBD - mailing to patient this week 09/16/21.  See plan of care for additional detail.  Compliance/Adherence/Medication fill history: Care Gaps: Mammogram due - October 2022  Star-Rating Drugs: Medication:                Last Fill:         Day Supply Losartan/Hctz              05/30/21            90  Losartan past due for refill, pt confirms daily adherence   Patient's preferred pharmacy is: [facility], [place] [place] [address] Phone: [phone] Fax: [phone]  Uses pill box? No - denies need for it Pt endorses 100% compliance  We discussed: Benefits of medication synchronization, packaging and delivery as well as enhanced pharmacist oversight with [employer]. Patient decided to: Continue current medication management strategy  Care Plan and Follow Up Patient Decision:  Patient agrees to Care Plan and Follow-up.  Debbora Dus, PharmD Clinical Pharmacist [facility] at [facility] [phone]

## 2021-09-29 ENCOUNTER — Other Ambulatory Visit: Payer: Self-pay | Admitting: Internal Medicine

## 2021-09-29 NOTE — Telephone Encounter (Signed)
Last filled 08-27-21 #90 Last OV 06-30-21 Next OV 07-07-22 [facility]

## 2021-10-08 DIAGNOSIS — I10 Essential (primary) hypertension: Secondary | ICD-10-CM | POA: Diagnosis not present

## 2021-10-08 DIAGNOSIS — J449 Chronic obstructive pulmonary disease, unspecified: Secondary | ICD-10-CM | POA: Diagnosis not present

## 2021-10-08 DIAGNOSIS — F17210 Nicotine dependence, cigarettes, uncomplicated: Secondary | ICD-10-CM

## 2021-10-08 NOTE — Telephone Encounter (Signed)
Amy, can you check to see if patient has received both PAP applications and follow up on next steps 12/7?  Debbora Dus, PharmD Clinical Pharmacist Practitioner [facility] at [facility] [phone]

## 2021-10-08 NOTE — Progress Notes (Signed)
Patient assistance forms for Spiriva and Creon mailed to patient with instructions.  Debbora Dus, CPP notified  Avel Sensor, [place] Assistant [phone]  Total time spent for month CPA: 10 min.

## 2021-10-10 ENCOUNTER — Ambulatory Visit (INDEPENDENT_AMBULATORY_CARE_PROVIDER_SITE_OTHER): Payer: Medicare HMO | Admitting: Internal Medicine

## 2021-10-10 ENCOUNTER — Other Ambulatory Visit: Payer: Self-pay

## 2021-10-10 ENCOUNTER — Encounter: Payer: Self-pay | Admitting: Internal Medicine

## 2021-10-10 DIAGNOSIS — L509 Urticaria, unspecified: Secondary | ICD-10-CM | POA: Diagnosis not present

## 2021-10-10 MED ORDER — PREDNISONE 20 MG PO TABS: 40.0000 mg | ORAL_TABLET | Freq: Every day | ORAL | 0 refills | Status: DC

## 2021-10-10 NOTE — Assessment & Plan Note (Signed)
Nothing visible now--but sounds like papular urticaria  Has gained weight and recent CT of abd/pelvis/lung all fine---so reassuring that there is no underlying process causing this Will try prednisone burst Cetirizine or fexofenadine Next step is allergy evaluation

## 2021-10-10 NOTE — Patient Instructions (Signed)
Please try cetirizine 10mg  or fexofenadine 180mg  daily at bedtime. If the prednisone doesn't help the rash, set up an appointment with an allergist

## 2021-10-10 NOTE — Progress Notes (Signed)
Subjective:    Patient ID: Molly Peters, female    DOB: 05-14-1961, 60 y.o.   MRN: [id number]  HPI Here with husband due to rash This visit occurred during the SARS-CoV-2 public health emergency.  Safety protocols were in place, including screening questions prior to the visit, additional usage of staff PPE, and extensive cleaning of exam room while observing appropriate contact time as indicated for disinfecting solutions.   Has been "breaking out for 6 weeks" Moves from one spot to another Digs at her skin Head is itchy Awakened by the itching  "Looks like little whelps" per husband  4 boxes of benedryl OTC hydrocortisone Allergy pills--not sure which  Seems to get worse in the heat--has to cool off   Current Outpatient Medications on File Prior to Visit  Medication Sig Dispense Refill   albuterol (VENTOLIN HFA) 108 (90 Base) MCG/ACT inhaler INHALE 2 PUFFS BY MOUTH EVERY 6 HOURS ASNEEDED WHEEEZING/ SHORTNESS OF BREATH 8.5 g 1   ALPRAZolam (XANAX) 1 MG tablet TAKE 1/2 TO 1 TABLET BY MOUTH 3 TIMES DAILY AS NEEDED 90 tablet 0   cyanocobalamin (,VITAMIN B-12,) 1000 MCG/ML injection Inject 1 mL (1,000 mcg total) into the muscle every 30 (thirty) days for 12 doses. 12 mL 0   fluticasone (FLONASE) 50 MCG/ACT nasal spray Place 2 sprays into both nostrils daily.     lamoTRIgine (LAMICTAL) 100 MG tablet Take 1 tablet (100 mg total) by mouth 2 (two) times daily. 180 tablet 3   losartan-hydrochlorothiazide (HYZAAR) 100-12.5 MG tablet TAKE 1 TABLET BY MOUTH ONCE DAILY 90 tablet 3   methocarbamol (ROBAXIN) 500 MG tablet Take 1,000 mg by mouth 3 (three) times daily as needed for muscle spasms. For 7 days (21 tablets prescribed at ED 07/12/21)     morphine (MSIR) 30 MG tablet Take 30 mg by mouth every 4 (four) hours as needed for severe pain. Up to 5 days (prescribed at ED 07/12/21)     ondansetron (ZOFRAN-ODT) 4 MG disintegrating tablet DISSOLVE 1 TABLET ON THE TONGUE EVERY 8 HOURS AS NEEDED  FOR NAUSEA OR VOMIT 30 tablet 0   pantoprazole (PROTONIX) 40 MG tablet Take 1 tablet (40 mg total) by mouth 2 (two) times daily before a meal. 180 tablet 0   PARoxetine (PAXIL) 20 MG tablet Take 1 tablet (20 mg total) by mouth daily. 90 tablet 3   Syringe/Needle, Disp, (SYRINGE 3CC/25GX1") 25G X 1" 3 ML MISC 3 mLs by Does not apply route every 30 (thirty) days. 12 each 0   No current facility-administered medications on file prior to visit.    Allergies  Allergen Reactions   Carbamazepine Hives   Divalproex Sodium Swelling and Other (See Comments)    HAIR FALLS OUT   Clonazepam Other (See Comments)    HALLUCINATIONS   Diphenhydramine Hcl Rash   Tiotropium Bromide Monohydrate Other (See Comments)    CREATES YEAST IN THROAT   Vicodin [Hydrocodone-Acetaminophen] Itching    Past Medical History:  Diagnosis Date   Anxiety    Cervical cancer ([place]) 1980's   Chronic back pain 11/12/2009   Qualifier: Diagnosis of  By: Maxie Better FNP, Rosalita Levan    COPD (chronic obstructive pulmonary disease) ([place])    states SOB with ADLs; no O2 use; able to speak in complete sentences without SOB(11/15/2014)   Depression    Difficulty swallowing pills    s/p cervical fusion   Family history of adverse reaction to anesthesia    pt's mother and sister  have hx. of post-op N/V   Fibromyalgia    GERD (gastroesophageal reflux disease)    Heart murmur    History of gastric ulcer    Hypertension    states under control with med., has been on med. x 1-2 yr.   IBS (irritable bowel syndrome)    Internal hemorrhoid    states has had intermittent bright red bleeding with BM (11/15/2014)   Intraductal papilloma of breast, right 08/08/2018   Limited joint range of motion    neck - s/p cervical fusion   Localized primary osteoarthritis of right shoulder region 11/23/2014   Osteoarthritis of left shoulder 07/05/2015   Osteoarthritis of right shoulder 11/2014   Pneumonia    Seizures ([place])    last seizure 2010    Short-term memory loss    TMJ syndrome    Valvular heart disease    Initial workup a number of years ago at [facility].  Echo (10/2009) showed EF 60-65%,      normal LV size, moderate aortic insufficiency with a trileaflet aortic valve and normal aortic root size.  Mild      mitral regurgitation.    Wears dentures    lower    Past Surgical History:  Procedure Laterality Date   ABDOMINAL HYSTERECTOMY     partial   ANTERIOR CERVICAL DECOMP/DISCECTOMY FUSION  02/06/2011   C4-5, C5-6, C6-7   BREAST BIOPSY Bilateral 10+ yrs ago   neg   BREAST BIOPSY Right 08/03/2018   2 areas bx, -neg   BREAST LUMPECTOMY Bilateral 1989   x 3 - benign   BREAST LUMPECTOMY Right 08/24/2018   Procedure: BREAST LUMPECTOMY WITH ULTRASOUND IN OR;  Surgeon: Vickie Epley, MD;  Location: [facility];  Service: General;  Laterality: Right;   [place]     x 2   COLONOSCOPY  09/28/2008   COLONOSCOPY N/A 06/15/2018   Procedure: COLONOSCOPY;  Surgeon: Virgel Manifold, MD;  Location: [facility];  Service: Endoscopy;  Laterality: N/A;   COLONOSCOPY WITH PROPOFOL N/A 05/04/2019   Procedure: COLONOSCOPY WITH PROPOFOL;  Surgeon: Virgel Manifold, MD;  Location: [facility];  Service: Endoscopy;  Laterality: N/A;   COLONOSCOPY WITH PROPOFOL N/A 04/21/2021   Procedure: COLONOSCOPY WITH PROPOFOL;  Surgeon: Virgel Manifold, MD;  Location: [facility];  Service: Endoscopy;  Laterality: N/A;   ESOPHAGOGASTRODUODENOSCOPY  09/28/2008   ESOPHAGOGASTRODUODENOSCOPY N/A 06/14/2018   Procedure: ESOPHAGOGASTRODUODENOSCOPY (EGD);  Surgeon: Virgel Manifold, MD;  Location: [facility] END[facility]COPY;  Service: Endoscopy;  Laterality: N/A;   ESOPHAGOGASTRODUODENOSCOPY (EGD) WITH PROPOFOL N/A 05/04/2019   Procedure: ESOPHAGOGASTRODUODENOSCOPY (EGD) WITH PROPOFOL;  Surgeon: Virgel Manifold, MD;  Location: [facility] END[facility];  Service: Endoscopy;  Laterality: N/A;   ESOPHAGOGASTRODUODENOSCOPY  (EGD) WITH PROPOFOL N/A 03/18/2020   Procedure: ESOPHAGOGASTRODUODENOSCOPY (EGD) WITH PROPOFOL;  Surgeon: Lin Landsman, MD;  Location[facility] ENDOSCOPY;  Service: Gastroenterology;  Laterality: N/A;   ESOPHAGOGASTRODUODENOSCOPY (EGD) WITH PROPOFOL N/A 04/21/2021   Procedure: ESOPHAGOGASTRODUODENOSCOPY (EGD) WITH PROPOFOL;  Surgeon: Virgel Manifold, MD;  Location: [facility] ENDOSCOPY;  Service: Endoscopy;  Laterality: N/A;   HARDWARE REMOVAL  11/17/2006   L5-S1   LAMINECTOMY WITH POSTERIOR LATERAL ARTHRODESIS LEVEL 3  12/06/2009   with synovial cyst resection L3-4 bilat.   LAMINECTOMY WITH POSTERIOR LATERAL ARTHRODESIS LEVEL 3  11/17/2006   L4-5   NM MYOVIEW LTD     Lexiscan myoview (10/2009): EF 77%, normal wall motion, normal perfusion.    SHOULDER ARTHROSCOPY WITH DEBRIDEMENT  AND BICEP TENDON REPAIR Right 04/13/2014   Procedure: RIGHT SHOULDER ARTHROSCOPY WITH DEBRIDEMENT EXTENSIVE;  Surgeon: Johnny Bridge, MD;  Location: [place];  Service: Orthopedics;  Laterality: Right;   TOTAL SHOULDER ARTHROPLASTY Right 11/23/2014   Procedure: TOTAL RIGHT SHOULDER ARTHROPLASTY;  Surgeon: Johnny Bridge, MD;  Location: [place];  Service: Orthopedics;  Laterality: Right;   TOTAL SHOULDER ARTHROPLASTY Left 07/05/2015   Procedure: LEFT TOTAL SHOULDER REPLACEMENT;  Surgeon: Marchia Bond, MD;  Location: [place];  Service: Orthopedics;  Laterality: Left;    Family History  Problem Relation Age of Onset   Cervical cancer Mother    Anesthesia problems Mother        post-op N/V   Rheum arthritis Mother    Anxiety disorder Mother    Cancer Father        colon cancer   Migraines Sister    Cervical cancer Sister    Anesthesia problems Sister        post-op N/V   Migraines Brother    Asthma Daughter    Cerebral palsy Daughter        age 5   Coronary artery disease Maternal Grandmother    Hypertension Maternal Grandmother    Diabetes Maternal  Grandmother    Coronary artery disease Paternal Grandmother    Hypertension Paternal Grandmother    Diabetes Paternal Grandmother    Breast cancer Maternal Aunt    Breast cancer Maternal Aunt    Breast cancer Maternal Aunt    Colon cancer Paternal Uncle    Lung cancer Paternal Uncle     Social History   Socioeconomic History   Marital status: Married    Spouse name: Not on file   Number of children: 3   Years of education: Not on file   Highest education level: Not on file  Occupational History   Occupation: Disabled 2003  Tobacco Use   Smoking status: Every Day    Packs/day: 1.00    Years: 42.75    Pack years: 42.75    Types: Cigarettes   Smokeless tobacco: Never  Vaping Use   Vaping Use: Never used  Substance and Sexual Activity   Alcohol use: Yes    Alcohol/week: 0.0 standard drinks    Comment: rare   Drug use: Yes    Types: Marijuana    Comment: marijuana 2 days ago   Sexual activity: Not on file  Other Topics Concern   Not on file  Social History Narrative   No living will   Husband, then oldest son Roosvelt Harps, should make decisions   Requests DNR--done 06/25/20   No tube feeds if cognitively unaware   Social Determinants of Health   Financial Resource Strain: Medium Risk   Difficulty of Paying Living Expenses: Somewhat hard  Food Insecurity: Not on file  Transportation Needs: Not on file  Physical Activity: Not on file  Stress: Not on file  Social Connections: Not on file  Intimate Partner Violence: Not on file   Review of Systems Eating fine Weight is up a few pounds Some balance/spinning feeling Some tinnitus     Objective:   Physical Exam Constitutional:      Appearance: Normal appearance.  Skin:    Comments: No distinct rash at this point  Neurological:     Mental Status: She is alert.           Assessment & Plan:

## 2021-10-14 NOTE — Progress Notes (Signed)
Spoke with patient in regards to both patient assistance forms for Spiriva ([employer]) and Creon Dina Rich). Patient stated she received them yesterday (10/13/2021). I gave patient my name and number in case she should have any questions when filling them out.   Debbora Dus, CPP notified  Marijean Niemann, [place] Clinical Pharmacy Assistant [phone]  Time Spent:  6 Minutes

## 2021-10-15 ENCOUNTER — Telehealth: Payer: Self-pay

## 2021-10-15 NOTE — Chronic Care Management (AMB) (Signed)
    Chronic Care Management Pharmacy Assistant   Name: AMANAT HACKEL  MRN: [id number] DOB: [birth year]   Reason for Encounter: Reminder Call   Conditions to be addressed/monitored: HTN and COPD    Medications: Outpatient Encounter Medications as of 10/15/2021  Medication Sig   albuterol (VENTOLIN HFA) 108 (90 Base) MCG/ACT inhaler INHALE 2 PUFFS BY MOUTH EVERY 6 HOURS ASNEEDED WHEEEZING/ SHORTNESS OF BREATH   ALPRAZolam (XANAX) 1 MG tablet TAKE 1/2 TO 1 TABLET BY MOUTH 3 TIMES DAILY AS NEEDED   cyanocobalamin (,VITAMIN B-12,) 1000 MCG/ML injection Inject 1 mL (1,000 mcg total) into the muscle every 30 (thirty) days for 12 doses.   fluticasone (FLONASE) 50 MCG/ACT nasal spray Place 2 sprays into both nostrils daily.   lamoTRIgine (LAMICTAL) 100 MG tablet Take 1 tablet (100 mg total) by mouth 2 (two) times daily.   losartan-hydrochlorothiazide (HYZAAR) 100-12.5 MG tablet TAKE 1 TABLET BY MOUTH ONCE DAILY   methocarbamol (ROBAXIN) 500 MG tablet Take 1,000 mg by mouth 3 (three) times daily as needed for muscle spasms. For 7 days (21 tablets prescribed at ED 07/12/21)   morphine (MSIR) 30 MG tablet Take 30 mg by mouth every 4 (four) hours as needed for severe pain. Up to 5 days (prescribed at ED 07/12/21)   ondansetron (ZOFRAN-ODT) 4 MG disintegrating tablet DISSOLVE 1 TABLET ON THE TONGUE EVERY 8 HOURS AS NEEDED FOR NAUSEA OR VOMIT   pantoprazole (PROTONIX) 40 MG tablet Take 1 tablet (40 mg total) by mouth 2 (two) times daily before a meal.   PARoxetine (PAXIL) 20 MG tablet Take 1 tablet (20 mg total) by mouth daily.   predniSONE (DELTASONE) 20 MG tablet Take 2 tablets (40 mg total) by mouth daily. For 5 days, then 1 tab daily for 5 days   Syringe/Needle, Disp, (SYRINGE 3CC/25GX1") 25G X 1" 3 ML MISC 3 mLs by Does not apply route every 30 (thirty) days.   No facility-administered encounter medications on file as of 10/15/2021.  CARRIEANN SPIELBERG was contacted to remind her of her upcoming  telephone visit with [name] on 10/20/21 at 1:00pm. Patient was reminded to have all medications, supplements and any blood glucose and blood pressure readings available for review at appointment.   Are you having any problems with your medications? No  Do you have any concerns you like to discuss with the pharmacist? No  The patient did receive the forms for patient assistance for the Spiriva and Creon in the mail .    Star Rating Drugs: Medication:    Last Fill: [facility] Supply Losartan-HCTZ 100-12.5mg   05/30/21 90  [facility] states 05/30/21 was last fill date for Losartan, patient state she just got refill recently and does  not miss any doses.    Debbora Dus, CPP notified  Avel Sensor, [place] Assistant [phone]  Total time spent for month CPA: 10 min

## 2021-10-20 ENCOUNTER — Other Ambulatory Visit: Payer: Self-pay

## 2021-10-20 ENCOUNTER — Ambulatory Visit (INDEPENDENT_AMBULATORY_CARE_PROVIDER_SITE_OTHER): Payer: Medicare HMO | Admitting: Pharmacist

## 2021-10-20 DIAGNOSIS — F39 Unspecified mood [affective] disorder: Secondary | ICD-10-CM

## 2021-10-20 DIAGNOSIS — E782 Mixed hyperlipidemia: Secondary | ICD-10-CM

## 2021-10-20 DIAGNOSIS — D500 Iron deficiency anemia secondary to blood loss (chronic): Secondary | ICD-10-CM

## 2021-10-20 DIAGNOSIS — J449 Chronic obstructive pulmonary disease, unspecified: Secondary | ICD-10-CM

## 2021-10-20 DIAGNOSIS — I10 Essential (primary) hypertension: Secondary | ICD-10-CM

## 2021-10-20 DIAGNOSIS — F17200 Nicotine dependence, unspecified, uncomplicated: Secondary | ICD-10-CM

## 2021-10-20 DIAGNOSIS — M19011 Primary osteoarthritis, right shoulder: Secondary | ICD-10-CM

## 2021-10-20 NOTE — Progress Notes (Signed)
Chronic Care Management Pharmacy Note  10/24/2021 Name:  Molly Peters MRN:  [id number] DOB:  Jul 22, 1961  Summary: - Patient unable to afford Spiriva. She did notice some improvement with Spiriva but stopped taking due to cost. She has received pt assistance paperwork but has not filled it out yet. - She also used to be on statin (unclear why discontinued 2017) and had not lipid panel updated since 2015. This was not updated at recent PCP visit.   Recommendations/Changes made from today's visit: -Counseled on smoking cessation. Pt not ready to quit but appreciate of info. -Reminded pt to fill out Spiriva forms ASAP -Recommend lipid panel at next office visit   Plan: -[place] will call patient 4 weeks to check on Sprivia PAP -Pharmacist follow up televisit scheduled for 6 months    Subjective: Molly Peters is an 60 y.o. year old female who is a primary patient of Venia Carbon, MD.  The CCM team was consulted for assistance with disease management and care coordination needs.    Engaged with patient by telephone for follow up visit in response to provider referral for pharmacy case management and/or care coordination services.   Consent to Services:  The patient was given information about Chronic Care Management services, agreed to services, and gave verbal consent prior to initiation of services.  Please see initial visit note for detailed documentation.   Patient Care Team: Venia Carbon, MD as PCP - General Virgel Manifold, MD as Consulting Physician (Gastroenterology) Vickie Epley, MD (Inactive) as Consulting Physician (General Surgery) Debbora Dus, [facility] as Pharmacist (Pharmacist)  Recent office visits:  10/10/21 Dr Silvio Pate OV: acute visit for rash. Rx prednixone 40 mg x 5 days, try cetirizine or fexofenadine. If no improvement, set up allergy appt.  06/30/21 - PCP - Pt presented for routine medical visit. COPD, She was unable to  afford Spiriva. Continues albuterol. HTN, good today. Malnutrition, add Ensure for skipped meals. Seizure free on Lamictal. Mood disorder stable on Xanax, paroxetine. RTC 1 year.   Recent consult visits:  08/06/2021 - Heme/onc - Patient presented for Iron Deficiency. Labs: CBC, Ferritin, Iron and TIBC, Vit B. Start: cyanocobalamin (,VITAMIN B-12,) 1000 MCG/ML injection. 07/01/2021 - [place] - Patient presented for Iron Deficiency; [place]. Procedures: PR VITAMIN B12 INJECTION and PR Jake Michaelis, IM [place] [facility] visits:  07/12/2021 [facility] Emergency Department - Patient presented for neck pain. Start: Methocarbamol 555m and Morphine 357m Discharged same day.  [facility]  - Patient presented for Cervicalgia. Procedures: PR INJECTION,THERAP/PROPH/DIAGNOST, IM OR SUBCUT and PR KETOROLAC TROMETHAMINE INJ. Discharged same day.     Objective:  Lab Results  Component Value Date   CREATININE 0.78 08/06/2021   BUN 13 08/06/2021   GFR 69.90 06/30/2021   GFRNONAA >60 08/06/2021   GFRAA >60 08/12/2020   NA 137 08/06/2021   K 3.4 (L) 08/06/2021   CALCIUM 9.4 08/06/2021   CO2 26 08/06/2021   GLUCOSE 98 08/06/2021    Lab Results  Component Value Date/Time   GFR 69.90 06/30/2021 03:28 PM   GFR 55.36 (L) 06/20/2019 11:41 AM    Lab Results  Component Value Date   CHOL 148 04/05/2014   HDL 42.70 04/05/2014   LDLCALC 82 04/05/2014   LDLDIRECT 155.7 10/10/2009   TRIG 116.0 04/05/2014   CHOLHDL 3 04/05/2014    Hepatic Function Latest Ref Rng & Units 08/06/2021 06/30/2021 01/27/2021  Total Protein 6.5 - 8.1  g/dL 7.9 7.1 8.3(H)  Albumin 3.5 - 5.0 g/dL 4.2 4.2 4.6  AST 15 - 41 U/L _0 ALT 0 - 44 U/L _1 Alk Phosphatase 38 - 126 U/L 87 74 72  Total Bilirubin 0.3 - 1.2 mg/dL 0.7 0.5 0.5  Bilirubin, Direct 0.0 - 0.3 mg/dL - - -    Lab Results  Component Value Date/Time   TSH 1.256 07/05/2018 12:35 PM    TSH 0.30 (L) 04/04/2014 01:02 PM   TSH 0.43 04/05/2013 11:50 AM   FREET4 0.77 06/30/2021 03:28 PM   FREET4 0.67 01/26/2020 01:07 PM    CBC Latest Ref Rng & Units 08/06/2021 06/30/2021 03/06/2021  WBC 4.0 - 10.5 K/uL 7.7 7.5 9.2  Hemoglobin 12.0 - 15.0 g/dL 14.8 15.1(H) 14.4  Hematocrit 36.0 - 46.0 % 41.3 42.9 41.7  Platelets 150 - 400 K/uL 355 394.0 307    Lab Results  Component Value Date/Time   VD25OH 10.5 (L) 02/18/2016 02:16 PM   VD25OH - Total on 03/12/20 38 (Gastroenterology)  Clinical ASCVD: Yes  The ASCVD Risk score (Arnett DK, et al., 2019) failed to calculate for the following reasons:   Cannot find a previous HDL lab   Cannot find a previous total cholesterol lab    Depression screen [facility] 2/9 06/30/2021 06/25/2020 06/20/2019  Decreased Interest 0 0 0  Down, Depressed, Hopeless 0 1 0  PHQ - 2 Score 0 1 0  Some recent data might be hidden    Social History   Tobacco Use  Smoking Status Every Day   Packs/day: 1.00   Years: 42.75   Pack years: 42.75   Types: Cigarettes  Smokeless Tobacco Never   BP Readings from Last 3 Encounters:  10/10/21 138/86  08/06/21 (!) 158/104  06/30/21 128/82   Pulse Readings from Last 3 Encounters:  10/10/21 82  08/06/21 72  06/30/21 68   Wt Readings from Last 3 Encounters:  10/10/21 97 lb (44 kg)  08/06/21 94 lb 5.7 oz (42.8 kg)  06/30/21 93 lb 12 oz (42.5 kg)   BMI Readings from Last 3 Encounters:  10/10/21 18.94 kg/m  08/06/21 18.74 kg/m  06/30/21 18.62 kg/m    Assessment/Interventions: Review of patient past medical history, allergies, medications, health status, including review of consultants reports, laboratory and other test data, was performed as part of comprehensive evaluation and provision of chronic care management services.   SDOH:  (Social Determinants of Health) assessments and interventions performed: Yes    SDOH Screenings   Alcohol Screen: Not on file  Depression (PHQ2-9): Low Risk    PHQ-2 Score: 0   Financial Resource Strain: Medium Risk   Difficulty of Paying Living Expenses: Somewhat hard  Food Insecurity: Not on file  Housing: Not on file  Physical Activity: Not on file  Social Connections: Not on file  Stress: Not on file  Tobacco Use: High Risk   Smoking Tobacco Use: Every Day   Smokeless Tobacco Use: Never   Passive Exposure: Not on file  Transportation Needs: Not on file    CCM Care Plan  Allergies  Allergen Reactions   Carbamazepine Hives   Divalproex Sodium Swelling and Other (See Comments)    HAIR FALLS OUT   Clonazepam Other (See Comments)    HALLUCINATIONS   Diphenhydramine Hcl Rash   Tiotropium Bromide Monohydrate Other (See Comments)    CREATES YEAST IN THROAT   Vicodin [Hydrocodone-Acetaminophen] Itching    Medications Reviewed Today  Reviewed by Charlton Haws, RPH (Pharmacist) on 10/20/21 at 1330  Med List Status: <None>   Medication Order Taking? Sig Documenting Provider Last Dose Status Informant  albuterol (VENTOLIN HFA) 108 (90 Base) MCG/ACT inhaler [id number] Yes INHALE 2 PUFFS BY MOUTH EVERY 6 HOURS ASNEEDED WHEEEZING/ SHORTNESS OF BREATH Venia Carbon, MD Taking Active   ALPRAZolam Duanne Moron) 1 MG tablet [id number] Yes TAKE 1/2 TO 1 TABLET BY MOUTH 3 TIMES DAILY AS NEEDED Venia Carbon, MD Taking Active   cyanocobalamin (,VITAMIN B-12,) 1000 MCG/ML injection [id number] Yes Inject 1 mL (1,000 mcg total) into the muscle every 30 (thirty) days for 12 doses. Sindy Guadeloupe, MD Taking Active   fluticasone Asencion Islam) 50 MCG/ACT nasal spray [id number] Yes Place 2 sprays into both nostrils daily. [provider] Taking Active   lamoTRIgine (LAMICTAL) 100 MG tablet [id number] Yes Take 1 tablet (100 mg total) by mouth 2 (two) times daily. Venia Carbon, MD Taking Active   losartan-hydrochlorothiazide [facility]) 100-12.5 MG tablet [id number] Yes TAKE 1 TABLET BY MOUTH ONCE DAILY  Patient taking differently: Take 0.5 tablets by mouth daily.    Venia Carbon, MD Taking Active   methocarbamol (ROBAXIN) 500 MG tablet [id number] Yes Take 1,000 mg by mouth 3 (three) times daily as needed for muscle spasms. For 7 days (21 tablets prescribed at ED 07/12/21) [provider] Taking Active Self  morphine (MSIR) 30 MG tablet [id number] Yes Take 30 mg by mouth every 4 (four) hours as needed for severe pain. Up to 5 days (prescribed at ED 07/12/21) [provider] Taking Active Self  ondansetron (ZOFRAN-ODT) 4 MG disintegrating tablet [id number] Yes DISSOLVE 1 TABLET ON THE TONGUE EVERY 8 HOURS AS NEEDED FOR NAUSEA OR VOMIT Venia Carbon, MD Taking Active   pantoprazole (PROTONIX) 40 MG tablet [id number] Yes Take 1 tablet (40 mg total) by mouth 2 (two) times daily before a meal. [name] B, MD Taking Active   PARoxetine (PAXIL) 20 MG tablet [id number] Yes Take 1 tablet (20 mg total) by mouth daily. Venia Carbon, MD Taking Active   Syringe/Needle, Disp, (SYRINGE 3CC/25GX1") 25G X 1" 3 ML MISC [id number] Yes 3 mLs by Does not apply route every 30 (thirty) days. Sindy Guadeloupe, MD Taking Active   Med List Note Dewayne Shorter, RN 01/29/16 1519): UDS 01-29-16            Patient Active Problem List   Diagnosis Date Noted   Urticaria 10/10/2021   Benzodiazepine dependence ([place]) 06/30/2021   Acute gastric ulcer without hemorrhage or perforation    Acute gastric erosion    Malnutrition of mild degree ([place]) 06/25/2020   Personal history of colonic polyps    Diverticulosis of large intestine without diverticulitis    Pulmonary nodules 08/08/2018   Iron deficiency anemia due to chronic blood loss 07/05/2018   Polyp of colon    Acute peptic ulcer of stomach    Advance directive discussed with patient 10/27/2017   Neuropathy 08/11/2016   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   Hx of cervical spine surgery (Left ACDF) 02/02/2016   Marijuana use 01/29/2016   Osteoarthritis of  shoulder (Left) 07/05/2015   S/P Bilateral Total Shoulder Replacement (2016) 11/23/2014   Osteoarthritis of shoulder (Right) 04/13/2014   Routine general medical examination at a health care facility 04/05/2013   Restless legs syndrome (RLS) 10/21/2010   Essential hypertension, benign 06/23/2010   HYPERLIPIDEMIA-MIXED 10/23/2009  Valvular heart disease 03/27/2009   IRRITABLE BOWEL SYNDROME 11/13/2008   Mood disorder ([place]) 09/07/2007   Tobacco use disorder 09/07/2007   GLAUCOMA 09/07/2007   HEMORRHOIDS 09/07/2007   Esophagogastric ulcer 09/07/2007   COPD (chronic obstructive pulmonary disease) with chronic bronchitis ([place]) 08/08/2007   GERD 08/08/2007   Seizure disorder ([place]) 08/08/2007    Immunization History  Administered Date(s) Administered   Influenza Split 09/28/2011   Influenza Whole 10/03/2007, 09/20/2009, 10/21/2010   Influenza, Quadrivalent, Recombinant, Inj, Pf 08/23/2020   Influenza,inj,Quad PF,6+ Mos 08/11/2016, 10/27/2017   Pneumococcal Conjugate-13 10/27/2017   Pneumococcal Polysaccharide-23 10/09/2009   Td 01/09/2003    Conditions to be addressed/monitored:  Hypertension, Hyperlipidemia, GERD, COPD, Anxiety, Osteoarthritis, Tobacco use, and Iron deficiency  Care Plan : [place]  Updates made by Charlton Haws, [place] since 10/24/2021 12:00 AM     Problem: Hypertension, Hyperlipidemia, GERD, COPD, Anxiety, Osteoarthritis, Tobacco use, and Iron deficiency   Priority: High  Onset Date: 06/10/2021     Long-Range Goal: Disease mgmt   Start Date: 10/20/2021  Expected End Date: 10/20/2022  This Visit's Progress: On track  Priority: High  Note:   Current Barriers:  Cost - Spiriva and Creon   Pharmacist Clinical Goal(s):  Patient will contact provider office for questions/concerns as evidenced notation of same in electronic health record through collaboration with PharmD and provider.   Interventions: 1:1 collaboration with Venia Carbon, MD regarding development and update of comprehensive plan of care as evidenced by provider attestation and co-signature Inter-disciplinary care team collaboration (see longitudinal plan of care) Comprehensive medication review performed; medication list updated in electronic medical record  Hypertension  (BP goal <140/90) -Controlled - last PCP visit BP was at goal -Current treatment: Losartan-HCTZ 100- 12.5 mg - takes 1/2 tablet daily -Medications previously tried: none  -Current home readings: none reported, she has a fingertip monitor but I encouraged her not to use this -From initial visit 06/10/21 -Exercise - limited due to COPD, back pain. She tries to work out on the [employer] at night.  -Denies hypotensive/hypertensive symptoms -Educated on BP goals and benefits of medications for prevention of heart attack, stroke and kidney damage -Recommended to continue current medication; Consider purchasing a home BP monitor with arm cuff.  Hyperlipidemia: (LDL goal < 100) -Unable to assess, last lipid panel 2015 and patient was on simvastatin at this time. Reason for d/c not documented. Lipid panel not updated at recent wellness visit. -Current treatment: None -Medications previously tried:  simvastatin 20 mg (d/c'd per pain mgmt 2017 "error") - no adverse effects documented -Recommended update lipid panel; Patient does not like to drive/gas money so we will wait until next lab visit.   COPD (Goal: control symptoms and prevent exacerbations) -Not ideally controlled - pt reports using albuterol ~3 times a week; she noted improvement on Spiriva x 1 month but could not afford it, she has received PAP forms but has not completed them yet -Initial pulmonology consult completed 08/23/20, Spiriva started. Pt completed PFTs but no follow up since then. PFTs considered normal. Pt has not scheduled follow up. -Gold Grade: Gold 1 (FEV1>80%) -Current COPD Classification:  B (high sx, <2  exacerbations/yr) -MMRC/CAT score: none -Pulmonary function testing: 09/2020; FEV1 73% predicted, ratio 0.69;  change -2%, no evidence of disease -Current treatment  Albuterol - PRN Spiriva Respimat 2.5 mcg/act - working on PAP -Medications previously tried: Advair, Symbicort, Spiriva DPI - thrush -Exacerbations requiring treatment in last 6 months: none -Patient  denies consistent use of maintenance inhaler -Counseled on Benefits of consistent maintenance inhaler use -Recommended to continue current medication; f/u Spiriva PAP approval  Tobacco use (Goal: quit smoking) -Current smoker - 3/4 PPD -Quit history -she quit for 15 months about 20 years ago with nicotine patches/inhaler, went back to work and coworkers smoked. -Counseled on benefits of smoking cessation, offered support if she decides to quit; pt voiced understanding and will think about quitting  Depression/Anxiety (Goal: Improve mood) -Controlled - per patient report  -Current treatment: Paroxetine 20 mg - 1 tablet daily Xanax 1 mg - 1/2-1 tablet TID PRN (pt takes 1 tablet BID) -Medications previously tried/failed: none -PHQ9: 2 (2019) -Family stressors but overall stable, doing well on current therapy -Recommended to continue current medication  Osteoarthritis (Goal: Improve pain level) -Not ideally controlled - per patient report -Pt went to ED twice in August for neck pain - still taking methocarbamol and morphine sparingly PRN. -She reports h/o both shoulders replaced, knee surgery, 3 back surgeries and ruptured disc -She has stopped NSAIDs due to PUD. -Current treatment  Voltaren gel 1% - apply PRN (used twice so far) Tylenol Arthritis Strength - as needed (reports ineffective) Methocarbamol 500 mg PRN -Medications previously tried: multiple -We discussed scheduling Tylenol and rotating with Voltaren gel.  -Recommended to continue current medication; Try scheduling Tylenol 500 mg-1000 mg TID. Use Voltaren gel  2-4 times daily in between use of Tylenol.  Iron Deficiency (Goal: Iron panel WNL) -Managed by hematology/oncology -She takes oral iron and gets infusions -Current treatment  Iron 65 mg - 1 tablet every day -Medications previously tried: none reported  -Recommended to continue current medication  Health Maintenance -Vaccine gaps: Prevnar20 or PPSV23, Shingrix, TD -Advised to get Pneumonia vaccine at next PCP visit (Prevnar 20 preferred, if unavailable PPSV23)  Other -  B12 injections monthly (self-injects) Protonix BID for PUD (pt takes PRN) Creon - 2 before meals,1  before snacks (not taking) PRN - Zofran  Patient Goals/Self-Care Activities Patient will:  - work with provider on medication access solutions       Medication Assistance:  Spiriva ([employer]) PAP - in process. Pt received forms 10/13/21  Compliance/Adherence/Medication fill history: Care Gaps: Mammogram due - October 2022  Star-[facility] Drugs: Medication:                Last Fill:         Day Supply Losartan/Hctz              05/30/21            90 PDC 50%  Losartan past due for refill, pt confirms daily adherence   Patient's preferred pharmacy is: [facility], [address] [place] [address] Phone: [phone] Fax: [phone]  Uses pill box? No - denies need for it Pt endorses 100% compliance  We discussed: Benefits of medication synchronization, packaging and delivery as well as enhanced pharmacist oversight with [employer]. Patient decided to: Continue current medication management strategy  Care Plan and Follow Up Patient Decision:  Patient agrees to Care Plan and Follow-up.  [name], PharmD, BCACP Clinical Pharmacist [facility] at [facility] [phone]

## 2021-10-24 NOTE — Patient Instructions (Addendum)
Visit Information  Phone number for Pharmacist: [phone]   Goals Addressed             This Visit's Progress    Manage My Medicine       Timeframe:  Long-Range Goal Priority:  High Start Date:         10/20/21                    Expected End Date:       10/20/22                Follow Up Date June 2023   - call for medicine refill 2 or 3 days before it runs out - call if I am sick and can't take my medicine - learn to read medicine labels -Fill out Spriva forms and mail back     Why is this important?   These steps will help you keep on track with your medicines.   Notes:         Care Plan : [place]  Updates made by Charlton Haws, RPH since 10/24/2021 12:00 AM     Problem: Hypertension, Hyperlipidemia, GERD, COPD, Anxiety, Osteoarthritis, Tobacco use, and Iron deficiency   Priority: High  Onset Date: 06/10/2021     Long-Range Goal: Disease mgmt   Start Date: 10/20/2021  Expected End Date: 10/20/2022  This Visit's Progress: On track  Priority: High  Note:   Current Barriers:  Cost - Spiriva and Creon   Pharmacist Clinical Goal(s):  Patient will contact provider office for questions/concerns as evidenced notation of same in electronic health record through collaboration with PharmD and provider.   Interventions: 1:1 collaboration with Venia Carbon, MD regarding development and update of comprehensive plan of care as evidenced by provider attestation and co-signature Inter-disciplinary care team collaboration (see longitudinal plan of care) Comprehensive medication review performed; medication list updated in electronic medical record  Hypertension  (BP goal <140/90) -Controlled - last PCP visit BP was at goal -Current treatment: Losartan-HCTZ 100- 12.5 mg - takes 1/2 tablet daily -Medications previously tried: none  -Current home readings: none reported, she has a fingertip monitor but I encouraged her not to use this -From  initial visit 06/10/21 -Exercise - limited due to COPD, back pain. She tries to work out on the [employer] at night.  -Denies hypotensive/hypertensive symptoms -Educated on BP goals and benefits of medications for prevention of heart attack, stroke and kidney damage -Recommended to continue current medication; Consider purchasing a home BP monitor with arm cuff.  Hyperlipidemia: (LDL goal < 100) -Unable to assess, last lipid panel 2015 and patient was on simvastatin at this time. Reason for d/c not documented. Lipid panel not updated at recent wellness visit. -Current treatment: None -Medications previously tried:  simvastatin 20 mg (d/c'd per pain mgmt 2017 "error") - no adverse effects documented -Recommended update lipid panel; Patient does not like to drive/gas money so we will wait until next lab visit.   COPD (Goal: control symptoms and prevent exacerbations) -Not ideally controlled - pt reports using albuterol ~3 times a week; she noted improvement on Spiriva x 1 month but could not afford it, she has received PAP forms but has not completed them yet -Initial pulmonology consult completed 08/23/20, Spiriva started. Pt completed PFTs but no follow up since then. PFTs considered normal. Pt has not scheduled follow up. -Gold Grade: Gold 1 (FEV1>80%) -Current COPD Classification:  B (high sx, <2 exacerbations/yr) -MMRC/CAT score:  none -Pulmonary function testing: 09/2020; FEV1 73% predicted, ratio 0.69;  change -2%, no evidence of disease -Current treatment  Albuterol - PRN Spiriva Respimat 2.5 mcg/act - working on PAP -Medications previously tried: Advair, Symbicort, Spiriva DPI - thrush -Exacerbations requiring treatment in last 6 months: none -Patient denies consistent use of maintenance inhaler -Counseled on Benefits of consistent maintenance inhaler use -Recommended to continue current medication; f/u Spiriva PAP approval  Tobacco use (Goal: quit smoking) -Current smoker - 3/4  PPD -Quit history -she quit for 15 months about 20 years ago with nicotine patches/inhaler, went back to work and coworkers smoked. -Counseled on benefits of smoking cessation, offered support if she decides to quit; pt voiced understanding and will think about quitting  Depression/Anxiety (Goal: Improve mood) -Controlled - per patient report  -Current treatment: Paroxetine 20 mg - 1 tablet daily Xanax 1 mg - 1/2-1 tablet TID PRN (pt takes 1 tablet BID) -Medications previously tried/failed: none -PHQ9: 2 (2019) -Family stressors but overall stable, doing well on current therapy -Recommended to continue current medication  Osteoarthritis (Goal: Improve pain level) -Not ideally controlled - per patient report -Pt went to ED twice in August for neck pain - still taking methocarbamol and morphine sparingly PRN. -She reports h/o both shoulders replaced, knee surgery, 3 back surgeries and ruptured disc -She has stopped NSAIDs due to PUD. -Current treatment  Voltaren gel 1% - apply PRN (used twice so far) Tylenol Arthritis Strength - as needed (reports ineffective) Methocarbamol 500 mg PRN -Medications previously tried: multiple -We discussed scheduling Tylenol and rotating with Voltaren gel.  -Recommended to continue current medication; Try scheduling Tylenol 500 mg-1000 mg TID. Use Voltaren gel 2-4 times daily in between use of Tylenol.  Iron Deficiency (Goal: Iron panel WNL) -Managed by hematology/oncology -She takes oral iron and gets infusions -Current treatment  Iron 65 mg - 1 tablet every day -Medications previously tried: none reported  -Recommended to continue current medication  Health Maintenance -Vaccine gaps: Prevnar20 or PPSV23, Shingrix, TD -Advised to get Pneumonia vaccine at next PCP visit (Prevnar 20 preferred, if unavailable PPSV23)  Other -  B12 injections monthly (self-injects) Protonix BID for PUD (pt takes PRN) Creon - 2 before meals,1  before snacks (not  taking) PRN - Zofran  Patient Goals/Self-Care Activities Patient will:  - work with provider on medication access solutions       Patient verbalizes understanding of instructions provided today and agrees to view in [place].  Telephone follow up appointment with pharmacy team member scheduled for: 6 months  [name], PharmD, [facility] Clinical Pharmacist [facility] [phone]

## 2021-11-03 DIAGNOSIS — F419 Anxiety disorder, unspecified: Secondary | ICD-10-CM | POA: Diagnosis not present

## 2021-11-03 DIAGNOSIS — M25511 Pain in right shoulder: Secondary | ICD-10-CM | POA: Diagnosis not present

## 2021-11-03 DIAGNOSIS — M19021 Primary osteoarthritis, right elbow: Secondary | ICD-10-CM | POA: Diagnosis not present

## 2021-11-03 DIAGNOSIS — S4991XA Unspecified injury of right shoulder and upper arm, initial encounter: Secondary | ICD-10-CM | POA: Diagnosis not present

## 2021-11-03 DIAGNOSIS — M19011 Primary osteoarthritis, right shoulder: Secondary | ICD-10-CM | POA: Diagnosis not present

## 2021-11-03 DIAGNOSIS — I10 Essential (primary) hypertension: Secondary | ICD-10-CM | POA: Diagnosis not present

## 2021-11-03 DIAGNOSIS — F17210 Nicotine dependence, cigarettes, uncomplicated: Secondary | ICD-10-CM | POA: Diagnosis not present

## 2021-11-03 DIAGNOSIS — Z96611 Presence of right artificial shoulder joint: Secondary | ICD-10-CM | POA: Diagnosis not present

## 2021-11-08 DIAGNOSIS — J449 Chronic obstructive pulmonary disease, unspecified: Secondary | ICD-10-CM | POA: Diagnosis not present

## 2021-11-08 DIAGNOSIS — E782 Mixed hyperlipidemia: Secondary | ICD-10-CM | POA: Diagnosis not present

## 2021-11-08 DIAGNOSIS — I10 Essential (primary) hypertension: Secondary | ICD-10-CM

## 2021-11-08 DIAGNOSIS — M19011 Primary osteoarthritis, right shoulder: Secondary | ICD-10-CM

## 2021-11-08 DIAGNOSIS — D500 Iron deficiency anemia secondary to blood loss (chronic): Secondary | ICD-10-CM

## 2021-11-11 DIAGNOSIS — S46001A Unspecified injury of muscle(s) and tendon(s) of the rotator cuff of right shoulder, initial encounter: Secondary | ICD-10-CM | POA: Diagnosis not present

## 2021-11-11 DIAGNOSIS — Z96611 Presence of right artificial shoulder joint: Secondary | ICD-10-CM | POA: Diagnosis not present

## 2021-11-17 ENCOUNTER — Telehealth: Payer: Self-pay

## 2021-11-17 DIAGNOSIS — M25511 Pain in right shoulder: Secondary | ICD-10-CM | POA: Diagnosis not present

## 2021-11-17 NOTE — Progress Notes (Signed)
° ° °  Chronic Care Management Pharmacy Assistant   Name: ILIYAH BUI  MRN: [id number] DOB: 08-30-1961  Reason for Encounter: CCM (Spiriva PAP follow-up)   Called patient to follow up on her Spiriva patient assistance paperwork. No answer; left message.  Called [employer] to check if forms have been submitted. [facility] does not have any information for the patient. Forms can be faxed to [phone].  Debbora Dus, CPP notified  Marijean Niemann, [place] Clinical Pharmacy Assistant [phone]  Time Spent: 15 Minutes

## 2021-11-20 DIAGNOSIS — M25511 Pain in right shoulder: Secondary | ICD-10-CM | POA: Diagnosis not present

## 2021-11-21 ENCOUNTER — Other Ambulatory Visit: Payer: Self-pay | Admitting: Internal Medicine

## 2021-11-21 NOTE — Telephone Encounter (Addendum)
Spiriva was prescribed by Dr. Silvio Pate most recently. The forms can be emailed to myself ([email or web address]) or dropped off at [facility] for Dr. Silvio Pate to sign. Let me know how she would like to proceed.  Did she submit the forms for Creon to [facility] (Dr. Bonna Gains)?  Debbora Dus, PharmD Clinical Pharmacist Practitioner [facility] at [facility] [phone]

## 2021-11-21 NOTE — Progress Notes (Signed)
Patient contacted me to get message to CPP, she drove to specialist office to drop off PAP forms for Spiriva and the office was closed. She has the forms at her home.  Debbora Dus, CPP notified  Avel Sensor, [place] Assistant [phone]  Total time spent for month CPA: 10 min

## 2021-11-21 NOTE — Telephone Encounter (Signed)
Last filled 09-29-21 #90 Last OV 10-10-21 acute Next OV 07-07-22 [facility]

## 2021-11-24 DIAGNOSIS — M25511 Pain in right shoulder: Secondary | ICD-10-CM | POA: Diagnosis not present

## 2021-11-27 DIAGNOSIS — M25511 Pain in right shoulder: Secondary | ICD-10-CM | POA: Diagnosis not present

## 2021-11-28 ENCOUNTER — Encounter: Payer: Self-pay | Admitting: Urgent Care

## 2021-11-28 NOTE — Progress Notes (Signed)
Called [employer] to check on the status of patient's Spiriva forms. After holding for so long they make you leave a message stating they will get back with you within 1 business day. I left a message.  Debbora Dus, CPP notified  Marijean Niemann, [place] Clinical Pharmacy Assistant [phone]  Time Spent: 5 Minutes

## 2021-11-28 NOTE — Telephone Encounter (Signed)
Signing encounter, see previous note 01/24/21 °

## 2021-12-01 NOTE — Progress Notes (Signed)
Called [employer] [facility] to check on the status of patient's Spiriva forms. BI does not have any forms on file for Spiriva. Please re-fax patient forms to [phone]. Please include a fax cover sheet with patient's full name and date of birth.    Debbora Dus, CPP notified   Marijean Niemann, [place] Clinical Pharmacy Assistant [phone]   Time Spent: 10 minutes

## 2021-12-04 DIAGNOSIS — M25511 Pain in right shoulder: Secondary | ICD-10-CM | POA: Diagnosis not present

## 2021-12-04 NOTE — Telephone Encounter (Signed)
As of 11/21/21, Velmena was asked to contact pt and let her know the following:  "Spiriva was prescribed by Dr. Silvio Pate most recently. The forms can be emailed to myself ([email or web address]) or dropped off at [facility] for Dr. Silvio Pate to sign. Let me know how she would like to proceed.   Did she submit the forms for Creon to [facility] (Dr. Bonna Gains)?"  Will send to [facility] to follow up with patient.

## 2021-12-08 DIAGNOSIS — M25511 Pain in right shoulder: Secondary | ICD-10-CM | POA: Diagnosis not present

## 2021-12-08 NOTE — Telephone Encounter (Signed)
Please make a note to check on status of application (have they been received at [facility] office?) by 12/15/20.

## 2021-12-08 NOTE — Progress Notes (Signed)
I will make sure Lacey has received them.  Debbora Dus, CPP notified  Marijean Niemann, [place] Clinical Pharmacy Assistant [phone]

## 2021-12-08 NOTE — Telephone Encounter (Addendum)
Contacted the patient and she stated she drove to Dr.Tahiliani's office with the forms for Creon and the office was closed. She has those forms and the forms for Spiriva with her. She will mail the forms to Debbora Dus, PharmD to the [employer] today.  Debbora Dus, CPP notified  Avel Sensor, [place] Assistant [phone]  Total time spent for month CPA: 10 min.

## 2021-12-11 DIAGNOSIS — M25511 Pain in right shoulder: Secondary | ICD-10-CM | POA: Diagnosis not present

## 2021-12-12 DIAGNOSIS — Z96611 Presence of right artificial shoulder joint: Secondary | ICD-10-CM | POA: Diagnosis not present

## 2021-12-12 DIAGNOSIS — S46001D Unspecified injury of muscle(s) and tendon(s) of the rotator cuff of right shoulder, subsequent encounter: Secondary | ICD-10-CM | POA: Diagnosis not present

## 2021-12-17 NOTE — Progress Notes (Signed)
[place] office to make sure they received the Spiriva patient assistance forms. Spoke with the [place] office; she stated that they have taken all the paperwork back to [facility]. They do not have any paperwork at the [place] office that belongs to [facility] patients. Will need to follow up with [facility] when they re-open.   Debbora Dus, CPP notified  Marijean Niemann, [place] Clinical Pharmacy Assistant [phone]  Time Spent: 7 Minutes

## 2021-12-24 DIAGNOSIS — T8484XA Pain due to internal orthopedic prosthetic devices, implants and grafts, initial encounter: Secondary | ICD-10-CM | POA: Diagnosis not present

## 2021-12-24 DIAGNOSIS — Z96611 Presence of right artificial shoulder joint: Secondary | ICD-10-CM | POA: Diagnosis not present

## 2021-12-29 ENCOUNTER — Other Ambulatory Visit: Payer: Self-pay | Admitting: Internal Medicine

## 2021-12-29 NOTE — Telephone Encounter (Signed)
Last filled 11-21-21 #90 Last OV 10-10-21 acute Next OV 07-07-22 [facility]

## 2022-01-01 DIAGNOSIS — Z96611 Presence of right artificial shoulder joint: Secondary | ICD-10-CM | POA: Diagnosis not present

## 2022-01-01 DIAGNOSIS — M25511 Pain in right shoulder: Secondary | ICD-10-CM | POA: Diagnosis not present

## 2022-01-01 DIAGNOSIS — M25311 Other instability, right shoulder: Secondary | ICD-10-CM | POA: Diagnosis not present

## 2022-01-01 DIAGNOSIS — G8929 Other chronic pain: Secondary | ICD-10-CM | POA: Diagnosis not present

## 2022-01-07 ENCOUNTER — Telehealth: Payer: Self-pay

## 2022-01-07 NOTE — Chronic Care Management (AMB) (Unsigned)
Chronic Care Management Pharmacy Assistant   Name: Molly Peters  MRN: [id number] DOB: 1961-02-02   Reason for Encounter: COPD Disease State   Recent office visits:  None since last CCM contact  Recent consult visits:  12/24/21-[facility]-Robert Creighton,MD- Patient presented for follow up bilat shoulder replacements.Referral for ultrasound and synovasure 11/11/21-[facility]-Nicole Lane,PA-Patient presented for follow up bilat shoulder replacements.Use ice 2-3 times daily.Referral for Physical therapy-start Naprosyn and Robaxin 500mg  take 1000mg   3 times daily for 7 days  11/03/21-[place] [facility]-no data found   Hospital visits:  None in previous 6 months  Medications: Outpatient Encounter Medications as of 01/07/2022  Medication Sig   albuterol (VENTOLIN HFA) 108 (90 Base) MCG/ACT inhaler INHALE 2 PUFFS BY MOUTH EVERY 6 HOURS ASNEEDED WHEEEZING/ SHORTNESS OF BREATH   ALPRAZolam (XANAX) 1 MG tablet TAKE 1/2 TO 1 TABLET BY MOUTH 3 TIMES DAILY AS NEEDED   cyanocobalamin (,VITAMIN B-12,) 1000 MCG/ML injection Inject 1 mL (1,000 mcg total) into the muscle every 30 (thirty) days for 12 doses.   fluticasone (FLONASE) 50 MCG/ACT nasal spray Place 2 sprays into both nostrils daily.   lamoTRIgine (LAMICTAL) 100 MG tablet Take 1 tablet (100 mg total) by mouth 2 (two) times daily.   losartan-hydrochlorothiazide (HYZAAR) 100-12.5 MG tablet TAKE 1 TABLET BY MOUTH ONCE DAILY (Patient taking differently: Take 0.5 tablets by mouth daily.)   methocarbamol (ROBAXIN) 500 MG tablet Take 1,000 mg by mouth 3 (three) times daily as needed for muscle spasms. For 7 days (21 tablets prescribed at ED 07/12/21)   morphine (MSIR) 30 MG tablet Take 30 mg by mouth every 4 (four) hours as needed for severe pain. Up to 5 days (prescribed at ED 07/12/21)   ondansetron (ZOFRAN-ODT) 4 MG disintegrating tablet DISSOLVE 1 TABLET ON THE TONGUE EVERY 8 HOURS AS NEEDED FOR NAUSEA OR VOMIT   pantoprazole (PROTONIX)  40 MG tablet Take 1 tablet (40 mg total) by mouth 2 (two) times daily before a meal.   PARoxetine (PAXIL) 20 MG tablet Take 1 tablet (20 mg total) by mouth daily.   Syringe/Needle, Disp, (SYRINGE 3CC/25GX1") 25G X 1" 3 ML MISC 3 mLs by Does not apply route every 30 (thirty) days.   No facility-administered encounter medications on file as of 01/07/2022.   Contacted patient to discuss COPD disease state  Current COPD regimen: ***   Any recent hospitalizations or ED visits since last visit with CPP? {yes/no:20286}  {reports/denies:25180} COPD symptoms, including {COPD Symptoms:24140}   What recent interventions/DTPs have been made by any provider to improve breathing since last visit:   Have you had exacerbation/flare-up since last visit? {yes/no:20286}  What do you do when you are short of breath?  {SOBresposne:24139}***  Current tobacco use? Cigarettes 1/2 pk a day  Interested in cessation? {yes/no:20286}  Respiratory Devices/Equipment Do you have a nebulizer? {yes/no:20286} Do you use a Peak Flow Meter? {yes/no:20286} Do you use a maintenance inhaler? {yes/no:20286} How often do you forget to use your daily inhaler? *** Do you use a rescue inhaler? {yes/no:20286} How often do you use your rescue inhaler?  {CHL HP [facility] Inhaler VOJJ:[id number]} Do you use a spacer with your inhaler? {yes/no:20286}  Adherence Review: Does the patient have >5 day gap between last estimated fill date for maintenance inhaler medications? {yes/no:20286}       Appointment: 02/06/22  Oncology  Care Gaps: Annual wellness visit in last year? {yes/no:20286} Most Recent BP reading:   [facility] Medication Name  Last [facility]  Date  Day supply Losartan/Hctz 100-12.5mg  09/12/21  90  Fluticasone 8mcg/act 07/22/20  30 Albuterol 108 mcg/act  06/21/21  [place], [place]  CPP notified  Avel Sensor, [place] Assistant [phone]  Total time spent for month CPA:  ***

## 2022-01-27 ENCOUNTER — Other Ambulatory Visit: Payer: Self-pay | Admitting: *Deleted

## 2022-01-27 DIAGNOSIS — D500 Iron deficiency anemia secondary to blood loss (chronic): Secondary | ICD-10-CM

## 2022-02-03 ENCOUNTER — Inpatient Hospital Stay: Payer: Medicare HMO

## 2022-02-03 ENCOUNTER — Other Ambulatory Visit: Payer: Medicare HMO

## 2022-02-06 ENCOUNTER — Inpatient Hospital Stay: Payer: Medicare HMO | Admitting: Oncology

## 2022-02-10 ENCOUNTER — Inpatient Hospital Stay: Payer: Medicare HMO | Attending: Oncology

## 2022-02-10 DIAGNOSIS — I10 Essential (primary) hypertension: Secondary | ICD-10-CM | POA: Insufficient documentation

## 2022-02-10 DIAGNOSIS — Z8719 Personal history of other diseases of the digestive system: Secondary | ICD-10-CM | POA: Insufficient documentation

## 2022-02-10 DIAGNOSIS — Z801 Family history of malignant neoplasm of trachea, bronchus and lung: Secondary | ICD-10-CM | POA: Insufficient documentation

## 2022-02-10 DIAGNOSIS — R5383 Other fatigue: Secondary | ICD-10-CM | POA: Insufficient documentation

## 2022-02-10 DIAGNOSIS — Z833 Family history of diabetes mellitus: Secondary | ICD-10-CM | POA: Insufficient documentation

## 2022-02-10 DIAGNOSIS — Z803 Family history of malignant neoplasm of breast: Secondary | ICD-10-CM | POA: Insufficient documentation

## 2022-02-10 DIAGNOSIS — F1290 Cannabis use, unspecified, uncomplicated: Secondary | ICD-10-CM | POA: Diagnosis not present

## 2022-02-10 DIAGNOSIS — Z79899 Other long term (current) drug therapy: Secondary | ICD-10-CM | POA: Insufficient documentation

## 2022-02-10 DIAGNOSIS — Z8261 Family history of arthritis: Secondary | ICD-10-CM | POA: Insufficient documentation

## 2022-02-10 DIAGNOSIS — Z836 Family history of other diseases of the respiratory system: Secondary | ICD-10-CM | POA: Insufficient documentation

## 2022-02-10 DIAGNOSIS — Z885 Allergy status to narcotic agent status: Secondary | ICD-10-CM | POA: Insufficient documentation

## 2022-02-10 DIAGNOSIS — J449 Chronic obstructive pulmonary disease, unspecified: Secondary | ICD-10-CM | POA: Diagnosis not present

## 2022-02-10 DIAGNOSIS — D509 Iron deficiency anemia, unspecified: Secondary | ICD-10-CM | POA: Insufficient documentation

## 2022-02-10 DIAGNOSIS — K589 Irritable bowel syndrome without diarrhea: Secondary | ICD-10-CM | POA: Insufficient documentation

## 2022-02-10 DIAGNOSIS — Z820 Family history of epilepsy and other diseases of the nervous system: Secondary | ICD-10-CM | POA: Diagnosis not present

## 2022-02-10 DIAGNOSIS — E538 Deficiency of other specified B group vitamins: Secondary | ICD-10-CM | POA: Diagnosis not present

## 2022-02-10 DIAGNOSIS — R635 Abnormal weight gain: Secondary | ICD-10-CM | POA: Insufficient documentation

## 2022-02-10 DIAGNOSIS — Z800 Family history of malignant neoplasm of digestive organs: Secondary | ICD-10-CM | POA: Insufficient documentation

## 2022-02-10 DIAGNOSIS — Z8249 Family history of ischemic heart disease and other diseases of the circulatory system: Secondary | ICD-10-CM | POA: Insufficient documentation

## 2022-02-10 DIAGNOSIS — D500 Iron deficiency anemia secondary to blood loss (chronic): Secondary | ICD-10-CM

## 2022-02-10 DIAGNOSIS — Z8711 Personal history of peptic ulcer disease: Secondary | ICD-10-CM | POA: Insufficient documentation

## 2022-02-10 DIAGNOSIS — Z8541 Personal history of malignant neoplasm of cervix uteri: Secondary | ICD-10-CM | POA: Insufficient documentation

## 2022-02-10 DIAGNOSIS — F17210 Nicotine dependence, cigarettes, uncomplicated: Secondary | ICD-10-CM | POA: Diagnosis not present

## 2022-02-10 DIAGNOSIS — K219 Gastro-esophageal reflux disease without esophagitis: Secondary | ICD-10-CM | POA: Diagnosis not present

## 2022-02-10 DIAGNOSIS — Z818 Family history of other mental and behavioral disorders: Secondary | ICD-10-CM | POA: Insufficient documentation

## 2022-02-10 LAB — IRON AND TIBC
Iron: 146 ug/dL (ref 28–170)
Saturation Ratios: 34 % — ABNORMAL HIGH (ref 10.4–31.8)
TIBC: 431 ug/dL (ref 250–450)
UIBC: 285 ug/dL

## 2022-02-10 LAB — CBC WITH DIFFERENTIAL/PLATELET
Abs Immature Granulocytes: 0.02 10*3/uL (ref 0.00–0.07)
Basophils Absolute: 0.1 10*3/uL (ref 0.0–0.1)
Basophils Relative: 1 %
Eosinophils Absolute: 0.1 10*3/uL (ref 0.0–0.5)
Eosinophils Relative: 1 %
HCT: 43.8 % (ref 36.0–46.0)
Hemoglobin: 15.3 g/dL — ABNORMAL HIGH (ref 12.0–15.0)
Immature Granulocytes: 0 %
Lymphocytes Relative: 30 %
Lymphs Abs: 2.8 10*3/uL (ref 0.7–4.0)
MCH: 32.6 pg (ref 26.0–34.0)
MCHC: 34.9 g/dL (ref 30.0–36.0)
MCV: 93.2 fL (ref 80.0–100.0)
Monocytes Absolute: 0.8 10*3/uL (ref 0.1–1.0)
Monocytes Relative: 9 %
Neutro Abs: 5.6 10*3/uL (ref 1.7–7.7)
Neutrophils Relative %: 59 %
Platelets: 365 10*3/uL (ref 150–400)
RBC: 4.7 MIL/uL (ref 3.87–5.11)
RDW: 13.2 % (ref 11.5–15.5)
WBC: 9.4 10*3/uL (ref 4.0–10.5)
nRBC: 0 % (ref 0.0–0.2)

## 2022-02-10 LAB — VITAMIN B12: Vitamin B-12: 746 pg/mL (ref 180–914)

## 2022-02-10 LAB — FERRITIN: Ferritin: 22 ng/mL (ref 11–307)

## 2022-02-12 ENCOUNTER — Other Ambulatory Visit: Payer: Self-pay | Admitting: Internal Medicine

## 2022-02-12 NOTE — Telephone Encounter (Signed)
Last filled 12-30-21 #90 ?Last OV 10-10-21 acute ?Next OV 07-07-22 ?[facility] ?

## 2022-02-13 ENCOUNTER — Inpatient Hospital Stay (HOSPITAL_BASED_OUTPATIENT_CLINIC_OR_DEPARTMENT_OTHER): Payer: Medicare HMO | Admitting: Nurse Practitioner

## 2022-02-13 ENCOUNTER — Encounter: Payer: Self-pay | Admitting: Nurse Practitioner

## 2022-02-13 DIAGNOSIS — E538 Deficiency of other specified B group vitamins: Secondary | ICD-10-CM

## 2022-02-13 DIAGNOSIS — D500 Iron deficiency anemia secondary to blood loss (chronic): Secondary | ICD-10-CM

## 2022-02-13 NOTE — Progress Notes (Signed)
? ?Hematology/Oncology Consult Note ?[place]  ?[phone] [phone] ? ?Virtual Visit Progress Note ? ?I connected with Molly Peters on 02/14/22 at  3:15 PM EDT by video enabled telemedicine visit and verified that I am speaking with the correct person using two identifiers.  ? ?I discussed the limitations, risks, security and privacy concerns of performing an evaluation and management service by telemedicine and the availability of in-person appointments. I also discussed with the patient that there may be a patient responsible charge related to this service. The patient expressed understanding and agreed to proceed.  ? ?Other persons participating in the visit and their role in the encounter: none  ? ?Patient?s location: home  ?Provider?s location: clinic  ? ? ?Patient Care Team: ?Venia Carbon, MD as PCP - General ?Virgel Manifold, MD (Inactive) as Consulting Physician (Gastroenterology) ?Vickie Epley, MD (Inactive) as Consulting Physician (General Surgery) ?Debbora Dus, [facility] as Pharmacist (Pharmacist) ?Sindy Guadeloupe, MD as Consulting Physician (Oncology)  ? ?Name of the patient: Molly Peters  ?[id number]  ?[birth year]  ? ?Date of visit: 02/13/22 ? ?Diagnosis-history of iron and B12 deficiency anemia ? ?Chief complaint/ Reason for visit-routine follow-up of anemia ? ?Heme/Onc history: Patient is a 61 year old female who is transferring her care from Dr. Mike Gip.  She has history of iron and B12 deficiency anemia.  She has received Venofer in the past and is on monthly B12 injections.EGD on 06/14/2018 revealed esophageal mucosal changes c/w short segment of Barrett's and a non-bleeding gastric ulcers.  Pathology at the GE junction revealed squamocolumnar mucosa with moderate chronic inflammation.  Gastric biopsy revealed no H pylori, dysplasia, or malignancy.  EGD on 05/04/2019 revealed non-bleeding gastric ulcer with a clean ulcer base  (Forrest Class III) and friable gastric mucosa.  Biopsies revealed antral mild reactive gastropathy with no H pylori, metaplasia, dysplasia or malignancy. ?  ?Colonoscopy on 06/15/2018 revealed a 5 mm polyp in the sigmoid colon (tubular adenoma).  Colonoscopy on 05/04/2019 revealed three 4 - 5 mm polyps in the sigmoid colon (hyperplastic polyps) and diverticulosis in the sigmoid colon.   ? ?Patient is also getting low-dose screening CT chest for lung nodules. ? ?Interval history- Patient is 61 year old female who agrees to evaluation via telemedicine for follow up for hx of iron deficiency anemia. She continues to feel well and denies specific complaints. No blood loss that she's aware of, melena, or hematochezia. Appetite is stable and endorses that she is gaining weight.  ? ?ECOG PS- 1 ?Pain scale- 0 ?  `1  ?Review of systems- Review of Systems  ?Constitutional:  Positive for malaise/fatigue. Negative for chills, fever and weight loss.  ?HENT:  Negative for congestion, ear discharge and nosebleeds.   ?Eyes:  Negative for blurred vision.  ?Respiratory:  Negative for cough, hemoptysis, sputum production, shortness of breath and wheezing.   ?Cardiovascular:  Negative for chest pain, palpitations, orthopnea and claudication.  ?Gastrointestinal:  Negative for abdominal pain, blood in stool, constipation, diarrhea, heartburn, melena, nausea and vomiting.  ?Genitourinary:  Negative for dysuria, flank pain, frequency, hematuria and urgency.  ?Musculoskeletal:  Negative for back pain, joint pain and myalgias.  ?Skin:  Negative for rash.  ?Neurological:  Negative for dizziness, tingling, focal weakness, seizures, weakness and headaches.  ?Endo/Heme/Allergies:  Does not bruise/bleed easily.  ?Psychiatric/Behavioral:  Negative for depression and suicidal ideas. The patient does not have insomnia.    ? ? ? ?Allergies  ?Allergen Reactions  ? Carbamazepine Hives  ?  Divalproex Sodium Swelling and Other (See Comments)  ?  HAIR  FALLS OUT  ? Clonazepam Other (See Comments)  ?  HALLUCINATIONS  ? Diphenhydramine Hcl Rash  ? Tiotropium Bromide Monohydrate Other (See Comments)  ?  CREATES YEAST IN THROAT  ? Vicodin [Hydrocodone-Acetaminophen] Itching  ? ? ? ?Past Medical History:  ?Diagnosis Date  ? Anxiety   ? Cervical cancer ([place]) 1980's  ? Chronic back pain 11/12/2009  ? Qualifier: Diagnosis of  By: Maxie Better FNP, Rosalita Levan   ? COPD (chronic obstructive pulmonary disease) ([place])   ? states SOB with ADLs; no O2 use; able to speak in complete sentences without SOB(11/15/2014)  ? Depression   ? Difficulty swallowing pills   ? s/p cervical fusion  ? Family history of adverse reaction to anesthesia   ? pt's mother and sister have hx. of post-op N/V  ? Fibromyalgia   ? GERD (gastroesophageal reflux disease)   ? Heart murmur   ? History of gastric ulcer   ? Hypertension   ? states under control with med., has been on med. x 1-2 yr.  ? IBS (irritable bowel syndrome)   ? Internal hemorrhoid   ? states has had intermittent bright red bleeding with BM (11/15/2014)  ? Intraductal papilloma of breast, right 08/08/2018  ? Limited joint range of motion   ? neck - s/p cervical fusion  ? 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 Osteoarthritis of left shoulder 07/05/2015  ? Osteoarthritis of right shoulder 11/2014  ? Pneumonia   ? Seizures ([place])   ? last seizure 2010  ? Short-term memory loss   ? TMJ syndrome   ? Valvular heart disease   ? Initial workup a number of years ago at [facility].  Echo (10/2009) showed EF 60-65%,      normal LV size, moderate aortic insufficiency with a trileaflet aortic valve and normal aortic root size.  Mild      mitral regurgitation.   ? Wears dentures   ? lower  ? ? ? ?Past Surgical History:  ?Procedure Laterality Date  ? ABDOMINAL HYSTERECTOMY    ? partial  ? ANTERIOR CERVICAL DECOMP/DISCECTOMY FUSION  02/06/2011  ? C4-5, C5-6, C6-7  ? BREAST BIOPSY Bilateral 10+ yrs ago  ? neg  ? BREAST BIOPSY Right  08/03/2018  ? 2 areas bx, -neg  ? BREAST LUMPECTOMY Bilateral 1989  ? x 3 - benign  ? BREAST LUMPECTOMY Right 08/24/2018  ? Procedure: BREAST LUMPECTOMY WITH ULTRASOUND IN OR;  Surgeon: Vickie Epley, MD;  Location: [facility] ORS;  Service: General;  Laterality: Right;  ? [place]  ? CESAREAN SECTION    ? x 2  ? COLONOSCOPY  09/28/2008  ? COLONOSCOPY N/A 06/15/2018  ? Procedure: COLONOSCOPY;  Surgeon: Virgel Manifold, MD;  Location: [facility] ENDOSCOPY;  Service: Endoscopy;  Laterality: N/A;  ? COLONOSCOPY WITH PROPOFOL N/A 05/04/2019  ? Procedure: COLONOSCOPY WITH PROPOFOL;  Surgeon: Virgel Manifold, MD;  Location: [facility] ENDOSCOPY;  Service: Endoscopy;  Laterality: N/A;  ? COLONOSCOPY WITH PROPOFOL N/A 04/21/2021  ? Procedure: COLONOSCOPY WITH PROPOFOL;  Surgeon: Virgel Manifold, MD;  Location: [facility] ENDOSCOPY;  Service: Endoscopy;  Laterality: N/A;  ? ESOPHAGOGASTRODUODENOSCOPY  09/28/2008  ? ESOPHAGOGASTRODUODENOSCOPY N/A 06/14/2018  ? Procedure: ESOPHAGOGASTRODUODENOSCOPY (EGD);  Surgeon: Virgel Manifold, MD;  [facility] ENDOSCOPY;  Service: Endoscopy;  Laterality: N/A;  ? ESOPHAGOGASTRODUODENOSCOPY (EGD) WITH PROPOFOL N/A 05/04/2019  ? Procedure: ESOPHAGOGASTRODUODENOSCOPY (EGD) WITH PROPOFOL;  Surgeon:  Virgel Manifold, MD;  Location: [facility] ENDOSCOPY;  Service: Endoscopy;  Laterality: N/A;  ? ESOPHAGOGASTRODUODENOSCOPY (EGD) WITH PROPOFOL N/A 03/18/2020  ? Procedure: ESOPHAGOGASTRODUODENOSCOPY (EGD) WITH PROPOFOL;  Surgeon: Lin Landsman, MD;  Location: [facility] ENDOSCOPY;  Service: Gastroenterology;  Laterality: N/A;  ? ESOPHAGOGASTRODUODENOSCOPY (EGD) WITH PROPOFOL N/A 04/21/2021  ? Procedure: ESOPHAGOGASTRODUODENOSCOPY (EGD) WITH PROPOFOL;  Surgeon: Virgel Manifold, MD;  Location: [facility] ENDOSCOPY;  Service: Endoscopy;  Laterality: N/A;  ? HARDWARE REMOVAL  11/17/2006  ? L5-S1  ? LAMINECTOMY WITH POSTERIOR LATERAL ARTHRODESIS LEVEL 3  12/06/2009  ? with synovial cyst  resection L3-4 bilat.  ? LAMINECTOMY WITH POSTERIOR LATERAL ARTHRODESIS LEVEL 3  11/17/2006  ? L4-5  ? NM MYOVIEW LTD    ? Lexiscan myoview (10/2009): EF 77%, normal wall motion, normal perfusion.   ? SHOULDER ARTHROSCOPY WIT

## 2022-02-13 NOTE — Progress Notes (Signed)
Pt c/o intermittent nausea relieved by nausea. Right arm surgery next week d/t past rotator cuff injury. Pt states she has daily constant left posterior head "swelling feeling and feels like a worm is crawling up and down in that area." Ongoing for months, has not seen provider for these symptoms. Does report multiple falls from "dog knocking me over" and "hitting my head on a board when I stand up."  ?

## 2022-02-14 ENCOUNTER — Encounter: Payer: Self-pay | Admitting: Urgent Care

## 2022-02-14 MED ORDER — CYANOCOBALAMIN 1000 MCG/ML IJ SOLN: 1000.0000 ug | INTRAMUSCULAR | 0 refills | Status: AC

## 2022-02-14 MED ORDER — "SYRINGE 25G X 1"" 3 ML MISC": 3.0000 mL | 0 refills | Status: DC

## 2022-02-18 DIAGNOSIS — I10 Essential (primary) hypertension: Secondary | ICD-10-CM | POA: Diagnosis not present

## 2022-02-18 DIAGNOSIS — G8918 Other acute postprocedural pain: Secondary | ICD-10-CM | POA: Diagnosis not present

## 2022-02-18 DIAGNOSIS — Z20822 Contact with and (suspected) exposure to covid-19: Secondary | ICD-10-CM | POA: Diagnosis not present

## 2022-02-18 DIAGNOSIS — T84098A Other mechanical complication of other internal joint prosthesis, initial encounter: Secondary | ICD-10-CM | POA: Diagnosis not present

## 2022-02-18 DIAGNOSIS — F17210 Nicotine dependence, cigarettes, uncomplicated: Secondary | ICD-10-CM | POA: Diagnosis not present

## 2022-02-18 DIAGNOSIS — Z471 Aftercare following joint replacement surgery: Secondary | ICD-10-CM | POA: Diagnosis not present

## 2022-02-18 DIAGNOSIS — J449 Chronic obstructive pulmonary disease, unspecified: Secondary | ICD-10-CM | POA: Diagnosis not present

## 2022-02-18 DIAGNOSIS — K219 Gastro-esophageal reflux disease without esophagitis: Secondary | ICD-10-CM | POA: Diagnosis not present

## 2022-02-18 DIAGNOSIS — F39 Unspecified mood [affective] disorder: Secondary | ICD-10-CM | POA: Diagnosis not present

## 2022-02-18 DIAGNOSIS — M75101 Unspecified rotator cuff tear or rupture of right shoulder, not specified as traumatic: Secondary | ICD-10-CM | POA: Diagnosis not present

## 2022-02-18 DIAGNOSIS — F419 Anxiety disorder, unspecified: Secondary | ICD-10-CM | POA: Diagnosis not present

## 2022-02-18 DIAGNOSIS — E785 Hyperlipidemia, unspecified: Secondary | ICD-10-CM | POA: Diagnosis not present

## 2022-02-18 DIAGNOSIS — G40909 Epilepsy, unspecified, not intractable, without status epilepticus: Secondary | ICD-10-CM | POA: Diagnosis not present

## 2022-02-18 DIAGNOSIS — T8484XA Pain due to internal orthopedic prosthetic devices, implants and grafts, initial encounter: Secondary | ICD-10-CM | POA: Diagnosis not present

## 2022-02-18 DIAGNOSIS — Z96611 Presence of right artificial shoulder joint: Secondary | ICD-10-CM | POA: Diagnosis not present

## 2022-03-05 ENCOUNTER — Other Ambulatory Visit: Payer: Self-pay | Admitting: Internal Medicine

## 2022-03-05 ENCOUNTER — Telehealth: Payer: Self-pay

## 2022-03-05 NOTE — Telephone Encounter (Signed)
Spoke to pt. She said she is doing well. Much better this tme than the last time. ?

## 2022-03-05 NOTE — Telephone Encounter (Signed)
-----   Message from Venia Carbon, MD sent at 02/24/2022  8:28 AM EDT ----- ?Had shoulder surgery. ?Please check on her ?----- Message ----- ?From: Pilar Grammes, CMA ?Sent: 02/24/2022   8:26 AM EDT ?To: Venia Carbon, MD ? ? ?----- Message ----- ?From: Dalene Carrow D ?Sent: 02/20/2022   3:03 PM EDT ?To: Pilar Grammes, CMA ? ? ? ?

## 2022-03-08 ENCOUNTER — Other Ambulatory Visit: Payer: Self-pay | Admitting: Internal Medicine

## 2022-03-25 DIAGNOSIS — Z96611 Presence of right artificial shoulder joint: Secondary | ICD-10-CM | POA: Diagnosis not present

## 2022-03-27 DIAGNOSIS — Z96611 Presence of right artificial shoulder joint: Secondary | ICD-10-CM | POA: Diagnosis not present

## 2022-04-01 DIAGNOSIS — Z96611 Presence of right artificial shoulder joint: Secondary | ICD-10-CM | POA: Diagnosis not present

## 2022-04-02 ENCOUNTER — Other Ambulatory Visit: Payer: Self-pay | Admitting: Internal Medicine

## 2022-04-02 NOTE — Telephone Encounter (Signed)
Last filled 02-13-22 #90 Last OV 10-10-21 acute Next OV 07-07-22 [facility]

## 2022-04-03 ENCOUNTER — Telehealth: Payer: Self-pay | Admitting: *Deleted

## 2022-04-03 DIAGNOSIS — Z96611 Presence of right artificial shoulder joint: Secondary | ICD-10-CM | POA: Diagnosis not present

## 2022-04-03 NOTE — Telephone Encounter (Signed)
LMTC to schedule Yearly Lung CA CT Scan. 

## 2022-04-07 DIAGNOSIS — Z96611 Presence of right artificial shoulder joint: Secondary | ICD-10-CM | POA: Diagnosis not present

## 2022-04-08 DIAGNOSIS — M25511 Pain in right shoulder: Secondary | ICD-10-CM | POA: Diagnosis not present

## 2022-04-08 DIAGNOSIS — Z471 Aftercare following joint replacement surgery: Secondary | ICD-10-CM | POA: Diagnosis not present

## 2022-04-08 DIAGNOSIS — Z96611 Presence of right artificial shoulder joint: Secondary | ICD-10-CM | POA: Diagnosis not present

## 2022-04-08 DIAGNOSIS — M25311 Other instability, right shoulder: Secondary | ICD-10-CM | POA: Diagnosis not present

## 2022-04-09 ENCOUNTER — Ambulatory Visit: Payer: Medicare HMO | Admitting: Pharmacist

## 2022-04-09 ENCOUNTER — Telehealth: Payer: Self-pay

## 2022-04-09 DIAGNOSIS — F17200 Nicotine dependence, unspecified, uncomplicated: Secondary | ICD-10-CM

## 2022-04-09 DIAGNOSIS — J4489 Other specified chronic obstructive pulmonary disease: Secondary | ICD-10-CM

## 2022-04-09 DIAGNOSIS — F39 Unspecified mood [affective] disorder: Secondary | ICD-10-CM

## 2022-04-09 DIAGNOSIS — E782 Mixed hyperlipidemia: Secondary | ICD-10-CM

## 2022-04-09 DIAGNOSIS — J449 Chronic obstructive pulmonary disease, unspecified: Secondary | ICD-10-CM

## 2022-04-09 DIAGNOSIS — I10 Essential (primary) hypertension: Secondary | ICD-10-CM

## 2022-04-09 MED ORDER — ALBUTEROL SULFATE HFA 108 (90 BASE) MCG/ACT IN AERS: INHALATION_SPRAY | RESPIRATORY_TRACT | 1 refills | Status: AC

## 2022-04-09 NOTE — Progress Notes (Signed)
Chronic Care Management Pharmacy Note  04/10/2022 Name:  Molly Peters MRN:  [id number] DOB:  11-06-1961  Summary: CCM F/U visit - Patient unable to afford Spiriva. She did notice some improvement with Spiriva but stopped taking due to cost. PAP paperwork in process. -She also used to be on statin (unclear why discontinued 2017) and lipid panel has not been updated since 2015 (LDL was 82 while on a statin); LDL peak was 155 in 2010.  Recommendations/Changes made from today's visit: -Pursue PAP for Spiriva -Recommend lipid panel at next office visit   Plan: -Molly Peters will follow up with Spiriva manufacturer 1 week -Pharmacist follow up televisit scheduled for 6 months -Molly Peters visit 06/17/22    Subjective: Molly Peters is an 61 y.o. year old female who is a primary patient of Molly Carbon, MD.  The CCM team was consulted for assistance with disease management and care coordination needs.    Engaged with patient by telephone for follow up visit in response to provider referral for pharmacy case management and/or care coordination services.   Consent to Services:  The patient was given information about Chronic Care Management services, agreed to services, and gave verbal consent prior to initiation of services.  Please see initial visit note for detailed documentation.   Patient Care Team: Molly Carbon, MD as Molly Peters - General Molly Manifold, MD (Inactive) as Consulting Physician (Gastroenterology) Molly Epley, MD (Inactive) as Consulting Physician (General Surgery) Molly Guadeloupe, MD as Consulting Physician (Oncology) Molly Peters, [facility] as Pharmacist (Pharmacist)  Recent office visits:  10/10/21 Dr Molly Peters OV: acute visit for rash. Rx prednixone 40 mg x 5 days, try cetirizine or fexofenadine. If no improvement, set up allergy appt.  06/30/21 - Molly Peters - Pt presented for routine medical visit. COPD, She was unable to afford Spiriva. Continues  albuterol. HTN, good today. Malnutrition, add Ensure for skipped meals. Seizure free on Lamictal. Mood disorder stable on Xanax, paroxetine. RTC 1 year.   Recent consult visits:  04/08/22 Dr Molly Peters (ortho): s/p shoulder rep. Overall doing good. RTC 2 months. 03/09/22 Dr Molly Peters (Ortho): s/p shoulder replacement. Rx gabapentin and oxycodone #10 tablets (last narcotic Rx) 02/13/22 NP Molly Peters (Molly Peters): f/u IDA. Take iron QOD.  12/24/21 Dr Molly Peters (Ortho): s/p shoulder replacement. 12/12/21 PA Molly Peters (ortho): shoulder surgery. Referred to sports surgeon for evaluation. 11/11/21 PA Molly Peters (Ortho): shoulder surgery. Rx'd methocarbamol and naproxen. Referred to PT 08/06/2021 - Molly Peters - Patient presented for Iron Deficiency.    Hospital visits:  02/18/22 Admission for shoulder replacement revision.   Objective:  Lab Results  Component Value Date   CREATININE 0.78 08/06/2021   BUN 13 08/06/2021   GFR 69.90 06/30/2021   GFRNONAA >60 08/06/2021   GFRAA >60 08/12/2020   NA 137 08/06/2021   K 3.4 (L) 08/06/2021   CALCIUM 9.4 08/06/2021   CO2 26 08/06/2021   GLUCOSE 98 08/06/2021    Lab Results  Component Value Date/Time   GFR 69.90 06/30/2021 03:28 PM   GFR 55.36 (L) 06/20/2019 11:41 AM    Lab Results  Component Value Date   CHOL 148 04/05/2014   HDL 42.70 04/05/2014   LDLCALC 82 04/05/2014   LDLDIRECT 155.7 10/10/2009   TRIG 116.0 04/05/2014   CHOLHDL 3 04/05/2014       Latest Ref Rng & Units 08/06/2021    1:03 PM 06/30/2021    3:28 PM 01/27/2021    1:02 PM  Hepatic  Function  Total Protein 6.5 - 8.1 g/dL 7.9   7.1   8.3    Albumin 3.5 - 5.0 g/dL 4.2   4.2   4.6    AST 15 - 41 U/L '23   18   26    ' ALT 0 - 44 U/L '10   8   12    ' Alk Phosphatase 38 - 126 U/L 87   74   72    Total Bilirubin 0.3 - 1.2 mg/dL 0.7   0.5   0.5      Lab Results  Component Value Date/Time   TSH 1.256 07/05/2018 12:35 PM   TSH 0.30 (L) 04/04/2014 01:02 PM   TSH 0.43 04/05/2013 11:50  AM   FREET4 0.77 06/30/2021 03:28 PM   FREET4 0.67 01/26/2020 01:07 PM       Latest Ref Rng & Units 02/10/2022    2:52 PM 08/06/2021    1:03 PM 06/30/2021    3:28 PM  CBC  WBC 4.0 - 10.5 K/uL 9.4   7.7   7.5    Hemoglobin 12.0 - 15.0 g/dL 15.3   14.8   15.1    Hematocrit 36.0 - 46.0 % 43.8   41.3   42.9    Platelets 150 - 400 K/uL 365   355   394.0      Lab Results  Component Value Date/Time   VD25OH 10.5 (L) 02/18/2016 02:16 PM   VD25OH - Total on 03/12/20 38 (Gastroenterology)  Clinical ASCVD: Yes  The ASCVD Risk score (Arnett DK, et al., 2019) failed to calculate for the following reasons:   Cannot find a previous HDL lab   Cannot find a previous total cholesterol lab       06/30/2021    3:10 PM 06/25/2020   12:33 PM 06/20/2019   11:34 AM  Depression screen PHQ 2/9  Decreased Interest 0 0 0  Down, Depressed, Hopeless 0 1 0  PHQ - 2 Score 0 1 0    Social History   Tobacco Use  Smoking Status Every Day   Packs/day: 1.00   Years: 42.75   Pack years: 42.75   Types: Cigarettes  Smokeless Tobacco Never   BP Readings from Last 3 Encounters:  10/10/21 138/86  08/06/21 (!) 158/104  06/30/21 128/82   Pulse Readings from Last 3 Encounters:  10/10/21 82  08/06/21 72  06/30/21 68   Wt Readings from Last 3 Encounters:  10/10/21 97 lb (44 kg)  08/06/21 94 lb 5.7 oz (42.8 kg)  06/30/21 93 lb 12 oz (42.5 kg)   BMI Readings from Last 3 Encounters:  10/10/21 18.94 kg/m  08/06/21 18.74 kg/m  06/30/21 18.62 kg/m    Assessment/Interventions: Review of patient past medical history, allergies, medications, health status, including review of consultants reports, laboratory and other test data, was performed as part of comprehensive evaluation and provision of chronic care management services.   SDOH:  (Social Determinants of Health) assessments and interventions performed: Yes SDOH Interventions    Flowsheet Row Most Recent Value  SDOH Interventions   Food Insecurity  Interventions Intervention Not Indicated  Financial Strain Interventions Other (Comment)  [Spriva PAP in process]        SDOH Screenings   Alcohol Screen: Not on file  Depression (PHQ2-9): Low Risk    PHQ-2 Score: 0  Financial Resource Strain: Medium Risk   Difficulty of Paying Living Expenses: Somewhat hard  Food Insecurity: No Food Insecurity   Worried About Running  Out of Food in the Last Year: Never true   Ran Out of Food in the Last Year: Never true  Housing: Not on file  Physical Activity: Not on file  Social Connections: Not on file  Stress: Not on file  Tobacco Use: High Risk   Smoking Tobacco Use: Every Day   Smokeless Tobacco Use: Never   Passive Exposure: Not on file  Transportation Needs: Not on file    [place]  Allergies  Allergen Reactions   Carbamazepine Hives   Divalproex Sodium Swelling and Other (See Comments)    HAIR FALLS OUT   Clonazepam Other (See Comments)    HALLUCINATIONS   Diphenhydramine Hcl Rash   Tiotropium Bromide Monohydrate Other (See Comments)    CREATES YEAST IN THROAT   Vicodin [Hydrocodone-Acetaminophen] Itching    Medications Reviewed Today     Reviewed by Molly Peters, [facility] (Pharmacist) on 04/10/22 at [place] List Status: <None>   Medication Order Taking? Sig Documenting Provider Last Dose Status Informant  albuterol (VENTOLIN HFA) 108 (90 Base) MCG/ACT inhaler [id number] Yes INHALE 2 PUFFS BY MOUTH EVERY 6 HOURS ASNEEDED WHEEEZING/ SHORTNESS OF BREATH Molly Carbon, MD Taking Active   ALPRAZolam Duanne Moron) 1 MG tablet [id number] Yes TAKE 1/2 TO 1 TABLET BY MOUTH 3 TIMES DAILY AS NEEDED Molly Carbon, MD Taking Active   cyanocobalamin (,VITAMIN B-12,) 1000 MCG/ML injection [id number] Yes Inject 1 mL (1,000 mcg total) into the muscle every 30 (thirty) days for 12 doses. Verlon Au, NP Taking Active   fluticasone [facility]) 50 MCG/ACT nasal spray [id number] Yes Place 2 sprays into both nostrils daily. [provider] Taking Active   lamoTRIgine (LAMICTAL) 100 MG tablet [id number] Yes TAKE 1 TABLET BY MOUTH TWICE DAILY Molly Carbon, MD Taking Active   losartan-hydrochlorothiazide [facility]) 100-12.5 MG tablet [id number] Yes TAKE 1 TABLET BY MOUTH ONCE DAILY Molly Carbon, MD Taking Active   methocarbamol (ROBAXIN) 500 MG tablet [id number] Yes Take 1,000 mg by mouth 3 (three) times daily as needed for muscle spasms. For 7 days (21 tablets prescribed at ED 07/12/21) [provider] Taking Active Self  ondansetron (ZOFRAN-ODT) 4 MG disintegrating tablet [id number] Yes DISSOLVE 1 TABLET ON THE TONGUE EVERY 8 HOURS AS NEEDED FOR NAUSEA OR VOMIT Molly Carbon, MD Taking Active   pantoprazole (PROTONIX) 40 MG tablet [id number] Yes Take 1 tablet (40 mg total) by mouth 2 (two) times daily before a meal. [name] B, MD Taking Active   PARoxetine (PAXIL) 20 MG tablet [id number] Yes Take 1 tablet (20 mg total) by mouth daily. Molly Carbon, MD Taking Active   Syringe/Needle, Disp, (SYRINGE 3CC/25GX1") 25G X 1" 3 ML MISC [id number] Yes 3 mLs by Does not apply route every 30 (thirty) days. Verlon Au, NP Taking Active   Tiotropium Bromide Monohydrate (SPIRIVA RESPIMAT) 2.5 MCG/ACT AERS [id number] Yes Inhale 2 puffs into the lungs daily. [provider]  Active   Med List Note Dewayne Shorter, RN 01/29/16 1519): UDS 01-29-16            Patient Active Problem List   Diagnosis Date Noted   Urticaria 10/10/2021   Benzodiazepine dependence ([place]) 06/30/2021   Acute gastric ulcer without hemorrhage or perforation    Acute gastric erosion    Malnutrition of mild degree ([place]) 06/25/2020   Personal history of colonic polyps    Diverticulosis of large intestine without diverticulitis    Pulmonary nodules 08/08/2018  B12 deficiency 07/21/2018   Iron deficiency anemia due to chronic blood loss 07/05/2018   Polyp of colon    Acute peptic ulcer of stomach    Advance directive  discussed with patient 10/27/2017   Neuropathy 08/11/2016   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   S/P Bilateral Total Shoulder Replacement (2016) 11/23/2014   Osteoarthritis of shoulder (Right) 04/13/2014   Routine general medical examination at a health care facility 04/05/2013   Restless legs syndrome (RLS) 10/21/2010   Essential hypertension, benign 06/23/2010   HYPERLIPIDEMIA-MIXED 10/23/2009   Valvular heart disease 03/27/2009   IRRITABLE BOWEL SYNDROME 11/13/2008   Mood disorder ([place]) 09/07/2007   Tobacco use disorder 09/07/2007   GLAUCOMA 09/07/2007   HEMORRHOIDS 09/07/2007   Esophagogastric ulcer 09/07/2007   COPD (chronic obstructive pulmonary disease) with chronic bronchitis ([place]) 08/08/2007   GERD 08/08/2007   Seizure disorder ([place]) 08/08/2007    Immunization History  Administered Date(s) Administered   Influenza Split 09/28/2011   Influenza Whole 10/03/2007, 09/20/2009, 10/21/2010   Influenza, Quadrivalent, Recombinant, Inj, Pf 08/23/2020   Influenza,inj,Quad PF,6+ Mos 08/11/2016, 10/27/2017   Pneumococcal Conjugate-13 10/27/2017   Pneumococcal Polysaccharide-23 10/09/2009   Td 01/09/2003    Conditions to be addressed/monitored:  Hypertension, Hyperlipidemia, GERD, COPD, Anxiety, Osteoarthritis, Tobacco use, and Iron deficiency  Care Plan : [place]  Updates made by Molly Peters, [place] since 04/10/2022 12:00 AM     Problem: Hypertension, Hyperlipidemia, GERD, COPD, Anxiety, Osteoarthritis, Tobacco use, and Iron deficiency   Priority: High  Onset Date: 06/10/2021     Long-Range Goal: Disease mgmt   Start Date: 10/20/2021  Expected End Date: 04/11/2023  This Visit's Progress: On track  Recent Progress: On track  Priority: High  Note:   Current Barriers:  Cost -  Spiriva  Pharmacist Clinical Goal(s):  Patient will contact provider office for questions/concerns as evidenced notation of same in electronic health record through collaboration with PharmD and provider.   Interventions: 1:1 collaboration with Molly Carbon, MD regarding development and update of comprehensive plan of care as evidenced by provider attestation and co-signature Inter-disciplinary care team collaboration (see longitudinal plan of care) Comprehensive medication review performed; medication list updated in electronic medical record  Hypertension  (BP goal <140/90) -Controlled - per recent clinic readings -Current home readings: n/a -Current treatment: Losartan-HCTZ 100- 12.5 mg - 1/2 tab daily - Appropriate, Effective, Safe, Accessible -Medications previously tried: none  -Educated on BP goals and benefits of medications for prevention of heart attack, stroke and kidney damage -Recommended to continue current medication  Hyperlipidemia: (LDL goal < 100) -Query controlled -  last lipid panel 2015 and patient was on simvastatin at the time. Reason for d/c not documented. LDL was up to 155, Trig 244 in 2010. -Current treatment: None -Medications previously tried:  simvastatin 20 mg (d/c'd per pain mgmt 2017 "error") - no adverse effects documented -Recommend to update lipid panel; Patient does not like to drive/gas money so we will wait until next appt  COPD (Goal: control symptoms and prevent exacerbations) -Not ideally controlled - pt reports using albuterol ~3 times a week; she noted improvement on Spiriva x 1 month but could not afford it, PAP forms have been lost -Initial pulmonology consult completed 08/23/20, Spiriva started. Pt completed PFTs but no follow up since then. PFTs considered  normal. Pt has not scheduled follow up. -Gold Grade: Gold 1 (FEV1>80%) -Current COPD Classification:  B (high sx, <2 exacerbations/yr) -Pulmonary function testing: 09/2020; FEV1  73% predicted, ratio 0.69;  change -2%, no evidence of disease -Current treatment  Albuterol PRN - Appropriate, Effective, Safe, Accessible Spiriva Respimat 2.5 mcg/act - working on PAP  - Appropriate, Effective, Safe, InAccessible -Medications previously tried: Advair, Symbicort, Spiriva DPI - thrush -Exacerbations requiring treatment in last 6 months: none -Patient denies consistent use of maintenance inhaler -Counseled on Benefits of consistent maintenance inhaler use -Recommended to continue current medication; refill albuterol per pt request and f/u PAP approval for Spiriva  Tobacco use (Goal: quit smoking) -Current smoker - 3/4 PPD -Quit history -she quit for 15 months about 20 years ago with nicotine patches/inhaler, went back to work and coworkers smoked. -Counseled on benefits of smoking cessation, offered support if she decides to quit; pt voiced understanding and will think about quitting  Depression/Anxiety (Goal: Improve mood) -Controlled - per patient report; she takes Xanax 1 whole tablet twice daily and fill dates support this -PHQ9: 0 (06/2021) - minimal depression -Family stressors but overall stable, doing well on current therapy -Current treatment: Paroxetine 20 mg daily - Appropriate, Effective, Safe, Accessible Alprazolam 1 mg - 1/2-1 tablet TID PRN - Appropriate, Effective, Query Safe -Medications previously tried/failed: none -Reviewed long term risks associated with benzodiazepines; she has been on alprazolam since at least 2012, tapering at this point may cause more harm than good, will reassess periodically -Recommended to continue current medication.  Iron Deficiency (Goal: Iron panel WNL) -Managed by hematology/oncology -She takes oral iron and gets infusions -Current treatment  Ferrous sulfate 325 mg QOD -Medications previously tried: none reported  -Recommended to continue current medication  Health Maintenance -Vaccine gaps: Prevnar20 or PPSV23,  Shingrix, TD -Advised to get Pneumonia vaccine at next Molly Peters visit (Prevnar 20 preferred, if unavailable PPSV23)  Patient Goals/Self-Care Activities Patient will:  - take medications as prescribed as evidenced by patient report and record review focus on medication adherence by routine collaborate with provider on medication access solutions (Spiriva)      Medication Assistance:  Spiriva ([employer]) PAP - in process. Pt received forms 10/13/21  Compliance/Adherence/Medication fill history: Care Gaps: Mammogram due - October 2022  Star-Rating Drugs: Losartan/HCTZ - PDC 53%; LF 03/05/22 -[facility]  Medication Access: Within the past 30 days, how often has patient missed a dose of medication? 0 Is a pillbox or other method used to improve adherence? Yes  Factors that may affect medication adherence? financial need Are meds synced by current pharmacy? No  Are meds delivered by current pharmacy? Yes  Does patient experience delays in picking up medications due to transportation concerns? No   [facility] Services Reviewed: Is patient disadvantaged to use [facility]?: Yes  Current Rx insurance plan: [employer] [place] Name and location of Current pharmacy:  [place], [place] [place] [address] Phone: [phone] Fax: [phone]  [facility] services reviewed with patient today?: No  Patient requests to transfer care to [facility] Pharmacy?: No  Reason patient declined to change pharmacies: Loyalty to other pharmacy/Patient preference   Care Plan and Follow Up Patient Decision:  Patient agrees to Care Plan and Follow-up.  Follow Up Plan: Telephone follow up appointment with care management team member scheduled for: 6 months  [name], PharmD, BCACP Clinical Pharmacist [facility] [phone]

## 2022-04-09 NOTE — Chronic Care Management (AMB) (Signed)
    Chronic Care Management Pharmacy Assistant   Name: Molly Peters  MRN: [id number] DOB: [birth year]   Reason for Encounter: Reminder Call   Conditions to be addressed/monitored: HTN and HLD   Medications: Outpatient Encounter Medications as of 04/09/2022  Medication Sig   albuterol (VENTOLIN HFA) 108 (90 Base) MCG/ACT inhaler INHALE 2 PUFFS BY MOUTH EVERY 6 HOURS ASNEEDED WHEEEZING/ SHORTNESS OF BREATH   ALPRAZolam (XANAX) 1 MG tablet TAKE 1/2 TO 1 TABLET BY MOUTH 3 TIMES DAILY AS NEEDED   cyanocobalamin (,VITAMIN B-12,) 1000 MCG/ML injection Inject 1 mL (1,000 mcg total) into the muscle every 30 (thirty) days for 12 doses.   fluticasone (FLONASE) 50 MCG/ACT nasal spray Place 2 sprays into both nostrils daily.   lamoTRIgine (LAMICTAL) 100 MG tablet TAKE 1 TABLET BY MOUTH TWICE DAILY   losartan-hydrochlorothiazide (HYZAAR) 100-12.5 MG tablet TAKE 1 TABLET BY MOUTH ONCE DAILY   methocarbamol (ROBAXIN) 500 MG tablet Take 1,000 mg by mouth 3 (three) times daily as needed for muscle spasms. For 7 days (21 tablets prescribed at ED 07/12/21)   morphine (MSIR) 30 MG tablet Take 30 mg by mouth every 4 (four) hours as needed for severe pain. Up to 5 days (prescribed at ED 07/12/21) (Patient not taking: Reported on 02/13/2022)   ondansetron (ZOFRAN-ODT) 4 MG disintegrating tablet DISSOLVE 1 TABLET ON THE TONGUE EVERY 8 HOURS AS NEEDED FOR NAUSEA OR VOMIT   pantoprazole (PROTONIX) 40 MG tablet Take 1 tablet (40 mg total) by mouth 2 (two) times daily before a meal.   PARoxetine (PAXIL) 20 MG tablet Take 1 tablet (20 mg total) by mouth daily.   Syringe/Needle, Disp, (SYRINGE 3CC/25GX1") 25G X 1" 3 ML MISC 3 mLs by Does not apply route every 30 (thirty) days.   No facility-administered encounter medications on file as of 04/09/2022.   RAELENE TREW was contacted to remind of upcoming telephone visit with [name] on 04/09/22 at 3:00pm. Patient was reminded to have any blood glucose and blood  pressure readings available for review at appointment.  Please call [phone]  Patient confirmed appointment.   Are you having any problems with your medications? No   Do you have any concerns you like to discuss with the pharmacist? No   CCM referral has been placed prior to visit?  No    Star Rating Drugs: Medication:  Last Fill: Day Supply Losartan/HCTZ  03/05/22 [place], CPP notified  Avel Sensor, [place]  [phone]

## 2022-04-10 NOTE — Patient Instructions (Signed)
Visit Information  Phone number for Pharmacist: [phone]   Goals Addressed   None     Care Plan : [place]  Updates made by Charlton Haws, [facility] since 04/10/2022 12:00 AM     Problem: Hypertension, Hyperlipidemia, GERD, COPD, Anxiety, Osteoarthritis, Tobacco use, and Iron deficiency   Priority: High  Onset Date: 06/10/2021     Long-Range Goal: Disease mgmt   Start Date: 10/20/2021  Expected End Date: 04/11/2023  This Visit's Progress: On track  Recent Progress: On track  Priority: High  Note:   Current Barriers:  Cost - Spiriva  Pharmacist Clinical Goal(s):  Patient will contact provider office for questions/concerns as evidenced notation of same in electronic health record through collaboration with PharmD and provider.   Interventions: 1:1 collaboration with Venia Carbon, MD regarding development and update of comprehensive plan of care as evidenced by provider attestation and co-signature Inter-disciplinary care team collaboration (see longitudinal plan of care) Comprehensive medication review performed; medication list updated in electronic medical record  Hypertension  (BP goal <140/90) -Controlled - per recent clinic readings -Current home readings: n/a -Current treatment: Losartan-HCTZ 100- 12.5 mg - 1/2 tab daily - Appropriate, Effective, Safe, Accessible -Medications previously tried: none  -Educated on BP goals and benefits of medications for prevention of heart attack, stroke and kidney damage -Recommended to continue current medication  Hyperlipidemia: (LDL goal < 100) -Query controlled -  last lipid panel 2015 and patient was on simvastatin at the time. Reason for d/c not documented. LDL was up to 155, Trig 244 in 2010. -Current treatment: None -Medications previously tried:  simvastatin 20 mg (d/c'd per pain mgmt 2017 "error") - no adverse effects documented -Recommend to update lipid panel; Patient does not like to drive/gas money  so we will wait until next appt  COPD (Goal: control symptoms and prevent exacerbations) -Not ideally controlled - pt reports using albuterol ~3 times a week; she noted improvement on Spiriva x 1 month but could not afford it, PAP forms have been lost -Initial pulmonology consult completed 08/23/20, Spiriva started. Pt completed PFTs but no follow up since then. PFTs considered normal. Pt has not scheduled follow up. -Gold Grade: Gold 1 (FEV1>80%) -Current COPD Classification:  B (high sx, <2 exacerbations/yr) -Pulmonary function testing: 09/2020; FEV1 73% predicted, ratio 0.69;  change -2%, no evidence of disease -Current treatment  Albuterol PRN - Appropriate, Effective, Safe, Accessible Spiriva Respimat 2.5 mcg/act - working on PAP  - Appropriate, Effective, Safe, InAccessible -Medications previously tried: Advair, Symbicort, Spiriva DPI - thrush -Exacerbations requiring treatment in last 6 months: none -Patient denies consistent use of maintenance inhaler -Counseled on Benefits of consistent maintenance inhaler use -Recommended to continue current medication; refill albuterol per pt request and f/u PAP approval for Spiriva  Tobacco use (Goal: quit smoking) -Current smoker - 3/4 PPD -Quit history -she quit for 15 months about 20 years ago with nicotine patches/inhaler, went back to work and coworkers smoked. -Counseled on benefits of smoking cessation, offered support if she decides to quit; pt voiced understanding and will think about quitting  Depression/Anxiety (Goal: Improve mood) -Controlled - per patient report; she takes Xanax 1 whole tablet twice daily and fill dates support this -PHQ9: 0 (06/2021) - minimal depression -Family stressors but overall stable, doing well on current therapy -Current treatment: Paroxetine 20 mg daily - Appropriate, Effective, Safe, Accessible Alprazolam 1 mg - 1/2-1 tablet TID PRN - Appropriate, Effective, Query Safe -Medications previously  tried/failed: none -Reviewed long  term risks associated with benzodiazepines; she has been on alprazolam since at least 2012, tapering at this point may cause more harm than good, will reassess periodically -Recommended to continue current medication.  Iron Deficiency (Goal: Iron panel WNL) -Managed by hematology/oncology -She takes oral iron and gets infusions -Current treatment  Ferrous sulfate 325 mg QOD -Medications previously tried: none reported  -Recommended to continue current medication  Health Maintenance -Vaccine gaps: Prevnar20 or PPSV23, Shingrix, TD -Advised to get Pneumonia vaccine at next PCP visit (Prevnar 20 preferred, if unavailable PPSV23)  Patient Goals/Self-Care Activities Patient will:  - take medications as prescribed as evidenced by patient report and record review focus on medication adherence by routine collaborate with provider on medication access solutions (Spiriva)       Patient verbalizes understanding of instructions and care plan provided today and agrees to view in [place]. Active MyChart status and patient understanding of how to access instructions and care plan via MyChart confirmed with patient.    Telephone follow up appointment with pharmacy team member scheduled for: 6 months  [name], PharmD, [facility] Clinical Pharmacist [facility] [phone]

## 2022-04-21 DIAGNOSIS — Z96611 Presence of right artificial shoulder joint: Secondary | ICD-10-CM | POA: Diagnosis not present

## 2022-05-08 ENCOUNTER — Other Ambulatory Visit: Payer: Self-pay | Admitting: Internal Medicine

## 2022-05-08 NOTE — Telephone Encounter (Signed)
Last filled 04-02-22 #90 Last OV 10-10-21 acute Next OV 07-07-22 [facility]

## 2022-06-15 DIAGNOSIS — M542 Cervicalgia: Secondary | ICD-10-CM | POA: Diagnosis not present

## 2022-06-15 DIAGNOSIS — Z96611 Presence of right artificial shoulder joint: Secondary | ICD-10-CM | POA: Diagnosis not present

## 2022-06-29 ENCOUNTER — Other Ambulatory Visit: Payer: Self-pay | Admitting: Internal Medicine

## 2022-06-29 NOTE — Telephone Encounter (Signed)
Last filled 05-08-22 #90 Last OV 10-10-21 acute Next OV 07-07-22 [facility]

## 2022-07-06 ENCOUNTER — Other Ambulatory Visit: Payer: Self-pay | Admitting: Internal Medicine

## 2022-07-07 ENCOUNTER — Encounter: Payer: Medicare HMO | Admitting: Internal Medicine

## 2022-07-17 ENCOUNTER — Encounter: Payer: Medicare HMO | Admitting: Internal Medicine

## 2022-07-30 DIAGNOSIS — G959 Disease of spinal cord, unspecified: Secondary | ICD-10-CM | POA: Diagnosis not present

## 2022-07-30 DIAGNOSIS — R531 Weakness: Secondary | ICD-10-CM | POA: Diagnosis not present

## 2022-07-30 DIAGNOSIS — R569 Unspecified convulsions: Secondary | ICD-10-CM | POA: Diagnosis not present

## 2022-07-30 DIAGNOSIS — M47812 Spondylosis without myelopathy or radiculopathy, cervical region: Secondary | ICD-10-CM | POA: Diagnosis not present

## 2022-07-30 DIAGNOSIS — M542 Cervicalgia: Secondary | ICD-10-CM | POA: Diagnosis not present

## 2022-07-30 DIAGNOSIS — Z981 Arthrodesis status: Secondary | ICD-10-CM | POA: Diagnosis not present

## 2022-07-30 DIAGNOSIS — F17210 Nicotine dependence, cigarettes, uncomplicated: Secondary | ICD-10-CM | POA: Diagnosis not present

## 2022-07-30 DIAGNOSIS — I10 Essential (primary) hypertension: Secondary | ICD-10-CM | POA: Diagnosis not present

## 2022-07-30 DIAGNOSIS — M4312 Spondylolisthesis, cervical region: Secondary | ICD-10-CM | POA: Diagnosis not present

## 2022-07-30 DIAGNOSIS — J449 Chronic obstructive pulmonary disease, unspecified: Secondary | ICD-10-CM | POA: Diagnosis not present

## 2022-07-30 DIAGNOSIS — F419 Anxiety disorder, unspecified: Secondary | ICD-10-CM | POA: Diagnosis not present

## 2022-08-05 ENCOUNTER — Other Ambulatory Visit: Payer: Self-pay | Admitting: Internal Medicine

## 2022-08-05 NOTE — Telephone Encounter (Signed)
Last filled 06-29-22 #90 Last OV 10-10-21 acute No Show 07-17-22 No Future OV [facility]

## 2022-08-06 NOTE — Telephone Encounter (Signed)
Please have her reschedule a Medicare wellness visit in the next month (before I can refill the xanax)

## 2022-08-10 ENCOUNTER — Telehealth: Payer: Self-pay

## 2022-08-10 NOTE — Chronic Care Management (AMB) (Signed)
Chronic Care Management Pharmacy Assistant   Name: ESTIE SPROULE  MRN: [id number] DOB: May 05, 1961  Reason for Encounter: General Adherence   Recent office visits:  None since last CCM contact  Recent consult visits:  08/18/22-Archana Rao,MD(onc)-Telemedicine-f/u iron deficiency anemia, future labs ordered,new onset neck pain referral for [facility]. 07/30/22-Stephen Scott,([facility] neurosurg)-Cervicalgia- no data 06/15/22-Robert Creighton,MD([facility] ortho)-post op right revision shoulder arthroplasty.Referral to spine center for neck pain.Refill Oxycodone '5mg'$  take 1 tablet every 8 hours.  Hospital visits:  None in previous 6 months  Medications: Outpatient Encounter Medications as of 08/10/2022  Medication Sig   albuterol (VENTOLIN HFA) 108 (90 Base) MCG/ACT inhaler INHALE 2 PUFFS BY MOUTH EVERY 6 HOURS ASNEEDED WHEEEZING/ SHORTNESS OF BREATH   ALPRAZolam (XANAX) 1 MG tablet TAKE 1/2 TO 1 TABLET BY MOUTH 3 TIMES DAILY AS NEEDED   cyanocobalamin (,VITAMIN B-12,) 1000 MCG/ML injection Inject 1 mL (1,000 mcg total) into the muscle every 30 (thirty) days for 12 doses.   fluticasone (FLONASE) 50 MCG/ACT nasal spray Place 2 sprays into both nostrils daily.   lamoTRIgine (LAMICTAL) 100 MG tablet TAKE 1 TABLET BY MOUTH TWICE DAILY   losartan-hydrochlorothiazide (HYZAAR) 100-12.5 MG tablet TAKE 1 TABLET BY MOUTH ONCE DAILY   methocarbamol (ROBAXIN) 500 MG tablet Take 1,000 mg by mouth 3 (three) times daily as needed for muscle spasms. For 7 days (21 tablets prescribed at ED 07/12/21)   ondansetron (ZOFRAN-ODT) 4 MG disintegrating tablet DISSOLVE 1 TABLET ON THE TONGUE EVERY 8 HOURS AS NEEDED FOR NAUSEA OR VOMIT   pantoprazole (PROTONIX) 40 MG tablet Take 1 tablet (40 mg total) by mouth 2 (two) times daily before a meal.   PARoxetine (PAXIL) 20 MG tablet TAKE 1 TABLET BY MOUTH ONCE DAILY   Syringe/Needle, Disp, (SYRINGE 3CC/25GX1") 25G X 1" 3 ML MISC 3 mLs by Does not apply route every 30 (thirty) days.    Tiotropium Bromide Monohydrate (SPIRIVA RESPIMAT) 2.5 MCG/ACT AERS Inhale 2 puffs into the lungs daily. (Patient not taking: Reported on 04/10/2022)   No facility-administered encounter medications on file as of 08/10/2022.     Contacted JAELYNE DEEG on 08/25/22 for general disease state and medication adherence call.   Patient is not more than 5 days past due for refill on the following medications per chart history:  [facility] Medications: Medication Name/mg Last Fill Days Supply Losartan/HCTZ 100-12.'5mg'$   06/04/22 90   What concerns do you have about your medications?  No concerns at this time   The patient denies side effects with their medications.   How often do you forget or accidentally miss a dose? Never  Do you use a pillbox? No Patient takes medications from the bottles  Are you having any problems getting your medications from your pharmacy? No  The patein reports she just received 90ds of Spiriva from manufacturer  Has the cost of your medications been a concern? Yes If yes, what medication and is patient assistance available or has it been applied for? Spiriva , patient approved and received 90ds   Since last visit with CPP, the following interventions have been made. Approved for Spiriva through patient assistance program  The patient has not had an ED visit since last contact.   The patient reports the following and denies problems with their health. Patient reports she has been evaluated and will be having neck surgery in the future.  Patient denies concerns or questions for [name], PharmD at this time.   Counseled patient on:  [place] job  taking medications, Importance of taking medication daily without missed doses, and Access to CCM team for any cost, medication or pharmacy concerns.   Care Gaps: Annual wellness visit in last year? Yes Most Recent BP reading:138/86   Summary of recommendations from last CCM Pharmacy visit (Date:04/09/22) Summary:  CCM F/U visit - Patient unable to afford Spiriva. She did notice some improvement with Spiriva but stopped taking due to cost. PAP paperwork in process. -She also used to be on statin (unclear why discontinued 2017) and lipid panel has not been updated since 2015 (LDL was 82 while on a statin); LDL peak was 155 in 2010.   Recommendations/Changes made from today's visit: -Pursue PAP for Spiriva -Recommend lipid panel at next office visit    Upcoming appointments: CCM appointment on 10/15/22     labs 08/17/22  [name], CPP notified  Avel Sensor, [place]  [phone]

## 2022-08-17 ENCOUNTER — Inpatient Hospital Stay: Payer: Medicare HMO | Attending: Oncology

## 2022-08-18 ENCOUNTER — Encounter: Payer: Self-pay | Admitting: Oncology

## 2022-08-18 ENCOUNTER — Inpatient Hospital Stay (HOSPITAL_BASED_OUTPATIENT_CLINIC_OR_DEPARTMENT_OTHER): Payer: Medicare HMO | Admitting: Oncology

## 2022-08-18 DIAGNOSIS — D500 Iron deficiency anemia secondary to blood loss (chronic): Secondary | ICD-10-CM

## 2022-08-18 NOTE — Progress Notes (Signed)
I connected with Molly Peters on 08/18/22 at  3:00 PM EDT by video enabled telemedicine visit and verified that I am speaking with the correct person using two identifiers.   I discussed the limitations, risks, security and privacy concerns of performing an evaluation and management service by telemedicine and the availability of in-person appointments. I also discussed with the patient that there may be a patient responsible charge related to this service. The patient expressed understanding and agreed to proceed.  Other persons participating in the visit and their role in the encounter:  none  Patient's location:  home Provider's location:  work  Risk analyst Complaint: Routine follow-up of iron and B12 deficiency anemia  History of present illness: Patient is a 61 year old female who is transferring her care from Dr. Mike Gip.  She has history of iron and B12 deficiency anemia.  She has received Venofer in the past and is on monthly B12 injections.EGD on 06/14/2018 revealed esophageal mucosal changes c/w short segment of Barrett's and a non-bleeding gastric ulcers.  Pathology at the GE junction revealed squamocolumnar mucosa with moderate chronic inflammation.  Gastric biopsy revealed no H pylori, dysplasia, or malignancy.  EGD on 05/04/2019 revealed non-bleeding gastric ulcer with a clean ulcer base (Forrest Class III) and friable gastric mucosa.  Biopsies revealed antral mild reactive gastropathy with no H pylori, metaplasia, dysplasia or malignancy.   Colonoscopy on 06/15/2018 revealed a 5 mm polyp in the sigmoid colon (tubular adenoma).  Colonoscopy on 05/04/2019 revealed three 4 - 5 mm polyps in the sigmoid colon (hyperplastic polyps) and diverticulosis in the sigmoid colon.     Patient is also getting low-dose screening CT chest for lung nodules.    Interval history patient is doing well and denies any specific complaints at this time.  Denies any blood loss in her stool or urine   Review  of Systems  Constitutional:  Negative for chills, fever, malaise/fatigue and weight loss.  HENT:  Negative for congestion, ear discharge and nosebleeds.   Eyes:  Negative for blurred vision.  Respiratory:  Negative for cough, hemoptysis, sputum production, shortness of breath and wheezing.   Cardiovascular:  Negative for chest pain, palpitations, orthopnea and claudication.  Gastrointestinal:  Negative for abdominal pain, blood in stool, constipation, diarrhea, heartburn, melena, nausea and vomiting.  Genitourinary:  Negative for dysuria, flank pain, frequency, hematuria and urgency.  Musculoskeletal:  Negative for back pain, joint pain and myalgias.  Skin:  Negative for rash.  Neurological:  Negative for dizziness, tingling, focal weakness, seizures, weakness and headaches.  Endo/Heme/Allergies:  Does not bruise/bleed easily.  Psychiatric/Behavioral:  Negative for depression and suicidal ideas. The patient does not have insomnia.     Allergies  Allergen Reactions   Carbamazepine Hives   Divalproex Sodium Swelling and Other (See Comments)    HAIR FALLS OUT   Clonazepam Other (See Comments)    HALLUCINATIONS   Diphenhydramine Hcl Rash   Tiotropium Bromide Monohydrate Other (See Comments)    CREATES YEAST IN THROAT   Vicodin [Hydrocodone-Acetaminophen] Itching    Past Medical History:  Diagnosis Date   Anxiety    Cervical cancer ([place]) 1980's   Chronic back pain 11/12/2009   Qualifier: Diagnosis of  By: Maxie Better FNP, Rosalita Levan    COPD (chronic obstructive pulmonary disease) ([place])    states SOB with ADLs; no O2 use; able to speak in complete sentences without SOB(11/15/2014)   Depression    Difficulty swallowing pills    s/p cervical fusion   Family history  of adverse reaction to anesthesia    pt's mother and sister have hx. of post-op N/V   Fibromyalgia    GERD (gastroesophageal reflux disease)    Heart murmur    History of gastric ulcer    Hypertension    states under  control with med., has been on med. x 1-2 yr.   IBS (irritable bowel syndrome)    Internal hemorrhoid    states has had intermittent bright red bleeding with BM (11/15/2014)   Intraductal papilloma of breast, right 08/08/2018   Limited joint range of motion    neck - s/p cervical fusion   Localized primary osteoarthritis of right shoulder region 11/23/2014   Osteoarthritis of left shoulder 07/05/2015   Osteoarthritis of right shoulder 11/2014   Pneumonia    Seizures ([place])    last seizure 2010   Short-term memory loss    TMJ syndrome    Valvular heart disease    Initial workup a number of years ago at [facility].  Echo (10/2009) showed EF 60-65%,      normal LV size, moderate aortic insufficiency with a trileaflet aortic valve and normal aortic root size.  Mild      mitral regurgitation.    Wears dentures    lower    Past Surgical History:  Procedure Laterality Date   ABDOMINAL HYSTERECTOMY     partial   ANTERIOR CERVICAL DECOMP/DISCECTOMY FUSION  02/06/2011   C4-5, C5-6, C6-7   BREAST BIOPSY Bilateral 10+ yrs ago   neg   BREAST BIOPSY Right 08/03/2018   2 areas bx, -neg   BREAST LUMPECTOMY Bilateral 1989   x 3 - benign   BREAST LUMPECTOMY Right 08/24/2018   Procedure: BREAST LUMPECTOMY WITH ULTRASOUND IN OR;  Surgeon: Vickie Epley, MD;  Location: [facility]S;  Service: General;  Laterality: Right;   [place]     x 2   COLONOSCOPY  09/28/2008   COLONOSCOPY N/A 06/15/2018   Procedure: COLONOSCOPY;  Surgeon: Virgel Manifold, MD;  Location: [facility] ENDOSCOPY;  Service: Endoscopy;  Laterality: N/A;   COLONOSCOPY WITH PROPOFOL N/A 05/04/2019   Procedure: COLONOSCOPY WITH PROPOFOL;  Surgeon: Virgel Manifold, MD;  Location: [facility] ENDOSCOPY;  Service: Endoscopy;  Laterality: N/A;   COLONOSCOPY WITH PROPOFOL N/A 04/21/2021   Procedure: COLONOSCOPY WITH PROPOFOL;  Surgeon: Virgel Manifold, MD;  Location: [facility] ENDOSCOPY;  Service: Endoscopy;   Laterality: N/A;   ESOPHAGOGASTRODUODENOSCOPY  09/28/2008   ESOPHAGOGASTRODUODENOSCOPY N/A 06/14/2018   Procedure: ESOPHAGOGASTRODUODENOSCOPY (EGD);  Surgeon: Virgel Manifold, MD;  Location[facility] END[facility]COPY;  Service: Endoscopy;  Laterality: N/A;   ESOPHAGOGASTRODUODENOSCOPY (EGD) WITH PROPOFOL N/A 05/04/2019   Procedure: ESOPHAGOGASTRODUODENOSCOPY (EGD) WITH PROPOFOL;  Surgeon: Virgel Manifold, MD;  Location: [facility] ENDOSCOPY;  Service: Endoscopy;  Laterality: N/A;   ESOPHAGOGASTRODUODENOSCOPY (EGD) WITH PROPOFOL N/A 03/18/2020   Procedure: ESOPHAGOGASTRODUODENOSCOPY (EGD) WITH PROPOFOL;  Surgeon: Lin Landsman, MD;  Location: [facility] ENDOSCOPY;  Service: Gastroenterology;  Laterality: N/A;   ESOPHAGOGASTRODUODENOSCOPY (EGD) WITH PROPOFOL N/A 04/21/2021   Procedure: ESOPHAGOGASTRODUODENOSCOPY (EGD) WITH PROPOFOL;  Surgeon: Virgel Manifold, MD;  Location: [facility] ENDOSCOPY;  Service: Endoscopy;  Laterality: N/A;   HARDWARE REMOVAL  11/17/2006   L5-S1   LAMINECTOMY WITH POSTERIOR LATERAL ARTHRODESIS LEVEL 3  12/06/2009   with synovial cyst resection L3-4 bilat.   LAMINECTOMY WITH POSTERIOR LATERAL ARTHRODESIS LEVEL 3  11/17/2006   L4-5   NM MYOVIEW LTD     Lexiscan myoview (10/2009): EF 77%,  normal wall motion, normal perfusion.    SHOULDER ARTHROSCOPY WITH DEBRIDEMENT AND BICEP TENDON REPAIR Right 04/13/2014   Procedure: RIGHT SHOULDER ARTHROSCOPY WITH DEBRIDEMENT EXTENSIVE;  Surgeon: Johnny Bridge, MD;  Location: [place];  Service: Orthopedics;  Laterality: Right;   TOTAL SHOULDER ARTHROPLASTY Right 11/23/2014   Procedure: TOTAL RIGHT SHOULDER ARTHROPLASTY;  Surgeon: Johnny Bridge, MD;  Location: [place];  Service: Orthopedics;  Laterality: Right;   TOTAL SHOULDER ARTHROPLASTY Left 07/05/2015   Procedure: LEFT TOTAL SHOULDER REPLACEMENT;  Surgeon: Marchia Bond, MD;  Location: [place];  Service: Orthopedics;  Laterality: Left;     Social History   Socioeconomic History   Marital status: Married    Spouse name: Not on file   Number of children: 3   Years of education: Not on file   Highest education level: Not on file  Occupational History   Occupation: Disabled 2003  Tobacco Use   Smoking status: Every Day    Packs/day: 1.00    Years: 42.75    Total pack years: 42.75    Types: Cigarettes   Smokeless tobacco: Never  Vaping Use   Vaping Use: Never used  Substance and Sexual Activity   Alcohol use: Yes    Alcohol/week: 0.0 standard drinks of alcohol    Comment: rare   Drug use: Yes    Types: Marijuana    Comment: marijuana 2 days ago   Sexual activity: Not on file  Other Topics Concern   Not on file  Social History Narrative   No living will   Husband, then oldest son Roosvelt Harps, should make decisions   Requests DNR--done 06/25/20   No tube feeds if cognitively unaware   Social Determinants of Health   Financial Resource Strain: Medium Risk (04/10/2022)   Overall Financial Resource Strain (CARDIA)    Difficulty of Paying Living Expenses: Somewhat hard  Food Insecurity: No Food Insecurity (04/10/2022)   Hunger Vital Sign    Worried About Running Out of Food in the Last Year: Never true    Ran Out of Food in the Last Year: Never true  Transportation Needs: Not on file  Physical Activity: Not on file  Stress: Not on file  Social Connections: Not on file  Intimate Partner Violence: Not on file    Family History  Problem Relation Age of Onset   Cervical cancer Mother    Anesthesia problems Mother        post-op N/V   Rheum arthritis Mother    Anxiety disorder Mother    Cancer Father        colon cancer   Migraines Sister    Cervical cancer Sister    Anesthesia problems Sister        post-op N/V   Migraines Brother    Asthma Daughter    Cerebral palsy Daughter        age 52   Coronary artery disease Maternal Grandmother    Hypertension Maternal Grandmother    Diabetes Maternal  Grandmother    Coronary artery disease Paternal Grandmother    Hypertension Paternal Grandmother    Diabetes Paternal Grandmother    Breast cancer Maternal Aunt    Breast cancer Maternal Aunt    Breast cancer Maternal Aunt    Colon cancer Paternal Uncle    Lung cancer Paternal Uncle      Current Outpatient Medications:    albuterol (VENTOLIN HFA) 108 (90 Base) MCG/ACT inhaler, INHALE 2 PUFFS  BY MOUTH EVERY 6 HOURS ASNEEDED WHEEEZING/ SHORTNESS OF BREATH, Disp: 8.5 g, Rfl: 1   ALPRAZolam (XANAX) 1 MG tablet, TAKE 1/2 TO 1 TABLET BY MOUTH 3 TIMES DAILY AS NEEDED, Disp: 90 tablet, Rfl: 0   cyanocobalamin (,VITAMIN B-12,) 1000 MCG/ML injection, Inject 1 mL (1,000 mcg total) into the muscle every 30 (thirty) days for 12 doses., Disp: 12 mL, Rfl: 0   fluticasone (FLONASE) 50 MCG/ACT nasal spray, Place 2 sprays into both nostrils daily., Disp: , Rfl:    lamoTRIgine (LAMICTAL) 100 MG tablet, TAKE 1 TABLET BY MOUTH TWICE DAILY, Disp: 180 tablet, Rfl: 1   losartan-hydrochlorothiazide (HYZAAR) 100-12.5 MG tablet, TAKE 1 TABLET BY MOUTH ONCE DAILY, Disp: 90 tablet, Rfl: 1   ondansetron (ZOFRAN-ODT) 4 MG disintegrating tablet, DISSOLVE 1 TABLET ON THE TONGUE EVERY 8 HOURS AS NEEDED FOR NAUSEA OR VOMIT, Disp: 30 tablet, Rfl: 0   pantoprazole (PROTONIX) 40 MG tablet, Take 1 tablet (40 mg total) by mouth 2 (two) times daily before a meal., Disp: 180 tablet, Rfl: 0   PARoxetine (PAXIL) 20 MG tablet, TAKE 1 TABLET BY MOUTH ONCE DAILY, Disp: 90 tablet, Rfl: 0   Syringe/Needle, Disp, (SYRINGE 3CC/25GX1") 25G X 1" 3 ML MISC, 3 mLs by Does not apply route every 30 (thirty) days., Disp: 12 each, Rfl: 0   Tiotropium Bromide Monohydrate (SPIRIVA RESPIMAT) 2.5 MCG/ACT AERS, Inhale 2 puffs into the lungs daily., Disp: , Rfl:    methocarbamol (ROBAXIN) 500 MG tablet, Take 1,000 mg by mouth 3 (three) times daily as needed for muscle spasms. For 7 days (21 tablets prescribed at ED 07/12/21) (Patient not taking: Reported on  08/18/2022), Disp: , Rfl:   No results found.  No images are attached to the encounter.      Latest Ref Rng & Units 08/06/2021    1:03 PM  CMP  Glucose 70 - 99 mg/dL 98   BUN 8 - 23 mg/dL 13   Creatinine 0.44 - 1.00 mg/dL 0.78   Sodium 135 - 145 mmol/L 137   Potassium 3.5 - 5.1 mmol/L 3.4   Chloride 98 - 111 mmol/L 99   CO2 22 - 32 mmol/L 26   Calcium 8.9 - 10.3 mg/dL 9.4   Total Protein 6.5 - 8.1 g/dL 7.9   Total Bilirubin 0.3 - 1.2 mg/dL 0.7   Alkaline Phos 38 - 126 U/L 87   AST 15 - 41 U/L 23   ALT 0 - 44 U/L 10       Latest Ref Rng & Units 02/10/2022    2:52 PM  CBC  WBC 4.0 - 10.5 K/uL 9.4   Hemoglobin 12.0 - 15.0 g/dL 15.3   Hematocrit 36.0 - 46.0 % 43.8   Platelets 150 - 400 K/uL 365      Observation/objective: Appears in no acute distress over video visit today.  Breathing is nonlabored  Assessment and plan: Patient is a 61 year old female and this is a routine follow-up visit for iron deficiency anemia  Patient is hemoglobin has remained stable between 14-15 for the last1 year.  She has not required any IV iron in it she is currently on B12 injections monthly.  We do not have any recent labs from her other than what she had in April 2023 when her iron studies showed a mildly low ferritin of 22 with normal B12 levels.  I have asked her to come and get her blood work checked sometime this week although I do not anticipate that she  will require any IV iron especially given her stable hemoglobin in the past.  If her lab work looks good she does not require any follow-up with me in the future  Follow-up instructions: No follow-up needed  I discussed the assessment and treatment plan with the patient. The patient was provided an opportunity to ask questions and all were answered. The patient agreed with the plan and demonstrated an understanding of the instructions.   The patient was advised to call back or seek an in-person evaluation if the symptoms worsen or if the  condition fails to improve as anticipated.  I provided 12 minutes of face-to-face video visit time during this encounter, and > 50% was spent counseling as documented under my assessment & plan.  Visit Diagnosis: 1. Iron deficiency anemia due to chronic blood loss     Dr. Randa Evens, MD, MPH [facility] at [facility] Tel- [phone] 08/18/2022 3:44 PM

## 2022-08-18 NOTE — Progress Notes (Signed)
New onset of neck pain that radiates to her skull - had an x-ray at [facility] - scheduled for an MRI

## 2022-08-19 ENCOUNTER — Telehealth: Payer: Self-pay | Admitting: *Deleted

## 2022-08-19 NOTE — Telephone Encounter (Signed)
Called the pt. Yesterday and I forgot to document. She said she could come any day this week in later part of the day . She was good wit Thursday at 3:30

## 2022-08-20 ENCOUNTER — Inpatient Hospital Stay: Payer: Medicare HMO

## 2022-09-18 ENCOUNTER — Other Ambulatory Visit: Payer: Self-pay | Admitting: Internal Medicine

## 2022-09-18 NOTE — Telephone Encounter (Signed)
Have her set up a wellness visit within the next 3 months

## 2022-09-18 NOTE — Telephone Encounter (Signed)
Alprazolam Last filled 08-06-22 #90 Last OV 10-10-21 acute No Show 07-17-22 No Future OV [facility]

## 2022-09-21 ENCOUNTER — Encounter: Payer: Self-pay | Admitting: Internal Medicine

## 2022-09-21 ENCOUNTER — Ambulatory Visit (INDEPENDENT_AMBULATORY_CARE_PROVIDER_SITE_OTHER): Payer: Medicare HMO | Admitting: Internal Medicine

## 2022-09-21 VITALS — BP 130/88 | HR 72 | Temp 97.8°F | Ht 59.25 in | Wt 93.4 lb

## 2022-09-21 DIAGNOSIS — Z23 Encounter for immunization: Secondary | ICD-10-CM

## 2022-09-21 DIAGNOSIS — E441 Mild protein-calorie malnutrition: Secondary | ICD-10-CM

## 2022-09-21 DIAGNOSIS — F411 Generalized anxiety disorder: Secondary | ICD-10-CM | POA: Diagnosis not present

## 2022-09-21 DIAGNOSIS — I10 Essential (primary) hypertension: Secondary | ICD-10-CM

## 2022-09-21 DIAGNOSIS — I38 Endocarditis, valve unspecified: Secondary | ICD-10-CM | POA: Diagnosis not present

## 2022-09-21 DIAGNOSIS — J4489 Other specified chronic obstructive pulmonary disease: Secondary | ICD-10-CM

## 2022-09-21 DIAGNOSIS — J441 Chronic obstructive pulmonary disease with (acute) exacerbation: Secondary | ICD-10-CM

## 2022-09-21 DIAGNOSIS — I359 Nonrheumatic aortic valve disorder, unspecified: Secondary | ICD-10-CM | POA: Diagnosis not present

## 2022-09-21 DIAGNOSIS — F39 Unspecified mood [affective] disorder: Secondary | ICD-10-CM | POA: Diagnosis not present

## 2022-09-21 DIAGNOSIS — F17210 Nicotine dependence, cigarettes, uncomplicated: Secondary | ICD-10-CM

## 2022-09-21 DIAGNOSIS — E46 Unspecified protein-calorie malnutrition: Secondary | ICD-10-CM | POA: Diagnosis not present

## 2022-09-21 DIAGNOSIS — Z0000 Encounter for general adult medical examination without abnormal findings: Secondary | ICD-10-CM | POA: Diagnosis not present

## 2022-09-21 LAB — CBC
HCT: 39.8 % (ref 36.0–46.0)
Hemoglobin: 13.5 g/dL (ref 12.0–15.0)
MCHC: 34 g/dL (ref 30.0–36.0)
MCV: 93.4 fl (ref 78.0–100.0)
Platelets: 333 10*3/uL (ref 150.0–400.0)
RBC: 4.27 Mil/uL (ref 3.87–5.11)
RDW: 14.2 % (ref 11.5–15.5)
WBC: 6.5 10*3/uL (ref 4.0–10.5)

## 2022-09-21 LAB — HEPATIC FUNCTION PANEL
ALT: 7 U/L (ref 0–35)
AST: 18 U/L (ref 0–37)
Albumin: 4.3 g/dL (ref 3.5–5.2)
Alkaline Phosphatase: 72 U/L (ref 39–117)
Bilirubin, Direct: 0.1 mg/dL (ref 0.0–0.3)
Total Bilirubin: 0.4 mg/dL (ref 0.2–1.2)
Total Protein: 7 g/dL (ref 6.0–8.3)

## 2022-09-21 LAB — LIPID PANEL
Cholesterol: 161 mg/dL (ref 0–200)
HDL: 57.9 mg/dL (ref >39.00)
LDL Cholesterol: 86 mg/dL (ref 0–99)
NonHDL: 102.77
Total CHOL/HDL Ratio: 3
Triglycerides: 86 mg/dL (ref 0.0–149.0)
VLDL: 17.2 mg/dL (ref 0.0–40.0)

## 2022-09-21 LAB — RENAL FUNCTION PANEL
Albumin: 4.3 g/dL (ref 3.5–5.2)
BUN: 9 mg/dL (ref 6–23)
CO2: 32 mEq/L (ref 19–32)
Calcium: 9.8 mg/dL (ref 8.4–10.5)
Chloride: 99 mEq/L (ref 96–112)
Creatinine, Ser: 0.85 mg/dL (ref 0.40–1.20)
GFR: 73.23 mL/min (ref >60.00)
Glucose, Bld: 84 mg/dL (ref 70–99)
Phosphorus: 3.4 mg/dL (ref 2.3–4.6)
Potassium: 3.5 mEq/L (ref 3.5–5.1)
Sodium: 138 mEq/L (ref 135–145)

## 2022-09-21 LAB — VITAMIN B12: Vitamin B-12: 923 pg/mL — ABNORMAL HIGH (ref 211–911)

## 2022-09-21 LAB — TSH: TSH: 1.18 u[IU]/mL (ref 0.35–5.50)

## 2022-09-21 NOTE — Patient Instructions (Addendum)
Please get the screening mammogram and your overdue chest CT (for cancer screening). You can get your tetanus booster and shingles vaccines at the pharmacy.

## 2022-09-21 NOTE — Addendum Note (Signed)
Addended by: Pilar Grammes on: 09/21/2022 12:20 PM   Modules accepted: Orders

## 2022-09-21 NOTE — Assessment & Plan Note (Signed)
BP Readings from Last 3 Encounters:  09/21/22 130/88  10/10/21 138/86  08/06/21 (!) 158/104   Good control on losartan/HCTZ 100/25

## 2022-09-21 NOTE — Assessment & Plan Note (Signed)
I have personally reviewed the Medicare Annual Wellness questionnaire and have noted 1. The patient's medical and social history 2. Their use of alcohol, tobacco or illicit drugs 3. Their current medications and supplements 4. The patient's functional ability including ADL's, fall risks, home safety risks and hearing or visual             impairment. 5. Diet and physical activities 6. Evidence for depression or mood disorders  The patients weight, height, BMI and visual acuity have been recorded in the chart I have made referrals, counseling and provided education to the patient based review of the above and I have provided the pt with a written personalized care plan for preventive services.  I have provided you with a copy of your personalized plan for preventive services. Please take the time to review along with your updated medication list.  Colon due 2027 She needs to set up screening mammogram No pap due to hyster Discussed exercise Flu vaccine today Td/shingrix at pharmacy Prefers no COVID vaccine

## 2022-09-21 NOTE — Assessment & Plan Note (Signed)
Aortic murmur much more prominent--including diastole Will check echo

## 2022-09-21 NOTE — Progress Notes (Signed)
Subjective:    Patient ID: Molly Peters, female    DOB: Mar 18, 1961, 61 y.o.   MRN: [id number]  HPI Here for Medicare wellness visit and follow up of chronic health conditions Reviewed advanced directives Reviewed other doctors---Dr Mim--neck surgeon, Dr Karen Chafe (shoulder), Dr Darius Bump (ortho also--[facility]), Dr Rao--hematology, Dr Abner Greenspan, Dr Magdalene Molly Had reverse right shoulder replacement a few months ago. No other surgery or hospitalizations Rare alcohol Ongoing cigarettes No falls No depression or anhedonia. Ongoing anxiety--does well with the xanax bid Vision is fair---needs new glasses Hearing is good Independent with instrumental ADLs---does have some days she can't Mild memory issues--nothing new  Has MRI of neck pending--then going back to surgeon Has pain and "crawling" sensation  Eating about the same Had gotten up to 100#--but lost again Is trying to cut back on the smoking  Breathing is "fair" Regular cough--will wake her up Does some walking outside---does have DOE  Uses the albuterol when coughing/SOB come on  No seizures on lamictal  Current Outpatient Medications on File Prior to Visit  Medication Sig Dispense Refill   albuterol (VENTOLIN HFA) 108 (90 Base) MCG/ACT inhaler INHALE 2 PUFFS BY MOUTH EVERY 6 HOURS ASNEEDED WHEEEZING/ SHORTNESS OF BREATH 8.5 g 1   ALPRAZolam (XANAX) 1 MG tablet TAKE 1/2 TO 1 TABLET BY MOUTH 3 TIMES DAILY AS NEEDED 90 tablet 0   cyanocobalamin (,VITAMIN B-12,) 1000 MCG/ML injection Inject 1 mL (1,000 mcg total) into the muscle every 30 (thirty) days for 12 doses. 12 mL 0   fluticasone (FLONASE) 50 MCG/ACT nasal spray Place 2 sprays into both nostrils daily.     lamoTRIgine (LAMICTAL) 100 MG tablet TAKE 1 TABLET BY MOUTH TWICE DAILY 180 tablet 0   losartan-hydrochlorothiazide (HYZAAR) 100-12.5 MG tablet TAKE 1 TABLET BY MOUTH ONCE DAILY 90 tablet 0   methocarbamol (ROBAXIN) 500 MG tablet Take 1,000 mg by  mouth 3 (three) times daily as needed for muscle spasms. For 7 days (21 tablets prescribed at ED 07/12/21)     ondansetron (ZOFRAN-ODT) 4 MG disintegrating tablet DISSOLVE 1 TABLET ON THE TONGUE EVERY 8 HOURS AS NEEDED FOR NAUSEA OR VOMIT 30 tablet 0   pantoprazole (PROTONIX) 40 MG tablet Take 1 tablet (40 mg total) by mouth 2 (two) times daily before a meal. 180 tablet 0   PARoxetine (PAXIL) 20 MG tablet TAKE 1 TABLET BY MOUTH ONCE DAILY 90 tablet 0   Syringe/Needle, Disp, (SYRINGE 3CC/25GX1") 25G X 1" 3 ML MISC 3 mLs by Does not apply route every 30 (thirty) days. 12 each 0   Tiotropium Bromide Monohydrate (SPIRIVA RESPIMAT) 2.5 MCG/ACT AERS Inhale 2 puffs into the lungs daily.     No current facility-administered medications on file prior to visit.    Allergies  Allergen Reactions   Carbamazepine Hives   Divalproex Sodium Swelling and Other (See Comments)    HAIR FALLS OUT   Clonazepam Other (See Comments)    HALLUCINATIONS   Diphenhydramine Hcl Rash   Tiotropium Bromide Monohydrate Other (See Comments)    CREATES YEAST IN THROAT   Vicodin [Hydrocodone-Acetaminophen] Itching    Past Medical History:  Diagnosis Date   Anxiety    Cervical cancer ([place]) 1980's   Chronic back pain 11/12/2009   Qualifier: Diagnosis of  By: Maxie Better FNP, Rosalita Levan    COPD (chronic obstructive pulmonary disease) ([place])    states SOB with ADLs; no O2 use; able to speak in complete sentences without SOB(11/15/2014)   Depression    Difficulty  swallowing pills    s/p cervical fusion   Family history of adverse reaction to anesthesia    pt's mother and sister have hx. of post-op N/V   Fibromyalgia    GERD (gastroesophageal reflux disease)    Heart murmur    History of gastric ulcer    Hypertension    states under control with med., has been on med. x 1-2 yr.   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  Echo (10/2009) showed EF 60-65%,      normal LV size, moderate aortic insufficiency with a trileaflet aortic valve and normal aortic root size.  Mild      mitral regurgitation.    Wears dentures    lower    Past Surgical History:  Procedure Laterality Date   ABDOMINAL HYSTERECTOMY     partial   ANTERIOR CERVICAL DECOMP/DISCECTOMY FUSION  02/06/2011   C4-5, C5-6, C6-7   BREAST BIOPSY Bilateral 10+ yrs ago   neg   BREAST BIOPSY Right 08/03/2018   2 areas bx, -neg   BREAST LUMPECTOMY Bilateral 1989   x 3 - benign   BREAST LUMPECTOMY Right 08/24/2018   Procedure: BREAST LUMPECTOMY WITH ULTRASOUND IN OR;  Surgeon: Vickie Epley, MD;  Location: [facility] ORS;  Service: General;  Laterality: Right;   [place]     x 2   COLONOSCOPY  09/28/2008   COLONOSCOPY N/A 06/15/2018   Procedure: COLONOSCOPY;  Surgeon: Virgel Manifold, MD;  Location: [facility] ENDOSCOPY;  Service: Endoscopy;  Laterality: N/A;   COLONOSCOPY WITH PROPOFOL N/A 05/04/2019   Procedure: COLONOSCOPY WITH PROPOFOL;  Surgeon: Virgel Manifold, MD;  Location: [facility] ENDOSCOPY;  Service: Endoscopy;  Laterality: N/A;   COLONOSCOPY WITH PROPOFOL N/A 04/21/2021   Procedure: COLONOSCOPY WITH PROPOFOL;  Surgeon: Virgel Manifold, MD;  Location: [facility] ENDOSCOPY;  Service: Endoscopy;  Laterality: N/A;   ESOPHAGOGASTRODUODENOSCOPY  09/28/2008   ESOPHAGOGASTRODUODENOSCOPY N/A 06/14/2018   Procedure: ESOPHAGOGASTRODUODENOSCOPY (EGD);  Surgeon: Virgel Manifold, MD;  Location:  [facility];  Service: Endoscopy;  Laterality: N/A;   ESOPHAGOGASTRODUODENOSCOPY (EGD) WITH PROPOFOL N/A 05/04/2019   Procedure: ESOPHAGOGASTRODUODENOSCOPY (EGD) WITH PROPOFOL;  Surgeon: Virgel Manifold, MD;  Location: [facility] ENDOSCOPY;  Service: Endoscopy;  Laterality: N/A;   ESOPHAGOGASTRODUODENOSCOPY (EGD) WITH PROPOFOL N/A 03/18/2020   Procedure: ESOPHAGOGASTRODUODENOSCOPY (EGD) WITH PROPOFOL;  Surgeon: Lin Landsman, MD;  Location: [facility] ENDOSCOPY;  Service: Gastroenterology;  Laterality: N/A;   ESOPHAGOGASTRODUODENOSCOPY (EGD) WITH PROPOFOL N/A 04/21/2021   Procedure: ESOPHAGOGASTRODUODENOSCOPY (EGD) WITH PROPOFOL;  Surgeon: Virgel Manifold, MD;  Location: [facility] ENDOSCOPY;  Service: Endoscopy;  Laterality: N/A;   HARDWARE REMOVAL  11/17/2006   L5-S1   LAMINECTOMY WITH POSTERIOR LATERAL ARTHRODESIS LEVEL 3  12/06/2009   with synovial cyst resection L3-4 bilat.   LAMINECTOMY WITH POSTERIOR LATERAL ARTHRODESIS LEVEL 3  11/17/2006   L4-5  NM MYOVIEW LTD     Lexiscan myoview (10/2009): EF 77%, normal wall motion, normal perfusion.    SHOULDER ARTHROSCOPY WITH DEBRIDEMENT AND BICEP TENDON REPAIR Right 04/13/2014   Procedure: RIGHT SHOULDER ARTHROSCOPY WITH DEBRIDEMENT EXTENSIVE;  Surgeon: Johnny Bridge, MD;  Location: [place];  Service: Orthopedics;  Laterality: Right;   TOTAL SHOULDER ARTHROPLASTY Right 11/23/2014   Procedure: TOTAL RIGHT SHOULDER ARTHROPLASTY;  Surgeon: Johnny Bridge, MD;  Location: [place];  Service: Orthopedics;  Laterality: Right;   TOTAL SHOULDER ARTHROPLASTY Left 07/05/2015   Procedure: LEFT TOTAL SHOULDER REPLACEMENT;  Surgeon: Marchia Bond, MD;  Location: [place];  Service: Orthopedics;  Laterality: Left;    Family History  Problem Relation Age of Onset   Cervical cancer Mother    Anesthesia problems Mother        post-op N/V   Rheum arthritis Mother    Anxiety disorder Mother    Cancer Father         colon cancer   Migraines Sister    Cervical cancer Sister    Anesthesia problems Sister        post-op N/V   Migraines Brother    Asthma Daughter    Cerebral palsy Daughter        age 44   Coronary artery disease Maternal Grandmother    Hypertension Maternal Grandmother    Diabetes Maternal Grandmother    Coronary artery disease Paternal Grandmother    Hypertension Paternal Grandmother    Diabetes Paternal Grandmother    Breast cancer Maternal Aunt    Breast cancer Maternal Aunt    Breast cancer Maternal Aunt    Colon cancer Paternal Uncle    Lung cancer Paternal Uncle     Social History   Socioeconomic History   Marital status: Married    Spouse name: Not on file   Number of children: 3   Years of education: Not on file   Highest education level: Not on file  Occupational History   Occupation: Disabled 2003  Tobacco Use   Smoking status: Every Day    Packs/day: 1.00    Years: 42.75    Total pack years: 42.75    Types: Cigarettes    Passive exposure: Never   Smokeless tobacco: Never  Vaping Use   Vaping Use: Never used  Substance and Sexual Activity   Alcohol use: Yes    Alcohol/week: 0.0 standard drinks of alcohol    Comment: rare   Drug use: Yes    Types: Marijuana    Comment: marijuana 2 days ago   Sexual activity: Not on file  Other Topics Concern   Not on file  Social History Narrative   No living will   Husband, then oldest son Roosvelt Harps, should make decisions   Requests DNR--done 06/25/20   No tube feeds if cognitively unaware   Social Determinants of Health   Financial Resource Strain: Medium Risk (04/10/2022)   Overall Financial Resource Strain (CARDIA)    Difficulty of Paying Living Expenses: Somewhat hard  Food Insecurity: No Food Insecurity (04/10/2022)   Hunger Vital Sign    Worried About Running Out of Food in the Last Year: Never true    Ran Out of Food in the Last Year: Never true  Transportation Needs: Not on file  Physical Activity:  Not on file  Stress: Not on file  Social Connections: Not on file  Intimate Partner Violence: Not on file   Review  of Systems Broke bottom plate--has to get repaired No suspicious skin lesions Frequent night awakening---briefly watches TV. Some daytime somnolence Wears seat belt No sig heartburn Bowels move fine No dysuria/hematuria. No incontinence Gets off and on chest pain---seems bronchial Occasional skipped beat---no racing No dizziness or syncope    Objective:   Physical Exam Constitutional:      Appearance: Normal appearance.  HENT:     Mouth/Throat:     Comments: No lesions Eyes:     Conjunctiva/sclera: Conjunctivae normal.     Pupils: Pupils are equal, round, and reactive to light.  Cardiovascular:     Rate and Rhythm: Normal rate and regular rhythm.     Pulses: Normal pulses.     Heart sounds:     No gallop.     Comments: Loud aortic systolic murmur and prominent aortic diastolic murmur Pulmonary:     Effort: Pulmonary effort is normal.     Breath sounds: No wheezing or rales.     Comments: Decreased breath sounds but clear Abdominal:     Palpations: Abdomen is soft.     Tenderness: There is no abdominal tenderness.  Musculoskeletal:     Cervical back: Neck supple.     Right lower leg: No edema.     Left lower leg: No edema.  Lymphadenopathy:     Cervical: No cervical adenopathy.  Skin:    Findings: No lesion or rash.  Neurological:     General: No focal deficit present.     Mental Status: She is alert and oriented to person, place, and time.     Comments: Mini-cog okay---clock fine, recall 2/3  Psychiatric:        Mood and Affect: Mood normal.        Behavior: Behavior normal.            Assessment & Plan:

## 2022-09-21 NOTE — Assessment & Plan Note (Signed)
Discussed cigarettes cessation On daily spiriva Albuterol prn stable

## 2022-09-21 NOTE — Assessment & Plan Note (Signed)
Chronic anxiety Paroxetine 20 and xanax '1mg'$  bid

## 2022-09-21 NOTE — Progress Notes (Signed)
Vision Screening   Right eye Left eye Both eyes  Without correction     With correction '20/40 20/40 20/40 '$  Hearing Screening - Comments:: Passed whisper test'

## 2022-09-21 NOTE — Assessment & Plan Note (Signed)
Eats fair Discussed that she will gain weight once she stops smoking----working on it

## 2022-09-22 ENCOUNTER — Telehealth: Payer: Self-pay | Admitting: Internal Medicine

## 2022-09-22 DIAGNOSIS — M47812 Spondylosis without myelopathy or radiculopathy, cervical region: Secondary | ICD-10-CM | POA: Diagnosis not present

## 2022-09-22 DIAGNOSIS — M4804 Spinal stenosis, thoracic region: Secondary | ICD-10-CM | POA: Diagnosis not present

## 2022-09-22 DIAGNOSIS — Q7649 Other congenital malformations of spine, not associated with scoliosis: Secondary | ICD-10-CM | POA: Diagnosis not present

## 2022-09-22 DIAGNOSIS — M4802 Spinal stenosis, cervical region: Secondary | ICD-10-CM | POA: Diagnosis not present

## 2022-09-22 NOTE — Telephone Encounter (Signed)
There are no future orders or referrals that I can see.

## 2022-09-22 NOTE — Telephone Encounter (Signed)
Patient called to get information about what doctors she needs to see and where to go mammogram, CT scan, cardiologist. Call back number [phone].

## 2022-09-23 NOTE — Telephone Encounter (Signed)
Patient called back in and relayed message below. She had questions about a lung x-ray and a referral to a pulmonary office. Thank you!

## 2022-09-23 NOTE — Telephone Encounter (Signed)
Tried to call pt. VM is not set up. Per Dr Silvio Pate:  I ordered an echocardiogram. This should be done and then I will refer to a cardiologist if appropriate   She can call to schedule her own mammogram.

## 2022-09-23 NOTE — Telephone Encounter (Signed)
Spoke to pt. Apologized that we had forgotten the Lung Screening. Advised her the referral was created and they will contact her.

## 2022-09-23 NOTE — Addendum Note (Signed)
Addended by: Viviana Simpler I on: 09/23/2022 10:42 AM   Modules accepted: Orders

## 2022-09-25 DIAGNOSIS — M542 Cervicalgia: Secondary | ICD-10-CM | POA: Diagnosis not present

## 2022-09-25 DIAGNOSIS — Z981 Arthrodesis status: Secondary | ICD-10-CM | POA: Diagnosis not present

## 2022-09-29 ENCOUNTER — Encounter: Payer: Self-pay | Admitting: *Deleted

## 2022-10-12 ENCOUNTER — Other Ambulatory Visit: Payer: Self-pay | Admitting: Internal Medicine

## 2022-10-15 ENCOUNTER — Telehealth: Payer: Medicare HMO

## 2022-10-21 ENCOUNTER — Telehealth: Payer: Self-pay

## 2022-10-21 NOTE — Chronic Care Management (AMB) (Signed)
    Chronic Care Management Pharmacy Assistant   Name: Molly Peters  MRN: [id number] DOB: 08-13-1961  Reason for Encounter: Reminder Call  Medications: Outpatient Encounter Medications as of 10/21/2022  Medication Sig   albuterol (VENTOLIN HFA) 108 (90 Base) MCG/ACT inhaler INHALE 2 PUFFS BY MOUTH EVERY 6 HOURS ASNEEDED WHEEEZING/ SHORTNESS OF BREATH   ALPRAZolam (XANAX) 1 MG tablet TAKE 1/2 TO 1 TABLET BY MOUTH 3 TIMES DAILY AS NEEDED   cyanocobalamin (,VITAMIN B-12,) 1000 MCG/ML injection Inject 1 mL (1,000 mcg total) into the muscle every 30 (thirty) days for 12 doses.   fluticasone (FLONASE) 50 MCG/ACT nasal spray Place 2 sprays into both nostrils daily.   lamoTRIgine (LAMICTAL) 100 MG tablet TAKE 1 TABLET BY MOUTH TWICE DAILY   losartan-hydrochlorothiazide (HYZAAR) 100-12.5 MG tablet TAKE 1 TABLET BY MOUTH ONCE DAILY   methocarbamol (ROBAXIN) 500 MG tablet Take 1,000 mg by mouth 3 (three) times daily as needed for muscle spasms. For 7 days (21 tablets prescribed at ED 07/12/21)   ondansetron (ZOFRAN-ODT) 4 MG disintegrating tablet DISSOLVE 1 TABLET ON THE TONGUE EVERY 8 HOURS AS NEEDED FOR NAUSEA OR VOMIT   pantoprazole (PROTONIX) 40 MG tablet Take 1 tablet (40 mg total) by mouth 2 (two) times daily before a meal.   PARoxetine (PAXIL) 20 MG tablet TAKE 1 TABLET BY MOUTH ONCE DAILY   Syringe/Needle, Disp, (SYRINGE 3CC/25GX1") 25G X 1" 3 ML MISC 3 mLs by Does not apply route every 30 (thirty) days.   Tiotropium Bromide Monohydrate (SPIRIVA RESPIMAT) 2.5 MCG/ACT AERS Inhale 2 puffs into the lungs daily.   No facility-administered encounter medications on file as of 10/21/2022.  [name] was contacted to remind of upcoming telephone visit with [name] on 10/26/22 at 2:15. Patient was reminded to have any blood glucose and blood pressure readings available for review at appointment.   Patient confirmed appointment.  Are you having any problems with your  medications? No   Do you have any concerns you like to discuss with the pharmacist? No  CCM referral has been placed prior to visit?  No   Star Rating Drugs: Medication:  Last Fill: Day Supply Losartan/hctz 100-12.'5mg'$    09/18/22  [place], CPP notified  Avel Sensor, [place]  [phone]

## 2022-10-26 ENCOUNTER — Telehealth: Payer: Self-pay | Admitting: Pharmacist

## 2022-10-26 ENCOUNTER — Ambulatory Visit: Payer: Medicare HMO | Admitting: Pharmacist

## 2022-10-26 DIAGNOSIS — I10 Essential (primary) hypertension: Secondary | ICD-10-CM

## 2022-10-26 DIAGNOSIS — F17210 Nicotine dependence, cigarettes, uncomplicated: Secondary | ICD-10-CM

## 2022-10-26 DIAGNOSIS — J4489 Other specified chronic obstructive pulmonary disease: Secondary | ICD-10-CM

## 2022-10-26 DIAGNOSIS — E782 Mixed hyperlipidemia: Secondary | ICD-10-CM

## 2022-10-26 NOTE — Patient Instructions (Signed)
Visit Information  Phone number for Pharmacist: [phone]   Goals Addressed   None     Care Plan : [place]  Updates made by Charlton Haws, [facility] since 10/26/2022 12:00 AM     Problem: Hypertension, Hyperlipidemia, GERD, COPD, Anxiety, Osteoarthritis, Tobacco use, and Iron deficiency   Priority: High  Onset Date: 06/10/2021     Long-Range Goal: Disease mgmt   Start Date: 10/20/2021  Expected End Date: 04/11/2023  Recent Progress: On track  Priority: High  Note:   Current Barriers:  Cost - Spiriva  Pharmacist Clinical Goal(s):  Patient will contact provider office for questions/concerns as evidenced notation of same in electronic health record through collaboration with PharmD and provider.   Interventions: 1:1 collaboration with Venia Carbon, MD regarding development and update of comprehensive plan of care as evidenced by provider attestation and co-signature Inter-disciplinary care team collaboration (see longitudinal plan of care) Comprehensive medication review performed; medication list updated in electronic medical record  Hypertension  (BP goal <140/90) -Controlled - per recent clinic readings -Current home readings: n/a -Current treatment: Losartan-HCTZ 100- 12.5 mg - 1/2 tab daily - Appropriate, Effective, Safe, Accessible -Medications previously tried: none  -Educated on BP goals and benefits of medications for prevention of heart attack, stroke and kidney damage -Recommended to continue current medication  Hyperlipidemia: (LDL goal < 100) -Controlled - LDL 86 (09/2022) reasonable; ASCVD-risk 9.3% intermediate, low threshold to start statin in future -Pt was previously on a statin, Reason for d/c not documented. LDL was up to 155, Trig 244 in 2010. -Current treatment: None -Medications previously tried:  simvastatin 20 mg (d/c'd per pain mgmt 2017 "error") - no adverse effects documented -Continue to monitor  COPD (Goal: control  symptoms and prevent exacerbations) -Controlled - pt reports improved symptoms since using Spiriva daily -Initial pulmonology consult completed 08/23/20, Spiriva started. Pt completed PFTs but no follow up since then. PFTs considered normal. Pt has not scheduled follow up. -Gold Grade: Gold 1 (FEV1>80%) -Current COPD Classification:  B (high sx, 0-1 moderate exacerbations, no hospitalizations) -Pulmonary function testing: 09/2020; FEV1 73% predicted, ratio 0.69;  change -2%, no evidence of disease -Current treatment  Albuterol PRN - Appropriate, Effective, Safe, Accessible Spiriva Respimat 2.5 mcg/act (PAP)  - Appropriate, Effective, Safe, Accessible -Medications previously tried: Advair, Symbicort, Spiriva DPI - thrush -Exacerbations requiring treatment in last 6 months: none -Patient denies consistent use of maintenance inhaler -Counseled on Benefits of consistent maintenance inhaler use -Recommended to continue current medication; renew Spiriva PAP for 2024  Tobacco use (Goal: quit smoking) -Current smoker - 3/4 PPD -Quit history -she quit for 15 months about 20 years ago with nicotine patches/inhaler, went back to work and coworkers smoked. -Counseled on benefits of smoking cessation, offered support if she decides to quit; pt voiced understanding and will think about quitting -Lung cancer screening is due, referral has been made, pt has phone number to call and set up screening appt  Depression/Anxiety (Goal: Improve mood) -Controlled - per patient report; she takes Xanax 1 whole tablet twice daily and fill dates support this -PHQ9: 0 (06/2021) - minimal depression -Family stressors but overall stable, doing well on current therapy -Current treatment: Paroxetine 20 mg daily - Appropriate, Effective, Safe, Accessible Alprazolam 1 mg - 1/2-1 tablet TID PRN - Appropriate, Effective, Query Safe -Medications previously tried/failed: none -Reviewed long term risks associated with  benzodiazepines; she has been on alprazolam since at least 2012, tapering at this point may cause more harm  than good, will reassess periodically -Recommended to continue current medication.  Health Maintenance -Vaccine gaps: Shingrix, TDAP -Mammogram is due - pt has phone number to schedule  Patient Goals/Self-Care Activities Patient will:  - take medications as prescribed as evidenced by patient report and record review focus on medication adherence by routine collaborate with provider on medication access solutions (Spiriva)      Patient verbalizes understanding of instructions and care plan provided today and agrees to view in [place]. Active MyChart status and patient understanding of how to access instructions and care plan via MyChart confirmed with patient.    Telephone follow up appointment with pharmacy team member scheduled for: 6 months  [name], PharmD, [facility] Clinical Pharmacist [facility] [phone]

## 2022-10-26 NOTE — Telephone Encounter (Signed)
Returned pt call and completed phone visit. See CCM note.

## 2022-10-26 NOTE — Telephone Encounter (Signed)
  Chronic Care Management   Outreach Note  10/26/2022 Name: SUZANNE KHO MRN: [id number] DOB: 07/21/1961  Referred by: Venia Carbon, MD  Patient had a phone appointment scheduled with clinical pharmacist today.  An unsuccessful telephone outreach was attempted today. The patient was referred to the pharmacist for assistance with medications, care management and care coordination.   Patient will NOT be penalized in any way for missing a CCM appointment. The no-show fee does not apply.  If possible, a message was left to return call to: [phone] or to [facility].  [name], PharmD, BCACP Clinical Pharmacist [facility] [phone]

## 2022-10-26 NOTE — Telephone Encounter (Signed)
Patient called and stated she missed her appointment and wanted to know if she could be rescheduled.

## 2022-10-26 NOTE — Progress Notes (Signed)
Chronic Care Management Pharmacy Note  10/26/2022 Name:  Molly Peters MRN:  [id number] DOB:  [birth year]  Summary: CCM F/U visit -HTN: BP at goal -HLD: LDL 86 (09/2022) without statin; 10-year ASCVD risk 9.3% is intermediate risk; threshold is low to start a statin in the future -COPD: pt reports improvement in sx on Spiriva daily -Screenings: due for lung CT, mammogram. Pt has phone number to schedule these  Recommendations/Changes made from today's visit: -Renew PAP for Spiriva - 2024 -No med changes  Plan: -[place] will follow up 2 months for general update -Pharmacist follow up televisit scheduled for 6 months -PCP visit 09/23/23; ECHO 11/05/22    Subjective: Molly Peters is an 61 y.o. year old female who is a primary patient of Venia Carbon, MD.  The CCM team was consulted for assistance with disease management and care coordination needs.    Engaged with patient by telephone for follow up visit in response to provider referral for pharmacy case management and/or care coordination services.   Consent to Services:  The patient was given information about Chronic Care Management services, agreed to services, and gave verbal consent prior to initiation of services.  Please see initial visit note for detailed documentation.   Patient Care Team: Venia Carbon, MD as PCP - General Virgel Manifold, MD (Inactive) as Consulting Physician (Gastroenterology) Vickie Epley, MD (Inactive) as Consulting Physician (General Surgery) Sindy Guadeloupe, MD as Consulting Physician (Oncology) Charlton Haws, [facility] as Pharmacist (Pharmacist)  Recent office visits:  09/21/22 Dr Silvio Pate OV: annual - working on smoking cessation/wt gain. Set up mammogram and lung CT screening.  10/10/21 Dr Silvio Pate OV: acute visit for rash. Rx prednixone 40 mg x 5 days, try cetirizine or fexofenadine. If no improvement, set up allergy appt.  06/30/21 - PCP - Pt  presented for routine medical visit. COPD, She was unable to afford Spiriva. Continues albuterol. HTN, good today. Malnutrition, add Ensure for skipped meals. Seizure free on Lamictal. Mood disorder stable on Xanax, paroxetine. RTC 1 year.   Recent consult visits:  08/18/22 Dr Janese Banks VV (Heme/Onc): anemia - repeat labwork  04/08/22 Dr Jeannie Fend (ortho): s/p shoulder rep. Overall doing good. RTC 2 months. 03/09/22 Dr Jeannie Fend (Ortho): s/p shoulder replacement. Rx gabapentin and oxycodone #10 tablets (last narcotic Rx) 02/13/22 NP Ander Purpura Zenia Resides (Heme/onc): f/u IDA. Take iron QOD.  12/24/21 Dr Jeannie Fend (Ortho): s/p shoulder replacement. 12/12/21 PA Sharol Roussel (ortho): shoulder surgery. Referred to sports surgeon for evaluation. 11/11/21 PA Sharol Roussel (Ortho): shoulder surgery. Rx'd methocarbamol and naproxen. Referred to PT 08/06/2021 - Heme/onc - Patient presented for Iron Deficiency.    Hospital visits:  02/18/22 Admission for shoulder replacement revision.   Objective:  Lab Results  Component Value Date   CREATININE 0.85 09/21/2022   BUN 9 09/21/2022   GFR 73.23 09/21/2022   GFRNONAA >60 08/06/2021   GFRAA >60 08/12/2020   NA 138 09/21/2022   K 3.5 09/21/2022   CALCIUM 9.8 09/21/2022   CO2 32 09/21/2022   GLUCOSE 84 09/21/2022    Lab Results  Component Value Date/Time   GFR 73.23 09/21/2022 12:23 PM   GFR 69.90 06/30/2021 03:28 PM    Lab Results  Component Value Date   CHOL 161 09/21/2022   HDL 57.90 09/21/2022   LDLCALC 86 09/21/2022   LDLDIRECT 155.7 10/10/2009   TRIG 86.0 09/21/2022   CHOLHDL 3 09/21/2022       Latest Ref Rng & Units 09/21/2022  12:23 PM 08/06/2021    1:03 PM 06/30/2021    3:28 PM  Hepatic Function  Total Protein 6.0 - 8.3 g/dL 7.0  7.9  7.1   Albumin 3.5 - 5.2 g/dL 3.5 - 5.2 g/dL 4.3    4.3  4.2  4.2   AST 0 - 37 U/L _0 ALT 0 - 35 U/L _1 Alk Phosphatase 39 - 117 U/L 72  87  74   Total Bilirubin 0.2 - 1.2 mg/dL 0.4  0.7  0.5    Bilirubin, Direct 0.0 - 0.3 mg/dL 0.1       Lab Results  Component Value Date/Time   TSH 1.18 09/21/2022 12:23 PM   TSH 1.256 07/05/2018 12:35 PM   TSH 0.30 (L) 04/04/2014 01:02 PM   FREET4 0.77 06/30/2021 03:28 PM   FREET4 0.67 01/26/2020 01:07 PM       Latest Ref Rng & Units 09/21/2022   12:23 PM 02/10/2022    2:52 PM 08/06/2021    1:03 PM  CBC  WBC 4.0 - 10.5 K/uL 6.5  9.4  7.7   Hemoglobin 12.0 - 15.0 g/dL 13.5  15.3  14.8   Hematocrit 36.0 - 46.0 % 39.8  43.8  41.3   Platelets 150.0 - 400.0 K/uL 333.0  365  355     Lab Results  Component Value Date/Time   VD25OH 10.5 (L) 02/18/2016 02:16 PM   VD25OH - Total on 03/12/20 38 (Gastroenterology)  Clinical ASCVD: Yes  The 10-year ASCVD risk score (Arnett DK, et al., 2019) is: 9.3%   Values used to calculate the score:     Age: 46 years     Sex: Female     Is Non-Hispanic African American: No     Diabetic: No     Tobacco smoker: Yes     Systolic Blood Pressure: 027 mmHg     Is BP treated: Yes     HDL Cholesterol: 57.9 mg/dL     Total Cholesterol: 161 mg/dL       09/21/2022   11:59 AM 06/30/2021    3:10 PM 06/25/2020   12:33 PM  Depression screen PHQ 2/9  Decreased Interest 0 0 0  Down, Depressed, Hopeless 0 0 1  PHQ - 2 Score 0 0 1    Social History   Tobacco Use  Smoking Status Every Day   Packs/day: 1.00   Years: 42.75   Total pack years: 42.75   Types: Cigarettes   Passive exposure: Never  Smokeless Tobacco Never   BP Readings from Last 3 Encounters:  09/21/22 130/88  10/10/21 138/86  08/06/21 (!) 158/104   Pulse Readings from Last 3 Encounters:  09/21/22 72  10/10/21 82  08/06/21 72   Wt Readings from Last 3 Encounters:  09/21/22 93 lb 6 oz (42.4 kg)  10/10/21 97 lb (44 kg)  08/06/21 94 lb 5.7 oz (42.8 kg)   BMI Readings from Last 3 Encounters:  09/21/22 18.70 kg/m  10/10/21 18.94 kg/m  08/06/21 18.74 kg/m    Assessment/Interventions: Review of patient past medical history,  allergies, medications, health status, including review of consultants reports, laboratory and other test data, was performed as part of comprehensive evaluation and provision of chronic care management services.   SDOH:  (Social Determinants of Health) assessments and interventions performed: No SDOH Interventions    Flowsheet Row Chronic Care Management from 04/09/2022 in [place] at [place]  Management from 09/16/2021 in [place] at [place] Management from 06/10/2021 in [place] at [employer] [facility] [facility] Patient Outreach Telephone from 04/06/2017 in [place] Patient Outreach Telephone from 02/17/2017 in [place] Patient Outreach Telephone from 01/07/2017 in [place] Interventions        Food Insecurity Interventions Intervention Not Indicated -- -- -- -- --  Depression Interventions/Treatment  -- -- -- Counseling, Currently on Treatment Counseling, Currently on Treatment Counseling, Currently on Treatment  Financial Strain Interventions Other (Comment)  [Spriva PAP in process] Other (Comment)  [Discussed Spiriva assistance options] Intervention Not Indicated -- -- --        SDOH Screenings   Food Insecurity: No Food Insecurity (04/10/2022)  Depression (PHQ2-9): Low Risk  (09/21/2022)  Financial Resource Strain: Medium Risk (04/10/2022)  Tobacco Use: High Risk (09/21/2022)    [place]  Allergies  Allergen Reactions   Carbamazepine Hives   Divalproex Sodium Swelling and Other (See Comments)    HAIR FALLS OUT   Clonazepam Other (See Comments)    HALLUCINATIONS   Diphenhydramine Hcl Rash   Tiotropium Bromide Monohydrate Other (See Comments)    CREATES YEAST IN THROAT   Vicodin [Hydrocodone-Acetaminophen] Itching    Medications Reviewed Today     Reviewed by Charlton Haws, [facility] (Pharmacist) on 10/26/22 at 1556  Med List Status: <None>   Medication Order Taking? Sig  Documenting Provider Last Dose Status Informant  albuterol (VENTOLIN HFA) 108 (90 Base) MCG/ACT inhaler [id number] Yes INHALE 2 PUFFS BY MOUTH EVERY 6 HOURS ASNEEDED WHEEEZING/ SHORTNESS OF BREATH Venia Carbon, MD Taking Active   ALPRAZolam Duanne Moron) 1 MG tablet [id number] Yes TAKE 1/2 TO 1 TABLET BY MOUTH 3 TIMES DAILY AS NEEDED Venia Carbon, MD Taking Active   cyanocobalamin (,VITAMIN B-12,) 1000 MCG/ML injection [id number] Yes Inject 1 mL (1,000 mcg total) into the muscle every 30 (thirty) days for 12 doses. Verlon Au, NP Taking Active   fluticasone [facility]) 50 MCG/ACT nasal spray [id number] Yes Place 2 sprays into both nostrils daily. [provider] Taking Active   lamoTRIgine (LAMICTAL) 100 MG tablet [id number] Yes TAKE 1 TABLET BY MOUTH TWICE DAILY Venia Carbon, MD Taking Active   losartan-hydrochlorothiazide [facility]) 100-12.5 MG tablet [id number] Yes TAKE 1 TABLET BY MOUTH ONCE DAILY Venia Carbon, MD Taking Active   methocarbamol (ROBAXIN) 500 MG tablet [id number] Yes Take 1,000 mg by mouth 3 (three) times daily as needed for muscle spasms. For 7 days (21 tablets prescribed at ED 07/12/21) [provider] Taking Active Self  ondansetron (ZOFRAN-ODT) 4 MG disintegrating tablet [id number] Yes DISSOLVE 1 TABLET ON THE TONGUE EVERY 8 HOURS AS NEEDED FOR NAUSEA OR VOMIT Venia Carbon, MD Taking Active   pantoprazole (PROTONIX) 40 MG tablet [id number] Yes Take 1 tablet (40 mg total) by mouth 2 (two) times daily before a meal. [name] B, MD Taking Active   PARoxetine (PAXIL) 20 MG tablet [id number] Yes TAKE 1 TABLET BY MOUTH ONCE DAILY Venia Carbon, MD Taking Active   Syringe/Needle, Disp, (SYRINGE 3CC/25GX1") 25G X 1" 3 ML MISC [id number] Yes 3 mLs by Does not apply route every 30 (thirty) days. Verlon Au, NP Taking Active   Tiotropium Bromide Monohydrate (SPIRIVA RESPIMAT) 2.5 MCG/ACT AERS [id number] Yes Inhale 2 puffs into the lungs daily. BI  Cares PAP Venia Carbon, MD Taking Active   Med List Note Dewayne Shorter, RN 01/29/16 1519): UDS 01-29-16  Patient Active Problem List   Diagnosis Date Noted   Urticaria 10/10/2021   Benzodiazepine dependence ([place]) 06/30/2021   Malnutrition of mild degree ([place]) 06/25/2020   Personal history of colonic polyps    Diverticulosis of large intestine without diverticulitis    Pulmonary nodules 08/08/2018   B12 deficiency 07/21/2018   Iron deficiency anemia due to chronic blood loss 07/05/2018   Polyp of colon    Acute peptic ulcer of stomach    Advance directive discussed with patient 10/27/2017   Neuropathy 08/11/2016   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   S/P Bilateral Total Shoulder Replacement (2016) 11/23/2014   Osteoarthritis of shoulder (Right) 04/13/2014   Routine general medical examination at a health care facility 04/05/2013   Restless legs syndrome (RLS) 10/21/2010   Essential hypertension, benign 06/23/2010   HYPERLIPIDEMIA-MIXED 10/23/2009   Valvular heart disease 03/27/2009   IRRITABLE BOWEL SYNDROME 11/13/2008   Mood disorder ([place]) 09/07/2007   Tobacco use disorder 09/07/2007   GLAUCOMA 09/07/2007   Esophagogastric ulcer 09/07/2007   COPD (chronic obstructive pulmonary disease) with chronic bronchitis 08/08/2007   GERD 08/08/2007   Seizure disorder ([place]) 08/08/2007    Immunization History  Administered Date(s) Administered   Influenza Split 09/28/2011   Influenza Whole 10/03/2007, 09/20/2009, 10/21/2010   Influenza, Quadrivalent, Recombinant, Inj, Pf 08/23/2020   Influenza,inj,Quad PF,6+ Mos 08/11/2016, 10/27/2017, 09/21/2022   Moderna SARS-COV2 Booster Vaccination 08/23/2020   Pneumococcal Conjugate-13 10/27/2017   Pneumococcal Polysaccharide-23 10/09/2009   Td 01/09/2003     Conditions to be addressed/monitored:  Hypertension, Hyperlipidemia, GERD, COPD, Anxiety, Osteoarthritis, Tobacco use, and Iron deficiency  Care Plan : [place]  Updates made by Charlton Haws, [place] since 10/26/2022 12:00 AM     Problem: Hypertension, Hyperlipidemia, GERD, COPD, Anxiety, Osteoarthritis, Tobacco use, and Iron deficiency   Priority: High  Onset Date: 06/10/2021     Long-Range Goal: Disease mgmt   Start Date: 10/20/2021  Expected End Date: 04/11/2023  Recent Progress: On track  Priority: High  Note:   Current Barriers:  Cost - Spiriva  Pharmacist Clinical Goal(s):  Patient will contact provider office for questions/concerns as evidenced notation of same in electronic health record through collaboration with PharmD and provider.   Interventions: 1:1 collaboration with Venia Carbon, MD regarding development and update of comprehensive plan of care as evidenced by provider attestation and co-signature Inter-disciplinary care team collaboration (see longitudinal plan of care) Comprehensive medication review performed; medication list updated in electronic medical record  Hypertension  (BP goal <140/90) -Controlled - per recent clinic readings -Current home readings: n/a -Current treatment: Losartan-HCTZ 100- 12.5 mg - 1/2 tab daily - Appropriate, Effective, Safe, Accessible -Medications previously tried: none  -Educated on BP goals and benefits of medications for prevention of heart attack, stroke and kidney damage -Recommended to continue current medication  Hyperlipidemia: (LDL goal < 100) -Controlled - LDL 86 (09/2022) reasonable; ASCVD-risk 9.3% intermediate, low threshold to start statin in future -Pt was previously on a statin, Reason for d/c not documented. LDL was up to 155, Trig 244 in 2010. -Current treatment: None -Medications previously tried:  simvastatin 20 mg (d/c'd per pain mgmt 2017 "error") - no adverse effects  documented -Continue to monitor  COPD (Goal: control symptoms and prevent exacerbations) -Controlled - pt reports improved symptoms since using Spiriva daily -  Initial pulmonology consult completed 08/23/20, Spiriva started. Pt completed PFTs but no follow up since then. PFTs considered normal. Pt has not scheduled follow up. -Gold Grade: Gold 1 (FEV1>80%) -Current COPD Classification:  B (high sx, 0-1 moderate exacerbations, no hospitalizations) -Pulmonary function testing: 09/2020; FEV1 73% predicted, ratio 0.69;  change -2%, no evidence of disease -Current treatment  Albuterol PRN - Appropriate, Effective, Safe, Accessible Spiriva Respimat 2.5 mcg/act (PAP)  - Appropriate, Effective, Safe, Accessible -Medications previously tried: Advair, Symbicort, Spiriva DPI - thrush -Exacerbations requiring treatment in last 6 months: none -Patient denies consistent use of maintenance inhaler -Counseled on Benefits of consistent maintenance inhaler use -Recommended to continue current medication; renew Spiriva PAP for 2024  Tobacco use (Goal: quit smoking) -Current smoker - 3/4 PPD -Quit history -she quit for 15 months about 20 years ago with nicotine patches/inhaler, went back to work and coworkers smoked. -Counseled on benefits of smoking cessation, offered support if she decides to quit; pt voiced understanding and will think about quitting -Lung cancer screening is due, referral has been made, pt has phone number to call and set up screening appt  Depression/Anxiety (Goal: Improve mood) -Controlled - per patient report; she takes Xanax 1 whole tablet twice daily and fill dates support this -PHQ9: 0 (06/2021) - minimal depression -Family stressors but overall stable, doing well on current therapy -Current treatment: Paroxetine 20 mg daily - Appropriate, Effective, Safe, Accessible Alprazolam 1 mg - 1/2-1 tablet TID PRN - Appropriate, Effective, Query Safe -Medications previously tried/failed:  none -Reviewed long term risks associated with benzodiazepines; she has been on alprazolam since at least 2012, tapering at this point may cause more harm than good, will reassess periodically -Recommended to continue current medication.  Health Maintenance -Vaccine gaps: Shingrix, TDAP -Mammogram is due - pt has phone number to schedule  Patient Goals/Self-Care Activities Patient will:  - take medications as prescribed as evidenced by patient report and record review focus on medication adherence by routine collaborate with provider on medication access solutions (Spiriva)     Medication Assistance:  Spiriva (BI Cares) PAP - approved through 11/08/22  Compliance/Adherence/Medication fill history: Care Gaps: Mammogram due  - pt has phone # to schedule Lung CT - pt has phone # to schedule  Star-Rating Drugs: Losartan/HCTZ - [place] 41%; LF 09/18/22 #90 -[facility]  Medication Access: Within the past 30 days, how often has patient missed a dose of medication? 0 Is a pillbox or other method used to improve adherence? Yes  Factors that may affect medication adherence? financial need Are meds synced by current pharmacy? No  Are meds delivered by current pharmacy? Yes  Does patient experience delays in picking up medications due to transportation concerns? No   [facility] Services Reviewed: Is patient disadvantaged to use [facility]?: Yes  Current Rx insurance plan: [employer] [place] Name and location of Current pharmacy:  [facility], [address] [place] [address] Phone: [phone] Fax: [phone]  [facility] services reviewed with patient today?: No  Patient requests to transfer care to [facility]?: No  Reason patient declined to change pharmacies: Loyalty to other pharmacy/Patient preference   Care Plan and Follow Up Patient Decision:  Patient agrees to Care Plan and Follow-up.  Follow Up Plan: Telephone follow up  appointment with care management team member scheduled for: 6 months  [name], PharmD, BCACP Clinical Pharmacist [facility] at [facility] [phone]

## 2022-10-30 ENCOUNTER — Other Ambulatory Visit: Payer: Self-pay | Admitting: Internal Medicine

## 2022-11-05 ENCOUNTER — Ambulatory Visit: Payer: Medicare HMO | Attending: Internal Medicine

## 2022-11-05 DIAGNOSIS — I38 Endocarditis, valve unspecified: Secondary | ICD-10-CM | POA: Diagnosis not present

## 2022-11-05 LAB — ECHOCARDIOGRAM COMPLETE
AV Mean grad: 7 mmHg
AV Peak grad: 14.3 mmHg
AV Vena cont: 0.3 cm
Ao pk vel: 1.89 m/s
Area-P 1/2: 3.95 cm2
MV M vel: 5.52 m/s
MV Peak grad: 121.9 mmHg
P 1/2 time: 405 msec
S' Lateral: 2.9 cm

## 2022-11-06 NOTE — Telephone Encounter (Signed)
Last filled 09-18-22 #90 Last OV 09-21-22 Next OV 09-23-23 [facility]

## 2022-11-06 NOTE — Telephone Encounter (Signed)
Pt called in to know status pr RX refill  ALPRAZolam ALPRAZolam (XANAX) 1 MG tablet  Please advise [phone]

## 2022-12-08 ENCOUNTER — Encounter: Payer: Self-pay | Admitting: *Deleted

## 2022-12-10 ENCOUNTER — Telehealth: Payer: Self-pay

## 2022-12-10 NOTE — Progress Notes (Addendum)
Care Management & Coordination Services Pharmacy Team  Reason for Encounter: General adherence update   Contacted patient for general health update and medication adherence call.  Spoke with patient on 12/23/2022    What concerns do you have about your medications? Spiriva is unaffordable  The patient denies side effects with their medications.   How often do you forget or accidentally miss a dose? Rarely  Do you use a pillbox? Yes  Are you having any problems getting your medications from your pharmacy? No  Has the cost of your medications been a concern? Yes  Spiriva   Since last visit with PharmD, no interventions have been made. Reminded the patient about scheduling her mammogram and Lung CT. Patient appreciated the reminder call and states she has phone numbers and will schedule soon   The patient has not had an ED visit since last contact.   The patient denies problems with their health.   Patient denies concerns or questions for [name] , PharmD at this time.   Counseled patient on: [place] job taking medications, Importance of taking medication daily without missed doses, Benefits of adherence packaging or a pillbox, and Access to carecoordination team for any cost, medication or pharmacy concerns.   Chart Updates:  Recent office visits:  None since last contact   Recent consult visits:  None since last contact   Hospital visits:  None in previous 6 months  Medications: Outpatient Encounter Medications as of 12/10/2022  Medication Sig   albuterol (VENTOLIN HFA) 108 (90 Base) MCG/ACT inhaler INHALE 2 PUFFS BY MOUTH EVERY 6 HOURS ASNEEDED WHEEEZING/ SHORTNESS OF BREATH   cyanocobalamin (,VITAMIN B-12,) 1000 MCG/ML injection Inject 1 mL (1,000 mcg total) into the muscle every 30 (thirty) days for 12 doses.   fluticasone (FLONASE) 50 MCG/ACT nasal spray Place 2 sprays into both nostrils daily.   losartan-hydrochlorothiazide (HYZAAR) 100-12.5 MG tablet  TAKE 1 TABLET BY MOUTH ONCE DAILY   methocarbamol (ROBAXIN) 500 MG tablet Take 1,000 mg by mouth 3 (three) times daily as needed for muscle spasms. For 7 days (21 tablets prescribed at ED 07/12/21)   ondansetron (ZOFRAN-ODT) 4 MG disintegrating tablet DISSOLVE 1 TABLET ON THE TONGUE EVERY 8 HOURS AS NEEDED FOR NAUSEA OR VOMIT   pantoprazole (PROTONIX) 40 MG tablet Take 1 tablet (40 mg total) by mouth 2 (two) times daily before a meal.   PARoxetine (PAXIL) 20 MG tablet TAKE 1 TABLET BY MOUTH ONCE DAILY   Syringe/Needle, Disp, (SYRINGE 3CC/25GX1") 25G X 1" 3 ML MISC 3 mLs by Does not apply route every 30 (thirty) days.   Tiotropium Bromide Monohydrate (SPIRIVA RESPIMAT) 2.5 MCG/ACT AERS Inhale 2 puffs into the lungs daily.   [DISCONTINUED] ALPRAZolam (XANAX) 1 MG tablet TAKE 1/2 TO 1 TABLET BY MOUTH 3 TIMES DAILY AS NEEDED   [DISCONTINUED] lamoTRIgine (LAMICTAL) 100 MG tablet TAKE 1 TABLET BY MOUTH TWICE DAILY   No facility-administered encounter medications on file as of 12/10/2022.    Recent vitals BP Readings from Last 3 Encounters:  09/21/22 130/88  10/10/21 138/86  08/06/21 (!) 158/104   Pulse Readings from Last 3 Encounters:  09/21/22 72  10/10/21 82  08/06/21 72   Wt Readings from Last 3 Encounters:  09/21/22 93 lb 6 oz (42.4 kg)  10/10/21 97 lb (44 kg)  08/06/21 94 lb 5.7 oz (42.8 kg)   BMI Readings from Last 3 Encounters:  09/21/22 18.70 kg/m  10/10/21 18.94 kg/m  08/06/21 18.74 kg/m    Recent lab  results    Component Value Date/Time   NA 138 09/21/2022 1223   K 3.5 09/21/2022 1223   CL 99 09/21/2022 1223   CO2 32 09/21/2022 1223   GLUCOSE 84 09/21/2022 1223   BUN 9 09/21/2022 1223   CREATININE 0.85 09/21/2022 1223   CALCIUM 9.8 09/21/2022 1223    Lab Results  Component Value Date   CREATININE 0.85 09/21/2022   GFR 73.23 09/21/2022   GFRNONAA >60 08/06/2021   GFRAA >60 08/12/2020   No results found for: "HGBA1C", "FRUCTOSAMINE", "MICROALBUR"  Lab Results   Component Value Date   CHOL 161 09/21/2022   HDL 57.90 09/21/2022   LDLCALC 86 09/21/2022   LDLDIRECT 155.7 10/10/2009   TRIG 86.0 09/21/2022   CHOLHDL 3 09/21/2022    [name], PharmD notified  Avel Sensor, [place] Assistant [phone]

## 2022-12-14 ENCOUNTER — Other Ambulatory Visit: Payer: Self-pay | Admitting: Internal Medicine

## 2022-12-14 NOTE — Telephone Encounter (Signed)
Last filled 11-06-22 #90 Last OV 09-21-22 Next OV 09-23-23 [facility]

## 2022-12-18 ENCOUNTER — Telehealth: Payer: Self-pay | Admitting: Pharmacist

## 2022-12-18 NOTE — Telephone Encounter (Signed)
Received update from [facility] regarding Spiriva application for [id number]. They have denied patient due to household income exceeding their limits, as their income limits have changed since last year.  Patient will need new Rx to fill at local pharmacy if she is to continue the medication.

## 2022-12-23 NOTE — Progress Notes (Addendum)
Patient returned my call and states she does not want prescription called in at this time.Patient states she has 1 unopened spiriva and 1 unopened albuterol at this time    Avel Sensor, [place] Assistant [phone]

## 2023-01-25 ENCOUNTER — Other Ambulatory Visit: Payer: Self-pay | Admitting: Internal Medicine

## 2023-01-26 NOTE — Telephone Encounter (Signed)
Last filled 12-14-22 #90 Last OV 09-21-22 Next OV 09-23-23 [facility]

## 2023-01-28 ENCOUNTER — Other Ambulatory Visit: Payer: Self-pay | Admitting: Internal Medicine

## 2023-01-28 DIAGNOSIS — Z1231 Encounter for screening mammogram for malignant neoplasm of breast: Secondary | ICD-10-CM

## 2023-02-19 DIAGNOSIS — G8929 Other chronic pain: Secondary | ICD-10-CM | POA: Diagnosis not present

## 2023-02-19 DIAGNOSIS — M5412 Radiculopathy, cervical region: Secondary | ICD-10-CM | POA: Diagnosis not present

## 2023-02-19 DIAGNOSIS — M542 Cervicalgia: Secondary | ICD-10-CM | POA: Diagnosis not present

## 2023-03-01 ENCOUNTER — Encounter: Payer: Self-pay | Admitting: Urgent Care

## 2023-03-03 ENCOUNTER — Encounter: Payer: Self-pay | Admitting: Urgent Care

## 2023-03-10 ENCOUNTER — Other Ambulatory Visit: Payer: Self-pay | Admitting: Internal Medicine

## 2023-03-10 ENCOUNTER — Ambulatory Visit
Admission: RE | Admit: 2023-03-10 | Discharge: 2023-03-10 | Disposition: A | Discharge status: Home / Self Care | Payer: Medicare PPO | Source: Ambulatory Visit | Attending: Internal Medicine | Admitting: Internal Medicine

## 2023-03-10 DIAGNOSIS — Z1231 Encounter for screening mammogram for malignant neoplasm of breast: Secondary | ICD-10-CM | POA: Diagnosis not present

## 2023-03-10 NOTE — Telephone Encounter (Signed)
Last filled 01-26-23 #90 Last OV 09-21-22 Next OV 09-23-23 [facility]

## 2023-03-16 DIAGNOSIS — H40153 Residual stage of open-angle glaucoma, bilateral: Secondary | ICD-10-CM | POA: Diagnosis not present

## 2023-03-24 ENCOUNTER — Telehealth: Payer: Self-pay

## 2023-03-24 NOTE — Progress Notes (Cosign Needed Addendum)
Care Management & Coordination Services Pharmacy Team  Reason for Encounter: Medication adherence  Patient is at risk for failing the Medicare Adherence STAR measure for the following medication:  Losartan-hydrochlorothiazide 100-12.5 mg  Pharmacy fill dates/day supply this year: 09/18/22 90ds  Verified with [facility]   Patient is more than 5 days past due for refill of the above medication.  Spoke with patient on 03/24/2023   44 days till refill due on 05/07/23     Patient is aware I am calling to review medications that show they are past due per insurance refill data.  Medication above is indicated for blood pressure.   How many tablets per day? 1/2 tablet    What time of day? am   What concerns do you have about this medication? Patient has 44ds in current bottle with no refills.   How often do you forget or accidentally miss a dose? Never    Do you use a pillbox? Yes   Are you having any problems getting this medication from your pharmacy? No Patient uses [facility] pharmacy    Has the cost of this medication been a concern? No    When was the prescription last filled? Unable to read date on bottle. There are 22 pills left and no refills   Do you have excess medication on hand? No  Verified the patient does not have any expired medication (bottles > 57 year old) and asked them to dispose of any medications over 83 year old.  Counseled patient on:  [place] job taking medications, Importance of taking medication daily without missed doses, Benefits of adherence packaging or a pillbox, and Access to CCM team for any cost, medication or pharmacy concerns.   Al Corpus, PharmD notified  Burt Knack, [facility] Clinical Pharmacy Assistant [phone]

## 2023-03-25 MED ORDER — LOSARTAN POTASSIUM-HCTZ 100-12.5 MG PO TABS: 0.5000 | ORAL_TABLET | Freq: Every day | ORAL | 0 refills | Status: DC

## 2023-03-25 NOTE — Addendum Note (Signed)
Addended by: Kathyrn Sheriff on: 03/25/2023 09:43 AM   Modules accepted: Orders

## 2023-03-25 NOTE — Telephone Encounter (Signed)
Updated Losartan-HCTZ sig to reflect how patient is taking is the medication (No-Print)

## 2023-04-07 ENCOUNTER — Other Ambulatory Visit: Payer: Self-pay | Admitting: Internal Medicine

## 2023-04-07 NOTE — Telephone Encounter (Signed)
Last filled 03-10-23 #90 Last OV 09-21-22 Next OV 09-23-23 [facility]

## 2023-04-12 ENCOUNTER — Other Ambulatory Visit: Payer: Self-pay | Admitting: Internal Medicine

## 2023-04-22 ENCOUNTER — Telehealth: Payer: Self-pay | Admitting: Pharmacist

## 2023-04-22 NOTE — Telephone Encounter (Signed)
Contacted patient to cancel upcoming appointment with [facility]. She does not have any medication related concerns at this time, but notes she would like to try to re-apply for assistance at the end of the year with Spiriva and Creon. She believes she filled out the applications wrong this year and was denied.   She notes her mother is in the midst of passing away, and will reach out to the office if she needs anything prior to the appointment with Dr. Alphonsus Sias later this year.   Will outreach in [place] for PAP re-enrollment.   Catie Eppie Gibson, PharmD, BCACP, CPP [facility] [phone]

## 2023-04-27 ENCOUNTER — Encounter: Payer: Medicare HMO | Admitting: Pharmacist

## 2023-06-07 ENCOUNTER — Other Ambulatory Visit: Payer: Self-pay | Admitting: Internal Medicine

## 2023-06-07 NOTE — Telephone Encounter (Signed)
Last filled 04-07-23 #90 Last OV 09-21-22 Next OV 09-23-23 [facility]

## 2023-06-21 DIAGNOSIS — M5412 Radiculopathy, cervical region: Secondary | ICD-10-CM | POA: Diagnosis not present

## 2023-06-21 DIAGNOSIS — M545 Low back pain, unspecified: Secondary | ICD-10-CM | POA: Diagnosis not present

## 2023-06-21 DIAGNOSIS — G8929 Other chronic pain: Secondary | ICD-10-CM | POA: Diagnosis not present

## 2023-06-21 DIAGNOSIS — M542 Cervicalgia: Secondary | ICD-10-CM | POA: Diagnosis not present

## 2023-06-22 DIAGNOSIS — F17210 Nicotine dependence, cigarettes, uncomplicated: Secondary | ICD-10-CM | POA: Diagnosis not present

## 2023-06-22 DIAGNOSIS — F419 Anxiety disorder, unspecified: Secondary | ICD-10-CM | POA: Diagnosis not present

## 2023-06-22 DIAGNOSIS — M25521 Pain in right elbow: Secondary | ICD-10-CM | POA: Diagnosis not present

## 2023-06-22 DIAGNOSIS — M7989 Other specified soft tissue disorders: Secondary | ICD-10-CM | POA: Diagnosis not present

## 2023-06-22 DIAGNOSIS — I10 Essential (primary) hypertension: Secondary | ICD-10-CM | POA: Diagnosis not present

## 2023-06-22 DIAGNOSIS — M7021 Olecranon bursitis, right elbow: Secondary | ICD-10-CM | POA: Diagnosis not present

## 2023-06-22 DIAGNOSIS — M19021 Primary osteoarthritis, right elbow: Secondary | ICD-10-CM | POA: Diagnosis not present

## 2023-07-08 ENCOUNTER — Other Ambulatory Visit: Payer: Self-pay | Admitting: Internal Medicine

## 2023-07-20 ENCOUNTER — Other Ambulatory Visit: Payer: Self-pay | Admitting: Internal Medicine

## 2023-07-20 NOTE — Telephone Encounter (Signed)
Last filled 06-07-23 #90 Last OV 09-21-22 Next OV 09-23-23 [facility]

## 2023-08-13 ENCOUNTER — Telehealth: Payer: Self-pay | Admitting: Internal Medicine

## 2023-08-13 ENCOUNTER — Other Ambulatory Visit: Payer: Self-pay | Admitting: Internal Medicine

## 2023-08-13 DIAGNOSIS — K219 Gastro-esophageal reflux disease without esophagitis: Secondary | ICD-10-CM

## 2023-08-13 MED ORDER — ONDANSETRON 4 MG PO TBDP: 4.0000 mg | ORAL_TABLET | Freq: Three times a day (TID) | ORAL | 0 refills | Status: DC | PRN

## 2023-08-13 NOTE — Telephone Encounter (Signed)
Prescription Request  08/13/2023  LOV: 09/21/2022  What is the name of the medication or equipment?  ondansetron (ZOFRAN-ODT) 4 MG disintegrating tablet   Have you contacted your pharmacy to request a refill? No   Which pharmacy would you like this sent to?  [facility] - [place], [place] - [address] [place] [address] Phone: [phone] Fax: [phone]    Patient notified that their request is being sent to the clinical staff for review and that they should receive a response within 2 business days.   Please advise at Mobile [phone] (mobile)

## 2023-08-13 NOTE — Telephone Encounter (Signed)
Rx sent electronically 

## 2023-08-16 NOTE — Telephone Encounter (Signed)
Last filled 07-20-23 #90 Last OV 09-21-22 Next OV 09-23-23 [facility]

## 2023-08-17 ENCOUNTER — Other Ambulatory Visit: Payer: Self-pay | Admitting: Internal Medicine

## 2023-09-13 DIAGNOSIS — Z79899 Other long term (current) drug therapy: Secondary | ICD-10-CM | POA: Diagnosis not present

## 2023-09-13 DIAGNOSIS — F419 Anxiety disorder, unspecified: Secondary | ICD-10-CM | POA: Diagnosis not present

## 2023-09-13 DIAGNOSIS — I10 Essential (primary) hypertension: Secondary | ICD-10-CM | POA: Diagnosis not present

## 2023-09-13 DIAGNOSIS — R9431 Abnormal electrocardiogram [ECG] [EKG]: Secondary | ICD-10-CM | POA: Diagnosis not present

## 2023-09-13 DIAGNOSIS — M47812 Spondylosis without myelopathy or radiculopathy, cervical region: Secondary | ICD-10-CM | POA: Diagnosis not present

## 2023-09-13 DIAGNOSIS — F17210 Nicotine dependence, cigarettes, uncomplicated: Secondary | ICD-10-CM | POA: Diagnosis not present

## 2023-09-13 DIAGNOSIS — S0003XA Contusion of scalp, initial encounter: Secondary | ICD-10-CM | POA: Diagnosis not present

## 2023-09-13 DIAGNOSIS — S0990XA Unspecified injury of head, initial encounter: Secondary | ICD-10-CM | POA: Diagnosis not present

## 2023-09-13 DIAGNOSIS — W19XXXA Unspecified fall, initial encounter: Secondary | ICD-10-CM | POA: Diagnosis not present

## 2023-09-13 DIAGNOSIS — S0101XA Laceration without foreign body of scalp, initial encounter: Secondary | ICD-10-CM | POA: Diagnosis not present

## 2023-09-13 DIAGNOSIS — M4312 Spondylolisthesis, cervical region: Secondary | ICD-10-CM | POA: Diagnosis not present

## 2023-09-13 DIAGNOSIS — R569 Unspecified convulsions: Secondary | ICD-10-CM | POA: Diagnosis not present

## 2023-09-22 ENCOUNTER — Encounter: Payer: Medicare HMO | Admitting: Internal Medicine

## 2023-09-23 ENCOUNTER — Ambulatory Visit: Payer: Medicare PPO | Admitting: Internal Medicine

## 2023-09-23 ENCOUNTER — Encounter: Payer: Self-pay | Admitting: Internal Medicine

## 2023-09-23 VITALS — BP 118/84 | HR 76 | Temp 98.7°F | Ht 59.0 in | Wt 90.0 lb

## 2023-09-23 DIAGNOSIS — Z23 Encounter for immunization: Secondary | ICD-10-CM

## 2023-09-23 DIAGNOSIS — F1320 Sedative, hypnotic or anxiolytic dependence, uncomplicated: Secondary | ICD-10-CM

## 2023-09-23 DIAGNOSIS — F39 Unspecified mood [affective] disorder: Secondary | ICD-10-CM | POA: Diagnosis not present

## 2023-09-23 DIAGNOSIS — E441 Mild protein-calorie malnutrition: Secondary | ICD-10-CM

## 2023-09-23 DIAGNOSIS — E538 Deficiency of other specified B group vitamins: Secondary | ICD-10-CM | POA: Diagnosis not present

## 2023-09-23 DIAGNOSIS — Z0000 Encounter for general adult medical examination without abnormal findings: Secondary | ICD-10-CM

## 2023-09-23 DIAGNOSIS — J4489 Other specified chronic obstructive pulmonary disease: Secondary | ICD-10-CM | POA: Diagnosis not present

## 2023-09-23 DIAGNOSIS — G40909 Epilepsy, unspecified, not intractable, without status epilepticus: Secondary | ICD-10-CM

## 2023-09-23 DIAGNOSIS — F17200 Nicotine dependence, unspecified, uncomplicated: Secondary | ICD-10-CM

## 2023-09-23 DIAGNOSIS — I10 Essential (primary) hypertension: Secondary | ICD-10-CM | POA: Diagnosis not present

## 2023-09-23 NOTE — Assessment & Plan Note (Signed)
BP Readings from Last 3 Encounters:  09/23/23 118/84  09/21/22 130/88  10/10/21 138/86   Doing losartan/hydrochlorothiazide 100/12.5 Will check labs

## 2023-09-23 NOTE — Assessment & Plan Note (Signed)
Unclear if recent fall was seizure Continues on the lamotrigine

## 2023-09-23 NOTE — Assessment & Plan Note (Signed)
I have personally reviewed the Medicare Annual Wellness questionnaire and have noted 1. The patient's medical and social history 2. Their use of alcohol, tobacco or illicit drugs 3. Their current medications and supplements 4. The patient's functional ability including ADL's, fall risks, home safety risks and hearing or visual             impairment. 5. Diet and physical activities 6. Evidence for depression or mood disorders  The patients weight, height, BMI and visual acuity have been recorded in the chart I have made referrals, counseling and provided education to the patient based review of the above and I have provided the pt with a written personalized care plan for preventive services.  I have provided you with a copy of your personalized plan for preventive services. Please take the time to review along with your updated medication list.  Mammogram done in May--needs every 1-2 years till 43 Colon again in 2027 Discussed exercise Can't stop smoking Flu vaccine today Prefers no COVID vaccine Td at pharmacy and consider shingrix

## 2023-09-23 NOTE — Progress Notes (Signed)
Subjective:    Patient ID: Molly Peters, female    DOB: 08-10-1961, 62 y.o.   MRN: [id number]  HPI Here for Medicare wellness visit and follow up of chronic health conditions Reviewed advanced directives Reviewed other doctors---Dr Mauck--pain management, Dr Lottie Rater, DrLim--neck surgeron,Dr Rao--hematology, Dr Trudee Kuster, Dr Alexandria Lodge No hospitalizations or surgery in the past year Only exercise is housework--and limited work outside.  Still smokes Occasional beer One fall that she knows of Vision is not great--but eye doctor told her no new Rx needed Hearing is okay Independent with instrumental ADLs --husband usually does the shopping Mild memory problems--nothing worrisome  Had a bad fall a few days ago Family found her on the floor--so they got her to bed Blood on pillow the next day--so she went to hospital. Had laceration on scalp--but didn't need repair. Head CT negative except for hematoma Still somewhat disoriented and having tinnitus Doesn't think it was a seizure-- no tongue bite or incontinence  Still smoking Breathing is not bad---but has more coughing lately No wheezing Easy DOE Does get dizzy at times---if she moves to quick No other syncope No chest pain No edema  Neck pain can be "rough" at times They are talking about another surgery--but she is not excited about this  Continues on xanax/paxil Does have some depression--now grieving mom a few months ago.  Chronic anxiety  Current Outpatient Medications on File Prior to Visit  Medication Sig Dispense Refill   albuterol (VENTOLIN HFA) 108 (90 Base) MCG/ACT inhaler INHALE 2 PUFFS BY MOUTH EVERY 6 HOURS ASNEEDED WHEEEZING/ SHORTNESS OF BREATH 8.5 g 1   ALPRAZolam (XANAX) 1 MG tablet TAKE 1/2 TO 1 TABLET BY MOUTH 3 TIMES DAILY AS NEEDED 90 tablet 0   lamoTRIgine (LAMICTAL) 100 MG tablet TAKE 1 TABLET BY MOUTH TWICE DAILY 180 tablet 3   losartan-hydrochlorothiazide (HYZAAR) 100-12.5 MG  tablet TAKE 1 TABLET BY MOUTH ONCE DAILY 90 tablet 0   ondansetron (ZOFRAN-ODT) 4 MG disintegrating tablet Take 1 tablet (4 mg total) by mouth every 8 (eight) hours as needed. 30 tablet 0   pantoprazole (PROTONIX) 40 MG tablet Take 1 tablet (40 mg total) by mouth 2 (two) times daily before a meal. 180 tablet 0   PARoxetine (PAXIL) 20 MG tablet TAKE 1 TABLET BY MOUTH ONCE DAILY 90 tablet 0   Syringe/Needle, Disp, (SYRINGE 3CC/25GX1") 25G X 1" 3 ML MISC 3 mLs by Does not apply route every 30 (thirty) days. 12 each 0   Tiotropium Bromide Monohydrate (SPIRIVA RESPIMAT) 2.5 MCG/ACT AERS Inhale 2 puffs into the lungs daily.     No current facility-administered medications on file prior to visit.    Allergies  Allergen Reactions   Carbamazepine Hives   Divalproex Sodium Swelling and Other (See Comments)    HAIR FALLS OUT   Clonazepam Other (See Comments)    HALLUCINATIONS   Diphenhydramine Hcl Rash   Tiotropium Bromide Monohydrate Other (See Comments)    CREATES YEAST IN THROAT   Vicodin [Hydrocodone-Acetaminophen] Itching    Past Medical History:  Diagnosis Date   Anxiety    Cervical cancer (HCC) 1980's   Chronic back pain 11/12/2009   Qualifier: Diagnosis of  By: Jillyn Hidden FNP, Mcarthur Rossetti    COPD (chronic obstructive pulmonary disease) (HCC)    states SOB with ADLs; no O2 use; able to speak in complete sentences without SOB(11/15/2014)   Depression    Difficulty swallowing pills    s/p cervical fusion   Family history of  adverse reaction to anesthesia    pt's mother and sister have hx. of post-op N/V   Fibromyalgia    GERD (gastroesophageal reflux disease)    Heart murmur    History of gastric ulcer    Hypertension    states under control with med., has been on med. x 1-2 yr.   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  Echo (10/2009) showed EF 60-65%,      normal LV size, moderate aortic insufficiency with a trileaflet aortic valve and normal aortic root size.  Mild      mitral regurgitation.    Wears dentures    lower    Past Surgical History:  Procedure Laterality Date   ABDOMINAL HYSTERECTOMY     partial   ANTERIOR CERVICAL DECOMP/DISCECTOMY FUSION  02/06/2011   C4-5, C5-6, C6-7   BREAST BIOPSY Bilateral 10+ yrs ago   neg   BREAST BIOPSY Right 08/03/2018   2 areas bx, -neg   BREAST LUMPECTOMY Bilateral 1989   x 3 - benign   BREAST LUMPECTOMY Right 08/24/2018   Procedure: BREAST LUMPECTOMY WITH ULTRASOUND IN OR;  Surgeon: Ancil Linsey, MD;  Location: [facility] ORS;  Service: General;  Laterality: Right;   CARDIAC CATHETERIZATION  1995   CESAREAN SECTION     x 2   COLONOSCOPY  09/28/2008   COLONOSCOPY N/A 06/15/2018   Procedure: COLONOSCOPY;  Surgeon: Pasty Spillers, MD;  Location: [facility] ENDOSCOPY;  Service: Endoscopy;  Laterality: N/A;   COLONOSCOPY WITH PROPOFOL N/A 05/04/2019   Procedure: COLONOSCOPY WITH PROPOFOL;  Surgeon: Pasty Spillers, MD;  Location: [facility] ENDOSCOPY;  Service: Endoscopy;  Laterality: N/A;   COLONOSCOPY WITH PROPOFOL N/A 04/21/2021   Procedure: COLONOSCOPY WITH PROPOFOL;  Surgeon: Pasty Spillers, MD;  Location: [facility] ENDOSCOPY;  Service: Endoscopy;  Laterality: N/A;   ESOPHAGOGASTRODUODENOSCOPY  09/28/2008   ESOPHAGOGASTRODUODENOSCOPY N/A 06/14/2018   Procedure: ESOPHAGOGASTRODUODENOSCOPY (EGD);  Surgeon: Pasty Spillers, MD;  Location: [facility] ENDOSCOPY;  Service: Endoscopy;  Laterality: N/A;    ESOPHAGOGASTRODUODENOSCOPY (EGD) WITH PROPOFOL N/A 05/04/2019   Procedure: ESOPHAGOGASTRODUODENOSCOPY (EGD) WITH PROPOFOL;  Surgeon: Pasty Spillers, MD;  Location: [facility] ENDOSCOPY;  Service: Endoscopy;  Laterality: N/A;   ESOPHAGOGASTRODUODENOSCOPY (EGD) WITH PROPOFOL N/A 03/18/2020   Procedure: ESOPHAGOGASTRODUODENOSCOPY (EGD) WITH PROPOFOL;  Surgeon: Toney Reil, MD;  Location: [facility] ENDOSCOPY;  Service: Gastroenterology;  Laterality: N/A;   ESOPHAGOGASTRODUODENOSCOPY (EGD) WITH PROPOFOL N/A 04/21/2021   Procedure: ESOPHAGOGASTRODUODENOSCOPY (EGD) WITH PROPOFOL;  Surgeon: Pasty Spillers, MD;  Location: [facility] ENDOSCOPY;  Service: Endoscopy;  Laterality: N/A;   HARDWARE REMOVAL  11/17/2006   L5-S1   LAMINECTOMY WITH POSTERIOR LATERAL ARTHRODESIS LEVEL 3  12/06/2009   with synovial cyst resection L3-4 bilat.   LAMINECTOMY WITH POSTERIOR LATERAL ARTHRODESIS LEVEL 3  11/17/2006   L4-5   NM MYOVIEW LTD     Lexiscan myoview (10/2009): EF 77%, normal  wall motion, normal perfusion.    SHOULDER ARTHROSCOPY WITH DEBRIDEMENT AND BICEP TENDON REPAIR Right 04/13/2014   Procedure: RIGHT SHOULDER ARTHROSCOPY WITH DEBRIDEMENT EXTENSIVE;  Surgeon: Eulas Post, MD;  Location: [facility];  Service: Orthopedics;  Laterality: Right;   TOTAL SHOULDER ARTHROPLASTY Right 11/23/2014   Procedure: TOTAL RIGHT SHOULDER ARTHROPLASTY;  Surgeon: Eulas Post, MD;  Location: [facility];  Service: Orthopedics;  Laterality: Right;   TOTAL SHOULDER ARTHROPLASTY Left 07/05/2015   Procedure: LEFT TOTAL SHOULDER REPLACEMENT;  Surgeon: Teryl Lucy, MD;  Location:  SURGERY CENTER;  Service: Orthopedics;  Laterality: Left;    Family History  Problem Relation Age of Onset   Cervical cancer Mother    Anesthesia problems Mother        post-op N/V   Rheum arthritis Mother    Anxiety disorder Mother    Cancer Father        colon cancer   Migraines Sister    Cervical cancer  Sister    Anesthesia problems Sister        post-op N/V   Migraines Brother    Asthma Daughter    Cerebral palsy Daughter        age 56   Breast cancer Maternal Aunt    Breast cancer Maternal Aunt    Breast cancer Maternal Aunt    Colon cancer Paternal Uncle    Lung cancer Paternal Uncle    Coronary artery disease Maternal Grandmother    Hypertension Maternal Grandmother    Diabetes Maternal Grandmother    Coronary artery disease Paternal Grandmother    Hypertension Paternal Grandmother    Diabetes Paternal Grandmother     Social History   Socioeconomic History   Marital status: Married    Spouse name: Not on file   Number of children: 3   Years of education: Not on file   Highest education level: Not on file  Occupational History   Occupation: Disabled 2003  Tobacco Use   Smoking status: Every Day    Current packs/day: 1.00    Average packs/day: 1 pack/day for 42.8 years (42.8 ttl pk-yrs)    Types: Cigarettes    Passive exposure: Never   Smokeless tobacco: Never  Vaping Use   Vaping status: Never Used  Substance and Sexual Activity   Alcohol use: Yes    Alcohol/week: 0.0 standard drinks of alcohol    Comment: rare   Drug use: Yes    Types: Marijuana    Comment: marijuana 2 days ago   Sexual activity: Not on file  Other Topics Concern   Not on file  Social History Narrative   No living will   Husband, then sons Daniel Nones or Clide Cliff, should make decisions   Requests DNR--done 06/25/20   No tube feeds if cognitively unaware   Social Determinants of Health   Financial Resource Strain: Medium Risk (04/10/2022)   Overall Financial Resource Strain (CARDIA)    Difficulty of Paying Living Expenses: Somewhat hard  Food Insecurity: No Food Insecurity (04/10/2022)   Hunger Vital Sign    Worried About Running Out of Food in the Last Year: Never true    Ran Out of Food in the Last Year: Never true  Transportation Needs: Not on file  Physical Activity: Not on file   Stress: Not on file  Social Connections: Not on file  Intimate Partner Violence: Not on file   Review of Systems Has lost a few pounds Not sleeping well--long  standing (relates to neck problems and arm going numb wakes her) Wears seat belt Dentures--but needs new ones on bottom Not much heartburn---takes protonix daily. No dysphagia Bowels move okay--loose at times and some urgency in AM Also has low back pain at times No suspicious skin lesions    Objective:   Physical Exam Constitutional:      Appearance: Normal appearance.     Comments: Some wasting  HENT:     Mouth/Throat:     Pharynx: No oropharyngeal exudate or posterior oropharyngeal erythema.  Eyes:     Conjunctiva/sclera: Conjunctivae normal.     Pupils: Pupils are equal, round, and reactive to light.  Cardiovascular:     Rate and Rhythm: Normal rate and regular rhythm.     Pulses: Normal pulses.     Heart sounds: No murmur heard.    No gallop.  Pulmonary:     Effort: Pulmonary effort is normal.     Breath sounds: No wheezing or rales.     Comments: Decreased breath sounds Abdominal:     Palpations: Abdomen is soft.     Tenderness: There is no abdominal tenderness.  Musculoskeletal:     Cervical back: Neck supple.     Right lower leg: No edema.     Left lower leg: No edema.  Lymphadenopathy:     Cervical: No cervical adenopathy.  Skin:    Findings: No rash.  Neurological:     General: No focal deficit present.     Mental Status: She is alert and oriented to person, place, and time.     Comments: Word naming--10/1 minute Recall 1/3  Psychiatric:        Mood and Affect: Mood normal.        Behavior: Behavior normal.            Assessment & Plan:

## 2023-09-23 NOTE — Progress Notes (Signed)
Hearing Screening - Comments:: Passed whisper test Vision Screening - Comments:: December 2023

## 2023-09-23 NOTE — Assessment & Plan Note (Signed)
Has the albuterol for prn use

## 2023-09-23 NOTE — Assessment & Plan Note (Signed)
Chronic anxiety Some dysthymia On the paroxetine 20mg  and xanax

## 2023-09-23 NOTE — Assessment & Plan Note (Signed)
Just can't stop

## 2023-09-23 NOTE — Assessment & Plan Note (Signed)
For anxiety PDMP reviewed  No concerns

## 2023-09-23 NOTE — Assessment & Plan Note (Signed)
Discussed using boost/ensure regularly

## 2023-09-23 NOTE — Addendum Note (Signed)
Addended by: Eual Fines on: 09/23/2023 03:16 PM   Modules accepted: Orders

## 2023-09-24 LAB — COMPREHENSIVE METABOLIC PANEL
ALT: 7 U/L (ref 0–35)
AST: 17 U/L (ref 0–37)
Albumin: 4.4 g/dL (ref 3.5–5.2)
Alkaline Phosphatase: 85 U/L (ref 39–117)
BUN: 13 mg/dL (ref 6–23)
CO2: 29 meq/L (ref 19–32)
Calcium: 9.8 mg/dL (ref 8.4–10.5)
Chloride: 102 meq/L (ref 96–112)
Creatinine, Ser: 1.04 mg/dL (ref 0.40–1.20)
GFR: 57.08 mL/min — ABNORMAL LOW (ref >60.00)
Glucose, Bld: 75 mg/dL (ref 70–99)
Potassium: 3.6 meq/L (ref 3.5–5.1)
Sodium: 143 meq/L (ref 135–145)
Total Bilirubin: 0.3 mg/dL (ref 0.2–1.2)
Total Protein: 7.1 g/dL (ref 6.0–8.3)

## 2023-09-24 LAB — VITAMIN B12: Vitamin B-12: 322 pg/mL (ref 211–911)

## 2023-09-24 LAB — CBC
HCT: 41.3 % (ref 36.0–46.0)
Hemoglobin: 14 g/dL (ref 12.0–15.0)
MCHC: 34 g/dL (ref 30.0–36.0)
MCV: 99.8 fL (ref 78.0–100.0)
Platelets: 404 10*3/uL — ABNORMAL HIGH (ref 150.0–400.0)
RBC: 4.14 Mil/uL (ref 3.87–5.11)
RDW: 13.7 % (ref 11.5–15.5)
WBC: 7.8 10*3/uL (ref 4.0–10.5)

## 2023-09-24 LAB — TSH: TSH: 1.01 u[IU]/mL (ref 0.35–5.50)

## 2023-09-26 LAB — LAMOTRIGINE LEVEL: Lamotrigine Lvl: 6.3 ug/mL (ref 2.5–15.0)

## 2023-10-11 ENCOUNTER — Other Ambulatory Visit: Payer: Self-pay

## 2023-10-11 DIAGNOSIS — F17210 Nicotine dependence, cigarettes, uncomplicated: Secondary | ICD-10-CM

## 2023-10-11 DIAGNOSIS — Z122 Encounter for screening for malignant neoplasm of respiratory organs: Secondary | ICD-10-CM

## 2023-10-11 DIAGNOSIS — Z87891 Personal history of nicotine dependence: Secondary | ICD-10-CM

## 2023-10-13 ENCOUNTER — Other Ambulatory Visit: Payer: Self-pay | Admitting: Internal Medicine

## 2023-10-13 NOTE — Telephone Encounter (Signed)
Last filled 08-16-23 #90 Last OV 09-23-23 No Future OV [facility]

## 2023-10-26 ENCOUNTER — Ambulatory Visit: Payer: Medicare PPO

## 2023-10-29 ENCOUNTER — Other Ambulatory Visit: Payer: Self-pay | Admitting: Internal Medicine

## 2023-10-29 DIAGNOSIS — K219 Gastro-esophageal reflux disease without esophagitis: Secondary | ICD-10-CM

## 2023-11-26 ENCOUNTER — Other Ambulatory Visit: Payer: Self-pay | Admitting: Internal Medicine

## 2023-11-26 NOTE — Telephone Encounter (Signed)
Refill request for ALPRAZOLAM 1 MG TAB   LOV - 09/23/23 Next OV - not scheduled Last refill - 10/13/23 #90/0

## 2023-12-16 ENCOUNTER — Other Ambulatory Visit: Payer: Self-pay | Admitting: Internal Medicine

## 2024-01-03 ENCOUNTER — Other Ambulatory Visit: Payer: Self-pay | Admitting: Internal Medicine

## 2024-01-04 ENCOUNTER — Other Ambulatory Visit: Payer: Self-pay | Admitting: Internal Medicine

## 2024-01-04 NOTE — Telephone Encounter (Signed)
 Last filled 11-27-23 #90 Last OV 09-23-23 No Future OV  [facility]

## 2024-02-10 ENCOUNTER — Other Ambulatory Visit: Payer: Self-pay | Admitting: Internal Medicine

## 2024-02-10 NOTE — Telephone Encounter (Signed)
 Last filled 01-04-24 #90 Last OV 09-23-23 No Future OV  [facility]

## 2024-03-22 ENCOUNTER — Ambulatory Visit: Payer: Self-pay

## 2024-03-22 NOTE — Telephone Encounter (Signed)
 Copied from CRM [id number]. Topic: Clinical - Red Word Triage >> Mar 22, 2024  1:11 PM Freya Jesus wrote: Red Word that prompted transfer to Nurse Triage: Molly Peters 3 weeks or maybe a month ago and hit her left side on rocks. Having severe headaches in the middle of the night.  Per agent, pt wants to get referral.   Chief Complaint: head injury Symptoms: prior fall with head injury untreated, potential LOC for 1-2 min upon impact per pt, intermittent severe headaches that wake her "screaming," fatigue, headaches, neck pain, bumps on head, worse pain with bending over, constant dizziness, off-balance, new weakness since fall, nausea, vision changes in left eye also present now, hx epilepsy feeling aura more but without seizing Frequency: continual Pertinent Negatives: Patient denies vomiting, current bleeding, watery or blood-tinged fluid from nose or ears Disposition: [] 911 / [x] ED /[] Urgent Care (no appt availability in office) / [] Appointment(In office/virtual)/ []  [place] [facility]/ [] Home Care/ [] Refused Recommended Disposition /[] [place] [facility]/ []  Follow-up with PCP Additional Notes: Pt reporting that she fell a few weeks ago, unsure of exact date, after climbing on lawnmower deck, foot slipped, and went backward, confirms impact to head right behind left temple, neck and shoulder, "may have lost consciousness for a minute or two," had a couple little cuts and some bumps on head. Pt reporting that she has not gone to doc for this, confirms that been having severe headaches including those for which she "wake up lot of times screaming" with "10+" pain that doesn't go away "until later," also experiencing fatigue, nausea "a lot," neck pain, constant dizziness and feeling off-balance, left eye vision changes, pain intermittent during day and worse with bending over, as well as new weakness all since the fall. Nurse asking questions about weakness, pt does not confirm or deny qualities of her  weakness, just that it is new since the fall. Pt confirms that she has hx of epilepsy as well, been feeling more frequently like about to have seizure "but doesn't happen." Pt reporting hx of concussion on right side of head from fall 3-4 months ago but this one is "10x worse," "maybe cracked or fractured something." Advised pt go to ED immediately and have another adult drive her, call [phone] if any worsening. Pt verbalized understanding, will have husband drive her.  Reason for Disposition  [1] Loss of vision or double vision AND [2] present now  Answer Assessment - Initial Assessment Questions 1. MECHANISM: "How did the fall happen?"     Was climbing on lawnmower deck, foot slipped and went backward, could feel rocks in left side of my head, since then wake up lot of times screaming with a severe headache that you cannot get rid of until later, have tried to deal with it hoping it would go away but so sleepy and tired, head aches and neck aches, wanting to see if would refer him to neurologist 3. ONSET: "When did the fall happen?" (e.g., minutes, hours, or days ago)     Few weeks ago 4. LOCATION: "What part of the body hit the ground?" (e.g., back, buttocks, head, hips, knees, hands, head, stomach)     Head neck and shoulder 5. INJURY: "Did you hurt (injure) yourself when you fell?" If Yes, ask: "What did you injure? Tell me more about this?" (e.g., body area; type of injury; pain severity)"     Had a couple little cuts and some bumps on head 6. PAIN: "Is there any pain?" If Yes,  ask: "How bad is the pain?" (e.g., Scale 1-10; or mild,  moderate, severe)   - NONE (0): No pain   - MILD (1-3): Doesn't interfere with normal activities    - MODERATE (4-7): Interferes with normal activities or awakens from sleep    - SEVERE (8-10): Excruciating pain, unable to do any normal activities      When it wakes me up in middle of night 10+, sometimes pain during day, can't bend over 9. OTHER SYMPTOMS: "Do you  have any other symptoms?" (e.g., dizziness, fever, weakness; new onset or worsening).      Dizziness, off-balance, right much constant, weakness new since last fall  Answer Assessment - Initial Assessment Questions 3. NEUROLOGIC SYMPTOMS: "Was there any loss of consciousness?" "Are there any other neurological symptoms?"      May have lost consciousness for a minute or 2 5. LOCATION: "What part of the head was hit?"      Right behind temple  Been nauseous a lot Maybe cracked or fractured something Had concussion on right side from first fall 3-4 months but this one is 10x worse Vision changes in left eye, present now Hx epilepsy Feel like you're about to have seizures but doesn't happen  Protocols used: Falls and Falling-A-AH, Head Injury-A-AH

## 2024-03-23 NOTE — Telephone Encounter (Signed)
 Called and spoke to pt. She slept most of the day and night. Said last night was a good night. She knows she needs to go get checked out. Afraid to drive. Husband usually gets home about 7 or 8 pm. She will probably go to [facility].

## 2024-03-30 ENCOUNTER — Other Ambulatory Visit: Payer: Self-pay | Admitting: Internal Medicine

## 2024-03-30 NOTE — Telephone Encounter (Signed)
 Last filled 02-10-24 #90 Last OV 09-23-23 No Future OV  [facility]

## 2024-04-12 ENCOUNTER — Other Ambulatory Visit: Payer: Self-pay | Admitting: Internal Medicine

## 2024-05-09 ENCOUNTER — Other Ambulatory Visit: Payer: Self-pay | Admitting: Internal Medicine

## 2024-05-09 NOTE — Telephone Encounter (Signed)
 Last filled 03-30-24 #90 Last OV 09-23-23 No Future OV  [facility]

## 2024-05-28 DIAGNOSIS — S9002XA Contusion of left ankle, initial encounter: Secondary | ICD-10-CM | POA: Diagnosis not present

## 2024-05-28 DIAGNOSIS — I10 Essential (primary) hypertension: Secondary | ICD-10-CM | POA: Diagnosis not present

## 2024-05-28 DIAGNOSIS — J948 Other specified pleural conditions: Secondary | ICD-10-CM | POA: Diagnosis not present

## 2024-05-28 DIAGNOSIS — G259 Extrapyramidal and movement disorder, unspecified: Secondary | ICD-10-CM | POA: Diagnosis not present

## 2024-05-28 DIAGNOSIS — S22070A Wedge compression fracture of T9-T10 vertebra, initial encounter for closed fracture: Secondary | ICD-10-CM | POA: Diagnosis not present

## 2024-05-28 DIAGNOSIS — S22080A Wedge compression fracture of T11-T12 vertebra, initial encounter for closed fracture: Secondary | ICD-10-CM | POA: Diagnosis not present

## 2024-05-28 DIAGNOSIS — R569 Unspecified convulsions: Secondary | ICD-10-CM | POA: Diagnosis not present

## 2024-05-28 DIAGNOSIS — R519 Headache, unspecified: Secondary | ICD-10-CM | POA: Diagnosis not present

## 2024-05-28 DIAGNOSIS — M4854XA Collapsed vertebra, not elsewhere classified, thoracic region, initial encounter for fracture: Secondary | ICD-10-CM | POA: Diagnosis not present

## 2024-05-28 DIAGNOSIS — M4185 Other forms of scoliosis, thoracolumbar region: Secondary | ICD-10-CM | POA: Diagnosis not present

## 2024-05-28 DIAGNOSIS — S92012A Displaced fracture of body of left calcaneus, initial encounter for closed fracture: Secondary | ICD-10-CM | POA: Diagnosis not present

## 2024-05-28 DIAGNOSIS — S92062A Displaced intraarticular fracture of left calcaneus, initial encounter for closed fracture: Secondary | ICD-10-CM | POA: Diagnosis not present

## 2024-05-28 DIAGNOSIS — M47894 Other spondylosis, thoracic region: Secondary | ICD-10-CM | POA: Diagnosis not present

## 2024-05-28 DIAGNOSIS — Z981 Arthrodesis status: Secondary | ICD-10-CM | POA: Diagnosis not present

## 2024-05-28 DIAGNOSIS — J439 Emphysema, unspecified: Secondary | ICD-10-CM | POA: Diagnosis not present

## 2024-05-28 DIAGNOSIS — M47812 Spondylosis without myelopathy or radiculopathy, cervical region: Secondary | ICD-10-CM | POA: Diagnosis not present

## 2024-05-28 DIAGNOSIS — S9032XA Contusion of left foot, initial encounter: Secondary | ICD-10-CM | POA: Diagnosis not present

## 2024-05-29 DIAGNOSIS — M79672 Pain in left foot: Secondary | ICD-10-CM | POA: Diagnosis not present

## 2024-05-29 DIAGNOSIS — S92002A Unspecified fracture of left calcaneus, initial encounter for closed fracture: Secondary | ICD-10-CM | POA: Diagnosis not present

## 2024-05-29 DIAGNOSIS — K219 Gastro-esophageal reflux disease without esophagitis: Secondary | ICD-10-CM | POA: Diagnosis not present

## 2024-05-29 DIAGNOSIS — I10 Essential (primary) hypertension: Secondary | ICD-10-CM | POA: Diagnosis not present

## 2024-05-29 DIAGNOSIS — M79605 Pain in left leg: Secondary | ICD-10-CM | POA: Diagnosis not present

## 2024-05-29 DIAGNOSIS — S92062A Displaced intraarticular fracture of left calcaneus, initial encounter for closed fracture: Secondary | ICD-10-CM | POA: Diagnosis not present

## 2024-05-29 DIAGNOSIS — R262 Difficulty in walking, not elsewhere classified: Secondary | ICD-10-CM | POA: Diagnosis not present

## 2024-05-29 DIAGNOSIS — S92012D Displaced fracture of body of left calcaneus, subsequent encounter for fracture with routine healing: Secondary | ICD-10-CM | POA: Diagnosis not present

## 2024-05-29 DIAGNOSIS — T849XXA Unspecified complication of internal orthopedic prosthetic device, implant and graft, initial encounter: Secondary | ICD-10-CM | POA: Diagnosis not present

## 2024-05-29 DIAGNOSIS — J449 Chronic obstructive pulmonary disease, unspecified: Secondary | ICD-10-CM | POA: Diagnosis not present

## 2024-05-29 DIAGNOSIS — E785 Hyperlipidemia, unspecified: Secondary | ICD-10-CM | POA: Diagnosis not present

## 2024-05-29 DIAGNOSIS — Z79891 Long term (current) use of opiate analgesic: Secondary | ICD-10-CM | POA: Diagnosis not present

## 2024-05-29 DIAGNOSIS — Z885 Allergy status to narcotic agent status: Secondary | ICD-10-CM | POA: Diagnosis not present

## 2024-06-02 DIAGNOSIS — F419 Anxiety disorder, unspecified: Secondary | ICD-10-CM | POA: Diagnosis not present

## 2024-06-02 DIAGNOSIS — K219 Gastro-esophageal reflux disease without esophagitis: Secondary | ICD-10-CM | POA: Diagnosis not present

## 2024-06-02 DIAGNOSIS — Z9181 History of falling: Secondary | ICD-10-CM | POA: Diagnosis not present

## 2024-06-02 DIAGNOSIS — I10 Essential (primary) hypertension: Secondary | ICD-10-CM | POA: Diagnosis not present

## 2024-06-02 DIAGNOSIS — F17210 Nicotine dependence, cigarettes, uncomplicated: Secondary | ICD-10-CM | POA: Diagnosis not present

## 2024-06-02 DIAGNOSIS — J449 Chronic obstructive pulmonary disease, unspecified: Secondary | ICD-10-CM | POA: Diagnosis not present

## 2024-06-02 DIAGNOSIS — Z96611 Presence of right artificial shoulder joint: Secondary | ICD-10-CM | POA: Diagnosis not present

## 2024-06-02 DIAGNOSIS — S92062D Displaced intraarticular fracture of left calcaneus, subsequent encounter for fracture with routine healing: Secondary | ICD-10-CM | POA: Diagnosis not present

## 2024-06-02 DIAGNOSIS — E785 Hyperlipidemia, unspecified: Secondary | ICD-10-CM | POA: Diagnosis not present

## 2024-06-05 DIAGNOSIS — J449 Chronic obstructive pulmonary disease, unspecified: Secondary | ICD-10-CM | POA: Diagnosis not present

## 2024-06-05 DIAGNOSIS — Z9181 History of falling: Secondary | ICD-10-CM | POA: Diagnosis not present

## 2024-06-05 DIAGNOSIS — F419 Anxiety disorder, unspecified: Secondary | ICD-10-CM | POA: Diagnosis not present

## 2024-06-05 DIAGNOSIS — S92062D Displaced intraarticular fracture of left calcaneus, subsequent encounter for fracture with routine healing: Secondary | ICD-10-CM | POA: Diagnosis not present

## 2024-06-05 DIAGNOSIS — Z96611 Presence of right artificial shoulder joint: Secondary | ICD-10-CM | POA: Diagnosis not present

## 2024-06-05 DIAGNOSIS — I10 Essential (primary) hypertension: Secondary | ICD-10-CM | POA: Diagnosis not present

## 2024-06-05 DIAGNOSIS — K219 Gastro-esophageal reflux disease without esophagitis: Secondary | ICD-10-CM | POA: Diagnosis not present

## 2024-06-05 DIAGNOSIS — E785 Hyperlipidemia, unspecified: Secondary | ICD-10-CM | POA: Diagnosis not present

## 2024-06-05 DIAGNOSIS — F17210 Nicotine dependence, cigarettes, uncomplicated: Secondary | ICD-10-CM | POA: Diagnosis not present

## 2024-06-06 ENCOUNTER — Telehealth: Payer: Self-pay

## 2024-06-06 NOTE — Telephone Encounter (Signed)
 I called and spoke to Molly Peters. Advised her that we have not seen the pt since her injury and cannot answer any questions as to what she should and should not be taking. Also, Dr Jimmy is retiring at the end of August so he would only be doing orders up to that day. She will reach out to the pt.

## 2024-06-06 NOTE — Telephone Encounter (Signed)
 Copied from CRM [id number]. Topic: Clinical - Home Health Verbal Orders >> Jun 06, 2024  2:02 PM Burnard DEL wrote: Caller/Agency: [facility] Callback Number: [phone] Service Requested: Physical Therapy Frequency: 1wk 8 Any new concerns about the patient? Yes Patient has medication Naloxone,Simvastatin ,Spiriva  on her hospital discharge paperwork.They would like to know if the patient needs these medications?

## 2024-06-20 DIAGNOSIS — S92002A Unspecified fracture of left calcaneus, initial encounter for closed fracture: Secondary | ICD-10-CM | POA: Diagnosis not present

## 2024-06-20 DIAGNOSIS — S92062A Displaced intraarticular fracture of left calcaneus, initial encounter for closed fracture: Secondary | ICD-10-CM | POA: Diagnosis not present

## 2024-06-20 DIAGNOSIS — F17219 Nicotine dependence, cigarettes, with unspecified nicotine-induced disorders: Secondary | ICD-10-CM | POA: Diagnosis not present

## 2024-06-22 ENCOUNTER — Other Ambulatory Visit: Payer: Self-pay | Admitting: Internal Medicine

## 2024-06-22 NOTE — Telephone Encounter (Signed)
 Last filled 05-09-24 #90 Last OV 09-23-23 Next OV with Dr Randeen 07-11-24 [facility]

## 2024-06-23 DIAGNOSIS — J449 Chronic obstructive pulmonary disease, unspecified: Secondary | ICD-10-CM | POA: Diagnosis not present

## 2024-06-23 DIAGNOSIS — I10 Essential (primary) hypertension: Secondary | ICD-10-CM | POA: Diagnosis not present

## 2024-06-23 DIAGNOSIS — S92062D Displaced intraarticular fracture of left calcaneus, subsequent encounter for fracture with routine healing: Secondary | ICD-10-CM | POA: Diagnosis not present

## 2024-06-23 DIAGNOSIS — F17210 Nicotine dependence, cigarettes, uncomplicated: Secondary | ICD-10-CM | POA: Diagnosis not present

## 2024-06-23 DIAGNOSIS — E785 Hyperlipidemia, unspecified: Secondary | ICD-10-CM | POA: Diagnosis not present

## 2024-06-23 DIAGNOSIS — K219 Gastro-esophageal reflux disease without esophagitis: Secondary | ICD-10-CM | POA: Diagnosis not present

## 2024-06-23 DIAGNOSIS — Z96611 Presence of right artificial shoulder joint: Secondary | ICD-10-CM | POA: Diagnosis not present

## 2024-06-23 DIAGNOSIS — F419 Anxiety disorder, unspecified: Secondary | ICD-10-CM | POA: Diagnosis not present

## 2024-06-23 DIAGNOSIS — Z9181 History of falling: Secondary | ICD-10-CM | POA: Diagnosis not present

## 2024-06-27 DIAGNOSIS — Z9181 History of falling: Secondary | ICD-10-CM | POA: Diagnosis not present

## 2024-06-27 DIAGNOSIS — F17210 Nicotine dependence, cigarettes, uncomplicated: Secondary | ICD-10-CM | POA: Diagnosis not present

## 2024-06-27 DIAGNOSIS — F419 Anxiety disorder, unspecified: Secondary | ICD-10-CM | POA: Diagnosis not present

## 2024-06-27 DIAGNOSIS — Z96611 Presence of right artificial shoulder joint: Secondary | ICD-10-CM | POA: Diagnosis not present

## 2024-06-27 DIAGNOSIS — E785 Hyperlipidemia, unspecified: Secondary | ICD-10-CM | POA: Diagnosis not present

## 2024-06-27 DIAGNOSIS — I10 Essential (primary) hypertension: Secondary | ICD-10-CM | POA: Diagnosis not present

## 2024-06-27 DIAGNOSIS — J449 Chronic obstructive pulmonary disease, unspecified: Secondary | ICD-10-CM | POA: Diagnosis not present

## 2024-06-27 DIAGNOSIS — S92062D Displaced intraarticular fracture of left calcaneus, subsequent encounter for fracture with routine healing: Secondary | ICD-10-CM | POA: Diagnosis not present

## 2024-06-27 DIAGNOSIS — K219 Gastro-esophageal reflux disease without esophagitis: Secondary | ICD-10-CM | POA: Diagnosis not present

## 2024-06-28 DIAGNOSIS — I10 Essential (primary) hypertension: Secondary | ICD-10-CM | POA: Diagnosis not present

## 2024-06-28 DIAGNOSIS — J449 Chronic obstructive pulmonary disease, unspecified: Secondary | ICD-10-CM | POA: Diagnosis not present

## 2024-06-28 DIAGNOSIS — Z9181 History of falling: Secondary | ICD-10-CM | POA: Diagnosis not present

## 2024-06-28 DIAGNOSIS — Z96611 Presence of right artificial shoulder joint: Secondary | ICD-10-CM | POA: Diagnosis not present

## 2024-06-28 DIAGNOSIS — F17210 Nicotine dependence, cigarettes, uncomplicated: Secondary | ICD-10-CM | POA: Diagnosis not present

## 2024-06-28 DIAGNOSIS — K219 Gastro-esophageal reflux disease without esophagitis: Secondary | ICD-10-CM | POA: Diagnosis not present

## 2024-06-28 DIAGNOSIS — F419 Anxiety disorder, unspecified: Secondary | ICD-10-CM | POA: Diagnosis not present

## 2024-06-28 DIAGNOSIS — E785 Hyperlipidemia, unspecified: Secondary | ICD-10-CM | POA: Diagnosis not present

## 2024-06-28 DIAGNOSIS — S92062D Displaced intraarticular fracture of left calcaneus, subsequent encounter for fracture with routine healing: Secondary | ICD-10-CM | POA: Diagnosis not present

## 2024-07-03 ENCOUNTER — Other Ambulatory Visit: Payer: Self-pay | Admitting: Internal Medicine

## 2024-07-03 DIAGNOSIS — J449 Chronic obstructive pulmonary disease, unspecified: Secondary | ICD-10-CM | POA: Diagnosis not present

## 2024-07-03 DIAGNOSIS — S92062D Displaced intraarticular fracture of left calcaneus, subsequent encounter for fracture with routine healing: Secondary | ICD-10-CM | POA: Diagnosis not present

## 2024-07-03 DIAGNOSIS — E785 Hyperlipidemia, unspecified: Secondary | ICD-10-CM | POA: Diagnosis not present

## 2024-07-03 DIAGNOSIS — Z96611 Presence of right artificial shoulder joint: Secondary | ICD-10-CM | POA: Diagnosis not present

## 2024-07-03 DIAGNOSIS — Z9181 History of falling: Secondary | ICD-10-CM | POA: Diagnosis not present

## 2024-07-03 DIAGNOSIS — F17210 Nicotine dependence, cigarettes, uncomplicated: Secondary | ICD-10-CM | POA: Diagnosis not present

## 2024-07-03 DIAGNOSIS — F419 Anxiety disorder, unspecified: Secondary | ICD-10-CM | POA: Diagnosis not present

## 2024-07-03 DIAGNOSIS — I10 Essential (primary) hypertension: Secondary | ICD-10-CM | POA: Diagnosis not present

## 2024-07-03 DIAGNOSIS — K219 Gastro-esophageal reflux disease without esophagitis: Secondary | ICD-10-CM | POA: Diagnosis not present

## 2024-07-04 DIAGNOSIS — F17210 Nicotine dependence, cigarettes, uncomplicated: Secondary | ICD-10-CM | POA: Diagnosis not present

## 2024-07-04 DIAGNOSIS — E785 Hyperlipidemia, unspecified: Secondary | ICD-10-CM | POA: Diagnosis not present

## 2024-07-04 DIAGNOSIS — Z9181 History of falling: Secondary | ICD-10-CM | POA: Diagnosis not present

## 2024-07-04 DIAGNOSIS — F419 Anxiety disorder, unspecified: Secondary | ICD-10-CM | POA: Diagnosis not present

## 2024-07-04 DIAGNOSIS — Z96611 Presence of right artificial shoulder joint: Secondary | ICD-10-CM | POA: Diagnosis not present

## 2024-07-04 DIAGNOSIS — J449 Chronic obstructive pulmonary disease, unspecified: Secondary | ICD-10-CM | POA: Diagnosis not present

## 2024-07-04 DIAGNOSIS — S92062D Displaced intraarticular fracture of left calcaneus, subsequent encounter for fracture with routine healing: Secondary | ICD-10-CM | POA: Diagnosis not present

## 2024-07-04 DIAGNOSIS — K219 Gastro-esophageal reflux disease without esophagitis: Secondary | ICD-10-CM | POA: Diagnosis not present

## 2024-07-04 DIAGNOSIS — I10 Essential (primary) hypertension: Secondary | ICD-10-CM | POA: Diagnosis not present

## 2024-07-11 ENCOUNTER — Inpatient Hospital Stay: Admitting: Family Medicine

## 2024-07-12 DIAGNOSIS — I10 Essential (primary) hypertension: Secondary | ICD-10-CM | POA: Diagnosis not present

## 2024-07-12 DIAGNOSIS — Z96611 Presence of right artificial shoulder joint: Secondary | ICD-10-CM | POA: Diagnosis not present

## 2024-07-12 DIAGNOSIS — Z9181 History of falling: Secondary | ICD-10-CM | POA: Diagnosis not present

## 2024-07-12 DIAGNOSIS — F17210 Nicotine dependence, cigarettes, uncomplicated: Secondary | ICD-10-CM | POA: Diagnosis not present

## 2024-07-12 DIAGNOSIS — K219 Gastro-esophageal reflux disease without esophagitis: Secondary | ICD-10-CM | POA: Diagnosis not present

## 2024-07-12 DIAGNOSIS — J449 Chronic obstructive pulmonary disease, unspecified: Secondary | ICD-10-CM | POA: Diagnosis not present

## 2024-07-12 DIAGNOSIS — F419 Anxiety disorder, unspecified: Secondary | ICD-10-CM | POA: Diagnosis not present

## 2024-07-12 DIAGNOSIS — S92062D Displaced intraarticular fracture of left calcaneus, subsequent encounter for fracture with routine healing: Secondary | ICD-10-CM | POA: Diagnosis not present

## 2024-07-12 DIAGNOSIS — E785 Hyperlipidemia, unspecified: Secondary | ICD-10-CM | POA: Diagnosis not present

## 2024-07-13 DIAGNOSIS — Z96611 Presence of right artificial shoulder joint: Secondary | ICD-10-CM | POA: Diagnosis not present

## 2024-07-13 DIAGNOSIS — S92062D Displaced intraarticular fracture of left calcaneus, subsequent encounter for fracture with routine healing: Secondary | ICD-10-CM | POA: Diagnosis not present

## 2024-07-13 DIAGNOSIS — K219 Gastro-esophageal reflux disease without esophagitis: Secondary | ICD-10-CM | POA: Diagnosis not present

## 2024-07-13 DIAGNOSIS — I10 Essential (primary) hypertension: Secondary | ICD-10-CM | POA: Diagnosis not present

## 2024-07-13 DIAGNOSIS — F17210 Nicotine dependence, cigarettes, uncomplicated: Secondary | ICD-10-CM | POA: Diagnosis not present

## 2024-07-13 DIAGNOSIS — Z9181 History of falling: Secondary | ICD-10-CM | POA: Diagnosis not present

## 2024-07-13 DIAGNOSIS — E785 Hyperlipidemia, unspecified: Secondary | ICD-10-CM | POA: Diagnosis not present

## 2024-07-13 DIAGNOSIS — F419 Anxiety disorder, unspecified: Secondary | ICD-10-CM | POA: Diagnosis not present

## 2024-07-13 DIAGNOSIS — J449 Chronic obstructive pulmonary disease, unspecified: Secondary | ICD-10-CM | POA: Diagnosis not present

## 2024-07-19 DIAGNOSIS — Z9181 History of falling: Secondary | ICD-10-CM | POA: Diagnosis not present

## 2024-07-19 DIAGNOSIS — E785 Hyperlipidemia, unspecified: Secondary | ICD-10-CM | POA: Diagnosis not present

## 2024-07-19 DIAGNOSIS — Z96611 Presence of right artificial shoulder joint: Secondary | ICD-10-CM | POA: Diagnosis not present

## 2024-07-19 DIAGNOSIS — S92062D Displaced intraarticular fracture of left calcaneus, subsequent encounter for fracture with routine healing: Secondary | ICD-10-CM | POA: Diagnosis not present

## 2024-07-19 DIAGNOSIS — K219 Gastro-esophageal reflux disease without esophagitis: Secondary | ICD-10-CM | POA: Diagnosis not present

## 2024-07-19 DIAGNOSIS — F419 Anxiety disorder, unspecified: Secondary | ICD-10-CM | POA: Diagnosis not present

## 2024-07-19 DIAGNOSIS — I10 Essential (primary) hypertension: Secondary | ICD-10-CM | POA: Diagnosis not present

## 2024-07-19 DIAGNOSIS — F17210 Nicotine dependence, cigarettes, uncomplicated: Secondary | ICD-10-CM | POA: Diagnosis not present

## 2024-07-19 DIAGNOSIS — J449 Chronic obstructive pulmonary disease, unspecified: Secondary | ICD-10-CM | POA: Diagnosis not present

## 2024-07-26 DIAGNOSIS — Z9181 History of falling: Secondary | ICD-10-CM | POA: Diagnosis not present

## 2024-07-26 DIAGNOSIS — F17210 Nicotine dependence, cigarettes, uncomplicated: Secondary | ICD-10-CM | POA: Diagnosis not present

## 2024-07-26 DIAGNOSIS — S92062D Displaced intraarticular fracture of left calcaneus, subsequent encounter for fracture with routine healing: Secondary | ICD-10-CM | POA: Diagnosis not present

## 2024-07-26 DIAGNOSIS — J449 Chronic obstructive pulmonary disease, unspecified: Secondary | ICD-10-CM | POA: Diagnosis not present

## 2024-07-26 DIAGNOSIS — E785 Hyperlipidemia, unspecified: Secondary | ICD-10-CM | POA: Diagnosis not present

## 2024-07-26 DIAGNOSIS — Z96611 Presence of right artificial shoulder joint: Secondary | ICD-10-CM | POA: Diagnosis not present

## 2024-07-26 DIAGNOSIS — I10 Essential (primary) hypertension: Secondary | ICD-10-CM | POA: Diagnosis not present

## 2024-07-26 DIAGNOSIS — F419 Anxiety disorder, unspecified: Secondary | ICD-10-CM | POA: Diagnosis not present

## 2024-07-26 DIAGNOSIS — K219 Gastro-esophageal reflux disease without esophagitis: Secondary | ICD-10-CM | POA: Diagnosis not present

## 2024-08-07 ENCOUNTER — Other Ambulatory Visit: Payer: Self-pay

## 2024-08-07 MED ORDER — ALPRAZOLAM 1 MG PO TABS: ORAL_TABLET | ORAL | 0 refills | Status: DC

## 2024-08-07 NOTE — Telephone Encounter (Signed)
 Pt needs to reschedule TOC with Adina Crandall or Dr Bennett soon. Thank you.

## 2024-08-07 NOTE — Telephone Encounter (Signed)
 Copied from CRM [id number]. Topic: Clinical - Medication Refill >> Aug 07, 2024  2:06 PM [place] wrote: Medication: ALPRAZolam  (XANAX ) 1 MG tablet  Has the patient contacted their pharmacy? Yes (Agent: If no, request that the patient contact the pharmacy for the refill. If patient does not wish to contact the pharmacy document the reason why and proceed with request.) (Agent: If yes, when and what did the pharmacy advise?)  This is the patient's preferred pharmacy:  [facility] - [place], [place] - [address] [place] [address] Phone: [phone] Fax: [phone]  Is this the correct pharmacy for this prescription? Yes If no, delete pharmacy and type the correct one.   Has the prescription been filled recently? Yes  Is the patient out of the medication? Yes  Has the patient been seen for an appointment in the last year OR does the patient have an upcoming appointment? Yes  Can we respond through MyChart? Yes  Agent: Please be advised that Rx refills may take up to 3 business days. We ask that you follow-up with your pharmacy.

## 2024-08-07 NOTE — Telephone Encounter (Signed)
 It was filled by [name] earlier in the afternoon today

## 2024-08-07 NOTE — Telephone Encounter (Signed)
 Copied from CRM #[id number]. Topic: Clinical - Prescription Issue >> Aug 07, 2024  2:07 PM [place] wrote: Reason for CRM: Patient needing to get ALPRAZolam  (XANAX ) 1 MG tablet, she has a TOC appointment on 09/29/2024 2:40 PM with Dr Bennett as that was the soonest.

## 2024-08-10 ENCOUNTER — Encounter: Admitting: Nurse Practitioner

## 2024-09-14 ENCOUNTER — Other Ambulatory Visit: Payer: Self-pay | Admitting: Family

## 2024-09-14 DIAGNOSIS — F1320 Sedative, hypnotic or anxiolytic dependence, uncomplicated: Secondary | ICD-10-CM

## 2024-09-19 NOTE — Progress Notes (Signed)
 [facility] Encounter Provider: Fonda LOISE Cooler, MD Date of Service: 09/18/2024   Primary Care Provider: Jimmy Charlie Scarlet, MD Referring Provider: Fonda Rosalynn Cooler   ICD-10-CM  1. Closed displaced fracture of body of left calcaneus with routine healing, subsequent encounter  S92.012D   Orthopaedic notes: Disabled CNA/LPN COPD, Anxiety, Tobacco Epilepsy BMI 18  Cervicalgia and bilateral hand paresthesia/weakness since 03/2022 Hx C4-C7 ACDF (OSH, 2017, left incision) XR-C 07/2022: Anterolisthesis C2-C3 & C3-C4.  Resorbed grafts.  Non-union with motion at C6-7. MR-C 09/2022: Mild foram stenosis C3-4, C7-T1.  T1-2 disc/osteophyte. ? C2-7/T2 PCDF - Pending smoking cessation Prognosis fair - VAS relief by 2   - right calcaneus fracture sustained on 05/28/2024, closed management initiated 06/20/2024 (Attending: [facility])   Global note  ASSESSMENT:  Appropriate progress - stable depressed calcaneus malunion, continued nicotine  use  PLAN:   Treatment for the right lower extremity. No wounds or sutures to address. May wear regular shoe.   Weightbearing as tolerated.   Assessment & Plan Displaced fracture of body of left calcaneus, healing The fracture is healing with reduced swelling and some improvement since early October. Persistent discomfort and swelling occur with increased activity. Nicotine  use may impede healing and limit surgical options. Surgical intervention, such as joint fusion, may be considered if symptoms persist beyond three to four months, contingent on cessation of nicotine  use. - Advised cessation of nicotine  use to enhance healing and open surgical options. - Monitor symptoms month-to-month for improvement over the next three to four months. - Will consider surgical intervention, such as joint fusion, if symptoms persist beyond three to four months and nicotine  cessation is achieved.    Scheduling Notes: Planned No  follow-ups on file.; Scheduled Visit date not found - Xrays next visit: none     Requested Prescriptions    No prescriptions requested or ordered in this encounter    Orders Placed This Encounter  Procedures  . XR Calcaneus/Heel Left     History: Chief complaint: No chief complaint on file.  Global followup   The encounter diagnosis was Closed displaced fracture of body of left calcaneus with routine healing, subsequent encounter. HPI: 63 y.o. female    History of Present Illness Molly Peters is a 63 year old female who presents with ongoing ankle/hindfoot pain and swelling following a previous injury.  She experiences persistent soreness around the ankle and behind it, with swelling that worsens with activity. She needs to slide her foot sideways and go up and down steps sideways due to discomfort. The swelling has improved compared to a month ago, but she still experiences significant discomfort, especially after overexerting herself with activities like cleaning, which leads to increased swelling and difficulty walking.  She manages the pain by keeping her foot under an electric blanket, as icing exacerbates the pain. She has a history of nicotine  use.  She is concerned about insurance coverage changes, as [employer] will no longer cover her current provider, and she is considering her options for future care. She resides in [place] and has been experiencing issues with her MyChart access due to a phone change.  Exam: The encounter diagnosis was Closed displaced fracture of body of left calcaneus with routine healing, subsequent encounter.  No acute distress; alert and cooperative. Musculoskeletal Skin is benign.  Grossly well aligned.     Test Results The encounter diagnosis was Closed displaced fracture of body of left calcaneus with routine healing, subsequent encounter. Imaging Radiology studies were  ordered and personally reviewed and interpreted by the encounter  provider today.. Stable alignment, evidence of healing.     DME ORDER: Dx:  ,

## 2024-09-20 NOTE — Telephone Encounter (Signed)
 Pt called in for an update because the pharmacy requested for a refill on 11/6. She has 1 pill left. Please advise.

## 2024-09-21 ENCOUNTER — Other Ambulatory Visit: Payer: Self-pay

## 2024-09-21 MED ORDER — LOSARTAN POTASSIUM-HCTZ 100-12.5 MG PO TABS: 1.0000 | ORAL_TABLET | Freq: Every day | ORAL | 0 refills | Status: AC

## 2024-09-21 MED ORDER — PAROXETINE HCL 20 MG PO TABS: 20.0000 mg | ORAL_TABLET | Freq: Every day | ORAL | 0 refills | Status: DC

## 2024-09-21 NOTE — Telephone Encounter (Signed)
 Pls call and update Rx sent

## 2024-09-21 NOTE — Telephone Encounter (Signed)
 Copied from CRM [id number]. Topic: Clinical - Medication Question >> Sep 20, 2024  4:28 PM Aisha D wrote: Reason for CRM: Pt stated that she has been calling since last week in regards to the ALPRAZolam  (XANAX ) 1 MG tablet. Pt stated that she has one left and will be out of the medication tomorrow. Pt would like for this medication to be approved and sent to the pharmacy as soon as possible and would like a callback with an update.

## 2024-09-21 NOTE — Telephone Encounter (Signed)
 Would you be willing to refill this? Pt has an appointment with you on 09/29/24. We have sent this request to Padonda several times with no response. Thank you!

## 2024-09-22 NOTE — Telephone Encounter (Signed)
 Spoke to pt. She got it late yesterday afternoon.

## 2024-09-29 ENCOUNTER — Ambulatory Visit

## 2024-09-29 VITALS — BP 138/86 | HR 79 | Temp 98.1°F | Ht 60.0 in | Wt 86.0 lb

## 2024-09-29 DIAGNOSIS — R519 Headache, unspecified: Secondary | ICD-10-CM

## 2024-09-29 DIAGNOSIS — Z136 Encounter for screening for cardiovascular disorders: Secondary | ICD-10-CM

## 2024-09-29 DIAGNOSIS — F1320 Sedative, hypnotic or anxiolytic dependence, uncomplicated: Secondary | ICD-10-CM | POA: Diagnosis not present

## 2024-09-29 DIAGNOSIS — Z113 Encounter for screening for infections with a predominantly sexual mode of transmission: Secondary | ICD-10-CM

## 2024-09-29 DIAGNOSIS — F419 Anxiety disorder, unspecified: Secondary | ICD-10-CM | POA: Diagnosis not present

## 2024-09-29 DIAGNOSIS — E538 Deficiency of other specified B group vitamins: Secondary | ICD-10-CM | POA: Diagnosis not present

## 2024-09-29 DIAGNOSIS — K219 Gastro-esophageal reflux disease without esophagitis: Secondary | ICD-10-CM

## 2024-09-29 DIAGNOSIS — D500 Iron deficiency anemia secondary to blood loss (chronic): Secondary | ICD-10-CM

## 2024-09-29 DIAGNOSIS — F17200 Nicotine dependence, unspecified, uncomplicated: Secondary | ICD-10-CM

## 2024-09-29 DIAGNOSIS — K8681 Exocrine pancreatic insufficiency: Secondary | ICD-10-CM | POA: Diagnosis not present

## 2024-09-29 DIAGNOSIS — G40909 Epilepsy, unspecified, not intractable, without status epilepticus: Secondary | ICD-10-CM

## 2024-09-29 MED ORDER — ALPRAZOLAM 1 MG PO TABS: ORAL_TABLET | ORAL | Status: DC

## 2024-09-29 MED ORDER — PAROXETINE HCL 30 MG PO TABS: 30.0000 mg | ORAL_TABLET | Freq: Every day | ORAL | 0 refills | Status: DC

## 2024-09-29 MED ORDER — PANTOPRAZOLE SODIUM 40 MG PO TBEC: 40.0000 mg | DELAYED_RELEASE_TABLET | Freq: Every day | ORAL | Status: DC

## 2024-09-29 MED ORDER — ONDANSETRON 4 MG PO TBDP: ORAL_TABLET | ORAL | 0 refills | Status: AC

## 2024-09-29 NOTE — Patient Instructions (Signed)
 Thank you for visiting [place] [facility] today! Here's what we talked about: - START Using ibuprofen for headaches - Start Paxil  30mg 

## 2024-09-29 NOTE — Progress Notes (Unsigned)
 Subjective:   This visit was conducted in person. The patient gave informed consent to the use of Abridge AI technology to record the contents of the encounter as documented below.   Patient ID: Molly Peters, female    DOB: [birth year], 63 y.o.   MRN: [id number]   Discussed the use of AI scribe software for clinical note transcription with the patient, who gave verbal consent to proceed.  History of Present Illness Molly Peters is a 63 year old female with COPD, anxiety, and pancreatic enzyme insufficiency who presents for medication management and follow-up.  She has a history of COPD and currently uses albuterol . She has been smoking since age 59 and currently smokes about half a pack per day, with increased smoking during periods of anxiety.  She experiences restless legs at night, disrupting her sleep. She has a history of low iron  and previously required transfusions and shots due to significant bleeding, though she no longer sees blood in her stool.  She mentions a past issue with her pancreas related to enzyme production, causing nausea, especially after eating certain foods. She was previously on Creon  but stopped due to cost.  She has a history of anxiety and depression, taking Paxil  20 mg daily. She also uses Xanax , taking one tablet in the morning and one at night, to manage anxiety and panic attacks, which are triggered by crowded places. She has been on Xanax  since age 12 and reports dependence on it.  She has a history of seizures and is on Lamictal , with no seizures in 20 years. She also reports chronic back pain, having undergone three back surgeries and a neck fusion. She experiences intermittent headaches at the back of her head, described as sharp, needle-like pains that occur mostly at night and wake her from sleep. These headaches have been ongoing for several months, occurring a few times a week.  She has a history of high blood pressure and low B12, for which  she previously received injections. She also reports acid reflux and a history of a stomach ulcer, with intermittent stomach pain. She takes Protonix  once daily for this.  She experiences nausea, sometimes unrelated to eating, and uses Zofran  as needed. She hides her medications due to concerns about her children taking them.  She fractured her ankle about three months ago. The injury caused significant swelling and bruising, and she describes severe pain at the time of the fracture.    Review of Systems  All other systems reviewed and are negative.       Allergies  Allergen Reactions   Carbamazepine Hives   Divalproex Sodium Swelling and Other (See Comments)    HAIR FALLS OUT   Clonazepam Other (See Comments)    HALLUCINATIONS   Diphenhydramine Hcl Rash   Tiotropium Bromide Monohydrate  Other (See Comments)    CREATES YEAST IN THROAT   Vicodin [Hydrocodone-Acetaminophen ] Itching    Current Outpatient Medications on File Prior to Visit  Medication Sig Dispense Refill   albuterol  (VENTOLIN  HFA) 108 (90 Base) MCG/ACT inhaler INHALE 2 PUFFS BY MOUTH EVERY 6 HOURS ASNEEDED WHEEEZING/ SHORTNESS OF BREATH 8.5 g 1   lamoTRIgine  (LAMICTAL ) 100 MG tablet TAKE 1 TABLET BY MOUTH TWICE DAILY 180 tablet 3   losartan -hydrochlorothiazide  (HYZAAR) 100-12.5 MG tablet Take 1 tablet by mouth daily. 90 tablet 0   No current facility-administered medications on file prior to visit.    BP 138/86 (BP Location: Left Arm, Patient Position: Sitting, Cuff Size: Normal)  Pulse 79   Temp 98.1 F (36.7 C) (Oral)   Ht 5' (1.524 m)   Wt 86 lb (39 kg)   SpO2 96%   BMI 16.80 kg/m   Objective:      Physical Exam GENERAL: Alert, cooperative, well developed, no acute distress. Underweight HEAD: Normocephalic atraumatic. EYES: Extraocular movements intact BL, pupils round, equal and reactive to light BL, conjunctivae normal BL. EXTREMITIES: No cyanosis or edema. NEUROLOGICAL: Oriented to person,  place and time, no gait abnormalities, moves all extremities without gross motor or sensory deficit.         Assessment & Plan:   Assessment & Plan Anxiety disorder and depression Anxiety and depression managed with Paxil . Anxiety exacerbated by crowds and social situations. Plan to increase Paxil  to improve anxiety control and reduce Xanax  dependence.  - Increased Paxil  to 30 mg daily. - Will monitor response to increased Paxil  dosage.  Sedative (benzodiazepine) dependence Long-term Xanax  use with dependence and withdrawal symptoms. Discussed risks of Xanax , including respiratory and cardiac depression. Plan to gradually reduce Xanax  dosage while increasing Paxil  to manage anxiety.  - Increased Paxil  to 30 mg daily. - Continued Xanax  at current dose, 1mg  BID with plan to gradually reduce in future visits. - Had patient sign controlled substance use agreement. - Unable to give urine drug screen this time  Exocrine pancreatic insufficiency Symptoms of nausea and malabsorption likely due to self discontinuation of Creon .  - Prescribed Creon  for pancreatic enzyme replacement therapy. -Sent Zofran  prescription as needed  Iron  deficiency  Hx of Iron  deficiency without current supplementation. No recent lab work to assess current status.  - Ordered fasting iron  level.  Vitamin B12 deficiency (monitoring) Vitamin B12 deficiency with previous supplementation. Last B12 level checked a year ago was WNL. - Ordered fasting B12 level.  Seizure disorder  Seizure disorder well-controlled on Lamictal  for 20 years with no recent seizures. - Ordered Lamictal  level.  Chronic left occipital headache Intermittent left occipital headaches suggestive of tension-type headache.  However, patient does have a history of cervical spinal fusion in the past, this could be cervicogenic headache, will see how it responds to NSAID.  - Recommended ibuprofen 600-800 mg for headache relief when waking at  night  General heath maintenance Age-appropriate lipid panel, HIV and hep C ordered in preparation for upcoming physical.    Return in about 3 weeks (around 10/20/2024) for CPE fasting labs 1 weeks. Discuss COPD and smoking cessation as well  Nyashia Raney K Wenonah Milo, MD  09/29/24     Contains text generated by Abridge.

## 2024-10-01 MED ORDER — PANCRELIPASE (LIP-PROT-AMYL) 12000-38000 UNITS PO CPEP: 24000.0000 [IU] | ORAL_CAPSULE | Freq: Three times a day (TID) | ORAL | 0 refills | Status: DC

## 2024-10-09 ENCOUNTER — Telehealth: Payer: Self-pay

## 2024-10-09 NOTE — Telephone Encounter (Signed)
 Copied from CRM #[id number]. Topic: Clinical - Medication Question >> Oct 09, 2024  9:04 AM Revonda D wrote: Reason for CRM: Pt wants to see if she can get an alternative medication for the lipase/protease/amylase (CREON ) 12000-38000 units CPEP capsule. Pt stated that the co pay is too expensive and she is not able to get the medication. Pt would appreciate a callback with an update.

## 2024-10-11 ENCOUNTER — Encounter: Payer: Self-pay | Admitting: Pharmacist

## 2024-10-11 NOTE — Telephone Encounter (Signed)
 Hi, can you advise on more affordable alternative based on pt's insurance?

## 2024-10-11 NOTE — Progress Notes (Unsigned)
   10/16/2024 Name: Molly Peters MRN: [id number] DOB: 12/16/1960  Subjective  Chief Complaint  Patient presents with   Medication Access    Care Team: Primary Care Provider: Jimmy Charlie FERNS, MD > TOC Dr. Bennett  Reason for Documentation:?  Pt wants to see if she can get an alternative medication for the lipase/protease/amylase (CREON ) 12000-38000 units CPEP capsule. Pt stated that the co pay is too expensive and she is not able to get the medication. Pt would appreciate a callback with an update.    Prescription drug coverage: YES Payor: [employer] MEDICARE / Plan: [employer] MEDICARE CHOICE PPO / Product Type: *No Product type* / .  Plan ID: [id number] Summary of Benefit (2026):  [email or web address] $450 deductible (Tier 3+) Tier 3 (preferred brand) = $47/month after deductible   Current Patient Assistance: N/A (previously eligible/enrolled in PAP)   Patient lives in a household of 2 with income via SS disability.   Medicare LIS Eligible: Possibly. She will discuss with husband to get a general estimate/idea of where their combined income/savings lies.   Creon  PAP  2025 Poverty Guidelines  Family Size  400%   1  $62,600  2  $84,600  3  $106,600  4  $128,600  Programs [employer] (Creon )   Assessment and Plan:   1. Medication Access Medicare: Creon  = $47/month after deductible each year though her deductible remains a barrier. Has not taken Creon  because of this.  Grants: None available through [employer] or [employer].  PAP: [employer] (400% FPL) which patient feels she will qualify for. Based on her estimated income, she will likely need to apply for Medicare LIS. Patient plans to check with her husband to confirm combined income/savings and will call back.  Based on income, will assist patient with either Medicare LIS or [employer] PAP application.   Follow Up: Patient given pharmacist's direct line.  [phone] Reminder set to check in with patient in 2 weeks if she has not reached out at that time.   Future Appointments  Date Time Provider Department Center  11/20/2024  3:40 PM [facility] ANNUAL WELLNESS VISIT 2 [facility] [address]  11/28/2024  8:30 AM [facility] LAB [facility] [address]  12/05/2024  8:40 AM Bowa, Reuben POUR, MD [facility] [address]   Manuelita FABIENE Kobs, PharmD Clinical Pharmacist [facility] [phone]

## 2024-10-12 NOTE — Telephone Encounter (Signed)
 Thank you! Pls keep me posted once its all sorted!

## 2024-10-13 ENCOUNTER — Telehealth: Payer: Self-pay

## 2024-10-13 NOTE — Telephone Encounter (Signed)
 Spoke to pt. She said she received a VM from someone about one of her medications. She did not write down the name or number. There is another message about her Creon  open.

## 2024-10-13 NOTE — Telephone Encounter (Signed)
 Spoke to pt. She did not write down the number to call and did not know who called.

## 2024-10-13 NOTE — Telephone Encounter (Signed)
 Copied from CRM #[id number]. Topic: General - Call Back - No Documentation >> Oct 13, 2024 11:58 AM Molly Peters wrote: Reason for CRM: Patient calling requesting a call back from the nurse, patient can be reached at [phone]. Patient has questions about her medication.

## 2024-10-13 NOTE — Telephone Encounter (Signed)
 Left message to call office.

## 2024-10-13 NOTE — Telephone Encounter (Signed)
 Copied from CRM #[id number]. Topic: General - Call Back - No Documentation >> Oct 13, 2024 11:58 AM Mercedes MATSU wrote: Reason for CRM: Patient calling requesting a call back from the nurse, patient can be reached at [phone]. Patient has questions about her medication.

## 2024-10-31 ENCOUNTER — Other Ambulatory Visit: Payer: Self-pay

## 2024-10-31 DIAGNOSIS — F1320 Sedative, hypnotic or anxiolytic dependence, uncomplicated: Secondary | ICD-10-CM

## 2024-10-31 DIAGNOSIS — F419 Anxiety disorder, unspecified: Secondary | ICD-10-CM

## 2024-11-03 ENCOUNTER — Other Ambulatory Visit: Payer: Self-pay

## 2024-11-03 DIAGNOSIS — F419 Anxiety disorder, unspecified: Secondary | ICD-10-CM

## 2024-11-20 ENCOUNTER — Telehealth: Payer: Self-pay

## 2024-11-20 ENCOUNTER — Ambulatory Visit

## 2024-11-20 VITALS — BP 138/86 | Ht 60.0 in | Wt 86.0 lb

## 2024-11-20 DIAGNOSIS — Z0000 Encounter for general adult medical examination without abnormal findings: Secondary | ICD-10-CM

## 2024-11-20 NOTE — Progress Notes (Signed)
 " I connected with  Molly Peters on 11/20/2024 by a audio enabled telemedicine application and verified that I am speaking with the correct person using two identifiers.  Patient Location: Home  Provider Location: Home Office  Persons Participating in Visit: Patient.  I discussed the limitations of evaluation and management by telemedicine. The patient expressed understanding and agreed to proceed.  Vital Signs: Because this visit was a virtual/telehealth visit, some criteria may be missing or patient reported. Any vitals not documented were not able to be obtained and vitals that have been documented are patient reported.  Chief Complaint  Patient presents with   Medicare Wellness     Subjective:   Molly Peters is a 64 y.o. female who presents for a Medicare Annual Wellness Visit.  Visit info / Clinical Intake: Medicare Wellness Visit Type:: Subsequent Annual Wellness Visit Persons participating in visit and providing information:: patient Medicare Wellness Visit Mode:: Telephone If telephone:: video declined Since this visit was completed virtually, some vitals may be partially provided or unavailable. Missing vitals are due to the limitations of the virtual format.: Documented vitals are patient reported If Telephone or Video please confirm:: I connected with patient using audio/video enable telemedicine. I verified patient identity with two identifiers, discussed telehealth limitations, and patient agreed to proceed. Patient Location:: home Provider Location:: home office Interpreter Needed?: No Pre-visit prep was completed: yes AWV questionnaire completed by patient prior to visit?: no Living arrangements:: lives with spouse/significant other Patient's Overall Health Status Rating: very good Typical amount of pain: some Does pain affect daily life?: no Are you currently prescribed opioids?: no  Dietary Habits and Nutritional Risks How many meals a day?: 2 Eats  fruit and vegetables daily?: yes Most meals are obtained by: preparing own meals In the last 2 weeks, have you had any of the following?: none  Functional Status Activities of Daily Living (to include ambulation/medication): Independent Ambulation: Independent with device- listed below Home Assistive Devices/Equipment: Walker (specify Type) Medication Administration: Independent Home Management (perform basic housework or laundry): Independent Manage your own finances?: yes Primary transportation is: driving Concerns about vision?: no *vision screening is required for WTM* Concerns about hearing?: no  Fall Screening Falls in the past year?: 1 Number of falls in past year: 1 Was there an injury with Fall?: 1 Fall Risk Category Calculator: 3 Patient Fall Risk Level: High Fall Risk  Fall Risk Patient at Risk for Falls Due to: History of fall(s); Impaired balance/gait; Impaired mobility Fall risk Follow up: Falls evaluation completed  Home and Transportation Safety: All rugs have non-skid backing?: yes All stairs or steps have railings?: yes Grab bars in the bathtub or shower?: yes Have non-skid surface in bathtub or shower?: yes Good home lighting?: yes Regular seat belt use?: yes Hospital stays in the last year:: (!) yes How many hospital stays:: 1 Reason: fall  Cognitive Assessment Difficulty concentrating, remembering, or making decisions? : yes Will 6CIT or Mini Cog be Completed: yes What year is it?: 0 points What month is it?: 0 points Give patient an address phrase to remember (5 components): remember words apple, table ,penny About what time is it?: 0 points Count backwards from 20 to 1: 0 points Say the months of the year in reverse: 0 points Repeat the address phrase from earlier: 0 points 6 CIT Score: 0 points  Advance Directives (For Healthcare) Does Patient Have a Medical Advance Directive?: Yes Does patient want to make changes to medical advance  directive?: No -  Patient declined Type of Advance Directive: Healthcare Power of [place]; Living will; Out of facility DNR (pink MOST or yellow form) Copy of Healthcare Power of Attorney in Chart?: Yes - validated most recent copy scanned in chart (See row information) Copy of Living Will in Chart?: Yes - validated most recent copy scanned in chart (See row information) Out of facility DNR (pink MOST or yellow form) in Chart? (Ambulatory ONLY): No - copy requested  Reviewed/Updated  Reviewed/Updated: Reviewed All (Medical, Surgical, Family, Medications, Allergies, Care Teams, Patient Goals)    Allergies (verified) Carbamazepine, Divalproex sodium, Clonazepam, Diphenhydramine hcl, Tiotropium bromide monohydrate , and Vicodin [hydrocodone-acetaminophen ]   Current Medications (verified) Outpatient Encounter Medications as of 11/20/2024  Medication Sig   albuterol  (VENTOLIN  HFA) 108 (90 Base) MCG/ACT inhaler INHALE 2 PUFFS BY MOUTH EVERY 6 HOURS ASNEEDED WHEEEZING/ SHORTNESS OF BREATH   ALPRAZolam  (XANAX ) 1 MG tablet Take 1 tablet (1 mg total) by mouth 2 (two) times daily as needed for anxiety. TAKE 1/2 - 1 TABLET BY MOUTH 3 TIMES DAILY AS NEEDED   lamoTRIgine  (LAMICTAL ) 100 MG tablet TAKE 1 TABLET BY MOUTH TWICE DAILY   lipase/protease/amylase (CREON ) 12000-38000 units CPEP capsule Take 2 capsules (24,000 Units total) by mouth 3 (three) times daily with meals. Take 1 capsule with snacks   losartan -hydrochlorothiazide  (HYZAAR) 100-12.5 MG tablet Take 1 tablet by mouth daily.   ondansetron  (ZOFRAN -ODT) 4 MG disintegrating tablet DISSOLVE 1 TABLET ON THE TONGUE EVERY 8 HOURS AS NEEDED   pantoprazole  (PROTONIX ) 40 MG tablet Take 1 tablet (40 mg total) by mouth daily.   PARoxetine  (PAXIL ) 30 MG tablet Take 1 tablet (30 mg total) by mouth daily.   No facility-administered encounter medications on file as of 11/20/2024.    History: Past Medical History:  Diagnosis Date   Anxiety    Cervical  cancer (HCC) 1980's   Chronic back pain 11/12/2009   Qualifier: Diagnosis of  By: Baird FNP, Frieda Kipper    COPD (chronic obstructive pulmonary disease) (HCC)    states SOB with ADLs; no O2 use; able to speak in complete sentences without SOB(11/15/2014)   Depression    Difficulty swallowing pills    s/p cervical fusion   Family history of adverse reaction to anesthesia    pt's mother and sister have hx. of post-op N/V   Fibromyalgia    GERD (gastroesophageal reflux disease)    Heart murmur    History of gastric ulcer    Hypertension    states under control with med., has been on med. x 1-2 yr.   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  Echo (10/2009) showed EF 60-65%,      normal LV size, moderate aortic insufficiency with a trileaflet aortic valve and normal aortic root size.  Mild      mitral regurgitation.    Wears dentures    lower   Past Surgical  History:  Procedure Laterality Date   ABDOMINAL HYSTERECTOMY     partial   ANTERIOR CERVICAL DECOMP/DISCECTOMY FUSION  02/06/2011   C4-5, C5-6, C6-7   BREAST BIOPSY Bilateral 10+ yrs ago   neg   BREAST BIOPSY Right 08/03/2018   2 areas bx, -neg   BREAST LUMPECTOMY Bilateral 1989   x 3 - benign   BREAST LUMPECTOMY Right 08/24/2018   Procedure: BREAST LUMPECTOMY WITH ULTRASOUND IN OR;  Surgeon: Nicholaus Selinda Birmingham, MD;  Location: [facility];  Service: General;  Laterality: Right;   CARDIAC CATHETERIZATION  1995    CESAREAN SECTION     x 2   COLONOSCOPY  09/28/2008   COLONOSCOPY N/A 06/15/2018   Procedure: COLONOSCOPY;  Surgeon: Janalyn Keene NOVAK, MD;  Location: [facility];  Service: Endoscopy;  Laterality: N/A;   COLONOSCOPY WITH PROPOFOL  N/A 05/04/2019   Procedure: COLONOSCOPY WITH PROPOFOL ;  Surgeon: Janalyn Keene NOVAK, MD;  Location: [facility];  Service: Endoscopy;  Laterality: N/A;   COLONOSCOPY WITH PROPOFOL  N/A 04/21/2021   Procedure: COLONOSCOPY WITH PROPOFOL ;  Surgeon: Janalyn Keene NOVAK, MD;  Location: [facility];  Service: Endoscopy;  Laterality: N/A;   ESOPHAGOGASTRODUODENOSCOPY  09/28/2008   ESOPHAGOGASTRODUODENOSCOPY N/A 06/14/2018   Procedure: ESOPHAGOGASTRODUODENOSCOPY (EGD);  Surgeon: Janalyn Keene NOVAK, MD;  Location: [facility];  Service: Endoscopy;  Laterality: N/A;   ESOPHAGOGASTRODUODENOSCOPY (EGD) WITH PROPOFOL  N/A 05/04/2019   Procedure: ESOPHAGOGASTRODUODENOSCOPY (EGD) WITH PROPOFOL ;  Surgeon: Janalyn Keene NOVAK, MD;  Location: [facility];  Service: Endoscopy;  Laterality: N/A;   ESOPHAGOGASTRODUODENOSCOPY (EGD) WITH PROPOFOL  N/A 03/18/2020   Procedure: ESOPHAGOGASTRODUODENOSCOPY (EGD) WITH PROPOFOL ;  Surgeon: Unk Corinn Skiff, MD;  Location: [facility];  Service: Gastroenterology;  Laterality: N/A;   ESOPHAGOGASTRODUODENOSCOPY (EGD) WITH PROPOFOL  N/A 04/21/2021   Procedure: ESOPHAGOGASTRODUODENOSCOPY (EGD) WITH PROPOFOL ;  Surgeon: Janalyn Keene NOVAK, MD;  Location: [facility];  Service: Endoscopy;  Laterality: N/A;   HARDWARE REMOVAL  11/17/2006   L5-S1   LAMINECTOMY WITH POSTERIOR LATERAL ARTHRODESIS LEVEL 3  12/06/2009   with synovial cyst resection L3-4 bilat.   LAMINECTOMY WITH POSTERIOR LATERAL ARTHRODESIS LEVEL 3  11/17/2006   L4-5   NM MYOVIEW  LTD     Lexiscan myoview  (10/2009): EF 77%, normal wall motion, normal perfusion.    SHOULDER ARTHROSCOPY WITH DEBRIDEMENT AND BICEP TENDON REPAIR Right 04/13/2014   Procedure: RIGHT SHOULDER  ARTHROSCOPY WITH DEBRIDEMENT EXTENSIVE;  Surgeon: Fonda SHAUNNA Olmsted, MD;  Location: [facility];  Service: Orthopedics;  Laterality: Right;   TOTAL SHOULDER ARTHROPLASTY Right 11/23/2014   Procedure: TOTAL RIGHT SHOULDER ARTHROPLASTY;  Surgeon: Fonda SHAUNNA Olmsted, MD;  Location: [facility];  Service: Orthopedics;  Laterality: Right;   TOTAL SHOULDER ARTHROPLASTY Left 07/05/2015   Procedure: LEFT TOTAL SHOULDER REPLACEMENT;  Surgeon: Fonda Olmsted, MD;  Location:  SURGERY CENTER;  Service: Orthopedics;  Laterality: Left;   Family History  Problem Relation Age of Onset   Cervical cancer Mother    Anesthesia problems Mother        post-op N/V   Rheum arthritis Mother    Anxiety disorder Mother    Cancer Father        colon cancer   Migraines Sister    Cervical cancer Sister    Anesthesia problems Sister        post-op N/V   Migraines Brother    Asthma Daughter    Cerebral palsy Daughter        age 71   Breast cancer Maternal Aunt  Breast cancer Maternal Aunt    Breast cancer Maternal Aunt    Colon cancer Paternal Uncle    Lung cancer Paternal Uncle    Coronary artery disease Maternal Grandmother    Hypertension Maternal Grandmother    Diabetes Maternal Grandmother    Coronary artery disease Paternal Grandmother    Hypertension Paternal Grandmother    Diabetes Paternal Grandmother    Social History   Occupational History   Occupation: Disabled 2003  Tobacco Use   Smoking status: Every Day    Current packs/day: 1.00    Average packs/day: 1 pack/day for 42.8 years (42.8 ttl pk-yrs)    Types: Cigarettes    Passive exposure: Never   Smokeless tobacco: Never  Vaping Use   Vaping status: Never Used  Substance and Sexual Activity   Alcohol use: Yes    Alcohol/week: 0.0 standard drinks of alcohol    Comment: rare   Drug use: Yes    Types: Marijuana    Comment: marijuana 2 days ago   Sexual activity: Not on file   Tobacco Counseling Ready  to quit: Not Answered Counseling given: Not Answered  SDOH Screenings   Food Insecurity: No Food Insecurity (11/20/2024)  Housing: Low Risk (11/20/2024)  Transportation Needs: No Transportation Needs (11/20/2024)  Utilities: Not At Risk (11/20/2024)  Depression (PHQ2-9): Low Risk (11/20/2024)  Financial Resource Strain: Medium Risk (04/10/2022)  Physical Activity: Patient Declined (11/20/2024)  Social Connections: Moderately Integrated (11/20/2024)  Stress: No Stress Concern Present (11/20/2024)  Tobacco Use: High Risk (11/20/2024)  Health Literacy: Adequate Health Literacy (11/20/2024)   See flowsheets for full screening details  Depression Screen PHQ 2 & 9 Depression Scale- Over the past 2 weeks, how often have you been bothered by any of the following problems? Little interest or pleasure in doing things: 0 Feeling down, depressed, or hopeless (PHQ Adolescent also includes...irritable): 0 PHQ-2 Total Score: 0 Trouble falling or staying asleep, or sleeping too much: 2 Feeling tired or having little energy: 0 Poor appetite or overeating (PHQ Adolescent also includes...weight loss): 0 Feeling bad about yourself - or that you are a failure or have let yourself or your family down: 0 Trouble concentrating on things, such as reading the newspaper or watching television (PHQ Adolescent also includes...like school work): 0 Moving or speaking so slowly that other people could have noticed. Or the opposite - being so fidgety or restless that you have been moving around a lot more than usual: 0 Thoughts that you would be better off dead, or of hurting yourself in some way: 0 PHQ-9 Total Score: 2 If you checked off any problems, how difficult have these problems made it for you to do your work, take care of things at home, or get along with other people?: Not difficult at all  Depression Treatment Depression Interventions/Treatment : EYV7-0 Score <4 Follow-up Not Indicated     Goals Addressed              This Visit's Progress    Patient Stated       To try to get joints moving              Objective:    Today's Vitals   11/20/24 1607  BP: 138/86  Weight: 86 lb (39 kg)  Height: 5' (1.524 m)   Body mass index is 16.8 kg/m.  Hearing/Vision screen Hearing Screening - Comments:: No difficulties  Vision Screening - Comments:: Patient wears glasses  Immunizations and Health Maintenance Health Maintenance  Topic  Date Due   Hepatitis C Screening  Never done   Zoster Vaccines- Shingrix (1 of 2) Never done   DTaP/Tdap/Td (2 - Tdap) 01/08/2013   Lung Cancer Screening  02/27/2022   Pneumococcal Vaccine: 50+ Years (3 of 3 - PCV20 or PCV21) 10/27/2022   Medicare Annual Wellness (AWV)  09/22/2024   Influenza Vaccine  02/06/2025 (Originally 06/09/2024)   Cervical Cancer Screening (HPV/Pap Cotest)  09/29/2025 (Originally 12/04/1989)   Mammogram  03/09/2025   Colonoscopy  04/21/2028   HIV Screening  Completed   Hepatitis B Vaccines 19-59 Average Risk  Aged Out   HPV VACCINES  Aged Out   Meningococcal B Vaccine  Aged Out   COVID-19 Vaccine  Discontinued        Assessment/Plan:  This is a routine wellness examination for Molly Peters.  Patient Care Team: Bennett Reuben POUR, MD as PCP - General (Family Medicine) Janalyn Keene NOVAK, MD (Inactive) as Consulting Physician (Gastroenterology) Nicholaus Selinda Birmingham, MD (Inactive) as Consulting Physician (General Surgery) Melanee Annah BROCKS, MD as Consulting Physician (Oncology)  I have personally reviewed and noted the following in the patients chart:   Medical and social history Use of alcohol, tobacco or illicit drugs  Current medications and supplements including opioid prescriptions. Functional ability and status Nutritional status Physical activity Advanced directives List of other physicians Hospitalizations, surgeries, and ER visits in previous 12 months Vitals Screenings to include cognitive, depression, and falls Referrals  and appointments  No orders of the defined types were placed in this encounter.  In addition, I have reviewed and discussed with patient certain preventive protocols, quality metrics, and best practice recommendations. A written personalized care plan for preventive services as well as general preventive health recommendations were provided to patient.   Lyle POUR Right, [place]   11/20/2024   No follow-ups on file.  After Visit Summary: (MyChart) Due to this being a telephonic visit, the after visit summary with patients personalized plan was offered to patient via MyChart   No voiced or noted concerns at this time Vaccines not given: shngles, pneumonia,tdap  declined today HM Addressed: hep c screening declined   "

## 2024-11-20 NOTE — Patient Instructions (Signed)
 Molly Peters,  Thank you for taking the time for your Medicare Wellness Visit. I appreciate your continued commitment to your health goals. Please review the care plan we discussed, and feel free to reach out if I can assist you further.  Please note that Annual Wellness Visits do not include a physical exam. Some assessments may be limited, especially if the visit was conducted virtually. If needed, we may recommend an in-person follow-up with your provider.  Ongoing Care Seeing your primary care provider every 3 to 6 months helps us  monitor your health and provide consistent, personalized care.   Referrals If a referral was made during today's visit and you haven't received any updates within two weeks, please contact the referred provider directly to check on the status.  Recommended Screenings:  Health Maintenance  Topic Date Due   Hepatitis C Screening  Never done   Zoster (Shingles) Vaccine (1 of 2) Never done   DTaP/Tdap/Td vaccine (2 - Tdap) 01/08/2013   Screening for Lung Cancer  02/27/2022   Pneumococcal Vaccine for age over 89 (3 of 3 - PCV20 or PCV21) 10/27/2022   Medicare Annual Wellness Visit  09/22/2024   Flu Shot  02/06/2025*   Pap with HPV screening  09/29/2025*   Breast Cancer Screening  03/09/2025   Colon Cancer Screening  04/21/2028   HIV Screening  Completed   Hepatitis B Vaccine  Aged Out   HPV Vaccine  Aged Out   Meningitis B Vaccine  Aged Out   COVID-19 Vaccine  Discontinued  *Topic was postponed. The date shown is not the original due date.       11/20/2024    4:08 PM  Advanced Directives  Does Patient Have a Medical Advance Directive? Yes  Type of Estate Agent of [place];Living will;Out of facility DNR (pink MOST or yellow form)  Does patient want to make changes to medical advance directive? No - Patient declined  Copy of Healthcare Power of Attorney in Chart? Yes - validated most recent copy scanned in chart (See row  information)    Vision: Annual vision screenings are recommended for early detection of glaucoma, cataracts, and diabetic retinopathy. These exams can also reveal signs of chronic conditions such as diabetes and high blood pressure.  Dental: Annual dental screenings help detect early signs of oral cancer, gum disease, and other conditions linked to overall health, including heart disease and diabetes.  Please see the attached documents for additional preventive care recommendations.

## 2024-11-20 NOTE — Telephone Encounter (Unsigned)
 Copied from CRM [id number]. Topic: Appointments - Appointment Scheduling >> Nov 20, 2024  3:57 PM Molly Peters wrote: Patient called regarding annual wellness scheduled today as a telephone call at 3:40pm - noone has called her yet and she wants to make sure she will still be getting a call

## 2024-11-24 ENCOUNTER — Telehealth: Payer: Self-pay | Admitting: Pharmacist

## 2024-11-24 NOTE — Telephone Encounter (Signed)
 The form was not in the [facility] e-fax. Are you able to load it , again?

## 2024-11-24 NOTE — Progress Notes (Signed)
 Brief Telephone Documentation Reason for Call: Follow up on patient assistance eligibility   Summary of Call: Previously spoke to patient regarding Creon  PAP though she was unsure of total household income. Called patient today to follow up.   Patient confirms estimated total household income between herself and her spouse.  We discussed program eligibility for Creon  which we feel she would likely qualify for.   Total income us  just above criteria for Medicare LIS Extra Help.  Patient Assistance Program (PAP) Application   Manufacturer: [employer]    (New enrollment) Medication(s): Creon   Patient Portion of Application:  11/24/24: Messaged Patient Advocate team. Patient prefers application be mailed to her home.  Income Documentation: N/A - Electronic verification elected.  Provider Portion of Application:  11/24/24: Provider portion completed by PharmD and uploaded PCP eFax folder for signature. Clinical Pool/CMA notified.  Prescription(s): Included in PAP application.   Next Steps: [x]    Request sent to Patient Advocate team to mail patient an application per patient preference [x]    Provider portion of application filled out and uploaded to [facility] eFax folder for review/signature []    CMA: Upon PCP signature, Application to be faxed to [facility] PAP team AND scanned to chart: [facility] PAP Team: CPhT Patient Advocate Team Fax: [phone]  Note routed to PCP Clinic Pool to ensure PCP signature is obtained and application is faxed. Patient Advocate PAP Spreadsheet updated and Pool cc'd as FYI (application not submitted by clinic, Advocate team to fax to company once received)  [facility] clinic team - Please document as Next Steps are completed in office*

## 2024-11-28 ENCOUNTER — Other Ambulatory Visit (INDEPENDENT_AMBULATORY_CARE_PROVIDER_SITE_OTHER)

## 2024-11-28 DIAGNOSIS — E538 Deficiency of other specified B group vitamins: Secondary | ICD-10-CM | POA: Diagnosis not present

## 2024-11-28 DIAGNOSIS — D500 Iron deficiency anemia secondary to blood loss (chronic): Secondary | ICD-10-CM

## 2024-11-28 DIAGNOSIS — G40909 Epilepsy, unspecified, not intractable, without status epilepticus: Secondary | ICD-10-CM

## 2024-11-28 DIAGNOSIS — Z136 Encounter for screening for cardiovascular disorders: Secondary | ICD-10-CM | POA: Diagnosis not present

## 2024-11-28 DIAGNOSIS — Z113 Encounter for screening for infections with a predominantly sexual mode of transmission: Secondary | ICD-10-CM

## 2024-11-28 LAB — IBC + FERRITIN
Ferritin: 20.6 ng/mL (ref 10.0–291.0)
Iron: 72 ug/dL (ref 42–145)
Saturation Ratios: 15.2 % — ABNORMAL LOW (ref 20.0–50.0)
TIBC: 473.2 ug/dL — ABNORMAL HIGH (ref 250.0–450.0)
Transferrin: 338 mg/dL (ref 212.0–360.0)

## 2024-11-28 LAB — LIPID PANEL
Cholesterol: 163 mg/dL (ref 28–200)
HDL: 64.9 mg/dL
LDL Cholesterol: 87 mg/dL (ref 10–99)
NonHDL: 98.07
Total CHOL/HDL Ratio: 3
Triglycerides: 54 mg/dL (ref 10.0–149.0)
VLDL: 10.8 mg/dL (ref 0.0–40.0)

## 2024-11-28 LAB — VITAMIN B12: Vitamin B-12: 265 pg/mL (ref 211–911)

## 2024-11-28 NOTE — Addendum Note (Signed)
 Addended by: ISADORA RAISIN on: 11/28/2024 08:42 AM   Modules accepted: Orders

## 2024-11-29 ENCOUNTER — Other Ambulatory Visit: Payer: Self-pay

## 2024-11-29 DIAGNOSIS — F419 Anxiety disorder, unspecified: Secondary | ICD-10-CM

## 2024-11-29 DIAGNOSIS — F1320 Sedative, hypnotic or anxiolytic dependence, uncomplicated: Secondary | ICD-10-CM

## 2024-11-29 NOTE — Telephone Encounter (Signed)
 Copied from CRM #[id number]. Topic: Clinical - Medication Refill >> Nov 29, 2024  3:20 PM Zebedee SAUNDERS wrote: Medication: ALPRAZolam  (XANAX ) 1 MG tablet  Has the patient contacted their pharmacy? Yes (Agent: If no, request that the patient contact the pharmacy for the refill. If patient does not wish to contact the pharmacy document the reason why and proceed with request.) (Agent: If yes, when and what did the pharmacy advise?)Pharmacy need script  This is the patient's preferred pharmacy:  [facility] - [place], [place] - [address] [place] [address] Phone: [phone] Fax: [phone]  Is this the correct pharmacy for this prescription? Yes If no, delete pharmacy and type the correct one.   Has the prescription been filled recently? Yes  Is the patient out of the medication? Yes  Has the patient been seen for an appointment in the last year OR does the patient have an upcoming appointment? Yes  Can we respond through MyChart? Yes  Agent: Please be advised that Rx refills may take up to 3 business days. We ask that you follow-up with your pharmacy.

## 2024-11-29 NOTE — Telephone Encounter (Signed)
 Last filled 11-03-24 #60 Last OV 09-29-24 Next OV 12-05-24 [facility]

## 2024-12-01 LAB — HIV ANTIBODY (ROUTINE TESTING W REFLEX)
HIV 1&2 Ab, 4th Generation: NONREACTIVE
HIV FINAL INTERPRETATION: NEGATIVE

## 2024-12-01 LAB — LAMOTRIGINE LEVEL: Lamotrigine Lvl: 9.6 ug/mL (ref 2.5–15.0)

## 2024-12-01 LAB — HEPATITIS C ANTIBODY: Hepatitis C Ab: NONREACTIVE

## 2024-12-02 ENCOUNTER — Other Ambulatory Visit: Payer: Self-pay

## 2024-12-02 DIAGNOSIS — F1320 Sedative, hypnotic or anxiolytic dependence, uncomplicated: Secondary | ICD-10-CM

## 2024-12-02 DIAGNOSIS — F419 Anxiety disorder, unspecified: Secondary | ICD-10-CM

## 2024-12-03 ENCOUNTER — Ambulatory Visit: Payer: Self-pay

## 2024-12-03 DIAGNOSIS — E611 Iron deficiency: Secondary | ICD-10-CM | POA: Insufficient documentation

## 2024-12-03 MED ORDER — IRON (FERROUS SULFATE) 325 (65 FE) MG PO TABS: 325.0000 mg | ORAL_TABLET | ORAL | 3 refills | Status: DC

## 2024-12-03 MED ORDER — IRON (FERROUS SULFATE) 325 (65 FE) MG PO TABS: 325.0000 mg | ORAL_TABLET | ORAL | Status: DC

## 2024-12-05 ENCOUNTER — Telehealth: Payer: Self-pay

## 2024-12-05 ENCOUNTER — Encounter

## 2024-12-05 ENCOUNTER — Ambulatory Visit

## 2024-12-05 ENCOUNTER — Ambulatory Visit: Admission: RE | Admit: 2024-12-05 | Discharge: 2024-12-05 | Disposition: A | Source: Ambulatory Visit

## 2024-12-05 VITALS — BP 156/90 | HR 95 | Temp 98.0°F | Ht 60.0 in | Wt 88.4 lb

## 2024-12-05 DIAGNOSIS — Z0000 Encounter for general adult medical examination without abnormal findings: Secondary | ICD-10-CM

## 2024-12-05 DIAGNOSIS — Z681 Body mass index (BMI) 19 or less, adult: Secondary | ICD-10-CM | POA: Diagnosis not present

## 2024-12-05 DIAGNOSIS — Z0001 Encounter for general adult medical examination with abnormal findings: Secondary | ICD-10-CM | POA: Diagnosis not present

## 2024-12-05 DIAGNOSIS — K8681 Exocrine pancreatic insufficiency: Secondary | ICD-10-CM | POA: Diagnosis not present

## 2024-12-05 DIAGNOSIS — F1320 Sedative, hypnotic or anxiolytic dependence, uncomplicated: Secondary | ICD-10-CM

## 2024-12-05 DIAGNOSIS — E611 Iron deficiency: Secondary | ICD-10-CM | POA: Diagnosis not present

## 2024-12-05 DIAGNOSIS — G8929 Other chronic pain: Secondary | ICD-10-CM | POA: Diagnosis not present

## 2024-12-05 DIAGNOSIS — F419 Anxiety disorder, unspecified: Secondary | ICD-10-CM

## 2024-12-05 DIAGNOSIS — M25511 Pain in right shoulder: Secondary | ICD-10-CM

## 2024-12-05 DIAGNOSIS — F17200 Nicotine dependence, unspecified, uncomplicated: Secondary | ICD-10-CM | POA: Diagnosis not present

## 2024-12-05 DIAGNOSIS — E7849 Other hyperlipidemia: Secondary | ICD-10-CM

## 2024-12-05 DIAGNOSIS — Z23 Encounter for immunization: Secondary | ICD-10-CM

## 2024-12-05 DIAGNOSIS — R636 Underweight: Secondary | ICD-10-CM | POA: Diagnosis not present

## 2024-12-05 DIAGNOSIS — Z1231 Encounter for screening mammogram for malignant neoplasm of breast: Secondary | ICD-10-CM

## 2024-12-05 DIAGNOSIS — R0982 Postnasal drip: Secondary | ICD-10-CM

## 2024-12-05 DIAGNOSIS — Z1339 Encounter for screening examination for other mental health and behavioral disorders: Secondary | ICD-10-CM

## 2024-12-05 DIAGNOSIS — Z780 Asymptomatic menopausal state: Secondary | ICD-10-CM

## 2024-12-05 MED ORDER — FLUTICASONE PROPIONATE 50 MCG/ACT NA SUSP: 1.0000 | Freq: Every day | NASAL | 6 refills | Status: AC

## 2024-12-05 MED ORDER — KETOROLAC TROMETHAMINE 30 MG/ML IJ SOLN
30.0000 mg | Freq: Once | INTRAMUSCULAR | Status: AC
  Administered 2024-12-05: 30 mg via INTRAMUSCULAR

## 2024-12-05 MED ORDER — IRON (FERROUS SULFATE) 325 (65 FE) MG PO TABS: 325.0000 mg | ORAL_TABLET | ORAL | 3 refills | Status: AC

## 2024-12-05 MED ORDER — ALPRAZOLAM 0.5 MG PO TABS: 0.5000 mg | ORAL_TABLET | Freq: Two times a day (BID) | ORAL | Status: AC

## 2024-12-05 MED ORDER — ATORVASTATIN CALCIUM 20 MG PO TABS: 20.0000 mg | ORAL_TABLET | Freq: Every day | ORAL | 0 refills | Status: AC

## 2024-12-05 MED ORDER — ALPRAZOLAM 0.25 MG PO TABS: 0.2500 mg | ORAL_TABLET | Freq: Two times a day (BID) | ORAL | Status: AC

## 2024-12-05 NOTE — Patient Instructions (Addendum)
 Thank you for visiting  Healthcare today! Here's what we talked about: - We will START Xanax  0.75mg  twice daily for your next refill - No ibuprofen for the rest of today because of toradol  shot - Call  our clinic if you do not hear about mammogram, bone scan and Lung scan in 2 weeks  - Buy Boost or ensure and drink it twice a day, after lunch and after dinner - Get shingles and RSV and Tetanus shot from pharmacy

## 2024-12-05 NOTE — Progress Notes (Signed)
 "  Assessment & Plan:   This visit was conducted in person. The patient gave informed consent to the use of Abridge AI technology to record the contents of the encounter as documented below.  Assessment & Plan Physical Age-appropriate screening, counseling and vaccines discussed with patient. Healthy diet and exercise recommendations given.   I have personally reviewed and have noted: 1. The patient's medical and social history 2. Their use of alcohol, tobacco or illicit drugs 3. Their current medications and supplements 4. The patient's functional ability including ADL's, fall risks, home safety risks and hearing or visual impairment. 5. Diet and physical activities 6. Evidence for depression or mood disorder   Colonoscopy: Done in 2022, polyps precancerous, 44yr repeat recommended, due in 2029 Mammogram: Due, ordered DEXA: Ordered Pap: Has had hysterectomy LDCT:Due, ordered Labs:  UTD Immunizations: Declined flu, covid,  Diet and Exercise: Improving Sleep and mood: Okay ADLs and IADLs: Capable Cognitive: Intact to orientation, naming, recall and repetition Fall risk and home safety: No concerns Health counselling given: Healthy diet with supplements given BMI of 17  Advanced Directives Patient does not have advanced directives, see social history.  Chronic right shoulder pain Ongoing for several months, history of multiple falls, severe pain with radiation and numbness, physical exam reveals limited ROM, she has had history of replacement in that shoulder.  XR in the clinic did reveal significant displacement of right shoulder hardware, emergent referral to orthopedic surgery has been sent.  - Ordered right shoulder x-ray. - Administered Toradol  injection. - Advised against ibuprofen for the rest of the day.  Anxiety disorder with benzodiazepine dependence Anxiety controlled with Paxil .  Dose was recently increased to 30 mg and anxiety is reportedly better, plan to reduce  Xanax  dose for safety at her next refill to 0.75 mg twice daily, CSA updated, UDS submitted. - Continue current Xanax  dose until next refill. - Reduce Xanax  to 0.75 mg twice daily at next refill. - Send phone call reminder for dose change at next refill.  Exocrine pancreatic insufficiency with underweight BMI Weight remained consistently low between 88 and 90 pounds over the past 2 years, most likely due to untreated pancreatic insufficiency, she cannot afford Creon  and has been off it for the past few years, will follow-up on application for patient assistance program.  In the meantime, she will supplement diet with boost/Ensure.  - Discuss patient assistance programs for Creon  with pharmacy liaison. - Encouraged Ensure or Boost twice daily.  Iron  deficiency Low iron  levels on recent labs, sent supplement. - Resent prescription for iron  supplement, to be taken Monday, Wednesday, and Friday. - Advised taking iron  with food to minimize gastrointestinal side effects.  Other hyperlipidemia 11.9% 10-year ASCVD risk, discussed medication to reduce risk.  Agreeable to start Lipitor, will start at low-dose given her very low BMI and may uptitrate later - Prescribed low-dose cholesterol medication for 90 days.  Tobacco use disorder Long-term smoker, willing to reduce smoking.  Not agreeable to NRT or medication at this time. - Encouraged gradual reduction by 1-2 cigarettes per week. - Follow-up in 4 weeks to assess progress.  Post-nasal drip Chronic post-nasal drip likely due to allergies.  Will trial Flonase . - Prescribed Flonase  nasal spray, one spray in each nostril daily, increase to two sprays if no improvement after 5 days.   Follow-up: Return in about 4 weeks (around 01/02/2025) for R shoulder pain and smoking, then 1 year for CPE .        Subjective:  Patient ID: Molly Peters, female    DOB: Mar 12, 1961  Age: 64 y.o. MRN: [id number]  Patient Care Team: Bennett Reuben POUR, MD as  PCP - General (Family Medicine) Janalyn Keene NOVAK, MD (Inactive) as Consulting Physician (Gastroenterology) Nicholaus Selinda Birmingham, MD (Inactive) as Consulting Physician (General Surgery) Melanee Annah JAYSON, MD as Consulting Physician (Oncology)   CC:  Chief Complaint  Patient presents with   Annual Exam    Medicare Wellness Exam with Nurse was done 11-20-24 Has several things to discuss: Sleep issues from left shoulder pain    Molly Peters is a 64 y.o. female here for annual physical and f/u on chronic conditions, see assessment and plan for further details Acute complaint of R shoulder pain also addressed, see assessment and plan for further details  Discussed the use of AI scribe software for clinical note transcription with the patient, who gave verbal consent to proceed.  History of Present Illness Molly Peters is a 64 year old female who presents for medication management and evaluation of right shoulder pain.  Anxiety remains unchanged despite an increased dosage of Paxil . Initially, there was a noticeable improvement after the Paxil  dose increase. She is currently taking Paxil  and Xanax , with Xanax  at a dose of one tablet twice a day. She feels that Xanax  helps keep her calm, especially around her children.  Right shoulder pain began four to five months ago and has worsened over time. The pain sometimes radiates down her arm, causing numbness and a sensation of swelling. She experiences difficulty reaching up to cabinets and performing daily activities like washing her hair. She has been using ibuprofen 800 mg, Tylenol , Icy Hot patches, and Voltaren gel for pain relief, but finds them ineffective. She rates her pain as a 10 out of 10 daily.  She has a history of pancreatic insufficiency and has not been taking Creon  due to cost. She reports significant weight loss in the past, dropping to 70 pounds, but has since stabilized around 88 pounds. She does not eat breakfast but  consumes lunch and dinner, often including protein and carbohydrates. She has previously used nutritional supplements like Ensure to gain weight.  She has smoked since age 35 and currently smokes less than a pack a day. She has attempted to quit in the past, notably when planning to adopt a baby, but resumed smoking after the adoption fell through. She enjoys smoking and is hesitant about quitting.  Chronic sinus drainage is described as thick and constant, leading to coughing and nasal congestion. She is not currently on any allergy medication.  She has a history of iron  deficiency and reports eating green vegetables and beans. She has not yet started an iron  supplement as it was not available at the pharmacy.    Depression Screening;    12/05/2024   12:15 PM 11/20/2024    4:15 PM 09/29/2024    3:04 PM 09/23/2023    2:49 PM 09/23/2023    2:28 PM 09/21/2022   11:59 AM 06/30/2021    3:10 PM  PHQ 2/9 Scores  PHQ - 2 Score 6 0 0 1 4 0 0  PHQ- 9 Score 15 2   11         Data saved with a previous flowsheet row definition     Anxiety Screening:    12/05/2024   12:16 PM  GAD 7 : Generalized Anxiety Score  Nervous, Anxious, on Edge 3  Control/stop worrying 3  Worry too much - different  things 3  Trouble relaxing 3  Restless 3  Easily annoyed or irritable 0  Afraid - awful might happen 0  Total GAD 7 Score 15  Anxiety Difficulty Very difficult     ROS: Negative unless specifically indicated above in HPI.       Objective:     BP (!) 156/90 (BP Location: Right Arm, Patient Position: Sitting, Cuff Size: Normal)   Pulse 95   Temp 98 F (36.7 C) (Oral)   Ht 5' (1.524 m)   Wt 88 lb 6 oz (40.1 kg)   SpO2 97%   BMI 17.26 kg/m    Physical Exam MEASUREMENTS: Weight- 88. GENERAL: Alert, cooperative, well developed, no acute distress.  Severely underweight. HEAD: Normocephalic atraumatic. EYES: Extraocular movements intact BL, pupils round, equal and reactive to light BL,  conjunctivae normal BL. EARS: Tympanic membrane, ear canal and external ear normal BL. NOSE: No congestion or rhinorrhea, mucous membranes are moist. THROAT: No oropharyngeal exudate or posterior oropharyngeal erythema. CARDIOVASCULAR: Normal heart rate and rhythm, S1 and S2 normal without murmurs. CHEST: Clear to auscultation bilaterally, no wheezes, rhonchi, or crackles. Lungs with decent airflow, better than expected for emphysema. ABDOMEN: Soft, non tender, non distended, without organomegaly, normal bowel sounds, no abnormalities noted. EXTREMITIES: No cyanosis or edema. NEUROLOGICAL: Oriented to person, place and time, no gait abnormalities, moves all extremities without gross motor or sensory deficit. NECK: No abnormalities noted. MUSCULOSKELETAL: Limited range of motion in right shoulder        Reuben MARLA Burkes, MD  12/05/24    Contains text generated by Abridge AI Software.    "

## 2024-12-05 NOTE — Telephone Encounter (Signed)
 Called pt and discussed R shoulder XR findings, advised that x-ray shows right shoulder hardware is displaced, stat referral to orthopedic surgery has been sent, patient updated accordingly.

## 2024-12-06 ENCOUNTER — Telehealth: Payer: Self-pay

## 2024-12-06 ENCOUNTER — Other Ambulatory Visit (HOSPITAL_COMMUNITY): Payer: Self-pay

## 2024-12-06 ENCOUNTER — Encounter: Payer: Self-pay | Admitting: Urgent Care

## 2024-12-06 NOTE — Telephone Encounter (Signed)
 PAP: Patient assistance application for Creon  through [employer]) has been mailed to pt's home address on file. Provider portion of application will be faxed to provider's office.

## 2024-12-07 ENCOUNTER — Other Ambulatory Visit (HOSPITAL_COMMUNITY): Payer: Self-pay

## 2024-12-07 ENCOUNTER — Other Ambulatory Visit (HOSPITAL_BASED_OUTPATIENT_CLINIC_OR_DEPARTMENT_OTHER): Payer: Self-pay | Admitting: Orthopaedic Surgery

## 2024-12-07 ENCOUNTER — Ambulatory Visit (INDEPENDENT_AMBULATORY_CARE_PROVIDER_SITE_OTHER)

## 2024-12-07 VITALS — BP 138/84 | HR 90 | Ht 60.0 in | Wt 88.0 lb

## 2024-12-07 DIAGNOSIS — G8929 Other chronic pain: Secondary | ICD-10-CM

## 2024-12-07 DIAGNOSIS — M25511 Pain in right shoulder: Secondary | ICD-10-CM | POA: Diagnosis not present

## 2024-12-07 DIAGNOSIS — Z96619 Presence of unspecified artificial shoulder joint: Secondary | ICD-10-CM

## 2024-12-07 DIAGNOSIS — T84038A Mechanical loosening of other internal prosthetic joint, initial encounter: Secondary | ICD-10-CM

## 2024-12-07 LAB — DRUG MONITORING, PANEL 8 WITH CONFIRMATION, URINE
6 Acetylmorphine: NEGATIVE ng/mL
Alcohol Metabolites: POSITIVE ng/mL — AB
Alphahydroxyalprazolam: 915 ng/mL — ABNORMAL HIGH
Alphahydroxymidazolam: NEGATIVE ng/mL
Alphahydroxytriazolam: NEGATIVE ng/mL
Aminoclonazepam: NEGATIVE ng/mL
Amphetamines: NEGATIVE ng/mL
Benzodiazepines: POSITIVE ng/mL — AB
Buprenorphine, Urine: NEGATIVE ng/mL
Cocaine Metabolite: NEGATIVE ng/mL
Codeine: NEGATIVE ng/mL
Creatinine: 134.4 mg/dL
Ethyl Glucuronide (ETG): 98919 ng/mL — ABNORMAL HIGH
Ethyl Sulfate (ETS): 20604 ng/mL — ABNORMAL HIGH
Hydrocodone: NEGATIVE ng/mL
Hydromorphone: 1329 ng/mL — ABNORMAL HIGH
Hydroxyethylflurazepam: NEGATIVE ng/mL
Lorazepam: NEGATIVE ng/mL
MDMA: NEGATIVE ng/mL
Marijuana Metabolite: 40 ng/mL — ABNORMAL HIGH
Marijuana Metabolite: POSITIVE ng/mL — AB
Morphine: NEGATIVE ng/mL
Nordiazepam: NEGATIVE ng/mL
Norhydrocodone: NEGATIVE ng/mL
Opiates: POSITIVE ng/mL — AB
Oxazepam: NEGATIVE ng/mL
Oxidant: NEGATIVE ug/mL
Oxycodone: NEGATIVE ng/mL
Temazepam: NEGATIVE ng/mL
pH: 5.4 (ref 4.5–9.0)

## 2024-12-07 LAB — DM TEMPLATE

## 2024-12-07 NOTE — Progress Notes (Signed)
 "  Office Visit Note   Patient: Molly Peters           Date of Birth: 1960-12-30           MRN: [id number] Visit Date: 12/07/2024              Requested by: Bennett Reuben POUR, MD [address] [place],  [place] [address] PCP: Bennett Reuben POUR, MD   Assessment & Plan: Visit Diagnoses:  1. 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 Chronic right shoulder pain     Plan: Discussed with patient at length. Patient has loosening of glenoid component of her reverse total shoulder and will need revision. Has been referred to see Dr. Genelle for surgical evaluation. In the mean time have ordered CT scan to evaluate bone stock as well as inflammatory labs to rule out infection, both of which Dr. Genelle will review.  Orders:  Orders Placed This Encounter  Procedures   CT SHOULDER RIGHT WO CONTRAST   Ambulatory referral to Orthopedic Surgery     Subjective: Chief Complaint: Right shoulder pain  HPI Patient is a 64 y.o. female who complains of right shoulder pain. Onset of the symptoms was several years ago. Mechanism of injury: none. Immediate symptoms: pain and swelling. Symptoms have been worsening since that time. Prior history of related problems: previous shoulder injury: Previously had right shoulder replacement which failed, and had a reverse total shoulder replacement conversion 3 years ago. Patient reports that she cannot lift her arm over her head on the right side.  Objective: Vital Signs: BP 138/84   Pulse 90   Ht 5' (1.524 m)   Wt 39.9 kg   BMI 17.19 kg/m   Physical Exam Gen: Alert, No Acute Distress right shoulder: Skin intact, no erythema or induration noted. There is swelling and TTP of the anterior glenohumeral joint. Abduction to 60 degrees. ROM deferred. NVID  Imaging: Radiographs personally reviewed by me; reveal loosening of the glenoid component of a reverse total shoulder arthroplasty of the right shoulder   PMFS  History: Patient Active Problem List   Diagnosis Date Noted   Iron  deficiency 12/03/2024   Anxiety 09/29/2024   Exocrine pancreatic insufficiency 09/29/2024   Urticaria 10/10/2021   Benzodiazepine dependence (HCC) 06/30/2021   Malnutrition of mild degree 06/25/2020   History of colonic polyps    Diverticulosis of large intestine without diverticulitis    Pulmonary nodules 08/08/2018   B12 deficiency 07/21/2018   Iron  deficiency anemia due to chronic blood loss 07/05/2018   Polyp of colon    Acute peptic ulcer of stomach    Advance directive discussed with patient 10/27/2017   Neuropathy 08/11/2016   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) 02/02/2016   Hx of cervical spine surgery (Left ACDF) 02/02/2016   Marijuana use 01/29/2016   Osteoarthritis of shoulder (Left) 07/05/2015   S/P Bilateral Total Shoulder Replacement (2016) 11/23/2014   Osteoarthritis of shoulder (Right) 04/13/2014   Restless legs syndrome (RLS) 10/21/2010   Essential hypertension, benign 06/23/2010   HYPERLIPIDEMIA-MIXED 10/23/2009   Valvular heart disease 03/27/2009   IRRITABLE BOWEL SYNDROME 11/13/2008   Tobacco use disorder 09/07/2007   Unspecified glaucoma 09/07/2007   Esophagogastric ulcer 09/07/2007   Depression, recurrent 08/08/2007   COPD (chronic obstructive pulmonary disease) with chronic bronchitis (HCC) 08/08/2007   GERD 08/08/2007   Seizure disorder (HCC) 08/08/2007   Past Medical History:  Diagnosis Date   Anxiety  Cervical cancer (HCC) 1980's   Chronic back pain 11/12/2009   Qualifier: Diagnosis of  By: Baird FNP, Frieda Kipper    COPD (chronic obstructive pulmonary disease) (HCC)    states SOB with ADLs; no O2 use; able to speak in complete sentences without SOB(11/15/2014)   Depression    Difficulty swallowing pills    s/p cervical fusion   Family history of adverse reaction to anesthesia    pt's mother and sister have hx. of post-op N/V   Fibromyalgia     GERD (gastroesophageal reflux disease)    Heart murmur    History of gastric ulcer    Hypertension    states under control with med., has been on med. x 1-2 yr.   IBS (irritable bowel syndrome)    Internal hemorrhoid    states has had intermittent bright red bleeding with BM (11/15/2014)   Intraductal papilloma of breast, right 08/08/2018   Limited joint range of motion    neck - s/p cervical fusion   Localized primary osteoarthritis of right shoulder region 11/23/2014   Osteoarthritis of left shoulder 07/05/2015   Osteoarthritis of right shoulder 11/2014   Pneumonia    Seizures (HCC)    last seizure 2010   Short-term memory loss    TMJ syndrome    Valvular heart disease    Initial workup a number of years ago at [facility].  Echo (10/2009) showed EF 60-65%,      normal LV size, moderate aortic insufficiency with a trileaflet aortic valve and normal aortic root size.  Mild      mitral regurgitation.    Wears dentures    lower    Family History  Problem Relation Age of Onset   Cervical cancer Mother    Anesthesia problems Mother        post-op N/V   Rheum arthritis Mother    Anxiety disorder Mother    Cancer Father        colon cancer   Migraines Sister    Cervical cancer Sister    Anesthesia problems Sister        post-op N/V   Migraines Brother    Asthma Daughter    Cerebral palsy Daughter        age 51   Breast cancer Maternal Aunt    Breast cancer Maternal Aunt    Breast cancer Maternal Aunt    Colon cancer Paternal Uncle    Lung cancer Paternal Uncle    Coronary artery disease Maternal Grandmother    Hypertension Maternal Grandmother    Diabetes Maternal Grandmother    Coronary artery disease Paternal Grandmother    Hypertension Paternal Grandmother    Diabetes Paternal Grandmother     Past Surgical History:  Procedure Laterality Date   ABDOMINAL HYSTERECTOMY     partial, removed cervix   ANTERIOR CERVICAL DECOMP/DISCECTOMY FUSION  02/06/2011   C4-5, C5-6, C6-7    BREAST BIOPSY Bilateral 10+ yrs ago   neg   BREAST BIOPSY Right 08/03/2018   2 areas bx, -neg   BREAST LUMPECTOMY Bilateral 1989   x 3 - benign   BREAST LUMPECTOMY Right 08/24/2018   Procedure: BREAST LUMPECTOMY WITH ULTRASOUND IN OR;  Surgeon: Nicholaus Selinda Birmingham, MD;  Location: [facility];  Service: General;  Laterality: Right;   CARDIAC CATHETERIZATION  1995   CESAREAN SECTION     x 2   COLONOSCOPY  09/28/2008   COLONOSCOPY N/A 06/15/2018   Procedure: COLONOSCOPY;  Surgeon: Janalyn Keene NOVAK,  MD;  Location: [facility];  Service: Endoscopy;  Laterality: N/A;   COLONOSCOPY WITH PROPOFOL  N/A 05/04/2019   Procedure: COLONOSCOPY WITH PROPOFOL ;  Surgeon: Janalyn Keene NOVAK, MD;  Location: [facility];  Service: Endoscopy;  Laterality: N/A;   COLONOSCOPY WITH PROPOFOL  N/A 04/21/2021   Procedure: COLONOSCOPY WITH PROPOFOL ;  Surgeon: Janalyn Keene NOVAK, MD;  Location: [facility];  Service: Endoscopy;  Laterality: N/A;   ESOPHAGOGASTRODUODENOSCOPY  09/28/2008   ESOPHAGOGASTRODUODENOSCOPY N/A 06/14/2018   Procedure: ESOPHAGOGASTRODUODENOSCOPY (EGD);  Surgeon: Janalyn Keene NOVAK, MD;  Location: [facility] ENDOSCOPY;  Service: Endoscopy;  Laterality: N/A;   ESOPHAGOGASTRODUODENOSCOPY (EGD) WITH PROPOFOL  N/A 05/04/2019   Procedure: ESOPHAGOGASTRODUODENOSCOPY (EGD) WITH PROPOFOL ;  Surgeon: Janalyn Keene NOVAK, MD;  Location: [facility];  Service: Endoscopy;  Laterality: N/A;   ESOPHAGOGASTRODUODENOSCOPY (EGD) WITH PROPOFOL  N/A 03/18/2020   Procedure: ESOPHAGOGASTRODUODENOSCOPY (EGD) WITH PROPOFOL ;  Surgeon: Unk Corinn Skiff, MD;  Location: [facility];  Service: Gastroenterology;  Laterality: N/A;   ESOPHAGOGASTRODUODENOSCOPY (EGD) WITH PROPOFOL  N/A 04/21/2021   Procedure: ESOPHAGOGASTRODUODENOSCOPY (EGD) WITH PROPOFOL ;  Surgeon: Janalyn Keene NOVAK, MD;  Location: [facility];  Service: Endoscopy;  Laterality: N/A;   HARDWARE REMOVAL  11/17/2006   L5-S1   LAMINECTOMY WITH  POSTERIOR LATERAL ARTHRODESIS LEVEL 3  12/06/2009   with synovial cyst resection L3-4 bilat.   LAMINECTOMY WITH POSTERIOR LATERAL ARTHRODESIS LEVEL 3  11/17/2006   L4-5   NM MYOVIEW  LTD     Lexiscan myoview  (10/2009): EF 77%, normal wall motion, normal perfusion.    SHOULDER ARTHROSCOPY WITH DEBRIDEMENT AND BICEP TENDON REPAIR Right 04/13/2014   Procedure: RIGHT SHOULDER ARTHROSCOPY WITH DEBRIDEMENT EXTENSIVE;  Surgeon: Fonda SHAUNNA Olmsted, MD;  Location: [facility];  Service: Orthopedics;  Laterality: Right;   TOTAL SHOULDER ARTHROPLASTY Right 11/23/2014   Procedure: TOTAL RIGHT SHOULDER ARTHROPLASTY;  Surgeon: Fonda SHAUNNA Olmsted, MD;  Location:  SURGERY CENTER;  Service: Orthopedics;  Laterality: Right;   TOTAL SHOULDER ARTHROPLASTY Left 07/05/2015   Procedure: LEFT TOTAL SHOULDER REPLACEMENT;  Surgeon: Fonda Olmsted, MD;  Location:  SURGERY CENTER;  Service: Orthopedics;  Laterality: Left;   Social History   Occupational History   Occupation: Disabled 2003  Tobacco Use   Smoking status: Every Day    Current packs/day: 1.00    Average packs/day: 1 pack/day for 42.8 years (42.8 ttl pk-yrs)    Types: Cigarettes    Passive exposure: Never   Smokeless tobacco: Never  Vaping Use   Vaping status: Never Used  Substance and Sexual Activity   Alcohol use: Yes    Alcohol/week: 0.0 standard drinks of alcohol    Comment: rare   Drug use: Yes    Types: Marijuana    Comment: marijuana 2 days ago   Sexual activity: Not on file   Current Outpatient Medications  Medication Instructions   albuterol  (VENTOLIN  HFA) 108 (90 Base) MCG/ACT inhaler INHALE 2 PUFFS BY MOUTH EVERY 6 HOURS ASNEEDED WHEEEZING/ SHORTNESS OF BREATH   ALPRAZolam  (XANAX ) 0.5 mg, Oral, 2 times daily   ALPRAZolam  (XANAX ) 0.25 mg, Oral, 2 times daily   atorvastatin  (LIPITOR) 20 mg, Oral, Daily   fluticasone  (FLONASE ) 50 MCG/ACT nasal spray 1 spray, Each Nare, Daily   Iron  (Ferrous Sulfate ) 325 mg,  Oral, Every M-W-F   lamoTRIgine  (LAMICTAL ) 100 mg, Oral, 2 times daily   losartan -hydrochlorothiazide  (HYZAAR) 100-12.5 MG tablet 1 tablet, Oral, Daily   ondansetron  (ZOFRAN -ODT) 4 MG disintegrating tablet DISSOLVE 1 TABLET ON THE TONGUE EVERY 8 HOURS AS NEEDED   PARoxetine  (PAXIL )  30 mg, Oral, Daily   Allergies as of 12/07/2024 - Review Complete 12/05/2024  Allergen Reaction Noted   Carbamazepine Hives 08/26/2015   Divalproex sodium Swelling and Other (See Comments) 01/01/2016   Clonazepam Other (See Comments) 08/12/2005   Diphenhydramine hcl Rash 08/26/2015   Tiotropium bromide monohydrate  Other (See Comments)    Vicodin [hydrocodone-acetaminophen ] Itching 11/15/2014   "

## 2024-12-07 NOTE — Telephone Encounter (Signed)
 Faxed form and received fax confirmation that it had gone through.

## 2024-12-07 NOTE — Telephone Encounter (Signed)
 I have the provider form ready. Where does it need to be sent?

## 2024-12-08 LAB — SEDIMENTATION RATE: Sed Rate: 6 mm/h (ref 0–40)

## 2024-12-08 LAB — C-REACTIVE PROTEIN: CRP: <1 mg/L (ref 0–10)

## 2024-12-10 ENCOUNTER — Ambulatory Visit: Payer: Self-pay

## 2024-12-12 ENCOUNTER — Encounter: Payer: Self-pay | Admitting: Urgent Care

## 2024-12-13 ENCOUNTER — Telehealth: Payer: Self-pay

## 2024-12-13 ENCOUNTER — Other Ambulatory Visit: Payer: Self-pay

## 2024-12-13 NOTE — Telephone Encounter (Signed)
 Molly Peters

## 2024-12-13 NOTE — Telephone Encounter (Signed)
 Called patient to discuss UDS results which included very high levels of alcohol and opioids, none of which she is prescribed.  Explained to patient that both of these are a breach of her controlled substance use agreement and for this reason I will no longer continue actively prescribing Xanax .  Given that patient has been on it daily for years, we will refill her next Xanax  prescription with specific gradual taper every 2 weeks until eventually discontinuing. Patient verbalized understanding.

## 2024-12-15 ENCOUNTER — Other Ambulatory Visit

## 2024-12-15 NOTE — Telephone Encounter (Incomplete)
 Provider portion received and indexed to patient media. Still awaiting patient portion

## 2025-01-03 ENCOUNTER — Encounter (HOSPITAL_BASED_OUTPATIENT_CLINIC_OR_DEPARTMENT_OTHER): Admitting: Orthopaedic Surgery

## 2025-01-05 ENCOUNTER — Ambulatory Visit
